# Patient Record
Sex: Female | Born: 1940
Health system: Southern US, Community
[De-identification: ages and names within clinical notes are randomized; demographics above are authoritative.]

## PROBLEM LIST (undated history)

## (undated) ENCOUNTER — Emergency Department: Admission: EM | Discharge status: Absent without leave | Payer: Medicare PPO

## (undated) DIAGNOSIS — N6019 Diffuse cystic mastopathy of unspecified breast: Secondary | ICD-10-CM

## (undated) DIAGNOSIS — Z803 Family history of malignant neoplasm of breast: Secondary | ICD-10-CM

## (undated) DIAGNOSIS — N189 Chronic kidney disease, unspecified: Secondary | ICD-10-CM

## (undated) DIAGNOSIS — E785 Hyperlipidemia, unspecified: Secondary | ICD-10-CM

## (undated) DIAGNOSIS — G629 Polyneuropathy, unspecified: Secondary | ICD-10-CM

## (undated) DIAGNOSIS — R011 Cardiac murmur, unspecified: Secondary | ICD-10-CM

## (undated) DIAGNOSIS — M810 Age-related osteoporosis without current pathological fracture: Secondary | ICD-10-CM

## (undated) DIAGNOSIS — Z87442 Personal history of urinary calculi: Secondary | ICD-10-CM

## (undated) DIAGNOSIS — T8859XA Other complications of anesthesia, initial encounter: Secondary | ICD-10-CM

## (undated) DIAGNOSIS — I341 Nonrheumatic mitral (valve) prolapse: Secondary | ICD-10-CM

## (undated) DIAGNOSIS — R112 Nausea with vomiting, unspecified: Secondary | ICD-10-CM

## (undated) DIAGNOSIS — Z808 Family history of malignant neoplasm of other organs or systems: Secondary | ICD-10-CM

## (undated) DIAGNOSIS — Z8042 Family history of malignant neoplasm of prostate: Secondary | ICD-10-CM

## (undated) DIAGNOSIS — I499 Cardiac arrhythmia, unspecified: Secondary | ICD-10-CM

## (undated) DIAGNOSIS — M545 Low back pain, unspecified: Secondary | ICD-10-CM

## (undated) DIAGNOSIS — K589 Irritable bowel syndrome without diarrhea: Secondary | ICD-10-CM

## (undated) DIAGNOSIS — M19019 Primary osteoarthritis, unspecified shoulder: Secondary | ICD-10-CM

## (undated) DIAGNOSIS — C801 Malignant (primary) neoplasm, unspecified: Secondary | ICD-10-CM

## (undated) DIAGNOSIS — Z95810 Presence of automatic (implantable) cardiac defibrillator: Secondary | ICD-10-CM

## (undated) DIAGNOSIS — R519 Headache, unspecified: Secondary | ICD-10-CM

## (undated) DIAGNOSIS — Z9889 Other specified postprocedural states: Secondary | ICD-10-CM

## (undated) DIAGNOSIS — Z800 Family history of malignant neoplasm of digestive organs: Secondary | ICD-10-CM

## (undated) DIAGNOSIS — I10 Essential (primary) hypertension: Secondary | ICD-10-CM

## (undated) DIAGNOSIS — I4891 Unspecified atrial fibrillation: Secondary | ICD-10-CM

## (undated) DIAGNOSIS — G4733 Obstructive sleep apnea (adult) (pediatric): Secondary | ICD-10-CM

## (undated) DIAGNOSIS — K219 Gastro-esophageal reflux disease without esophagitis: Secondary | ICD-10-CM

## (undated) DIAGNOSIS — G8929 Other chronic pain: Secondary | ICD-10-CM

## (undated) HISTORY — DX: Gastro-esophageal reflux disease without esophagitis: K21.9

## (undated) HISTORY — PX: ABDOMINAL HYSTERECTOMY: SHX81

## (undated) HISTORY — DX: Family history of malignant neoplasm of other organs or systems: Z80.8

## (undated) HISTORY — DX: Polyneuropathy, unspecified: G62.9

## (undated) HISTORY — DX: Presence of automatic (implantable) cardiac defibrillator: Z95.810

## (undated) HISTORY — DX: Diffuse cystic mastopathy of unspecified breast: N60.19

## (undated) HISTORY — DX: Family history of malignant neoplasm of prostate: Z80.42

## (undated) HISTORY — DX: Family history of malignant neoplasm of breast: Z80.3

## (undated) HISTORY — DX: Hyperlipidemia, unspecified: E78.5

## (undated) HISTORY — DX: Essential (primary) hypertension: I10

## (undated) HISTORY — DX: Chronic kidney disease, unspecified: N18.9

## (undated) HISTORY — DX: Irritable bowel syndrome, unspecified: K58.9

## (undated) HISTORY — PX: CARDIAC ELECTROPHYSIOLOGY MAPPING AND ABLATION: SHX1292

## (undated) HISTORY — DX: Age-related osteoporosis without current pathological fracture: M81.0

## (undated) HISTORY — DX: Obstructive sleep apnea (adult) (pediatric): G47.33

## (undated) HISTORY — PX: EYE SURGERY: SHX253

## (undated) HISTORY — DX: Low back pain, unspecified: M54.50

## (undated) HISTORY — PX: TOTAL HIP ARTHROPLASTY: SHX124

## (undated) HISTORY — DX: Other chronic pain: G89.29

## (undated) HISTORY — DX: Primary osteoarthritis, unspecified shoulder: M19.019

## (undated) HISTORY — PX: BACK SURGERY: SHX140

## (undated) HISTORY — DX: Family history of malignant neoplasm of digestive organs: Z80.0

## (undated) HISTORY — PX: HYSTEROTOMY: SHX1776

---
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
## 1999-01-16 ENCOUNTER — Encounter: Admission: RE | Admit: 1999-01-16 | Discharge: 1999-04-16 | Payer: Self-pay | Admitting: Anesthesiology

## 1999-10-05 HISTORY — PX: BREAST CYST ASPIRATION: SHX578

## 2004-07-07 ENCOUNTER — Encounter: Payer: Self-pay | Admitting: Pain Medicine

## 2004-07-21 ENCOUNTER — Ambulatory Visit: Payer: Self-pay | Admitting: Physician Assistant

## 2004-07-30 ENCOUNTER — Ambulatory Visit: Payer: Self-pay | Admitting: Pain Medicine

## 2004-08-04 ENCOUNTER — Encounter: Payer: Self-pay | Admitting: Pain Medicine

## 2004-08-20 ENCOUNTER — Ambulatory Visit: Payer: Self-pay | Admitting: Physician Assistant

## 2004-09-22 ENCOUNTER — Ambulatory Visit: Payer: Self-pay | Admitting: Pain Medicine

## 2004-10-13 ENCOUNTER — Ambulatory Visit: Payer: Self-pay | Admitting: Physician Assistant

## 2004-11-19 ENCOUNTER — Ambulatory Visit: Payer: Self-pay | Admitting: Internal Medicine

## 2004-11-19 ENCOUNTER — Ambulatory Visit: Payer: Self-pay | Admitting: Physician Assistant

## 2004-11-23 ENCOUNTER — Encounter: Payer: Self-pay | Admitting: Physician Assistant

## 2004-12-02 ENCOUNTER — Encounter: Payer: Self-pay | Admitting: Physician Assistant

## 2004-12-15 ENCOUNTER — Ambulatory Visit: Payer: Self-pay | Admitting: Physician Assistant

## 2005-01-13 ENCOUNTER — Ambulatory Visit: Payer: Self-pay | Admitting: Physician Assistant

## 2005-02-16 ENCOUNTER — Ambulatory Visit: Payer: Self-pay | Admitting: Physician Assistant

## 2005-03-18 ENCOUNTER — Ambulatory Visit: Payer: Self-pay | Admitting: Physician Assistant

## 2005-03-22 ENCOUNTER — Ambulatory Visit: Payer: Self-pay | Admitting: Unknown Physician Specialty

## 2005-04-14 ENCOUNTER — Ambulatory Visit: Payer: Self-pay | Admitting: Physician Assistant

## 2005-05-12 ENCOUNTER — Ambulatory Visit: Payer: Self-pay | Admitting: Unknown Physician Specialty

## 2005-05-14 ENCOUNTER — Ambulatory Visit: Payer: Self-pay | Admitting: Physician Assistant

## 2005-06-09 ENCOUNTER — Ambulatory Visit: Payer: Self-pay | Admitting: Physician Assistant

## 2005-07-12 ENCOUNTER — Ambulatory Visit: Payer: Self-pay | Admitting: Physician Assistant

## 2005-08-12 ENCOUNTER — Ambulatory Visit: Payer: Self-pay | Admitting: Physician Assistant

## 2005-09-10 ENCOUNTER — Ambulatory Visit: Payer: Self-pay | Admitting: Physician Assistant

## 2005-10-11 ENCOUNTER — Ambulatory Visit: Payer: Self-pay | Admitting: Physician Assistant

## 2005-11-08 ENCOUNTER — Ambulatory Visit: Payer: Self-pay | Admitting: Physician Assistant

## 2005-12-07 ENCOUNTER — Ambulatory Visit: Payer: Self-pay | Admitting: Physician Assistant

## 2005-12-08 ENCOUNTER — Ambulatory Visit: Payer: Self-pay | Admitting: Cardiology

## 2005-12-21 ENCOUNTER — Ambulatory Visit: Payer: Self-pay | Admitting: Internal Medicine

## 2006-01-04 ENCOUNTER — Ambulatory Visit: Payer: Self-pay | Admitting: Physician Assistant

## 2006-02-03 ENCOUNTER — Ambulatory Visit: Payer: Self-pay | Admitting: Physician Assistant

## 2006-03-07 ENCOUNTER — Ambulatory Visit: Payer: Self-pay | Admitting: Physician Assistant

## 2006-04-07 ENCOUNTER — Ambulatory Visit: Payer: Self-pay | Admitting: Physician Assistant

## 2006-05-05 ENCOUNTER — Ambulatory Visit: Payer: Self-pay | Admitting: Physician Assistant

## 2006-05-24 ENCOUNTER — Ambulatory Visit: Payer: Self-pay | Admitting: Physician Assistant

## 2006-06-20 ENCOUNTER — Ambulatory Visit: Payer: Self-pay | Admitting: Physician Assistant

## 2006-07-19 ENCOUNTER — Ambulatory Visit: Payer: Self-pay | Admitting: Physician Assistant

## 2006-08-18 ENCOUNTER — Ambulatory Visit: Payer: Self-pay | Admitting: Physician Assistant

## 2006-09-14 ENCOUNTER — Ambulatory Visit: Payer: Self-pay | Admitting: Physician Assistant

## 2006-10-17 ENCOUNTER — Ambulatory Visit: Payer: Self-pay | Admitting: Physician Assistant

## 2006-11-16 ENCOUNTER — Ambulatory Visit: Payer: Self-pay | Admitting: Physician Assistant

## 2006-12-15 ENCOUNTER — Ambulatory Visit: Payer: Self-pay | Admitting: Physician Assistant

## 2006-12-27 ENCOUNTER — Ambulatory Visit: Payer: Self-pay | Admitting: Internal Medicine

## 2007-01-13 ENCOUNTER — Ambulatory Visit: Payer: Self-pay | Admitting: Physician Assistant

## 2007-02-15 ENCOUNTER — Ambulatory Visit: Payer: Self-pay | Admitting: Physician Assistant

## 2007-03-13 ENCOUNTER — Ambulatory Visit: Payer: Self-pay | Admitting: Physician Assistant

## 2007-04-17 ENCOUNTER — Ambulatory Visit: Payer: Self-pay | Admitting: Physician Assistant

## 2007-05-16 ENCOUNTER — Ambulatory Visit: Payer: Self-pay | Admitting: Physician Assistant

## 2007-06-14 ENCOUNTER — Ambulatory Visit: Payer: Self-pay | Admitting: Physician Assistant

## 2007-07-11 ENCOUNTER — Ambulatory Visit: Payer: Self-pay | Admitting: Physician Assistant

## 2007-08-14 ENCOUNTER — Ambulatory Visit: Payer: Self-pay | Admitting: Physician Assistant

## 2007-09-14 ENCOUNTER — Ambulatory Visit: Payer: Self-pay | Admitting: Physician Assistant

## 2007-12-14 ENCOUNTER — Ambulatory Visit: Payer: Self-pay | Admitting: Physician Assistant

## 2008-01-16 ENCOUNTER — Ambulatory Visit: Payer: Self-pay | Admitting: Gastroenterology

## 2008-01-23 ENCOUNTER — Ambulatory Visit: Payer: Self-pay | Admitting: Internal Medicine

## 2008-03-12 ENCOUNTER — Ambulatory Visit: Payer: Self-pay | Admitting: Pain Medicine

## 2008-03-12 ENCOUNTER — Ambulatory Visit: Payer: Self-pay | Admitting: Physician Assistant

## 2008-04-02 ENCOUNTER — Ambulatory Visit: Payer: Self-pay | Admitting: Physician Assistant

## 2008-04-09 ENCOUNTER — Ambulatory Visit: Payer: Self-pay | Admitting: Pain Medicine

## 2008-04-29 ENCOUNTER — Ambulatory Visit: Payer: Self-pay | Admitting: Pain Medicine

## 2008-05-02 ENCOUNTER — Ambulatory Visit: Payer: Self-pay | Admitting: Pain Medicine

## 2008-05-06 ENCOUNTER — Ambulatory Visit: Payer: Self-pay | Admitting: Pain Medicine

## 2008-05-20 ENCOUNTER — Other Ambulatory Visit: Payer: Self-pay

## 2008-05-20 ENCOUNTER — Ambulatory Visit: Payer: Self-pay | Admitting: Ophthalmology

## 2008-05-27 ENCOUNTER — Ambulatory Visit: Payer: Self-pay | Admitting: Pain Medicine

## 2008-05-28 ENCOUNTER — Ambulatory Visit: Payer: Self-pay | Admitting: Ophthalmology

## 2008-06-19 ENCOUNTER — Ambulatory Visit: Payer: Self-pay | Admitting: Internal Medicine

## 2008-09-03 ENCOUNTER — Ambulatory Visit: Payer: Self-pay | Admitting: Physician Assistant

## 2008-11-08 ENCOUNTER — Ambulatory Visit: Payer: Self-pay | Admitting: Cardiovascular Disease

## 2008-12-05 ENCOUNTER — Ambulatory Visit: Payer: Self-pay | Admitting: Physician Assistant

## 2008-12-18 ENCOUNTER — Ambulatory Visit: Payer: Self-pay | Admitting: Internal Medicine

## 2009-01-27 ENCOUNTER — Ambulatory Visit: Payer: Self-pay | Admitting: Internal Medicine

## 2009-03-06 ENCOUNTER — Ambulatory Visit: Payer: Self-pay | Admitting: Physician Assistant

## 2009-06-05 ENCOUNTER — Ambulatory Visit: Payer: Self-pay | Admitting: Physician Assistant

## 2009-06-25 ENCOUNTER — Ambulatory Visit: Payer: Self-pay | Admitting: Physician Assistant

## 2009-07-01 ENCOUNTER — Ambulatory Visit: Payer: Self-pay | Admitting: Pain Medicine

## 2009-07-24 ENCOUNTER — Ambulatory Visit: Payer: Self-pay | Admitting: Physician Assistant

## 2009-09-15 ENCOUNTER — Ambulatory Visit: Payer: Self-pay | Admitting: General Practice

## 2009-09-15 ENCOUNTER — Ambulatory Visit: Payer: Self-pay | Admitting: Cardiovascular Disease

## 2009-09-29 ENCOUNTER — Inpatient Hospital Stay: Payer: Self-pay | Admitting: General Practice

## 2009-11-05 ENCOUNTER — Ambulatory Visit: Payer: Self-pay | Admitting: Pain Medicine

## 2010-01-29 ENCOUNTER — Ambulatory Visit: Payer: Self-pay | Admitting: Internal Medicine

## 2010-02-05 ENCOUNTER — Ambulatory Visit: Payer: Self-pay | Admitting: Pain Medicine

## 2010-12-09 ENCOUNTER — Ambulatory Visit: Payer: Self-pay | Admitting: Gastroenterology

## 2011-01-15 ENCOUNTER — Ambulatory Visit: Payer: Self-pay | Admitting: Internal Medicine

## 2011-09-09 ENCOUNTER — Ambulatory Visit: Payer: Self-pay | Admitting: Internal Medicine

## 2011-10-14 ENCOUNTER — Ambulatory Visit: Payer: Self-pay | Admitting: Internal Medicine

## 2012-01-25 ENCOUNTER — Ambulatory Visit: Payer: Self-pay | Admitting: Internal Medicine

## 2012-03-20 ENCOUNTER — Ambulatory Visit: Payer: Self-pay | Admitting: Ophthalmology

## 2012-03-20 LAB — POTASSIUM: Potassium: 4.1 mmol/L (ref 3.5–5.1)

## 2012-04-04 ENCOUNTER — Ambulatory Visit: Payer: Self-pay | Admitting: Ophthalmology

## 2012-04-19 DIAGNOSIS — I10 Essential (primary) hypertension: Secondary | ICD-10-CM

## 2012-07-14 ENCOUNTER — Encounter: Payer: Self-pay | Admitting: Neurology

## 2012-08-04 ENCOUNTER — Encounter: Payer: Self-pay | Admitting: Neurology

## 2013-01-25 ENCOUNTER — Ambulatory Visit: Payer: Self-pay | Admitting: Internal Medicine

## 2013-02-08 ENCOUNTER — Ambulatory Visit: Payer: Self-pay | Admitting: Registered Nurse

## 2013-02-08 LAB — CREATININE, SERUM: EGFR (Non-African Amer.): >60

## 2013-02-21 DIAGNOSIS — C4360 Malignant melanoma of unspecified upper limb, including shoulder: Secondary | ICD-10-CM | POA: Insufficient documentation

## 2014-01-08 DIAGNOSIS — G8929 Other chronic pain: Secondary | ICD-10-CM | POA: Insufficient documentation

## 2014-01-08 DIAGNOSIS — M169 Osteoarthritis of hip, unspecified: Secondary | ICD-10-CM | POA: Insufficient documentation

## 2014-01-08 DIAGNOSIS — M545 Low back pain: Secondary | ICD-10-CM

## 2014-01-08 DIAGNOSIS — M1610 Unilateral primary osteoarthritis, unspecified hip: Secondary | ICD-10-CM | POA: Insufficient documentation

## 2014-01-28 ENCOUNTER — Ambulatory Visit: Payer: Self-pay | Admitting: Internal Medicine

## 2014-02-28 ENCOUNTER — Ambulatory Visit: Payer: Self-pay | Admitting: Gastroenterology

## 2014-03-01 LAB — PATHOLOGY REPORT

## 2014-03-13 DIAGNOSIS — G8929 Other chronic pain: Secondary | ICD-10-CM | POA: Insufficient documentation

## 2014-03-13 DIAGNOSIS — R51 Headache: Secondary | ICD-10-CM

## 2014-03-13 DIAGNOSIS — R200 Anesthesia of skin: Secondary | ICD-10-CM | POA: Insufficient documentation

## 2014-03-13 DIAGNOSIS — R202 Paresthesia of skin: Secondary | ICD-10-CM

## 2014-03-13 DIAGNOSIS — M79604 Pain in right leg: Secondary | ICD-10-CM

## 2014-03-13 DIAGNOSIS — M79605 Pain in left leg: Secondary | ICD-10-CM

## 2014-03-13 DIAGNOSIS — G4486 Cervicogenic headache: Secondary | ICD-10-CM | POA: Insufficient documentation

## 2014-07-18 DIAGNOSIS — E669 Obesity, unspecified: Secondary | ICD-10-CM | POA: Insufficient documentation

## 2014-07-18 DIAGNOSIS — G44229 Chronic tension-type headache, not intractable: Secondary | ICD-10-CM | POA: Insufficient documentation

## 2014-07-18 DIAGNOSIS — R262 Difficulty in walking, not elsewhere classified: Secondary | ICD-10-CM | POA: Insufficient documentation

## 2014-07-18 DIAGNOSIS — G629 Polyneuropathy, unspecified: Secondary | ICD-10-CM | POA: Insufficient documentation

## 2015-01-17 ENCOUNTER — Other Ambulatory Visit: Payer: Self-pay | Admitting: Nurse Practitioner

## 2015-01-17 ENCOUNTER — Other Ambulatory Visit (HOSPITAL_BASED_OUTPATIENT_CLINIC_OR_DEPARTMENT_OTHER): Payer: Self-pay | Admitting: Nurse Practitioner

## 2015-01-17 DIAGNOSIS — Z139 Encounter for screening, unspecified: Secondary | ICD-10-CM

## 2015-01-26 NOTE — Op Note (Signed)
PATIENT NAME:  Sarah Riddle, Sarah Riddle MR#:  [id number] DATE OF BIRTH:  [birth year]  DATE OF PROCEDURE:  04/04/2012  PREOPERATIVE DIAGNOSIS: Visually significant cataract of the left eye.   POSTOPERATIVE DIAGNOSIS: Visually significant cataract of the left eye.   OPERATIVE PROCEDURE: Cataract extraction by phacoemulsification with implant of intraocular lens to left eye.   SURGEON: Birder Robson, MD.   ANESTHESIA:  1. Managed anesthesia care.  2. Topical tetracaine drops followed by 2% Xylocaine jelly applied in the preoperative holding area.   COMPLICATIONS: None.   TECHNIQUE:  Stop-and-chop    DESCRIPTION OF PROCEDURE: The patient was examined and consented in the preoperative holding area where the aforementioned topical anesthesia was applied to the left eye and then brought back to the Operating Room where the left eye was prepped and draped in the usual sterile ophthalmic fashion and a lid speculum was placed. A paracentesis was created with the side port blade and the anterior chamber was filled with viscoelastic. A near clear corneal incision was performed with the steel keratome. A continuous curvilinear capsulorrhexis was performed with a cystotome followed by the capsulorrhexis forceps. Hydrodissection and hydrodelineation were carried out with BSS on a blunt cannula. The lens was removed in a stop-and-chop technique and the remaining cortical material was removed with the irrigation-aspiration handpiece. The capsular bag was inflated with viscoelastic and the Alcon SN60WF 22.0-diopter lens, serial number [id number] was placed in the capsular bag without complication. The remaining viscoelastic was removed from the eye with the irrigation-aspiration handpiece. The wounds were hydrated. The anterior chamber was flushed with Miostat and the eye was inflated to physiologic pressure. The wounds were found to be water tight. The eye was dressed with Vigamox. The patient was given protective  glasses to wear throughout the day and a shield with which to sleep tonight. The patient was also given drops with which to begin a drop regimen today and will follow-up with me in one day.   ____________________________ Livingston Diones. Kenadie Royce, MD wlp:drc D: 04/04/2012 12:29:42 ET T: 04/04/2012 12:51:05 ET JOB#: [id number]  cc: Kearie Mennen L. Elzada Pytel, MD, <Dictator> Livingston Diones Geoffrey Hynes MD ELECTRONICALLY SIGNED 04/12/2012 9:58

## 2015-02-04 ENCOUNTER — Ambulatory Visit
Admission: RE | Admit: 2015-02-04 | Discharge: 2015-02-04 | Disposition: A | Discharge status: Home / Self Care | Payer: Medicare PPO | Source: Ambulatory Visit | Attending: Nurse Practitioner | Admitting: Nurse Practitioner

## 2015-02-04 ENCOUNTER — Other Ambulatory Visit: Payer: Self-pay | Admitting: Nurse Practitioner

## 2015-02-04 DIAGNOSIS — Z139 Encounter for screening, unspecified: Secondary | ICD-10-CM

## 2015-02-04 DIAGNOSIS — Z1231 Encounter for screening mammogram for malignant neoplasm of breast: Secondary | ICD-10-CM | POA: Insufficient documentation

## 2015-02-04 HISTORY — DX: Malignant (primary) neoplasm, unspecified: C80.1

## 2015-02-05 ENCOUNTER — Other Ambulatory Visit: Payer: Self-pay | Admitting: Internal Medicine

## 2015-02-05 DIAGNOSIS — R928 Other abnormal and inconclusive findings on diagnostic imaging of breast: Secondary | ICD-10-CM

## 2015-02-06 ENCOUNTER — Other Ambulatory Visit: Payer: Self-pay | Admitting: Nurse Practitioner

## 2015-02-06 DIAGNOSIS — R590 Localized enlarged lymph nodes: Secondary | ICD-10-CM

## 2015-02-06 DIAGNOSIS — R928 Other abnormal and inconclusive findings on diagnostic imaging of breast: Secondary | ICD-10-CM

## 2015-02-10 ENCOUNTER — Ambulatory Visit
Admission: RE | Admit: 2015-02-10 | Discharge: 2015-02-10 | Disposition: A | Discharge status: Home / Self Care | Payer: Medicare PPO | Source: Ambulatory Visit | Attending: Nurse Practitioner | Admitting: Nurse Practitioner

## 2015-02-10 DIAGNOSIS — R590 Localized enlarged lymph nodes: Secondary | ICD-10-CM

## 2015-02-10 DIAGNOSIS — R928 Other abnormal and inconclusive findings on diagnostic imaging of breast: Secondary | ICD-10-CM | POA: Diagnosis present

## 2015-10-29 DIAGNOSIS — M79605 Pain in left leg: Secondary | ICD-10-CM

## 2015-10-29 DIAGNOSIS — M79604 Pain in right leg: Secondary | ICD-10-CM | POA: Insufficient documentation

## 2015-12-31 ENCOUNTER — Other Ambulatory Visit: Payer: Self-pay | Admitting: Nurse Practitioner

## 2015-12-31 DIAGNOSIS — Z1231 Encounter for screening mammogram for malignant neoplasm of breast: Secondary | ICD-10-CM

## 2016-01-15 DIAGNOSIS — G4733 Obstructive sleep apnea (adult) (pediatric): Secondary | ICD-10-CM | POA: Insufficient documentation

## 2016-01-15 DIAGNOSIS — J309 Allergic rhinitis, unspecified: Secondary | ICD-10-CM | POA: Insufficient documentation

## 2016-01-15 DIAGNOSIS — M810 Age-related osteoporosis without current pathological fracture: Secondary | ICD-10-CM | POA: Insufficient documentation

## 2016-01-15 DIAGNOSIS — I10 Essential (primary) hypertension: Secondary | ICD-10-CM | POA: Insufficient documentation

## 2016-02-05 ENCOUNTER — Ambulatory Visit
Admission: RE | Admit: 2016-02-05 | Discharge: 2016-02-05 | Disposition: A | Discharge status: Home / Self Care | Payer: Medicare PPO | Source: Ambulatory Visit | Attending: Nurse Practitioner | Admitting: Nurse Practitioner

## 2016-02-05 ENCOUNTER — Other Ambulatory Visit: Payer: Self-pay | Admitting: Nurse Practitioner

## 2016-02-05 DIAGNOSIS — Z1231 Encounter for screening mammogram for malignant neoplasm of breast: Secondary | ICD-10-CM | POA: Diagnosis not present

## 2016-02-06 IMAGING — US US BREAST*R* LIMITED INC AXILLA
1 series · 9 of 9 positions shown · non-contrast
Comparison: Previous exams.

CLINICAL DATA: Patient presents for a right axillary ultrasound to
evaluate a axillary lymph node seen on recent screening mammogram.
History of right upper extremity melanoma 5510 with recurrence 9195.
Negative right axillary lymph node biopsy 9195.

EXAM:
ULTRASOUND OF THE right AXILLA

[Series 1: us breast*right* limited inc axilla · 0.12mm/px · 9 of 9 slices shown]
[im 1/9]
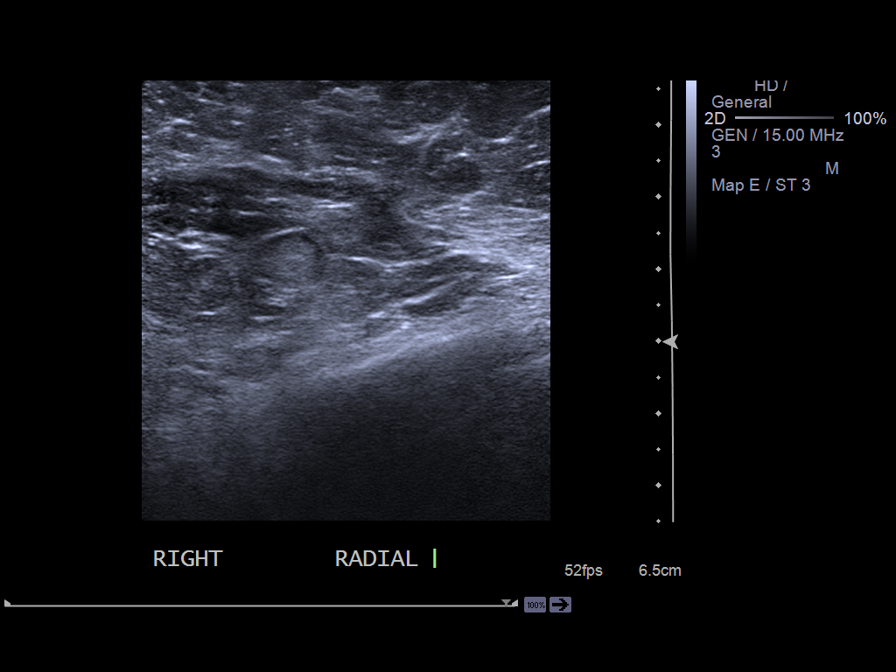
[im 2/9]
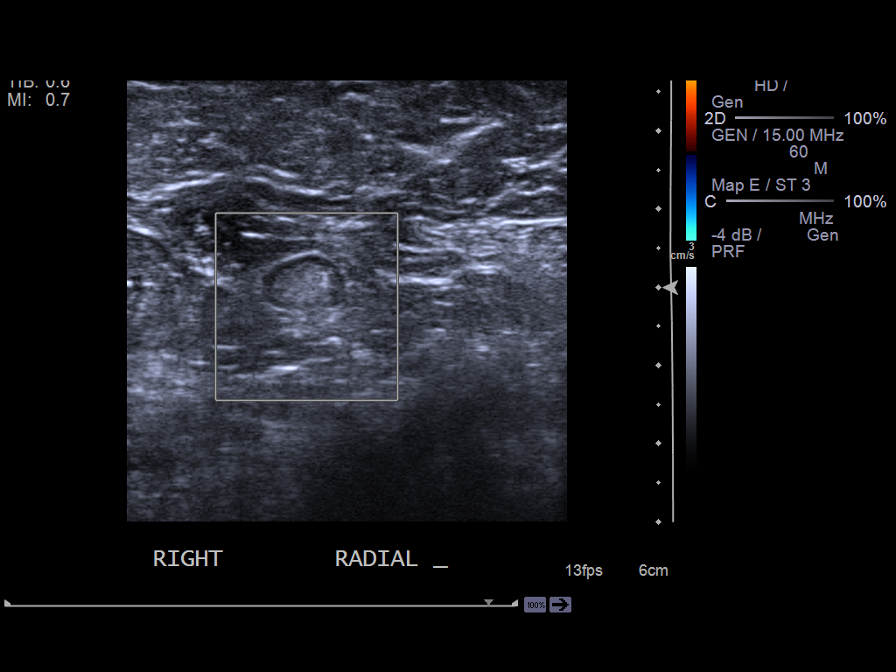
[im 3/9]
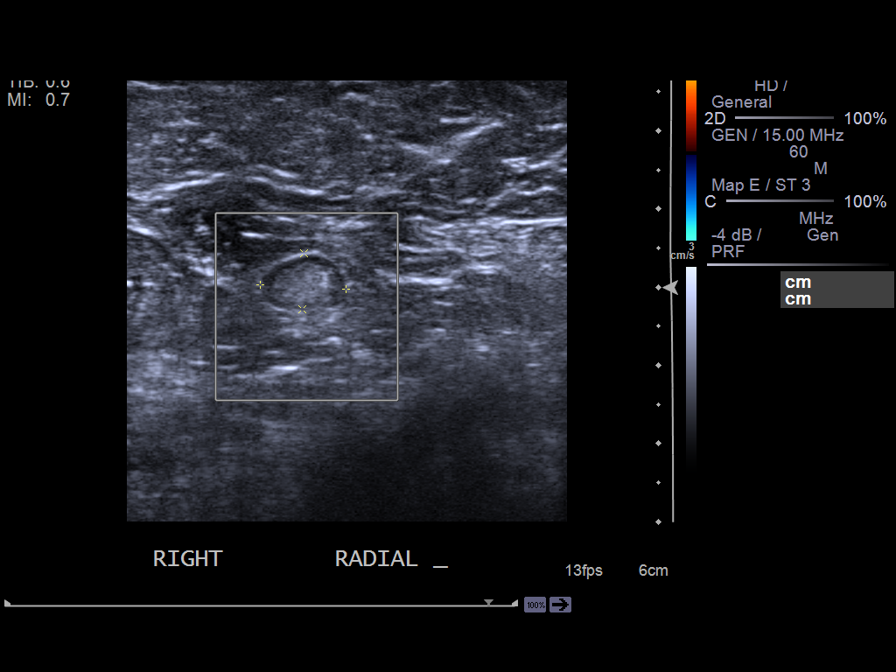
[im 4/9]
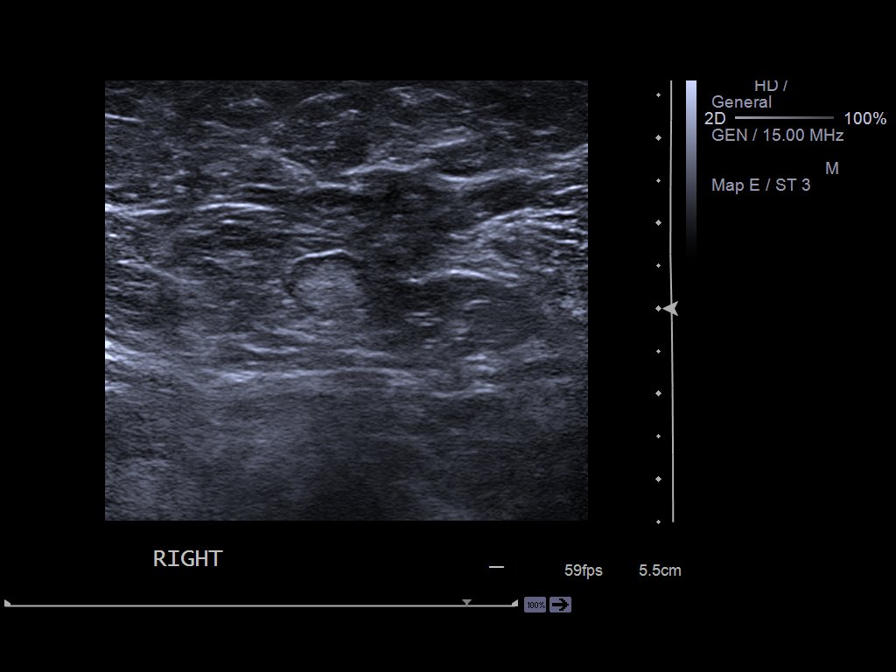
[im 5/9]
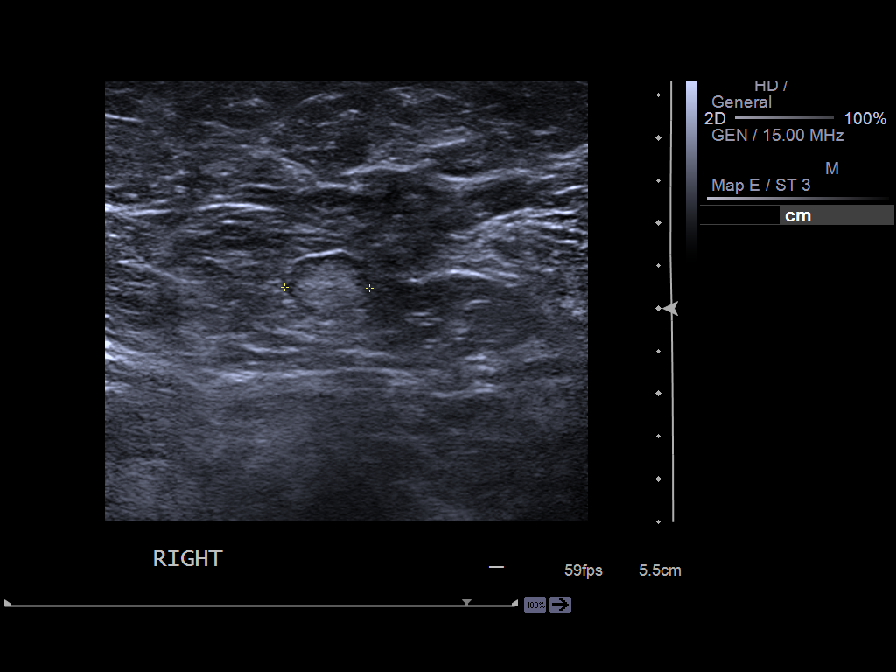
[im 6/9]
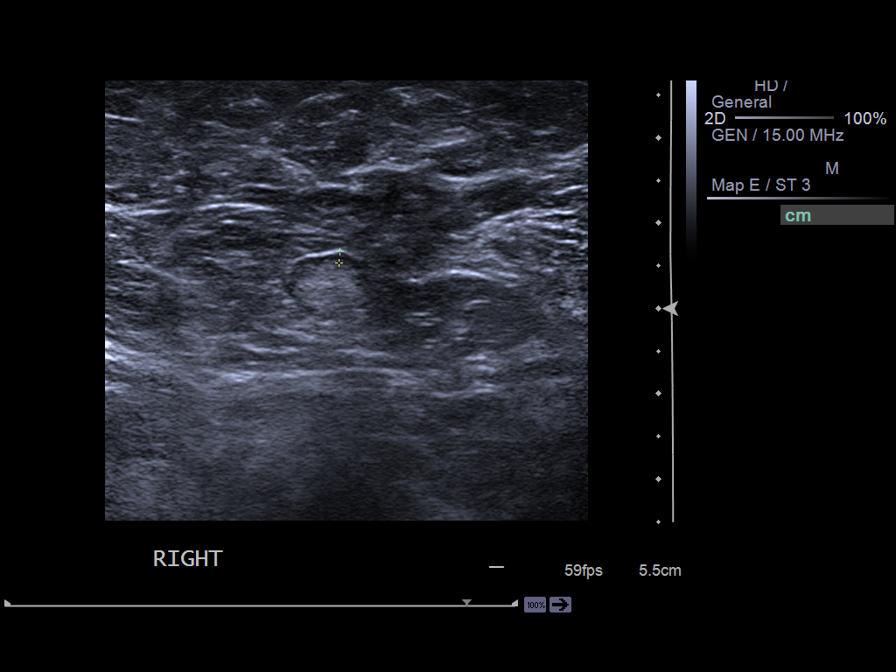
[im 7/9]
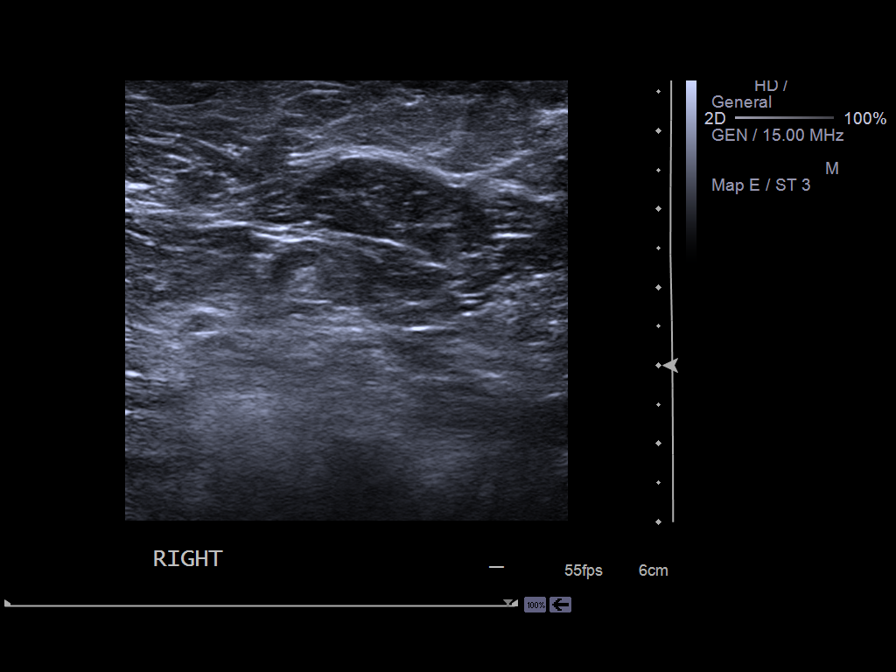
[im 8/9]
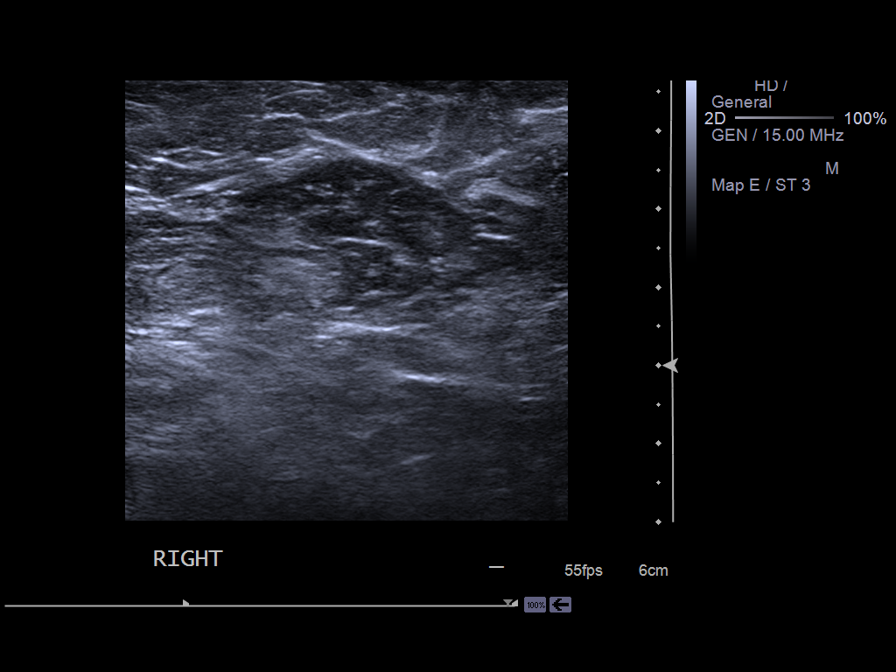
[im 9/9]
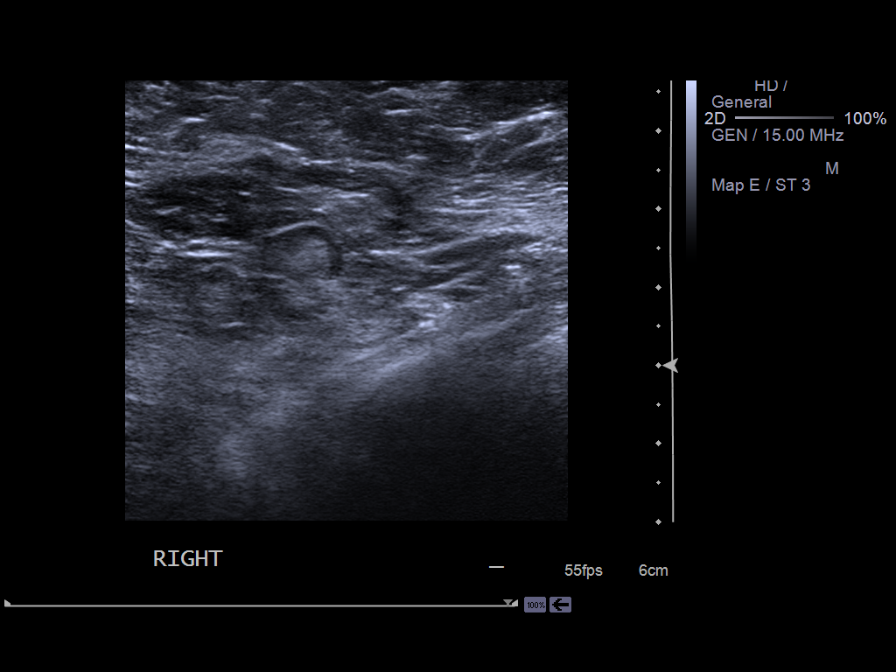

[9 of 9 positions shown; findings below may reference images not displayed]

FINDINGS: Ultrasound is performed, showing a lymph node over the right
axilla/axillary tail measuring 1.1 cm in diameter with normal
morphology and thin echogenic cortex. This likely represents the
lymph node seen mammographically. There is a smaller adjacent normal
appearing axillary lymph node. Several other small normal right
axillary lymph nodes are visualized.
IMPRESSION: Normal right axillary lymph nodes.

RECOMMENDATION:
Recommend continued annual bilateral screening mammographic
followup.

I have discussed the findings and recommendations with the patient.
Results were also provided in writing at the conclusion of the
visit. If applicable, a reminder letter will be sent to the patient
regarding the next appointment.

BI-RADS CATEGORY  2: Benign.

## 2017-01-04 ENCOUNTER — Other Ambulatory Visit: Payer: Self-pay | Admitting: Nurse Practitioner

## 2017-01-04 DIAGNOSIS — Z1231 Encounter for screening mammogram for malignant neoplasm of breast: Secondary | ICD-10-CM

## 2017-02-08 ENCOUNTER — Ambulatory Visit
Admission: RE | Admit: 2017-02-08 | Discharge: 2017-02-08 | Disposition: A | Discharge status: Home / Self Care | Payer: Medicare PPO | Source: Ambulatory Visit | Attending: Nurse Practitioner | Admitting: Nurse Practitioner

## 2017-02-08 DIAGNOSIS — Z1231 Encounter for screening mammogram for malignant neoplasm of breast: Secondary | ICD-10-CM | POA: Insufficient documentation

## 2017-02-09 ENCOUNTER — Other Ambulatory Visit: Payer: Self-pay | Admitting: Nurse Practitioner

## 2017-02-09 DIAGNOSIS — R928 Other abnormal and inconclusive findings on diagnostic imaging of breast: Secondary | ICD-10-CM

## 2017-02-09 DIAGNOSIS — N6489 Other specified disorders of breast: Secondary | ICD-10-CM

## 2017-02-21 DIAGNOSIS — M1712 Unilateral primary osteoarthritis, left knee: Secondary | ICD-10-CM | POA: Insufficient documentation

## 2017-02-22 ENCOUNTER — Ambulatory Visit
Admission: RE | Admit: 2017-02-22 | Discharge: 2017-02-22 | Disposition: A | Discharge status: Home / Self Care | Payer: Medicare PPO | Source: Ambulatory Visit | Attending: Nurse Practitioner | Admitting: Nurse Practitioner

## 2017-02-22 DIAGNOSIS — N6489 Other specified disorders of breast: Secondary | ICD-10-CM

## 2017-02-22 DIAGNOSIS — R928 Other abnormal and inconclusive findings on diagnostic imaging of breast: Secondary | ICD-10-CM

## 2017-08-25 ENCOUNTER — Emergency Department: Payer: Medicare PPO

## 2017-08-25 ENCOUNTER — Other Ambulatory Visit: Payer: Self-pay

## 2017-08-25 ENCOUNTER — Emergency Department
Admission: EM | Admit: 2017-08-25 | Discharge: 2017-08-26 | Disposition: A | Discharge status: Home / Self Care | Payer: Medicare PPO | Attending: Emergency Medicine | Admitting: Emergency Medicine

## 2017-08-25 DIAGNOSIS — Z7901 Long term (current) use of anticoagulants: Secondary | ICD-10-CM | POA: Insufficient documentation

## 2017-08-25 DIAGNOSIS — R05 Cough: Secondary | ICD-10-CM | POA: Diagnosis not present

## 2017-08-25 DIAGNOSIS — R531 Weakness: Secondary | ICD-10-CM | POA: Insufficient documentation

## 2017-08-25 DIAGNOSIS — I4891 Unspecified atrial fibrillation: Secondary | ICD-10-CM | POA: Insufficient documentation

## 2017-08-25 DIAGNOSIS — R509 Fever, unspecified: Secondary | ICD-10-CM

## 2017-08-25 DIAGNOSIS — Z79899 Other long term (current) drug therapy: Secondary | ICD-10-CM | POA: Diagnosis not present

## 2017-08-25 DIAGNOSIS — N3000 Acute cystitis without hematuria: Secondary | ICD-10-CM

## 2017-08-25 DIAGNOSIS — J40 Bronchitis, not specified as acute or chronic: Secondary | ICD-10-CM | POA: Insufficient documentation

## 2017-08-25 LAB — CBC WITH DIFFERENTIAL/PLATELET
BASOS ABS: 0 10*3/uL (ref 0–0.1)
BASOS PCT: 0 %
EOS ABS: 0.3 10*3/uL (ref 0–0.7)
EOS PCT: 3 %
HCT: 38.8 % (ref 35.0–47.0)
Hemoglobin: 13.1 g/dL (ref 12.0–16.0)
LYMPHS PCT: 23 %
Lymphs Abs: 2.2 10*3/uL (ref 1.0–3.6)
MCH: 30.2 pg (ref 26.0–34.0)
MCHC: 33.9 g/dL (ref 32.0–36.0)
MCV: 89 fL (ref 80.0–100.0)
Monocytes Absolute: 0.7 10*3/uL (ref 0.2–0.9)
Monocytes Relative: 7 %
Neutro Abs: 6.5 10*3/uL (ref 1.4–6.5)
Neutrophils Relative %: 67 %
PLATELETS: 212 10*3/uL (ref 150–440)
RBC: 4.35 MIL/uL (ref 3.80–5.20)
RDW: 13.4 % (ref 11.5–14.5)
WBC: 9.7 10*3/uL (ref 3.6–11.0)

## 2017-08-25 LAB — COMPREHENSIVE METABOLIC PANEL
ALT: 13 U/L — AB (ref 14–54)
AST: 18 U/L (ref 15–41)
Albumin: 3.7 g/dL (ref 3.5–5.0)
Alkaline Phosphatase: 76 U/L (ref 38–126)
Anion gap: 12 (ref 5–15)
BUN: 16 mg/dL (ref 6–20)
CALCIUM: 9.2 mg/dL (ref 8.9–10.3)
CHLORIDE: 99 mmol/L — AB (ref 101–111)
CO2: 27 mmol/L (ref 22–32)
Creatinine, Ser: 1.01 mg/dL — ABNORMAL HIGH (ref 0.44–1.00)
GFR, EST NON AFRICAN AMERICAN: 52 mL/min — AB (ref >60)
Glucose, Bld: 92 mg/dL (ref 65–99)
Potassium: 3.7 mmol/L (ref 3.5–5.1)
Sodium: 138 mmol/L (ref 135–145)
Total Bilirubin: 0.7 mg/dL (ref 0.3–1.2)
Total Protein: 6.7 g/dL (ref 6.5–8.1)

## 2017-08-25 LAB — LACTIC ACID, PLASMA: LACTIC ACID, VENOUS: 0.9 mmol/L (ref 0.5–1.9)

## 2017-08-25 LAB — TROPONIN I: TROPONIN I: 0.03 ng/mL — AB (ref <0.03)

## 2017-08-25 MED ORDER — IPRATROPIUM-ALBUTEROL 0.5-2.5 (3) MG/3ML IN SOLN
3.0000 mL | Freq: Once | RESPIRATORY_TRACT | Status: AC
  Administered 2017-08-25: 3 mL via RESPIRATORY_TRACT
  Filled 2017-08-25: qty 3

## 2017-08-25 MED ORDER — SODIUM CHLORIDE 0.9 % IV BOLUS (SEPSIS)
1000.0000 mL | Freq: Once | INTRAVENOUS | Status: AC
  Administered 2017-08-25: 1000 mL via INTRAVENOUS

## 2017-08-25 MED ORDER — ACETAMINOPHEN 500 MG PO TABS
1000.0000 mg | ORAL_TABLET | Freq: Once | ORAL | Status: AC
  Administered 2017-08-25: 1000 mg via ORAL
  Filled 2017-08-25: qty 2

## 2017-08-25 NOTE — ED Notes (Signed)
Date and time results received: 08/25/17 2320 (use smartphrase ".now" to insert current time)  Test: Troponin Critical Value: 0.03  Name of Provider Notified: Dr. Dahlia Client  Orders Received? Or Actions Taken?:

## 2017-08-25 NOTE — ED Provider Notes (Signed)
[facility] Emergency Department Provider Note ____________________________________________   First MD Initiated Contact with Patient 08/25/17 2151     (approximate)  I have reviewed the triage vital signs and the nursing notes.   HISTORY  Chief Complaint Fever and Weakness    HPI Sarah Riddle is a 76 y.o. female with past medical history as noted below who presents with fever over the last day, measured in the high 90s-100 at home, with associated generalized malaise and weakness, as well as with cough productive of green sputum.  Patient reports mild associated shortness of breath, but denies chest pain.  She denies dysuria or urinary frequency.  No nausea, vomiting, or diarrhea.  No sick contacts or other recent illness.  Patient states that she did have a flu vaccine done last week.   Past Medical History:  Diagnosis Date  . Cancer ([place])    Melanoma x 2 -- Lt Leg -1976 Rt Arm - 2014    There are no active problems to display for this patient.   Past Surgical History:  Procedure Laterality Date  . BREAST CYST ASPIRATION Left 2001   Negative    Prior to Admission medications   Medication Sig Start Date End Date Taking? Authorizing Provider  allopurinol (ZYLOPRIM) 100 MG tablet Take 100 mg by mouth daily. 06/22/17   [provider]  atorvastatin (LIPITOR) 40 MG tablet Take 40 mg by mouth daily. 07/14/17   [provider]  ezetimibe (ZETIA) 10 MG tablet Take 10 mg by mouth daily. 08/01/17   [provider]  fluticasone (FLONASE) 50 MCG/ACT nasal spray Place 2 sprays into both nostrils daily. 07/31/17   [provider]  furosemide (LASIX) 20 MG tablet Take 1 tablet by mouth daily. 07/13/17   [provider]  gabapentin (NEURONTIN) 800 MG tablet Take 1 tablet by mouth 3 (three) times daily. 08/15/17   [provider]  isosorbide mononitrate (IMDUR) 30 MG 24 hr tablet Take 1 tablet by mouth  daily. 06/20/17   [provider]  levocetirizine (XYZAL) 5 MG tablet Take 5 mg by mouth daily. 07/31/17   [provider]  losartan (COZAAR) 50 MG tablet Take 50 mg by mouth daily. 08/23/17   [provider]  nortriptyline (PAMELOR) 10 MG capsule Take 10 mg by mouth 2 (two) times daily. 08/15/17   [provider]  pantoprazole (PROTONIX) 40 MG tablet Take 40 mg by mouth daily. 08/22/17   [provider]  SOTALOL AF 80 MG TABS Take 80 mg by mouth 2 (two) times daily. 08/23/17   [provider]  traZODone (DESYREL) 150 MG tablet Take 150 mg by mouth at bedtime. 08/08/17   [provider]  warfarin (COUMADIN) 2 MG tablet Take 2 mg by mouth daily. 06/24/17   [provider]    Allergies Iodinated diagnostic agents; Ibuprofen; Lubiprostone; Sulfa antibiotics; Zolpidem; Boniva [ibandronic acid]; Celecoxib; Duloxetine; and Tetracyclines & related  Family History  Problem Relation Age of Onset  . Breast cancer Neg Hx     Social History Social History   Tobacco Use  . Smoking status: Never Smoker  . Smokeless tobacco: Never Used  Substance Use Topics  . Alcohol use: No    Frequency: Never  . Drug use: No    Review of Systems  Constitutional: Positive for fever Eyes: No redness. ENT: No sore throat. Cardiovascular: Denies chest pain. Respiratory: Positive for shortness of breath. Gastrointestinal: No nausea, no vomiting.  No diarrhea.  Genitourinary: Negative for dysuria.  Musculoskeletal: Negative for back pain. Skin: Negative for rash. Neurological: Negative for headaches, focal weakness or numbness.   ____________________________________________   PHYSICAL EXAM:  VITAL SIGNS: ED Triage Vitals  Enc Vitals Group     BP 08/25/17 2137 (!) 157/89     Pulse Rate 08/25/17 2137 (!) 143     Resp 08/25/17 2137 (!) 27     Temp 08/25/17 2137 (!) 101.2 F (38.4 C)     Temp Source 08/25/17 2137 Oral     SpO2  08/25/17 2137 95 %     Weight 08/25/17 2138 220 lb (99.8 kg)     Height 08/25/17 2138 5\' 11"  (1.803 m)     Head Circumference --      Peak Flow --      Pain Score --      Pain Loc --      Pain Edu? --      Excl. in [place]? --     Constitutional: Alert and oriented. Well appearing and in no acute distress. Eyes: Conjunctivae are normal.  Head: Atraumatic. Nose: No congestion/rhinnorhea. Mouth/Throat: Mucous membranes are somewhat dry.   Neck: Normal range of motion.  Cardiovascular: Tachycardic with irregularly irregular rhythm. Grossly normal heart sounds.  Good peripheral circulation. Respiratory: Normal respiratory effort.  No retractions.  Scattered wheezes bilaterally. Gastrointestinal: Soft and nontender. No distention.  Genitourinary: No CVA tenderness. Musculoskeletal: No lower extremity edema.  Extremities warm and well perfused.  Neurologic:  Normal speech and language. No gross focal neurologic deficits are appreciated.  Skin:  Skin is warm and dry. No rash noted. Psychiatric: Mood and affect are normal. Speech and behavior are normal.  ____________________________________________   LABS (all labs ordered are listed, but only abnormal results are displayed)  Labs Reviewed  COMPREHENSIVE METABOLIC PANEL - Abnormal; Notable for the following components:      Result Value   Chloride 99 (*)    Creatinine, Ser 1.01 (*)    ALT 13 (*)    GFR calc non Af Amer 52 (*)    All other components within normal limits  TROPONIN I - Abnormal; Notable for the following components:   Troponin I 0.03 (*)    All other components within normal limits  CULTURE, BLOOD (ROUTINE X 2)  CULTURE, BLOOD (ROUTINE X 2)  LACTIC ACID, PLASMA  CBC WITH DIFFERENTIAL/PLATELET  LACTIC ACID, PLASMA  URINALYSIS, COMPLETE (UACMP) WITH MICROSCOPIC  INFLUENZA PANEL BY PCR (TYPE A & B)   ____________________________________________  EKG  ED ECG REPORT I, Arta Silence, the attending physician,  personally viewed and interpreted this ECG.  Date: 08/25/2017 EKG Time: 2134 Rate: 133 Rhythm: Trial fibrillation QRS Axis: normal Intervals: normal ST/T Wave abnormalities: anterolateral ST segment flattening Narrative Interpretation: no evidence of acute ischemia; no recent EKG available for comparison  ____________________________________________  RADIOLOGY  CXR: no focal infiltrate  ____________________________________________   PROCEDURES  Procedure(s) performed: No    Critical Care performed: No ____________________________________________   INITIAL IMPRESSION / ASSESSMENT AND PLAN / ED COURSE  Pertinent labs & imaging results that were available during my care of the patient were reviewed by me and considered in my medical decision making (see chart for details).  76 year old female with past medical history as noted above, as well as atrial fibrillation, presents with fever for the last day, associated with malaise and weakness, as well as with productive cough.  No other significant associated symptoms.  Review of past medical records in [place]  is noncontributory.  On exam, patient is febrile and is tachycardic in rapid A. fib, but other vital signs are within normal limits.  She is relatively well-appearing and in no respiratory distress.  There are bilateral scattered wheezes, and the remainder the exam is unremarkable.  Differential includes pneumonia, bronchitis, viral syndrome, less likely UTI or other source of infection.  Lower suspicion for cardiac cause.  Plan for chest x-ray, infection/sepsis workup, fluids, antipyretic, albuterol neb, and reassess.    ----------------------------------------- 11:56 PM on 08/25/2017 -----------------------------------------  Chest x-ray shows no pneumonia or other acute findings.  Patient's lactate is within normal range, and her white blood cell count is not elevated.  Her troponin is minimally elevated, which is  likely related to atrial fibrillation and her high heart rate.  Her heart rate has improved somewhat.  Patient has not yet received antipyretic or albuterol.  At this time, she is still pending urinalysis, flu test, and will need a 3-hour repeat troponin.  If her vital signs improved further after the Tylenol and fluids, the remainder of her workup is reassuring, and she feels better, she could possibly go home.  Patient will be signed out to Dr. Dahlia Client pending the remainder of the workup and clinical reassessment.  ____________________________________________   FINAL CLINICAL IMPRESSION(S) / ED DIAGNOSES  Final diagnoses:  Bronchitis  Febrile illness  Atrial fibrillation, unspecified type ([place])      NEW MEDICATIONS STARTED DURING THIS VISIT:  This SmartLink is deprecated. Use AVSMEDLIST instead to display the medication list for a patient.   Note:  This document was prepared using Dragon voice recognition software and may include unintentional dictation errors.    Arta Silence, MD 08/25/17 2358

## 2017-08-25 NOTE — ED Triage Notes (Signed)
Pt arrived to [facility] from home via EMS with complaints of fever and weakness for the past two days. Pt stated that she took the flu shot about a week ago and prior to that she was not sick. EMS reported that that got a temp of 101.9 that was taken two hours after pt took 1,000 mg of Tylenol. Pt has a Hx of A-fib. VS per EMS BP-124/72 HR-110-150 BS-134 91% RA then pt was placed on 2L nasal Cannula per EMS. Pt received 200 cc NaCl. Pt has a 20 gauge in right AC. Pt is alert and oriented x 4. Pt denies pain.

## 2017-08-26 LAB — INFLUENZA PANEL BY PCR (TYPE A & B)
INFLBPCR: NEGATIVE
Influenza A By PCR: NEGATIVE

## 2017-08-26 LAB — URINALYSIS, COMPLETE (UACMP) WITH MICROSCOPIC
Bacteria, UA: NONE SEEN
Bilirubin Urine: NEGATIVE
Glucose, UA: NEGATIVE mg/dL
KETONES UR: NEGATIVE mg/dL
Nitrite: NEGATIVE
PROTEIN: NEGATIVE mg/dL
Specific Gravity, Urine: 1.014 (ref 1.005–1.030)
pH: 7 (ref 5.0–8.0)

## 2017-08-26 MED ORDER — SODIUM CHLORIDE 0.9 % IV BOLUS (SEPSIS)
500.0000 mL | Freq: Once | INTRAVENOUS | Status: AC
  Administered 2017-08-26: 500 mL via INTRAVENOUS

## 2017-08-26 MED ORDER — CEPHALEXIN 500 MG PO CAPS: 500.0000 mg | ORAL_CAPSULE | Freq: Four times a day (QID) | ORAL | 0 refills | Status: AC

## 2017-08-26 MED ORDER — CEFTRIAXONE SODIUM IN DEXTROSE 20 MG/ML IV SOLN
1.0000 g | Freq: Once | INTRAVENOUS | Status: AC
  Administered 2017-08-26: 1 g via INTRAVENOUS
  Filled 2017-08-26: qty 50

## 2017-08-26 MED ORDER — DILTIAZEM HCL 25 MG/5ML IV SOLN
10.0000 mg | Freq: Once | INTRAVENOUS | Status: AC
  Administered 2017-08-26: 10 mg via INTRAVENOUS
  Filled 2017-08-26: qty 5

## 2017-08-26 NOTE — Discharge Instructions (Signed)
Please follow up with your primary care physician.  It appears that you may have a urinary tract infection.  Please take the antibiotics as prescribed.  You also have an abnormal heart rhythm that he reports is not new.  Please ensure that you are controlling your fever and drinking adequate hydration.  Please return with any other concerns or any worsening conditions.

## 2017-08-26 NOTE — ED Provider Notes (Signed)
-----------------------------------------   1:56 AM on 08/26/2017 -----------------------------------------   Blood pressure 115/60, pulse 62, temperature 98.7 F (37.1 C), temperature source Oral, resp. rate 15, height 5\' 11"  (1.803 m), weight 99.8 kg (220 lb), SpO2 97 %.  Assuming care from Dr. Cherylann Banas.  In short, Sarah Riddle is a 76 y.o. female with a chief complaint of Fever and Weakness .  Refer to the original H&P for additional details.  The current plan of care is to follow up the results of the blood work and disposition the patient.     The patient's urinalysis returned with too numerous to count white blood cells with no bacteria.  The patient did have large leukocyte Estrace but 0-5 squamous epithelium and 0-5 9 squamous epithelium.  I feel that this is indicative of a urinary tract infection.  The patient did receive a second 500 mL bolus of normal saline and I also did give her a dose of diltiazem 10 mg IV.  I discussed the atrial fibrillation with the patient and she states that this has happened in the past.  She reports that she can usually feel when her heart goes into the abnormal rhythm.  Since this is not a new rhythm for the patient and she is feeling improved I feel that we are able to discharge her home.  Again her heart rate is improved into the 60s and her blood pressures in the 1 teens over 60 rate.  The patient did receive a dose of ceftriaxone for her urinary tract infection.  The patient's flu also returned negative.  I will discharge the patient and encouraged her to follow-up with her primary care physician.  She should return with any worsening concerns or conditions.  ED ECG REPORT I, Loney Hering, the attending physician, personally viewed and interpreted this ECG.   Date: 08/26/2017  EKG Time: 153  Rate: 67  Rhythm: normal sinus rhythm  Axis: normal  Intervals:none  ST&T Change: none      Loney Hering, MD 08/26/17 [phone]

## 2017-08-28 LAB — URINE CULTURE

## 2017-08-30 LAB — CULTURE, BLOOD (ROUTINE X 2)
Culture: NO GROWTH
Culture: NO GROWTH
Special Requests: ADEQUATE
Special Requests: ADEQUATE

## 2017-12-22 ENCOUNTER — Other Ambulatory Visit: Payer: Self-pay | Admitting: Nurse Practitioner

## 2017-12-22 DIAGNOSIS — Z1239 Encounter for other screening for malignant neoplasm of breast: Secondary | ICD-10-CM

## 2018-01-13 DIAGNOSIS — M7582 Other shoulder lesions, left shoulder: Secondary | ICD-10-CM | POA: Insufficient documentation

## 2018-01-13 DIAGNOSIS — M75122 Complete rotator cuff tear or rupture of left shoulder, not specified as traumatic: Secondary | ICD-10-CM | POA: Insufficient documentation

## 2018-01-13 DIAGNOSIS — M19012 Primary osteoarthritis, left shoulder: Secondary | ICD-10-CM | POA: Insufficient documentation

## 2018-02-23 ENCOUNTER — Ambulatory Visit
Admission: RE | Admit: 2018-02-23 | Discharge: 2018-02-23 | Disposition: A | Discharge status: Home / Self Care | Payer: Medicare PPO | Source: Ambulatory Visit | Attending: Nurse Practitioner | Admitting: Nurse Practitioner

## 2018-02-23 DIAGNOSIS — Z1231 Encounter for screening mammogram for malignant neoplasm of breast: Secondary | ICD-10-CM | POA: Diagnosis not present

## 2018-02-23 DIAGNOSIS — Z1239 Encounter for other screening for malignant neoplasm of breast: Secondary | ICD-10-CM

## 2018-03-05 DIAGNOSIS — I480 Paroxysmal atrial fibrillation: Secondary | ICD-10-CM | POA: Insufficient documentation

## 2018-04-01 ENCOUNTER — Emergency Department: Payer: Medicare PPO

## 2018-04-01 ENCOUNTER — Encounter: Payer: Self-pay | Admitting: Emergency Medicine

## 2018-04-01 DIAGNOSIS — Z96642 Presence of left artificial hip joint: Secondary | ICD-10-CM | POA: Insufficient documentation

## 2018-04-01 DIAGNOSIS — Z7901 Long term (current) use of anticoagulants: Secondary | ICD-10-CM | POA: Diagnosis not present

## 2018-04-01 DIAGNOSIS — Z79899 Other long term (current) drug therapy: Secondary | ICD-10-CM | POA: Insufficient documentation

## 2018-04-01 DIAGNOSIS — I82812 Embolism and thrombosis of superficial veins of left lower extremities: Secondary | ICD-10-CM | POA: Insufficient documentation

## 2018-04-01 DIAGNOSIS — Z85828 Personal history of other malignant neoplasm of skin: Secondary | ICD-10-CM | POA: Insufficient documentation

## 2018-04-01 DIAGNOSIS — R2242 Localized swelling, mass and lump, left lower limb: Secondary | ICD-10-CM | POA: Diagnosis present

## 2018-04-01 LAB — BASIC METABOLIC PANEL
ANION GAP: 8 (ref 5–15)
BUN: 20 mg/dL (ref 8–23)
CALCIUM: 9.3 mg/dL (ref 8.9–10.3)
CHLORIDE: 102 mmol/L (ref 98–111)
CO2: 29 mmol/L (ref 22–32)
CREATININE: 1.06 mg/dL — AB (ref 0.44–1.00)
GFR calc non Af Amer: 49 mL/min — ABNORMAL LOW (ref >60)
GFR, EST AFRICAN AMERICAN: 57 mL/min — AB (ref >60)
GLUCOSE: 161 mg/dL — AB (ref 70–99)
Potassium: 4.1 mmol/L (ref 3.5–5.1)
Sodium: 139 mmol/L (ref 135–145)

## 2018-04-01 LAB — CBC
HCT: 36.4 % (ref 35.0–47.0)
HEMOGLOBIN: 12.3 g/dL (ref 12.0–16.0)
MCH: 30.2 pg (ref 26.0–34.0)
MCHC: 33.7 g/dL (ref 32.0–36.0)
MCV: 89.5 fL (ref 80.0–100.0)
Platelets: 283 10*3/uL (ref 150–440)
RBC: 4.07 MIL/uL (ref 3.80–5.20)
RDW: 14.5 % (ref 11.5–14.5)
WBC: 9.3 10*3/uL (ref 3.6–11.0)

## 2018-04-01 NOTE — ED Triage Notes (Signed)
Patient states that she had a cardiac ablation on Monday for afib. Patient states that yesterday and today. Patient states that she also noticed an increase in swelling in her left groin today.

## 2018-04-02 ENCOUNTER — Emergency Department
Admission: EM | Admit: 2018-04-02 | Discharge: 2018-04-02 | Disposition: A | Discharge status: Home / Self Care | Payer: Medicare PPO | Attending: Emergency Medicine | Admitting: Emergency Medicine

## 2018-04-02 DIAGNOSIS — R609 Edema, unspecified: Secondary | ICD-10-CM

## 2018-04-02 DIAGNOSIS — I82890 Acute embolism and thrombosis of other specified veins: Secondary | ICD-10-CM

## 2018-04-02 HISTORY — DX: Unspecified atrial fibrillation: I48.91

## 2018-04-02 NOTE — ED Provider Notes (Signed)
[facility] Emergency Department Provider Note ____   First MD Initiated Contact with Patient 04/02/18 0128     (approximate)  I have reviewed the triage vital signs and the nursing notes.   HISTORY  Chief Complaint Post-op Problem and Dizziness    HPI Sarah Riddle is a 77 y.o. female with below list of chronic medical including recent cardiac ablation secondary to atrial fibrillation which was performed 5 days ago conditions presents to the emergency department with increased swelling and discomfort in the left groin at the site of catheterization x1 day.  Patient denies any fever.  She denies any chest pain no shortness of breath.   Past Medical History:  Diagnosis Date  . A-fib ([place])   . Cancer ([place])    Melanoma x 2 -- Lt Leg -1976 Rt Arm - 2014    There are no active problems to display for this patient.   Past Surgical History:  Procedure Laterality Date  . ABDOMINAL HYSTERECTOMY    . BACK SURGERY    . BREAST CYST ASPIRATION Left 2001   Negative  . CARDIAC ELECTROPHYSIOLOGY MAPPING AND ABLATION    . EYE SURGERY    . TOTAL HIP ARTHROPLASTY Left     Prior to Admission medications   Medication Sig Start Date End Date Taking? Authorizing Provider  allopurinol (ZYLOPRIM) 100 MG tablet Take 100 mg by mouth daily. 06/22/17   [provider]  atorvastatin (LIPITOR) 40 MG tablet Take 40 mg by mouth daily. 07/14/17   [provider]  ezetimibe (ZETIA) 10 MG tablet Take 10 mg by mouth daily. 08/01/17   [provider]  fluticasone (FLONASE) 50 MCG/ACT nasal spray Place 2 sprays into both nostrils daily. 07/31/17   [provider]  furosemide (LASIX) 20 MG tablet Take 1 tablet by mouth daily. 07/13/17   [provider]  gabapentin (NEURONTIN) 800 MG tablet Take 1 tablet by mouth 3 (three) times daily. 08/15/17   [provider]  isosorbide mononitrate (IMDUR) 30 MG 24 hr tablet Take 1 tablet  by mouth daily. 06/20/17   [provider]  levocetirizine (XYZAL) 5 MG tablet Take 5 mg by mouth daily. 07/31/17   [provider]  losartan (COZAAR) 50 MG tablet Take 50 mg by mouth daily. 08/23/17   [provider]  nortriptyline (PAMELOR) 10 MG capsule Take 10 mg by mouth 2 (two) times daily. 08/15/17   [provider]  pantoprazole (PROTONIX) 40 MG tablet Take 40 mg by mouth daily. 08/22/17   [provider]  SOTALOL AF 80 MG TABS Take 80 mg by mouth 2 (two) times daily. 08/23/17   [provider]  traZODone (DESYREL) 150 MG tablet Take 150 mg by mouth at bedtime. 08/08/17   [provider]  warfarin (COUMADIN) 2 MG tablet Take 2 mg by mouth daily. 06/24/17   [provider]    Allergies Iodinated diagnostic agents; Ibuprofen; Lubiprostone; Sulfa antibiotics; Zolpidem; Boniva [ibandronic acid]; Celecoxib; Duloxetine; and Tetracyclines & related  Family History  Problem Relation Age of Onset  . Breast cancer Neg Hx     Social History Social History   Tobacco Use  . Smoking status: Never Smoker  . Smokeless tobacco: Never Used  Substance Use Topics  . Alcohol use: No    Frequency: Never  . Drug use: No    Review of Systems Constitutional: No fever/chills Eyes: No visual changes. ENT: No sore throat. Cardiovascular: Denies chest pain. Respiratory:  Denies shortness of breath. Gastrointestinal: No abdominal pain.  No nausea, no vomiting.  No diarrhea.  No constipation. Genitourinary: Negative for dysuria. Musculoskeletal: Negative for neck pain.  Negative for back pain. Integumentary: Negative for rash. Neurological: Negative for headaches, focal weakness or numbness.  ____________________________________________   PHYSICAL EXAM:  VITAL SIGNS: ED Triage Vitals [04/01/18 2053]  Enc Vitals Group     BP (!) 151/70     Pulse Rate 93     Resp 18     Temp 98.4 F (36.9 C)     Temp Source Oral      SpO2 100 %     Weight 100.7 kg (222 lb)     Height 1.803 m (5\' 11" )     Head Circumference      Peak Flow      Pain Score 0     Pain Loc      Pain Edu?      Excl. in [place]?     Constitutional: Alert and oriented. Well appearing and in no acute distress. Eyes: Conjunctivae are normal.  Mouth/Throat: Mucous membranes are moist. Neck: No stridor.   Cardiovascular: Normal rate, regular rhythm. Good peripheral circulation. Grossly normal heart sounds. Respiratory: Normal respiratory effort.  No retractions. Lungs CTAB. Gastrointestinal: Soft and nontender. No distention.  Musculoskeletal: Approximate 2 cm well-circumscribed area of tenderness left groin at the site of catheterization puncture site noted.  No overlying skin changes. Neurologic:  Normal speech and language. No gross focal neurologic deficits are appreciated.  Skin:  Skin is warm, dry and intact. No rash noted. Psychiatric: Mood and affect are normal. Speech and behavior are normal.  ____________________________________________   LABS (all labs ordered are listed, but only abnormal results are displayed)  Labs Reviewed  BASIC METABOLIC PANEL - Abnormal; Notable for the following components:      Result Value   Glucose, Bld 161 (*)    Creatinine, Ser 1.06 (*)    GFR calc non Af Amer 49 (*)    GFR calc Af Amer 57 (*)    All other components within normal limits  CBC   ____________________________________________  EKG  ED ECG REPORT I, [place] Donal Lynam, the attending physician, personally viewed and interpreted this ECG.   Date: 04/02/2018  EKG Time: 8:59 PM  Rate: 90  Rhythm: Normal sinus rhythm  Axis: Normal  Intervals: Normal  ST&T Change: None  ____________________________________________  RADIOLOGY I, [place] Kiely, personally viewed and evaluated these images (plain radiographs) as part of my medical decision making, as well as reviewing the written report by the radiologist.  ED MD  interpretation: Thrombosed superficial varicose vein in the left groin noted on ultrasound per radiologist.  Official radiology report(s): [place] Lt Lower Extrem Ltd Soft Tissue Non Vascular  Result Date: 04/01/2018 CLINICAL DATA:  77 year old female with swelling of the left groin. Status post ablation of varicose vein on 03/27/2018 EXAM: ULTRASOUND left LOWER EXTREMITY LIMITED TECHNIQUE: Ultrasound examination of the lower extremity soft tissues was performed in the area of clinical concern. COMPARISON:  None. FINDINGS: Targeted ultrasound of the left groin in the area of clinical concern demonstrates a serpiginous tubular structure with no internal vascularity most consistent with a thrombosed superficial varicose vein in keeping with history of recent ablation. This vessel does not appear to communicate with the deeper venous system. There is no drainable fluid collection. No hyperemia. IMPRESSION: Thrombosed superficial varicose vein in the left groin. Electronically Signed   By: Anner Crete  M.D.   On: 04/01/2018 22:26      Procedures   ____________________________________________   INITIAL IMPRESSION / ASSESSMENT AND PLAN / ED COURSE  As part of my medical decision making, I reviewed the following data within the electronic MEDICAL RECORD NUMBER   77 year old female presenting with above-stated history and physical exam of left groin pain status post cardiac ablation.  Given concern for possible pseudoaneurysm ultrasound was performed which revealed evidence of a thrombosed superficial vein in the area of discomfort.  Spoke with the patient at length regarding ultrasound findings.  Patient advised to follow-up with cardiologist as planned    ____________________________________________  FINAL CLINICAL IMPRESSION(S) / ED DIAGNOSES  Final diagnoses:  Swelling  Superficial vein thrombosis     MEDICATIONS GIVEN DURING THIS VISIT:  Medications - No data to display   ED Discharge  Orders    None       Note:  This document was prepared using Dragon voice recognition software and may include unintentional dictation errors.    Gregor Hams, MD 04/02/18 [phone]

## 2018-04-02 NOTE — ED Notes (Signed)
Pt waiting patiently for treatment room with 2 visitors; denies pain at this time; updated on waite time, verbalized understanding

## 2018-09-21 ENCOUNTER — Emergency Department
Admission: EM | Admit: 2018-09-21 | Discharge: 2018-09-21 | Disposition: A | Discharge status: Home / Self Care | Payer: Medicare PPO | Attending: Emergency Medicine | Admitting: Emergency Medicine

## 2018-09-21 ENCOUNTER — Encounter: Payer: Self-pay | Admitting: Emergency Medicine

## 2018-09-21 ENCOUNTER — Other Ambulatory Visit: Payer: Self-pay

## 2018-09-21 ENCOUNTER — Emergency Department: Payer: Medicare PPO

## 2018-09-21 DIAGNOSIS — Y939 Activity, unspecified: Secondary | ICD-10-CM | POA: Diagnosis not present

## 2018-09-21 DIAGNOSIS — S0101XA Laceration without foreign body of scalp, initial encounter: Secondary | ICD-10-CM | POA: Diagnosis not present

## 2018-09-21 DIAGNOSIS — W010XXA Fall on same level from slipping, tripping and stumbling without subsequent striking against object, initial encounter: Secondary | ICD-10-CM | POA: Insufficient documentation

## 2018-09-21 DIAGNOSIS — Z79899 Other long term (current) drug therapy: Secondary | ICD-10-CM | POA: Diagnosis not present

## 2018-09-21 DIAGNOSIS — Y929 Unspecified place or not applicable: Secondary | ICD-10-CM | POA: Diagnosis not present

## 2018-09-21 DIAGNOSIS — Y92009 Unspecified place in unspecified non-institutional (private) residence as the place of occurrence of the external cause: Secondary | ICD-10-CM

## 2018-09-21 DIAGNOSIS — S8002XA Contusion of left knee, initial encounter: Secondary | ICD-10-CM

## 2018-09-21 DIAGNOSIS — Y999 Unspecified external cause status: Secondary | ICD-10-CM | POA: Insufficient documentation

## 2018-09-21 DIAGNOSIS — W19XXXA Unspecified fall, initial encounter: Secondary | ICD-10-CM

## 2018-09-21 DIAGNOSIS — S80912A Unspecified superficial injury of left knee, initial encounter: Secondary | ICD-10-CM | POA: Diagnosis present

## 2018-09-21 NOTE — ED Notes (Signed)
Pt states she fell at home states unsure of reason, states does have a hx of left leg "giving out". States fell onto a desk, hit left side head on desk no loc. Pt also co left knee pain, with swelling. Pt states has been ambulatory since. Pt denies any neck or back pain.

## 2018-09-21 NOTE — ED Notes (Signed)
Patient discharged to home per MD order. Patient in stable condition, and deemed medically cleared by ED provider for discharge. Discharge instructions reviewed with patient/family using "Teach Back"; verbalized understanding of medication education and administration, and information about follow-up care. Denies further concerns. ° °

## 2018-09-21 NOTE — ED Triage Notes (Signed)
Pt had mechanical fall today at 1650 that resulted in pt hitting her head. Pt on warfarin. No neuro deficits in triage. Pt reports minimal pain in her head but reports significant pain to her left knee. Pt denies a loc.

## 2018-09-21 NOTE — ED Provider Notes (Signed)
[facility] Emergency Department Provider Note  ____________________________________________  Time seen: Approximately 8:40 PM  I have reviewed the triage vital signs and the nursing notes.   HISTORY  Chief Complaint Fall    HPI Sarah Riddle is a 77 y.o. female who presents the emergency department complaining of a fall, hitting her head and sustaining a left knee injury.  Patient reports that she has a history of her left leg "giving out."  She is unsure whether she lost her balance or her leg gave out from underneath her.  She fell onto her left side, striking her head on a desk on the way down.  Patient denies any loss of consciousness.  She denies any headache, visual changes, neck pain, chest pain, shortness of breath, domino pain, nausea vomiting.  Patient did sustain a small laceration to the left scalp but states that the bleeding had stopped on its own without any treatment.  Patient is also complaining of left knee pain but states that she is still ambulatory on same.  She reports mild swelling to the left knee.  No other injury or complaint.  No medications prior to arrival.   Patient is on warfarin.   Past Medical History:  Diagnosis Date  . A-fib ([place])   . Cancer ([place])    Melanoma x 2 -- Lt Leg -1976 Rt Arm - 2014    There are no active problems to display for this patient.   Past Surgical History:  Procedure Laterality Date  . ABDOMINAL HYSTERECTOMY    . BACK SURGERY    . BREAST CYST ASPIRATION Left 2001   Negative  . CARDIAC ELECTROPHYSIOLOGY MAPPING AND ABLATION    . EYE SURGERY    . TOTAL HIP ARTHROPLASTY Left     Prior to Admission medications   Medication Sig Start Date End Date Taking? Authorizing Provider  allopurinol (ZYLOPRIM) 100 MG tablet Take 100 mg by mouth daily. 06/22/17   [provider]  atorvastatin (LIPITOR) 40 MG tablet Take 40 mg by mouth daily. 07/14/17   [provider]  ezetimibe (ZETIA) 10  MG tablet Take 10 mg by mouth daily. 08/01/17   [provider]  fluticasone (FLONASE) 50 MCG/ACT nasal spray Place 2 sprays into both nostrils daily. 07/31/17   [provider]  furosemide (LASIX) 20 MG tablet Take 1 tablet by mouth daily. 07/13/17   [provider]  gabapentin (NEURONTIN) 800 MG tablet Take 1 tablet by mouth 3 (three) times daily. 08/15/17   [provider]  isosorbide mononitrate (IMDUR) 30 MG 24 hr tablet Take 1 tablet by mouth daily. 06/20/17   [provider]  levocetirizine (XYZAL) 5 MG tablet Take 5 mg by mouth daily. 07/31/17   [provider]  losartan (COZAAR) 50 MG tablet Take 50 mg by mouth daily. 08/23/17   [provider]  nortriptyline (PAMELOR) 10 MG capsule Take 10 mg by mouth 2 (two) times daily. 08/15/17   [provider]  pantoprazole (PROTONIX) 40 MG tablet Take 40 mg by mouth daily. 08/22/17   [provider]  SOTALOL AF 80 MG TABS Take 80 mg by mouth 2 (two) times daily. 08/23/17   [provider]  traZODone (DESYREL) 150 MG tablet Take 150 mg by mouth at bedtime. 08/08/17   [provider]  warfarin (COUMADIN) 2 MG tablet Take 2 mg by mouth daily. 06/24/17   [provider]    Allergies Iodinated diagnostic agents; Ibuprofen; Lubiprostone; Sulfa  antibiotics; Zolpidem; Boniva [ibandronic acid]; Celecoxib; Duloxetine; and Tetracyclines & related  Family History  Problem Relation Age of Onset  . Breast cancer Neg Hx     Social History Social History   Tobacco Use  . Smoking status: Never Smoker  . Smokeless tobacco: Never Used  Substance Use Topics  . Alcohol use: No    Frequency: Never  . Drug use: No     Review of Systems  Constitutional: No fever/chills Eyes: No visual changes. No discharge ENT: No upper respiratory complaints. Cardiovascular: no chest pain. Respiratory: no cough. No SOB. Gastrointestinal: No abdominal pain.  No  nausea, no vomiting.  No diarrhea.  No constipation. Musculoskeletal: Positive for left knee pain Skin: Negative for rash, abrasions.  Positive for left-sided scalp laceration Neurological: Patient hit her head but denies headaches, focal weakness or numbness. 10-point ROS otherwise negative.  ____________________________________________   PHYSICAL EXAM:  VITAL SIGNS: ED Triage Vitals  Enc Vitals Group     BP 09/21/18 1705 (!) 150/73     Pulse Rate 09/21/18 1705 69     Resp 09/21/18 1705 18     Temp 09/21/18 1705 97.8 F (36.6 C)     Temp Source 09/21/18 1705 Oral     SpO2 09/21/18 1705 98 %     Weight 09/21/18 1707 220 lb (99.8 kg)     Height 09/21/18 1707 5\' 11"  (1.803 m)     Head Circumference --      Peak Flow --      Pain Score 09/21/18 1706 8     Pain Loc --      Pain Edu? --      Excl. in [place]? --      Constitutional: Alert and oriented. Well appearing and in no acute distress. Eyes: Conjunctivae are normal. PERRL. EOMI. Head: Superficial laceration measuring approximately 1 cm in length.  No bleeding.  No foreign body.  Area is nontender to palpation.  No visible signs of trauma.  No palpable abnormality.  No tenderness to palpation elsewhere on the skull and face.  No battle signs, raccoon eyes, serosanguineous fluid drainage from the ears or nares. ENT:      Ears:       Nose: No congestion/rhinnorhea.      Mouth/Throat: Mucous membranes are moist.  Neck: No stridor.  No cervical spine tenderness to palpation.  Cardiovascular: Normal rate, regular rhythm. Normal S1 and S2.  Good peripheral circulation. Respiratory: Normal respiratory effort without tachypnea or retractions. Lungs CTAB. Good air entry to the bases with no decreased or absent breath sounds. Musculoskeletal: Full range of motion to all extremities. No gross deformities appreciated.  Visualization of the left knee reveals mild edema when compared with right.  Mild ecchymosis over the tibial tuberosity.   Patient is tender to palpation of the tibial tuberosity but no other tenderness to palpation.  No palpable abnormality or deficits.  Varus, valgus, Lachman's, McMurray's is negative.  Dorsalis pedis pulse intact distally.  Sensation intact distally. Neurologic:  Normal speech and language. No gross focal neurologic deficits are appreciated.  Cranial nerves II through XII grossly intact.  Negative Romberg's and pronator drift. Skin:  Skin is warm, dry and intact. No rash noted. Psychiatric: Mood and affect are normal. Speech and behavior are normal. Patient exhibits appropriate insight and judgement.   ____________________________________________   LABS (all labs ordered are listed, but only abnormal results are displayed)  Labs Reviewed - No data to display ____________________________________________  EKG  ____________________________________________  RADIOLOGY I personally viewed and evaluated these images as part of my medical decision making, as well as reviewing the written report by the radiologist.  Ct Head Wo Contrast  Result Date: 09/21/2018 CLINICAL DATA:  Fall today.  Head injury EXAM: CT HEAD WITHOUT CONTRAST TECHNIQUE: Contiguous axial images were obtained from the base of the skull through the vertex without intravenous contrast. COMPARISON:  MRI head 02/08/2013 FINDINGS: Brain: No evidence of acute infarction, hemorrhage, hydrocephalus, extra-axial collection or mass lesion/mass effect. Vascular: Mild arterial calcification. Negative for hyperdense vessel Skull: Negative for skull fracture Sinuses/Orbits: Negative Other: None IMPRESSION: No acute abnormality. Electronically Signed   By: Franchot Gallo M.D.   On: 09/21/2018 17:45   Dg Knee Complete 4 Views Left  Result Date: 09/21/2018 CLINICAL DATA:  Fall today EXAM: LEFT KNEE - COMPLETE 4+ VIEW COMPARISON:  None. FINDINGS: Negative for fracture or joint effusion. Soft tissue swelling anteriorly. Degenerative change in  the medial joint compartment with subchondral cyst in the proximal tibia. IMPRESSION: No acute abnormality. Electronically Signed   By: Franchot Gallo M.D.   On: 09/21/2018 18:07    ____________________________________________    PROCEDURES  Procedure(s) performed:    Procedures    Medications - No data to display   ____________________________________________   INITIAL IMPRESSION / ASSESSMENT AND PLAN / ED COURSE  Pertinent labs & imaging results that were available during my care of the patient were reviewed by me and considered in my medical decision making (see chart for details).  Review of the [place] CSRS was performed in accordance of the [place] prior to dispensing any controlled drugs.      Patient's diagnosis is consistent with fall resulting in mild scalp laceration, left knee contusion.  Patient presents emergency department complaining of laceration to the left scalp after hitting her head during a fall.  She is also endorsing left knee pain.  Overall, exam is reassuring with patient being neurologically intact.  Negative head CT.  Negative knee x-ray.  Over-the-counter medications at home as needed for pain.  Follow-up with primary care or orthopedics as necessary..  Patient is given ED precautions to return to the ED for any worsening or new symptoms.     ____________________________________________  FINAL CLINICAL IMPRESSION(S) / ED DIAGNOSES  Final diagnoses:  Fall in home, initial encounter  Laceration of scalp, initial encounter  Contusion of left knee, initial encounter      NEW MEDICATIONS STARTED DURING THIS VISIT:  ED Discharge Orders    None          This chart was dictated using voice recognition software/Dragon. Despite best efforts to proofread, errors can occur which can change the meaning. Any change was purely unintentional.    Darletta Moll, PA-C 09/21/18 2046    Nance Pear, MD 09/21/18 2147

## 2018-10-04 HISTORY — PX: OTHER SURGICAL HISTORY: SHX169

## 2018-11-28 DIAGNOSIS — R202 Paresthesia of skin: Secondary | ICD-10-CM

## 2018-11-28 DIAGNOSIS — R200 Anesthesia of skin: Secondary | ICD-10-CM | POA: Insufficient documentation

## 2018-12-14 ENCOUNTER — Other Ambulatory Visit: Payer: Self-pay | Admitting: Nurse Practitioner

## 2018-12-14 DIAGNOSIS — Z1231 Encounter for screening mammogram for malignant neoplasm of breast: Secondary | ICD-10-CM

## 2018-12-28 ENCOUNTER — Ambulatory Visit: Payer: Medicare PPO | Admitting: Nurse Practitioner

## 2019-01-18 NOTE — Progress Notes (Deleted)
Patient's Name: Sarah Riddle  MRN: [id number]  Referring Provider: Jannifer Franklin, NP  DOB: 07-16-1941  PCP: Sarah Maltese, MD  DOS: 01/24/2019  Note by: Sarah Santa, MD  Service setting: Ambulatory outpatient  Specialty: Interventional Pain Management  Location: [facility] Virtual Visit  Visit type: Initial Patient Evaluation  Patient type: New Patient   Pain Management Virtual Encounter Note - Virtual Visit via  ***  Telehealth (real-time audio visits between healthcare provider and patient).  Patient's Phone No.:  [phone] (home); [phone] (mobile); (Preferred) [phone] [email or web address] No Pharmacies Listed  Pre-screening note:  Our staff contacted [name] and offered her an "in person", "face-to-face" appointment versus a telephone encounter. She indicated preferring the telephone encounter, at this time.  Primary Reason(s) for Visit: Tele-Encounter for initial evaluation of one or more chronic problems (new to examiner) potentially causing chronic pain, and posing a threat to normal musculoskeletal function. (Level of risk: High) CC: No chief complaint on file.  I contacted Sarah Riddle on 01/24/2019 at 9:51 AM via  ***  and clearly identified myself as Sarah Santa, MD. I verified that I was speaking with the correct person using two identifiers (Name and date of birth: 1941/09/24).  Advanced Informed Consent I sought verbal advanced consent from Sarah Riddle for virtual visit interactions. I informed [name] of possible security and privacy concerns, risks, and limitations associated with providing "not-in-person" medical evaluation and management services. I also informed [name] of the availability of "in-person" appointments. Finally, I informed her that there would be a charge for the virtual visit and that she could be  personally, fully or partially, financially responsible for it. [name] expressed understanding and agreed to  proceed.   HPI  [name] is a 78 y.o. year old, female patient, contacted today for an initial evaluation of her chronic pain. She does not have a problem list on file.  Pain Assessment: Location:     Radiating:   Onset:   Duration:   Quality:   Severity:  /10 (subjective, self-reported pain score)  Note: Reported level is compatible with observation.                         When using our objective Pain Scale, levels between 6 and 10/10 are said to belong in an emergency room, as it progressively worsens from a 6/10, described as severely limiting, requiring emergency care not usually available at an outpatient pain management facility. At a 6/10 level, communication becomes difficult and requires great effort. Assistance to reach the emergency department may be required. Facial flushing and profuse sweating along with potentially dangerous increases in heart rate and blood pressure will be evident. Effect on ADL:   Timing:   Modifying factors:   BP:    HR:    Onset and Duration: {Hx; Onset and Duration:210120511} Cause of pain: {Hx; Cause:210120521} Severity: {Pain Severity:210120502} Timing: {Symptoms; Timing:210120501} Aggravating Factors: {Causes; Aggravating pain factors:210120507} Alleviating Factors: {Causes; Alleviating Factors:210120500} Associated Problems: {Hx; Associated problems:210120515} Quality of Pain: {Hx; Symptom quality or Descriptor:210120531} Previous Examinations or Tests: {Hx; Previous examinations or test:210120529} Previous Treatments: {Hx; Previous Treatment:210120503}  ***  The patient was informed that my practice is divided into two sections: an interventional pain management section, as well as a completely separate and distinct medication management section. I explained that I have procedure days for my interventional therapies, and evaluation days for follow-ups and medication management. Because of the  amount of documentation required during both,  they are kept separated. This means that there is the possibility that she may be scheduled for a procedure on one day, and medication management the next. I have also informed her that because of staffing and facility limitations, I no longer take patients for medication management only. To illustrate the reasons for this, I gave the patient the example of surgeons, and how inappropriate it would be to refer a patient to his/her care, just to write for the post-surgical antibiotics on a surgery done by a different surgeon.   Because interventional pain management is my board-certified specialty, the patient was informed that joining my practice means that they are open to any and all interventional therapies. I made it clear that this does not mean that they will be forced to have any procedures done. What this means is that I believe interventional therapies to be essential part of the diagnosis and proper management of chronic pain conditions. Therefore, patients not interested in these interventional alternatives will be better served under the care of a different practitioner.  The patient was also made aware of my Comprehensive Pain Management Safety Guidelines where by joining my practice, they limit all of their nerve blocks and joint injections to those done by our practice, for as long as we are retained to manage their care.   Historic Controlled Substance Pharmacotherapy Review  PMP and historical list of controlled substances: ***  Highest opioid analgesic regimen found: ***  Most recent opioid analgesic: ***  Current opioid analgesics: ***  Highest recorded MME/day: *** mg/day MME/day: *** mg/day Medications: The patient did not bring the medication(s) to the appointment, as requested in our "New Patient Package" Pharmacodynamics: Desired effects: Analgesia: The patient reports >50% benefit. Reported improvement in function: The patient reports medication allows her to accomplish basic  ADLs. Clinically meaningful improvement in function (CMIF): Sustained CMIF goals met Perceived effectiveness: Described as relatively effective, allowing for increase in activities of daily living (ADL) Undesirable effects: Side-effects or Adverse reactions: None reported Historical Monitoring: The patient  reports no history of drug use. List of all UDS Test(s): No results found for: MDMA, COCAINSCRNUR, [place], [place], CANNABQUANT, [place], [place] List of other Serum/Urine Drug Screening Test(s):  No results found for: AMPHSCRSER, BARBSCRSER, BENZOSCRSER, COCAINSCRSER, COCAINSCRNUR, PCPSCRSER, PCPQUANT, THCSCRSER, THCU, CANNABQUANT, OPIATESCRSER, OXYSCRSER, PROPOXSCRSER, ETH Historical Background Evaluation: [place] PMP: PDMP not reviewed this encounter. Six (6) year initial data search conducted.             PMP NARX Score Report:  Narcotic: *** Sedative: *** Stimulant: *** [place] Department of public safety, offender search: Editor, commissioning Information) Non-contributory Risk Assessment Profile: Aberrant behavior: None observed or detected today Risk factors for fatal opioid overdose: None identified today PMP NARX Overdose Risk Score: *** Fatal overdose hazard ratio (HR): Calculation deferred Non-fatal overdose hazard ratio (HR): Calculation deferred Risk of opioid abuse or dependence: 0.7-3.0% with doses ? 36 MME/day and 6.1-26% with doses ? 120 MME/day. Substance use disorder (SUD) risk level: See below Personal History of Substance Abuse (SUD-Substance use disorder):  Alcohol:    Illegal Drugs:    Rx Drugs:    ORT Risk Level calculation:    ORT Scoring interpretation table:  Score <3 = Low Risk for SUD  Score between 4-7 = Moderate Risk for SUD  Score >8 = High Risk for Opioid Abuse   PHQ-2 Depression Scale:  Total score:    PHQ-2 Scoring interpretation table: (Score and probability of major  depressive disorder)  Score 0 = No depression  Score 1 = 15.4% Probability  Score 2 = 21.1%  Probability  Score 3 = 38.4% Probability  Score 4 = 45.5% Probability  Score 5 = 56.4% Probability  Score 6 = 78.6% Probability   PHQ-9 Depression Scale:  Total score:    PHQ-9 Scoring interpretation table:  Score 0-4 = No depression  Score 5-9 = Mild depression  Score 10-14 = Moderate depression  Score 15-19 = Moderately severe depression  Score 20-27 = Severe depression (2.4 times higher risk of SUD and 2.89 times higher risk of overuse)   Pharmacologic Plan: As per protocol, I have not taken over any controlled substance management, pending the results of ordered tests and/or consults.            Initial impression: Pending review of available data and ordered tests.  Meds   Current Outpatient Medications:  .  allopurinol (ZYLOPRIM) 100 MG tablet, Take 100 mg by mouth daily., Disp: , Rfl:  .  atorvastatin (LIPITOR) 40 MG tablet, Take 40 mg by mouth daily., Disp: , Rfl:  .  ezetimibe (ZETIA) 10 MG tablet, Take 10 mg by mouth daily., Disp: , Rfl:  .  fluticasone (FLONASE) 50 MCG/ACT nasal spray, Place 2 sprays into both nostrils daily., Disp: , Rfl:  .  furosemide (LASIX) 20 MG tablet, Take 1 tablet by mouth daily., Disp: , Rfl:  .  gabapentin (NEURONTIN) 800 MG tablet, Take 1 tablet by mouth 3 (three) times daily., Disp: , Rfl:  .  isosorbide mononitrate (IMDUR) 30 MG 24 hr tablet, Take 1 tablet by mouth daily., Disp: , Rfl:  .  levocetirizine (XYZAL) 5 MG tablet, Take 5 mg by mouth daily., Disp: , Rfl:  .  losartan (COZAAR) 50 MG tablet, Take 50 mg by mouth daily., Disp: , Rfl:  .  nortriptyline (PAMELOR) 10 MG capsule, Take 10 mg by mouth 2 (two) times daily., Disp: , Rfl:  .  pantoprazole (PROTONIX) 40 MG tablet, Take 40 mg by mouth daily., Disp: , Rfl:  .  SOTALOL AF 80 MG TABS, Take 80 mg by mouth 2 (two) times daily., Disp: , Rfl:  .  traZODone (DESYREL) 150 MG tablet, Take 150 mg by mouth at bedtime., Disp: , Rfl:  .  warfarin (COUMADIN) 2 MG tablet, Take 2 mg by mouth  daily., Disp: , Rfl:   ROS  Cardiovascular: {Hx; Cardiovascular History:210120525} Pulmonary or Respiratory: {Hx; Pumonary and/or Respiratory History:210120523} Neurological: {Hx; Neurological:210120504} Review of Past Neurological Studies:  Results for orders placed or performed during the hospital encounter of 09/21/18  CT Head Wo Contrast   Narrative   CLINICAL DATA:  Fall today.  Head injury  EXAM: CT HEAD WITHOUT CONTRAST  TECHNIQUE: Contiguous axial images were obtained from the base of the skull through the vertex without intravenous contrast.  COMPARISON:  MRI head 02/08/2013  FINDINGS: Brain: No evidence of acute infarction, hemorrhage, hydrocephalus, extra-axial collection or mass lesion/mass effect.  Vascular: Mild arterial calcification. Negative for hyperdense vessel  Skull: Negative for skull fracture  Sinuses/Orbits: Negative  Other: None  IMPRESSION: No acute abnormality.   Electronically Signed   By: Franchot Gallo M.D.   On: 09/21/2018 17:45    Psychological-Psychiatric: {Hx; Psychological-Psychiatric History:210120512} Gastrointestinal: {Hx; Gastrointestinal:210120527} Genitourinary: {Hx; Genitourinary:210120506} Hematological: {Hx; Hematological:210120510} Endocrine: {Hx; Endocrine history:210120509} Rheumatologic: {Hx; Rheumatological:210120530} Musculoskeletal: {Hx; Musculoskeletal:210120528} Work History: {Hx; Work history:210120514}  Allergies  Ms. Whitis is allergic to iodinated diagnostic agents; ibuprofen; lubiprostone; sulfa antibiotics;  zolpidem; boniva [ibandronic acid]; celecoxib; duloxetine; and tetracyclines & related.  Laboratory Chemistry  Inflammation Markers (CRP: Acute Phase) (ESR: Chronic Phase) Lab Results  Component Value Date   LATICACIDVEN 0.9 08/25/2017                         Rheumatology Markers No results found for: RF, ANA, LABURIC, URICUR, LYMEIGGIGMAB, LYMEABIGMQN, HLAB27                      Renal  Function Markers Lab Results  Component Value Date   BUN 20 04/01/2018   CREATININE 1.06 (H) 04/01/2018   GFRAA 57 (L) 04/01/2018   GFRNONAA 49 (L) 04/01/2018                             Hepatic Function Markers Lab Results  Component Value Date   AST 18 08/25/2017   ALT 13 (L) 08/25/2017   ALBUMIN 3.7 08/25/2017   ALKPHOS 76 08/25/2017                        Electrolytes Lab Results  Component Value Date   NA 139 04/01/2018   K 4.1 04/01/2018   CL 102 04/01/2018   CALCIUM 9.3 04/01/2018                        Neuropathy Markers No results found for: VITAMINB12, FOLATE, HGBA1C, HIV                      CNS Tests No results found for: COLORCSF, APPEARCSF, RBCCOUNTCSF, WBCCSF, POLYSCSF, LYMPHSCSF, EOSCSF, PROTEINCSF, GLUCCSF, JCVIRUS, CSFOLI, IGGCSF, LABACHR, ACETBL                      Bone Pathology Markers No results found for: [place], [id number], [id number], [id number], 25OHVITD1, 25OHVITD2, 25OHVITD3, TESTOFREE, TESTOSTERONE                       Coagulation Parameters Lab Results  Component Value Date   PLT 283 04/01/2018                        Cardiovascular Markers Lab Results  Component Value Date   TROPONINI 0.03 ([place]) 08/25/2017   HGB 12.3 04/01/2018   HCT 36.4 04/01/2018                         ID Markers No results found for: LYMEIGGIGMAB, HIV                      CA Markers No results found for: CEA, CA125, LABCA2                      Endocrine Markers No results found for: TSH, FREET4, TESTOFREE, TESTOSTERONE, SHBG, ESTRADIOL, ESTRADIOLPCT, ESTRADIOLFRE, LABPREG, ACTH                      Note: Lab results reviewed.  Imaging Review  Cervical Imaging: Cervical MR wo contrast: No results found for this or any previous visit. Cervical MR wo contrast: No procedure found. Cervical MR w/wo contrast: No results found for this or any previous visit. Cervical MR w contrast: No results found for this or any previous  visit. Cervical CT wo contrast:  No results found for this or any previous visit. Cervical CT w/wo contrast: No results found for this or any previous visit. Cervical CT w/wo contrast: No results found for this or any previous visit. Cervical CT w contrast: No results found for this or any previous visit. Cervical CT outside: No results found for this or any previous visit. Cervical DG 1 view: No results found for this or any previous visit. Cervical DG 2-3 views: No results found for this or any previous visit. Cervical DG F/E views: No results found for this or any previous visit. Cervical DG 2-3 clearing views: No results found for this or any previous visit. Cervical DG Bending/F/E views: No results found for this or any previous visit. Cervical DG complete: No results found for this or any previous visit. Cervical DG Myelogram views: No results found for this or any previous visit. Cervical DG Myelogram views: No results found for this or any previous visit. Cervical Discogram views: No results found for this or any previous visit.  Shoulder Imaging: Shoulder-R MR w contrast: No results found for this or any previous visit. Shoulder-L MR w contrast: No results found for this or any previous visit. Shoulder-R MR w/wo contrast: No results found for this or any previous visit. Shoulder-L MR w/wo contrast: No results found for this or any previous visit. Shoulder-R MR wo contrast: No results found for this or any previous visit. Shoulder-L MR wo contrast: No results found for this or any previous visit. Shoulder-R CT w contrast: No results found for this or any previous visit. Shoulder-L CT w contrast: No results found for this or any previous visit. Shoulder-R CT w/wo contrast: No results found for this or any previous visit. Shoulder-L CT w/wo contrast: No results found for this or any previous visit. Shoulder-R CT wo contrast: No results found for this or any previous visit. Shoulder-L CT wo contrast: No results found  for this or any previous visit. Shoulder-R DG Arthrogram: No results found for this or any previous visit. Shoulder-L DG Arthrogram: No results found for this or any previous visit. Shoulder-R DG 1 view: No results found for this or any previous visit. Shoulder-L DG 1 view: No results found for this or any previous visit. Shoulder-R DG: No results found for this or any previous visit. Shoulder-L DG: No results found for this or any previous visit.  Thoracic Imaging: Thoracic MR wo contrast: No results found for this or any previous visit. Thoracic MR wo contrast: No procedure found. Thoracic MR w/wo contrast: No results found for this or any previous visit. Thoracic MR w contrast: No results found for this or any previous visit. Thoracic CT wo contrast: No results found for this or any previous visit. Thoracic CT w/wo contrast: No results found for this or any previous visit. Thoracic CT w/wo contrast: No results found for this or any previous visit. Thoracic CT w contrast: No results found for this or any previous visit. Thoracic DG 2-3 views: No results found for this or any previous visit. Thoracic DG 4 views: No results found for this or any previous visit. Thoracic DG: No results found for this or any previous visit. Thoracic DG w/swimmers view: No results found for this or any previous visit. Thoracic DG Myelogram views: No results found for this or any previous visit. Thoracic DG Myelogram views: No results found for this or any previous visit.  Lumbosacral Imaging: Lumbar MR wo contrast: No results  found for this or any previous visit. Lumbar MR wo contrast: No procedure found. Lumbar MR w/wo contrast: No results found for this or any previous visit. Lumbar MR w/wo contrast: No results found for this or any previous visit. Lumbar MR w contrast: No results found for this or any previous visit. Lumbar CT wo contrast: No results found for this or any previous visit. Lumbar CT w/wo  contrast: No results found for this or any previous visit. Lumbar CT w/wo contrast: No results found for this or any previous visit. Lumbar CT w contrast: No results found for this or any previous visit. Lumbar DG 1V: No results found for this or any previous visit. Lumbar DG 1V (Clearing): No results found for this or any previous visit. Lumbar DG 2-3V (Clearing): No results found for this or any previous visit. Lumbar DG 2-3 views: No results found for this or any previous visit. Lumbar DG (Complete) 4+V: No results found for this or any previous visit.       Lumbar DG F/E views: No results found for this or any previous visit.       Lumbar DG Bending views: No results found for this or any previous visit.       Lumbar DG Myelogram views: No results found for this or any previous visit. Lumbar DG Myelogram: No results found for this or any previous visit. Lumbar DG Myelogram: No results found for this or any previous visit. Lumbar DG Myelogram: No results found for this or any previous visit. Lumbar DG Myelogram Lumbosacral: No results found for this or any previous visit. Lumbar DG Diskogram views: No results found for this or any previous visit. Lumbar DG Diskogram views: No results found for this or any previous visit. Lumbar DG Epidurogram OP: No results found for this or any previous visit. Lumbar DG Epidurogram IP: No results found for this or any previous visit.  Sacroiliac Joint Imaging: Sacroiliac Joint DG: No results found for this or any previous visit. Sacroiliac Joint MR w/wo contrast: No results found for this or any previous visit. Sacroiliac Joint MR wo contrast: No results found for this or any previous visit.  Spine Imaging: Whole Spine DG Myelogram views: No results found for this or any previous visit. Whole Spine MR Mets screen: No results found for this or any previous visit. Whole Spine MR Mets screen: No results found for this or any previous visit. Whole Spine  MR w/wo: No results found for this or any previous visit. MRA Spinal Canal w/ cm: No results found for this or any previous visit. MRA Spinal Canal wo/ cm: No procedure found. MRA Spinal Canal w/wo cm: No results found for this or any previous visit. Spine Outside MR Films: No results found for this or any previous visit. Spine Outside CT Films: No results found for this or any previous visit. CT-Guided Biopsy: No results found for this or any previous visit. CT-Guided Needle Placement: No results found for this or any previous visit. DG Spine outside: No results found for this or any previous visit. IR Spine outside: No results found for this or any previous visit. NM Spine outside: No results found for this or any previous visit.  Hip Imaging: Hip-R MR w contrast: No results found for this or any previous visit. Hip-L MR w contrast: No results found for this or any previous visit. Hip-R MR w/wo contrast: No results found for this or any previous visit. Hip-L MR w/wo contrast: No results  found for this or any previous visit. Hip-R MR wo contrast: No results found for this or any previous visit. Hip-L MR wo contrast: No results found for this or any previous visit. Hip-R CT w contrast: No results found for this or any previous visit. Hip-L CT w contrast: No results found for this or any previous visit. Hip-R CT w/wo contrast: No results found for this or any previous visit. Hip-L CT w/wo contrast: No results found for this or any previous visit. Hip-R CT wo contrast: No results found for this or any previous visit. Hip-L CT wo contrast: No results found for this or any previous visit. Hip-R DG 2-3 views: No results found for this or any previous visit. Hip-L DG 2-3 views: No results found for this or any previous visit. Hip-R DG Arthrogram: No results found for this or any previous visit. Hip-L DG Arthrogram: No results found for this or any previous visit. Hip-B DG Bilateral: No results  found for this or any previous visit.  Knee Imaging: Knee-R MR w contrast: No results found for this or any previous visit. Knee-L MR w contrast: No results found for this or any previous visit. Knee-R MR w/wo contrast: No results found for this or any previous visit. Knee-L MR w/wo contrast: No results found for this or any previous visit. Knee-R MR wo contrast: No results found for this or any previous visit. Knee-L MR wo contrast: No results found for this or any previous visit. Knee-R CT w contrast: No results found for this or any previous visit. Knee-L CT w contrast: No results found for this or any previous visit. Knee-R CT w/wo contrast: No results found for this or any previous visit. Knee-L CT w/wo contrast: No results found for this or any previous visit. Knee-R CT wo contrast: No results found for this or any previous visit. Knee-L CT wo contrast: No results found for this or any previous visit. Knee-R DG 1-2 views: No results found for this or any previous visit. Knee-L DG 1-2 views: No results found for this or any previous visit. Knee-R DG 3 views: No results found for this or any previous visit. Knee-L DG 3 views: No results found for this or any previous visit. Knee-R DG 4 views: No results found for this or any previous visit. Knee-L DG 4 views:  Results for orders placed during the hospital encounter of 09/21/18  DG Knee Complete 4 Views Left   Narrative CLINICAL DATA:  Fall today  EXAM: LEFT KNEE - COMPLETE 4+ VIEW  COMPARISON:  None.  FINDINGS: Negative for fracture or joint effusion.  Soft tissue swelling anteriorly.  Degenerative change in the medial joint compartment with subchondral cyst in the proximal tibia.  IMPRESSION: No acute abnormality.   Electronically Signed   By: Franchot Gallo M.D.   On: 09/21/2018 18:07    Knee-R DG Arthrogram: No results found for this or any previous visit. Knee-L DG Arthrogram: No results found for this or any  previous visit.  Ankle Imaging: Ankle-R DG Complete: No results found for this or any previous visit. Ankle-L DG Complete: No results found for this or any previous visit.  Foot Imaging: Foot-R DG Complete: No results found for this or any previous visit. Foot-L DG Complete: No results found for this or any previous visit.  Elbow Imaging: Elbow-R DG Complete: No results found for this or any previous visit. Elbow-L DG Complete: No results found for this or any previous visit.  Wrist  Imaging: Wrist-R DG Complete: No results found for this or any previous visit. Wrist-L DG Complete: No results found for this or any previous visit.  Hand Imaging: Hand-R DG Complete: No results found for this or any previous visit. Hand-L DG Complete: No results found for this or any previous visit.  Complexity Note: Imaging results reviewed. Results shared with Ms. Stann Mainland, using Layman's terms.                         [place]  Drug: Ms. Mandarino  reports no history of drug use. Alcohol:  reports no history of alcohol use. Tobacco:  reports that she has never smoked. She has never used smokeless tobacco. Medical:  has a past medical history of A-fib ([place]) and Cancer ([place]). Family: family history is not on file.  Past Surgical History:  Procedure Laterality Date  . ABDOMINAL HYSTERECTOMY    . BACK SURGERY    . BREAST CYST ASPIRATION Left 2001   Negative  . CARDIAC ELECTROPHYSIOLOGY MAPPING AND ABLATION    . EYE SURGERY    . TOTAL HIP ARTHROPLASTY Left    Active Ambulatory Problems    Diagnosis Date Noted  . No Active Ambulatory Problems   Resolved Ambulatory Problems    Diagnosis Date Noted  . No Resolved Ambulatory Problems   Past Medical History:  Diagnosis Date  . A-fib ([place])   . Cancer [facility])    Assessment  Primary Diagnosis & Pertinent Problem List: There were no encounter diagnoses.  Visit Diagnosis (New problems to examiner): No diagnosis found. Plan of Care (Initial workup  plan)  Note: Ms. Eifert was reminded that as per protocol, today's visit has been an evaluation only. We have not taken over the patient's controlled substance management.  Problem-specific plan: No problem-specific Assessment & Plan notes found for this encounter.  Lab Orders  No laboratory test(s) ordered today   Imaging Orders  No imaging studies ordered today   Referral Orders  No referral(s) requested today   Procedure Orders    No procedure(s) ordered today   Pharmacotherapy (current): Medications ordered:  No orders of the defined types were placed in this encounter.  Medications administered during this visit: Carole Doner. [place] had no medications administered during this visit.   Pharmacological management options:  Opioid Analgesics: The patient was informed that there is no guarantee that she would be a candidate for opioid analgesics. The decision will be made following CDC guidelines. This decision will be based on the results of diagnostic studies, as well as Ms. Grygiel's risk profile.   Membrane stabilizer: To be determined at a later time  Muscle relaxant: To be determined at a later time  NSAID: To be determined at a later time  Other analgesic(s): To be determined at a later time   Interventional management options: Ms. Muhlbauer was informed that there is no guarantee that she would be a candidate for interventional therapies. The decision will be based on the results of diagnostic studies, as well as Ms. Ames's risk profile.  Procedure(s) under consideration:  ***   Provider-requested follow-up: No follow-ups on file.  Future Appointments  Date Time Provider [place]  01/24/2019  9:30 AM Sarah Santa, MD [facility] None  02/27/2019  9:20 AM [facility]    Primary Care Physician: Sarah Maltese, MD Location: [facility] Note by: Sarah Santa, MD Date: 01/24/2019; Time: 9:51 AM

## 2019-01-23 ENCOUNTER — Telehealth: Payer: Self-pay | Admitting: *Deleted

## 2019-01-23 ENCOUNTER — Encounter: Payer: Self-pay | Admitting: Pain Medicine

## 2019-01-23 NOTE — Progress Notes (Signed)
Patient's Name: Sarah Riddle  MRN: [id number]  Referring Provider: Jannifer Franklin, NP  DOB: 05-31-1941  PCP: Perrin Maltese, MD  DOS: 01/24/2019  Note by: Gaspar Cola, MD  Service setting: Ambulatory outpatient  Specialty: Interventional Pain Management  Location: [facility] Pain Management Virtual Visit  Visit type: Initial Patient Evaluation  Patient type: New Patient   Pain Management Virtual Encounter Note - Virtual Visit via Telephone Telehealth (real-time audio visits between healthcare provider and patient).  Patient's Phone No.:  [phone] (home); [phone] (mobile); (Preferred) [phone] [email or web address]  [facility] - [place], [place] [address] Phone: [phone] Fax: [phone]   Pre-screening note:  Our staff contacted Ms. Santee and offered her an "in person", "face-to-face" appointment versus a telephone encounter. She indicated preferring the telephone encounter, at this time.  Primary Reason(s) for Visit: Tele-Encounter for initial evaluation of one or more chronic problems (new to examiner) potentially causing chronic pain, and posing a threat to normal musculoskeletal function. (Level of risk: High) CC: Foot Pain (bilateral)  I contacted CYMONE YESKE on 01/24/2019 at 8:39 AM via telephone and clearly identified myself as Gaspar Cola, MD. I verified that I was speaking with the correct person using two identifiers (Name and date of birth: [birth year]).  Advanced Informed Consent I sought verbal advanced consent from [name] for virtual visit interactions. I informed Ms. Diop of possible security and privacy concerns, risks, and limitations associated with providing "not-in-person" medical evaluation and management services. I also informed Ms. Boulanger of the availability of "in-person" appointments. Finally, I informed her that there would be a charge for the virtual visit and  that she could be  personally, fully or partially, financially responsible for it. Ms. Fini expressed understanding and agreed to proceed.   HPI  Ms. Graff is a 78 y.o. year old, female patient, contacted today for an initial evaluation of her chronic pain. She has Age-related osteoporosis without current pathological fracture; Allergic rhinitis; Benign essential hypertension; Chronic kidney disease; Chronic tension-type headache, not intractable; Difficulty walking; Fibrocystic breast disease; GERD (gastroesophageal reflux disease); Gout; Chronic pain of lower extremity (Bilateral); Cervicogenic headache (Tertiary Area of Pain) (Right); Hyperlipidemia; Irritable bowel syndrome; Chronic low back pain (Secondary Area of Pain) (Bilateral) (R>L) without sciatica; Melanoma of upper arm ([place]); Migraine headache; Mild obesity; Neuropathy; Nontraumatic complete tear of rotator cuff (Left); Numbness and tingling; Numbness and tingling of feet (Bilateral); Obstructive sleep apnea (adult) (pediatric); Osteoarthrosis, unspecified whether generalized or localized, pelvic region and thigh; Osteoporosis; PAF (paroxysmal atrial fibrillation) ([place]); Pain in both lower extremities; Osteoarthritis of knee (Left); Osteoarthritis of shoulder (Left); Rotator cuff tendinitis, left; Chronic pain syndrome; Chronic feet pain (Primary area of Pain) (Bilateral) (L>R); Chronic neck pain (Fourth Area of Pain) (Right); Cervicalgia (Right); Chronic shoulder pain (Fifth Area of Pain) (Left); Chronic knee pain (Sixth Area of Pain) (Left); Chronic anticoagulation (Coumadin); Disorder of skeletal system; Pharmacologic therapy; Problems influencing health status; DDD (degenerative disc disease), cervical; DDD (degenerative disc disease), lumbar; and Failed back surgical syndrome on their problem list.  Pain Assessment: Location: Right, Left Foot Radiating: denies Onset: More than a month ago Duration: Chronic pain Quality: Burning,  Numbness, Tingling Severity: 6 /10 (subjective, self-reported pain score)  Effect on ADL: difficulty performing daily activities Timing: Constant Modifying factors: lying down  Onset and Duration: Gradual and Present longer than 3 months Cause of pain: Unknown Severity: Getting worse, NAS-11 at its worse: 10/10,  NAS-11 at its best: 6/10, NAS-11 now: 10/10 and NAS-11 on the average: 10/10 Timing: Afternoon, Night and After a period of immobility Aggravating Factors: no aggravating factors, "hurts the same all the time" Alleviating Factors: Lying down and Medications Associated Problems: Numbness and Tingling Quality of Pain: Hot and Tingling Previous Examinations or Tests: Nerve conduction test Previous Treatments: Narcotic medications  According to the patient the primary area pain is that of her feet, bilaterally, with the left being worse than the right.  The patient indicates having had a nerve conduction test by Dr. Melrose Nakayama, at the [facility], which apparently indicates that the patient has a chronic sensory peripheral neuropathy.  She describes decreased sensations below the knee, bilaterally.  She denies any surgeries, trauma, recent physical therapy or x-rays of the area.  She has seen a podiatrist (Dr. Caryl Comes) at the [facility].  She indicates that he has done some injections in the past thinking that she had a plantar fasciitis, but they did not help.  The patient secondary area of pain is that of the lower back, bilaterally, with the right being worse than the left.  The patient describes having had 2 prior back surgeries, the first 1 around 1998 by Dr. calyx, and the second 1 in 2000, also by Dr. Adalberto Ill.  She also indicates having had multiple interventional injection therapies by me, more than 2 years ago.  She denies any recent imaging or recent physical therapy.  Years ago she had some physical therapy for her balance problems and she also received some physical therapy after  the back surgeries.  The patient's third area of pain is her headaches which apparently have been under control.  These are being treated by Dr. Melrose Nakayama at the [facility] neurology department.  He is treating them primarily with amitriptyline.  She describes the headaches as being on the right side of the head over the temple and ear area.  The area described by the patient seems to be that of the right lesser occipital nerve, suggesting the possibility of this being cervicogenic.  In fact, the patient does admit to having also neck pain on that same side.  The patient's fourth area of pain is her neck, primarily on the right side.  She denies any prior surgeries or injection therapy.  She denies any recent x-rays or physical therapy.  The next area of pain is described to be that of the left shoulder.  She describes that this has been treated in the past by Dr. Earnestine Leys from [facility], who did some injections of the shoulder that seem to have helped and suggested that she may need a shoulder replacement.  However, she got a second opinion by Dr. Shawnie Dapper.,  [facility] orthopedics, who also did some injections which also did not help her with the pain, but he indicated that she was still far from being a candidate for a shoulder replacement.  The patient denies any surgery on the shoulder or any recent x-rays.  She also denies any recent physical therapy for the shoulder.  The next area pain is described to be the left knee.  The patient indicates having had injections done by Dr. Earnestine Leys from [facility], which did help.  She indicates that this injections were done many years ago.  She denies any surgery or any recent x-rays.  She also denies any physical therapy for that knee.  In reviewing her chart, it does appear that she did have an  x-ray done around December 2019, which the patient indicates not remembering having this done.  The patient was informed that my practice is divided  into two sections: an interventional pain management section, as well as a completely separate and distinct medication management section. I explained that I have procedure days for my interventional therapies, and evaluation days for follow-ups and medication management. Because of the amount of documentation required during both, they are kept separated. This means that there is the possibility that she may be scheduled for a procedure on one day, and medication management the next. I have also informed her that because of staffing and facility limitations, I no longer take patients for medication management only. To illustrate the reasons for this, I gave the patient the example of surgeons, and how inappropriate it would be to refer a patient to his/her care, just to write for the post-surgical antibiotics on a surgery done by a different surgeon.   Because interventional pain management is my board-certified specialty, the patient was informed that joining my practice means that they are open to any and all interventional therapies. I made it clear that this does not mean that they will be forced to have any procedures done. What this means is that I believe interventional therapies to be essential part of the diagnosis and proper management of chronic pain conditions. Therefore, patients not interested in these interventional alternatives will be better served under the care of a different practitioner.  The patient was also made aware of my Comprehensive Pain Management Safety Guidelines where by joining my practice, they limit all of their nerve blocks and joint injections to those done by our practice, for as long as we are retained to manage their care.   Historic Controlled Substance Pharmacotherapy Review  PMP and historical list of controlled substances: Tramadol Hcl 50 MG Tablet, 1 tablet p.o. daily (#7) (filled: 12/04/2018) (by: Jannifer Franklin, NP) MME/day: 4 mg/day Medications: The patient  did not bring the medication(s) to the appointment, as requested in our "New Patient Package" Pharmacodynamics: Desired effects: Analgesia: The patient reports >50% benefit. Reported improvement in function: The patient reports medication allows her to accomplish basic ADLs. Clinically meaningful improvement in function (CMIF): Sustained CMIF goals met Perceived effectiveness: Described as relatively effective, allowing for increase in activities of daily living (ADL) Undesirable effects: Side-effects or Adverse reactions: None reported Historical Monitoring: The patient  reports no history of drug use. List of all UDS Test(s): No results found. List of other Serum/Urine Drug Screening Test(s):  No results found. Historical Background Evaluation: [place] PMP: PDMP reviewed during this encounter. Six (6) year initial data search conducted.             PMP NARX Score Report:  Narcotic: 080 Sedative: 050 Stimulant: 000 [place] Department of public safety, offender search: Editor, commissioning Information) Non-contributory Risk Assessment Profile: Aberrant behavior: None observed or detected today Risk factors for fatal opioid overdose: None identified today PMP NARX Overdose Risk Score: 200 Fatal overdose hazard ratio (HR): Calculation deferred Non-fatal overdose hazard ratio (HR): Calculation deferred Risk of opioid abuse or dependence: 0.7-3.0% with doses ? 36 MME/day and 6.1-26% with doses ? 120 MME/day. Substance use disorder (SUD) risk level: See below Personal History of Substance Abuse (SUD-Substance use disorder):  Alcohol: Negative  Illegal Drugs: Negative  Rx Drugs: Negative  ORT Risk Level calculation: Low Risk Opioid Risk Tool - 01/23/19 1057      Family History of Substance Abuse   Alcohol  Negative    Illegal Drugs  Negative    Rx Drugs  Negative      Personal History of Substance Abuse   Alcohol  Negative    Illegal Drugs  Negative    Rx Drugs  Negative      Age   Age between  50-45 years   No      History of Preadolescent Sexual Abuse   History of Preadolescent Sexual Abuse  Negative or Female      Psychological Disease   Psychological Disease  Negative    Depression  Negative      Total Score   Opioid Risk Tool Scoring  0    Opioid Risk Interpretation  Low Risk      ORT Scoring interpretation table:  Score <3 = Low Risk for SUD  Score between 4-7 = Moderate Risk for SUD  Score >8 = High Risk for Opioid Abuse   Pharmacologic Plan: As per protocol, I have not taken over any controlled substance management, pending the results of ordered tests and/or consults.            Initial impression: Pending review of available data and ordered tests.  Meds   Current Outpatient Medications:  .  acetaminophen (TYLENOL) 500 MG tablet, Take 500 mg by mouth 2 (two) times a day., Disp: , Rfl:  .  alendronate (FOSAMAX) 70 MG tablet, Take 70 mg by mouth once a week. Take with a full glass of water on an empty stomach., Disp: , Rfl:  .  allopurinol (ZYLOPRIM) 100 MG tablet, Take 100 mg by mouth daily., Disp: , Rfl:  .  amitriptyline (ELAVIL) 25 MG tablet, Take 25 mg by mouth at bedtime., Disp: , Rfl:  .  atorvastatin (LIPITOR) 40 MG tablet, Take 40 mg by mouth daily., Disp: , Rfl:  .  Cranberry 125 MG TABS, Take by mouth daily., Disp: , Rfl:  .  Cyanocobalamin (VITAMIN B 12 PO), Take 1,000 mg by mouth daily. Monday - Friday, Disp: , Rfl:  .  EPINEPHrine (EPIPEN IJ), Inject as directed., Disp: , Rfl:  .  ergocalciferol (VITAMIN D2) 1.25 MG (50000 UT) capsule, Take 50,000 Units by mouth once a week., Disp: , Rfl:  .  ezetimibe (ZETIA) 10 MG tablet, Take 10 mg by mouth daily., Disp: , Rfl:  .  fluticasone (FLONASE) 50 MCG/ACT nasal spray, Place 2 sprays into both nostrils daily., Disp: , Rfl:  .  furosemide (LASIX) 20 MG tablet, Take 1 tablet by mouth daily., Disp: , Rfl:  .  gabapentin (NEURONTIN) 800 MG tablet, Take 1 tablet by mouth 4 (four) times daily. , Disp: , Rfl:  .   isosorbide mononitrate (IMDUR) 30 MG 24 hr tablet, Take 1 tablet by mouth daily., Disp: , Rfl:  .  levocetirizine (XYZAL) 5 MG tablet, Take 5 mg by mouth daily., Disp: , Rfl:  .  losartan (COZAAR) 50 MG tablet, Take 50 mg by mouth daily., Disp: , Rfl:  .  metoprolol succinate (TOPROL-XL) 25 MG 24 hr tablet, Take 25 mg by mouth daily., Disp: , Rfl:  .  pantoprazole (PROTONIX) 40 MG tablet, Take 40 mg by mouth daily., Disp: , Rfl:  .  sertraline (ZOLOFT) 25 MG tablet, Take 25 mg by mouth daily., Disp: , Rfl:  .  warfarin (COUMADIN) 2 MG tablet, Take 2 mg by mouth daily., Disp: , Rfl:   ROS  Cardiovascular: Abnormal heart rhythm, Heart murmur, Heart catheterization and Blood thinners:  Anticoagulant Pulmonary  or Respiratory: No reported pulmonary signs or symptoms such as wheezing and difficulty taking a deep full breath (Asthma), difficulty blowing air out (Emphysema), coughing up mucus (Bronchitis), persistent dry cough, or temporary stoppage of breathing during sleep Neurological: Abnormal skin sensations (Peripheral Neuropathy) Review of Past Neurological Studies:  Results for orders placed or performed during the hospital encounter of 09/21/18  CT Head Wo Contrast   Narrative   CLINICAL DATA:  Fall today.  Head injury  EXAM: CT HEAD WITHOUT CONTRAST  TECHNIQUE: Contiguous axial images were obtained from the base of the skull through the vertex without intravenous contrast.  COMPARISON:  MRI head 02/08/2013  FINDINGS: Brain: No evidence of acute infarction, hemorrhage, hydrocephalus, extra-axial collection or mass lesion/mass effect.  Vascular: Mild arterial calcification. Negative for hyperdense vessel  Skull: Negative for skull fracture  Sinuses/Orbits: Negative  Other: None  IMPRESSION: No acute abnormality.   Electronically Signed   By: Franchot Gallo M.D.   On: 09/21/2018 17:45    Psychological-Psychiatric: Anxiousness Gastrointestinal: Irregular, infrequent  bowel movements (Constipation) Genitourinary: No reported renal or genitourinary signs or symptoms such as difficulty voiding or producing urine, peeing blood, non-functioning kidney, kidney stones, difficulty emptying the bladder, difficulty controlling the flow of urine, or chronic kidney disease Hematological: Brusing easily Endocrine: No reported endocrine signs or symptoms such as high or low blood sugar, rapid heart rate due to high thyroid levels, obesity or weight gain due to slow thyroid or thyroid disease Rheumatologic: No reported rheumatological signs and symptoms such as fatigue, joint pain, tenderness, swelling, redness, heat, stiffness, decreased range of motion, with or without associated rash Musculoskeletal: Negative for myasthenia gravis, muscular dystrophy, multiple sclerosis or malignant hyperthermia Work History: Disabled  Allergies  Ms. Joffe is allergic to iodinated diagnostic agents; ibuprofen; lubiprostone; sulfa antibiotics; zolpidem; boniva [ibandronic acid]; celecoxib; duloxetine; and tetracyclines & related.  Laboratory Chemistry  Inflammation Markers (CRP: Acute Phase) (ESR: Chronic Phase) Lab Results  Component Value Date   LATICACIDVEN 0.9 08/25/2017                         Rheumatology Markers No results found.  Renal Function Markers Lab Results  Component Value Date   BUN 20 04/01/2018   CREATININE 1.06 (H) 04/01/2018   GFRAA 57 (L) 04/01/2018   GFRNONAA 49 (L) 04/01/2018                             Hepatic Function Markers Lab Results  Component Value Date   AST 18 08/25/2017   ALT 13 (L) 08/25/2017   ALBUMIN 3.7 08/25/2017   ALKPHOS 76 08/25/2017                        Electrolytes Lab Results  Component Value Date   NA 139 04/01/2018   K 4.1 04/01/2018   CL 102 04/01/2018   CALCIUM 9.3 04/01/2018                        Neuropathy Markers No results found.  CNS Tests No results found.  Bone Pathology Markers No results  found.  Coagulation Parameters Lab Results  Component Value Date   PLT 283 04/01/2018                        Cardiovascular Markers Lab Results  Component Value Date  TROPONINI 0.03 (HH) 08/25/2017   HGB 12.3 04/01/2018   HCT 36.4 04/01/2018                         ID Markers No results found.  CA Markers No results found.  Endocrine Markers No results found.  Note: Lab results reviewed.  Imaging Review  Knee Imaging: Knee-L DG 4 views:  Results for orders placed during the hospital encounter of 09/21/18  DG Knee Complete 4 Views Left   Narrative CLINICAL DATA:  Fall today  EXAM: LEFT KNEE - COMPLETE 4+ VIEW  COMPARISON:  None.  FINDINGS: Negative for fracture or joint effusion.  Soft tissue swelling anteriorly.  Degenerative change in the medial joint compartment with subchondral cyst in the proximal tibia.  IMPRESSION: No acute abnormality.   Electronically Signed   By: Franchot Gallo M.D.   On: 09/21/2018 18:07    Complexity Note: Imaging results reviewed. Results shared with Ms. Stann Mainland, using Layman's terms.                         [place]  Drug: Ms. Saad  reports no history of drug use. Alcohol:  reports no history of alcohol use. Tobacco:  reports that she has never smoked. She has never used smokeless tobacco. Medical:  has a past medical history of A-fib ([place]) and Cancer ([place]). Family: family history is not on file.  Past Surgical History:  Procedure Laterality Date  . ABDOMINAL HYSTERECTOMY    . BACK SURGERY    . BREAST CYST ASPIRATION Left 2001   Negative  . CARDIAC ELECTROPHYSIOLOGY MAPPING AND ABLATION    . EYE SURGERY    . TOTAL HIP ARTHROPLASTY Left    Active Ambulatory Problems    Diagnosis Date Noted  . Age-related osteoporosis without current pathological fracture 01/15/2016  . Allergic rhinitis 01/15/2016  . Benign essential hypertension 01/15/2016  . Chronic kidney disease 01/24/2019  . Chronic tension-type headache,  not intractable 07/18/2014  . Difficulty walking 07/18/2014  . Fibrocystic breast disease 01/24/2019  . GERD (gastroesophageal reflux disease) 01/24/2019  . Gout 01/24/2019  . Chronic pain of lower extremity (Bilateral) 03/13/2014  . Cervicogenic headache [facility] Area of Pain) (Right) 03/13/2014  . Hyperlipidemia 01/24/2019  . Irritable bowel syndrome 01/24/2019  . Chronic low back pain (Secondary Area of Pain) (Bilateral) (R>L) without sciatica 01/08/2014  . Melanoma of upper arm ([place]) 02/21/2013  . Migraine headache 01/24/2019  . Mild obesity 07/18/2014  . Neuropathy 07/18/2014  . Nontraumatic complete tear of rotator cuff (Left) 01/13/2018  . Numbness and tingling 03/13/2014  . Numbness and tingling of feet (Bilateral) 11/28/2018  . Obstructive sleep apnea (adult) (pediatric) 01/15/2016  . Osteoarthrosis, unspecified whether generalized or localized, pelvic region and thigh 01/08/2014  . Osteoporosis 01/24/2019  . PAF (paroxysmal atrial fibrillation) ([place]) 03/05/2018  . Pain in both lower extremities 10/29/2015  . Osteoarthritis of knee (Left) 02/21/2017  . Osteoarthritis of shoulder (Left) 01/13/2018  . Rotator cuff tendinitis, left 01/13/2018  . Chronic pain syndrome 01/24/2019  . Chronic feet pain (Primary area of Pain) (Bilateral) (L>R) 01/24/2019  . Chronic neck pain (Fourth Area of Pain) (Right) 01/24/2019  . Cervicalgia (Right) 01/24/2019  . Chronic shoulder pain (Fifth Area of Pain) (Left) 01/24/2019  . Chronic knee pain (Sixth Area of Pain) (Left) 01/24/2019  . Chronic anticoagulation (Coumadin) 01/24/2019  . Disorder of skeletal system 01/24/2019  . Pharmacologic therapy 01/24/2019  .  Problems influencing health status 01/24/2019  . DDD (degenerative disc disease), cervical 01/24/2019  . DDD (degenerative disc disease), lumbar 01/24/2019  . 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   Resolved Ambulatory Problems    Diagnosis Date Noted  . No Resolved Ambulatory  Problems   Past Medical History:  Diagnosis Date  . A-fib ([place])   . Cancer [facility])    Assessment  Primary Diagnosis & Pertinent Problem List: The primary encounter diagnosis was Chronic feet pain (Primary area of Pain) (Bilateral) (L>R). Diagnoses of Numbness and tingling of feet (Bilateral), Chronic pain of lower extremity (Bilateral), Chronic low back pain (Secondary Area of Pain) (Bilateral) (R>L) without sciatica, Failed back surgical syndrome, DDD (degenerative disc disease), lumbar, Cervicogenic headache (Tertiary Area of Pain) (Right), Chronic neck pain (Fourth Area of Pain) (Right), DDD (degenerative disc disease), cervical, Cervicalgia (Right), Chronic shoulder pain (Fifth Area of Pain) (Left), Nontraumatic complete tear of rotator cuff (Left), Osteoarthritis of shoulder (Left), Chronic knee pain (Sixth Area of Pain) (Left), Osteoarthritis of knee (Left), Chronic gout without tophus, unspecified cause, unspecified site, Chronic pain syndrome, Pharmacologic therapy, Chronic anticoagulation (Coumadin), Disorder of skeletal system, and Problems influencing health status were also pertinent to this visit.  Visit Diagnosis (New problems to examiner): 1. Chronic feet pain (Primary area of Pain) (Bilateral) (L>R)   2. Numbness and tingling of feet (Bilateral)   3. Chronic pain of lower extremity (Bilateral)   4. Chronic low back pain (Secondary Area of Pain) (Bilateral) (R>L) without sciatica   5. Failed back surgical syndrome   6. DDD (degenerative disc disease), lumbar   7. Cervicogenic headache (Tertiary Area of Pain) (Right)   8. Chronic neck pain (Fourth Area of Pain) (Right)   9. DDD (degenerative disc disease), cervical   10. Cervicalgia (Right)   11. Chronic shoulder pain (Fifth Area of Pain) (Left)   12. Nontraumatic complete tear of rotator cuff (Left)   13. Osteoarthritis of shoulder (Left)   14. Chronic knee pain (Sixth Area of Pain) (Left)   15. Osteoarthritis of knee (Left)    16. Chronic gout without tophus, unspecified cause, unspecified site   17. Chronic pain syndrome   18. Pharmacologic therapy   19. Chronic anticoagulation (Coumadin)   20. Disorder of skeletal system   21. Problems influencing health status    Plan of Care (Initial workup plan)  Note: Ms. Grussing was reminded that as per protocol, today's visit has been an evaluation only. We have not taken over the patient's controlled substance management.  Problem-specific plan: No problem-specific Assessment & Plan notes found for this encounter.   Lab Orders     Compliance Drug Analysis, Ur     Comp. Metabolic Panel (12)     Magnesium     Vitamin B12     Sedimentation rate     25-Hydroxyvitamin D Lcms D2+D3     C-reactive protein     Uric acid     Uric acid, random urine  Imaging Orders     DG Cervical Spine With Flex & Extend     DG Foot Complete Left     DG Foot Complete Right     DG Lumbar Spine Complete W/Bend     DG Shoulder Left     DG Knee 1-2 Views Left Referral Orders  No referral(s) requested today   Procedure Orders    No procedure(s) ordered today   Pharmacotherapy (current): Medications ordered:  No orders of the defined types were placed in this encounter.  Medications administered during this visit: Sintia Mckissic. Bahr had no medications administered during this visit.   Pharmacological management options:  Opioid Analgesics: The patient was informed that there is no guarantee that she would be a candidate for opioid analgesics. The decision will be made following CDC guidelines. This decision will be based on the results of diagnostic studies, as well as Ms. Coop's risk profile.   Membrane stabilizer: To be determined at a later time  Muscle relaxant: To be determined at a later time  NSAID: Medically contraindicated due to chronic oral anticoagulation  Other analgesic(s): To be determined at a later time   Interventional management options: Ms. Meigs was  informed that there is no guarantee that she would be a candidate for interventional therapies. The decision will be based on the results of diagnostic studies, as well as Ms. Mcafee's risk profile.  Procedure(s) under consideration:  Today we will be ordering some lab work and x-rays and depending on those results, then we will narrow down what we could offer this patient.  Based on prior history, she may be a candidate for: Diagnostic bilateral injections in her feet  Diagnostic bilateral lumbar facet blocks  Diagnostic right-sided caudal epidural steroid injection plus diagnostic epidurogram  Diagnostic right-sided lesser occipital nerve blocks  Diagnostic right-sided cervical facet blocks  Diagnostic left shoulder injection  Diagnostic left suprascapular nerve block  Diagnostic left intra-articular knee injection  Diagnostic left genicular nerve blocks    Provider-requested follow-up: Return in about 3 weeks (around 02/14/2019) for 2nd Visit (Post-tests), w/ Dr. Dossie Arbour, (Virtual).  Future Appointments  Date Time Provider [place]  03/16/2019  9:20 AM [facility]    Total duration of non-face-to-face encounter: 45 minutes.  Primary Care Physician: Perrin Maltese, MD Location: [facility] Note by: Gaspar Cola, MD Date: 01/24/2019; Time: 9:37 AM

## 2019-01-23 NOTE — Telephone Encounter (Signed)
Discussed this with Mrs. Stann Mainland.

## 2019-01-24 ENCOUNTER — Other Ambulatory Visit: Payer: Self-pay

## 2019-01-24 ENCOUNTER — Telehealth: Payer: Self-pay

## 2019-01-24 ENCOUNTER — Ambulatory Visit
Payer: Medicare PPO | Attending: Student in an Organized Health Care Education/Training Program | Admitting: Pain Medicine

## 2019-01-24 ENCOUNTER — Ambulatory Visit: Payer: Medicare PPO | Admitting: Student in an Organized Health Care Education/Training Program

## 2019-01-24 DIAGNOSIS — G4486 Cervicogenic headache: Secondary | ICD-10-CM

## 2019-01-24 DIAGNOSIS — M79604 Pain in right leg: Secondary | ICD-10-CM | POA: Diagnosis not present

## 2019-01-24 DIAGNOSIS — K589 Irritable bowel syndrome without diarrhea: Secondary | ICD-10-CM | POA: Insufficient documentation

## 2019-01-24 DIAGNOSIS — M899 Disorder of bone, unspecified: Secondary | ICD-10-CM

## 2019-01-24 DIAGNOSIS — E785 Hyperlipidemia, unspecified: Secondary | ICD-10-CM | POA: Insufficient documentation

## 2019-01-24 DIAGNOSIS — M79605 Pain in left leg: Secondary | ICD-10-CM

## 2019-01-24 DIAGNOSIS — M25562 Pain in left knee: Secondary | ICD-10-CM

## 2019-01-24 DIAGNOSIS — M1712 Unilateral primary osteoarthritis, left knee: Secondary | ICD-10-CM

## 2019-01-24 DIAGNOSIS — R200 Anesthesia of skin: Secondary | ICD-10-CM | POA: Diagnosis not present

## 2019-01-24 DIAGNOSIS — R51 Headache: Secondary | ICD-10-CM

## 2019-01-24 DIAGNOSIS — M75122 Complete rotator cuff tear or rupture of left shoulder, not specified as traumatic: Secondary | ICD-10-CM

## 2019-01-24 DIAGNOSIS — M5030 Other cervical disc degeneration, unspecified cervical region: Secondary | ICD-10-CM | POA: Insufficient documentation

## 2019-01-24 DIAGNOSIS — M79672 Pain in left foot: Secondary | ICD-10-CM

## 2019-01-24 DIAGNOSIS — M51369 Other intervertebral disc degeneration, lumbar region without mention of lumbar back pain or lower extremity pain: Secondary | ICD-10-CM | POA: Insufficient documentation

## 2019-01-24 DIAGNOSIS — R202 Paresthesia of skin: Secondary | ICD-10-CM

## 2019-01-24 DIAGNOSIS — M79671 Pain in right foot: Secondary | ICD-10-CM

## 2019-01-24 DIAGNOSIS — M1A9XX0 Chronic gout, unspecified, without tophus (tophi): Secondary | ICD-10-CM

## 2019-01-24 DIAGNOSIS — M25512 Pain in left shoulder: Secondary | ICD-10-CM

## 2019-01-24 DIAGNOSIS — M545 Low back pain: Secondary | ICD-10-CM | POA: Diagnosis not present

## 2019-01-24 DIAGNOSIS — K219 Gastro-esophageal reflux disease without esophagitis: Secondary | ICD-10-CM | POA: Insufficient documentation

## 2019-01-24 DIAGNOSIS — M810 Age-related osteoporosis without current pathological fracture: Secondary | ICD-10-CM | POA: Insufficient documentation

## 2019-01-24 DIAGNOSIS — N189 Chronic kidney disease, unspecified: Secondary | ICD-10-CM | POA: Insufficient documentation

## 2019-01-24 DIAGNOSIS — G8929 Other chronic pain: Secondary | ICD-10-CM

## 2019-01-24 DIAGNOSIS — N6019 Diffuse cystic mastopathy of unspecified breast: Secondary | ICD-10-CM | POA: Insufficient documentation

## 2019-01-24 DIAGNOSIS — M961 Postlaminectomy syndrome, not elsewhere classified: Secondary | ICD-10-CM

## 2019-01-24 DIAGNOSIS — Z789 Other specified health status: Secondary | ICD-10-CM

## 2019-01-24 DIAGNOSIS — Z79899 Other long term (current) drug therapy: Secondary | ICD-10-CM

## 2019-01-24 DIAGNOSIS — M542 Cervicalgia: Secondary | ICD-10-CM | POA: Insufficient documentation

## 2019-01-24 DIAGNOSIS — Z7901 Long term (current) use of anticoagulants: Secondary | ICD-10-CM | POA: Insufficient documentation

## 2019-01-24 DIAGNOSIS — M109 Gout, unspecified: Secondary | ICD-10-CM | POA: Insufficient documentation

## 2019-01-24 DIAGNOSIS — G43909 Migraine, unspecified, not intractable, without status migrainosus: Secondary | ICD-10-CM | POA: Insufficient documentation

## 2019-01-24 DIAGNOSIS — G894 Chronic pain syndrome: Secondary | ICD-10-CM | POA: Insufficient documentation

## 2019-01-24 DIAGNOSIS — M5136 Other intervertebral disc degeneration, lumbar region: Secondary | ICD-10-CM

## 2019-01-24 DIAGNOSIS — M19012 Primary osteoarthritis, left shoulder: Secondary | ICD-10-CM

## 2019-01-24 NOTE — Patient Instructions (Signed)
____________________________________________________________________________________________  Medication Rules  Purpose: To inform patients, and their family members, of our rules and regulations.  Applies to: All patients receiving prescriptions (written or electronic).  Pharmacy of record: Pharmacy where electronic prescriptions will be sent. If written prescriptions are taken to a different pharmacy, please inform the nursing staff. The pharmacy listed in the electronic medical record should be the one where you would like electronic prescriptions to be sent.  Electronic prescriptions: In compliance with the [place] Strengthen Opioid Misuse Prevention (STOP) Act of 2017 (Session Law 2017-74/H243), effective October 04, 2018, all controlled substances must be electronically prescribed. Calling prescriptions to the pharmacy will cease to exist.  Prescription refills: Only during scheduled appointments. Applies to all prescriptions.  NOTE: The following applies primarily to controlled substances (Opioid* Pain Medications).   Patient's responsibilities: 1. Pain Pills: Bring all pain pills to every appointment (except for procedure appointments). 2. Pill Bottles: Bring pills in original pharmacy bottle. Always bring the newest bottle. Bring bottle, even if empty. 3. Medication refills: You are responsible for knowing and keeping track of what medications you take and those you need refilled. The day before your appointment: write a list of all prescriptions that need to be refilled. The day of the appointment: give the list to the admitting nurse. Prescriptions will be written only during appointments. No prescriptions will be written on procedure days. If you forget a medication: it will not be "Called in", "Faxed", or "electronically sent". You will need to get another appointment to get these prescribed. No early refills. Do not call asking to have your prescription filled  early. 4. Prescription Accuracy: You are responsible for carefully inspecting your prescriptions before leaving our office. Have the discharge nurse carefully go over each prescription with you, before taking them home. Make sure that your name is accurately spelled, that your address is correct. Check the name and dose of your medication to make sure it is accurate. Check the number of pills, and the written instructions to make sure they are clear and accurate. Make sure that you are given enough medication to last until your next medication refill appointment. 5. Taking Medication: Take medication as prescribed. When it comes to controlled substances, taking less pills or less frequently than prescribed is permitted and encouraged. Never take more pills than instructed. Never take medication more frequently than prescribed.  6. Inform other Doctors: Always inform, all of your healthcare providers, of all the medications you take. 7. Pain Medication from other Providers: You are not allowed to accept any additional pain medication from any other Doctor or Healthcare provider. There are two exceptions to this rule. (see below) In the event that you require additional pain medication, you are responsible for notifying us, as stated below. 8. Medication Agreement: You are responsible for carefully reading and following our Medication Agreement. This must be signed before receiving any prescriptions from our practice. Safely store a copy of your signed Agreement. Violations to the Agreement will result in no further prescriptions. (Additional copies of our Medication Agreement are available upon request.) 9. Laws, Rules, & Regulations: All patients are expected to follow all Federal and State Laws, Statutes, Rules, & Regulations. Ignorance of the Laws does not constitute a valid excuse. The use of any illegal substances is prohibited. 10. Adopted CDC guidelines & recommendations: Target dosing levels will be  at or below 60 MME/day. Use of benzodiazepines** is not recommended.  Exceptions: There are only two exceptions to the rule of not   receiving pain medications from other Healthcare Providers. 1. Exception #1 (Emergencies): In the event of an emergency (i.e.: accident requiring emergency care), you are allowed to receive additional pain medication. However, you are responsible for: As soon as you are able, call our office [phone], at any time of the day or night, and leave a message stating your name, the date and nature of the emergency, and the name and dose of the medication prescribed. In the event that your call is answered by a member of our staff, make sure to document and save the date, time, and the name of the person that took your information.  2. Exception #2 (Planned Surgery): In the event that you are scheduled by another doctor or dentist to have any type of surgery or procedure, you are allowed (for a period no longer than 30 days), to receive additional pain medication, for the acute post-op pain. However, in this case, you are responsible for picking up a copy of our "Post-op Pain Management for Surgeons" handout, and giving it to your surgeon or dentist. This document is available at our office, and does not require an appointment to obtain it. Simply go to our office during business hours (Monday-Thursday from 8:00 AM to 4:00 PM) (Friday 8:00 AM to 12:00 Noon) or if you have a scheduled appointment with us, prior to your surgery, and ask for it by name. In addition, you will need to provide us with your name, name of your surgeon, type of surgery, and date of procedure or surgery.  *Opioid medications include: morphine, codeine, oxycodone, oxymorphone, hydrocodone, hydromorphone, meperidine, tramadol, tapentadol, buprenorphine, fentanyl, methadone. **Benzodiazepine medications include: diazepam (Valium), alprazolam (Xanax), clonazepam (Klonopine), lorazepam (Ativan), clorazepate  (Tranxene), chlordiazepoxide (Librium), estazolam (Prosom), oxazepam (Serax), temazepam (Restoril), triazolam (Halcion) (Last updated: 12/01/2017) ____________________________________________________________________________________________   ____________________________________________________________________________________________  Medication Recommendations and Reminders  Applies to: All patients receiving prescriptions (written and/or electronic).  Medication Rules & Regulations: These rules and regulations exist for your safety and that of others. They are not flexible and neither are we. Dismissing or ignoring them will be considered "non-compliance" with medication therapy, resulting in complete and irreversible termination of such therapy. (See document titled "Medication Rules" for more details.) In all conscience, because of safety reasons, we cannot continue providing a therapy where the patient does not follow instructions.  Pharmacy of record:   Definition: This is the pharmacy where your electronic prescriptions will be sent.   We do not endorse any particular pharmacy.  You are not restricted in your choice of pharmacy.  The pharmacy listed in the electronic medical record should be the one where you want electronic prescriptions to be sent.  If you choose to change pharmacy, simply notify our nursing staff of your choice of new pharmacy.  Recommendations:  Keep all of your pain medications in a safe place, under lock and key, even if you live alone.   After you fill your prescription, take 1 week's worth of pills and put them away in a safe place. You should keep a separate, properly labeled bottle for this purpose. The remainder should be kept in the original bottle. Use this as your primary supply, until it runs out. Once it's gone, then you know that you have 1 week's worth of medicine, and it is time to come in for a prescription refill. If you do this correctly, it  is unlikely that you will ever run out of medicine.  To make sure that the above recommendation works,   it is very important that you make sure your medication refill appointments are scheduled at least 1 week before you run out of medicine. To do this in an effective manner, make sure that you do not leave the office without scheduling your next medication management appointment. Always ask the nursing staff to show you in your prescription , when your medication will be running out. Then arrange for the receptionist to get you a return appointment, at least 7 days before you run out of medicine. Do not wait until you have 1 or 2 pills left, to come in. This is very poor planning and does not take into consideration that we may need to cancel appointments due to bad weather, sickness, or emergencies affecting our staff.  "Partial Fill": If for any reason your pharmacy does not have enough pills/tablets to completely fill or refill your prescription, do not allow for a "partial fill". You will need a separate prescription to fill the remaining amount, which we will not provide. If the reason for the partial fill is your insurance, you will need to talk to the pharmacist about payment alternatives for the remaining tablets, but again, do not accept a partial fill.  Prescription refills and/or changes in medication(s):   Prescription refills, and/or changes in dose or medication, will be conducted only during scheduled medication management appointments. (Applies to both, written and electronic prescriptions.)  No refills on procedure days. No medication will be changed or started on procedure days. No changes, adjustments, and/or refills will be conducted on a procedure day. Doing so will interfere with the diagnostic portion of the procedure.  No phone refills. No medications will be "called into the pharmacy".  No Fax refills.  No weekend refills.  No Holliday refills.  No after hours  refills.  Remember:  Business hours are:  Monday to Thursday 8:00 AM to 4:00 PM Provider's Schedule: Crystal King, NP - Appointments are:  Medication management: Monday to Thursday 8:00 AM to 4:00 PM Maxine Fredman, MD - Appointments are:  Medication management: Monday and Wednesday 8:00 AM to 4:00 PM Procedure day: Tuesday and Thursday 7:30 AM to 4:00 PM Bilal Lateef, MD - Appointments are:  Medication management: Tuesday and Thursday 8:00 AM to 4:00 PM Procedure day: Monday and Wednesday 7:30 AM to 4:00 PM (Last update: 12/01/2017) ____________________________________________________________________________________________   ____________________________________________________________________________________________  CANNABIDIOL (AKA: CBD Oil or Pills)  Applies to: All patients receiving prescriptions of controlled substances (written and/or electronic).  General Information: Cannabidiol (CBD) was discovered in 1940. It is one of some 113 identified cannabinoids in cannabis (Marijuana) plants, accounting for up to 40% of the plant's extract. As of 2018, preliminary clinical research on cannabidiol included studies of anxiety, cognition, movement disorders, and pain.  Cannabidiol is consummed in multiple ways, including inhalation of cannabis smoke or vapor, as an aerosol spray into the cheek, and by mouth. It may be supplied as CBD oil containing CBD as the active ingredient (no added tetrahydrocannabinol (THC) or terpenes), a full-plant CBD-dominant hemp extract oil, capsules, dried cannabis, or as a liquid solution. CBD is thought not have the same psychoactivity as THC, and may affect the actions of THC. Studies suggest that CBD may interact with different biological targets, including cannabinoid receptors and other neurotransmitter receptors. As of 2018 the mechanism of action for its biological effects has not been determined.  In the [place], cannabidiol has a limited  approval by the Food and Drug Administration (FDA) for treatment of only two types   of epilepsy disorders. The side effects of long-term use of the drug include somnolence, decreased appetite, diarrhea, fatigue, malaise, weakness, sleeping problems, and others.  CBD remains a Schedule I drug prohibited for any use.  Legality: Some manufacturers ship CBD products nationally, an illegal action which the FDA has not enforced in 2018, with CBD remaining the subject of an FDA investigational new drug evaluation, and is not considered legal as a dietary supplement or food ingredient as of December 2018. Federal illegality has made it difficult historically to conduct research on CBD. CBD is openly sold in head shops and health food stores in some states where such sales have not been explicitly legalized.  Warning: Because it is not FDA approved for general use or treatment of pain, it is not required to undergo the same manufacturing controls as prescription drugs.  This means that the available cannabidiol (CBD) may be contaminated with THC.  If this is the case, it will trigger a positive urine drug screen (UDS) test for cannabinoids (Marijuana).  Because a positive UDS for illicit substances is a violation of our medication agreement, your opioid analgesics (pain medicine) may be permanently discontinued. (Last update: 12/22/2017) ____________________________________________________________________________________________   ____________________________________________________________________________________________  Blood Thinners  Recommended Time Interval Before and After Neuraxial Block or Catheter Removal  Drug (Generic) Brand Name Time Before Time After Comments  Abciximab Reopro 15 days 2 hours   Alteplase Activase 10 days 10 days   Apixaban Eliquis 3 days 6 hours   Aspirin > 325 mg Goody Powders/Excedrin 11 days  (Usually not stopped)  Aspirin ? 81 mg  7 days  (Usually not stopped)   Cholesterol Medication Lipitor 4 days    Cilostazol Pletal 3 days 5 hours   Clopidogrel Plavix 7-10 days 2 hours   Dabigatran Pradaxa 5 days 6 hours   Delteparin Fragmin 24 hours 4 hours   Dipyridamole + ASA Aggrenox 11days 2 hours   Enoxaparin  Lovenox 24 hours 4 hours   Eptifibatide Integrillin 8 hours 2 hours   Fish oil  4 days    Fondaparinux  Arixtra 72 hours 12 hours   Garlic supplements  7 days    Ginkgo biloba  36 hours    Ginseng  24 hours    Heparin (IV)  4 hours 2 hours   Heparin ([place])  12 hours 2 hours   Hydroxychloroquine Plaquenil 11 days    LMW Heparin  24 hours    LMWH  24 hours    NSAIDs  3 days  (Usually not stopped)  Prasugrel Effient 7-10 days 6 hours   Reteplase Retavase 10 days 10 days   Rivaroxaban Xarelto 3 days 6 hours   Streptokinase Streptase 10 days 10 days   Tenecteplase TNKase 10 days 10 days   Thrombolytics  10 days  10 days Avoid x 10 days after inj.  Ticagrelor Brilinta 5-7 days 6 hours   Ticlodipine Ticlid 10-14 days 2 hours   Tinzaparin Innohep 24 hours 4 hours   Tirofiban Aggrastat 8 hours 2 hours   Vitamin E  4 days    Warfarin Coumadin 5 days 2 hours   ____________________________________________________________________________________________

## 2019-01-24 NOTE — Telephone Encounter (Signed)
Mailed AVS from 01/24/19 visit

## 2019-01-25 ENCOUNTER — Ambulatory Visit
Admission: RE | Admit: 2019-01-25 | Discharge: 2019-01-25 | Disposition: A | Discharge status: Home / Self Care | Payer: Medicare PPO | Source: Ambulatory Visit | Attending: Pain Medicine | Admitting: Pain Medicine

## 2019-01-25 ENCOUNTER — Other Ambulatory Visit: Payer: Self-pay

## 2019-01-25 ENCOUNTER — Other Ambulatory Visit
Admission: RE | Admit: 2019-01-25 | Discharge: 2019-01-25 | Disposition: A | Discharge status: Home / Self Care | Payer: Medicare PPO | Source: Ambulatory Visit | Attending: Pain Medicine | Admitting: Pain Medicine

## 2019-01-25 DIAGNOSIS — M25562 Pain in left knee: Secondary | ICD-10-CM | POA: Insufficient documentation

## 2019-01-25 DIAGNOSIS — M25512 Pain in left shoulder: Secondary | ICD-10-CM | POA: Diagnosis present

## 2019-01-25 DIAGNOSIS — G4486 Cervicogenic headache: Secondary | ICD-10-CM

## 2019-01-25 DIAGNOSIS — M545 Low back pain, unspecified: Secondary | ICD-10-CM

## 2019-01-25 DIAGNOSIS — M79671 Pain in right foot: Secondary | ICD-10-CM | POA: Diagnosis present

## 2019-01-25 DIAGNOSIS — M542 Cervicalgia: Secondary | ICD-10-CM | POA: Diagnosis present

## 2019-01-25 DIAGNOSIS — M19012 Primary osteoarthritis, left shoulder: Secondary | ICD-10-CM

## 2019-01-25 DIAGNOSIS — R51 Headache: Secondary | ICD-10-CM | POA: Diagnosis present

## 2019-01-25 DIAGNOSIS — M79672 Pain in left foot: Secondary | ICD-10-CM

## 2019-01-25 DIAGNOSIS — G8929 Other chronic pain: Secondary | ICD-10-CM | POA: Insufficient documentation

## 2019-01-25 DIAGNOSIS — M1712 Unilateral primary osteoarthritis, left knee: Secondary | ICD-10-CM | POA: Insufficient documentation

## 2019-01-25 LAB — VITAMIN B12: Vitamin B-12: 432 pg/mL (ref 180–914)

## 2019-01-25 LAB — COMPREHENSIVE METABOLIC PANEL
ALT: 13 U/L (ref 0–44)
AST: 18 U/L (ref 15–41)
Albumin: 3.9 g/dL (ref 3.5–5.0)
Alkaline Phosphatase: 67 U/L (ref 38–126)
Anion gap: 11 (ref 5–15)
BUN: 21 mg/dL (ref 8–23)
CO2: 27 mmol/L (ref 22–32)
Calcium: 9.6 mg/dL (ref 8.9–10.3)
Chloride: 104 mmol/L (ref 98–111)
Creatinine, Ser: 0.83 mg/dL (ref 0.44–1.00)
GFR calc Af Amer: >60 mL/min (ref >60)
GFR calc non Af Amer: >60 mL/min (ref >60)
Glucose, Bld: 107 mg/dL — ABNORMAL HIGH (ref 70–99)
Potassium: 3.8 mmol/L (ref 3.5–5.1)
Sodium: 142 mmol/L (ref 135–145)
Total Bilirubin: 0.4 mg/dL (ref 0.3–1.2)
Total Protein: 6.6 g/dL (ref 6.5–8.1)

## 2019-01-25 LAB — C-REACTIVE PROTEIN: CRP: <0.8 mg/dL (ref <1.0)

## 2019-01-25 LAB — URINE DRUG SCREEN, QUALITATIVE (ARMC ONLY)
Amphetamines, Ur Screen: NOT DETECTED
Barbiturates, Ur Screen: NOT DETECTED
Benzodiazepine, Ur Scrn: NOT DETECTED
Cannabinoid 50 Ng, Ur ~~LOC~~: NOT DETECTED
Cocaine Metabolite,Ur ~~LOC~~: NOT DETECTED
MDMA (Ecstasy)Ur Screen: NOT DETECTED
Methadone Scn, Ur: NOT DETECTED
Opiate, Ur Screen: NOT DETECTED
Phencyclidine (PCP) Ur S: NOT DETECTED
Tricyclic, Ur Screen: POSITIVE — AB

## 2019-01-25 LAB — URIC ACID: Uric Acid, Serum: 5.7 mg/dL (ref 2.5–7.1)

## 2019-01-25 LAB — MAGNESIUM: Magnesium: 1.8 mg/dL (ref 1.7–2.4)

## 2019-01-25 LAB — SEDIMENTATION RATE: Sed Rate: 9 mm/hr (ref 0–30)

## 2019-01-26 LAB — URIC ACID, RANDOM URINE: Uric Acid, Urine: 42.1 mg/dL

## 2019-01-28 LAB — 25-HYDROXY VITAMIN D LCMS D2+D3
25-Hydroxy, Vitamin D-2: 65 ng/mL
25-Hydroxy, Vitamin D-3: 2.9 ng/mL
25-Hydroxy, Vitamin D: 68 ng/mL

## 2019-02-14 ENCOUNTER — Ambulatory Visit: Payer: Medicare PPO | Admitting: Pain Medicine

## 2019-02-14 ENCOUNTER — Encounter: Payer: Self-pay | Admitting: Pain Medicine

## 2019-02-14 NOTE — Progress Notes (Signed)
Patient's Name: Sarah Riddle  MRN: [id number]  Referring Provider: Perrin Maltese, MD  DOB: 09/19/1941  PCP: Perrin Maltese, MD  DOS: 02/15/2019  Note by: Gaspar Cola, MD  Service setting: Virtual Visit (Telephone)  Attending: Gaspar Cola, MD  Location: Telephone Encounter  Specialty: Interventional Pain Management  Patient type: Established   Pain Management Virtual Encounter Note - Virtual Visit via Telephone Telehealth (real-time audio visits between healthcare provider and patient).  Patient's Phone No.:  [phone] (home); [phone] (mobile); (Preferred) [phone] [email or web address]  [facility] - [place], [place] [place] [address] Phone: [phone] Fax: [phone]   Pre-screening note:  Our staff contacted Sarah Riddle and offered her an "in person", "face-to-face" appointment versus a telephone encounter. She indicated preferring the telephone encounter, at this time.   Primary Reason(s) for Virtual Visit: Encounter for evaluation before starting new chronic pain management plan of care (Level of risk: moderate) COVID-19*  Social distancing based on CDC ans AMA recommendations.    I contacted Sarah Riddle on 02/15/2019 at 12:14 PM via telephone.      I clearly identified myself as Gaspar Cola, MD. I verified that I was speaking with the correct person using two identifiers (Name and date of birth: [birth year]).  Advanced Informed Consent I sought verbal advanced consent from Sarah Riddle for virtual visit interactions. I informed Sarah Riddle of possible security and privacy concerns, risks, and limitations associated with providing "not-in-person" medical evaluation and management services. I also informed Sarah Riddle of the availability of "in-person" appointments. Finally, I informed her that there would be a charge for the virtual visit and that she could be  personally, fully or partially,  financially responsible for it. Sarah Riddle expressed understanding and agreed to proceed.   Historic Elements   Sarah Riddle is a 78 y.o. year old, female patient evaluated today after her last encounter by our practice on 01/24/2019. Sarah Riddle  has a past medical history of A-fib ([place]) and Cancer ([place]). She also  has a past surgical history that includes Breast cyst aspiration (Left, 2001); Cardiac electrophysiology mapping and ablation; Back surgery; Abdominal hysterectomy; Total hip arthroplasty (Left); and Eye surgery. Sarah Riddle has a current medication list which includes the following prescription(s): acetaminophen, alendronate, allopurinol, amitriptyline, atorvastatin, cranberry, cyanocobalamin, epinephrine, ergocalciferol, ezetimibe, fluticasone, furosemide, isosorbide mononitrate, levocetirizine, losartan, metoprolol succinate, OVER THE COUNTER MEDICATION, pantoprazole, sertraline, warfarin, and pregabalin. She  reports that she has never smoked. She has never used smokeless tobacco. She reports that she does not drink alcohol or use drugs. Sarah Riddle is allergic to iodinated diagnostic agents; ibuprofen; lubiprostone; sulfa antibiotics; zolpidem; boniva [ibandronic acid]; celecoxib; duloxetine; and tetracyclines & related.   HPI  She is being evaluated for review of studies ordered on initial visit and to consider treatment plan options. Today I went over the results of her tests. These were explained in "Layman's terms". During today's appointment I went over my diagnostic impression, as well as the proposed treatment plan.  Today we went over the results of her x-rays, lab work, and we also went over the results of her nerve conduction test which indicates that the patient has a bilateral lower extremity chronic severe sensory polyneuropathy.  Her vitamin B12 levels are within normal limits on the low side and therefore I have recommended that she continue taking some vitamin B12.  She  indicates having her A1c at a level  of approximately 6.  She indicates that nobody has diagnosed her with diabetes.  Today I went to chart review but I could not find any hemoglobin A1c and I also went to "care everywhere" and again I was unable to find any evidence of having been tested.  She indicates that her biggest concern is her peripheral neuropathy and this burning sensation that she gets in the legs.  She is currently on gabapentin 800 mg 4 times daily (3200 mg/day), and she still having issues with a burning sensation.  Because of the problem associated with the dose effect curve of the gabapentin where it dissociates early, we will go ahead and do a trial of Lyrica to see if this works better for her.  I will be starting her on Lyrica 75 mg p.o. 3 times daily and increase it to a maximum of 450 mg/day, as tolerated.  I have instructed the patient to discontinue the gabapentin as soon as she starts with the Lyrica and not before.  She was also instructed to give me a call if she has any problems with that.  In considering the treatment plan options, Sarah Riddle was reminded that I no longer take patients for medication management only. I asked her to let me know if she had no intention of taking advantage of the interventional therapies, so that we could make arrangements to provide this space to someone interested. I also made it clear that undergoing interventional therapies for the purpose of getting pain medications is very inappropriate on the part of a patient, and it will not be tolerated in this practice. This type of behavior would suggest true addiction and therefore it requires referral to an addiction specialist.   I discussed the assessment and treatment plan with the patient. The patient was provided an opportunity to ask questions and all were answered. The patient agreed with the plan and demonstrated an understanding of the instructions.  Patient advised to call back or seek an  in-person evaluation if the symptoms or condition worsens.  Controlled Substance Pharmacotherapy Assessment REMS (Risk Evaluation and Mitigation Strategy)  Analgesic: Gabapentin 800 mg 1 tablet p.o. every 6 hours (3200 mg/day)  Monitoring: [place] PMP: PDMP reviewed during this encounter.       Not applicable at this point since we have not taken over the patient's medication management yet. List of other Serum/Urine Drug Screening Test(s):  Lab Results  Component Value Date   COCAINSCRNUR NONE DETECTED 01/25/2019   THCU NONE DETECTED 01/25/2019   List of all UDS test(s) done:  No results found for: TOXASSSELUR, SUMMARY Last UDS on record: No results found for: TOXASSSELUR, SUMMARY UDS interpretation: No unexpected findings.          Medication Assessment Form: Patient introduced to form today Treatment compliance: Treatment may start today if patient agrees with proposed plan. Evaluation of compliance is not applicable at this point Risk Assessment Profile: Aberrant behavior: See initial evaluations. None observed or detected today Comorbid factors increasing risk of overdose: See initial evaluation. No additional risks detected today Opioid risk tool (ORT):  Opioid Risk  01/23/2019  Alcohol 0  Illegal Drugs 0  Rx Drugs 0  Alcohol 0  Illegal Drugs 0  Rx Drugs 0  Age between 16-45 years  0  History of Preadolescent Sexual Abuse 0  Psychological Disease 0  Depression 0  Opioid Risk Tool Scoring 0  Opioid Risk Interpretation Low Risk    ORT Scoring interpretation table:  Score <3 =  Low Risk for SUD  Score between 4-7 = Moderate Risk for SUD  Score >8 = High Risk for Opioid Abuse   Risk of substance use disorder (SUD): Low  Risk Mitigation Strategies:  Patient opioid safety counseling: Completed today. Counseling provided to patient as per "Patient Counseling Document". Document signed by patient, attesting to counseling and understanding Patient-Prescriber Agreement (PPA):  Obtained today.  Controlled substance notification to other providers: Written and sent today.  Pharmacologic Plan: Today we may be taking over the patient's pharmacological regimen. See below.             Meds   Current Outpatient Medications:  .  acetaminophen (TYLENOL) 500 MG tablet, Take 500 mg by mouth 2 (two) times a day., Disp: , Rfl:  .  alendronate (FOSAMAX) 70 MG tablet, Take 70 mg by mouth once a week. Take with a full glass of water on an empty stomach., Disp: , Rfl:  .  allopurinol (ZYLOPRIM) 100 MG tablet, Take 100 mg by mouth daily., Disp: , Rfl:  .  amitriptyline (ELAVIL) 25 MG tablet, Take 25 mg by mouth at bedtime., Disp: , Rfl:  .  atorvastatin (LIPITOR) 40 MG tablet, Take 40 mg by mouth daily., Disp: , Rfl:  .  Cranberry 125 MG TABS, Take by mouth daily., Disp: , Rfl:  .  Cyanocobalamin (VITAMIN B 12 PO), Take 1,000 mg by mouth daily. Monday - Friday, Disp: , Rfl:  .  EPINEPHrine (EPIPEN IJ), Inject as directed., Disp: , Rfl:  .  ergocalciferol (VITAMIN D2) 1.25 MG (50000 UT) capsule, Take 50,000 Units by mouth once a week., Disp: , Rfl:  .  ezetimibe (ZETIA) 10 MG tablet, Take 10 mg by mouth daily., Disp: , Rfl:  .  fluticasone (FLONASE) 50 MCG/ACT nasal spray, Place 2 sprays into both nostrils daily., Disp: , Rfl:  .  furosemide (LASIX) 20 MG tablet, Take 1 tablet by mouth daily., Disp: , Rfl:  .  isosorbide mononitrate (IMDUR) 30 MG 24 hr tablet, Take 1 tablet by mouth daily., Disp: , Rfl:  .  levocetirizine (XYZAL) 5 MG tablet, Take 5 mg by mouth daily., Disp: , Rfl:  .  losartan (COZAAR) 50 MG tablet, Take 50 mg by mouth daily., Disp: , Rfl:  .  metoprolol succinate (TOPROL-XL) 25 MG 24 hr tablet, Take 25 mg by mouth daily., Disp: , Rfl:  .  OVER THE COUNTER MEDICATION, Weekly allergies injection but pt uncertain what medication is given and dose., Disp: , Rfl:  .  pantoprazole (PROTONIX) 40 MG tablet, Take 40 mg by mouth daily., Disp: , Rfl:  .  sertraline (ZOLOFT)  25 MG tablet, Take 25 mg by mouth daily., Disp: , Rfl:  .  warfarin (COUMADIN) 2 MG tablet, Take 2 mg by mouth daily. 76m on Sunday and Thursday, 2 mg the other days, Disp: , Rfl:  .  pregabalin (LYRICA) 75 MG capsule, Take 1 capsule (75 mg total) by mouth 3 (three) times daily for 30 days., Disp: 90 capsule, Rfl: 0  Laboratory Chemistry  Inflammation Markers (CRP: Acute Phase) (ESR: Chronic Phase) Lab Results  Component Value Date   CRP <0.8 01/25/2019   ESRSEDRATE 9 01/25/2019   LATICACIDVEN 0.9 08/25/2017                         Rheumatology Markers Lab Results  Component Value Date   LABURIC 5.7 01/25/2019  Renal Function Markers Lab Results  Component Value Date   BUN 21 01/25/2019   CREATININE 0.83 01/25/2019   GFRAA >60 01/25/2019   GFRNONAA >60 01/25/2019                             Hepatic Function Markers Lab Results  Component Value Date   AST 18 01/25/2019   ALT 13 01/25/2019   ALBUMIN 3.9 01/25/2019   ALKPHOS 67 01/25/2019                        Electrolytes Lab Results  Component Value Date   NA 142 01/25/2019   K 3.8 01/25/2019   CL 104 01/25/2019   CALCIUM 9.6 01/25/2019   MG 1.8 01/25/2019                        Neuropathy Markers Lab Results  Component Value Date   WERXVQMG86 761 01/25/2019                        CNS Tests No results found.  Bone Pathology Markers Lab Results  Component Value Date   25OHVITD1 68 01/25/2019   25OHVITD2 65 01/25/2019   25OHVITD3 2.9 01/25/2019                         Coagulation Parameters Lab Results  Component Value Date   PLT 283 04/01/2018                        Cardiovascular Markers Lab Results  Component Value Date   TROPONINI 0.03 (HH) 08/25/2017   HGB 12.3 04/01/2018   HCT 36.4 04/01/2018                         ID Markers No results found.  CA Markers No results found.  Endocrine Markers No results found.  Note: Lab results reviewed.  Recent  Diagnostic Imaging Review  Cervical Imaging: Cervical DG Bending/F/E views:  Results for orders placed during the hospital encounter of 01/25/19  DG Cervical Spine With Flex & Extend   Narrative CLINICAL DATA:  Chronic neck pain.  EXAM: CERVICAL SPINE COMPLETE WITH FLEXION AND EXTENSION VIEWS  COMPARISON:  None.  FINDINGS: Mild grade 1 retrolisthesis of C4-5 is noted secondary to degenerative disc disease at this level. Severe degenerative disc disease is noted at C3-4 and C4-5 with anterior and posterior osteophyte formation. Mild degenerative disc disease is noted at C6-7. Mild grade 1 anterolisthesis of C7-T1 is noted secondary to posterior facet joint hypertrophy. No fracture is noted. No prevertebral soft tissue swelling is noted. No change in vertebral body alignment is noted on flexion or extension views. Mild bilateral neural foraminal stenosis is noted at C3-4, C4-5 and C5-6 secondary to uncovertebral spurring.  IMPRESSION: Multilevel degenerative disc disease is noted. Bilateral neural foraminal stenosis is noted at multiple levels secondary to uncovertebral spurring. No acute abnormality seen in the cervical spine.   Electronically Signed   By: Marijo Conception M.D.   On: 01/25/2019 11:08    Shoulder Imaging: Shoulder-L DG:  Results for orders placed during the hospital encounter of 01/25/19  DG Shoulder Left   Narrative CLINICAL DATA:  Chronic left shoulder pain.  EXAM: LEFT SHOULDER - 2+ VIEW  COMPARISON:  None.  FINDINGS: There is no evidence of fracture or dislocation. Mild narrowing of the glenohumeral joint is noted. Soft tissues are unremarkable.  IMPRESSION: Mild osteoarthritis of the left glenohumeral joint. No acute abnormality seen in the left shoulder.   Electronically Signed   By: Marijo Conception M.D.   On: 01/25/2019 11:16    Lumbosacral Imaging: Lumbar DG Bending views:  Results for orders placed during the hospital encounter of  01/25/19  DG Lumbar Spine Complete W/Bend   Narrative CLINICAL DATA:  Chronic low back pain.  EXAM: LUMBAR SPINE - COMPLETE WITH BENDING VIEWS  COMPARISON:  MRI of Feb 08, 2013.  FINDINGS: No fracture or spondylolisthesis is noted. Status post surgical stabilization of L4-5. Mild degenerative disc disease is noted at L2-3 and L3-4 with anterior osteophyte formation. Atherosclerosis of abdominal aorta is noted. No change in vertebral body alignment is noted on flexion or extension views.  IMPRESSION: Postsurgical and degenerative changes as described above. No acute abnormality seen in the lumbar spine.  Aortic Atherosclerosis (ICD10-I70.0).   Electronically Signed   By: Marijo Conception M.D.   On: 01/25/2019 11:14          Knee Imaging: Knee-L DG 1-2 views:  Results for orders placed during the hospital encounter of 01/25/19  DG Knee 1-2 Views Left   Narrative CLINICAL DATA:  Chronic left knee pain.  EXAM: LEFT KNEE - 1-2 VIEW  COMPARISON:  Radiographs of September 21, 2018.  FINDINGS: No evidence of fracture, dislocation, or joint effusion. Mild to moderate narrowing of medial joint space is noted. Soft tissues are unremarkable.  IMPRESSION: Mild-to-moderate degenerative joint disease is noted medially. No acute abnormality seen in the left knee.   Electronically Signed   By: Marijo Conception M.D.   On: 01/25/2019 11:17    Knee-L DG 4 views:  Results for orders placed during the hospital encounter of 09/21/18  DG Knee Complete 4 Views Left   Narrative CLINICAL DATA:  Fall today  EXAM: LEFT KNEE - COMPLETE 4+ VIEW  COMPARISON:  None.  FINDINGS: Negative for fracture or joint effusion.  Soft tissue swelling anteriorly.  Degenerative change in the medial joint compartment with subchondral cyst in the proximal tibia.  IMPRESSION: No acute abnormality.   Electronically Signed   By: Franchot Gallo M.D.   On: 09/21/2018 18:07    Foot  Imaging: Foot-R DG Complete:  Results for orders placed during the hospital encounter of 01/25/19  DG Foot Complete Right   Narrative CLINICAL DATA:  Chronic right foot pain.  EXAM: RIGHT FOOT COMPLETE - 3+ VIEW  COMPARISON:  None.  FINDINGS: There is no evidence of fracture or dislocation. There is no evidence of arthropathy or other focal bone abnormality. Soft tissues are unremarkable.  IMPRESSION: Negative.   Electronically Signed   By: Marijo Conception M.D.   On: 01/25/2019 11:11    Foot-L DG Complete:  Results for orders placed during the hospital encounter of 01/25/19  DG Foot Complete Left   Narrative CLINICAL DATA:  Chronic left foot pain.  EXAM: LEFT FOOT - COMPLETE 3+ VIEW  COMPARISON:  None.  FINDINGS: Old healed left second metatarsal fracture is noted. No acute fracture or dislocation is noted. Degenerative changes are seen involving the second, third and fourth tarsometatarsal joints as well as mild degenerative change involving the first metatarsophalangeal joint. Mild hallux valgus deformity of the first metatarsophalangeal joint is noted.  IMPRESSION: Degenerative changes as described above. Old healed second  metatarsal fracture. No acute fracture or dislocation is noted.   Electronically Signed   By: Marijo Conception M.D.   On: 01/25/2019 11:10    Complexity Note: Imaging results reviewed. Results shared with Ms. Stann Mainland, using Layman's terms.                         Assessment  The primary encounter diagnosis was Chronic feet pain (Primary area of Pain) (Bilateral) (L>R). Diagnoses of Sensory peripheral neuropathy, Neuropathic pain, Neuropathy, Chronic low back pain (Secondary Area of Pain) (Bilateral) (R>L) without sciatica, Cervicogenic headache (Tertiary Area of Pain) (Right), Chronic neck pain (Fourth Area of Pain) (Right), Chronic shoulder pain (Fifth Area of Pain) (Left), Chronic knee pain (Sixth Area of Pain) (Left), and Chronic pain  syndrome were also pertinent to this visit.  Plan of Care  I have discontinued Sarah Riddle's gabapentin. I am also having her start on pregabalin. Additionally, I am having her maintain her warfarin, pantoprazole, losartan, levocetirizine, isosorbide mononitrate, furosemide, fluticasone, ezetimibe, atorvastatin, allopurinol, metoprolol succinate, sertraline, Cranberry, Cyanocobalamin (VITAMIN B 12 PO), acetaminophen, amitriptyline, EPINEPHrine (EPIPEN IJ), ergocalciferol, alendronate, and OVER THE COUNTER MEDICATION. Pharmacotherapy (Medications Ordered): Meds ordered this encounter  Medications  . pregabalin (LYRICA) 75 MG capsule    Sig: Take 1 capsule (75 mg total) by mouth 3 (three) times daily for 30 days.    Dispense:  90 capsule    Refill:  0    Do not place medication on "Automatic Refill". Fill one day early if pharmacy is closed on scheduled refill date.   Procedure Orders    No procedure(s) ordered today   Lab Orders  No laboratory test(s) ordered today   Imaging Orders  No imaging studies ordered today   Referral Orders  No referral(s) requested today    Orders:  No orders of the defined types were placed in this encounter.  Pharmacological management options:  Opioid Analgesics: We'll take over management today. See above orders Membrane stabilizer: Options discussed, including a trial. Muscle relaxant: We have discussed the possibility of a trial NSAID: Trial discussed. Other analgesic(s): To be determined at a later time   Total duration of non-face-to-face encounter: 20 minutes.  Follow-up plan:   Return in about 1 month (around 03/18/2019) for Med-Mgmt, (Virtual Visit).    Future Appointments  Date Time Provider [place]  03/16/2019  9:20 AM [facility]    Primary Care Physician: Perrin Maltese, MD Location: Telephone Virtual Visit Note by: Gaspar Cola, MD Date: 02/15/2019; Time: 12:59 PM  Disclaimer:  * Given the  special circumstances of the COVID-19 pandemic, the federal government has announced that the Office for Civil Rights (OCR) will exercise its enforcement discretion and will not impose penalties on physicians using telehealth in the event of noncompliance with regulatory requirements under the [place] and Accountability Act (HIPAA) in connection with the good faith provision of telehealth during the ZOXWR-60 national public health emergency. ([place])

## 2019-02-15 ENCOUNTER — Ambulatory Visit: Payer: Medicare PPO | Attending: Pain Medicine | Admitting: Pain Medicine

## 2019-02-15 ENCOUNTER — Other Ambulatory Visit: Payer: Self-pay

## 2019-02-15 DIAGNOSIS — M545 Low back pain, unspecified: Secondary | ICD-10-CM

## 2019-02-15 DIAGNOSIS — M542 Cervicalgia: Secondary | ICD-10-CM

## 2019-02-15 DIAGNOSIS — G4486 Cervicogenic headache: Secondary | ICD-10-CM

## 2019-02-15 DIAGNOSIS — M79671 Pain in right foot: Secondary | ICD-10-CM | POA: Diagnosis not present

## 2019-02-15 DIAGNOSIS — G629 Polyneuropathy, unspecified: Secondary | ICD-10-CM

## 2019-02-15 DIAGNOSIS — R51 Headache: Secondary | ICD-10-CM

## 2019-02-15 DIAGNOSIS — M792 Neuralgia and neuritis, unspecified: Secondary | ICD-10-CM

## 2019-02-15 DIAGNOSIS — G894 Chronic pain syndrome: Secondary | ICD-10-CM

## 2019-02-15 DIAGNOSIS — G608 Other hereditary and idiopathic neuropathies: Secondary | ICD-10-CM

## 2019-02-15 DIAGNOSIS — M79672 Pain in left foot: Secondary | ICD-10-CM

## 2019-02-15 DIAGNOSIS — M25562 Pain in left knee: Secondary | ICD-10-CM

## 2019-02-15 DIAGNOSIS — G8929 Other chronic pain: Secondary | ICD-10-CM

## 2019-02-15 DIAGNOSIS — M25512 Pain in left shoulder: Secondary | ICD-10-CM

## 2019-02-15 MED ORDER — PREGABALIN 75 MG PO CAPS: 75.0000 mg | ORAL_CAPSULE | Freq: Three times a day (TID) | ORAL | 0 refills | Status: DC

## 2019-03-13 ENCOUNTER — Encounter: Payer: Self-pay | Admitting: Pain Medicine

## 2019-03-14 ENCOUNTER — Other Ambulatory Visit: Payer: Self-pay

## 2019-03-14 ENCOUNTER — Ambulatory Visit: Payer: Medicare PPO | Attending: Pain Medicine | Admitting: Pain Medicine

## 2019-03-14 DIAGNOSIS — M25512 Pain in left shoulder: Secondary | ICD-10-CM

## 2019-03-14 DIAGNOSIS — R51 Headache: Secondary | ICD-10-CM

## 2019-03-14 DIAGNOSIS — M545 Low back pain, unspecified: Secondary | ICD-10-CM

## 2019-03-14 DIAGNOSIS — G894 Chronic pain syndrome: Secondary | ICD-10-CM | POA: Diagnosis not present

## 2019-03-14 DIAGNOSIS — M79671 Pain in right foot: Secondary | ICD-10-CM | POA: Diagnosis not present

## 2019-03-14 DIAGNOSIS — G8929 Other chronic pain: Secondary | ICD-10-CM

## 2019-03-14 DIAGNOSIS — G608 Other hereditary and idiopathic neuropathies: Secondary | ICD-10-CM

## 2019-03-14 DIAGNOSIS — G4486 Cervicogenic headache: Secondary | ICD-10-CM

## 2019-03-14 DIAGNOSIS — G629 Polyneuropathy, unspecified: Secondary | ICD-10-CM

## 2019-03-14 DIAGNOSIS — M542 Cervicalgia: Secondary | ICD-10-CM

## 2019-03-14 DIAGNOSIS — M792 Neuralgia and neuritis, unspecified: Secondary | ICD-10-CM

## 2019-03-14 DIAGNOSIS — M25562 Pain in left knee: Secondary | ICD-10-CM

## 2019-03-14 DIAGNOSIS — M79672 Pain in left foot: Secondary | ICD-10-CM

## 2019-03-14 MED ORDER — PREGABALIN 75 MG PO CAPS: 75.0000 mg | ORAL_CAPSULE | Freq: Three times a day (TID) | ORAL | 1 refills | Status: DC

## 2019-03-14 NOTE — Progress Notes (Signed)
Pain Management Virtual Encounter Note - Virtual Visit via Telephone Telehealth (real-time audio visits between healthcare provider and patient).   Patient's Phone No. & Preferred Pharmacy:  [phone] (home); [phone] (mobile); (Preferred) [phone] [email or web address]  [facility] - [place], [place] [address] Phone: [phone] Fax: [phone]    Pre-screening note:  Our staff contacted Sarah Riddle and offered her an "in person", "face-to-face" appointment versus a telephone encounter. She indicated preferring the telephone encounter, at this time.   Reason for Virtual Visit: COVID-19*  Social distancing based on CDC and AMA recommendations.   I contacted Sarah Riddle on 03/14/2019 via telephone.      I clearly identified myself as Gaspar Cola, MD. I verified that I was speaking with the correct person using two identifiers (Name: Sarah Riddle, and date of birth: August 10, 1941).  Advanced Informed Consent I sought verbal advanced consent from Sarah Riddle for virtual visit interactions. I informed Sarah Riddle of possible security and privacy concerns, risks, and limitations associated with providing "not-in-person" medical evaluation and management services. I also informed Sarah Riddle of the availability of "in-person" appointments. Finally, I informed her that there would be a charge for the virtual visit and that she could be  personally, fully or partially, financially responsible for it. Sarah Riddle expressed understanding and agreed to proceed.   Historic Elements   Sarah Riddle is a 78 y.o. year old, female patient evaluated today after her last encounter by our practice on 02/15/2019. Sarah Riddle  has a past medical history of A-fib ([place]) and Cancer ([place]). She also  has a past surgical history that includes Breast cyst aspiration (Left, 2001); Cardiac electrophysiology mapping and  ablation; Back surgery; Abdominal hysterectomy; Total hip arthroplasty (Left); and Eye surgery. Sarah Riddle has a current medication list which includes the following prescription(s): acetaminophen, alendronate, allopurinol, amitriptyline, atorvastatin, cranberry, cyanocobalamin, epinephrine, ergocalciferol, ezetimibe, fluticasone, furosemide, isosorbide mononitrate, levocetirizine, losartan, metoprolol succinate, OVER THE COUNTER MEDICATION, pantoprazole, pregabalin, sertraline, and warfarin. She  reports that she has never smoked. She has never used smokeless tobacco. She reports that she does not drink alcohol or use drugs. Sarah Riddle is allergic to iodinated diagnostic agents; ibuprofen; lubiprostone; sulfa antibiotics; zolpidem; boniva [ibandronic acid]; celecoxib; duloxetine; and tetracyclines & related.   HPI  Today, she is being contacted for medication management.  The patient returns to the clinic today after having been started on Lyrica 75 mg 1 tablet p.o. 3 times daily.  At this point she is doing great with the medication and it has eliminated 75% of her pain.  She will like to stay on this dose and therefore I will go ahead and give her refills for the next 6 months.  Pharmacotherapy Assessment  Analgesic: Lyrica 75 mg 1 tablet p.o. 3 times daily (225 mg/day of pregabalin)  Monitoring: Pharmacotherapy: No side-effects or adverse reactions reported. [place] PMP: PDMP reviewed during this encounter.       Compliance: No problems identified. Effectiveness: Clinically acceptable. Plan: Refer to "POC".  Pertinent Labs   SAFETY SCREENING Profile No results found for: SARSCOV2NAA, COVIDSOURCE, STAPHAUREUS, MRSAPCR, HCVAB, HIV, PREGTESTUR Renal Function Lab Results  Component Value Date   BUN 21 01/25/2019   CREATININE 0.83 01/25/2019   GFRAA >60 01/25/2019   GFRNONAA >60 01/25/2019   Hepatic Function Lab Results  Component Value Date   AST 18 01/25/2019   ALT 13 01/25/2019   ALBUMIN  3.9 01/25/2019   UDS No results found for: SUMMARY Note: Above Lab results reviewed.  Recent imaging  DG Knee 1-2 Views Left CLINICAL DATA:  Chronic left knee pain.  EXAM: LEFT KNEE - 1-2 VIEW  COMPARISON:  Radiographs of September 21, 2018.  FINDINGS: No evidence of fracture, dislocation, or joint effusion. Mild to moderate narrowing of medial joint space is noted. Soft tissues are unremarkable.  IMPRESSION: Mild-to-moderate degenerative joint disease is noted medially. No acute abnormality seen in the left knee.  Electronically Signed   By: Marijo Conception M.D.   On: 01/25/2019 11:17 DG Shoulder Left CLINICAL DATA:  Chronic left shoulder pain.  EXAM: LEFT SHOULDER - 2+ VIEW  COMPARISON:  None.  FINDINGS: There is no evidence of fracture or dislocation. Mild narrowing of the glenohumeral joint is noted. Soft tissues are unremarkable.  IMPRESSION: Mild osteoarthritis of the left glenohumeral joint. No acute abnormality seen in the left shoulder.  Electronically Signed   By: Marijo Conception M.D.   On: 01/25/2019 11:16 DG Lumbar Spine Complete W/Bend CLINICAL DATA:  Chronic low back pain.  EXAM: LUMBAR SPINE - COMPLETE WITH BENDING VIEWS  COMPARISON:  MRI of Feb 08, 2013.  FINDINGS: No fracture or spondylolisthesis is noted. Status post surgical stabilization of L4-5. Mild degenerative disc disease is noted at L2-3 and L3-4 with anterior osteophyte formation. Atherosclerosis of abdominal aorta is noted. No change in vertebral body alignment is noted on flexion or extension views.  IMPRESSION: Postsurgical and degenerative changes as described above. No acute abnormality seen in the lumbar spine.  Aortic Atherosclerosis (ICD10-I70.0).  Electronically Signed   By: Marijo Conception M.D.   On: 01/25/2019 11:14 DG Foot Complete Right CLINICAL DATA:  Chronic right foot pain.  EXAM: RIGHT FOOT COMPLETE - 3+ VIEW  COMPARISON:  None.  FINDINGS: There  is no evidence of fracture or dislocation. There is no evidence of arthropathy or other focal bone abnormality. Soft tissues are unremarkable.  IMPRESSION: Negative.  Electronically Signed   By: Marijo Conception M.D.   On: 01/25/2019 11:11 DG Foot Complete Left CLINICAL DATA:  Chronic left foot pain.  EXAM: LEFT FOOT - COMPLETE 3+ VIEW  COMPARISON:  None.  FINDINGS: Old healed left second metatarsal fracture is noted. No acute fracture or dislocation is noted. Degenerative changes are seen involving the second, third and fourth tarsometatarsal joints as well as mild degenerative change involving the first metatarsophalangeal joint. Mild hallux valgus deformity of the first metatarsophalangeal joint is noted.  IMPRESSION: Degenerative changes as described above. Old healed second metatarsal fracture. No acute fracture or dislocation is noted.  Electronically Signed   By: Marijo Conception M.D.   On: 01/25/2019 11:10 DG Cervical Spine With Flex & Extend CLINICAL DATA:  Chronic neck pain.  EXAM: CERVICAL SPINE COMPLETE WITH FLEXION AND EXTENSION VIEWS  COMPARISON:  None.  FINDINGS: Mild grade 1 retrolisthesis of C4-5 is noted secondary to degenerative disc disease at this level. Severe degenerative disc disease is noted at C3-4 and C4-5 with anterior and posterior osteophyte formation. Mild degenerative disc disease is noted at C6-7. Mild grade 1 anterolisthesis of C7-T1 is noted secondary to posterior facet joint hypertrophy. No fracture is noted. No prevertebral soft tissue swelling is noted. No change in vertebral body alignment is noted on flexion or extension views. Mild bilateral neural foraminal stenosis is noted at C3-4, C4-5 and C5-6 secondary to uncovertebral spurring.  IMPRESSION: Multilevel degenerative disc disease is noted.  Bilateral neural foraminal stenosis is noted at multiple levels secondary to uncovertebral spurring. No acute abnormality seen in  the cervical spine.  Electronically Signed   By: Marijo Conception M.D.   On: 01/25/2019 11:08  Assessment  The primary encounter diagnosis was Chronic pain syndrome. Diagnoses of Chronic feet pain (Primary area of Pain) (Bilateral) (L>R), Chronic low back pain (Secondary Area of Pain) (Bilateral) (R>L) without sciatica, Cervicogenic headache (Tertiary Area of Pain) (Right), Chronic neck pain (Fourth Area of Pain) (Right), Chronic shoulder pain (Fifth Area of Pain) (Left), Chronic knee pain (Sixth Area of Pain) (Left), Neuropathy, Sensory peripheral neuropathy, and Neuropathic pain were also pertinent to this visit.  Plan of Care  I have changed Sarah Riddle. Sarah Riddle pregabalin. I am also having her maintain her warfarin, pantoprazole, losartan, levocetirizine, isosorbide mononitrate, furosemide, fluticasone, ezetimibe, atorvastatin, allopurinol, metoprolol succinate, sertraline, Cranberry, Cyanocobalamin (VITAMIN B 12 PO), acetaminophen, amitriptyline, EPINEPHrine (EPIPEN IJ), ergocalciferol, alendronate, and OVER THE COUNTER MEDICATION.  Pharmacotherapy (Medications Ordered): Meds ordered this encounter  Medications  . pregabalin (LYRICA) 75 MG capsule    Sig: Take 1 capsule (75 mg total) by mouth 3 (three) times daily. Must last 30 days    Dispense:  270 capsule    Refill:  1    Fill one day early if pharmacy is closed on scheduled refill date. May substitute for generic if available.   Orders:  No orders of the defined types were placed in this encounter.  Follow-up plan:   Return in about 6 months (around 09/13/2019) for (Virtual), E/M, (Med-Mgmt).    I discussed the assessment and treatment plan with the patient. The patient was provided an opportunity to ask questions and all were answered. The patient agreed with the plan and demonstrated an understanding of the instructions.  Patient advised to call back or seek an in-person evaluation if the symptoms or condition worsens.  Total  duration of non-face-to-face encounter: 15 minutes.  Note by: Gaspar Cola, MD Date: 03/14/2019; Time: 10:08 AM  Note: This dictation was prepared with Dragon dictation. Any transcriptional errors that may result from this process are unintentional.  Disclaimer:  * Given the special circumstances of the COVID-19 pandemic, the federal government has announced that the Office for Civil Rights (OCR) will exercise its enforcement discretion and will not impose penalties on physicians using telehealth in the event of noncompliance with regulatory requirements under the [place] and [place] (HIPAA) in connection with the good faith provision of telehealth during the AGTXM-46 national public health emergency. ([place])

## 2019-03-16 ENCOUNTER — Ambulatory Visit
Admission: RE | Admit: 2019-03-16 | Discharge: 2019-03-16 | Disposition: A | Discharge status: Home / Self Care | Payer: Medicare PPO | Source: Ambulatory Visit | Attending: Nurse Practitioner | Admitting: Nurse Practitioner

## 2019-03-16 ENCOUNTER — Other Ambulatory Visit: Payer: Self-pay

## 2019-03-16 DIAGNOSIS — Z1231 Encounter for screening mammogram for malignant neoplasm of breast: Secondary | ICD-10-CM | POA: Diagnosis not present

## 2019-05-14 ENCOUNTER — Telehealth: Payer: Self-pay

## 2019-05-14 NOTE — Telephone Encounter (Signed)
Started in error

## 2019-05-14 NOTE — Telephone Encounter (Signed)
The patient is having throwing up and diarrhea and she thinks its because of the lyrica. Can someone call her to discuss this. It is helping her but she is having these side affects once in a while and getting more regularly.

## 2019-05-14 NOTE — Telephone Encounter (Signed)
Spoke with patient.  Patient states she has been taking the Lyrica for approximately 2 months and has had some diarrhea which she discussed with Dr Dossie Arbour during last visit.  She has started having some vomiting as well and the frequency of these has increased. .  She states she feels the Lyrica is helping her neuropathy a lot and feels the benefits outweigh the side effects at this point.   Dr Dossie Arbour do you have any input or changes that you would like to make.

## 2019-07-02 ENCOUNTER — Other Ambulatory Visit: Payer: Self-pay

## 2019-07-02 DIAGNOSIS — Z20822 Contact with and (suspected) exposure to covid-19: Secondary | ICD-10-CM

## 2019-07-03 LAB — NOVEL CORONAVIRUS, NAA: SARS-CoV-2, NAA: NOT DETECTED

## 2019-07-09 ENCOUNTER — Other Ambulatory Visit: Payer: Self-pay

## 2019-07-09 DIAGNOSIS — Z20822 Contact with and (suspected) exposure to covid-19: Secondary | ICD-10-CM

## 2019-07-11 LAB — NOVEL CORONAVIRUS, NAA: SARS-CoV-2, NAA: DETECTED — AB

## 2019-09-06 ENCOUNTER — Encounter: Payer: Self-pay | Admitting: Pain Medicine

## 2019-09-09 NOTE — Progress Notes (Signed)
Pain Management Virtual Encounter Note - Virtual Visit via Telephone Telehealth (real-time audio visits between healthcare provider and Riddle).   Riddle's Phone No. & Preferred Pharmacy:  [phone] (home); [phone] (mobile); (Preferred) [phone] [email or web address]  [facility] - [place], [place] [address] Phone: [phone] Fax: [phone]    Pre-screening note:  Our staff contacted Sarah Riddle and offered her an "in person", "face-to-face" appointment versus a telephone encounter. She indicated preferring Sarah telephone encounter, at this time.   Reason for Virtual Visit: COVID-19*  Social distancing based on CDC and AMA recommendations.   I contacted Sarah Riddle on 09/10/2019 via telephone.      I clearly identified myself as Gaspar Cola, MD. I verified that I was speaking with Sarah correct person using two identifiers (Name: Sarah Riddle, and date of birth: [birth year]).  Advanced Informed Consent I sought verbal advanced consent from Sarah Riddle for virtual visit interactions. I informed Sarah Riddle of possible security and privacy concerns, risks, and limitations associated with providing "not-in-person" medical evaluation and management services. I also informed Sarah Riddle of Sarah availability of "in-person" appointments. Finally, I informed her that there would be a charge for Sarah virtual visit and that she could be  personally, fully or partially, financially responsible for it. Sarah Riddle expressed understanding and agreed to proceed.   Historic Elements   Sarah Riddle is a 78 y.o. year old, female Riddle evaluated today after her last encounter by our practice on 05/14/2019. Sarah Riddle  has a past medical history of A-fib ([place]) and Cancer ([place]). She also  has a past surgical history that includes Cardiac electrophysiology mapping and ablation; Back surgery; Abdominal  hysterectomy; Total hip arthroplasty (Left); Eye surgery; and Breast cyst aspiration (Left, 2001). Sarah Riddle has a current medication list which includes Sarah following prescription(s): acetaminophen, alendronate, allopurinol, amitriptyline, atorvastatin, cranberry, cyanocobalamin, epinephrine, ergocalciferol, ezetimibe, fluticasone, furosemide, isosorbide mononitrate, levocetirizine, losartan, metoprolol succinate, OVER Sarah COUNTER MEDICATION, pantoprazole, pregabalin, sertraline, and warfarin. She  reports that she has never smoked. She has never used smokeless tobacco. She reports that she does not drink alcohol or use drugs. Sarah Riddle is allergic to iodinated diagnostic agents; ibuprofen; lubiprostone; sulfa antibiotics; zolpidem; boniva [ibandronic acid]; celecoxib; duloxetine; and tetracyclines & related.   HPI  Today, she is being contacted for medication management.  Today we reviewed Sarah effectiveness of her Lyrica.  She seems to be doing extremely well with it and describes her pain to be 60 to 75% improved with Sarah use of Sarah medication. Sarah Riddle indicates doing well with Sarah current medication regimen. No adverse reactions or side effects reported to Sarah medications.  She does admit to a mild degree of memory impairment and therefore we will not make any further increases in Sarah Lyrica until she gets used to it.  Sarah Riddle indicates occasionally experiencing some dizzy spells associated with neck pain and bilateral upper extremity numbness with Sarah right side being worse than Sarah left.  She does have an x-ray of Sarah cervical spine positive for degenerative disc disease, retrolisthesis and anterolisthesis as well as facet hypertrophy and foraminal stenosis.  In view of Sarah fact that Sarah Riddle indicates that this is slowly getting worse, we will go ahead and order an MRI of Sarah cervical spine to further evaluate for soft tissue pathology.  She indicates having had a carotid study done recently  to determine if she  had carotid stenosis but she was found to be negative.  We will follow-up with her after Sarah MRI to see if there is anything else that we need to do for her.  Pharmacotherapy Assessment  Analgesic: Lyrica 75 mg, 1 tab PO TID (225 mg/day of pregabalin)   Monitoring: Pharmacotherapy: No side-effects or adverse reactions reported. [place] PMP: PDMP reviewed during this encounter.       Compliance: No problems identified. Effectiveness: Clinically acceptable. Plan: Refer to "POC".  UDS: No results found for: SUMMARY   Laboratory Chemistry Profile (12 mo)  Renal: 01/25/2019: BUN 21; Creatinine, Ser 0.83  Lab Results  Component Value Date   GFRAA >60 01/25/2019   GFRNONAA >60 01/25/2019   Hepatic: 01/25/2019: Albumin 3.9 Lab Results  Component Value Date   AST 18 01/25/2019   ALT 13 01/25/2019   Other: 01/25/2019: 25-Hydroxy, Vitamin D 68; 25-Hydroxy, Vitamin D-2 65; 25-Hydroxy, Vitamin D-3 2.9; CRP <0.8; Sed Rate 9; Vitamin B-12 432 Note: Above Lab results reviewed.  Imaging  MM 3D SCREEN BREAST BILATERAL CLINICAL DATA:  Screening.  EXAM: DIGITAL SCREENING BILATERAL MAMMOGRAM WITH TOMO AND CAD  COMPARISON:  Previous exam(s).  ACR Breast Density Category b: There are scattered areas of fibroglandular density.  FINDINGS: There are no findings suspicious for malignancy. Images were processed with CAD.  IMPRESSION: No mammographic evidence of malignancy. A result letter of this screening mammogram will be mailed directly to Sarah Riddle.  RECOMMENDATION: Screening mammogram in one year. (Code:[id number]-B-01Y)  BI-RADS CATEGORY  1: Negative.  Electronically Signed   By: Sarah Riddle M.D.   On: 03/16/2019 11:06   Assessment  Sarah primary encounter diagnosis was Chronic pain syndrome. Diagnoses of Chronic feet pain (Primary area of Pain) (Bilateral) (L>R), Chronic low back pain (Secondary Area of Pain) (Bilateral) (R>L) without sciatica, Cervicogenic headache  (Tertiary Area of Pain) (Right), Chronic neck pain (Fourth Area of Pain) (Right), Chronic shoulder pain (Fifth Area of Pain) (Left), Chronic knee pain (Sixth Area of Pain) (Left), Neuropathy, Sensory peripheral neuropathy, Neuropathic pain, Cervicalgia, DDD (degenerative disc disease), cervical, Cervical foraminal stenosis (Bilateral) (C3-4, C4-5, C5-6), Cervical grade 1 Anterolisthesis of C7/T1, Cervical grade 1 Retrolisthesis of C4/C5, and Cervical facet hypertrophy were also pertinent to this visit.  Plan of Care  Problem-specific:  No problem-specific Assessment & Plan notes found for this encounter.  I am having Sarah Riddle. Sarah Riddle maintain her warfarin, pantoprazole, losartan, levocetirizine, isosorbide mononitrate, furosemide, fluticasone, ezetimibe, atorvastatin, allopurinol, metoprolol succinate, sertraline, Cranberry, Cyanocobalamin (VITAMIN B 12 PO), acetaminophen, amitriptyline, EPINEPHrine (EPIPEN IJ), ergocalciferol, alendronate, OVER Sarah COUNTER MEDICATION, and pregabalin.  Pharmacotherapy (Medications Ordered): Meds ordered this encounter  Medications  . pregabalin (LYRICA) 75 MG capsule    Sig: Take 1 capsule (75 mg total) by mouth 3 (three) times daily. Must last 30 days    Dispense:  90 capsule    Refill:  5    Fill one day early if pharmacy is closed on scheduled refill date. May substitute for generic if available.   Orders:  Orders Placed This Encounter  Procedures  . MRI C-Spine w/o contrast    Riddle presents with axial pain with possible radicular component.  In addition to any acute findings, please report on:  1. Facet (Zygapophyseal) joint DJD (Hypertrophy, space narrowing, subchondral sclerosis, and/or osteophyte formation) 2. DDD and/or IVDD (Loss of disc height, desiccation or "Black disc disease") 3. Pars defects 4. Spondylolisthesis, spondylosis, and/or spondyloarthropathies (include Degree/Grade of displacement in mm) 5. Vertebral body Fractures, including  age (old, new/acute) 72. Modic Type Changes 7. Demineralization 8. Bone pathology 9. Central, Lateral Recess, and/or Foraminal Stenosis (include AP diameter of stenosis in mm) 10. Surgical changes (hardware type, status, and presence of fibrosis) NOTE: Please specify level(s) and laterality.    Standing Status:   Future    Standing Expiration Date:   12/09/2019    Order Specific Question:   What is Sarah Riddle's sedation requirement?    Answer:   No Sedation    Order Specific Question:   Does Sarah Riddle have a pacemaker or implanted devices?    Answer:   No    Order Specific Question:   Preferred imaging location?    Answer:   [facility] (table limit-350lbs)    Order Specific Question:   Call Results- Best Contact Number?    Answer:   [phone] ([facility])    Order Specific Question:   Radiology Contrast Protocol - do NOT remove file path    Answer:   [email or web address]    Order Specific Question:   ** REASON FOR EXAM (FREE TEXT)    Answer:   Neck Pain & Radiculitis   Follow-up plan:   Return in about 6 months (around 03/10/2020) for (VV), E/M to follow-up with cervical MRI, (MM).      Interventional management options: Ms. Grumbine was informed that there is no guarantee that she would be a candidate for interventional therapies. Sarah decision will be based on Sarah results of diagnostic studies, as well as Ms. Pietro's risk profile.  Procedure(s) under consideration:  Today we will be ordering some lab work and x-rays and depending on those results, then we will narrow down what we could offer this Riddle.  Based on prior history, she may be a candidate for: Diagnostic bilateral injections in her feet  Diagnostic bilateral lumbar facet blocks  Diagnostic right-sided caudal epidural steroid injection plus diagnostic epidurogram  Diagnostic right-sided lesser occipital nerve blocks  Diagnostic right-sided cervical facet blocks  Diagnostic  left shoulder injection  Diagnostic left suprascapular nerve block  Diagnostic left intra-articular knee injection  Diagnostic left genicular nerve blocks     Recent Visits No visits were found meeting these conditions.  Showing recent visits within past 90 days and meeting all other requirements   Today's Visits Date Type Provider Dept  09/10/19 Telemedicine Milinda Pointer, MD [facility]  Showing today's visits and meeting all other requirements   Future Appointments No visits were found meeting these conditions.  Showing future appointments within next 90 days and meeting all other requirements   I discussed Sarah assessment and treatment plan with Sarah Riddle. Sarah Riddle was provided an opportunity to ask questions and all were answered. Sarah Riddle agreed with Sarah plan and demonstrated an understanding of Sarah instructions.  Riddle advised to call back or seek an in-person evaluation if Sarah symptoms or condition worsens.  Total duration of non-face-to-face encounter: 15 minutes.  Note by: Gaspar Cola, MD Date: 09/10/2019; Time: 9:36 AM  Note: This dictation was prepared with Dragon dictation. Any transcriptional errors that may result from this process are unintentional.  Disclaimer:  * Given Sarah special circumstances of Sarah COVID-19 pandemic, Sarah federal government has announced that Sarah Office for Civil Rights (OCR) will exercise its enforcement discretion and will not impose penalties on physicians using telehealth in Sarah event of noncompliance with regulatory requirements under Sarah [place] and [place] (HIPAA) in connection with Sarah good faith provision of telehealth during  Sarah VHQIO-96 national public health emergency. ([place])

## 2019-09-10 ENCOUNTER — Other Ambulatory Visit: Payer: Self-pay

## 2019-09-10 ENCOUNTER — Ambulatory Visit: Payer: Medicare PPO | Attending: Pain Medicine | Admitting: Pain Medicine

## 2019-09-10 DIAGNOSIS — M545 Low back pain, unspecified: Secondary | ICD-10-CM

## 2019-09-10 DIAGNOSIS — G894 Chronic pain syndrome: Secondary | ICD-10-CM

## 2019-09-10 DIAGNOSIS — R519 Headache, unspecified: Secondary | ICD-10-CM | POA: Diagnosis not present

## 2019-09-10 DIAGNOSIS — G608 Other hereditary and idiopathic neuropathies: Secondary | ICD-10-CM

## 2019-09-10 DIAGNOSIS — M792 Neuralgia and neuritis, unspecified: Secondary | ICD-10-CM

## 2019-09-10 DIAGNOSIS — M25512 Pain in left shoulder: Secondary | ICD-10-CM

## 2019-09-10 DIAGNOSIS — M5030 Other cervical disc degeneration, unspecified cervical region: Secondary | ICD-10-CM

## 2019-09-10 DIAGNOSIS — G629 Polyneuropathy, unspecified: Secondary | ICD-10-CM

## 2019-09-10 DIAGNOSIS — M79671 Pain in right foot: Secondary | ICD-10-CM

## 2019-09-10 DIAGNOSIS — G4486 Cervicogenic headache: Secondary | ICD-10-CM

## 2019-09-10 DIAGNOSIS — M25562 Pain in left knee: Secondary | ICD-10-CM

## 2019-09-10 DIAGNOSIS — G8929 Other chronic pain: Secondary | ICD-10-CM

## 2019-09-10 DIAGNOSIS — M4310 Spondylolisthesis, site unspecified: Secondary | ICD-10-CM

## 2019-09-10 DIAGNOSIS — M4802 Spinal stenosis, cervical region: Secondary | ICD-10-CM

## 2019-09-10 DIAGNOSIS — M79672 Pain in left foot: Secondary | ICD-10-CM

## 2019-09-10 DIAGNOSIS — M47812 Spondylosis without myelopathy or radiculopathy, cervical region: Secondary | ICD-10-CM

## 2019-09-10 DIAGNOSIS — M542 Cervicalgia: Secondary | ICD-10-CM

## 2019-09-10 MED ORDER — PREGABALIN 75 MG PO CAPS: 75.0000 mg | ORAL_CAPSULE | Freq: Three times a day (TID) | ORAL | 5 refills | Status: DC

## 2019-09-11 ENCOUNTER — Telehealth: Payer: Self-pay | Admitting: *Deleted

## 2019-09-12 ENCOUNTER — Ambulatory Visit: Payer: Medicare PPO | Admitting: Pain Medicine

## 2019-09-24 ENCOUNTER — Ambulatory Visit
Admission: RE | Admit: 2019-09-24 | Discharge: 2019-09-24 | Disposition: A | Discharge status: Home / Self Care | Payer: Medicare PPO | Source: Ambulatory Visit | Attending: Pain Medicine | Admitting: Pain Medicine

## 2019-09-24 ENCOUNTER — Other Ambulatory Visit: Payer: Self-pay

## 2019-09-24 DIAGNOSIS — M4310 Spondylolisthesis, site unspecified: Secondary | ICD-10-CM

## 2019-09-24 DIAGNOSIS — G4486 Cervicogenic headache: Secondary | ICD-10-CM

## 2019-09-24 DIAGNOSIS — R519 Headache, unspecified: Secondary | ICD-10-CM | POA: Insufficient documentation

## 2019-09-24 DIAGNOSIS — M4802 Spinal stenosis, cervical region: Secondary | ICD-10-CM | POA: Insufficient documentation

## 2019-09-24 DIAGNOSIS — M5030 Other cervical disc degeneration, unspecified cervical region: Secondary | ICD-10-CM | POA: Insufficient documentation

## 2019-09-24 DIAGNOSIS — M542 Cervicalgia: Secondary | ICD-10-CM | POA: Diagnosis not present

## 2019-09-24 DIAGNOSIS — M47812 Spondylosis without myelopathy or radiculopathy, cervical region: Secondary | ICD-10-CM | POA: Insufficient documentation

## 2019-09-26 NOTE — Progress Notes (Signed)
Consider: Sarah Riddle

## 2019-10-31 ENCOUNTER — Telehealth: Payer: Self-pay | Admitting: Pain Medicine

## 2019-10-31 NOTE — Telephone Encounter (Signed)
Are you willing to do this? 

## 2019-10-31 NOTE — Telephone Encounter (Signed)
Alex from [employer] stating there has been a delay on shipment of pregabalin. Asking if an emergency short supply can be sent to [place]. Please call [place] if any questions.

## 2019-11-19 ENCOUNTER — Ambulatory Visit: Payer: Medicare PPO | Attending: Internal Medicine

## 2019-11-19 DIAGNOSIS — Z23 Encounter for immunization: Secondary | ICD-10-CM | POA: Insufficient documentation

## 2019-11-19 NOTE — Progress Notes (Signed)
   Covid-19 Vaccination Clinic  Name:  Sarah Riddle    MRN: [id number] DOB: 1941/03/11  11/19/2019  Sarah Riddle was observed post Covid-19 immunization for 15 minutes without incidence. She was provided with Vaccine Information Sheet and instruction to access the V-Safe system.   Sarah Riddle was instructed to call 911 with any severe reactions post vaccine: Marland Kitchen Difficulty breathing  . Swelling of your face and throat  . A fast heartbeat  . A bad rash all over your body  . Dizziness and weakness    Immunizations Administered    Name Date Dose VIS Date Route   Pfizer COVID-19 Vaccine 11/19/2019  9:32 AM 0.3 mL 09/14/2019 Intramuscular   Manufacturer: [place]   Lot: [id number]   [place]: [id number]

## 2019-12-11 NOTE — Telephone Encounter (Signed)
Sorry, no additional scripts.

## 2019-12-18 ENCOUNTER — Ambulatory Visit: Payer: Medicare PPO | Attending: Internal Medicine

## 2019-12-18 DIAGNOSIS — Z23 Encounter for immunization: Secondary | ICD-10-CM

## 2019-12-18 NOTE — Progress Notes (Signed)
   Covid-19 Vaccination Clinic  Name:  Sarah Riddle    MRN: [id number] DOB: November 21, 1940  12/18/2019  [name] was observed post Covid-19 immunization for 15 minutes without incident. She was provided with Vaccine Information Sheet and instruction to access the V-Safe system.   [name] was instructed to call 911 with any severe reactions post vaccine: Marland Kitchen Difficulty breathing  . Swelling of face and throat  . A fast heartbeat  . A bad rash all over body  . Dizziness and weakness   Immunizations Administered    Name Date Dose VIS Date Route   Pfizer COVID-19 Vaccine 12/18/2019  9:10 AM 0.3 mL 09/14/2019 Intramuscular   Manufacturer: [place]   Lot: [id number]   [place]: [id number]

## 2020-02-14 ENCOUNTER — Other Ambulatory Visit: Payer: Self-pay | Admitting: Internal Medicine

## 2020-02-14 DIAGNOSIS — Z1231 Encounter for screening mammogram for malignant neoplasm of breast: Secondary | ICD-10-CM

## 2020-03-07 NOTE — Progress Notes (Signed)
Patient: Sarah Riddle  Service Category: E/M  Provider: Gaspar Cola, MD  DOB: 08-31-1941  DOS: 03/10/2020  Location: Office  MRN: [id number]  Setting: Ambulatory outpatient  Referring Provider: Perrin Maltese, MD  Type: Established Patient  Specialty: Interventional Pain Management  PCP: Perrin Maltese, MD  Location: Remote location  Delivery: TeleHealth     Virtual Encounter - Pain Management PROVIDER NOTE: Information contained herein reflects review and annotations entered in association with encounter. Interpretation of such information and data should be left to medically-trained personnel. Information provided to patient can be located elsewhere in the medical record under "Patient Instructions". Document created using STT-dictation technology, any transcriptional errors that may result from process are unintentional.    Contact & Pharmacy Preferred: [phone] Home: [phone] (home) Mobile: [phone] (mobile) E-mail: [email or web address]  [facility] - [place], [place] [address] Phone: [phone] Fax: [phone]   Pre-screening  Ms. Stann Mainland offered "in-person" vs "virtual" encounter. She indicated preferring virtual for this encounter.   Reason COVID-19*  Social distancing based on CDC and AMA recommendations.   I contacted Sarah Riddle on 03/10/2020 via telephone.      I clearly identified myself as Gaspar Cola, MD. I verified that I was speaking with the correct person using two identifiers (Name: Sarah Riddle, and date of birth: 1941/07/24).  Consent I sought verbal advanced consent from [name] for virtual visit interactions. I informed Ms. Sluder of possible security and privacy concerns, risks, and limitations associated with providing "not-in-person" medical evaluation and management services. I also informed Ms. Alper of the availability of "in-person"  appointments. Finally, I informed her that there would be a charge for the virtual visit and that she could be  personally, fully or partially, financially responsible for it. Ms. Coto expressed understanding and agreed to proceed.   Historic Elements   Ms. Sarah Riddle is a 79 y.o. year old, female patient evaluated today after her last contact with our practice on 10/31/2019. Ms. Mooneyham  has a past medical history of A-fib ([place]) and Cancer ([place]). She also  has a past surgical history that includes Cardiac electrophysiology mapping and ablation; Back surgery; Abdominal hysterectomy; Total hip arthroplasty (Left); Eye surgery; and Breast cyst aspiration (Left, 2001). Ms. Reaume has a current medication list which includes the following prescription(s): acetaminophen, alendronate, allopurinol, amitriptyline, cranberry, cyanocobalamin, epinephrine, ergocalciferol, ezetimibe, fluticasone, furosemide, isosorbide mononitrate, levocetirizine, losartan, metoprolol succinate, nitroglycerin, OVER THE COUNTER MEDICATION, pantoprazole, pregabalin, rosuvastatin, sertraline, warfarin, atorvastatin, and gabapentin. She  reports that she has never smoked. She has never used smokeless tobacco. She reports that she does not drink alcohol or use drugs. Ms. Ludolph is allergic to iodinated diagnostic agents; ibuprofen; lubiprostone; sulfa antibiotics; zolpidem; boniva [ibandronic acid]; celecoxib; duloxetine; and tetracyclines & related.   HPI  Today, she is being contacted for medication management. The patient indicates doing well with the current medication regimen. No adverse reactions or side effects reported to the medications.  However, she has noticed that the Lyrica does not work as well as the Neurontin.  In addition, she has also experienced a 20 to 22 pound weight increase.  Reviewing the effectiveness of the Lyrica in comparison to the Neurontin, we have come to the conclusion that we need to go back to the  Neurontin since it was providing her with similar benefit but less side effects.  The patient indicates that she was at  800 mg 4 times daily and therefore we will go back to her prior dose.  In addition to the above, we went over the results of her cervical MRI. FINDINGS: Alignment: Slight reversal of the upper cervical lordosis.  Disc levels: C2-3: Slight bilateral facet arthritis. Slight narrowing of the right neural foramen. C3-4: Disc space narrowing. Broad-based disc osteophyte complex asymmetric to the right protruding into the right neural foramen creating slight right foraminal stenosis. C4-5: Marked disc space narrowing. Small broad-based endplate osteophytes asymmetric to the left creating severe left foraminal stenosis. C5-6: Chronic disc space narrowing. Broad-based disc osteophyte complex extending into both neural foramina creating bilateral foraminal stenosis, right greater than left. C6-7: Endplate osteophytes extend into the proximal left neural foramen with slight left foraminal stenosis. C7-T1: Disc space narrowing. Slight left facet arthritis.  IMPRESSION: 1. Severe left foraminal stenosis at C4-5. 2. Bilateral foraminal stenosis at C5-6 which could affect either or both C6 nerves.  Based on these results, we have developed a plan of care were we will start by doing a left-sided cervical epidural steroid injection under fluoroscopic guidance and IV sedation but in addition to this, we will be consulting a neurosurgeon so that they can provide her with some information as to whether or not she would be a good candidate for surgery.  Pharmacotherapy Assessment  Analgesic: No opioid analgesics prescribed by our practice.   Monitoring: [place] PMP: PDMP reviewed during this encounter.       Pharmacotherapy: No side-effects or adverse reactions reported. Compliance: No problems identified. Effectiveness: Clinically acceptable. Plan: Refer to "POC".  UDS: No results found for:  SUMMARY  Laboratory Chemistry Profile   Renal Lab Results  Component Value Date   BUN 21 01/25/2019   CREATININE 0.83 01/25/2019   GFRAA >60 01/25/2019   GFRNONAA >60 01/25/2019     Hepatic Lab Results  Component Value Date   AST 18 01/25/2019   ALT 13 01/25/2019   ALBUMIN 3.9 01/25/2019   ALKPHOS 67 01/25/2019     Electrolytes Lab Results  Component Value Date   NA 142 01/25/2019   K 3.8 01/25/2019   CL 104 01/25/2019   CALCIUM 9.6 01/25/2019   MG 1.8 01/25/2019     Bone Lab Results  Component Value Date   25OHVITD1 68 01/25/2019   25OHVITD2 65 01/25/2019   25OHVITD3 2.9 01/25/2019     Inflammation (CRP: Acute Phase) (ESR: Chronic Phase) Lab Results  Component Value Date   CRP <0.8 01/25/2019   ESRSEDRATE 9 01/25/2019   LATICACIDVEN 0.9 08/25/2017       Note: Above Lab results reviewed.   Imaging  MRI C-Spine w/o contrast CLINICAL DATA:  Cervicalgia. Radiculitis. Cervicogenic headaches. Bilateral shoulder pain.  EXAM: MRI CERVICAL SPINE WITHOUT CONTRAST  TECHNIQUE: Multiplanar, multisequence MR imaging of the cervical spine was performed. No intravenous contrast was administered.  COMPARISON:  Cervical radiographs dated 01/25/2019  FINDINGS: Alignment: Slight reversal of the upper cervical lordosis.  Vertebrae: No fracture, evidence of discitis, or bone lesion.  Cord: Normal signal and morphology.  Posterior Fossa, vertebral arteries, paraspinal tissues: Negative.  Disc levels:  C2-3: Normal disc. Slight bilateral facet arthritis. Slight narrowing of the right neural foramen.  C3-4: Disc space narrowing. Broad-based disc osteophyte complex asymmetric to the right protruding into the right neural foramen creating slight right foraminal stenosis. No focal neural impingement.  C4-5: Marked disc space narrowing. Small broad-based endplate osteophytes asymmetric to the left creating severe left foraminal stenosis.  C5-6: Chronic  disc  space narrowing. Broad-based disc osteophyte complex extending into both neural foramina creating bilateral foraminal stenosis, right greater than left.  C6-7: Endplate osteophytes extend into the proximal left neural foramen with slight left foraminal stenosis.  C7-T1: Disc space narrowing. No significant disc bulging or protrusion. Widely patent neural foramina. Slight left facet arthritis.  IMPRESSION: 1. Severe left foraminal stenosis at C4-5. 2. Bilateral foraminal stenosis at C5-6 which could affect either or both C6 nerves.  Electronically Signed   By: Lorriane Shire M.D.   On: 09/24/2019 10:33  Assessment  The primary encounter diagnosis was Chronic pain syndrome. Diagnoses of Chronic shoulder pain (Fifth Area of Pain) (Left), Radicular pain of shoulder (Left), Cervical foraminal stenosis (Bilateral) (C3-4, C4-5, C5-6), Cervicalgia (Right), Cervical grade 1 Retrolisthesis of C4/C5, Cervical grade 1 Anterolisthesis of C7/T1, DDD (degenerative disc disease), cervical, Abnormal MRI, cervical spine (09/24/2019), Chronic feet pain (Primary area of Pain) (Bilateral) (L>R), Sensory peripheral neuropathy, Neuropathy, Neurogenic pain, and Chronic anticoagulation (Coumadin) were also pertinent to this visit.  Plan of Care  Problem-specific:  No problem-specific Assessment & Plan notes found for this encounter.  Ms. JOSEPHENE MARRONE has a current medication list which includes the following long-term medication(s): allopurinol, amitriptyline, ezetimibe, fluticasone, furosemide, isosorbide mononitrate, levocetirizine, losartan, metoprolol succinate, nitroglycerin, pantoprazole, pregabalin, rosuvastatin, sertraline, warfarin, atorvastatin, and gabapentin.  Pharmacotherapy (Medications Ordered): Meds ordered this encounter  Medications  . gabapentin (NEURONTIN) 800 MG tablet    Sig: Take 1 tablet (800 mg total) by mouth in the morning, at noon, in the evening, and at bedtime.    Dispense:   360 tablet    Refill:  1    Fill one day early if pharmacy is closed on scheduled refill date. May substitute for generic if available.   Orders:  Orders Placed This Encounter  Procedures  . Cervical Epidural Injection    Level(s): C7-T1 Laterality: Left-sided Purpose: Diagnostic/Therapeutic Indication(s): Radiculitis and cervicalgia associater with cervical degenerative disc disease.    Standing Status:   Future    Standing Expiration Date:   04/09/2020    Scheduling Instructions:     Procedure: Cervical Epidural Steroid Injection/Block     Sedation: Patient's choice.     Timeframe: As soon as schedule allows    Order Specific Question:   Where will this procedure be performed?    Answer:   [facility]    Comments:   by Dr. Dossie Arbour  . Ambulatory referral to Neurosurgery    Referral Priority:   Routine    Referral Type:   Surgical    Referral Reason:   Specialty Services Required    Requested Specialty:   Neurosurgery    Number of Visits Requested:   1  . Blood Thinner Instructions to Nursing    If the patient requires a Lovenox-bridge therapy, make sure arrangements are made to institute it with the assistance of the PCP.    Standing Status:   Standing    Number of Occurrences:   36    Standing Expiration Date:   03/10/2021    Scheduling Instructions:     Always stop the Coumadin (Warfarin) X 5 days prior to procedure or surgery.   Follow-up plan:   Return in about 6 months (around 09/03/2020) for (F2F), (MM), in addition, Procedure (w/ sedation): (L) CESI #1, (ASAP), (Blood-thinner Protocol).      Interventional management options: Ms. Guiffre was informed that there is no guarantee that she would be a candidate for interventional therapies.  The decision will be based on the results of diagnostic studies, as well as Ms. Bach's risk profile.  Procedure(s) under consideration:  Diagnostic/therapeutic left CESI #1  Diagnostic bilateral injections in her feet   Diagnostic bilateral lumbar facet blocks  Diagnostic right caudal ESI + diagnostic epidurogram  Diagnostic right lesser occipital nerve blocks  Diagnostic right cervical facet blocks  Diagnostic left shoulder injection  Diagnostic left suprascapular nerve block  Diagnostic left IA knee injection  Diagnostic left genicular nerve blocks     Recent Visits No visits were found meeting these conditions.  Showing recent visits within past 90 days and meeting all other requirements   Today's Visits Date Type Provider Dept  03/10/20 Office Visit Milinda Pointer, MD [facility]  Showing today's visits and meeting all other requirements   Future Appointments No visits were found meeting these conditions.  Showing future appointments within next 90 days and meeting all other requirements   I discussed the assessment and treatment plan with the patient. The patient was provided an opportunity to ask questions and all were answered. The patient agreed with the plan and demonstrated an understanding of the instructions.  Patient advised to call back or seek an in-person evaluation if the symptoms or condition worsens.  Duration of encounter: 25 minutes.  Note by: Gaspar Cola, MD Date: 03/10/2020; Time: 8:41 AM

## 2020-03-10 ENCOUNTER — Other Ambulatory Visit: Payer: Self-pay

## 2020-03-10 ENCOUNTER — Ambulatory Visit: Payer: Medicare PPO | Attending: Pain Medicine | Admitting: Pain Medicine

## 2020-03-10 DIAGNOSIS — G608 Other hereditary and idiopathic neuropathies: Secondary | ICD-10-CM

## 2020-03-10 DIAGNOSIS — M79672 Pain in left foot: Secondary | ICD-10-CM

## 2020-03-10 DIAGNOSIS — Z7901 Long term (current) use of anticoagulants: Secondary | ICD-10-CM

## 2020-03-10 DIAGNOSIS — R937 Abnormal findings on diagnostic imaging of other parts of musculoskeletal system: Secondary | ICD-10-CM

## 2020-03-10 DIAGNOSIS — M792 Neuralgia and neuritis, unspecified: Secondary | ICD-10-CM

## 2020-03-10 DIAGNOSIS — M25512 Pain in left shoulder: Secondary | ICD-10-CM

## 2020-03-10 DIAGNOSIS — M4802 Spinal stenosis, cervical region: Secondary | ICD-10-CM

## 2020-03-10 DIAGNOSIS — G894 Chronic pain syndrome: Secondary | ICD-10-CM

## 2020-03-10 DIAGNOSIS — M4310 Spondylolisthesis, site unspecified: Secondary | ICD-10-CM

## 2020-03-10 DIAGNOSIS — M5412 Radiculopathy, cervical region: Secondary | ICD-10-CM

## 2020-03-10 DIAGNOSIS — G8929 Other chronic pain: Secondary | ICD-10-CM

## 2020-03-10 DIAGNOSIS — M542 Cervicalgia: Secondary | ICD-10-CM

## 2020-03-10 DIAGNOSIS — M5030 Other cervical disc degeneration, unspecified cervical region: Secondary | ICD-10-CM

## 2020-03-10 DIAGNOSIS — M79671 Pain in right foot: Secondary | ICD-10-CM

## 2020-03-10 DIAGNOSIS — G629 Polyneuropathy, unspecified: Secondary | ICD-10-CM

## 2020-03-10 MED ORDER — GABAPENTIN 800 MG PO TABS: 800.0000 mg | ORAL_TABLET | Freq: Four times a day (QID) | ORAL | 1 refills | Status: DC

## 2020-03-10 NOTE — Patient Instructions (Addendum)
____________________________________________________________________________________________  Blood Thinners  IMPORTANT NOTICE:  If you take any of these, make sure to notify the nursing staff.  Failure to do so may result in injury.  Recommended time intervals to stop and restart blood-thinners, before & after invasive procedures  Generic Name Brand Name Stop Time. Must be stopped at least this long before procedures. After procedures, wait at least this long before re-starting.  Abciximab Reopro 15 days 2 hrs  Alteplase Activase 10 days 10 days  Anagrelide Agrylin    Apixaban Eliquis 3 days 6 hrs  Cilostazol Pletal 3 days 5 hrs  Clopidogrel Plavix 7-10 days 2 hrs  Dabigatran Pradaxa 5 days 6 hrs  Dalteparin Fragmin 24 hours 4 hrs  Dipyridamole Aggrenox 11days 2 hrs  Edoxaban Lixiana; Savaysa 3 days 2 hrs  Enoxaparin  Lovenox 24 hours 4 hrs  Eptifibatide Integrillin 8 hours 2 hrs  Fondaparinux  Arixtra 72 hours 12 hrs  Prasugrel Effient 7-10 days 6 hrs  Reteplase Retavase 10 days 10 days  Rivaroxaban Xarelto 3 days 6 hrs  Ticagrelor Brilinta 5-7 days 6 hrs  Ticlopidine Ticlid 10-14 days 2 hrs  Tinzaparin Innohep 24 hours 4 hrs  Tirofiban Aggrastat 8 hours 2 hrs  Warfarin Coumadin 5 days 2 hrs   Other medications with blood-thinning effects  Product indications Generic (Brand) names Note  Cholesterol Lipitor Stop 4 days before procedure  Blood thinner (injectable) Heparin (LMW or LMWH Heparin) Stop 24 hours before procedure  Cancer Ibrutinib (Imbruvica) Stop 7 days before procedure  Malaria/Rheumatoid Hydroxychloroquine (Plaquenil) Stop 11 days before procedure  Thrombolytics  10 days before or after procedures   Over-the-counter (OTC) Products with blood-thinning effects  Product Common names Stop Time  Aspirin > 325 mg Goody Powders, Excedrin, etc. 11 days  Aspirin ? 81 mg  7 days  Fish oil  4 days  Garlic supplements  7 days  Ginkgo biloba  36 hours  Ginseng  24  hours  NSAIDs Ibuprofen, Naprosyn, etc. 3 days  Vitamin E  4 days   ____________________________________________________________________________________________  ____________________________________________________________________________________________  Preparing for Procedure with Sedation  Procedure appointments are limited to planned procedures: . No Prescription Refills. . No disability issues will be discussed. . No medication changes will be discussed.  Instructions: . Oral Intake: Do not eat or drink anything for at least 8 hours prior to your procedure. (Exception: Blood Pressure Medication. See below.) . Transportation: Unless otherwise stated by your physician, you may drive yourself after the procedure. . Blood Pressure Medicine: Do not forget to take your blood pressure medicine with a sip of water the morning of the procedure. If your Diastolic (lower reading)is above 100 mmHg, elective cases will be cancelled/rescheduled. . Blood thinners: These will need to be stopped for procedures. Notify our staff if you are taking any blood thinners. Depending on which one you take, there will be specific instructions on how and when to stop it. . Diabetics on insulin: Notify the staff so that you can be scheduled 1st case in the morning. If your diabetes requires high dose insulin, take only  of your normal insulin dose the morning of the procedure and notify the staff that you have done so. . Preventing infections: Shower with an antibacterial soap the morning of your procedure. . Build-up your immune system: Take 1000 mg of Vitamin C with every meal (3 times a day) the day prior to your procedure. . Antibiotics: Inform the staff if you have a condition or reason that requires   you to take antibiotics before dental procedures. . Pregnancy: If you are pregnant, call and cancel the procedure. . Sickness: If you have a cold, fever, or any active infections, call and cancel the  procedure. . Arrival: You must be in the facility at least 30 minutes prior to your scheduled procedure. . Children: Do not bring children with you. . Dress appropriately: Bring dark clothing that you would not mind if they get stained. . Valuables: Do not bring any jewelry or valuables.  Reasons to call and reschedule or cancel your procedure: (Following these recommendations will minimize the risk of a serious complication.) . Surgeries: Avoid having procedures within 2 weeks of any surgery. (Avoid for 2 weeks before or after any surgery). . Flu Shots: Avoid having procedures within 2 weeks of a flu shots or . (Avoid for 2 weeks before or after immunizations). . Barium: Avoid having a procedure within 7-10 days after having had a radiological study involving the use of radiological contrast. (Myelograms, Barium swallow or enema study). . Heart attacks: Avoid any elective procedures or surgeries for the initial 6 months after a "Myocardial Infarction" (Heart Attack). . Blood thinners: It is imperative that you stop these medications before procedures. Let us know if you if you take any blood thinner.  . Infection: Avoid procedures during or within two weeks of an infection (including chest colds or gastrointestinal problems). Symptoms associated with infections include: Localized redness, fever, chills, night sweats or profuse sweating, burning sensation when voiding, cough, congestion, stuffiness, runny nose, sore throat, diarrhea, nausea, vomiting, cold or Flu symptoms, recent or current infections. It is specially important if the infection is over the area that we intend to treat. . Heart and lung problems: Symptoms that may suggest an active cardiopulmonary problem include: cough, chest pain, breathing difficulties or shortness of breath, dizziness, ankle swelling, uncontrolled high or unusually low blood pressure, and/or palpitations. If you are experiencing any of these symptoms, cancel your  procedure and contact your primary care physician for an evaluation.  Remember:  Regular Business hours are:  Monday to Thursday 8:00 AM to 4:00 PM  Provider's Schedule: Sarah Sheaffer, MD:  Procedure days: Tuesday and Thursday 7:30 AM to 4:00 PM  Sarah Lateef, MD:  Procedure days: Monday and Wednesday 7:30 AM to 4:00 PM ____________________________________________________________________________________________    

## 2020-03-11 ENCOUNTER — Telehealth: Payer: Self-pay

## 2020-03-11 NOTE — Telephone Encounter (Signed)
I called the patient to schedule her Sarah Riddle and she asked if she is supposed to stop her warfarin? Please advise if so and how long.

## 2020-03-11 NOTE — Telephone Encounter (Signed)
Spoke with patient's husband, instructed to stop Warfariin 5 days before procedure.

## 2020-03-17 ENCOUNTER — Telehealth: Payer: Self-pay | Admitting: *Deleted

## 2020-03-17 NOTE — Telephone Encounter (Signed)
Patient notified that we have received clearance from Dr. Humphrey Rolls to stop Coumadin for 5 days. She states she has already stopped taking it.

## 2020-03-18 ENCOUNTER — Ambulatory Visit
Admission: RE | Admit: 2020-03-18 | Discharge: 2020-03-18 | Disposition: A | Discharge status: Home / Self Care | Payer: Medicare PPO | Source: Ambulatory Visit | Attending: Internal Medicine | Admitting: Internal Medicine

## 2020-03-18 DIAGNOSIS — Z1231 Encounter for screening mammogram for malignant neoplasm of breast: Secondary | ICD-10-CM | POA: Diagnosis present

## 2020-03-19 ENCOUNTER — Other Ambulatory Visit: Payer: Self-pay | Admitting: Internal Medicine

## 2020-03-20 ENCOUNTER — Ambulatory Visit (HOSPITAL_BASED_OUTPATIENT_CLINIC_OR_DEPARTMENT_OTHER): Payer: Medicare PPO | Admitting: Pain Medicine

## 2020-03-20 ENCOUNTER — Other Ambulatory Visit: Payer: Self-pay

## 2020-03-20 ENCOUNTER — Ambulatory Visit
Admission: RE | Admit: 2020-03-20 | Discharge: 2020-03-20 | Disposition: A | Discharge status: Home / Self Care | Payer: Medicare PPO | Source: Ambulatory Visit | Attending: Pain Medicine | Admitting: Pain Medicine

## 2020-03-20 ENCOUNTER — Encounter: Payer: Self-pay | Admitting: Pain Medicine

## 2020-03-20 VITALS — BP 95/72 | HR 74 | Temp 97.0°F | Resp 15 | Ht 70.0 in | Wt 230.0 lb

## 2020-03-20 DIAGNOSIS — M5412 Radiculopathy, cervical region: Secondary | ICD-10-CM

## 2020-03-20 DIAGNOSIS — M25512 Pain in left shoulder: Secondary | ICD-10-CM

## 2020-03-20 DIAGNOSIS — M4802 Spinal stenosis, cervical region: Secondary | ICD-10-CM | POA: Diagnosis present

## 2020-03-20 DIAGNOSIS — R519 Headache, unspecified: Secondary | ICD-10-CM | POA: Diagnosis present

## 2020-03-20 DIAGNOSIS — Z7901 Long term (current) use of anticoagulants: Secondary | ICD-10-CM | POA: Diagnosis present

## 2020-03-20 DIAGNOSIS — Z91041 Radiographic dye allergy status: Secondary | ICD-10-CM | POA: Insufficient documentation

## 2020-03-20 DIAGNOSIS — G8929 Other chronic pain: Secondary | ICD-10-CM

## 2020-03-20 DIAGNOSIS — M47812 Spondylosis without myelopathy or radiculopathy, cervical region: Secondary | ICD-10-CM

## 2020-03-20 DIAGNOSIS — G4486 Cervicogenic headache: Secondary | ICD-10-CM

## 2020-03-20 DIAGNOSIS — M542 Cervicalgia: Secondary | ICD-10-CM | POA: Diagnosis not present

## 2020-03-20 DIAGNOSIS — M4310 Spondylolisthesis, site unspecified: Secondary | ICD-10-CM | POA: Diagnosis present

## 2020-03-20 DIAGNOSIS — M5030 Other cervical disc degeneration, unspecified cervical region: Secondary | ICD-10-CM | POA: Insufficient documentation

## 2020-03-20 MED ORDER — SODIUM CHLORIDE 0.9% FLUSH
1.0000 mL | Freq: Once | INTRAVENOUS | Status: AC
  Administered 2020-03-20: 1 mL

## 2020-03-20 MED ORDER — LACTATED RINGERS IV SOLN: 1000.0000 mL | Freq: Once | INTRAVENOUS | Status: DC

## 2020-03-20 MED ORDER — ROPIVACAINE HCL 2 MG/ML IJ SOLN
INTRAMUSCULAR | Status: AC
  Filled 2020-03-20: qty 10

## 2020-03-20 MED ORDER — LIDOCAINE HCL 2 % IJ SOLN
20.0000 mL | Freq: Once | INTRAMUSCULAR | Status: AC
  Administered 2020-03-20: 400 mg

## 2020-03-20 MED ORDER — MIDAZOLAM HCL 5 MG/5ML IJ SOLN: 1.0000 mg | INTRAMUSCULAR | Status: DC | PRN

## 2020-03-20 MED ORDER — SODIUM CHLORIDE (PF) 0.9 % IJ SOLN
INTRAMUSCULAR | Status: AC
  Filled 2020-03-20: qty 10

## 2020-03-20 MED ORDER — LIDOCAINE HCL 2 % IJ SOLN
INTRAMUSCULAR | Status: AC
  Filled 2020-03-20: qty 20

## 2020-03-20 MED ORDER — FENTANYL CITRATE (PF) 100 MCG/2ML IJ SOLN: 25.0000 ug | INTRAMUSCULAR | Status: DC | PRN

## 2020-03-20 MED ORDER — DEXAMETHASONE SODIUM PHOSPHATE 10 MG/ML IJ SOLN
INTRAMUSCULAR | Status: AC
  Filled 2020-03-20: qty 1

## 2020-03-20 MED ORDER — ROPIVACAINE HCL 2 MG/ML IJ SOLN
1.0000 mL | Freq: Once | INTRAMUSCULAR | Status: AC
  Administered 2020-03-20: 1 mL via EPIDURAL

## 2020-03-20 MED ORDER — DEXAMETHASONE SODIUM PHOSPHATE 10 MG/ML IJ SOLN
10.0000 mg | Freq: Once | INTRAMUSCULAR | Status: AC
  Administered 2020-03-20: 10 mg

## 2020-03-20 NOTE — Patient Instructions (Signed)

## 2020-03-20 NOTE — Progress Notes (Signed)
Safety precautions to be maintained throughout the outpatient stay will include: orient to surroundings, keep bed in low position, maintain call bell within reach at all times, provide assistance with transfer out of bed and ambulation.  

## 2020-03-20 NOTE — Progress Notes (Signed)
PROVIDER NOTE: Information contained herein reflects review and annotations entered in association with encounter. Interpretation of such information and data should be left to medically-trained personnel. Information provided to patient can be located elsewhere in the medical record under "Patient Instructions". Document created using STT-dictation technology, any transcriptional errors that may result from process are unintentional.    Patient: Sarah Riddle  Service Category: Procedure  Provider: Gaspar Cola, MD  DOB: [birth year]  DOS: 03/20/2020  Location: [facility]  MRN: [id number]  Setting: Ambulatory - outpatient  Referring Provider: Milinda Pointer, MD  Type: Established Patient  Specialty: Interventional Pain Management  PCP: Perrin Maltese, MD   Primary Reason for Visit: Interventional Pain Management Treatment. CC: Neck Pain (left)  Procedure:          Anesthesia, Analgesia, Anxiolysis:  Type: Diagnostic, Inter-Laminar, Cervical Epidural Steroid Injection  #1  Region: Posterior Cervico-thoracic Region Level: C7-T1 Laterality: Left-Sided Paramedial  Type: Local Anesthesia Indication(s): Analgesia         Route: Intravenous (IV) IV Access: Declined Sedation: Declined  Local Anesthetic: Lidocaine 1-2%  Position: Prone with head of the table was raised to facilitate breathing.   Indications: 1. Cervicalgia (Right)   2. DDD (degenerative disc disease), cervical   3. Cervical grade 1 Anterolisthesis of C7/T1   4. Cervical grade 1 Retrolisthesis of C4/C5   5. Cervical foraminal stenosis (Bilateral) (C3-4, C4-5, C5-6)   6. Cervicogenic headache (Tertiary Area of Pain) (Right)   7. Cervical facet hypertrophy   8. Radicular pain of shoulder (Left)    Chronic anticoagulation (Coumadin)    Iodinated Diagnostic Agent Allergy (Contrast Dye)   Pain Score: Pre-procedure: 6 /10 Post-procedure: 0-No pain/10   Pre-op Assessment:  Sarah Riddle is a 79 y.o.  (year old), female patient, seen today for interventional treatment. She  has a past surgical history that includes Cardiac electrophysiology mapping and ablation; Back surgery; Abdominal hysterectomy; Total hip arthroplasty (Left); Eye surgery; and Breast cyst aspiration (Left, 2001). Sarah Riddle has a current medication list which includes the following prescription(s): acetaminophen, alendronate, allopurinol, amitriptyline, atorvastatin, cranberry, cyanocobalamin, epinephrine, ergocalciferol, ezetimibe, fluticasone, furosemide, gabapentin, isosorbide mononitrate, levocetirizine, losartan, metoprolol succinate, nitroglycerin, OVER THE COUNTER MEDICATION, pantoprazole, rosuvastatin, sertraline, warfarin, and pregabalin. Her primarily concern today is the Neck Pain (left)  Initial Vital Signs:  Pulse/HCG Rate: 63  Temp: (!) 97 F (36.1 C) Resp: 16 BP: 127/64 SpO2: 97 %  BMI: Estimated body mass index is 33 kg/m as calculated from the following:   Height as of this encounter: 5\' 10"  (1.778 m).   Weight as of this encounter: 230 lb (104.3 kg).  Risk Assessment: Allergies: Reviewed. She is allergic to iodinated diagnostic agents, ibuprofen, lubiprostone, sulfa antibiotics, zolpidem, boniva [ibandronic acid], celecoxib, duloxetine, and tetracyclines & related.  Allergy Precautions: None required Coagulopathies: Reviewed. None identified.  Blood-thinner therapy: None at this time Active Infection(s): Reviewed. None identified. Sarah Riddle is afebrile  Site Confirmation: Sarah Riddle was asked to confirm the procedure and laterality before marking the site Procedure checklist: Completed Consent: Before the procedure and under the influence of no sedative(s), amnesic(s), or anxiolytics, the patient was informed of the treatment options, risks and possible complications. To fulfill our ethical and legal obligations, as recommended by the American Medical Association's Code of Ethics, I have informed the  patient of my clinical impression; the nature and purpose of the treatment or procedure; the risks, benefits, and possible complications of the intervention; the alternatives, including doing nothing;  the risk(s) and benefit(s) of the alternative treatment(s) or procedure(s); and the risk(s) and benefit(s) of doing nothing. The patient was provided information about the general risks and possible complications associated with the procedure. These may include, but are not limited to: failure to achieve desired goals, infection, bleeding, organ or nerve damage, allergic reactions, paralysis, and death. In addition, the patient was informed of those risks and complications associated to Spine-related procedures, such as failure to decrease pain; infection (i.e.: Meningitis, epidural or intraspinal abscess); bleeding (i.e.: epidural hematoma, subarachnoid hemorrhage, or any other type of intraspinal or peri-dural bleeding); organ or nerve damage (i.e.: Any type of peripheral nerve, nerve root, or spinal cord injury) with subsequent damage to sensory, motor, and/or autonomic systems, resulting in permanent pain, numbness, and/or weakness of one or several areas of the body; allergic reactions; (i.e.: anaphylactic reaction); and/or death. Furthermore, the patient was informed of those risks and complications associated with the medications. These include, but are not limited to: allergic reactions (i.e.: anaphylactic or anaphylactoid reaction(s)); adrenal axis suppression; blood sugar elevation that in diabetics may result in ketoacidosis or comma; water retention that in patients with history of congestive heart failure may result in shortness of breath, pulmonary edema, and decompensation with resultant heart failure; weight gain; swelling or edema; medication-induced neural toxicity; particulate matter embolism and blood vessel occlusion with resultant organ, and/or nervous system infarction; and/or aseptic necrosis  of one or more joints. Finally, the patient was informed that Medicine is not an exact science; therefore, there is also the possibility of unforeseen or unpredictable risks and/or possible complications that may result in a catastrophic outcome. The patient indicated having understood very clearly. We have given the patient no guarantees and we have made no promises. Enough time was given to the patient to ask questions, all of which were answered to the patient's satisfaction. Sarah Riddle has indicated that she wanted to continue with the procedure. Attestation: I, the ordering provider, attest that I have discussed with the patient the benefits, risks, side-effects, alternatives, likelihood of achieving goals, and potential problems during recovery for the procedure that I have provided informed consent. Date  Time: 03/20/2020  8:03 AM  Pre-Procedure Preparation:  Monitoring: As per clinic protocol. Respiration, ETCO2, SpO2, BP, heart rate and rhythm monitor placed and checked for adequate function Safety Precautions: Patient was assessed for positional comfort and pressure points before starting the procedure. Time-out: I initiated and conducted the "Time-out" before starting the procedure, as per protocol. The patient was asked to participate by confirming the accuracy of the "Time Out" information. Verification of the correct person, site, and procedure were performed and confirmed by me, the nursing staff, and the patient. "Time-out" conducted as per Joint Commission's Universal Protocol (UP.01.01.01). Time: 9381  Description of Procedure:          Target Area: For Epidural Steroid injections the target is the interlaminar space, initially targeting the lower border of the superior vertebral body lamina. Approach: Paramedial approach. Area Prepped: Entire PosteriorCervical Region DuraPrep (Iodine Povacrylex [0.7% available iodine] and Isopropyl Alcohol, 74% w/w) Safety Precautions: Aspiration  looking for blood return was conducted prior to all injections. At no point did we inject any substances, as a needle was being advanced. No attempts were made at seeking any paresthesias. Safe injection practices and needle disposal techniques used. Medications properly checked for expiration dates. SDV (single dose vial) medications used. Description of the Procedure: Protocol guidelines were followed. The procedure needle was introduced through the skin,  ipsilateral to the reported pain, and advanced to the target area. Bone was contacted and the needle walked caudad, until the lamina was cleared. The epidural space was identified using "loss-of-resistance technique" with 2-3 ml of PF-NaCl (0.9% NSS), in a 5cc LOR glass syringe. Vitals:   03/20/20 0802 03/20/20 0840 03/20/20 0850 03/20/20 0900  BP: 127/64 129/79 94/77 95/72   Pulse: 63 88 75 74  Resp: 16 18 16 15   Temp: (!) 97 F (36.1 C)     TempSrc: Temporal     SpO2: 97% 98% 99% 98%  Weight: 230 lb (104.3 kg)     Height: 5\' 10"  (1.778 m)       Start Time: 0850 hrs. End Time: 0855 hrs. Materials:  Needle(s) Type: Epidural needle Gauge: 17G Length: 3.5-in Medication(s): Please see orders for medications and dosing details.  Imaging Guidance (Spinal):          Type of Imaging Technique: Fluoroscopy Guidance (Spinal) Indication(s): Assistance in needle guidance and placement for procedures requiring needle placement in or near specific anatomical locations not easily accessible without such assistance. Exposure Time: Please see nurses notes. Contrast: Before injecting any contrast, we confirmed that the patient did not have an allergy to iodine, shellfish, or radiological contrast. Once satisfactory needle placement was completed at the desired level, radiological contrast was injected. Contrast injected under live fluoroscopy. No contrast complications. See chart for type and volume of contrast used. Fluoroscopic Guidance: I was  personally present during the use of fluoroscopy. "Tunnel Vision Technique" used to obtain the best possible view of the target area. Parallax error corrected before commencing the procedure. "Direction-depth-direction" technique used to introduce the needle under continuous pulsed fluoroscopy. Once target was reached, antero-posterior, oblique, and lateral fluoroscopic projection used confirm needle placement in all planes. Images permanently stored in EMR. Interpretation: I personally interpreted the imaging intraoperatively. Adequate needle placement confirmed in multiple planes. Appropriate spread of contrast into desired area was observed. No evidence of afferent or efferent intravascular uptake. No intrathecal or subarachnoid spread observed. Permanent images saved into the patient's record.  Antibiotic Prophylaxis:   Anti-infectives (From admission, onward)   None     Indication(s): None identified  Post-operative Assessment:  Post-procedure Vital Signs:  Pulse/HCG Rate: 74  Temp: (!) 97 F (36.1 C) Resp: 15 BP: 95/72 SpO2: 98 %  EBL: None  Complications: No immediate post-treatment complications observed by team, or reported by patient.  Note: The patient tolerated the entire procedure well. A repeat set of vitals were taken after the procedure and the patient was kept under observation following institutional policy, for this type of procedure. Post-procedural neurological assessment was performed, showing return to baseline, prior to discharge. The patient was provided with post-procedure discharge instructions, including a section on how to identify potential problems. Should any problems arise concerning this procedure, the patient was given instructions to immediately contact us, at any time, without hesitation. In any case, we plan to contact the patient by telephone for a follow-up status report regarding this interventional procedure.  Comments:  No additional relevant  information.  Plan of Care  Orders:  Orders Placed This Encounter  Procedures  . Cervical Epidural Injection    Procedure: Cervical Epidural Steroid Injection/Block Purpose: Diagnostic Indication(s): Radiculitis and cervicalgia associater with cervical degenerative disc disease.    Scheduling Instructions:     Level(s): C7-T1     Laterality: Left-sided     Sedation: Patient's choice.     Timeframe: Today    Order  Specific Question:   Where will this procedure be performed?    Answer:   [facility]    Comments:   by Dr. Dossie Arbour  . [facility] C-ARM 1-60 MIN NO REPORT    Intraoperative interpretation by procedural physician at [place].    Standing Status:   Standing    Number of Occurrences:   1    Order Specific Question:   Reason for exam:    Answer:   Assistance in needle guidance and placement for procedures requiring needle placement in or near specific anatomical locations not easily accessible without such assistance.  . Informed Consent Details: Physician/Practitioner Attestation; Transcribe to consent form and obtain patient signature    Nursing Order: Transcribe to consent form and obtain patient signature. Note: Always confirm laterality of pain with Sarah Riddle, before procedure.  Procedure: Cervical Epidural Steroid Injection (CESI) under fluoroscopic guidance  Indication/Reason: Cervicalgia (Neck Pain) with or without Cervical Radiculopathy/Radiculitis (Arm/Shoulder Pain, Numbness, and/or weakness), secondary to Cervical and/or Cervicothoracic Degenerative Disc Disease (DDD), with or without Intervertebral Disc Displacement (IVDD).  Provider Attestation: I, Lyman Dossie Arbour, MD, (Pain Management Specialist), the physician/practitioner, attest that I have discussed with the patient the benefits, risks, side effects, alternatives, likelihood of achieving goals and potential problems during recovery for the procedure that I have provided informed  consent.  . Care order/instruction: Please confirm that the patient has stopped the Coumadin (Warfarin) X 5 days prior to procedure or surgery.    Please confirm that the patient has stopped the Coumadin (Warfarin) X 5 days prior to procedure or surgery.    Standing Status:   Standing    Number of Occurrences:   1  . Provide equipment / supplies at bedside    Equipment required: Single use, disposable, "Epidural Tray" Epidural Catheter: NOT required    Standing Status:   Standing    Number of Occurrences:   1    Order Specific Question:   Specify    Answer:   Epidural Tray  . Bleeding precautions    Standing Status:   Standing    Number of Occurrences:   1   Chronic Opioid Analgesic:  No opioid analgesics prescribed by our practice.   Medications ordered for procedure: Meds ordered this encounter  Medications  . lidocaine (XYLOCAINE) 2 % (with pres) injection 400 mg  . DISCONTD: lactated ringers infusion 1,000 mL  . DISCONTD: midazolam (VERSED) 5 MG/5ML injection 1-2 mg    Make sure Flumazenil is available in the pyxis when using this medication. If oversedation occurs, administer 0.2 mg IV over 15 sec. If after 45 sec no response, administer 0.2 mg again over 1 min; may repeat at 1 min intervals; not to exceed 4 doses (1 mg)  . DISCONTD: fentaNYL (SUBLIMAZE) injection 25-50 mcg    Make sure Narcan is available in the pyxis when using this medication. In the event of respiratory depression (RR< 8/min): Titrate NARCAN (naloxone) in increments of 0.1 to 0.2 mg IV at 2-3 minute intervals, until desired degree of reversal.  . sodium chloride flush (NS) 0.9 % injection 1 mL  . ropivacaine (PF) 2 mg/mL (0.2%) (NAROPIN) injection 1 mL  . dexamethasone (DECADRON) injection 10 mg   Medications administered: We administered lidocaine, sodium chloride flush, ropivacaine (PF) 2 mg/mL (0.2%), and dexamethasone.  See the medical record for exact dosing, route, and time of  administration.  Follow-up plan:   Return in about 2 weeks (around 04/03/2020) for (VV), (  PP).       Interventional management options: Sarah Riddle was informed that there is no guarantee that she would be a candidate for interventional therapies. The decision will be based on the results of diagnostic studies, as well as Sarah Riddle's risk profile.  Procedure(s) under consideration:  Diagnostic/therapeutic left CESI #1  Diagnostic bilateral injections in her feet  Diagnostic bilateral lumbar facet blocks  Diagnostic right caudal ESI + diagnostic epidurogram  Diagnostic right lesser occipital nerve blocks  Diagnostic right cervical facet blocks  Diagnostic left shoulder injection  Diagnostic left suprascapular nerve block  Diagnostic left IA knee injection  Diagnostic left genicular nerve blocks      Recent Visits Date Type Provider Dept  03/10/20 Office Visit Milinda Pointer, MD [facility]  Showing recent visits within past 90 days and meeting all other requirements Today's Visits Date Type Provider Dept  03/20/20 Procedure visit Milinda Pointer, MD [facility]  Showing today's visits and meeting all other requirements Future Appointments Date Type Provider Dept  04/08/20 Appointment Milinda Pointer, MD [facility]  Showing future appointments within next 90 days and meeting all other requirements  Disposition: Discharge home  Discharge (Date  Time): 03/20/2020; 0904 hrs.   Primary Care Physician: Perrin Maltese, MD Location: [facility] Note by: Gaspar Cola, MD Date: 03/20/2020; Time: 5:18 PM  Disclaimer:  Medicine is not an Chief Strategy Officer. The only guarantee in medicine is that nothing is guaranteed. It is important to note that the decision to proceed with this intervention was based on the information collected from the patient. The Data and conclusions were drawn from the patient's questionnaire, the  interview, and the physical examination. Because the information was provided in large part by the patient, it cannot be guaranteed that it has not been purposely or unconsciously manipulated. Every effort has been made to obtain as much relevant data as possible for this evaluation. It is important to note that the conclusions that lead to this procedure are derived in large part from the available data. Always take into account that the treatment will also be dependent on availability of resources and existing treatment guidelines, considered by other Pain Management Practitioners as being common knowledge and practice, at the time of the intervention. For Medico-Legal purposes, it is also important to point out that variation in procedural techniques and pharmacological choices are the acceptable norm. The indications, contraindications, technique, and results of the above procedure should only be interpreted and judged by a Board-Certified Interventional Pain Specialist with extensive familiarity and expertise in the same exact procedure and technique.

## 2020-03-21 ENCOUNTER — Telehealth: Payer: Self-pay

## 2020-03-21 NOTE — Telephone Encounter (Signed)
Post procedure phone call.  Patient states she is doing well.  

## 2020-03-24 ENCOUNTER — Other Ambulatory Visit: Payer: Self-pay | Admitting: Internal Medicine

## 2020-03-24 DIAGNOSIS — N6489 Other specified disorders of breast: Secondary | ICD-10-CM

## 2020-03-24 DIAGNOSIS — R928 Other abnormal and inconclusive findings on diagnostic imaging of breast: Secondary | ICD-10-CM

## 2020-03-27 ENCOUNTER — Ambulatory Visit
Admission: RE | Admit: 2020-03-27 | Discharge: 2020-03-27 | Disposition: A | Discharge status: Home / Self Care | Payer: Medicare PPO | Source: Ambulatory Visit | Attending: Internal Medicine | Admitting: Internal Medicine

## 2020-03-27 DIAGNOSIS — R928 Other abnormal and inconclusive findings on diagnostic imaging of breast: Secondary | ICD-10-CM

## 2020-03-27 DIAGNOSIS — N6489 Other specified disorders of breast: Secondary | ICD-10-CM

## 2020-04-03 ENCOUNTER — Encounter: Payer: Self-pay | Admitting: Pain Medicine

## 2020-04-08 ENCOUNTER — Ambulatory Visit: Payer: Medicare PPO | Attending: Pain Medicine | Admitting: Pain Medicine

## 2020-04-08 ENCOUNTER — Other Ambulatory Visit: Payer: Self-pay

## 2020-04-08 DIAGNOSIS — M545 Low back pain, unspecified: Secondary | ICD-10-CM

## 2020-04-08 DIAGNOSIS — G894 Chronic pain syndrome: Secondary | ICD-10-CM | POA: Diagnosis not present

## 2020-04-08 DIAGNOSIS — M542 Cervicalgia: Secondary | ICD-10-CM

## 2020-04-08 DIAGNOSIS — G629 Polyneuropathy, unspecified: Secondary | ICD-10-CM

## 2020-04-08 DIAGNOSIS — M79671 Pain in right foot: Secondary | ICD-10-CM

## 2020-04-08 DIAGNOSIS — M25512 Pain in left shoulder: Secondary | ICD-10-CM

## 2020-04-08 DIAGNOSIS — M25562 Pain in left knee: Secondary | ICD-10-CM

## 2020-04-08 DIAGNOSIS — R519 Headache, unspecified: Secondary | ICD-10-CM

## 2020-04-08 DIAGNOSIS — M792 Neuralgia and neuritis, unspecified: Secondary | ICD-10-CM

## 2020-04-08 DIAGNOSIS — G4486 Cervicogenic headache: Secondary | ICD-10-CM

## 2020-04-08 DIAGNOSIS — G8929 Other chronic pain: Secondary | ICD-10-CM

## 2020-04-08 DIAGNOSIS — M79672 Pain in left foot: Secondary | ICD-10-CM

## 2020-04-08 DIAGNOSIS — G608 Other hereditary and idiopathic neuropathies: Secondary | ICD-10-CM

## 2020-04-08 MED ORDER — PREGABALIN 75 MG PO CAPS: 75.0000 mg | ORAL_CAPSULE | Freq: Three times a day (TID) | ORAL | 5 refills | Status: DC

## 2020-04-08 NOTE — Progress Notes (Signed)
Patient: Sarah Riddle  Service Category: E/M  Provider: Gaspar Cola, MD  DOB: [birth year]  DOS: 04/08/2020  Location: Office  MRN: [id number]  Setting: Ambulatory outpatient  Referring Provider: Perrin Maltese, MD  Type: Established Patient  Specialty: Interventional Pain Management  PCP: Perrin Maltese, MD  Location: Remote location  Delivery: TeleHealth     Virtual Encounter - Pain Management PROVIDER NOTE: Information contained herein reflects review and annotations entered in association with encounter. Interpretation of such information and data should be left to medically-trained personnel. Information provided to patient can be located elsewhere in the medical record under "Patient Instructions". Document created using STT-dictation technology, any transcriptional errors that may result from process are unintentional.    Contact & Pharmacy Preferred: [phone] Home: [phone] (home) Mobile: [phone] (mobile) E-mail: [email or web address]  [facility] - [place], [place] [address] Phone: [phone] Fax: [phone]   Pre-screening  Sarah Riddle offered "in-person" vs "virtual" encounter. She indicated preferring virtual for this encounter.   Reason COVID-19*  Social distancing based on CDC and AMA recommendations.   I contacted KENNDRA MORRIS on 04/08/2020 via telephone.      I clearly identified myself as Gaspar Cola, MD. I verified that I was speaking with the correct person using two identifiers (Name: Sarah Riddle, and date of birth: 02-Jun-1941).  Consent I sought verbal advanced consent from Sarah Riddle for virtual visit interactions. I informed Sarah Riddle of possible security and privacy concerns, risks, and limitations associated with providing "not-in-person" medical evaluation and management services. I also informed Sarah Riddle of the availability of "in-person"  appointments. Finally, I informed her that there would be a charge for the virtual visit and that she could be  personally, fully or partially, financially responsible for it. Ms. Benthall expressed understanding and agreed to proceed.   Historic Elements   Sarah Riddle is a 79 y.o. year old, female patient evaluated today after her last contact with our practice on 03/21/2020. Sarah Riddle  has a past medical history of A-fib ([place]) and Cancer ([place]). She also  has a past surgical history that includes Cardiac electrophysiology mapping and ablation; Back surgery; Abdominal hysterectomy; Total hip arthroplasty (Left); Eye surgery; and Breast cyst aspiration (Left, 2001). Sarah Riddle has a current medication list which includes the following prescription(s): acetaminophen, alendronate, allopurinol, amitriptyline, atorvastatin, cranberry, cyanocobalamin, epinephrine, ergocalciferol, ezetimibe, fluticasone, furosemide, gabapentin, isosorbide mononitrate, levocetirizine, losartan, metoprolol succinate, nitroglycerin, OVER THE COUNTER MEDICATION, pantoprazole, rosuvastatin, sertraline, and warfarin. She  reports that she has never smoked. She has never used smokeless tobacco. She reports that she does not drink alcohol and does not use drugs. Sarah Riddle is allergic to iodinated diagnostic agents, ibuprofen, lubiprostone, sulfa antibiotics, zolpidem, boniva [ibandronic acid], celecoxib, duloxetine, and tetracyclines & related.   HPI  Today, she is being contacted for a post-procedure assessment.  The patient indicates having done great after that cervical epidural steroid injection.  Right now she indicates that she is about 80% improved.  She did see Dr. Lacinda Axon [facility] neurosurgery) who suggested some physical therapy which seems to be helping her.  She had a couple more questions with regards to the epidurals and how often she can get those and at this point she does not seem to need another one for the  time being, but she was reminded that should her condition worsen, all she needs to do is give  [place] a call. She indicated that her primary care physician has offered to take over the gabapentin.  Today I told her that I would be sending a refill, but then I noticed that she does have enough to last until December.  Currently on patient will have her PCP take it off her medicines.  We will continue to see her PRN as needed.  Post-Procedure Evaluation  Procedure: Diagnostic left-sided cervical ESI #1 under fluoroscopic guidance, no sedation Pre-procedure pain level: 6/10 Post-procedure: 0/10 (100% relief)  Sedation: None.  Effectiveness during initial hour after procedure(Ultra-Short Term Relief): 100 %.  Local anesthetic used: Long-acting (4-6 hours) Effectiveness: Defined as any analgesic benefit obtained secondary to the administration of local anesthetics. This carries significant diagnostic value as to the etiological location, or anatomical origin, of the pain. Duration of benefit is expected to coincide with the duration of the local anesthetic used.  Effectiveness during initial 4-6 hours after procedure(Short-Term Relief): 70 %.  Long-term benefit: Defined as any relief past the pharmacologic duration of the local anesthetics.  Effectiveness past the initial 6 hours after procedure(Long-Term Relief): 75 %.  Current benefits: Defined as benefit that persist at this time.   Analgesia:  80% improvement Function: Sarah Riddle reports improvement in function ROM: Sarah Riddle reports improvement in ROM  Pharmacotherapy Assessment  Analgesic: No opioid analgesics prescribed by our practice.   Monitoring: [place] PMP: PDMP reviewed during this encounter.       Pharmacotherapy: No side-effects or adverse reactions reported. Compliance: No problems identified. Effectiveness: Clinically acceptable. Plan: Refer to "POC".  UDS: No results found for: SUMMARY  Laboratory Chemistry Profile    Renal Lab Results  Component Value Date   BUN 21 01/25/2019   CREATININE 0.83 01/25/2019   GFRAA >60 01/25/2019   GFRNONAA >60 01/25/2019     Hepatic Lab Results  Component Value Date   AST 18 01/25/2019   ALT 13 01/25/2019   ALBUMIN 3.9 01/25/2019   ALKPHOS 67 01/25/2019     Electrolytes Lab Results  Component Value Date   NA 142 01/25/2019   K 3.8 01/25/2019   CL 104 01/25/2019   CALCIUM 9.6 01/25/2019   MG 1.8 01/25/2019     Bone Lab Results  Component Value Date   25OHVITD1 68 01/25/2019   25OHVITD2 65 01/25/2019   25OHVITD3 2.9 01/25/2019     Inflammation (CRP: Acute Phase) (ESR: Chronic Phase) Lab Results  Component Value Date   CRP <0.8 01/25/2019   ESRSEDRATE 9 01/25/2019   LATICACIDVEN 0.9 08/25/2017       Note: Above Lab results reviewed.   Imaging  US BREAST LTD UNI RIGHT INC AXILLA CLINICAL DATA:  Possible asymmetry in the lateral aspect of the right breast anteriorly on a recent screening mammogram.  EXAM: DIGITAL DIAGNOSTIC RIGHT MAMMOGRAM WITH CAD AND TOMO  ULTRASOUND RIGHT BREAST  COMPARISON:  Previous exam(s).  ACR Breast Density Category b: There are scattered areas of fibroglandular density.  FINDINGS: 3D tomographic and 2D generated true lateral and spot compression craniocaudal views of the right breast confirm a small, oval, circumscribed mass in the anterior aspect of the upper-outer quadrant of the breast.  Mammographic images were processed with CAD.  Targeted ultrasound is performed, showing a 5 mm cyst with minimal internal echoes in the 10 o'clock position of the right breast, 2 cm from the nipple. No internal blood flow was demonstrated with power Doppler. This corresponds to the mammographic mass.  IMPRESSION: Benign right breast cyst.  No evidence of malignancy.  RECOMMENDATION: Bilateral screening mammogram in 1 year.  I have discussed the findings and recommendations with the patient. If applicable, a  reminder letter will be sent to the patient regarding the next appointment.  BI-RADS CATEGORY  2: Benign.  Electronically Signed   By: Claudie Revering M.D.   On: 03/27/2020 11:59 MM DIAG BREAST TOMO UNI RIGHT CLINICAL DATA:  Possible asymmetry in the lateral aspect of the right breast anteriorly on a recent screening mammogram.  EXAM: DIGITAL DIAGNOSTIC RIGHT MAMMOGRAM WITH CAD AND TOMO  ULTRASOUND RIGHT BREAST  COMPARISON:  Previous exam(s).  ACR Breast Density Category b: There are scattered areas of fibroglandular density.  FINDINGS: 3D tomographic and 2D generated true lateral and spot compression craniocaudal views of the right breast confirm a small, oval, circumscribed mass in the anterior aspect of the upper-outer quadrant of the breast.  Mammographic images were processed with CAD.  Targeted ultrasound is performed, showing a 5 mm cyst with minimal internal echoes in the 10 o'clock position of the right breast, 2 cm from the nipple. No internal blood flow was demonstrated with power Doppler. This corresponds to the mammographic mass.  IMPRESSION: Benign right breast cyst.  No evidence of malignancy.  RECOMMENDATION: Bilateral screening mammogram in 1 year.  I have discussed the findings and recommendations with the patient. If applicable, a reminder letter will be sent to the patient regarding the next appointment.  BI-RADS CATEGORY  2: Benign.  Electronically Signed   By: Claudie Revering M.D.   On: 03/27/2020 11:59  Assessment  The primary encounter diagnosis was Chronic pain syndrome. Diagnoses of Chronic feet pain (Primary area of Pain) (Bilateral) (L>R), Chronic low back pain (Secondary Area of Pain) (Bilateral) (R>L) without sciatica, Cervicogenic headache (Tertiary Area of Pain) (Right), Chronic neck pain (Fourth Area of Pain) (Right), Chronic shoulder pain (Fifth Area of Pain) (Left), Chronic knee pain (Sixth Area of Pain) (Left), Neuropathy, Sensory  peripheral neuropathy, and Neuropathic pain were also pertinent to this visit.  Plan of Care  Problem-specific:  No problem-specific Assessment & Plan notes found for this encounter.  Sarah Riddle has a current medication list which includes the following long-term medication(s): allopurinol, amitriptyline, atorvastatin, ezetimibe, fluticasone, furosemide, gabapentin, isosorbide mononitrate, levocetirizine, losartan, metoprolol succinate, nitroglycerin, pantoprazole, rosuvastatin, sertraline, and warfarin.  Pharmacotherapy (Medications Ordered): Meds ordered this encounter  Medications  . DISCONTD: pregabalin (LYRICA) 75 MG capsule    Sig: Take 1 capsule (75 mg total) by mouth 3 (three) times daily. Must last 30 days    Dispense:  90 capsule    Refill:  5    Fill one day early if pharmacy is closed on scheduled refill date. May substitute for generic if available.   Orders:  No orders of the defined types were placed in this encounter.  Follow-up plan:   Return if symptoms worsen or fail to improve.      Interventional treatment options: Planned, scheduled, and/or pending:   None at this time   Under consideration:   Diagnostic bilateral injections in her feet  Diagnostic bilateral lumbar facet blocks  Diagnostic right caudal ESI + diagnostic epidurogram  Diagnostic right lesser occipital nerve blocks  Diagnostic right cervical facet blocks  Diagnostic left shoulder injection  Diagnostic left suprascapular nerve block  Diagnostic left IA knee injection  Diagnostic left genicular nerve blocks    Therapeutic/palliative (PRN):   Therapeutic left CESI #2     Recent Visits Date Type Provider Dept  03/20/20 Procedure visit Milinda Pointer, MD [facility]  03/10/20 Office Visit Milinda Pointer, MD [facility]  Showing recent visits within past 90 days and meeting all other requirements Today's Visits Date Type Provider Dept  04/08/20  Telemedicine Milinda Pointer, MD [facility]  Showing today's visits and meeting all other requirements Future Appointments No visits were found meeting these conditions. Showing future appointments within next 90 days and meeting all other requirements  I discussed the assessment and treatment plan with the patient. The patient was provided an opportunity to ask questions and all were answered. The patient agreed with the plan and demonstrated an understanding of the instructions.  Patient advised to call back or seek an in-person evaluation if the symptoms or condition worsens.  Duration of encounter: 15 minutes.  Note by: Gaspar Cola, MD Date: 04/08/2020; Time: 4:13 PM

## 2020-08-08 ENCOUNTER — Other Ambulatory Visit: Payer: Self-pay | Admitting: Pain Medicine

## 2020-08-08 DIAGNOSIS — M792 Neuralgia and neuritis, unspecified: Secondary | ICD-10-CM

## 2020-09-03 ENCOUNTER — Encounter: Payer: Medicare PPO | Admitting: Pain Medicine

## 2021-03-10 DIAGNOSIS — J301 Allergic rhinitis due to pollen: Secondary | ICD-10-CM | POA: Diagnosis not present

## 2021-03-10 DIAGNOSIS — J3089 Other allergic rhinitis: Secondary | ICD-10-CM | POA: Diagnosis not present

## 2021-03-10 DIAGNOSIS — J3081 Allergic rhinitis due to animal (cat) (dog) hair and dander: Secondary | ICD-10-CM | POA: Diagnosis not present

## 2021-03-13 DIAGNOSIS — I340 Nonrheumatic mitral (valve) insufficiency: Secondary | ICD-10-CM | POA: Diagnosis not present

## 2021-03-13 DIAGNOSIS — R0602 Shortness of breath: Secondary | ICD-10-CM | POA: Diagnosis not present

## 2021-03-13 DIAGNOSIS — E782 Mixed hyperlipidemia: Secondary | ICD-10-CM | POA: Diagnosis not present

## 2021-03-13 DIAGNOSIS — I2510 Atherosclerotic heart disease of native coronary artery without angina pectoris: Secondary | ICD-10-CM | POA: Diagnosis not present

## 2021-03-13 DIAGNOSIS — I10 Essential (primary) hypertension: Secondary | ICD-10-CM | POA: Diagnosis not present

## 2021-03-13 DIAGNOSIS — I4891 Unspecified atrial fibrillation: Secondary | ICD-10-CM | POA: Diagnosis not present

## 2021-03-19 DIAGNOSIS — J3081 Allergic rhinitis due to animal (cat) (dog) hair and dander: Secondary | ICD-10-CM | POA: Diagnosis not present

## 2021-03-19 DIAGNOSIS — J301 Allergic rhinitis due to pollen: Secondary | ICD-10-CM | POA: Diagnosis not present

## 2021-03-19 DIAGNOSIS — J3089 Other allergic rhinitis: Secondary | ICD-10-CM | POA: Diagnosis not present

## 2021-03-19 DIAGNOSIS — R0600 Dyspnea, unspecified: Secondary | ICD-10-CM | POA: Diagnosis not present

## 2021-03-21 DIAGNOSIS — G4730 Sleep apnea, unspecified: Secondary | ICD-10-CM | POA: Diagnosis not present

## 2021-03-24 DIAGNOSIS — C44629 Squamous cell carcinoma of skin of left upper limb, including shoulder: Secondary | ICD-10-CM | POA: Diagnosis not present

## 2021-03-24 DIAGNOSIS — D225 Melanocytic nevi of trunk: Secondary | ICD-10-CM | POA: Diagnosis not present

## 2021-03-24 DIAGNOSIS — Z85820 Personal history of malignant melanoma of skin: Secondary | ICD-10-CM | POA: Diagnosis not present

## 2021-03-24 DIAGNOSIS — D2271 Melanocytic nevi of right lower limb, including hip: Secondary | ICD-10-CM | POA: Diagnosis not present

## 2021-03-24 DIAGNOSIS — D2261 Melanocytic nevi of right upper limb, including shoulder: Secondary | ICD-10-CM | POA: Diagnosis not present

## 2021-03-24 DIAGNOSIS — Z85828 Personal history of other malignant neoplasm of skin: Secondary | ICD-10-CM | POA: Diagnosis not present

## 2021-03-24 DIAGNOSIS — J3089 Other allergic rhinitis: Secondary | ICD-10-CM | POA: Diagnosis not present

## 2021-03-24 DIAGNOSIS — J301 Allergic rhinitis due to pollen: Secondary | ICD-10-CM | POA: Diagnosis not present

## 2021-03-24 DIAGNOSIS — D045 Carcinoma in situ of skin of trunk: Secondary | ICD-10-CM | POA: Diagnosis not present

## 2021-03-24 DIAGNOSIS — D485 Neoplasm of uncertain behavior of skin: Secondary | ICD-10-CM | POA: Diagnosis not present

## 2021-03-24 DIAGNOSIS — J3081 Allergic rhinitis due to animal (cat) (dog) hair and dander: Secondary | ICD-10-CM | POA: Diagnosis not present

## 2021-03-24 DIAGNOSIS — X32XXXA Exposure to sunlight, initial encounter: Secondary | ICD-10-CM | POA: Diagnosis not present

## 2021-03-24 DIAGNOSIS — L570 Actinic keratosis: Secondary | ICD-10-CM | POA: Diagnosis not present

## 2021-03-26 DIAGNOSIS — I480 Paroxysmal atrial fibrillation: Secondary | ICD-10-CM | POA: Diagnosis not present

## 2021-03-26 DIAGNOSIS — Z7901 Long term (current) use of anticoagulants: Secondary | ICD-10-CM | POA: Diagnosis not present

## 2021-03-27 DIAGNOSIS — I10 Essential (primary) hypertension: Secondary | ICD-10-CM | POA: Diagnosis not present

## 2021-03-27 DIAGNOSIS — E785 Hyperlipidemia, unspecified: Secondary | ICD-10-CM | POA: Diagnosis not present

## 2021-03-27 DIAGNOSIS — I340 Nonrheumatic mitral (valve) insufficiency: Secondary | ICD-10-CM | POA: Diagnosis not present

## 2021-03-27 DIAGNOSIS — I4891 Unspecified atrial fibrillation: Secondary | ICD-10-CM | POA: Diagnosis not present

## 2021-03-27 DIAGNOSIS — R0602 Shortness of breath: Secondary | ICD-10-CM | POA: Diagnosis not present

## 2021-03-27 DIAGNOSIS — R079 Chest pain, unspecified: Secondary | ICD-10-CM | POA: Diagnosis not present

## 2021-03-27 DIAGNOSIS — I2510 Atherosclerotic heart disease of native coronary artery without angina pectoris: Secondary | ICD-10-CM | POA: Diagnosis not present

## 2021-03-31 DIAGNOSIS — J3081 Allergic rhinitis due to animal (cat) (dog) hair and dander: Secondary | ICD-10-CM | POA: Diagnosis not present

## 2021-03-31 DIAGNOSIS — J3089 Other allergic rhinitis: Secondary | ICD-10-CM | POA: Diagnosis not present

## 2021-03-31 DIAGNOSIS — J301 Allergic rhinitis due to pollen: Secondary | ICD-10-CM | POA: Diagnosis not present

## 2021-04-01 DIAGNOSIS — E669 Obesity, unspecified: Secondary | ICD-10-CM | POA: Diagnosis not present

## 2021-04-01 DIAGNOSIS — I25119 Atherosclerotic heart disease of native coronary artery with unspecified angina pectoris: Secondary | ICD-10-CM | POA: Diagnosis not present

## 2021-04-01 DIAGNOSIS — I10 Essential (primary) hypertension: Secondary | ICD-10-CM | POA: Diagnosis not present

## 2021-04-01 DIAGNOSIS — D6869 Other thrombophilia: Secondary | ICD-10-CM | POA: Diagnosis not present

## 2021-04-01 DIAGNOSIS — I4891 Unspecified atrial fibrillation: Secondary | ICD-10-CM | POA: Diagnosis not present

## 2021-04-01 DIAGNOSIS — F419 Anxiety disorder, unspecified: Secondary | ICD-10-CM | POA: Diagnosis not present

## 2021-04-01 DIAGNOSIS — E785 Hyperlipidemia, unspecified: Secondary | ICD-10-CM | POA: Diagnosis not present

## 2021-04-01 DIAGNOSIS — G629 Polyneuropathy, unspecified: Secondary | ICD-10-CM | POA: Diagnosis not present

## 2021-04-01 DIAGNOSIS — G4733 Obstructive sleep apnea (adult) (pediatric): Secondary | ICD-10-CM | POA: Diagnosis not present

## 2021-04-03 DIAGNOSIS — R079 Chest pain, unspecified: Secondary | ICD-10-CM | POA: Diagnosis not present

## 2021-04-07 DIAGNOSIS — G4733 Obstructive sleep apnea (adult) (pediatric): Secondary | ICD-10-CM | POA: Diagnosis not present

## 2021-04-07 DIAGNOSIS — J3081 Allergic rhinitis due to animal (cat) (dog) hair and dander: Secondary | ICD-10-CM | POA: Diagnosis not present

## 2021-04-07 DIAGNOSIS — J301 Allergic rhinitis due to pollen: Secondary | ICD-10-CM | POA: Diagnosis not present

## 2021-04-07 DIAGNOSIS — J3089 Other allergic rhinitis: Secondary | ICD-10-CM | POA: Diagnosis not present

## 2021-04-09 DIAGNOSIS — M109 Gout, unspecified: Secondary | ICD-10-CM | POA: Diagnosis not present

## 2021-04-09 DIAGNOSIS — I2510 Atherosclerotic heart disease of native coronary artery without angina pectoris: Secondary | ICD-10-CM | POA: Diagnosis not present

## 2021-04-09 DIAGNOSIS — I4891 Unspecified atrial fibrillation: Secondary | ICD-10-CM | POA: Diagnosis not present

## 2021-04-09 DIAGNOSIS — R0602 Shortness of breath: Secondary | ICD-10-CM | POA: Diagnosis not present

## 2021-04-09 DIAGNOSIS — R5383 Other fatigue: Secondary | ICD-10-CM | POA: Diagnosis not present

## 2021-04-09 DIAGNOSIS — R7303 Prediabetes: Secondary | ICD-10-CM | POA: Diagnosis not present

## 2021-04-09 DIAGNOSIS — E669 Obesity, unspecified: Secondary | ICD-10-CM | POA: Diagnosis not present

## 2021-04-09 DIAGNOSIS — E785 Hyperlipidemia, unspecified: Secondary | ICD-10-CM | POA: Diagnosis not present

## 2021-04-09 DIAGNOSIS — R079 Chest pain, unspecified: Secondary | ICD-10-CM | POA: Diagnosis not present

## 2021-04-09 DIAGNOSIS — I340 Nonrheumatic mitral (valve) insufficiency: Secondary | ICD-10-CM | POA: Diagnosis not present

## 2021-04-09 DIAGNOSIS — E782 Mixed hyperlipidemia: Secondary | ICD-10-CM | POA: Diagnosis not present

## 2021-04-09 DIAGNOSIS — I10 Essential (primary) hypertension: Secondary | ICD-10-CM | POA: Diagnosis not present

## 2021-04-14 DIAGNOSIS — C4492 Squamous cell carcinoma of skin, unspecified: Secondary | ICD-10-CM | POA: Diagnosis not present

## 2021-04-14 DIAGNOSIS — N1831 Chronic kidney disease, stage 3a: Secondary | ICD-10-CM | POA: Diagnosis not present

## 2021-04-14 DIAGNOSIS — E1122 Type 2 diabetes mellitus with diabetic chronic kidney disease: Secondary | ICD-10-CM | POA: Diagnosis not present

## 2021-04-14 DIAGNOSIS — G629 Polyneuropathy, unspecified: Secondary | ICD-10-CM | POA: Diagnosis not present

## 2021-04-14 DIAGNOSIS — F32A Depression, unspecified: Secondary | ICD-10-CM | POA: Diagnosis not present

## 2021-04-16 DIAGNOSIS — R072 Precordial pain: Secondary | ICD-10-CM | POA: Diagnosis not present

## 2021-04-20 DIAGNOSIS — I10 Essential (primary) hypertension: Secondary | ICD-10-CM | POA: Diagnosis not present

## 2021-04-20 DIAGNOSIS — G4730 Sleep apnea, unspecified: Secondary | ICD-10-CM | POA: Diagnosis not present

## 2021-04-20 DIAGNOSIS — I4891 Unspecified atrial fibrillation: Secondary | ICD-10-CM | POA: Diagnosis not present

## 2021-04-20 DIAGNOSIS — I340 Nonrheumatic mitral (valve) insufficiency: Secondary | ICD-10-CM | POA: Diagnosis not present

## 2021-04-20 DIAGNOSIS — E782 Mixed hyperlipidemia: Secondary | ICD-10-CM | POA: Diagnosis not present

## 2021-04-20 DIAGNOSIS — R079 Chest pain, unspecified: Secondary | ICD-10-CM | POA: Diagnosis not present

## 2021-04-20 DIAGNOSIS — R0602 Shortness of breath: Secondary | ICD-10-CM | POA: Diagnosis not present

## 2021-04-20 DIAGNOSIS — I509 Heart failure, unspecified: Secondary | ICD-10-CM | POA: Diagnosis not present

## 2021-04-20 DIAGNOSIS — I2510 Atherosclerotic heart disease of native coronary artery without angina pectoris: Secondary | ICD-10-CM | POA: Diagnosis not present

## 2021-04-23 DIAGNOSIS — L509 Urticaria, unspecified: Secondary | ICD-10-CM | POA: Diagnosis not present

## 2021-04-23 DIAGNOSIS — J301 Allergic rhinitis due to pollen: Secondary | ICD-10-CM | POA: Diagnosis not present

## 2021-04-23 DIAGNOSIS — J3089 Other allergic rhinitis: Secondary | ICD-10-CM | POA: Diagnosis not present

## 2021-04-23 DIAGNOSIS — J3081 Allergic rhinitis due to animal (cat) (dog) hair and dander: Secondary | ICD-10-CM | POA: Diagnosis not present

## 2021-04-23 DIAGNOSIS — Z7901 Long term (current) use of anticoagulants: Secondary | ICD-10-CM | POA: Diagnosis not present

## 2021-04-23 DIAGNOSIS — I480 Paroxysmal atrial fibrillation: Secondary | ICD-10-CM | POA: Diagnosis not present

## 2021-05-08 DIAGNOSIS — G4733 Obstructive sleep apnea (adult) (pediatric): Secondary | ICD-10-CM | POA: Diagnosis not present

## 2021-05-12 DIAGNOSIS — N181 Chronic kidney disease, stage 1: Secondary | ICD-10-CM | POA: Diagnosis not present

## 2021-05-12 DIAGNOSIS — C44629 Squamous cell carcinoma of skin of left upper limb, including shoulder: Secondary | ICD-10-CM | POA: Diagnosis not present

## 2021-05-12 DIAGNOSIS — I10 Essential (primary) hypertension: Secondary | ICD-10-CM | POA: Diagnosis not present

## 2021-05-15 DIAGNOSIS — J301 Allergic rhinitis due to pollen: Secondary | ICD-10-CM | POA: Diagnosis not present

## 2021-05-15 DIAGNOSIS — J3081 Allergic rhinitis due to animal (cat) (dog) hair and dander: Secondary | ICD-10-CM | POA: Diagnosis not present

## 2021-05-15 DIAGNOSIS — J3089 Other allergic rhinitis: Secondary | ICD-10-CM | POA: Diagnosis not present

## 2021-05-19 ENCOUNTER — Encounter: Payer: Self-pay | Admitting: Oncology

## 2021-05-19 ENCOUNTER — Inpatient Hospital Stay: Payer: Medicare PPO

## 2021-05-19 ENCOUNTER — Inpatient Hospital Stay: Payer: Medicare PPO | Attending: Oncology | Admitting: Oncology

## 2021-05-19 VITALS — BP 159/79 | HR 79 | Temp 98.1°F | Resp 18 | Wt 237.7 lb

## 2021-05-19 DIAGNOSIS — G4730 Sleep apnea, unspecified: Secondary | ICD-10-CM | POA: Insufficient documentation

## 2021-05-19 DIAGNOSIS — E785 Hyperlipidemia, unspecified: Secondary | ICD-10-CM | POA: Insufficient documentation

## 2021-05-19 DIAGNOSIS — Z809 Family history of malignant neoplasm, unspecified: Secondary | ICD-10-CM | POA: Diagnosis not present

## 2021-05-19 DIAGNOSIS — Z7901 Long term (current) use of anticoagulants: Secondary | ICD-10-CM | POA: Insufficient documentation

## 2021-05-19 DIAGNOSIS — Z85820 Personal history of malignant melanoma of skin: Secondary | ICD-10-CM

## 2021-05-19 DIAGNOSIS — I4891 Unspecified atrial fibrillation: Secondary | ICD-10-CM | POA: Diagnosis not present

## 2021-05-19 DIAGNOSIS — I129 Hypertensive chronic kidney disease with stage 1 through stage 4 chronic kidney disease, or unspecified chronic kidney disease: Secondary | ICD-10-CM | POA: Insufficient documentation

## 2021-05-19 DIAGNOSIS — Z79899 Other long term (current) drug therapy: Secondary | ICD-10-CM | POA: Diagnosis not present

## 2021-05-19 DIAGNOSIS — D649 Anemia, unspecified: Secondary | ICD-10-CM | POA: Diagnosis not present

## 2021-05-19 DIAGNOSIS — N189 Chronic kidney disease, unspecified: Secondary | ICD-10-CM | POA: Diagnosis not present

## 2021-05-19 DIAGNOSIS — G629 Polyneuropathy, unspecified: Secondary | ICD-10-CM | POA: Insufficient documentation

## 2021-05-19 DIAGNOSIS — Z800 Family history of malignant neoplasm of digestive organs: Secondary | ICD-10-CM | POA: Insufficient documentation

## 2021-05-19 DIAGNOSIS — M810 Age-related osteoporosis without current pathological fracture: Secondary | ICD-10-CM | POA: Insufficient documentation

## 2021-05-19 DIAGNOSIS — Z8042 Family history of malignant neoplasm of prostate: Secondary | ICD-10-CM | POA: Insufficient documentation

## 2021-05-19 LAB — CBC WITH DIFFERENTIAL/PLATELET
Abs Immature Granulocytes: 0.03 10*3/uL (ref 0.00–0.07)
Basophils Absolute: 0.1 10*3/uL (ref 0.0–0.1)
Basophils Relative: 1 %
Eosinophils Absolute: 0.2 10*3/uL (ref 0.0–0.5)
Eosinophils Relative: 3 %
HCT: 35.3 % — ABNORMAL LOW (ref 36.0–46.0)
Hemoglobin: 11.1 g/dL — ABNORMAL LOW (ref 12.0–15.0)
Immature Granulocytes: 0 %
Lymphocytes Relative: 31 %
Lymphs Abs: 2.2 10*3/uL (ref 0.7–4.0)
MCH: 29 pg (ref 26.0–34.0)
MCHC: 31.4 g/dL (ref 30.0–36.0)
MCV: 92.2 fL (ref 80.0–100.0)
Monocytes Absolute: 0.4 10*3/uL (ref 0.1–1.0)
Monocytes Relative: 5 %
Neutro Abs: 4.4 10*3/uL (ref 1.7–7.7)
Neutrophils Relative %: 60 %
Platelets: 229 10*3/uL (ref 150–400)
RBC: 3.83 MIL/uL — ABNORMAL LOW (ref 3.87–5.11)
RDW: 13.9 % (ref 11.5–15.5)
WBC: 7.3 10*3/uL (ref 4.0–10.5)
nRBC: 0 % (ref 0.0–0.2)

## 2021-05-19 LAB — RETIC PANEL
Immature Retic Fract: 11.5 % (ref 2.3–15.9)
RBC.: 3.8 MIL/uL — ABNORMAL LOW (ref 3.87–5.11)
Retic Count, Absolute: 92.7 10*3/uL (ref 19.0–186.0)
Retic Ct Pct: 2.4 % (ref 0.4–3.1)
Reticulocyte Hemoglobin: 32.2 pg (ref >27.9)

## 2021-05-19 LAB — FERRITIN: Ferritin: 24 ng/mL (ref 11–307)

## 2021-05-19 LAB — IRON AND TIBC
Iron: 151 ug/dL (ref 28–170)
Saturation Ratios: 49 % — ABNORMAL HIGH (ref 10.4–31.8)
TIBC: 311 ug/dL (ref 250–450)
UIBC: 160 ug/dL

## 2021-05-19 LAB — TECHNOLOGIST SMEAR REVIEW: Plt Morphology: ADEQUATE

## 2021-05-19 NOTE — Progress Notes (Signed)
Patient here to establish care  

## 2021-05-19 NOTE — Progress Notes (Signed)
Hematology/Oncology Consult note [facility] Telephone:([phone] [phone]   Patient Care Team: Perrin Maltese, MD as PCP - General (Internal Medicine)  REFERRING PROVIDER: Danelle Berry, NP  CHIEF COMPLAINTS/REASON FOR VISIT:  Evaluation of anemia  HISTORY OF PRESENTING ILLNESS:   Sarah Riddle is a  80 y.o.  female with PMH listed below was seen in consultation at the request of  Danelle Berry, NP  for evaluation of anemia Reviewed records from primary care provider 04/09/2021, patient had a CBC done which showed a hemoglobin of 10.9, MCV 89.  Normal total WBC, normal platelet count.  Creatinine is slightly elevated and 1.13, EGFR 49, normal bilirubin. TSH 1.48 6 05/12/2021, repeat hemoglobin 10.4, MCV 91, normal platelet and total WBC. Patient reports some fatigue.  Denies any abdominal pain, black or bloody stool. Patient is on chronic anticoagulation with Coumadin for history of A. fib. Patient reports that she has previously taken oral iron supplementation which caused diarrhea.  She has a personal history of melanoma x2 status post resection by dermatologist.  She has a family history significant for father and 2 brothers with prostate cancer, a sister with acute leukemia.  Mother with colon cancer.  Patient reports to have colonoscopy done recently.  No colonoscopy records and currently picks  Review of Systems  Constitutional:  Positive for fatigue. Negative for appetite change, chills and fever.  HENT:   Negative for hearing loss and voice change.   Eyes:  Negative for eye problems.  Respiratory:  Negative for chest tightness and cough.   Cardiovascular:  Negative for chest pain.  Gastrointestinal:  Negative for abdominal distention, abdominal pain and blood in stool.  Endocrine: Negative for hot flashes.  Genitourinary:  Negative for difficulty urinating and frequency.   Musculoskeletal:  Negative for arthralgias.  Skin:   Negative for itching and rash.  Neurological:  Negative for extremity weakness.  Hematological:  Negative for adenopathy.  Psychiatric/Behavioral:  Negative for confusion.    MEDICAL HISTORY:  Past Medical History:  Diagnosis Date   A-fib [facility])    Age related osteoporosis    Cancer ([place])    Melanoma x 2 -- Lt Leg -1976 Rt Arm - 2014   Chronic bilateral low back pain    Chronic kidney disease    Fibrocystic breast disease    GERD (gastroesophageal reflux disease)    Hyperlipidemia    Hypertension    IBS (irritable bowel syndrome)    Lumbago    Neuropathy    Obstructive sleep apnea    Osteoarthritis of shoulder     SURGICAL HISTORY: Past Surgical History:  Procedure Laterality Date   ABDOMINAL HYSTERECTOMY     BACK SURGERY     BREAST CYST ASPIRATION Left 2001   Negative   CARDIAC ELECTROPHYSIOLOGY MAPPING AND ABLATION     EYE SURGERY     TOTAL HIP ARTHROPLASTY Left     SOCIAL HISTORY: Social History   Socioeconomic History   Marital status: Married    Spouse name: Not on file   Number of children: Not on file   Years of education: Not on file   Highest education level: Not on file  Occupational History   Not on file  Tobacco Use   Smoking status: Never   Smokeless tobacco: Never  Substance and Sexual Activity   Alcohol use: No   Drug use: No   Sexual activity: Not on file  Other Topics Concern   Not on file  Social History Narrative   Not on file   Social Determinants of Health   Financial Resource Strain: Not on file  Food Insecurity: Not on file  Transportation Needs: Not on file  Physical Activity: Not on file  Stress: Not on file  Social Connections: Not on file  Intimate Partner Violence: Not on file    FAMILY HISTORY: Family History  Problem Relation Age of Onset   Colon cancer Mother    Prostate cancer Father    Cancer Sister    Acute myelogenous leukemia Sister    Prostate cancer Brother    Prostate cancer Brother    Breast cancer  Neg Hx     ALLERGIES:  is allergic to iodinated diagnostic agents, ibuprofen, lubiprostone, sulfa antibiotics, zolpidem, boniva [ibandronic acid], celecoxib, duloxetine, and tetracyclines & related.  MEDICATIONS:  Current Outpatient Medications  Medication Sig Dispense Refill   acetaminophen (TYLENOL) 500 MG tablet Take 500 mg by mouth 2 (two) times a day.     alendronate (FOSAMAX) 70 MG tablet Take 70 mg by mouth once a week. Take with a full glass of water on an empty stomach.     allopurinol (ZYLOPRIM) 100 MG tablet Take 100 mg by mouth daily.     amitriptyline (ELAVIL) 25 MG tablet Take 25 mg by mouth at bedtime.     Cranberry 125 MG TABS Take by mouth daily.     Cyanocobalamin (VITAMIN B 12 PO) Take 1,000 mg by mouth daily. Monday - Friday     ergocalciferol (VITAMIN D2) 1.25 MG (50000 UT) capsule Take 50,000 Units by mouth once a week.     ezetimibe (ZETIA) 10 MG tablet Take 10 mg by mouth daily.     fluticasone (FLONASE) 50 MCG/ACT nasal spray Place 2 sprays into both nostrils daily.     furosemide (LASIX) 20 MG tablet Take 1 tablet by mouth daily.     gabapentin (NEURONTIN) 800 MG tablet Take 1 tablet (800 mg total) by mouth in the morning, at noon, in the evening, and at bedtime. 360 tablet 1   isosorbide mononitrate (IMDUR) 30 MG 24 hr tablet Take 1 tablet by mouth daily.     levocetirizine (XYZAL) 5 MG tablet Take 5 mg by mouth daily.     losartan (COZAAR) 50 MG tablet Take 50 mg by mouth daily.     metoprolol succinate (TOPROL-XL) 25 MG 24 hr tablet Take 25 mg by mouth daily.     OVER THE COUNTER MEDICATION Weekly allergies injection but pt uncertain what medication is given and dose.     pantoprazole (PROTONIX) 40 MG tablet Take 20 mg by mouth daily.      rosuvastatin (CRESTOR) 40 MG tablet Take 40 mg by mouth daily.     sertraline (ZOLOFT) 25 MG tablet Take 25 mg by mouth 3 (three) times daily.      warfarin (COUMADIN) 2 MG tablet Take 2 mg by mouth daily. $RemoveBefo'1mg'TyDuuZsnUTi$  - sat, mon,  wed $Re'2mg'jfK$ - sun, tues, thurs, fri     atorvastatin (LIPITOR) 40 MG tablet Take 40 mg by mouth daily. (Patient not taking: Reported on 05/19/2021)     EPINEPHrine (EPIPEN IJ) Inject as directed. (Patient not taking: Reported on 05/19/2021)     nitroGLYCERIN (NITROSTAT) 0.4 MG SL tablet Place 0.4 mg under the tongue every 5 (five) minutes as needed for chest pain. (Patient not taking: Reported on 05/19/2021)     No current facility-administered medications for this visit.     PHYSICAL EXAMINATION:  ECOG PERFORMANCE STATUS: 1 - Symptomatic but completely ambulatory Vitals:   05/19/21 1111  BP: (!) 159/79  Pulse: 79  Resp: 18  Temp: 98.1 F (36.7 C)   Filed Weights   05/19/21 1111  Weight: 237 lb 11.2 oz (107.8 kg)    Physical Exam Constitutional:      General: She is not in acute distress.    Comments: Patient ambulates independently  HENT:     Head: Normocephalic and atraumatic.  Eyes:     General: No scleral icterus. Cardiovascular:     Rate and Rhythm: Normal rate and regular rhythm.     Heart sounds: Normal heart sounds.  Pulmonary:     Effort: Pulmonary effort is normal. No respiratory distress.     Breath sounds: No wheezing.  Abdominal:     General: Bowel sounds are normal. There is no distension.     Palpations: Abdomen is soft.  Musculoskeletal:        General: No deformity. Normal range of motion.     Cervical back: Normal range of motion and neck supple.  Skin:    General: Skin is warm and dry.     Findings: No erythema or rash.  Neurological:     Mental Status: She is alert and oriented to person, place, and time. Mental status is at baseline.     Cranial Nerves: No cranial nerve deficit.     Coordination: Coordination normal.  Psychiatric:        Mood and Affect: Mood normal.    LABORATORY DATA:  I have reviewed the data as listed Lab Results  Component Value Date   WBC 7.3 05/19/2021   HGB 11.1 (L) 05/19/2021   HCT 35.3 (L) 05/19/2021   MCV 92.2  05/19/2021   PLT 229 05/19/2021   No results for input(s): NA, K, CL, CO2, GLUCOSE, BUN, CREATININE, CALCIUM, GFRNONAA, GFRAA, PROT, ALBUMIN, AST, ALT, ALKPHOS, BILITOT, BILIDIR, IBILI in the last 8760 hours. Iron/TIBC/Ferritin/ %Sat    Component Value Date/Time   IRON 151 05/19/2021 1153   TIBC 311 05/19/2021 1153   FERRITIN 24 05/19/2021 1153   IRONPCTSAT 49 (H) 05/19/2021 1153      RADIOGRAPHIC STUDIES: I have personally reviewed the radiological images as listed and agreed with the findings in the report. No results found.     ASSESSMENT & PLAN:  1. Normocytic anemia   2. Family history of cancer   3. History of melanoma    Labs reviewed and discussed with patient.  Previous blood work also shows mild decrease of kidney function.   Recommend CBC, smear, protein electrophoresis, iron TIBC ferritin.  Family history of cancer and personal history of melanoma. Will discuss about genetic counseling referral at the next visit.  Orders Placed This Encounter  Procedures   Iron and TIBC    Standing Status:   Future    Number of Occurrences:   1    Standing Expiration Date:   05/19/2022   Ferritin    Standing Status:   Future    Number of Occurrences:   1    Standing Expiration Date:   11/19/2021   Technologist smear review    Standing Status:   Future    Number of Occurrences:   1    Standing Expiration Date:   05/19/2022   CBC with Differential/Platelet    Standing Status:   Future    Number of Occurrences:   1    Standing Expiration Date:   05/19/2022  Protein electrophoresis, serum    Standing Status:   Future    Number of Occurrences:   1    Standing Expiration Date:   05/19/2022   Retic Panel    Standing Status:   Future    Number of Occurrences:   1    Standing Expiration Date:   05/19/2022    All questions were answered. The patient knows to call the clinic with any problems questions or concerns.  cc Evern Bio H, NP    Return of visit: 2 weeks to  go over blood work results. Thank you for this kind referral and the opportunity to participate in the care of this patient. A copy of today's note is routed to referring provider    Earlie Server, MD, PhD Hematology Oncology [place] [facility]  05/19/2021

## 2021-05-20 DIAGNOSIS — Z809 Family history of malignant neoplasm, unspecified: Secondary | ICD-10-CM | POA: Insufficient documentation

## 2021-05-20 DIAGNOSIS — Z85820 Personal history of malignant melanoma of skin: Secondary | ICD-10-CM | POA: Insufficient documentation

## 2021-05-21 ENCOUNTER — Other Ambulatory Visit: Payer: Self-pay

## 2021-05-21 DIAGNOSIS — D649 Anemia, unspecified: Secondary | ICD-10-CM

## 2021-05-21 DIAGNOSIS — G4730 Sleep apnea, unspecified: Secondary | ICD-10-CM | POA: Diagnosis not present

## 2021-05-21 LAB — PROTEIN ELECTROPHORESIS, SERUM
A/G Ratio: 1.5 (ref 0.7–1.7)
Albumin ELP: 3.6 g/dL (ref 2.9–4.4)
Alpha-1-Globulin: 0.2 g/dL (ref 0.0–0.4)
Alpha-2-Globulin: 0.7 g/dL (ref 0.4–1.0)
Beta Globulin: 0.8 g/dL (ref 0.7–1.3)
Gamma Globulin: 0.6 g/dL (ref 0.4–1.8)
Globulin, Total: 2.4 g/dL (ref 2.2–3.9)
Total Protein ELP: 6 g/dL (ref 6.0–8.5)

## 2021-05-21 LAB — PATHOLOGIST SMEAR REVIEW

## 2021-05-22 DIAGNOSIS — Z7901 Long term (current) use of anticoagulants: Secondary | ICD-10-CM | POA: Diagnosis not present

## 2021-05-22 DIAGNOSIS — I480 Paroxysmal atrial fibrillation: Secondary | ICD-10-CM | POA: Diagnosis not present

## 2021-05-26 DIAGNOSIS — C44729 Squamous cell carcinoma of skin of left lower limb, including hip: Secondary | ICD-10-CM | POA: Diagnosis not present

## 2021-05-26 DIAGNOSIS — D485 Neoplasm of uncertain behavior of skin: Secondary | ICD-10-CM | POA: Diagnosis not present

## 2021-05-26 DIAGNOSIS — D045 Carcinoma in situ of skin of trunk: Secondary | ICD-10-CM | POA: Diagnosis not present

## 2021-06-02 ENCOUNTER — Encounter: Payer: Self-pay | Admitting: Oncology

## 2021-06-02 ENCOUNTER — Other Ambulatory Visit: Payer: Self-pay

## 2021-06-02 ENCOUNTER — Inpatient Hospital Stay: Payer: Medicare PPO | Admitting: Oncology

## 2021-06-02 VITALS — BP 138/70 | HR 72 | Temp 96.7°F | Resp 18 | Wt 234.0 lb

## 2021-06-02 DIAGNOSIS — Z85820 Personal history of malignant melanoma of skin: Secondary | ICD-10-CM

## 2021-06-02 DIAGNOSIS — G629 Polyneuropathy, unspecified: Secondary | ICD-10-CM | POA: Diagnosis not present

## 2021-06-02 DIAGNOSIS — Z809 Family history of malignant neoplasm, unspecified: Secondary | ICD-10-CM

## 2021-06-02 DIAGNOSIS — I129 Hypertensive chronic kidney disease with stage 1 through stage 4 chronic kidney disease, or unspecified chronic kidney disease: Secondary | ICD-10-CM | POA: Diagnosis not present

## 2021-06-02 DIAGNOSIS — J3089 Other allergic rhinitis: Secondary | ICD-10-CM | POA: Diagnosis not present

## 2021-06-02 DIAGNOSIS — G4730 Sleep apnea, unspecified: Secondary | ICD-10-CM | POA: Diagnosis not present

## 2021-06-02 DIAGNOSIS — J3081 Allergic rhinitis due to animal (cat) (dog) hair and dander: Secondary | ICD-10-CM | POA: Diagnosis not present

## 2021-06-02 DIAGNOSIS — N1832 Chronic kidney disease, stage 3b: Secondary | ICD-10-CM | POA: Diagnosis not present

## 2021-06-02 DIAGNOSIS — D631 Anemia in chronic kidney disease: Secondary | ICD-10-CM | POA: Diagnosis not present

## 2021-06-02 DIAGNOSIS — D649 Anemia, unspecified: Secondary | ICD-10-CM | POA: Diagnosis not present

## 2021-06-02 DIAGNOSIS — M810 Age-related osteoporosis without current pathological fracture: Secondary | ICD-10-CM | POA: Diagnosis not present

## 2021-06-02 DIAGNOSIS — N189 Chronic kidney disease, unspecified: Secondary | ICD-10-CM | POA: Diagnosis not present

## 2021-06-02 DIAGNOSIS — E785 Hyperlipidemia, unspecified: Secondary | ICD-10-CM | POA: Diagnosis not present

## 2021-06-02 DIAGNOSIS — I4891 Unspecified atrial fibrillation: Secondary | ICD-10-CM | POA: Diagnosis not present

## 2021-06-02 DIAGNOSIS — J301 Allergic rhinitis due to pollen: Secondary | ICD-10-CM | POA: Diagnosis not present

## 2021-06-02 DIAGNOSIS — Z79899 Other long term (current) drug therapy: Secondary | ICD-10-CM | POA: Diagnosis not present

## 2021-06-02 NOTE — Progress Notes (Signed)
Pt and daughter in for follow up, pt states still very fatigued, weak and gets short of breath easily.

## 2021-06-02 NOTE — Progress Notes (Signed)
Hematology/Oncology progress note [facility] [phone] [phone]   Patient Care Team: Perrin Maltese, MD as PCP - General (Internal Medicine)  REFERRING PROVIDER: Perrin Maltese, MD  CHIEF COMPLAINTS/REASON FOR VISIT:  Follow up for anemia  HISTORY OF PRESENTING ILLNESS:   Sarah Riddle is a  80 y.o.  female with PMH listed below was seen in consultation at the request of  Perrin Maltese, MD  for evaluation of anemia Reviewed records from primary care provider 04/09/2021, patient had a CBC done which showed a hemoglobin of 10.9, MCV 89.  Normal total WBC, normal platelet count.  Creatinine is slightly elevated and 1.13, EGFR 49, normal bilirubin. TSH 1.48 6 05/12/2021, repeat hemoglobin 10.4, MCV 91, normal platelet and total WBC. Patient reports some fatigue.  Denies any abdominal pain, black or bloody stool. Patient is on chronic anticoagulation with Coumadin for history of A. fib. Patient reports that she has previously taken oral iron supplementation which caused diarrhea.  She has a personal history of melanoma x2 status post resection by dermatologist.  She has a family history significant for father and 2 brothers with prostate cancer, a sister with acute leukemia.  Mother with colon cancer.  Patient reports to have colonoscopy done recently.  No colonoscopy records and currently picks   Sarah Riddle is a 80 y.o. female who has above history reviewed by me today presents for follow up visit for anemia Problems and complaints are listed below: Patient has had blood work done since last visit and presents to discuss results.  No new complaints.   Review of Systems  Constitutional:  Positive for fatigue. Negative for appetite change, chills and fever.  HENT:   Negative for hearing loss and voice change.   Eyes:  Negative for eye problems.  Respiratory:  Negative for chest tightness and cough.    Cardiovascular:  Negative for chest pain.  Gastrointestinal:  Negative for abdominal distention, abdominal pain and blood in stool.  Endocrine: Negative for hot flashes.  Genitourinary:  Negative for difficulty urinating and frequency.   Musculoskeletal:  Negative for arthralgias.  Skin:  Negative for itching and rash.  Neurological:  Negative for extremity weakness.  Hematological:  Negative for adenopathy.  Psychiatric/Behavioral:  Negative for confusion.    MEDICAL HISTORY:  Past Medical History:  Diagnosis Date   A-fib [facility])    Age related osteoporosis    Cancer ([place])    Melanoma x 2 -- Lt Leg -1976 Rt Arm - 2014   Chronic bilateral low back pain    Chronic kidney disease    Fibrocystic breast disease    GERD (gastroesophageal reflux disease)    Hyperlipidemia    Hypertension    IBS (irritable bowel syndrome)    Lumbago    Neuropathy    Obstructive sleep apnea    Osteoarthritis of shoulder     SURGICAL HISTORY: Past Surgical History:  Procedure Laterality Date   ABDOMINAL HYSTERECTOMY     BACK SURGERY     BREAST CYST ASPIRATION Left 2001   Negative   CARDIAC ELECTROPHYSIOLOGY MAPPING AND ABLATION     EYE SURGERY     TOTAL HIP ARTHROPLASTY Left     SOCIAL HISTORY: Social History   Socioeconomic History   Marital status: Married    Spouse name: Not on file   Number of children: Not on file   Years of education: Not on file   Highest education level: Not on  file  Occupational History   Not on file  Tobacco Use   Smoking status: Never   Smokeless tobacco: Never  Substance and Sexual Activity   Alcohol use: No   Drug use: No   Sexual activity: Not on file  Other Topics Concern   Not on file  Social History Narrative   Not on file   Social Determinants of Health   Financial Resource Strain: Not on file  Food Insecurity: Not on file  Transportation Needs: Not on file  Physical Activity: Not on file  Stress: Not on file  Social Connections: Not on  file  Intimate Partner Violence: Not on file    FAMILY HISTORY: Family History  Problem Relation Age of Onset   Colon cancer Mother    Prostate cancer Father    Cancer Sister    Acute myelogenous leukemia Sister    Prostate cancer Brother    Prostate cancer Brother    Breast cancer Neg Hx     ALLERGIES:  is allergic to iodinated diagnostic agents, ibuprofen, lubiprostone, sulfa antibiotics, zolpidem, boniva [ibandronic acid], celecoxib, duloxetine, and tetracyclines & related.  MEDICATIONS:  Current Outpatient Medications  Medication Sig Dispense Refill   acetaminophen (TYLENOL) 500 MG tablet Take 500 mg by mouth 2 (two) times a day.     alendronate (FOSAMAX) 70 MG tablet Take 70 mg by mouth once a week. Take with a full glass of water on an empty stomach.     allopurinol (ZYLOPRIM) 100 MG tablet Take 100 mg by mouth daily.     amitriptyline (ELAVIL) 25 MG tablet Take 25 mg by mouth at bedtime.     atorvastatin (LIPITOR) 40 MG tablet Take 40 mg by mouth daily.     Cranberry 125 MG TABS Take by mouth daily.     Cyanocobalamin (VITAMIN B 12 PO) Take 1,000 mg by mouth daily. Monday - Friday     ergocalciferol (VITAMIN D2) 1.25 MG (50000 UT) capsule Take 50,000 Units by mouth once a week.     ezetimibe (ZETIA) 10 MG tablet Take 10 mg by mouth daily.     ferrous sulfate 325 (65 FE) MG tablet Take 325 mg by mouth daily with breakfast.     fluticasone (FLONASE) 50 MCG/ACT nasal spray Place 2 sprays into both nostrils daily.     furosemide (LASIX) 20 MG tablet Take 1 tablet by mouth daily.     gabapentin (NEURONTIN) 800 MG tablet Take 1 tablet (800 mg total) by mouth in the morning, at noon, in the evening, and at bedtime. 360 tablet 1   isosorbide mononitrate (IMDUR) 60 MG 24 hr tablet Take 60 mg by mouth daily.     levocetirizine (XYZAL) 5 MG tablet Take 5 mg by mouth daily.     losartan (COZAAR) 50 MG tablet Take 50 mg by mouth daily.     metoprolol succinate (TOPROL-XL) 25 MG 24 hr  tablet Take 25 mg by mouth daily.     OVER THE COUNTER MEDICATION Weekly allergies injection but pt uncertain what medication is given and dose.     pantoprazole (PROTONIX) 40 MG tablet Take 20 mg by mouth daily.      rosuvastatin (CRESTOR) 40 MG tablet Take 40 mg by mouth daily.     sertraline (ZOLOFT) 25 MG tablet Take 25 mg by mouth 3 (three) times daily.      warfarin (COUMADIN) 2 MG tablet Take 2 mg by mouth daily. $RemoveBefo'1mg'rREfnuyKuni$  - sat, mon, wed $Remove'2mg'DWqTwtH$ - sun,  tues, thurs, fri     EPINEPHrine (EPIPEN IJ) Inject as directed. (Patient not taking: No sig reported)     nitroGLYCERIN (NITROSTAT) 0.4 MG SL tablet Place 0.4 mg under the tongue every 5 (five) minutes as needed for chest pain. (Patient not taking: No sig reported)     No current facility-administered medications for this visit.     PHYSICAL EXAMINATION: ECOG PERFORMANCE STATUS: 1 - Symptomatic but completely ambulatory Vitals:   06/02/21 1006  BP: 138/70  Pulse: 72  Resp: 18  Temp: (!) 96.7 F (35.9 C)  SpO2: 99%   Filed Weights   06/02/21 1006  Weight: 234 lb (106.1 kg)    Physical Exam Constitutional:      General: She is not in acute distress.    Comments: Patient ambulates independently  HENT:     Head: Normocephalic and atraumatic.  Eyes:     General: No scleral icterus. Cardiovascular:     Rate and Rhythm: Normal rate and regular rhythm.     Heart sounds: Normal heart sounds.  Pulmonary:     Effort: Pulmonary effort is normal. No respiratory distress.     Breath sounds: No wheezing.  Abdominal:     General: Bowel sounds are normal. There is no distension.     Palpations: Abdomen is soft.  Musculoskeletal:        General: No deformity. Normal range of motion.     Cervical back: Normal range of motion and neck supple.  Skin:    General: Skin is warm and dry.     Findings: No erythema or rash.  Neurological:     Mental Status: She is alert and oriented to person, place, and time. Mental status is at baseline.      Cranial Nerves: No cranial nerve deficit.     Coordination: Coordination normal.  Psychiatric:        Mood and Affect: Mood normal.    LABORATORY DATA:  I have reviewed the data as listed Lab Results  Component Value Date   WBC 7.3 05/19/2021   HGB 11.1 (L) 05/19/2021   HCT 35.3 (L) 05/19/2021   MCV 92.2 05/19/2021   PLT 229 05/19/2021   No results for input(s): NA, K, CL, CO2, GLUCOSE, BUN, CREATININE, CALCIUM, GFRNONAA, GFRAA, PROT, ALBUMIN, AST, ALT, ALKPHOS, BILITOT, BILIDIR, IBILI in the last 8760 hours. Iron/TIBC/Ferritin/ %Sat    Component Value Date/Time   IRON 151 05/19/2021 1153   TIBC 311 05/19/2021 1153   FERRITIN 24 05/19/2021 1153   IRONPCTSAT 49 (H) 05/19/2021 1153      RADIOGRAPHIC STUDIES: I have personally reviewed the radiological images as listed and agreed with the findings in the report. No results found.     ASSESSMENT & PLAN:  1. Anemia due to stage 3b chronic kidney disease ([place])   2. History of melanoma   3. Family history of cancer    Normocytic anemia, likely secondary to chronic kidney disease. Labs reviewed and discussed with patient Hemoglobin is 11.1.  MCV 92.2.  Patient has normal ferritin level of 24.  Slightly increased iron saturation.  We will follow-up and repeat iron panel again at the next visit.  There is no obvious iron deficiency. SPEP is negative for M protein.  Pathology smear showed normal morphologies.  Discussed with the patient that anemia is most likely due to chronic kidney disease.  Currently hemoglobin is about 10.  No intervention needed at this point.  I recommend patient to continue oral iron  supplementation.  Follow-up with [place] in 4 months with repeat blood work.  Chronic kidney disease, I will repeat her kidney function the next visit. Family history of cancer and personal history of melanoma. Discussed about recommendation of genetic testing.  Patient wants to defer at this point.  She will let me know if she  changes her mind.  Orders Placed This Encounter  Procedures   Iron and TIBC    Standing Status:   Future    Standing Expiration Date:   06/02/2022   Ferritin    Standing Status:   Future    Standing Expiration Date:   12/01/2021   CBC with Differential/Platelet    Standing Status:   Future    Standing Expiration Date:   06/02/2022   Retic Panel    Standing Status:   Future    Standing Expiration Date:   06/02/2022   Protein Electro, Random Urine    Standing Status:   Future    Standing Expiration Date:   06/02/2022     All questions were answered. The patient knows to call the clinic with any problems questions or concerns.  cc Perrin Maltese, MD    Return of visit:  4 months   Earlie Server, MD, PhD Hematology Oncology [facility]  06/02/2021

## 2021-06-03 DIAGNOSIS — C44729 Squamous cell carcinoma of skin of left lower limb, including hip: Secondary | ICD-10-CM | POA: Diagnosis not present

## 2021-06-08 DIAGNOSIS — G4733 Obstructive sleep apnea (adult) (pediatric): Secondary | ICD-10-CM | POA: Diagnosis not present

## 2021-06-19 DIAGNOSIS — I480 Paroxysmal atrial fibrillation: Secondary | ICD-10-CM | POA: Diagnosis not present

## 2021-06-19 DIAGNOSIS — Z7901 Long term (current) use of anticoagulants: Secondary | ICD-10-CM | POA: Diagnosis not present

## 2021-06-21 DIAGNOSIS — G4730 Sleep apnea, unspecified: Secondary | ICD-10-CM | POA: Diagnosis not present

## 2021-06-23 DIAGNOSIS — I10 Essential (primary) hypertension: Secondary | ICD-10-CM | POA: Diagnosis not present

## 2021-06-23 DIAGNOSIS — I4891 Unspecified atrial fibrillation: Secondary | ICD-10-CM | POA: Diagnosis not present

## 2021-06-23 DIAGNOSIS — I340 Nonrheumatic mitral (valve) insufficiency: Secondary | ICD-10-CM | POA: Diagnosis not present

## 2021-06-23 DIAGNOSIS — I2510 Atherosclerotic heart disease of native coronary artery without angina pectoris: Secondary | ICD-10-CM | POA: Diagnosis not present

## 2021-07-08 DIAGNOSIS — G4733 Obstructive sleep apnea (adult) (pediatric): Secondary | ICD-10-CM | POA: Diagnosis not present

## 2021-07-13 DIAGNOSIS — E785 Hyperlipidemia, unspecified: Secondary | ICD-10-CM | POA: Diagnosis not present

## 2021-07-13 DIAGNOSIS — E782 Mixed hyperlipidemia: Secondary | ICD-10-CM | POA: Diagnosis not present

## 2021-07-13 DIAGNOSIS — I10 Essential (primary) hypertension: Secondary | ICD-10-CM | POA: Diagnosis not present

## 2021-07-13 DIAGNOSIS — E1122 Type 2 diabetes mellitus with diabetic chronic kidney disease: Secondary | ICD-10-CM | POA: Diagnosis not present

## 2021-07-13 DIAGNOSIS — E669 Obesity, unspecified: Secondary | ICD-10-CM | POA: Diagnosis not present

## 2021-07-15 DIAGNOSIS — M1990 Unspecified osteoarthritis, unspecified site: Secondary | ICD-10-CM | POA: Diagnosis not present

## 2021-07-15 DIAGNOSIS — E782 Mixed hyperlipidemia: Secondary | ICD-10-CM | POA: Diagnosis not present

## 2021-07-15 DIAGNOSIS — I10 Essential (primary) hypertension: Secondary | ICD-10-CM | POA: Diagnosis not present

## 2021-07-15 DIAGNOSIS — R7303 Prediabetes: Secondary | ICD-10-CM | POA: Diagnosis not present

## 2021-07-15 DIAGNOSIS — I4891 Unspecified atrial fibrillation: Secondary | ICD-10-CM | POA: Diagnosis not present

## 2021-07-15 DIAGNOSIS — Z23 Encounter for immunization: Secondary | ICD-10-CM | POA: Diagnosis not present

## 2021-07-17 DIAGNOSIS — Z7901 Long term (current) use of anticoagulants: Secondary | ICD-10-CM | POA: Diagnosis not present

## 2021-07-17 DIAGNOSIS — I480 Paroxysmal atrial fibrillation: Secondary | ICD-10-CM | POA: Diagnosis not present

## 2021-07-21 DIAGNOSIS — G4730 Sleep apnea, unspecified: Secondary | ICD-10-CM | POA: Diagnosis not present

## 2021-08-02 ENCOUNTER — Other Ambulatory Visit: Payer: Self-pay

## 2021-08-02 ENCOUNTER — Encounter: Payer: Self-pay | Admitting: Emergency Medicine

## 2021-08-02 ENCOUNTER — Observation Stay
Admission: EM | Admit: 2021-08-02 | Discharge: 2021-08-04 | Disposition: A | Discharge status: Home / Self Care | Payer: Medicare PPO | Attending: Internal Medicine | Admitting: Internal Medicine

## 2021-08-02 ENCOUNTER — Emergency Department: Payer: Medicare PPO

## 2021-08-02 DIAGNOSIS — I10 Essential (primary) hypertension: Secondary | ICD-10-CM | POA: Insufficient documentation

## 2021-08-02 DIAGNOSIS — R0600 Dyspnea, unspecified: Secondary | ICD-10-CM | POA: Diagnosis not present

## 2021-08-02 DIAGNOSIS — J189 Pneumonia, unspecified organism: Secondary | ICD-10-CM | POA: Diagnosis not present

## 2021-08-02 DIAGNOSIS — J209 Acute bronchitis, unspecified: Secondary | ICD-10-CM | POA: Diagnosis present

## 2021-08-02 DIAGNOSIS — G894 Chronic pain syndrome: Secondary | ICD-10-CM | POA: Diagnosis present

## 2021-08-02 DIAGNOSIS — R2681 Unsteadiness on feet: Secondary | ICD-10-CM | POA: Diagnosis not present

## 2021-08-02 DIAGNOSIS — R911 Solitary pulmonary nodule: Secondary | ICD-10-CM | POA: Diagnosis not present

## 2021-08-02 DIAGNOSIS — R918 Other nonspecific abnormal finding of lung field: Secondary | ICD-10-CM | POA: Diagnosis not present

## 2021-08-02 DIAGNOSIS — R0902 Hypoxemia: Secondary | ICD-10-CM | POA: Insufficient documentation

## 2021-08-02 DIAGNOSIS — Z7901 Long term (current) use of anticoagulants: Secondary | ICD-10-CM

## 2021-08-02 DIAGNOSIS — M6281 Muscle weakness (generalized): Secondary | ICD-10-CM | POA: Insufficient documentation

## 2021-08-02 DIAGNOSIS — R0602 Shortness of breath: Secondary | ICD-10-CM | POA: Diagnosis not present

## 2021-08-02 DIAGNOSIS — Z79899 Other long term (current) drug therapy: Secondary | ICD-10-CM | POA: Insufficient documentation

## 2021-08-02 DIAGNOSIS — J9801 Acute bronchospasm: Secondary | ICD-10-CM | POA: Diagnosis not present

## 2021-08-02 DIAGNOSIS — J9601 Acute respiratory failure with hypoxia: Secondary | ICD-10-CM | POA: Diagnosis present

## 2021-08-02 DIAGNOSIS — J849 Interstitial pulmonary disease, unspecified: Secondary | ICD-10-CM | POA: Diagnosis not present

## 2021-08-02 DIAGNOSIS — Z20822 Contact with and (suspected) exposure to covid-19: Secondary | ICD-10-CM | POA: Diagnosis not present

## 2021-08-02 DIAGNOSIS — I480 Paroxysmal atrial fibrillation: Secondary | ICD-10-CM | POA: Diagnosis present

## 2021-08-02 DIAGNOSIS — J479 Bronchiectasis, uncomplicated: Secondary | ICD-10-CM | POA: Diagnosis not present

## 2021-08-02 DIAGNOSIS — I700 Atherosclerosis of aorta: Secondary | ICD-10-CM | POA: Diagnosis not present

## 2021-08-02 LAB — CBC WITH DIFFERENTIAL/PLATELET
Abs Immature Granulocytes: 0.02 10*3/uL (ref 0.00–0.07)
Basophils Absolute: 0 10*3/uL (ref 0.0–0.1)
Basophils Relative: 1 %
Eosinophils Absolute: 0.6 10*3/uL — ABNORMAL HIGH (ref 0.0–0.5)
Eosinophils Relative: 7 %
HCT: 38.1 % (ref 36.0–46.0)
Hemoglobin: 12.4 g/dL (ref 12.0–15.0)
Immature Granulocytes: 0 %
Lymphocytes Relative: 21 %
Lymphs Abs: 1.7 10*3/uL (ref 0.7–4.0)
MCH: 30.3 pg (ref 26.0–34.0)
MCHC: 32.5 g/dL (ref 30.0–36.0)
MCV: 93.2 fL (ref 80.0–100.0)
Monocytes Absolute: 0.6 10*3/uL (ref 0.1–1.0)
Monocytes Relative: 7 %
Neutro Abs: 5.4 10*3/uL (ref 1.7–7.7)
Neutrophils Relative %: 64 %
Platelets: 200 10*3/uL (ref 150–400)
RBC: 4.09 MIL/uL (ref 3.87–5.11)
RDW: 12.8 % (ref 11.5–15.5)
WBC: 8.3 10*3/uL (ref 4.0–10.5)
nRBC: 0 % (ref 0.0–0.2)

## 2021-08-02 LAB — COMPREHENSIVE METABOLIC PANEL
ALT: 15 U/L (ref 0–44)
AST: 20 U/L (ref 15–41)
Albumin: 3.8 g/dL (ref 3.5–5.0)
Alkaline Phosphatase: 62 U/L (ref 38–126)
Anion gap: 7 (ref 5–15)
BUN: 19 mg/dL (ref 8–23)
CO2: 29 mmol/L (ref 22–32)
Calcium: 9.3 mg/dL (ref 8.9–10.3)
Chloride: 104 mmol/L (ref 98–111)
Creatinine, Ser: 0.95 mg/dL (ref 0.44–1.00)
GFR, Estimated: >60 mL/min (ref >60)
Glucose, Bld: 115 mg/dL — ABNORMAL HIGH (ref 70–99)
Potassium: 3.9 mmol/L (ref 3.5–5.1)
Sodium: 140 mmol/L (ref 135–145)
Total Bilirubin: 0.8 mg/dL (ref 0.3–1.2)
Total Protein: 7 g/dL (ref 6.5–8.1)

## 2021-08-02 LAB — TROPONIN I (HIGH SENSITIVITY): Troponin I (High Sensitivity): 16 ng/L (ref <18)

## 2021-08-02 NOTE — ED Provider Notes (Signed)
Emergency Medicine Provider Triage Evaluation Note  Sarah Riddle , a 80 y.o. female  was evaluated in triage.  Pt complains of shortness of breath and cough for approximately 1 week.  Patient has cough productive for green sputum production.  She has had some progressively worsening shortness of breath at home.  No current chest tightness or chest pain.  Patient endorses low-grade fever at home.  Review of Systems  Positive: Patient has cough and fever  Negative: No chest pain.   Physical Exam  There were no vitals taken for this visit. Gen:   Awake, no distress   Resp:  Patient has cough and fever MSK:   Moves extremities without difficulty  Other:    Medical Decision Making  Medically screening exam initiated at 5:06 PM.  Appropriate orders placed.  Sarah Riddle was informed that the remainder of the evaluation will be completed by another provider, this initial triage assessment does not replace that evaluation, and the importance of remaining in the ED until their evaluation is complete.     Vallarie Mare Keats, PA-C 08/02/21 1707    Merlyn Lot, MD 08/04/21 1754

## 2021-08-02 NOTE — ED Triage Notes (Signed)
Pt to ED via POV, pt states that she was seen at [facility] for sough and shortness of breath. Pt reports that her symptoms started on Thursday. Pt states that last night she started running fever and vomited yesterday afternoon. Pt states that she wears a CPAP at night but does not normally wear O2. Pt SpO2 in triage 87-90%. Pt is able to speak in complete sentences and is in NAD at this time. Pt will be placed in 2 liter [place].

## 2021-08-03 ENCOUNTER — Emergency Department: Payer: Medicare PPO

## 2021-08-03 DIAGNOSIS — G894 Chronic pain syndrome: Secondary | ICD-10-CM

## 2021-08-03 DIAGNOSIS — Z7901 Long term (current) use of anticoagulants: Secondary | ICD-10-CM

## 2021-08-03 DIAGNOSIS — R911 Solitary pulmonary nodule: Secondary | ICD-10-CM | POA: Diagnosis not present

## 2021-08-03 DIAGNOSIS — R0902 Hypoxemia: Secondary | ICD-10-CM | POA: Diagnosis not present

## 2021-08-03 DIAGNOSIS — G4733 Obstructive sleep apnea (adult) (pediatric): Secondary | ICD-10-CM | POA: Diagnosis not present

## 2021-08-03 DIAGNOSIS — J849 Interstitial pulmonary disease, unspecified: Secondary | ICD-10-CM | POA: Diagnosis not present

## 2021-08-03 DIAGNOSIS — I10 Essential (primary) hypertension: Secondary | ICD-10-CM

## 2021-08-03 DIAGNOSIS — J479 Bronchiectasis, uncomplicated: Secondary | ICD-10-CM | POA: Diagnosis not present

## 2021-08-03 DIAGNOSIS — J9601 Acute respiratory failure with hypoxia: Secondary | ICD-10-CM | POA: Diagnosis present

## 2021-08-03 DIAGNOSIS — J9801 Acute bronchospasm: Secondary | ICD-10-CM | POA: Diagnosis not present

## 2021-08-03 DIAGNOSIS — I700 Atherosclerosis of aorta: Secondary | ICD-10-CM | POA: Diagnosis not present

## 2021-08-03 DIAGNOSIS — J189 Pneumonia, unspecified organism: Secondary | ICD-10-CM | POA: Diagnosis not present

## 2021-08-03 DIAGNOSIS — J209 Acute bronchitis, unspecified: Secondary | ICD-10-CM | POA: Diagnosis not present

## 2021-08-03 DIAGNOSIS — R918 Other nonspecific abnormal finding of lung field: Secondary | ICD-10-CM | POA: Diagnosis not present

## 2021-08-03 DIAGNOSIS — R0600 Dyspnea, unspecified: Secondary | ICD-10-CM | POA: Diagnosis not present

## 2021-08-03 LAB — RESPIRATORY PANEL BY PCR

## 2021-08-03 LAB — HIV ANTIBODY (ROUTINE TESTING W REFLEX): HIV Screen 4th Generation wRfx: NONREACTIVE

## 2021-08-03 LAB — URINALYSIS, ROUTINE W REFLEX MICROSCOPIC
Bacteria, UA: NONE SEEN
Bilirubin Urine: NEGATIVE
Glucose, UA: NEGATIVE mg/dL
Ketones, ur: 5 mg/dL — AB
Nitrite: NEGATIVE
Protein, ur: NEGATIVE mg/dL
Specific Gravity, Urine: 1.016 (ref 1.005–1.030)
pH: 5 (ref 5.0–8.0)

## 2021-08-03 LAB — PROCALCITONIN: Procalcitonin: <0.1 ng/mL

## 2021-08-03 LAB — PROTIME-INR
INR: 2.3 — ABNORMAL HIGH (ref 0.8–1.2)
Prothrombin Time: 25 seconds — ABNORMAL HIGH (ref 11.4–15.2)

## 2021-08-03 LAB — LACTIC ACID, PLASMA: Lactic Acid, Venous: 1.1 mmol/L (ref 0.5–1.9)

## 2021-08-03 LAB — RESP PANEL BY RT-PCR (FLU A&B, COVID) ARPGX2
Influenza A by PCR: NEGATIVE
Influenza B by PCR: NEGATIVE
SARS Coronavirus 2 by RT PCR: NEGATIVE

## 2021-08-03 LAB — STREP PNEUMONIAE URINARY ANTIGEN: Strep Pneumo Urinary Antigen: NEGATIVE

## 2021-08-03 LAB — TROPONIN I (HIGH SENSITIVITY): Troponin I (High Sensitivity): 16 ng/L (ref <18)

## 2021-08-03 MED ORDER — AMITRIPTYLINE HCL 50 MG PO TABS
25.0000 mg | ORAL_TABLET | Freq: Every day | ORAL | Status: DC
  Administered 2021-08-03: 25 mg via ORAL
  Filled 2021-08-03: qty 1

## 2021-08-03 MED ORDER — SODIUM CHLORIDE 0.9 % IV SOLN
1.0000 g | Freq: Once | INTRAVENOUS | Status: DC
  Filled 2021-08-03: qty 10

## 2021-08-03 MED ORDER — ROSUVASTATIN CALCIUM 20 MG PO TABS
40.0000 mg | ORAL_TABLET | Freq: Every day | ORAL | Status: DC
  Administered 2021-08-03: 40 mg via ORAL
  Filled 2021-08-03 (×3): qty 2

## 2021-08-03 MED ORDER — METHYLPREDNISOLONE SODIUM SUCC 40 MG IJ SOLR
40.0000 mg | Freq: Two times a day (BID) | INTRAMUSCULAR | Status: AC
  Administered 2021-08-03 – 2021-08-04 (×2): 40 mg via INTRAVENOUS
  Filled 2021-08-03 (×2): qty 1

## 2021-08-03 MED ORDER — SERTRALINE HCL 50 MG PO TABS
25.0000 mg | ORAL_TABLET | Freq: Three times a day (TID) | ORAL | Status: DC
  Administered 2021-08-03 – 2021-08-04 (×3): 25 mg via ORAL
  Filled 2021-08-03 (×3): qty 1

## 2021-08-03 MED ORDER — ALBUTEROL SULFATE (2.5 MG/3ML) 0.083% IN NEBU
2.5000 mg | INHALATION_SOLUTION | RESPIRATORY_TRACT | Status: DC | PRN
  Administered 2021-08-04: 2.5 mg via RESPIRATORY_TRACT
  Filled 2021-08-03: qty 3

## 2021-08-03 MED ORDER — ISOSORBIDE MONONITRATE ER 60 MG PO TB24
60.0000 mg | ORAL_TABLET | Freq: Every day | ORAL | Status: DC
  Administered 2021-08-03 – 2021-08-04 (×2): 60 mg via ORAL
  Filled 2021-08-03 (×2): qty 1

## 2021-08-03 MED ORDER — METHYLPREDNISOLONE SODIUM SUCC 125 MG IJ SOLR
125.0000 mg | Freq: Once | INTRAMUSCULAR | Status: AC
  Administered 2021-08-03: 125 mg via INTRAVENOUS
  Filled 2021-08-03: qty 2

## 2021-08-03 MED ORDER — LOSARTAN POTASSIUM 50 MG PO TABS
50.0000 mg | ORAL_TABLET | Freq: Every day | ORAL | Status: DC
  Administered 2021-08-03 – 2021-08-04 (×2): 50 mg via ORAL
  Filled 2021-08-03 (×2): qty 1

## 2021-08-03 MED ORDER — METOPROLOL SUCCINATE ER 50 MG PO TB24
25.0000 mg | ORAL_TABLET | Freq: Every day | ORAL | Status: DC
  Administered 2021-08-03 – 2021-08-04 (×2): 25 mg via ORAL
  Filled 2021-08-03 (×2): qty 1

## 2021-08-03 MED ORDER — ALLOPURINOL 100 MG PO TABS
100.0000 mg | ORAL_TABLET | Freq: Every day | ORAL | Status: DC
  Administered 2021-08-04: 100 mg via ORAL
  Filled 2021-08-03: qty 1

## 2021-08-03 MED ORDER — WARFARIN SODIUM 1 MG PO TABS: 1.0000 mg | ORAL_TABLET | ORAL | Status: DC

## 2021-08-03 MED ORDER — GABAPENTIN 400 MG PO CAPS
800.0000 mg | ORAL_CAPSULE | Freq: Three times a day (TID) | ORAL | Status: DC
  Administered 2021-08-03 – 2021-08-04 (×3): 800 mg via ORAL
  Filled 2021-08-03 (×3): qty 2
  Filled 2021-08-03 (×2): qty 8
  Filled 2021-08-03 (×2): qty 2

## 2021-08-03 MED ORDER — FUROSEMIDE 40 MG PO TABS
20.0000 mg | ORAL_TABLET | Freq: Every day | ORAL | Status: DC
  Administered 2021-08-03 – 2021-08-04 (×2): 20 mg via ORAL
  Filled 2021-08-03 (×2): qty 1

## 2021-08-03 MED ORDER — LORATADINE 10 MG PO TABS
10.0000 mg | ORAL_TABLET | Freq: Every day | ORAL | Status: DC
  Administered 2021-08-03 – 2021-08-04 (×2): 10 mg via ORAL
  Filled 2021-08-03 (×2): qty 1

## 2021-08-03 MED ORDER — WARFARIN - PHARMACIST DOSING INPATIENT
Freq: Every day | Status: DC
  Filled 2021-08-03: qty 1

## 2021-08-03 MED ORDER — FERROUS SULFATE 325 (65 FE) MG PO TABS
325.0000 mg | ORAL_TABLET | Freq: Every day | ORAL | Status: DC
  Filled 2021-08-03: qty 1

## 2021-08-03 MED ORDER — ENSURE ENLIVE PO LIQD
237.0000 mL | Freq: Three times a day (TID) | ORAL | Status: DC
  Administered 2021-08-03: 237 mL via ORAL

## 2021-08-03 MED ORDER — PREDNISONE 20 MG PO TABS
40.0000 mg | ORAL_TABLET | Freq: Every day | ORAL | Status: DC
  Administered 2021-08-04: 40 mg via ORAL
  Filled 2021-08-03: qty 2

## 2021-08-03 MED ORDER — CRANBERRY 125 MG PO TABS: ORAL_TABLET | Freq: Every day | ORAL | Status: DC

## 2021-08-03 MED ORDER — EZETIMIBE 10 MG PO TABS
10.0000 mg | ORAL_TABLET | Freq: Every day | ORAL | Status: DC
  Administered 2021-08-04: 10 mg via ORAL
  Filled 2021-08-03: qty 1

## 2021-08-03 MED ORDER — WARFARIN SODIUM 2 MG PO TABS
2.0000 mg | ORAL_TABLET | ORAL | Status: DC
  Administered 2021-08-03: 2 mg via ORAL
  Filled 2021-08-03: qty 1

## 2021-08-03 MED ORDER — FLUTICASONE PROPIONATE 50 MCG/ACT NA SUSP
2.0000 | Freq: Every day | NASAL | Status: DC
  Filled 2021-08-03: qty 16

## 2021-08-03 MED ORDER — AZITHROMYCIN 500 MG IV SOLR
500.0000 mg | INTRAVENOUS | Status: DC
  Administered 2021-08-03 – 2021-08-04 (×2): 500 mg via INTRAVENOUS
  Filled 2021-08-03 (×2): qty 500

## 2021-08-03 MED ORDER — SODIUM CHLORIDE 0.9 % IV SOLN
2.0000 g | INTRAVENOUS | Status: DC
  Administered 2021-08-03 – 2021-08-04 (×2): 2 g via INTRAVENOUS
  Filled 2021-08-03: qty 20

## 2021-08-03 MED ORDER — LEVOCETIRIZINE DIHYDROCHLORIDE 5 MG PO TABS: 5.0000 mg | ORAL_TABLET | Freq: Every day | ORAL | Status: DC

## 2021-08-03 MED ORDER — PANTOPRAZOLE SODIUM 20 MG PO TBEC
20.0000 mg | DELAYED_RELEASE_TABLET | Freq: Every day | ORAL | Status: DC
  Administered 2021-08-03 – 2021-08-04 (×2): 20 mg via ORAL
  Filled 2021-08-03 (×2): qty 1

## 2021-08-03 MED ORDER — ADULT MULTIVITAMIN W/MINERALS CH
1.0000 | ORAL_TABLET | Freq: Every day | ORAL | Status: DC
  Administered 2021-08-03: 1 via ORAL
  Filled 2021-08-03 (×2): qty 1

## 2021-08-03 MED ORDER — SODIUM CHLORIDE 0.9 % IV SOLN: 500.0000 mg | Freq: Once | INTRAVENOUS | Status: DC

## 2021-08-03 MED ORDER — IPRATROPIUM-ALBUTEROL 0.5-2.5 (3) MG/3ML IN SOLN
3.0000 mL | RESPIRATORY_TRACT | Status: AC
Start: 2021-08-03 — End: 2021-08-03
  Administered 2021-08-03: 3 mL via RESPIRATORY_TRACT
  Filled 2021-08-03 (×2): qty 3

## 2021-08-03 NOTE — Evaluation (Signed)
Occupational Therapy Evaluation Patient Details Name: Sarah Riddle MRN: [id number] DOB: 20-Jun-1941 Today's Date: 08/03/2021   History of Present Illness Sarah Riddle is a 80 y.o. female with medical history significant for OSA on CPAP, CAD, PAF s/p ablation 2019 on Coumadin, HTN, chronic pain, history of melanoma, allergic rhinitis on allergy shots,  who presents to the ED with a 1 week history of cough productive of green sputum associated with shortness of breath and fever as in the past few days. Pt admitted with acute respiratory failure with hypoxia and atypical pneumonia with bronchitis.   Clinical Impression   Pt was seen for OT evaluation this date. Prior to hospital admission, pt was independent, only recently using rollator for mobility. Was previous driving, grocery shopping, and also providing some limited care giving for spouse. Pt reports dtr recently moved in with them to assist as needed (dtr is a CNA). Pt lives in a 1 story home with 1 step. Currently pt demonstrates impairments in activity tolerance as described below (See OT problem list) which functionally limit her ability to perform more exertional ADL/self-care tasks. No direct assist for ADL mobility required. Pt educated in energy conservation strategies including AE/DME for ADL/IADL, home/routines modifications, activity pacing, and work simplification to support safety and independence with bathing, dressing, grocery shopping, meal prep, and housekeeping tasks. Pt verbalized understanding. Pt would benefit from additional skilled OT services while hospitalized to address noted impairments and functional limitations (see below for any additional details) in order to maximize safety and independence while minimizing falls risk and caregiver burden. Do not anticipate skilled OT needs upon discharge.    Recommendations for follow up therapy are one component of a multi-disciplinary discharge planning process, led by the  attending physician.  Recommendations may be updated based on patient status, additional functional criteria and insurance authorization.   Follow Up Recommendations  No OT follow up    Assistance Recommended at Discharge PRN  Functional Status Assessment  Patient has had a recent decline in their functional status and demonstrates the ability to make significant improvements in function in a reasonable and predictable amount of time.  Equipment Recommendations  None recommended by OT    Recommendations for Other Services       Precautions / Restrictions Precautions Precautions: Fall Precaution Comments: low fall risk Restrictions Weight Bearing Restrictions: No      Mobility Bed Mobility Overal bed mobility: Independent                  Transfers Overall transfer level: Modified independent Equipment used: Rolling walker (2 wheels)               General transfer comment: MOD I with usage of RW for standing at EOB      Balance Overall balance assessment: Mild deficits observed, not formally tested (Hx of peripheral neuropathy but denies falls)                                         ADL either performed or assessed with clinical judgement   ADL Overall ADL's : Modified independent                                       General ADL Comments: Pt able to perform basic ADL without assist, increased  effort.     Vision         Perception     Praxis      Pertinent Vitals/Pain Pain Assessment: No/denies pain     Hand Dominance     Extremity/Trunk Assessment Upper Extremity Assessment Upper Extremity Assessment: Overall WFL for tasks assessed   Lower Extremity Assessment Lower Extremity Assessment: Overall WFL for tasks assessed (hx BLE neuropathy below the knees) RLE Deficits / Details: 4+/5 MMT; Hx of neuropathy beginning below her knee RLE Sensation: history of peripheral neuropathy LLE Deficits / Details:  4+/5 MMT; Hx of neuropathy beginning below her knee LLE Sensation: history of peripheral neuropathy       Communication Communication Communication: No difficulties   Cognition Arousal/Alertness: Awake/alert Behavior During Therapy: WFL for tasks assessed/performed Overall Cognitive Status: Within Functional Limits for tasks assessed                                       General Comments       Exercises Other Exercises Other Exercises: Pt educated in energy conservation strategies including AE/DME for ADL/IADL, home/routines modifications, activity pacing, and work simplification to support safety and independence with bathing, dressing, grocery shopping, meal prep, and housekeeping tasks.   Shoulder Instructions      Home Living Family/patient expects to be discharged to:: Private residence Living Arrangements: Spouse/significant other;Children (adult daughter recently moved in with pt/spouse, dtr is a CNA) Available Help at Discharge: Family;Available PRN/intermittently Type of Home: House Home Access: Stairs to enter Entrance Stairs-Number of Steps: 1 Entrance Stairs-Rails: None Home Layout: One level     Bathroom Shower/Tub: Occupational psychologist: Handicapped height     Home Equipment: Rollator (4 wheels);Cane - quad;Shower seat;Grab bars - toilet;Grab bars - tub/shower;Adaptive equipment Adaptive Equipment: Reacher Additional Comments: Pt reports living with her spouse and her daughter. Spouse unable to provide physical assistance and does not drive      Prior Functioning/Environment Prior Level of Function : Independent/Modified Independent;Driving             Mobility Comments: Ambulates with rollator at baseline ADLs Comments: Independent with ADLs; Intermittent assist from daughter with IADLs        OT Problem List: Decreased activity tolerance;Decreased knowledge of use of DME or AE      OT Treatment/Interventions:  Self-care/ADL training;Therapeutic activities;Energy conservation;DME and/or AE instruction;Patient/family education    OT Goals(Current goals can be found in the care plan section) Acute Rehab OT Goals Patient Stated Goal: go home OT Goal Formulation: With patient Time For Goal Achievement: 08/17/21 Potential to Achieve Goals: Good ADL Goals Additional ADL Goal #1: Pt will identify at least 2 learned energy conservation strategies to support ADL/IADL participation while minimizing SOB/over exertion.  OT Frequency: Min 1X/week   Barriers to D/C:            Co-evaluation              AM-PAC OT "6 Clicks" Daily Activity     Outcome Measure Help from another person eating meals?: None Help from another person taking care of personal grooming?: None Help from another person toileting, which includes using toliet, bedpan, or urinal?: None Help from another person bathing (including washing, rinsing, drying)?: A Little Help from another person to put on and taking off regular upper body clothing?: None Help from another person to put on and taking off  regular lower body clothing?: None 6 Click Score: 23   End of Session Nurse Communication: Other (comment) (O2 sats)  Activity Tolerance: Patient tolerated treatment well Patient left: in bed;with call bell/phone within reach  OT Visit Diagnosis: Other abnormalities of gait and mobility (R26.89)                Time: 1555-1610 OT Time Calculation (min): 15 min Charges:  OT General Charges $OT Visit: 1 Visit OT Evaluation $OT Eval Low Complexity: 1 Low OT Treatments $Self Care/Home Management : 8-22 mins  Ardeth Perfect., MPH, MS, OTR/L ascom [phone] 08/03/21, 4:27 PM

## 2021-08-03 NOTE — ED Notes (Signed)
Dinner meal tray given.  

## 2021-08-03 NOTE — ED Notes (Signed)
Patient sitting on side of bed.  No complaints voiced.  Updated on plan of care

## 2021-08-03 NOTE — ED Notes (Signed)
Patient currently in CT °

## 2021-08-03 NOTE — ED Notes (Signed)
Patienton hopsital bed now and more comfortable.  Productive cough.  No distress.

## 2021-08-03 NOTE — ED Notes (Signed)
Patient noted to have SPO2 at 87-88% on room air.  Would increase for short periods of time.  Placed on 2 L [place].  MD aware

## 2021-08-03 NOTE — Assessment & Plan Note (Signed)
Due to rhinovirus infection of respiratory tract.  Respiratory panel positive for rhinovirus.  Currently on 2 L oxygen.  Wean as able.  She normally uses 2 L oxygen at home as needed only

## 2021-08-03 NOTE — Assessment & Plan Note (Signed)
Rate is controlled with metoprolol.  Warfarin for anticoagulation

## 2021-08-03 NOTE — ED Notes (Signed)
PT at bedside.

## 2021-08-03 NOTE — Assessment & Plan Note (Signed)
Warfarin dosing per pharmacy

## 2021-08-03 NOTE — Hospital Course (Signed)
80 y.o. female with medical history significant for OSA on CPAP, CAD, PAF s/p ablation 2019 on Coumadin, HTN, chronic pain, history of melanoma, allergic rhinitis on allergy shots, passive tobacco exposure who presents to the ED with a 1 week history of cough productive of green sputum associated with shortness of breath and fever as in the past few days.  She is admitted for atypical pneumonia with bronchitis and acute hypoxic respiratory failure  10/31: Checking Fungitell, Legionella, strep pneumo, histoplasma.  Wean oxygen

## 2021-08-03 NOTE — Assessment & Plan Note (Signed)
Likely viral.  Respiratory panel positive for rhinovirus

## 2021-08-03 NOTE — Assessment & Plan Note (Signed)
Likely viral in nature.  Check strep pneumo, Fungitell, Legionella, histoplasma

## 2021-08-03 NOTE — Evaluation (Signed)
Physical Therapy Evaluation Patient Details Name: Sarah Riddle MRN: [id number] DOB: June 13, 1941 Today's Date: 08/03/2021  History of Present Illness  Sarah Riddle is a 80 y.o. female with medical history significant for OSA on CPAP, CAD, PAF s/p ablation 2019 on Coumadin, HTN, chronic pain, history of melanoma, allergic rhinitis on allergy shots,  who presents to the ED with a 1 week history of cough productive of green sputum associated with shortness of breath and fever as in the past few days. Pt admitted with acute respiratory failure with hypoxia and atypical pneumonia with bronchitis.   Clinical Impression  Pt is a pleasant 80 year old female who presents to PT evaluation after admission for acute respiratory failure with hypoxia and pneumonia. Pt reports that she lives with her husband and daughter in a single level home with 1 step to enter and recently began using a rollator for household distances only due to shortness of breath and increased LE muscular fatigue. Pt able to perform bed mobility independently, transfers MOD I with RW, and ambulate with SPV + RW. Pt ambulated 60 ft on room air and maintained >90% during ambulation trial only decreaseing to 89% briefly at the end of ambulation and improved with seated rest break. Pt left seated EOB with tray table in front on 1L O2 and 96% SpO2. Pt currently demonstrating decreased muscular strength, endurance, and impaired balance. Recommending HHPT at discharge to improve deficits listed above. Pt will benefit from further skilled PT services to improve independence and balance to reduce fall risk.      Recommendations for follow up therapy are one component of a multi-disciplinary discharge planning process, led by the attending physician.  Recommendations may be updated based on patient status, additional functional criteria and insurance authorization.  Follow Up Recommendations Home health PT    Assistance Recommended at  Discharge Intermittent Supervision/Assistance  Functional Status Assessment Patient has had a recent decline in their functional status and demonstrates the ability to make significant improvements in function in a reasonable and predictable amount of time.  Equipment Recommendations  None recommended by PT    Recommendations for Other Services       Precautions / Restrictions Precautions Precautions: Fall Restrictions Weight Bearing Restrictions: No      Mobility  Bed Mobility Overal bed mobility: Independent                  Transfers Overall transfer level: Modified independent Equipment used: Rolling walker (2 wheels)               General transfer comment: MOD I with usage of RW for standing at EOB    Ambulation/Gait Ambulation/Gait assistance: Supervision Gait Distance (Feet): 60 Feet Assistive device: Rolling walker (2 wheels) Gait Pattern/deviations: Step-through pattern;Decreased stride length;Narrow base of support Gait velocity: decreased   General Gait Details: Demonstrates good understanding of AD usage; Pt with decreased step length bilaterally and reporting LE muscular fatigue at end of ambulation  Stairs            Wheelchair Mobility    Modified Rankin (Stroke Patients Only)       Balance Overall balance assessment: Mild deficits observed, not formally tested (Hx of peripheral neuropathy but denies falls)         Pertinent Vitals/Pain Pain Assessment: No/denies pain    Home Living Family/patient expects to be discharged to:: Private residence Living Arrangements: Spouse/significant other;Children Available Help at Discharge: Family;Available PRN/intermittently Type of Home: House Home Access: Stairs  to enter Entrance Stairs-Rails: None Entrance Stairs-Number of Steps: 1   Home Layout: One level Home Equipment: Rollator (4 wheels);Cane - quad;Shower seat Additional Comments: Pt reports living with her spouse and her  daughter. Spouse unable to provide physical assistance and does not drive    Prior Function Prior Level of Function : Independent/Modified Independent;Driving             Mobility Comments: Ambulates with rollator at baseline ADLs Comments: Independent with ADLs; Intermittent assist from daughter with IADLs     Hand Dominance        Extremity/Trunk Assessment        Lower Extremity Assessment Lower Extremity Assessment: Overall WFL for tasks assessed;RLE deficits/detail;LLE deficits/detail RLE Deficits / Details: 4+/5 MMT; Hx of neuropathy beginning below her knee RLE Sensation: history of peripheral neuropathy LLE Deficits / Details: 4+/5 MMT; Hx of neuropathy beginning below her knee LLE Sensation: history of peripheral neuropathy       Communication   Communication: No difficulties  Cognition Arousal/Alertness: Awake/alert Behavior During Therapy: WFL for tasks assessed/performed Overall Cognitive Status: Within Functional Limits for tasks assessed      General Comments      Exercises     Assessment/Plan    PT Assessment Patient needs continued PT services  PT Problem List Decreased strength;Decreased activity tolerance;Decreased balance;Decreased mobility;Decreased coordination;Impaired sensation       PT Treatment Interventions DME instruction;Gait training;Stair training;Functional mobility training;Therapeutic activities;Therapeutic exercise;Neuromuscular re-education;Balance training;Patient/family education;Manual techniques;Modalities    PT Goals (Current goals can be found in the Care Plan section)  Acute Rehab PT Goals Patient Stated Goal: to get stronger and go home PT Goal Formulation: With patient Time For Goal Achievement: 08/17/21 Potential to Achieve Goals: Good    Frequency Min 2X/week   Barriers to discharge        Co-evaluation               AM-PAC PT "6 Clicks" Mobility  Outcome Measure Help needed turning from your back  to your side while in a flat bed without using bedrails?: None Help needed moving from lying on your back to sitting on the side of a flat bed without using bedrails?: None Help needed moving to and from a bed to a chair (including a wheelchair)?: A Little Help needed standing up from a chair using your arms (e.g., wheelchair or bedside chair)?: A Little Help needed to walk in hospital room?: A Little Help needed climbing 3-5 steps with a railing? : A Lot 6 Click Score: 19    End of Session Equipment Utilized During Treatment: Gait belt Activity Tolerance: Patient limited by fatigue;Patient tolerated treatment well Patient left: in bed;with call bell/phone within reach Nurse Communication: Mobility status PT Visit Diagnosis: Unsteadiness on feet (R26.81);Muscle weakness (generalized) (M62.81)    Time: 2778-2423 PT Time Calculation (min) (ACUTE ONLY): 17 min   Charges:   PT Evaluation $PT Eval Low Complexity: 1 Low          Andrey Campanile, SPT   Andrey Campanile 08/03/2021, 3:16 PM

## 2021-08-03 NOTE — Progress Notes (Signed)
  Progress Note    LITTLE BASHORE   ZYY:[id number]  DOB: 26-Jun-1941  DOA: 08/02/2021     0 Date of Service: 08/03/2021   Clinical Course   80 y.o. female with medical history significant for OSA on CPAP, CAD, PAF s/p ablation 2019 on Coumadin, HTN, chronic pain, history of melanoma, allergic rhinitis on allergy shots, passive tobacco exposure who presents to the ED with a 1 week history of cough productive of green sputum associated with shortness of breath and fever as in the past few days.  She is admitted for atypical pneumonia with bronchitis and acute hypoxic respiratory failure  10/31: Checking Fungitell, Legionella, strep pneumo, histoplasma.  Wean oxygen   Assessment and Plan * Acute respiratory failure with hypoxia (HCC) Due to rhinovirus infection of respiratory tract.  Respiratory panel positive for rhinovirus.  Currently on 2 L oxygen.  Wean as able.  She normally uses 2 L oxygen at home as needed only  Atypical pneumonia Likely viral in nature.  Check strep pneumo, Fungitell, Legionella, histoplasma  Acute bronchitis Likely viral.  Respiratory panel positive for rhinovirus  Chronic anticoagulation (Coumadin) Warfarin dosing per pharmacy  Chronic pain syndrome Continue amitriptyline and gabapentin  PAF (paroxysmal atrial fibrillation) (HCC) Rate is controlled with metoprolol.  Warfarin for anticoagulation  Hypertension Restart home blood pressure medicine including metoprolol, Cozaar and Imdur     Subjective:  Feeling much better than before although still somewhat short of breath, hypoxic and blood pressure is running high  Objective Vitals:   08/03/21 0600 08/03/21 0909 08/03/21 1237 08/03/21 1339  BP: (!) 156/63 (!) 143/99 (!) 160/82 (!) 169/87  Pulse: 82 88 87 92  Resp: 20 (!) 21 15 18   Temp:      TempSrc:      SpO2: 98% 99% 100% 99%  Weight:      Height:       105.2 kg  Vital signs were reviewed and unremarkable except for: Blood pressure:  Elevated    Exam Physical Exam   Constitutional:Alert and oriented x 3 . Not in any apparent distress HEENT: Head:Normocephalic and atraumatic.  Eyes:PERLA, EOMI, Conjunctivae are normal. Sclera is non-icteric.  Mouth/Throat:Mucous membranes are moist.  Neck:Supple with no signs of meningismus. Cardiovascular:Regular rate and rhythm. No murmurs, gallops, or rubs. 2+ symmetrical distal pulses are present . No JVD. No  LE edema Respiratory:Respiratory effort increased, no tachypnea.Lungs sounds with scattered wheezes Gastrointestinal:Soft, non tender, non distended. Positive bowel sounds.  Genitourinary:No CVA tenderness. Musculoskeletal:Nontender with normal range of motion in all extremities. No cyanosis, or erythema of extremities. Neurologic:  Face is symmetric. Moving all extremities. No gross focal neurologic deficits . Skin:Skin is warm, dry.  No rash or ulcers Psychiatric: Mood and affect are appropriate   Labs / Other Information My review of labs, imaging, notes and other tests shows no new significant findings.    Disposition Plan: Status is: Inpatient  Remains inpatient appropriate because: Treatment of respiratory infection and weaning oxygen as she is still hypoxic.   Possible discharge in next 1 to 2 days depending on clinical condition     Time spent: 35 minutes [facility] 08/03/2021, 2:33 PM

## 2021-08-03 NOTE — ED Notes (Signed)
Lunch meal tray given.  

## 2021-08-03 NOTE — Assessment & Plan Note (Signed)
Restart home blood pressure medicine including metoprolol, Cozaar and Imdur

## 2021-08-03 NOTE — H&P (Signed)
History and Physical    Sarah Riddle SWF:[id number] DOB: 02/13/1941 DOA: 08/02/2021  PCP: Perrin Maltese, MD   Patient coming from: home  I have personally briefly reviewed patient's relevant medical records in [place]  Chief Complaint: cough and shortness of breath  HPI: Sarah Riddle is a 80 y.o. female with medical history significant for OSA on CPAP, CAD, PAF s/p ablation 2019 on Coumadin, HTN, chronic pain, history of melanoma, allergic rhinitis on allergy shots, passive tobacco exposure who presents to the ED with a 1 week history of cough productive of green sputum associated with shortness of breath and fever as in the past few days.  She denies chest pain.  She denies affected contacts.  She has had no nausea, vomiting, abdominal pain or diarrhea.  ED course: Vitals notable for T-max of 99.3, as well as tachypnea up to 28, tachycardia of 103 and O2 sat as low as 87% on room air.  Blood pressure was on the high side with max of 175/75 Blood work with normal WBC and normal lactic acid of 1.1 and troponin 16-16.  Overall unremarkable COVID and flu negative EKG, personally viewed and interpreted: NSR at 83 with no acute ST-T wave changes  Imaging: CT chest without contrast with faint subpleural groundglass opacities bilaterally suggesting atypical infection, including COVID.  Superimposed mild chronic interstitial lung disease with lower lobe predominant bronchiectasis  Patient started on Rocephin and azithromycin.  O2 sat at 100 on 2 L.  Hospitalist consulted for admission.    Review of Systems: As per HPI otherwise all other systems on review of systems negative.    Past Medical History:  Diagnosis Date   A-fib [facility])    Age related osteoporosis    Cancer ([place])    Melanoma x 2 -- Lt Leg -1976 Rt Arm - 2014   Chronic bilateral low back pain    Chronic kidney disease    Fibrocystic breast disease    GERD (gastroesophageal reflux disease)    Hyperlipidemia     Hypertension    IBS (irritable bowel syndrome)    Lumbago    Neuropathy    Obstructive sleep apnea    Osteoarthritis of shoulder     Past Surgical History:  Procedure Laterality Date   ABDOMINAL HYSTERECTOMY     BACK SURGERY     BREAST CYST ASPIRATION Left 2001   Negative   CARDIAC ELECTROPHYSIOLOGY MAPPING AND ABLATION     EYE SURGERY     TOTAL HIP ARTHROPLASTY Left      reports that she has never smoked. She has never used smokeless tobacco. She reports that she does not drink alcohol and does not use drugs.  Allergies  Allergen Reactions   Iodinated Diagnostic Agents Shortness Of Breath    Other reaction(s): Unknown   Ibuprofen Other (See Comments)    "stomach problems"    Lubiprostone Other (See Comments)    unknown    Sulfa Antibiotics Other (See Comments)    "Stomach problems"    Zolpidem Other (See Comments)    "hallucinations"  Other reaction(s): Confusion   Boniva [Ibandronic Acid]    Celecoxib Other (See Comments)    GI DISTRESS GI upset    Duloxetine Other (See Comments)   Tetracyclines & Related Nausea Only and Nausea And Vomiting    Family History  Problem Relation Age of Onset   Colon cancer Mother    Prostate cancer Father    Cancer Sister  Acute myelogenous leukemia Sister    Prostate cancer Brother    Prostate cancer Brother    Breast cancer Neg Hx       Prior to Admission medications   Medication Sig Start Date End Date Taking? Authorizing Provider  acetaminophen (TYLENOL) 500 MG tablet Take 500 mg by mouth 2 (two) times a day.    [provider]  alendronate (FOSAMAX) 70 MG tablet Take 70 mg by mouth once a week. Take with a full glass of water on an empty stomach.    [provider]  allopurinol (ZYLOPRIM) 100 MG tablet Take 100 mg by mouth daily. 06/22/17   [provider]  amitriptyline (ELAVIL) 25 MG tablet Take 25 mg by mouth at bedtime.    [provider]  atorvastatin (LIPITOR) 40 MG  tablet Take 40 mg by mouth daily. 07/14/17   [provider]  Cranberry 125 MG TABS Take by mouth daily.    [provider]  Cyanocobalamin (VITAMIN B 12 PO) Take 1,000 mg by mouth daily. Monday - Friday    [provider]  EPINEPHrine (EPIPEN IJ) Inject as directed. Patient not taking: No sig reported    [provider]  ergocalciferol (VITAMIN D2) 1.25 MG (50000 UT) capsule Take 50,000 Units by mouth once a week.    [provider]  ezetimibe (ZETIA) 10 MG tablet Take 10 mg by mouth daily. 08/01/17   [provider]  ferrous sulfate 325 (65 FE) MG tablet Take 325 mg by mouth daily with breakfast.    [provider]  fluticasone (FLONASE) 50 MCG/ACT nasal spray Place 2 sprays into both nostrils daily. 07/31/17   [provider]  furosemide (LASIX) 20 MG tablet Take 1 tablet by mouth daily. 07/13/17   [provider]  gabapentin (NEURONTIN) 800 MG tablet Take 1 tablet (800 mg total) by mouth in the morning, at noon, in the evening, and at bedtime. 03/10/20 06/02/21  Milinda Pointer, MD  isosorbide mononitrate (IMDUR) 60 MG 24 hr tablet Take 60 mg by mouth daily.    [provider]  levocetirizine (XYZAL) 5 MG tablet Take 5 mg by mouth daily. 07/31/17   [provider]  losartan (COZAAR) 50 MG tablet Take 50 mg by mouth daily. 08/23/17   [provider]  metoprolol succinate (TOPROL-XL) 25 MG 24 hr tablet Take 25 mg by mouth daily.    [provider]  nitroGLYCERIN (NITROSTAT) 0.4 MG SL tablet Place 0.4 mg under the tongue every 5 (five) minutes as needed for chest pain. Patient not taking: No sig reported    [provider]  OVER THE COUNTER MEDICATION Weekly allergies injection but pt uncertain what medication is given and dose.    [provider]  pantoprazole (PROTONIX) 40 MG tablet Take 20 mg by mouth daily.  08/22/17   [provider]  rosuvastatin  (CRESTOR) 40 MG tablet Take 40 mg by mouth daily.    [provider]  sertraline (ZOLOFT) 25 MG tablet Take 25 mg by mouth 3 (three) times daily.     [provider]  warfarin (COUMADIN) 2 MG tablet Take 2 mg by mouth daily. 1mg  - sat, mon, wed 2mg - sun, tues, thurs, fri 06/24/17   [provider]    Physical Exam: Vitals:   08/02/21 1707 08/02/21 1711 08/03/21 0115 08/03/21 0130  BP:  (!) 166/78  (!) 156/82  Pulse: 81  89 85  Resp: 18  18 20  Temp:  99.3 F (37.4 C)    TempSrc:  Oral    SpO2: 93%  (!) 87% 100%  Weight: 105.2 kg     Height: 5\' 11"  (1.803 m)      Constitutional: Alert and oriented x 3 . Not in any apparent distress HEENT:      Head: Normocephalic and atraumatic.         Eyes: PERLA, EOMI, Conjunctivae are normal. Sclera is non-icteric.       Mouth/Throat: Mucous membranes are moist.       Neck: Supple with no signs of meningismus. Cardiovascular: Regular rate and rhythm. No murmurs, gallops, or rubs. 2+ symmetrical distal pulses are present . No JVD. No  LE edema Respiratory: Respiratory effort increased, no tachypnea.Lungs sounds with scattered wheezes Gastrointestinal: Soft, non tender, non distended. Positive bowel sounds.  Genitourinary: No CVA tenderness. Musculoskeletal: Nontender with normal range of motion in all extremities. No cyanosis, or erythema of extremities. Neurologic:  Face is symmetric. Moving all extremities. No gross focal neurologic deficits . Skin: Skin is warm, dry.  No rash or ulcers Psychiatric: Mood and affect are appropriate    Labs on Admission: I have personally reviewed following labs and imaging studies  CBC: Recent Labs  Lab 08/02/21 1712  WBC 8.3  NEUTROABS 5.4  HGB 12.4  HCT 38.1  MCV 93.2  PLT 277   Basic Metabolic Panel: Recent Labs  Lab 08/02/21 1712  NA 140  K 3.9  CL 104  CO2 29  GLUCOSE 115*  BUN 19  CREATININE 0.95  CALCIUM 9.3   GFR: Estimated Creatinine Clearance: 62  mL/min (by C-G formula based on SCr of 0.95 mg/dL). Liver Function Tests: Recent Labs  Lab 08/02/21 1712  AST 20  ALT 15  ALKPHOS 62  BILITOT 0.8  PROT 7.0  ALBUMIN 3.8   No results for input(s): LIPASE, AMYLASE in the last 168 hours. No results for input(s): AMMONIA in the last 168 hours. Coagulation Profile: No results for input(s): INR, PROTIME in the last 168 hours. Cardiac Enzymes: No results for input(s): CKTOTAL, CKMB, CKMBINDEX, TROPONINI in the last 168 hours. BNP (last 3 results) No results for input(s): PROBNP in the last 8760 hours. HbA1C: No results for input(s): HGBA1C in the last 72 hours. CBG: No results for input(s): GLUCAP in the last 168 hours. Lipid Profile: No results for input(s): CHOL, HDL, LDLCALC, TRIG, CHOLHDL, LDLDIRECT in the last 72 hours. Thyroid Function Tests: No results for input(s): TSH, T4TOTAL, FREET4, T3FREE, THYROIDAB in the last 72 hours. Anemia Panel: No results for input(s): VITAMINB12, FOLATE, FERRITIN, TIBC, IRON, RETICCTPCT in the last 72 hours. Urine analysis:    Component Value Date/Time   COLORURINE YELLOW (A) 08/25/2017 2347   APPEARANCEUR HAZY (A) 08/25/2017 2347   LABSPEC 1.014 08/25/2017 2347   PHURINE 7.0 08/25/2017 2347   GLUCOSEU NEGATIVE 08/25/2017 2347   HGBUR SMALL (A) 08/25/2017 2347   BILIRUBINUR NEGATIVE 08/25/2017 2347   KETONESUR NEGATIVE 08/25/2017 2347   PROTEINUR NEGATIVE 08/25/2017 2347   NITRITE NEGATIVE 08/25/2017 2347   LEUKOCYTESUR LARGE (A) 08/25/2017 2347    Radiological Exams on Admission: DG Chest 2 View  Result Date: 08/02/2021 CLINICAL DATA:  Cough and fever.  Shortness of breath. EXAM: CHEST - 2 VIEW COMPARISON:  08/25/2017 FINDINGS: Stable heart size. Unchanged mediastinal contours. Aortic atherosclerosis. No focal airspace disease or evidence of pneumonia. No pulmonary edema, pleural effusion, or pneumothorax. Chronic arthropathy of both shoulders. IMPRESSION: No acute chest findings.  Aortic Atherosclerosis (ICD10-I70.0). Electronically Signed   By: Keith Rake M.D.   On: 08/02/2021 17:36   CT CHEST WO CONTRAST  Result Date: 08/03/2021 CLINICAL DATA:  Cough, chronic dyspnea EXAM: CT CHEST WITHOUT CONTRAST TECHNIQUE: Multidetector CT imaging of the chest was performed following the standard protocol without IV contrast. COMPARISON:  Chest radiographs dated 08/02/2021 FINDINGS: Cardiovascular: Heart is top-normal in size. No pericardial effusion. No evidence thoracic aortic aneurysm. Atherosclerotic calcifications of the aortic arch. Coronary atherosclerosis of the LAD and left circumflex. Mediastinum/Nodes: No suspicious mediastinal lymphadenopathy. Visualized thyroid is unremarkable. Lungs/Pleura: Faint subpleural ground-glass opacities in the lungs bilaterally, for example in the left upper lobe (series 3/image 42). This appearance is nonspecific but raises the possibility of atypical infection, including COVID. Mild subpleural reticulation/fibrosis, suggesting chronic lung disease. Lower lobe predominant bronchiectasis. Mild subpleural nodularity, including a dominant 5 x 7 mm nodule in the left lower lobe (series 3/image 111). Linear scarring/atelectasis in the right middle lobe. No pleural effusion or pneumothorax. Upper Abdomen: Visualized upper abdomen is grossly unremarkable, noting vascular calcifications. Musculoskeletal: Degenerative changes of the visualized thoracolumbar spine. IMPRESSION: Faint subpleural ground-glass opacities in the lungs bilaterally, suggesting atypical infection, including COVID. Superimposed mild chronic interstitial lung disease with lower lobe predominant bronchiectasis. Subpleural nodularity, including a dominant 6 mm nodule (mean diameter) in the left lower lobe, likely benign. Non-contrast chest CT at 6-12 months is recommended. If the nodule is stable at time of repeat CT, then future CT at 18-24 months (from today's scan) is considered optional  for low-risk patients, but is recommended for high-risk patients. This recommendation follows the consensus statement: Guidelines for Management of Incidental Pulmonary Nodules Detected on CT Images: From the Fleischner Society 2017; Radiology 2017; 284:228-243. Aortic Atherosclerosis (ICD10-I70.0). Electronically Signed   By: Julian Hy M.D.   On: 08/03/2021 00:58    Assessment/Plan    Atypical pneumonia with bronchitis   Acute respiratory failure with hypoxia  - Patient with 1 week history of productive phlegm, shortness of breath, wheezing and low-grade fever.  O2 sats 87% on room air with increased work of breathing - COVID and flu negative, WBC normal - CT chest without contrast suggesting atypical bilateral pneumonia with underlying bronchiectasis - Empiric's Rocephin and azithromycin - DuoNebs as needed and antitussives - Supplemental oxygen - We will get respiratory viral panel  Allergic rhinitis Multiple medication allergies - Continue Flonase and Xyzal - Patient gets allergy shots EpiPen - Monitor for allergic reactions     PAF s/p ablation 2019   Chronic anticoagulation (Coumadin) -Currently in sinus - Continue metoprolol - Pharmacy to manage warfarin  CAD - No complaints of chest pain, troponins negative and EKG nonacute - Continue atorvastatin, Imdur, metoprolol, sublingual NTG  OSA on CPAP - CPAP nightly    Hypertension - Continue losartan, metoprolol    Chronic musculoskeletal pain  - Continue amitriptyline, Neurontin pending verification    DVT prophylaxis: Lovenox  Code Status: full code  Family Communication:  none  Disposition Plan: Back to previous home environment Consults called: none  Status:At the time of admission, it appears that the appropriate admission status for this patient is INPATIENT. This is judged to be reasonable and necessary in order to provide the required intensity of service to ensure the patient's safety given the  presenting symptoms, physical exam findings, and initial radiographic and laboratory data in the context of their  Comorbid conditions.   Patient requires inpatient status due to high intensity of service, high risk for  further deterioration and high frequency of surveillance required.   I certify that at the point of admission it is my clinical judgment that the patient will require inpatient hospital care spanning beyond [place] MD [facility]   08/03/2021, 2:18 AM

## 2021-08-03 NOTE — ED Notes (Addendum)
Pt O2 tank replaced and assisted to bathroom

## 2021-08-03 NOTE — Assessment & Plan Note (Signed)
Continue amitriptyline and gabapentin. 

## 2021-08-03 NOTE — Progress Notes (Signed)
ANTICOAGULATION CONSULT NOTE - Initial Consult  Pharmacy Consult for Warfarin  Indication: atrial fibrillation  Allergies  Allergen Reactions   Iodinated Diagnostic Agents Shortness Of Breath    Other reaction(s): Unknown   Ibuprofen Other (See Comments)    "stomach problems"    Lubiprostone Other (See Comments)    unknown    Sulfa Antibiotics Other (See Comments)    "Stomach problems"    Zolpidem Other (See Comments)    "hallucinations"  Other reaction(s): Confusion   Boniva [Ibandronic Acid]    Celecoxib Other (See Comments)    GI DISTRESS GI upset    Duloxetine Other (See Comments)   Tetracyclines & Related Nausea Only and Nausea And Vomiting    Patient Measurements: Height: 5\' 11"  (180.3 cm) Weight: 105.2 kg (232 lb) IBW/kg (Calculated) : 70.8 Heparin Dosing Weight:   Vital Signs: Temp: 99.3 F (37.4 C) (10/30 1711) Temp Source: Oral (10/30 1711) BP: 164/76 (10/31 0230) Pulse Rate: 83 (10/31 0230)  Labs: Recent Labs    08/02/21 1712 08/02/21 2348 08/03/21 0130  HGB 12.4  --   --   HCT 38.1  --   --   PLT 200  --   --   LABPROT  --   --  25.0*  INR  --   --  2.3*  CREATININE 0.95  --   --   TROPONINIHS 16 16  --     Estimated Creatinine Clearance: 62 mL/min (by C-G formula based on SCr of 0.95 mg/dL).   Medical History: Past Medical History:  Diagnosis Date   A-fib [facility])    Age related osteoporosis    Cancer ([place])    Melanoma x 2 -- Lt Leg -1976 Rt Arm - 2014   Chronic bilateral low back pain    Chronic kidney disease    Fibrocystic breast disease    GERD (gastroesophageal reflux disease)    Hyperlipidemia    Hypertension    IBS (irritable bowel syndrome)    Lumbago    Neuropathy    Obstructive sleep apnea    Osteoarthritis of shoulder     Medications:  (Not in a hospital admission)   Assessment: Pharmacy consulted to dose warfarin in this 80 year old female admitted with CAP, Afib, on warfarin at home.   PTA : Warfarin 2 mg PO  Sun, Tues, Thurs and Fri            Warfarin 1 mg PO Sat, Mon and Wed  - last dose of warfarin was on Sat 10/29; Pt did not take Sun 10/30 dose.   INR 10/31 @ 0130 = 2.3 (therapeutic)   Goal of Therapy:  INR 2-3 Monitor platelets by anticoagulation protocol: Yes   Plan:  Will give warfarin 2 mg PO X 1 on 10/31 @ ~ 0330 since pt missed Sun dose. Will resume home warfarin dosing on 10/31 @ 1800:    Warfarin 2 mg PO Sun, Tues, Thurs and Fri    Warfarin 1 mg PO Sat, Mon and Wed Will check INR daily since pt is on abx for CAP.   Gorje Iyer D 08/03/2021,3:45 AM

## 2021-08-03 NOTE — Progress Notes (Signed)
Initial Nutrition Assessment  DOCUMENTATION CODES:   Obesity unspecified  INTERVENTION:   -Ensure Enlive po BID, each supplement provides 350 kcal and 20 grams of protein  -MVI with minerals daily -Liberalize diet to regular for widest variety of food selections  NUTRITION DIAGNOSIS:   Increased nutrient needs related to acute illness as evidenced by estimated needs.  GOAL:   Patient will meet greater than or equal to 90% of their needs  MONITOR:   PO intake, Supplement acceptance, Labs, Weight trends, Skin, I & O's  REASON FOR ASSESSMENT:   Consult Assessment of nutrition requirement/status  ASSESSMENT:   Sarah Riddle is a 80 y.o. female with medical history significant for OSA on CPAP, CAD, PAF s/p ablation 2019 on Coumadin, HTN, chronic pain, history of melanoma, allergic rhinitis on allergy shots, passive tobacco exposure who presents to the ED with a 1 week history of cough productive of green sputum associated with shortness of breath and fever as in the past few days.  She denies chest pain.  She denies affected contacts.  She has had no nausea, vomiting, abdominal pain or diarrhea.  Pt admitted with acute respiratory failure with hypoxia and atypical pneumonia with bronchitis.   Reviewed I/O's: +100 ml x 24 hours  Per H&P, pt with 1 week history of productive phlegm, SOB, wheezing, and low grade fever.   Pt unavailable at time of visit. RD unable to obtain further nutrition-related history or complete nutrition-focused physical exam at this time.    Pt currently on a heart healthy diet. No meal completion data available to assess at this time.   Reviewed wt hx; pt has experienced a 2.4% wt loss over the past 2 months, which is not significant for time frame.   Medications reviewed and include prednisone.   Labs reviewed.  Diet Order:   Diet Order             Diet Heart Room service appropriate? Yes; Fluid consistency: Thin  Diet effective now                    EDUCATION NEEDS:   No education needs have been identified at this time  Skin:  Skin Assessment: Reviewed RN Assessment  Last BM:  Unknown  Height:   Ht Readings from Last 1 Encounters:  08/02/21 5\' 11"  (1.803 m)    Weight:   Wt Readings from Last 1 Encounters:  08/02/21 105.2 kg    Ideal Body Weight:  70.5 kg  BMI:  Body mass index is 32.36 kg/m.  Estimated Nutritional Needs:   Kcal:  1900-2100  Protein:  90-105 grams  Fluid:  > 1.9 L    Loistine Chance, RD, LDN, [place] Registered Dietitian II Certified Diabetes Care and Education Specialist Please refer to [facility] for RD and/or RD on-call/weekend/after hours pager

## 2021-08-03 NOTE — ED Provider Notes (Signed)
[facility] Emergency Department Provider Note  ____________________________________________   Event Date/Time   First MD Initiated Contact with Patient 08/02/21 2336     (approximate)  I have reviewed the triage vital signs and the nursing notes.   HISTORY  Chief Complaint Cough and Shortness of Breath    HPI Sarah Riddle is a 79 y.o. female with history of atrial fibrillation on Coumadin, hypertension, hyperlipidemia, chronic kidney disease who presents to the emergency department with complaints of 2 days of fevers, cough, wheezing, nausea and vomiting.  No previous history of tobacco use, asthma or COPD.  Was seen at urgent care today and given azithromycin and amoxicillin as well as an albuterol inhaler.  Oxygen saturation with ambulation was 88% so they recommended that she come to the emergency department.  She does not wear oxygen chronically.  She denies any known sick contacts.  No chest pain, abdominal pain, diarrhea.  She has had a flu vaccine this year.  No history of PE or DVT.  No lower extremity swelling or pain.        Past Medical History:  Diagnosis Date   A-fib [facility])    Age related osteoporosis    Cancer ([place])    Melanoma x 2 -- Lt Leg -1976 Rt Arm - 2014   Chronic bilateral low back pain    Chronic kidney disease    Fibrocystic breast disease    GERD (gastroesophageal reflux disease)    Hyperlipidemia    Hypertension    IBS (irritable bowel syndrome)    Lumbago    Neuropathy    Obstructive sleep apnea    Osteoarthritis of shoulder     Patient Active Problem List   Diagnosis Date Noted   History of melanoma 05/20/2021   Family history of cancer 05/20/2021   Normocytic anemia 05/19/2021   History of allergy to radiographic contrast media 03/20/2020   Abnormal MRI, cervical spine (09/24/2019) 03/10/2020   Radicular pain of shoulder (Left) 03/10/2020   Cervical foraminal stenosis (Bilateral) (C3-4, C4-5, C5-6)  09/10/2019   Cervical grade 1 Anterolisthesis of C7/T1 09/10/2019   Cervical grade 1 Retrolisthesis of C4/C5 09/10/2019   Cervical facet hypertrophy 09/10/2019   Sensory peripheral neuropathy 02/15/2019   Neurogenic pain 02/15/2019   Chronic kidney disease 01/24/2019   Fibrocystic breast disease 01/24/2019   GERD (gastroesophageal reflux disease) 01/24/2019   Gout 01/24/2019   Hyperlipidemia 01/24/2019   Irritable bowel syndrome 01/24/2019   Migraine headache 01/24/2019   Osteoporosis 01/24/2019   Chronic pain syndrome 01/24/2019   Chronic feet pain (Primary area of Pain) (Bilateral) (L>R) 01/24/2019   Chronic neck pain (Fourth Area of Pain) (Right) 01/24/2019   Cervicalgia (Right) 01/24/2019   Chronic shoulder pain (Fifth Area of Pain) (Left) 01/24/2019   Chronic knee pain (Sixth Area of Pain) (Left) 01/24/2019   Chronic anticoagulation (Coumadin) 01/24/2019   Disorder of skeletal system 01/24/2019   Pharmacologic therapy 01/24/2019   Problems influencing health status 01/24/2019   DDD (degenerative disc disease), cervical 01/24/2019   DDD (degenerative disc disease), lumbar 01/24/2019   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   Numbness and tingling of feet (Bilateral) 11/28/2018   PAF (paroxysmal atrial fibrillation) ([place]) 03/05/2018   Nontraumatic complete tear of rotator cuff (Left) 01/13/2018   Osteoarthritis of shoulder (Left) 01/13/2018   Rotator cuff tendinitis, left 01/13/2018   Osteoarthritis of knee (Left) 02/21/2017   Age-related osteoporosis without current pathological fracture 01/15/2016   Allergic rhinitis 01/15/2016  Benign essential hypertension 01/15/2016   Obstructive sleep apnea (adult) (pediatric) 01/15/2016   Pain in both lower extremities 10/29/2015   Chronic tension-type headache, not intractable 07/18/2014   Difficulty walking 07/18/2014   Mild obesity 07/18/2014   Neuropathy 07/18/2014   Chronic pain of lower extremity (Bilateral) 03/13/2014    Cervicogenic headache (Tertiary Area of Pain) (Right) 03/13/2014   Numbness and tingling 03/13/2014   Chronic low back pain (Secondary Area of Pain) (Bilateral) (R>L) without sciatica 01/08/2014   Osteoarthrosis, unspecified whether generalized or localized, pelvic region and thigh 01/08/2014   Melanoma of upper arm ([place]) 02/21/2013    Past Surgical History:  Procedure Laterality Date   ABDOMINAL HYSTERECTOMY     BACK SURGERY     BREAST CYST ASPIRATION Left 2001   Negative   CARDIAC ELECTROPHYSIOLOGY MAPPING AND ABLATION     EYE SURGERY     TOTAL HIP ARTHROPLASTY Left     Prior to Admission medications   Medication Sig Start Date End Date Taking? Authorizing Provider  acetaminophen (TYLENOL) 500 MG tablet Take 500 mg by mouth 2 (two) times a day.    [provider]  alendronate (FOSAMAX) 70 MG tablet Take 70 mg by mouth once a week. Take with a full glass of water on an empty stomach.    [provider]  allopurinol (ZYLOPRIM) 100 MG tablet Take 100 mg by mouth daily. 06/22/17   [provider]  amitriptyline (ELAVIL) 25 MG tablet Take 25 mg by mouth at bedtime.    [provider]  atorvastatin (LIPITOR) 40 MG tablet Take 40 mg by mouth daily. 07/14/17   [provider]  Cranberry 125 MG TABS Take by mouth daily.    [provider]  Cyanocobalamin (VITAMIN B 12 PO) Take 1,000 mg by mouth daily. Monday - Friday    [provider]  EPINEPHrine (EPIPEN IJ) Inject as directed. Patient not taking: No sig reported    [provider]  ergocalciferol (VITAMIN D2) 1.25 MG (50000 UT) capsule Take 50,000 Units by mouth once a week.    [provider]  ezetimibe (ZETIA) 10 MG tablet Take 10 mg by mouth daily. 08/01/17   [provider]  ferrous sulfate 325 (65 FE) MG tablet Take 325 mg by mouth daily with breakfast.    [provider]  fluticasone (FLONASE) 50 MCG/ACT nasal spray Place 2 sprays into  both nostrils daily. 07/31/17   [provider]  furosemide (LASIX) 20 MG tablet Take 1 tablet by mouth daily. 07/13/17   [provider]  gabapentin (NEURONTIN) 800 MG tablet Take 1 tablet (800 mg total) by mouth in the morning, at noon, in the evening, and at bedtime. 03/10/20 06/02/21  Milinda Pointer, MD  isosorbide mononitrate (IMDUR) 60 MG 24 hr tablet Take 60 mg by mouth daily.    [provider]  levocetirizine (XYZAL) 5 MG tablet Take 5 mg by mouth daily. 07/31/17   [provider]  losartan (COZAAR) 50 MG tablet Take 50 mg by mouth daily. 08/23/17   [provider]  metoprolol succinate (TOPROL-XL) 25 MG 24 hr tablet Take 25 mg by mouth daily.    [provider]  nitroGLYCERIN (NITROSTAT) 0.4 MG SL tablet Place 0.4 mg under the tongue every 5 (five) minutes as needed for chest pain. Patient not taking: No sig reported    [provider]  OVER THE COUNTER MEDICATION Weekly allergies injection but pt uncertain what medication is given and  dose.    [provider]  pantoprazole (PROTONIX) 40 MG tablet Take 20 mg by mouth daily.  08/22/17   [provider]  rosuvastatin (CRESTOR) 40 MG tablet Take 40 mg by mouth daily.    [provider]  sertraline (ZOLOFT) 25 MG tablet Take 25 mg by mouth 3 (three) times daily.     [provider]  warfarin (COUMADIN) 2 MG tablet Take 2 mg by mouth daily. 1mg  - sat, mon, wed 2mg - sun, tues, thurs, fri 06/24/17   [provider]    Allergies Iodinated diagnostic agents, Ibuprofen, Lubiprostone, Sulfa antibiotics, Zolpidem, Boniva [ibandronic acid], Celecoxib, Duloxetine, and Tetracyclines & related  Family History  Problem Relation Age of Onset   Colon cancer Mother    Prostate cancer Father    Cancer Sister    Acute myelogenous leukemia Sister    Prostate cancer Brother    Prostate cancer Brother    Breast cancer Neg Hx     Social  History Social History   Tobacco Use   Smoking status: Never   Smokeless tobacco: Never  Substance Use Topics   Alcohol use: No   Drug use: No    Review of Systems Constitutional: + fever. Eyes: No visual changes. ENT: No sore throat. Cardiovascular: Denies chest pain. Respiratory: + shortness of breath. Gastrointestinal: + nausea, vomiting.  No diarrhea. Genitourinary: Negative for dysuria. Musculoskeletal: Negative for back pain. Skin: Negative for rash. Neurological: Negative for focal weakness or numbness.  ____________________________________________   PHYSICAL EXAM:  VITAL SIGNS: ED Triage Vitals  Enc Vitals Group     BP 08/02/21 1711 (!) 166/78     Pulse Rate 08/02/21 1707 81     Resp 08/02/21 1707 18     Temp 08/02/21 1711 99.3 F (37.4 C)     Temp Source 08/02/21 1711 Oral     SpO2 08/02/21 1706 90 %     Weight 08/02/21 1707 232 lb (105.2 kg)     Height 08/02/21 1707 5\' 11"  (1.803 m)     Head Circumference --      Peak Flow --      Pain Score 08/02/21 1707 0     Pain Loc --      Pain Edu? --      Excl. in [place]? --    CONSTITUTIONAL: Alert and oriented and responds appropriately to questions. Well-appearing; well-nourished, elderly, nontoxic HEAD: Normocephalic EYES: Conjunctivae clear, pupils appear equal, EOM appear intact ENT: normal nose; moist mucous membranes NECK: Supple, normal ROM CARD: RRR; S1 and S2 appreciated; no murmurs, no clicks, no rubs, no gallops RESP: Normal chest excursion without splinting or tachypnea; breath sounds equal and moderate but she does have diffuse expiratory wheezes and rhonchi, speaking full sentences, sats 92% on room air at rest, no respiratory distress ABD/GI: Normal bowel sounds; non-distended; soft, non-tender, no rebound, no guarding, no peritoneal signs, no hepatosplenomegaly BACK: The back appears normal EXT: Normal ROM in all joints; no deformity noted, no edema; no cyanosis, no calf tenderness or calf  swelling SKIN: Normal color for age and race; warm; no rash on exposed skin NEURO: Moves all extremities equally PSYCH: The patient's mood and manner are appropriate.  ____________________________________________   LABS (all labs ordered are listed, but only abnormal results are displayed)  Labs Reviewed  CBC WITH DIFFERENTIAL/PLATELET - Abnormal; Notable for the following components:      Result Value   Eosinophils Absolute 0.6 (*)    All other components  within normal limits  COMPREHENSIVE METABOLIC PANEL - Abnormal; Notable for the following components:   Glucose, Bld 115 (*)    All other components within normal limits  RESP PANEL BY RT-PCR (FLU A&B, COVID) ARPGX2  CULTURE, BLOOD (ROUTINE X 2)  CULTURE, BLOOD (ROUTINE X 2)  PROCALCITONIN  LACTIC ACID, PLASMA  URINALYSIS, ROUTINE W REFLEX MICROSCOPIC  PROTIME-INR  TROPONIN I (HIGH SENSITIVITY)  TROPONIN I (HIGH SENSITIVITY)   ____________________________________________  EKG   EKG Interpretation  Date/Time:  Sunday August 02 2021 17:16:52 EDT Ventricular Rate:  83 PR Interval:  150 QRS Duration: 78 QT Interval:  372 QTC Calculation: 437 R Axis:   66 Text Interpretation: Normal sinus rhythm Normal ECG Confirmed by Pryor Curia [phone]) on 08/02/2021 11:43:18 PM        ____________________________________________  RADIOLOGY Jessie Foot Monterrio Gerst, personally viewed and evaluated these images (plain radiographs) as part of my medical decision making, as well as reviewing the written report by the radiologist.  ED MD interpretation: Chest x-ray clear but CT is concerning for bilateral lower lobe pneumonia.  Official radiology report(s): DG Chest 2 View  Result Date: 08/02/2021 CLINICAL DATA:  Cough and fever.  Shortness of breath. EXAM: CHEST - 2 VIEW COMPARISON:  08/25/2017 FINDINGS: Stable heart size. Unchanged mediastinal contours. Aortic atherosclerosis. No focal airspace disease or evidence of pneumonia. No  pulmonary edema, pleural effusion, or pneumothorax. Chronic arthropathy of both shoulders. IMPRESSION: No acute chest findings. Aortic Atherosclerosis (ICD10-I70.0). Electronically Signed   By: Keith Rake M.D.   On: 08/02/2021 17:36   CT CHEST WO CONTRAST  Result Date: 08/03/2021 CLINICAL DATA:  Cough, chronic dyspnea EXAM: CT CHEST WITHOUT CONTRAST TECHNIQUE: Multidetector CT imaging of the chest was performed following the standard protocol without IV contrast. COMPARISON:  Chest radiographs dated 08/02/2021 FINDINGS: Cardiovascular: Heart is top-normal in size. No pericardial effusion. No evidence thoracic aortic aneurysm. Atherosclerotic calcifications of the aortic arch. Coronary atherosclerosis of the LAD and left circumflex. Mediastinum/Nodes: No suspicious mediastinal lymphadenopathy. Visualized thyroid is unremarkable. Lungs/Pleura: Faint subpleural ground-glass opacities in the lungs bilaterally, for example in the left upper lobe (series 3/image 42). This appearance is nonspecific but raises the possibility of atypical infection, including COVID. Mild subpleural reticulation/fibrosis, suggesting chronic lung disease. Lower lobe predominant bronchiectasis. Mild subpleural nodularity, including a dominant 5 x 7 mm nodule in the left lower lobe (series 3/image 111). Linear scarring/atelectasis in the right middle lobe. No pleural effusion or pneumothorax. Upper Abdomen: Visualized upper abdomen is grossly unremarkable, noting vascular calcifications. Musculoskeletal: Degenerative changes of the visualized thoracolumbar spine. IMPRESSION: Faint subpleural ground-glass opacities in the lungs bilaterally, suggesting atypical infection, including COVID. Superimposed mild chronic interstitial lung disease with lower lobe predominant bronchiectasis. Subpleural nodularity, including a dominant 6 mm nodule (mean diameter) in the left lower lobe, likely benign. Non-contrast chest CT at 6-12 months is  recommended. If the nodule is stable at time of repeat CT, then future CT at 18-24 months (from today's scan) is considered optional for low-risk patients, but is recommended for high-risk patients. This recommendation follows the consensus statement: Guidelines for Management of Incidental Pulmonary Nodules Detected on CT Images: From the Fleischner Society 2017; Radiology 2017; 284:228-243. Aortic Atherosclerosis (ICD10-I70.0). Electronically Signed   By: Julian Hy M.D.   On: 08/03/2021 00:58    ____________________________________________   PROCEDURES  Procedure(s) performed (including Critical Care):  Procedures  CRITICAL CARE Performed by: Cyril Mourning Kamariah Fruchter   Total critical care time: 45 minutes  Critical care time was  exclusive of separately billable procedures and treating other patients.  Critical care was necessary to treat or prevent imminent or life-threatening deterioration.  Critical care was time spent personally by me on the following activities: development of treatment plan with patient and/or surrogate as well as nursing, discussions with consultants, evaluation of patient's response to treatment, examination of patient, obtaining history from patient or surrogate, ordering and performing treatments and interventions, ordering and review of laboratory studies, ordering and review of radiographic studies, pulse oximetry and re-evaluation of patient's condition.  ____________________________________________   INITIAL IMPRESSION / ASSESSMENT AND PLAN / ED COURSE  As part of my medical decision making, I reviewed the following data within the [place] History obtained from family, Nursing notes reviewed and incorporated, Labs reviewed , EKG interpreted , Old EKG reviewed, Old chart reviewed, Radiograph reviewed , Discussed with admitting physician , CT reviewed, and Notes from prior ED visits         Patient here with fevers, cough, wheezing.   Differential includes viral URI including influenza and COVID-19, pneumonia, PE, CHF, pneumothorax.  Sats currently 92% on room air.  She is wheezing with diffuse rhonchi.  Will give duo nebs, Solu-Medrol.  Will monitor for any hypoxia.  Labs obtained in triage show normal white blood cell count, electrolytes, renal function, troponin x2.  Chest x-ray reviewed by myself and radiology shows no infiltrate, edema, pneumothorax, pleural effusion.  Will obtain CT of the chest.  She reports she has an allergy to CT contrast and requires premedication.  I feel that CT can be done without contrast given my suspicion for PE is low especially given she is already on Coumadin for atrial fibrillation.  We will check her INR today.  Anticipate if patient continues to be hypoxic intermittently that she will need admission.  COVID and flu swabs pending.  ED PROGRESS  CT scan shows bibasilar pneumonia.  COVID and flu negative.  Will cover with antibiotics for community-acquired pneumonia.  Patient hypoxic to 87 to 89% on room air at rest.  Placed on 2 L nasal cannula.  Will admit.   2:12 AM Discussed patient's case with hospitalist, Dr. Damita Dunnings.  I have recommended admission and patient (and family if present) agree with this plan. Admitting physician will place admission orders.   I reviewed all nursing notes, vitals, pertinent previous records and reviewed/interpreted all EKGs, lab and urine results, imaging (as available).   ____________________________________________   FINAL CLINICAL IMPRESSION(S) / ED DIAGNOSES  Final diagnoses:  Community acquired bilateral lower lobe pneumonia  Bronchospasm  Hypoxia     ED Discharge Orders     None       *Please note:  Sarah Riddle was evaluated in Emergency Department on 08/03/2021 for the symptoms described in the history of present illness. She was evaluated in the context of the global COVID-19 pandemic, which necessitated consideration that the patient  might be at risk for infection with the SARS-CoV-2 virus that causes COVID-19. Institutional protocols and algorithms that pertain to the evaluation of patients at risk for COVID-19 are in a state of rapid change based on information released by regulatory bodies including the CDC and federal and state organizations. These policies and algorithms were followed during the patient's care in the ED.  Some ED evaluations and interventions may be delayed as a result of limited staffing during and the pandemic.*   Note:  This document was prepared using Dragon voice recognition software and may include unintentional dictation  errors.    Dwight Adamczak, Delice Bison, DO 08/03/21 [phone]

## 2021-08-04 DIAGNOSIS — J9801 Acute bronchospasm: Secondary | ICD-10-CM | POA: Diagnosis not present

## 2021-08-04 DIAGNOSIS — R0902 Hypoxemia: Secondary | ICD-10-CM | POA: Diagnosis not present

## 2021-08-04 DIAGNOSIS — J189 Pneumonia, unspecified organism: Secondary | ICD-10-CM | POA: Diagnosis not present

## 2021-08-04 DIAGNOSIS — J9601 Acute respiratory failure with hypoxia: Secondary | ICD-10-CM | POA: Diagnosis not present

## 2021-08-04 DIAGNOSIS — G894 Chronic pain syndrome: Secondary | ICD-10-CM | POA: Diagnosis not present

## 2021-08-04 DIAGNOSIS — Z7901 Long term (current) use of anticoagulants: Secondary | ICD-10-CM | POA: Diagnosis not present

## 2021-08-04 DIAGNOSIS — I10 Essential (primary) hypertension: Secondary | ICD-10-CM | POA: Diagnosis not present

## 2021-08-04 DIAGNOSIS — J209 Acute bronchitis, unspecified: Secondary | ICD-10-CM | POA: Diagnosis not present

## 2021-08-04 LAB — LEGIONELLA PNEUMOPHILA SEROGP 1 UR AG: L. pneumophila Serogp 1 Ur Ag: NEGATIVE

## 2021-08-04 LAB — PROTIME-INR
INR: 2.3 — ABNORMAL HIGH (ref 0.8–1.2)
Prothrombin Time: 24.9 seconds — ABNORMAL HIGH (ref 11.4–15.2)

## 2021-08-04 MED ORDER — WARFARIN SODIUM 2 MG PO TABS
2.0000 mg | ORAL_TABLET | Freq: Once | ORAL | Status: DC
  Filled 2021-08-04: qty 1

## 2021-08-04 NOTE — ED Notes (Signed)
Pulse ox goes down on and off.  Goes down into upper 80s, then comes back up.  She is sleeping deeply.  I placed her on oxygen at 2 liters and it maintains 99 %.

## 2021-08-04 NOTE — ED Notes (Addendum)
Pt reports breathing comfortably, no SOB or chest pain. Pt not normally on O2 at home and was on 2L/min nasal cannula overnight due to normally wearing a CPAP at home. SPO2 has been  in high 90's or 100% consistently since 7 am. Will try patient on room air at this time and continue to monitor.

## 2021-08-04 NOTE — ED Notes (Signed)
Pt assisted to restroom, standby assist.  Denies further needs

## 2021-08-04 NOTE — Care Management Obs Status (Signed)
[facility] NOTIFICATION   Patient Details  Name: Sarah Riddle MRN: [id number] Date of Birth: 04-10-1941   Medicare Observation Status Notification Given:  Yes    Adelene Amas, [place] 08/04/2021, 1:06 PM

## 2021-08-04 NOTE — Progress Notes (Signed)
ANTICOAGULATION CONSULT NOTE - Initial Consult  Pharmacy Consult for Warfarin  Indication: atrial fibrillation  Allergies  Allergen Reactions   Iodinated Diagnostic Agents Shortness Of Breath    Other reaction(s): Unknown   Ibuprofen Other (See Comments)    "stomach problems"    Lubiprostone Other (See Comments)    unknown    Sulfa Antibiotics Other (See Comments)    "Stomach problems"    Zolpidem Other (See Comments)    "hallucinations"  Other reaction(s): Confusion   Boniva [Ibandronic Acid]    Celecoxib Other (See Comments)    GI DISTRESS GI upset    Duloxetine Other (See Comments)   Tetracyclines & Related Nausea Only and Nausea And Vomiting    Patient Measurements: Height: 5\' 11"  (180.3 cm) Weight: 105.2 kg (232 lb) IBW/kg (Calculated) : 70.8 Heparin Dosing Weight:   Vital Signs: BP: 129/87 (11/01 1030) Pulse Rate: 76 (11/01 1030)  Labs: Recent Labs    08/02/21 1712 08/02/21 2348 08/03/21 0130 08/04/21 0608  HGB 12.4  --   --   --   HCT 38.1  --   --   --   PLT 200  --   --   --   LABPROT  --   --  25.0* 24.9*  INR  --   --  2.3* 2.3*  CREATININE 0.95  --   --   --   TROPONINIHS 16 16  --   --      Estimated Creatinine Clearance: 62 mL/min (by C-G formula based on SCr of 0.95 mg/dL).   Medical History: Past Medical History:  Diagnosis Date   A-fib [facility])    Age related osteoporosis    Cancer ([place])    Melanoma x 2 -- Lt Leg -1976 Rt Arm - 2014   Chronic bilateral low back pain    Chronic kidney disease    Fibrocystic breast disease    GERD (gastroesophageal reflux disease)    Hyperlipidemia    Hypertension    IBS (irritable bowel syndrome)    Lumbago    Neuropathy    Obstructive sleep apnea    Osteoarthritis of shoulder     Medications:  (Not in a hospital admission)  Assessment: Pharmacy consulted to dose warfarin in this 80 year old female admitted with CAP, Afib, on warfarin at home.   PTA : Warfarin 2 mg PO Sun, Tues, Thurs  and Fri            Warfarin 1 mg PO Sat, Mon and Wed  - last dose of warfarin was on Sat 10/29; Pt did not take Sun 10/30 dose.   DDI: allopurinol, azithromycin  INR 10/31 @ 0130 = 2.3 (therapeutic)   Goal of Therapy:  INR 2-3 Monitor platelets by anticoagulation protocol: Yes   Plan:  11/1: INR 2.3 Will continue home dosing schedule of warfarin and order 2 mg x 1 dose today Will check INR daily since pt is on abx for CAP.   Sherol Sabas A Maelyn Berrey 08/04/2021,10:36 AM

## 2021-08-04 NOTE — Care Management CC44 (Signed)
Condition Code 44 Documentation Completed  Patient Details  Name: TANIKA BRACCO MRN: [id number] Date of Birth: 1941-06-29   Condition Code 44 given:  Yes Patient signature on Condition Code 44 notice:  Yes Documentation of 2 MD's agreement:  Yes Code 44 added to claim:  Yes    Adelene Amas, [place] 08/04/2021, 1:06 PM

## 2021-08-04 NOTE — ED Notes (Signed)
Social worker Pharmacist, hospital contacted for code 44 per MD

## 2021-08-04 NOTE — ED Notes (Signed)
Pt requests nebulizer because reports that it helped significantly previously. Has PRN orders and will administer. Pt denies chest pain or SOB at this time. Pt has occasional cough that she reports is productive.

## 2021-08-04 NOTE — TOC Initial Note (Signed)
Transition of Care [facility]) - Initial/Assessment Note    Patient Details  Name: Sarah Riddle MRN: [id number] Date of Birth: Sep 21, 1941  Transition of Care [facility]) CM/SW Contact:    Ova Freshwater Phone Number: [phone] 08/04/2021, 1:29 PM  Clinical Narrative:                  Patient presents to [facility] due to SOB.  Patient is able perform ADLs independently or minimal assistance.  Patient has a walker at home.  Patient is amenable to home health PT, and does not have a preference.  CSW explained home health process and estimated timeline.  Patient verbalizes understanding. CSW contact [place] w/ [facility], who stated they are able to accept patient for home health PT.  Expected Discharge Plan: [place] Barriers to Discharge: No Barriers Identified   Patient Goals and CMS Choice        Expected Discharge Plan and Services Expected Discharge Plan: [place] In-house Referral: Clinical Social Work   Post Acute Care Choice: [place] arrangements for the past 2 months: [place] Expected Discharge Date: 08/04/21                 DME Agency:  ([place] [facility]) Date DME Agency Contacted: 08/04/21 Time DME Agency Contacted: [place]: [place] Date [place]: 08/04/21 Time [place]: [place] Representative spoke with at [place]: [place]  Prior Living Arrangements/Services Living arrangements for the past 2 months: [place] with:: Spouse, Adult Children ELYCE, ZOLLINGER (Spouse)   [phone] (Home Phone) Patient language and need for interpreter reviewed:: Yes Do you feel safe going back to the place where you live?: Yes      Need for Family Participation in Patient Care: Yes (Comment) Care giver support system in place?: Yes (comment) Current home services: DME (rollator walker) Criminal Activity/Legal Involvement Pertinent to  Current Situation/Hospitalization: No - Comment as needed  Activities of Daily Living      Permission Sought/Granted Permission sought to share information with : Facility Art therapist granted to share information with : Yes, Verbal Permission Granted  Share Information with NAME: LEVAEH, VICE (Spouse)   [phone] (Home Phone)  Permission granted to share info w AGENCY: [facility]        Emotional Assessment Appearance:: Appears stated age Attitude/Demeanor/Rapport: Gracious Affect (typically observed): Stable Orientation: : Oriented to Self, Oriented to Place, Oriented to  Time, Oriented to Situation Alcohol / Substance Use: Not Applicable Psych Involvement: No (comment)  Admission diagnosis:  CAP (community acquired pneumonia) [J18.9] Patient Active Problem List   Diagnosis Date Noted   CAP (community acquired pneumonia) 08/04/2021   Bronchospasm    Hypoxia    Community acquired bilateral lower lobe pneumonia 08/03/2021   Acute bronchitis    Atypical pneumonia    Acute respiratory failure with hypoxia ([place])    History of melanoma 05/20/2021   Family history of cancer 05/20/2021   Normocytic anemia 05/19/2021   History of allergy to radiographic contrast media 03/20/2020   Abnormal MRI, cervical spine (09/24/2019) 03/10/2020   Radicular pain of shoulder (Left) 03/10/2020   Cervical foraminal stenosis (Bilateral) (C3-4, C4-5, C5-6) 09/10/2019   Cervical grade 1 Anterolisthesis of C7/T1 09/10/2019   Cervical grade 1 Retrolisthesis of C4/C5 09/10/2019   Cervical facet hypertrophy 09/10/2019   Sensory peripheral neuropathy 02/15/2019   Neurogenic  pain 02/15/2019   Chronic kidney disease 01/24/2019   Fibrocystic breast disease 01/24/2019   GERD (gastroesophageal reflux disease) 01/24/2019   Gout 01/24/2019   Hyperlipidemia 01/24/2019   Irritable bowel syndrome 01/24/2019   Migraine headache 01/24/2019   Osteoporosis 01/24/2019    Chronic pain syndrome 01/24/2019   Chronic feet pain (Primary area of Pain) (Bilateral) (L>R) 01/24/2019   Chronic neck pain (Fourth Area of Pain) (Right) 01/24/2019   Cervicalgia (Right) 01/24/2019   Chronic shoulder pain (Fifth Area of Pain) (Left) 01/24/2019   Chronic knee pain (Sixth Area of Pain) (Left) 01/24/2019   Chronic anticoagulation (Coumadin) 01/24/2019   Disorder of skeletal system 01/24/2019   Pharmacologic therapy 01/24/2019   Problems influencing health status 01/24/2019   DDD (degenerative disc disease), cervical 01/24/2019   DDD (degenerative disc disease), lumbar 01/24/2019   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   Numbness and tingling of feet (Bilateral) 11/28/2018   PAF (paroxysmal atrial fibrillation) ([place]) 03/05/2018   Nontraumatic complete tear of rotator cuff (Left) 01/13/2018   Osteoarthritis of shoulder (Left) 01/13/2018   Rotator cuff tendinitis, left 01/13/2018   Osteoarthritis of knee (Left) 02/21/2017   Age-related osteoporosis without current pathological fracture 01/15/2016   Allergic rhinitis 01/15/2016   Hypertension 01/15/2016   Obstructive sleep apnea (adult) (pediatric) 01/15/2016   Pain in both lower extremities 10/29/2015   Chronic tension-type headache, not intractable 07/18/2014   Difficulty walking 07/18/2014   Mild obesity 07/18/2014   Neuropathy 07/18/2014   Chronic pain of lower extremity (Bilateral) 03/13/2014   Cervicogenic headache (Tertiary Area of Pain) (Right) 03/13/2014   Numbness and tingling 03/13/2014   Chronic low back pain (Secondary Area of Pain) (Bilateral) (R>L) without sciatica 01/08/2014   Osteoarthrosis, unspecified whether generalized or localized, pelvic region and thigh 01/08/2014   Melanoma of upper arm ([place]) 02/21/2013   PCP:  Perrin Maltese, MD Pharmacy:   [place], [place] - [address] [place] [address] Phone: [phone] Fax: [phone]     Social  Determinants of Health (SDOH) Interventions    Readmission Risk Interventions No flowsheet data found.

## 2021-08-04 NOTE — ED Notes (Signed)
Md at bedside

## 2021-08-06 LAB — FUNGITELL, SERUM: Fungitell Result: <31 pg/mL (ref <80)

## 2021-08-08 DIAGNOSIS — G4733 Obstructive sleep apnea (adult) (pediatric): Secondary | ICD-10-CM | POA: Diagnosis not present

## 2021-08-08 LAB — CULTURE, BLOOD (ROUTINE X 2)
Culture: NO GROWTH
Culture: NO GROWTH
Special Requests: ADEQUATE
Special Requests: ADEQUATE

## 2021-08-11 DIAGNOSIS — E782 Mixed hyperlipidemia: Secondary | ICD-10-CM | POA: Diagnosis not present

## 2021-08-11 DIAGNOSIS — I10 Essential (primary) hypertension: Secondary | ICD-10-CM | POA: Diagnosis not present

## 2021-08-11 DIAGNOSIS — J1289 Other viral pneumonia: Secondary | ICD-10-CM | POA: Diagnosis not present

## 2021-08-11 DIAGNOSIS — G63 Polyneuropathy in diseases classified elsewhere: Secondary | ICD-10-CM | POA: Diagnosis not present

## 2021-08-14 DIAGNOSIS — Z7901 Long term (current) use of anticoagulants: Secondary | ICD-10-CM | POA: Diagnosis not present

## 2021-08-14 DIAGNOSIS — I480 Paroxysmal atrial fibrillation: Secondary | ICD-10-CM | POA: Diagnosis not present

## 2021-08-19 ENCOUNTER — Ambulatory Visit
Admission: RE | Admit: 2021-08-19 | Discharge: 2021-08-19 | Disposition: A | Discharge status: Home / Self Care | Payer: Medicare PPO | Attending: Nurse Practitioner | Admitting: Nurse Practitioner

## 2021-08-19 ENCOUNTER — Ambulatory Visit
Admission: RE | Admit: 2021-08-19 | Discharge: 2021-08-19 | Disposition: A | Discharge status: Home / Self Care | Payer: Medicare PPO | Source: Ambulatory Visit | Attending: Nurse Practitioner | Admitting: Nurse Practitioner

## 2021-08-19 ENCOUNTER — Other Ambulatory Visit: Payer: Self-pay | Admitting: Nurse Practitioner

## 2021-08-19 DIAGNOSIS — J129 Viral pneumonia, unspecified: Secondary | ICD-10-CM | POA: Diagnosis not present

## 2021-08-19 DIAGNOSIS — I700 Atherosclerosis of aorta: Secondary | ICD-10-CM | POA: Diagnosis not present

## 2021-08-19 DIAGNOSIS — R059 Cough, unspecified: Secondary | ICD-10-CM | POA: Diagnosis not present

## 2021-08-19 DIAGNOSIS — R0602 Shortness of breath: Secondary | ICD-10-CM | POA: Diagnosis not present

## 2021-08-24 DIAGNOSIS — D2271 Melanocytic nevi of right lower limb, including hip: Secondary | ICD-10-CM | POA: Diagnosis not present

## 2021-08-24 DIAGNOSIS — X32XXXA Exposure to sunlight, initial encounter: Secondary | ICD-10-CM | POA: Diagnosis not present

## 2021-08-24 DIAGNOSIS — D2261 Melanocytic nevi of right upper limb, including shoulder: Secondary | ICD-10-CM | POA: Diagnosis not present

## 2021-08-24 DIAGNOSIS — D485 Neoplasm of uncertain behavior of skin: Secondary | ICD-10-CM | POA: Diagnosis not present

## 2021-08-24 DIAGNOSIS — C44712 Basal cell carcinoma of skin of right lower limb, including hip: Secondary | ICD-10-CM | POA: Diagnosis not present

## 2021-08-24 DIAGNOSIS — Z85820 Personal history of malignant melanoma of skin: Secondary | ICD-10-CM | POA: Diagnosis not present

## 2021-08-24 DIAGNOSIS — Z85828 Personal history of other malignant neoplasm of skin: Secondary | ICD-10-CM | POA: Diagnosis not present

## 2021-08-24 DIAGNOSIS — L570 Actinic keratosis: Secondary | ICD-10-CM | POA: Diagnosis not present

## 2021-08-25 DIAGNOSIS — Z9861 Coronary angioplasty status: Secondary | ICD-10-CM | POA: Diagnosis not present

## 2021-08-25 DIAGNOSIS — E669 Obesity, unspecified: Secondary | ICD-10-CM | POA: Diagnosis not present

## 2021-08-25 DIAGNOSIS — E781 Pure hyperglyceridemia: Secondary | ICD-10-CM | POA: Diagnosis not present

## 2021-08-25 DIAGNOSIS — I340 Nonrheumatic mitral (valve) insufficiency: Secondary | ICD-10-CM | POA: Diagnosis not present

## 2021-08-25 DIAGNOSIS — R0602 Shortness of breath: Secondary | ICD-10-CM | POA: Diagnosis not present

## 2021-08-25 DIAGNOSIS — I2510 Atherosclerotic heart disease of native coronary artery without angina pectoris: Secondary | ICD-10-CM | POA: Diagnosis not present

## 2021-08-25 DIAGNOSIS — I4891 Unspecified atrial fibrillation: Secondary | ICD-10-CM | POA: Diagnosis not present

## 2021-08-25 DIAGNOSIS — I10 Essential (primary) hypertension: Secondary | ICD-10-CM | POA: Diagnosis not present

## 2021-08-31 DIAGNOSIS — C44712 Basal cell carcinoma of skin of right lower limb, including hip: Secondary | ICD-10-CM | POA: Diagnosis not present

## 2021-09-09 DIAGNOSIS — E0590 Thyrotoxicosis, unspecified without thyrotoxic crisis or storm: Secondary | ICD-10-CM | POA: Diagnosis not present

## 2021-09-15 DIAGNOSIS — R7303 Prediabetes: Secondary | ICD-10-CM | POA: Diagnosis not present

## 2021-09-15 DIAGNOSIS — R5383 Other fatigue: Secondary | ICD-10-CM | POA: Diagnosis not present

## 2021-09-15 DIAGNOSIS — E782 Mixed hyperlipidemia: Secondary | ICD-10-CM | POA: Diagnosis not present

## 2021-09-15 DIAGNOSIS — G603 Idiopathic progressive neuropathy: Secondary | ICD-10-CM | POA: Diagnosis not present

## 2021-09-15 DIAGNOSIS — M1990 Unspecified osteoarthritis, unspecified site: Secondary | ICD-10-CM | POA: Diagnosis not present

## 2021-09-15 DIAGNOSIS — I10 Essential (primary) hypertension: Secondary | ICD-10-CM | POA: Diagnosis not present

## 2021-09-16 DIAGNOSIS — E0590 Thyrotoxicosis, unspecified without thyrotoxic crisis or storm: Secondary | ICD-10-CM | POA: Diagnosis not present

## 2021-09-16 DIAGNOSIS — I480 Paroxysmal atrial fibrillation: Secondary | ICD-10-CM | POA: Diagnosis not present

## 2021-09-29 ENCOUNTER — Other Ambulatory Visit: Payer: Self-pay

## 2021-09-29 ENCOUNTER — Inpatient Hospital Stay: Payer: Medicare PPO | Attending: Oncology

## 2021-09-29 DIAGNOSIS — R790 Abnormal level of blood mineral: Secondary | ICD-10-CM | POA: Diagnosis not present

## 2021-09-29 DIAGNOSIS — I4891 Unspecified atrial fibrillation: Secondary | ICD-10-CM | POA: Insufficient documentation

## 2021-09-29 DIAGNOSIS — Z7901 Long term (current) use of anticoagulants: Secondary | ICD-10-CM | POA: Diagnosis not present

## 2021-09-29 DIAGNOSIS — Z800 Family history of malignant neoplasm of digestive organs: Secondary | ICD-10-CM | POA: Diagnosis not present

## 2021-09-29 DIAGNOSIS — N189 Chronic kidney disease, unspecified: Secondary | ICD-10-CM | POA: Diagnosis not present

## 2021-09-29 DIAGNOSIS — N1832 Chronic kidney disease, stage 3b: Secondary | ICD-10-CM

## 2021-09-29 DIAGNOSIS — Z90710 Acquired absence of both cervix and uterus: Secondary | ICD-10-CM | POA: Diagnosis not present

## 2021-09-29 DIAGNOSIS — Z8042 Family history of malignant neoplasm of prostate: Secondary | ICD-10-CM | POA: Diagnosis not present

## 2021-09-29 DIAGNOSIS — Z806 Family history of leukemia: Secondary | ICD-10-CM | POA: Insufficient documentation

## 2021-09-29 DIAGNOSIS — I129 Hypertensive chronic kidney disease with stage 1 through stage 4 chronic kidney disease, or unspecified chronic kidney disease: Secondary | ICD-10-CM | POA: Diagnosis not present

## 2021-09-29 DIAGNOSIS — Z85820 Personal history of malignant melanoma of skin: Secondary | ICD-10-CM | POA: Insufficient documentation

## 2021-09-29 DIAGNOSIS — Z809 Family history of malignant neoplasm, unspecified: Secondary | ICD-10-CM

## 2021-09-29 LAB — COMPREHENSIVE METABOLIC PANEL
ALT: 14 U/L (ref 0–44)
AST: 19 U/L (ref 15–41)
Albumin: 3.7 g/dL (ref 3.5–5.0)
Alkaline Phosphatase: 67 U/L (ref 38–126)
Anion gap: 8 (ref 5–15)
BUN: 15 mg/dL (ref 8–23)
CO2: 29 mmol/L (ref 22–32)
Calcium: 9.1 mg/dL (ref 8.9–10.3)
Chloride: 101 mmol/L (ref 98–111)
Creatinine, Ser: 0.73 mg/dL (ref 0.44–1.00)
GFR, Estimated: >60 mL/min (ref >60)
Glucose, Bld: 107 mg/dL — ABNORMAL HIGH (ref 70–99)
Potassium: 3.5 mmol/L (ref 3.5–5.1)
Sodium: 138 mmol/L (ref 135–145)
Total Bilirubin: 0.6 mg/dL (ref 0.3–1.2)
Total Protein: 6.3 g/dL — ABNORMAL LOW (ref 6.5–8.1)

## 2021-09-29 LAB — CBC WITH DIFFERENTIAL/PLATELET
Abs Immature Granulocytes: 0.03 10*3/uL (ref 0.00–0.07)
Basophils Absolute: 0 10*3/uL (ref 0.0–0.1)
Basophils Relative: 1 %
Eosinophils Absolute: 0.2 10*3/uL (ref 0.0–0.5)
Eosinophils Relative: 4 %
HCT: 38.3 % (ref 36.0–46.0)
Hemoglobin: 12.2 g/dL (ref 12.0–15.0)
Immature Granulocytes: 1 %
Lymphocytes Relative: 33 %
Lymphs Abs: 1.8 10*3/uL (ref 0.7–4.0)
MCH: 29.3 pg (ref 26.0–34.0)
MCHC: 31.9 g/dL (ref 30.0–36.0)
MCV: 92.1 fL (ref 80.0–100.0)
Monocytes Absolute: 0.3 10*3/uL (ref 0.1–1.0)
Monocytes Relative: 5 %
Neutro Abs: 3.1 10*3/uL (ref 1.7–7.7)
Neutrophils Relative %: 56 %
Platelets: 216 10*3/uL (ref 150–400)
RBC: 4.16 MIL/uL (ref 3.87–5.11)
RDW: 13.4 % (ref 11.5–15.5)
WBC: 5.5 10*3/uL (ref 4.0–10.5)
nRBC: 0 % (ref 0.0–0.2)

## 2021-09-29 LAB — IRON AND TIBC
Iron: 107 ug/dL (ref 28–170)
Saturation Ratios: 41 % — ABNORMAL HIGH (ref 10.4–31.8)
TIBC: 259 ug/dL (ref 250–450)
UIBC: 152 ug/dL

## 2021-09-29 LAB — RETIC PANEL
Immature Retic Fract: 5.8 % (ref 2.3–15.9)
RBC.: 4.17 MIL/uL (ref 3.87–5.11)
Retic Count, Absolute: 63.4 10*3/uL (ref 19.0–186.0)
Retic Ct Pct: 1.5 % (ref 0.4–3.1)
Reticulocyte Hemoglobin: 33.9 pg (ref >27.9)

## 2021-09-29 LAB — FERRITIN: Ferritin: 54 ng/mL (ref 11–307)

## 2021-09-30 ENCOUNTER — Other Ambulatory Visit: Payer: Self-pay

## 2021-09-30 DIAGNOSIS — Z85820 Personal history of malignant melanoma of skin: Secondary | ICD-10-CM | POA: Diagnosis not present

## 2021-09-30 DIAGNOSIS — Z7901 Long term (current) use of anticoagulants: Secondary | ICD-10-CM | POA: Diagnosis not present

## 2021-09-30 DIAGNOSIS — Z800 Family history of malignant neoplasm of digestive organs: Secondary | ICD-10-CM | POA: Diagnosis not present

## 2021-09-30 DIAGNOSIS — Z90710 Acquired absence of both cervix and uterus: Secondary | ICD-10-CM | POA: Diagnosis not present

## 2021-09-30 DIAGNOSIS — N1832 Chronic kidney disease, stage 3b: Secondary | ICD-10-CM

## 2021-09-30 DIAGNOSIS — N189 Chronic kidney disease, unspecified: Secondary | ICD-10-CM | POA: Diagnosis not present

## 2021-09-30 DIAGNOSIS — Z8042 Family history of malignant neoplasm of prostate: Secondary | ICD-10-CM | POA: Diagnosis not present

## 2021-09-30 DIAGNOSIS — I4891 Unspecified atrial fibrillation: Secondary | ICD-10-CM | POA: Diagnosis not present

## 2021-09-30 DIAGNOSIS — I129 Hypertensive chronic kidney disease with stage 1 through stage 4 chronic kidney disease, or unspecified chronic kidney disease: Secondary | ICD-10-CM | POA: Diagnosis not present

## 2021-09-30 DIAGNOSIS — R790 Abnormal level of blood mineral: Secondary | ICD-10-CM | POA: Diagnosis not present

## 2021-10-02 ENCOUNTER — Inpatient Hospital Stay: Payer: Medicare PPO

## 2021-10-02 ENCOUNTER — Other Ambulatory Visit: Payer: Self-pay

## 2021-10-02 ENCOUNTER — Inpatient Hospital Stay: Payer: Medicare PPO | Admitting: Oncology

## 2021-10-02 ENCOUNTER — Encounter: Payer: Self-pay | Admitting: Oncology

## 2021-10-02 VITALS — BP 154/75 | HR 75 | Temp 98.0°F | Wt 226.1 lb

## 2021-10-02 DIAGNOSIS — Z800 Family history of malignant neoplasm of digestive organs: Secondary | ICD-10-CM | POA: Diagnosis not present

## 2021-10-02 DIAGNOSIS — Z809 Family history of malignant neoplasm, unspecified: Secondary | ICD-10-CM

## 2021-10-02 DIAGNOSIS — I4891 Unspecified atrial fibrillation: Secondary | ICD-10-CM | POA: Diagnosis not present

## 2021-10-02 DIAGNOSIS — I129 Hypertensive chronic kidney disease with stage 1 through stage 4 chronic kidney disease, or unspecified chronic kidney disease: Secondary | ICD-10-CM | POA: Diagnosis not present

## 2021-10-02 DIAGNOSIS — N189 Chronic kidney disease, unspecified: Secondary | ICD-10-CM | POA: Diagnosis not present

## 2021-10-02 DIAGNOSIS — R790 Abnormal level of blood mineral: Secondary | ICD-10-CM

## 2021-10-02 DIAGNOSIS — Z90710 Acquired absence of both cervix and uterus: Secondary | ICD-10-CM | POA: Diagnosis not present

## 2021-10-02 DIAGNOSIS — Z85820 Personal history of malignant melanoma of skin: Secondary | ICD-10-CM | POA: Diagnosis not present

## 2021-10-02 DIAGNOSIS — Z8042 Family history of malignant neoplasm of prostate: Secondary | ICD-10-CM | POA: Diagnosis not present

## 2021-10-02 DIAGNOSIS — Z7901 Long term (current) use of anticoagulants: Secondary | ICD-10-CM | POA: Diagnosis not present

## 2021-10-02 LAB — PROTEIN ELECTRO, RANDOM URINE
Albumin ELP, Urine: 36.4 %
Alpha-1-Globulin, U: 8.1 %
Alpha-2-Globulin, U: 10.2 %
Beta Globulin, U: 26.6 %
Gamma Globulin, U: 18.7 %
Total Protein, Urine: 40.3 mg/dL

## 2021-10-03 NOTE — Progress Notes (Signed)
Hematology/Oncology progress note    Patient Care Team: Perrin Maltese, MD as PCP - General (Internal Medicine)  REFERRING PROVIDER: Perrin Maltese, MD  CHIEF COMPLAINTS/REASON FOR VISIT:  Follow up for anemia  HISTORY OF PRESENTING ILLNESS:   Sarah Riddle is a  80 y.o.  female with PMH listed below was seen in consultation at the request of  Perrin Maltese, MD  for evaluation of anemia Reviewed records from primary care provider 04/09/2021, patient had a CBC done which showed a hemoglobin of 10.9, MCV 89.  Normal total WBC, normal platelet count.  Creatinine is slightly elevated and 1.13, EGFR 49, normal bilirubin. TSH 1.48 6 05/12/2021, repeat hemoglobin 10.4, MCV 91, normal platelet and total WBC. Patient reports some fatigue.  Denies any abdominal pain, black or bloody stool. Patient is on chronic anticoagulation with Coumadin for history of A. fib. Patient reports that she has previously taken oral iron supplementation which caused diarrhea.  She has a personal history of melanoma x2 status post resection by dermatologist.  She has a family history significant for father and 2 brothers with prostate cancer, a sister with acute leukemia.  Mother with colon cancer.  Patient reports to have colonoscopy done recently.  No colonoscopy records and currently picks   Sarah Riddle is a 80 y.o. female who has above history reviewed by me today presents for follow up visit for anemia Patient denies any new complaints. Patient had a recent hospitalization due to atypical pneumonia with bronchitis, respiratory failure in late October/early November 2022. She has lost 8 pounds since her last visit. Today she feels well.  She has increased her oral hydration.  Review of Systems  Constitutional:  Negative for appetite change, chills, fatigue and fever.  HENT:   Negative for hearing loss and voice change.   Eyes:  Negative for eye problems.  Respiratory:  Negative  for chest tightness and cough.   Cardiovascular:  Negative for chest pain.  Gastrointestinal:  Negative for abdominal distention, abdominal pain and blood in stool.  Endocrine: Negative for hot flashes.  Genitourinary:  Negative for difficulty urinating and frequency.   Musculoskeletal:  Negative for arthralgias.  Skin:  Negative for itching and rash.  Neurological:  Negative for extremity weakness.  Hematological:  Negative for adenopathy.  Psychiatric/Behavioral:  Negative for confusion.    MEDICAL HISTORY:  Past Medical History:  Diagnosis Date   A-fib [facility])    Age related osteoporosis    Cancer ([place])    Melanoma x 2 -- Lt Leg -1976 Rt Arm - 2014   Chronic bilateral low back pain    Chronic kidney disease    Fibrocystic breast disease    GERD (gastroesophageal reflux disease)    Hyperlipidemia    Hypertension    IBS (irritable bowel syndrome)    Lumbago    Neuropathy    Obstructive sleep apnea    Osteoarthritis of shoulder     SURGICAL HISTORY: Past Surgical History:  Procedure Laterality Date   ABDOMINAL HYSTERECTOMY     BACK SURGERY     BREAST CYST ASPIRATION Left 2001   Negative   CARDIAC ELECTROPHYSIOLOGY MAPPING AND ABLATION     EYE SURGERY     TOTAL HIP ARTHROPLASTY Left     SOCIAL HISTORY: Social History   Socioeconomic History   Marital status: Married    Spouse name: Not on file   Number of children: Not on file   Years of education: Not  on file   Highest education level: Not on file  Occupational History   Not on file  Tobacco Use   Smoking status: Never   Smokeless tobacco: Never  Substance and Sexual Activity   Alcohol use: No   Drug use: No   Sexual activity: Not on file  Other Topics Concern   Not on file  Social History Narrative   Not on file   Social Determinants of Health   Financial Resource Strain: Not on file  Food Insecurity: Not on file  Transportation Needs: Not on file  Physical Activity: Not on file  Stress: Not on  file  Social Connections: Not on file  Intimate Partner Violence: Not on file    FAMILY HISTORY: Family History  Problem Relation Age of Onset   Colon cancer Mother    Prostate cancer Father    Cancer Sister    Acute myelogenous leukemia Sister    Prostate cancer Brother    Prostate cancer Brother    Breast cancer Neg Hx     ALLERGIES:  is allergic to iodinated contrast media, ibuprofen, lubiprostone, sulfa antibiotics, zolpidem, boniva [ibandronic acid], celecoxib, duloxetine, and tetracyclines & related.  MEDICATIONS:  Current Outpatient Medications  Medication Sig Dispense Refill   acetaminophen (TYLENOL) 500 MG tablet Take 500 mg by mouth 2 (two) times a day.     alendronate (FOSAMAX) 70 MG tablet Take 70 mg by mouth once a week. Take with a full glass of water on an empty stomach.     allopurinol (ZYLOPRIM) 100 MG tablet Take 100 mg by mouth daily.     amitriptyline (ELAVIL) 25 MG tablet Take 25 mg by mouth at bedtime.     Cranberry 125 MG TABS Take by mouth daily.     ergocalciferol (VITAMIN D2) 1.25 MG (50000 UT) capsule Take 50,000 Units by mouth once a week.     ezetimibe (ZETIA) 10 MG tablet Take 10 mg by mouth daily.     ferrous sulfate 325 (65 FE) MG tablet Take 325 mg by mouth daily with breakfast.     fluticasone (FLONASE) 50 MCG/ACT nasal spray Place 2 sprays into both nostrils daily.     furosemide (LASIX) 20 MG tablet Take 1 tablet by mouth daily.     isosorbide mononitrate (IMDUR) 60 MG 24 hr tablet Take 60 mg by mouth daily.     levocetirizine (XYZAL) 5 MG tablet Take 5 mg by mouth daily.     losartan (COZAAR) 50 MG tablet Take 50 mg by mouth daily.     metoprolol succinate (TOPROL-XL) 25 MG 24 hr tablet Take 25 mg by mouth daily.     pantoprazole (PROTONIX) 40 MG tablet Take 20 mg by mouth daily.      rosuvastatin (CRESTOR) 40 MG tablet Take 40 mg by mouth daily.     sertraline (ZOLOFT) 25 MG tablet Take 25 mg by mouth 3 (three) times daily.      warfarin  (COUMADIN) 2 MG tablet Take 2 mg by mouth daily. $RemoveBefo'1mg'djnhCoKXuRX$  - sat, mon, wed $Remove'2mg'ghEjJTT$ - sun, tues, thurs, fri     gabapentin (NEURONTIN) 800 MG tablet Take 1 tablet (800 mg total) by mouth in the morning, at noon, in the evening, and at bedtime. 360 tablet 1   No current facility-administered medications for this visit.     PHYSICAL EXAMINATION: ECOG PERFORMANCE STATUS: 1 - Symptomatic but completely ambulatory Vitals:   10/02/21 1135  BP: (!) 154/75  Pulse: 75  Temp: 98  F (36.7 C)   Filed Weights   10/02/21 1135  Weight: 226 lb 1.6 oz (102.6 kg)    Physical Exam Constitutional:      General: She is not in acute distress.    Comments: Patient ambulates independently  HENT:     Head: Normocephalic and atraumatic.  Eyes:     General: No scleral icterus. Cardiovascular:     Rate and Rhythm: Normal rate and regular rhythm.     Heart sounds: Normal heart sounds.  Pulmonary:     Effort: Pulmonary effort is normal. No respiratory distress.     Breath sounds: No wheezing.  Abdominal:     General: Bowel sounds are normal. There is no distension.     Palpations: Abdomen is soft.  Musculoskeletal:        General: No deformity. Normal range of motion.     Cervical back: Normal range of motion and neck supple.  Skin:    General: Skin is warm and dry.     Findings: No erythema or rash.  Neurological:     Mental Status: She is alert and oriented to person, place, and time. Mental status is at baseline.     Cranial Nerves: No cranial nerve deficit.     Coordination: Coordination normal.  Psychiatric:        Mood and Affect: Mood normal.    LABORATORY DATA:  I have reviewed the data as listed Lab Results  Component Value Date   WBC 5.5 09/29/2021   HGB 12.2 09/29/2021   HCT 38.3 09/29/2021   MCV 92.1 09/29/2021   PLT 216 09/29/2021   Recent Labs    08/02/21 1712 09/29/21 1113  NA 140 138  K 3.9 3.5  CL 104 101  CO2 29 29  GLUCOSE 115* 107*  BUN 19 15  CREATININE 0.95 0.73   CALCIUM 9.3 9.1  GFRNONAA >60 >60  PROT 7.0 6.3*  ALBUMIN 3.8 3.7  AST 20 19  ALT 15 14  ALKPHOS 62 67  BILITOT 0.8 0.6   Iron/TIBC/Ferritin/ %Sat    Component Value Date/Time   IRON 107 09/29/2021 1113   TIBC 259 09/29/2021 1113   FERRITIN 54 09/29/2021 1113   IRONPCTSAT 41 (H) 09/29/2021 1113      RADIOGRAPHIC STUDIES: I have personally reviewed the radiological images as listed and agreed with the findings in the report. DG Chest 2 View  Result Date: 08/19/2021 CLINICAL DATA:  Nonproductive cough and shortness of breath. EXAM: CHEST - 2 VIEW COMPARISON:  August 02, 2021 FINDINGS: Diffuse mild to moderate severity chronic appearing increased interstitial lung markings are seen. There is no evidence of acute infiltrate, pleural effusion or pneumothorax. The heart size and mediastinal contours are within normal limits. Moderate severity calcification of the aortic arch is noted. The visualized skeletal structures are unremarkable. IMPRESSION: Stable exam without acute or active cardiopulmonary disease. Electronically Signed   By: Virgina Norfolk M.D.   On: 08/19/2021 23:57   DG Chest 2 View  Result Date: 08/02/2021 CLINICAL DATA:  Cough and fever.  Shortness of breath. EXAM: CHEST - 2 VIEW COMPARISON:  08/25/2017 FINDINGS: Stable heart size. Unchanged mediastinal contours. Aortic atherosclerosis. No focal airspace disease or evidence of pneumonia. No pulmonary edema, pleural effusion, or pneumothorax. Chronic arthropathy of both shoulders. IMPRESSION: No acute chest findings. Aortic Atherosclerosis (ICD10-I70.0). Electronically Signed   By: Keith Rake M.D.   On: 08/02/2021 17:36   CT CHEST WO CONTRAST  Result Date: 08/03/2021 CLINICAL DATA:  Cough, chronic dyspnea EXAM: CT CHEST WITHOUT CONTRAST TECHNIQUE: Multidetector CT imaging of the chest was performed following the standard protocol without IV contrast. COMPARISON:  Chest radiographs dated 08/02/2021 FINDINGS:  Cardiovascular: Heart is top-normal in size. No pericardial effusion. No evidence thoracic aortic aneurysm. Atherosclerotic calcifications of the aortic arch. Coronary atherosclerosis of the LAD and left circumflex. Mediastinum/Nodes: No suspicious mediastinal lymphadenopathy. Visualized thyroid is unremarkable. Lungs/Pleura: Faint subpleural ground-glass opacities in the lungs bilaterally, for example in the left upper lobe (series 3/image 42). This appearance is nonspecific but raises the possibility of atypical infection, including COVID. Mild subpleural reticulation/fibrosis, suggesting chronic lung disease. Lower lobe predominant bronchiectasis. Mild subpleural nodularity, including a dominant 5 x 7 mm nodule in the left lower lobe (series 3/image 111). Linear scarring/atelectasis in the right middle lobe. No pleural effusion or pneumothorax. Upper Abdomen: Visualized upper abdomen is grossly unremarkable, noting vascular calcifications. Musculoskeletal: Degenerative changes of the visualized thoracolumbar spine. IMPRESSION: Faint subpleural ground-glass opacities in the lungs bilaterally, suggesting atypical infection, including COVID. Superimposed mild chronic interstitial lung disease with lower lobe predominant bronchiectasis. Subpleural nodularity, including a dominant 6 mm nodule (mean diameter) in the left lower lobe, likely benign. Non-contrast chest CT at 6-12 months is recommended. If the nodule is stable at time of repeat CT, then future CT at 18-24 months (from today's scan) is considered optional for low-risk patients, but is recommended for high-risk patients. This recommendation follows the consensus statement: Guidelines for Management of Incidental Pulmonary Nodules Detected on CT Images: From the Fleischner Society 2017; Radiology 2017; 284:228-243. Aortic Atherosclerosis (ICD10-I70.0). Electronically Signed   By: Julian Hy M.D.   On: 08/03/2021 00:58       ASSESSMENT & PLAN:  1.  Abnormal iron saturation   2. Family history of cancer    Labs reviewed and discussed with patient. Both anemia and renal insufficiency have normalized.  Abnormal iron saturation. Stop iron supplementation.  Check hemachromatosis PCR.   Family history of cancer, refer to genetic testing.   She does not need to further follow-up with oncology unless her blood work today indicates. Patient will be discharged.  I recommend patient continue follow-up with primary care provider.  Patient agrees with the plan. Orders Placed This Encounter  Procedures   Hemochromatosis DNA-PCR(c282y,h63d)    Standing Status:   Future    Number of Occurrences:   1    Standing Expiration Date:   10/02/2022   Ambulatory referral to Genetics    Referral Priority:   Routine    Referral Type:   Consultation    Referral Reason:   Specialty Services Required    Number of Visits Requested:   1     All questions were answered. The patient knows to call the clinic with any problems questions or concerns.  cc Perrin Maltese, MD      Earlie Server, MD, PhD 10/03/2021

## 2021-10-08 LAB — HEMOCHROMATOSIS DNA-PCR(C282Y,H63D)

## 2021-10-13 ENCOUNTER — Telehealth: Payer: Self-pay

## 2021-10-13 NOTE — Telephone Encounter (Signed)
Called and left message to inform patient. No answer, left message to call back. Also, will send MyChart message.   [place], please schedule patient US abdomen next available and MD in a few weeks. Thanks

## 2021-10-13 NOTE — Telephone Encounter (Signed)
Pt informed of [place] appt and follow up.

## 2021-10-13 NOTE — Telephone Encounter (Signed)
Patient informed and verbalized understanding

## 2021-10-13 NOTE — Telephone Encounter (Signed)
-----   Message from Earlie Server, MD sent at 10/12/2021  9:53 PM EST ----- Please let her know that her blood work showed she has gene mutation for hemachromatosis. No urgent intervention for now.  Please arrange her to do US abdomen -reason is hemachromatosis Please schedule her to see me in a few weeks, MD only . Thanks.

## 2021-10-20 ENCOUNTER — Ambulatory Visit
Admission: RE | Admit: 2021-10-20 | Discharge: 2021-10-20 | Disposition: A | Discharge status: Home / Self Care | Payer: Medicare PPO | Source: Ambulatory Visit | Attending: Oncology | Admitting: Oncology

## 2021-10-20 ENCOUNTER — Other Ambulatory Visit: Payer: Self-pay

## 2021-10-20 DIAGNOSIS — R932 Abnormal findings on diagnostic imaging of liver and biliary tract: Secondary | ICD-10-CM | POA: Diagnosis not present

## 2021-10-21 ENCOUNTER — Inpatient Hospital Stay: Payer: Medicare PPO | Attending: Oncology | Admitting: Licensed Clinical Social Worker

## 2021-10-21 ENCOUNTER — Inpatient Hospital Stay: Payer: Medicare PPO

## 2021-10-21 ENCOUNTER — Encounter: Payer: Self-pay | Admitting: Licensed Clinical Social Worker

## 2021-10-21 DIAGNOSIS — Z90710 Acquired absence of both cervix and uterus: Secondary | ICD-10-CM | POA: Insufficient documentation

## 2021-10-21 DIAGNOSIS — Z806 Family history of leukemia: Secondary | ICD-10-CM | POA: Insufficient documentation

## 2021-10-21 DIAGNOSIS — Z803 Family history of malignant neoplasm of breast: Secondary | ICD-10-CM | POA: Diagnosis not present

## 2021-10-21 DIAGNOSIS — Z800 Family history of malignant neoplasm of digestive organs: Secondary | ICD-10-CM | POA: Diagnosis not present

## 2021-10-21 DIAGNOSIS — I4891 Unspecified atrial fibrillation: Secondary | ICD-10-CM | POA: Insufficient documentation

## 2021-10-21 DIAGNOSIS — Z8042 Family history of malignant neoplasm of prostate: Secondary | ICD-10-CM

## 2021-10-21 DIAGNOSIS — C4360 Malignant melanoma of unspecified upper limb, including shoulder: Secondary | ICD-10-CM | POA: Diagnosis not present

## 2021-10-21 DIAGNOSIS — Z808 Family history of malignant neoplasm of other organs or systems: Secondary | ICD-10-CM

## 2021-10-21 DIAGNOSIS — R932 Abnormal findings on diagnostic imaging of liver and biliary tract: Secondary | ICD-10-CM | POA: Insufficient documentation

## 2021-10-21 DIAGNOSIS — N189 Chronic kidney disease, unspecified: Secondary | ICD-10-CM | POA: Insufficient documentation

## 2021-10-21 DIAGNOSIS — I129 Hypertensive chronic kidney disease with stage 1 through stage 4 chronic kidney disease, or unspecified chronic kidney disease: Secondary | ICD-10-CM | POA: Insufficient documentation

## 2021-10-21 NOTE — Progress Notes (Signed)
REFERRING PROVIDER: Earlie Server, MD [place],  [place] [address]  PRIMARY PROVIDER:  Perrin Maltese, MD  PRIMARY REASON FOR VISIT:  1. Family history of melanoma   2. Family history of prostate cancer   3. Family history of breast cancer   4. Family history of colon cancer   5. Malignant melanoma of upper arm, unspecified laterality ([place])      HISTORY OF PRESENT ILLNESS:   Sarah Riddle, a 81 y.o. female, was seen for a [place] cancer genetics consultation at the request of Dr. Tasia Catchings due to a personal and family history of cancer.  Sarah Riddle presents to clinic today to discuss the possibility of a hereditary predisposition to cancer, genetic testing, and to further clarify her future cancer risks, as well as potential cancer risks for family members.   In 1976 Sarah Riddle had melanoma removed from her left leg, she had another melanoma removed from her arm in 2014. She also reports having many squamous cell carcinomas of the skin.   CANCER HISTORY:  Oncology History   No history exists.     RISK FACTORS:  Menarche was at age 29.  First live birth at age 32.  OCP use for very short time.  Ovaries intact: no.  Hysterectomy: yes - endometriosis, had this in the 70s.  Menopausal status: postmenopausal.  HRT use: 10 years. Colonoscopy: yes;  polyps . Mammogram within the last year: yes. Number of breast biopsies: 0.  Past Medical History:  Diagnosis Date   A-fib [facility])    Age related osteoporosis    Cancer ([place])    Melanoma x 2 -- Lt Leg -1976 Rt Arm - 2014   Chronic bilateral low back pain    Chronic kidney disease    Family history of breast cancer    Family history of colon cancer    Family history of melanoma    Family history of prostate cancer    Fibrocystic breast disease    GERD (gastroesophageal reflux disease)    Hyperlipidemia    Hypertension    IBS (irritable bowel syndrome)    Lumbago    Neuropathy    Obstructive sleep apnea     Osteoarthritis of shoulder     Past Surgical History:  Procedure Laterality Date   ABDOMINAL HYSTERECTOMY     BACK SURGERY     BREAST CYST ASPIRATION Left 2001   Negative   CARDIAC ELECTROPHYSIOLOGY MAPPING AND ABLATION     EYE SURGERY     TOTAL HIP ARTHROPLASTY Left     Social History   Socioeconomic History   Marital status: Married    Spouse name: Not on file   Number of children: Not on file   Years of education: Not on file   Highest education level: Not on file  Occupational History   Not on file  Tobacco Use   Smoking status: Never   Smokeless tobacco: Never  Substance and Sexual Activity   Alcohol use: No   Drug use: No   Sexual activity: Not on file  Other Topics Concern   Not on file  Social History Narrative   Not on file   Social Determinants of Health   Financial Resource Strain: Not on file  Food Insecurity: Not on file  Transportation Needs: Not on file  Physical Activity: Not on file  Stress: Not on file  Social Connections: Not on file     FAMILY HISTORY:  We obtained a detailed,  4-generation family history.  Significant diagnoses are listed below: Family History  Problem Relation Age of Onset   Colon cancer Mother 42   Prostate cancer Father        dx 11s   Cancer Sister    Acute myelogenous leukemia Sister        dx 58s   Prostate cancer Brother    Melanoma Brother    Prostate cancer Brother    Melanoma Brother    HIV/AIDS Brother 21   Tuberculosis Paternal Aunt    Melanoma Other    Breast cancer Other    Melanoma Niece    Sarah Riddle has 1 daughter, 7. She had 3 brothers and 1 sister. One of her brothers is living at 58 and has had melanoma multiple times, and prostate cancer and squamous cell skin cancers. Sarah Riddle sister died of AML at 25 and had two daughters. One daughter has had melanoma, and she has two daughters, one had melanoma at a young age and 70 had breast cancer at a young age. Sarah Riddle other brother died at 67  of melanoma and prostate cancer, and another brother died of AIDS in his 67s.   Sarah Riddle mother died of colon cancer at 52. Patient had 2 maternal uncles. One maternal cousin was recently diagnosed with prostate cancer at 40. Maternal grandmother died in her 27s, grandfather died in his 82s-70s.  Sarah Riddle father died at 28 and had history of prostate cancer in his 92s. Patient had 2 paternal aunts, both died of tuberculosis at young ages. No known cousins/grandparents with cancer on this side.   Sarah Riddle is unaware of previous family history of genetic testing for hereditary cancer risks. Patient's maternal ancestors are of unknown descent, and paternal ancestors are of unknown descent. There is no reported Ashkenazi Jewish ancestry. There is no known consanguinity.     GENETIC COUNSELING ASSESSMENT: Sarah Riddle is a 81 y.o. female with a personal and family history of melanoma/prostate cancer which is somewhat suggestive of a hereditary cancer syndrome and predisposition to cancer. We, therefore, discussed and recommended the following at today's visit.   DISCUSSION: We discussed that approximately 10% of cancer is hereditary. There are genes associated with an increased risk for melanoma such as MITF and CDKN2A, and BRCA2 gene mutations increase the risk for both melanoma and prostate cancer (as well as breast, etc). There are other genes associated with hereditary cancer as well. Cancers and risks are gene specific.  We discussed that testing is beneficial for several reasons including knowing about other cancer risks, identifying potential screening and risk-reduction options that may be appropriate, and to understand if other family members could be at risk for cancer and allow them to undergo genetic testing.   We reviewed the characteristics, features and inheritance patterns of hereditary cancer syndromes. We also discussed genetic testing, including the appropriate family members to  test, the process of testing, insurance coverage and turn-around-time for results. We discussed the implications of a negative, positive and/or variant of uncertain significant result. We recommended Sarah Riddle pursue genetic testing for the [facility] Multi-Cancer+RNA gene panel.   Based on Sarah Riddle's personal and family history of cancer, she meets medical criteria for genetic testing. Despite that she meets criteria, she may still have an out of pocket cost. We discussed that if her out of pocket cost for testing is over $100, the laboratory will call and confirm whether she wants to proceed with testing.  If the  out of pocket cost of testing is less than $100 she will be billed by the genetic testing laboratory.   PLAN: After considering the risks, benefits, and limitations, Sarah Riddle provided informed consent to pursue genetic testing and the blood sample was sent to [facility] for analysis of the Multi-Cancer+RNA panel. Results should be available within approximately 2-3 weeks' time, at which point they will be disclosed by telephone to Sarah Riddle, as will any additional recommendations warranted by these results. Sarah Riddle will receive a summary of her genetic counseling visit and a copy of her results once available. This information will also be available in Epic.   Sarah Riddle questions were answered to her satisfaction today. Our contact information was provided should additional questions or concerns arise. Thank you for the referral and allowing [place] to share in the care of your patient.   Faith Rogue, MS, [facility] Genetic Counselor [email or web address] Phone: [phone]  The patient was seen for a total of 30 minutes in face-to-face genetic counseling.  Patient was seen alone.  Dr. Grayland Ormond was available for discussion regarding this case.   _______________________________________________________________________ For Office Staff:  Number of people involved in  session: 1 Was an Intern/ student involved with case: no

## 2021-10-27 DIAGNOSIS — E782 Mixed hyperlipidemia: Secondary | ICD-10-CM | POA: Diagnosis not present

## 2021-10-27 DIAGNOSIS — R5383 Other fatigue: Secondary | ICD-10-CM | POA: Diagnosis not present

## 2021-10-27 DIAGNOSIS — R7303 Prediabetes: Secondary | ICD-10-CM | POA: Diagnosis not present

## 2021-10-27 DIAGNOSIS — I10 Essential (primary) hypertension: Secondary | ICD-10-CM | POA: Diagnosis not present

## 2021-11-02 ENCOUNTER — Other Ambulatory Visit: Payer: Self-pay

## 2021-11-02 ENCOUNTER — Inpatient Hospital Stay: Payer: Medicare PPO | Admitting: Oncology

## 2021-11-02 ENCOUNTER — Encounter: Payer: Self-pay | Admitting: Oncology

## 2021-11-02 DIAGNOSIS — R932 Abnormal findings on diagnostic imaging of liver and biliary tract: Secondary | ICD-10-CM

## 2021-11-02 DIAGNOSIS — I10 Essential (primary) hypertension: Secondary | ICD-10-CM | POA: Diagnosis not present

## 2021-11-02 DIAGNOSIS — Z808 Family history of malignant neoplasm of other organs or systems: Secondary | ICD-10-CM | POA: Diagnosis not present

## 2021-11-02 DIAGNOSIS — Z8042 Family history of malignant neoplasm of prostate: Secondary | ICD-10-CM | POA: Diagnosis not present

## 2021-11-02 DIAGNOSIS — Z809 Family history of malignant neoplasm, unspecified: Secondary | ICD-10-CM

## 2021-11-02 DIAGNOSIS — R7303 Prediabetes: Secondary | ICD-10-CM | POA: Diagnosis not present

## 2021-11-02 DIAGNOSIS — E782 Mixed hyperlipidemia: Secondary | ICD-10-CM | POA: Diagnosis not present

## 2021-11-02 DIAGNOSIS — Z800 Family history of malignant neoplasm of digestive organs: Secondary | ICD-10-CM | POA: Diagnosis not present

## 2021-11-02 DIAGNOSIS — I129 Hypertensive chronic kidney disease with stage 1 through stage 4 chronic kidney disease, or unspecified chronic kidney disease: Secondary | ICD-10-CM | POA: Diagnosis not present

## 2021-11-02 DIAGNOSIS — M1990 Unspecified osteoarthritis, unspecified site: Secondary | ICD-10-CM | POA: Diagnosis not present

## 2021-11-02 DIAGNOSIS — N189 Chronic kidney disease, unspecified: Secondary | ICD-10-CM | POA: Diagnosis not present

## 2021-11-02 DIAGNOSIS — Z806 Family history of leukemia: Secondary | ICD-10-CM | POA: Diagnosis not present

## 2021-11-02 DIAGNOSIS — I4891 Unspecified atrial fibrillation: Secondary | ICD-10-CM | POA: Diagnosis not present

## 2021-11-02 DIAGNOSIS — Z803 Family history of malignant neoplasm of breast: Secondary | ICD-10-CM | POA: Diagnosis not present

## 2021-11-02 DIAGNOSIS — G619 Inflammatory polyneuropathy, unspecified: Secondary | ICD-10-CM | POA: Diagnosis not present

## 2021-11-02 DIAGNOSIS — Z90710 Acquired absence of both cervix and uterus: Secondary | ICD-10-CM | POA: Diagnosis not present

## 2021-11-02 NOTE — Progress Notes (Addendum)
number Hematology/Oncology progress note    Patient Care Team: Perrin Maltese, MD as PCP - General (Internal Medicine)  REFERRING PROVIDER: Perrin Maltese, MD  CHIEF COMPLAINTS/REASON FOR VISIT:  Follow up for homozygous hemochromatosis gene mutation, abnormal ultrasound liver  HISTORY OF PRESENTING ILLNESS:   Sarah Riddle is a  81 y.o.  female with PMH listed below was seen in consultation at the request of  Perrin Maltese, MD  for evaluation of anemia Reviewed records from primary care provider 04/09/2021, patient had a CBC done which showed a hemoglobin of 10.9, MCV 89.  Normal total WBC, normal platelet count.  Creatinine is slightly elevated and 1.13, EGFR 49, normal bilirubin. TSH 1.48 6 05/12/2021, repeat hemoglobin 10.4, MCV 91, normal platelet and total WBC. Patient reports some fatigue.  Denies any abdominal pain, black or bloody stool. Patient is on chronic anticoagulation with Coumadin for history of A. fib. Patient reports that she has previously taken oral iron supplementation which caused diarrhea.  She has a personal history of melanoma x2 status post resection by dermatologist.  She has a family history significant for father and 2 brothers with prostate cancer, a sister with acute leukemia.  Mother with colon cancer.  Patient reports to have colonoscopy done recently.  No colonoscopy records and currently picks   Sarah Riddle is a 81 y.o. female who has above history reviewed by me today presents for follow up visit for homozygous hemochromatosis gene mutation, abnormal liver ultrasound.  Interval, patient has had ultrasound abdomen of right upper quadrant done. 10/20/2020, [place] right upper quadrant showed coarse increased echogenicity of the parenchyma with no focal mass in the liver.   Review of Systems  Constitutional:  Negative for appetite change, chills, fatigue and fever.  HENT:   Negative for hearing loss and voice change.   Eyes:   Negative for eye problems.  Respiratory:  Negative for chest tightness and cough.   Cardiovascular:  Negative for chest pain.  Gastrointestinal:  Negative for abdominal distention, abdominal pain and blood in stool.  Endocrine: Negative for hot flashes.  Genitourinary:  Negative for difficulty urinating and frequency.   Musculoskeletal:  Negative for arthralgias.  Skin:  Negative for itching and rash.  Neurological:  Negative for extremity weakness.  Hematological:  Negative for adenopathy.  Psychiatric/Behavioral:  Negative for confusion.    MEDICAL HISTORY:  Past Medical History:  Diagnosis Date   A-fib [facility])    Age related osteoporosis    Cancer ([place])    Melanoma x 2 -- Lt Leg -1976 Rt Arm - 2014   Chronic bilateral low back pain    Chronic kidney disease    Family history of breast cancer    Family history of colon cancer    Family history of melanoma    Family history of prostate cancer    Fibrocystic breast disease    GERD (gastroesophageal reflux disease)    Hyperlipidemia    Hypertension    IBS (irritable bowel syndrome)    Lumbago    Neuropathy    Obstructive sleep apnea    Osteoarthritis of shoulder     SURGICAL HISTORY: Past Surgical History:  Procedure Laterality Date   ABDOMINAL HYSTERECTOMY     BACK SURGERY     BREAST CYST ASPIRATION Left 2001   Negative   CARDIAC ELECTROPHYSIOLOGY MAPPING AND ABLATION     EYE SURGERY     TOTAL HIP ARTHROPLASTY Left     SOCIAL HISTORY:  Social History   Socioeconomic History   Marital status: Married    Spouse name: Not on file   Number of children: Not on file   Years of education: Not on file   Highest education level: Not on file  Occupational History   Not on file  Tobacco Use   Smoking status: Never   Smokeless tobacco: Never  Substance and Sexual Activity   Alcohol use: No   Drug use: No   Sexual activity: Not on file  Other Topics Concern   Not on file  Social History Narrative   Not on file    Social Determinants of Health   Financial Resource Strain: Not on file  Food Insecurity: Not on file  Transportation Needs: Not on file  Physical Activity: Not on file  Stress: Not on file  Social Connections: Not on file  Intimate Partner Violence: Not on file    FAMILY HISTORY: Family History  Problem Relation Age of Onset   Colon cancer Mother 51   Prostate cancer Father        dx 1s   Cancer Sister    Acute myelogenous leukemia Sister        dx 31s   Prostate cancer Brother    Melanoma Brother    Prostate cancer Brother    Melanoma Brother    HIV/AIDS Brother 73   Tuberculosis Paternal Aunt    Melanoma Other    Breast cancer Other    Melanoma Niece     ALLERGIES:  is allergic to iodinated contrast media, ibuprofen, lubiprostone, sulfa antibiotics, zolpidem, boniva [ibandronic acid], celecoxib, duloxetine, and tetracyclines & related.  MEDICATIONS:  Current Outpatient Medications  Medication Sig Dispense Refill   acetaminophen (TYLENOL) 500 MG tablet Take 500 mg by mouth 2 (two) times a day.     alendronate (FOSAMAX) 70 MG tablet Take 70 mg by mouth once a week. Take with a full glass of water on an empty stomach.     allopurinol (ZYLOPRIM) 100 MG tablet Take 100 mg by mouth daily.     amitriptyline (ELAVIL) 25 MG tablet Take 25 mg by mouth at bedtime.     Cranberry 125 MG TABS Take by mouth daily.     ergocalciferol (VITAMIN D2) 1.25 MG (50000 UT) capsule Take 50,000 Units by mouth once a week.     ezetimibe (ZETIA) 10 MG tablet Take 10 mg by mouth daily.     fluticasone (FLONASE) 50 MCG/ACT nasal spray Place 2 sprays into both nostrils daily.     furosemide (LASIX) 20 MG tablet Take 1 tablet by mouth daily.     isosorbide mononitrate (IMDUR) 60 MG 24 hr tablet Take 60 mg by mouth daily.     levocetirizine (XYZAL) 5 MG tablet Take 5 mg by mouth daily.     losartan (COZAAR) 50 MG tablet Take 50 mg by mouth daily.     metoprolol succinate (TOPROL-XL) 25 MG 24 hr  tablet Take 25 mg by mouth daily.     pantoprazole (PROTONIX) 40 MG tablet Take 20 mg by mouth daily.      rosuvastatin (CRESTOR) 40 MG tablet Take 40 mg by mouth daily.     sertraline (ZOLOFT) 25 MG tablet Take 25 mg by mouth 3 (three) times daily.      warfarin (COUMADIN) 2 MG tablet Take 2 mg by mouth daily. $RemoveBefo'1mg'vUmgLUazRhy$  - sat, mon, wed $Remove'2mg'kyMuTrO$ - sun, tues, thurs, fri     ferrous sulfate 325 (65 FE) MG  tablet Take 325 mg by mouth daily with breakfast.     gabapentin (NEURONTIN) 800 MG tablet Take 1 tablet (800 mg total) by mouth in the morning, at noon, in the evening, and at bedtime. 360 tablet 1   No current facility-administered medications for this visit.     PHYSICAL EXAMINATION: ECOG PERFORMANCE STATUS: 1 - Symptomatic but completely ambulatory Vitals:   11/02/21 1325  BP: 126/73  Pulse: 72  Temp: (!) 97.2 F (36.2 C)   Filed Weights   11/02/21 1325  Weight: 225 lb (102.1 kg)    Physical Exam Constitutional:      General: She is not in acute distress.    Comments: Patient ambulates independently  HENT:     Head: Normocephalic and atraumatic.  Eyes:     General: No scleral icterus. Cardiovascular:     Rate and Rhythm: Normal rate and regular rhythm.     Heart sounds: Normal heart sounds.  Pulmonary:     Effort: Pulmonary effort is normal. No respiratory distress.     Breath sounds: No wheezing.  Abdominal:     General: Bowel sounds are normal. There is no distension.     Palpations: Abdomen is soft.  Musculoskeletal:        General: No deformity. Normal range of motion.     Cervical back: Normal range of motion and neck supple.  Skin:    General: Skin is warm and dry.     Findings: No erythema or rash.  Neurological:     Mental Status: She is alert and oriented to person, place, and time. Mental status is at baseline.     Cranial Nerves: No cranial nerve deficit.     Coordination: Coordination normal.  Psychiatric:        Mood and Affect: Mood normal.    LABORATORY  DATA:  I have reviewed the data as listed Lab Results  Component Value Date   WBC 5.5 09/29/2021   HGB 12.2 09/29/2021   HCT 38.3 09/29/2021   MCV 92.1 09/29/2021   PLT 216 09/29/2021   Recent Labs    08/02/21 1712 09/29/21 1113  NA 140 138  K 3.9 3.5  CL 104 101  CO2 29 29  GLUCOSE 115* 107*  BUN 19 15  CREATININE 0.95 0.73  CALCIUM 9.3 9.1  GFRNONAA >60 >60  PROT 7.0 6.3*  ALBUMIN 3.8 3.7  AST 20 19  ALT 15 14  ALKPHOS 62 67  BILITOT 0.8 0.6    Iron/TIBC/Ferritin/ %Sat    Component Value Date/Time   IRON 107 09/29/2021 1113   TIBC 259 09/29/2021 1113   FERRITIN 54 09/29/2021 1113   IRONPCTSAT 41 (H) 09/29/2021 1113      RADIOGRAPHIC STUDIES: I have personally reviewed the radiological images as listed and agreed with the findings in the report. DG Chest 2 View  Result Date: 08/19/2021 CLINICAL DATA:  Nonproductive cough and shortness of breath. EXAM: CHEST - 2 VIEW COMPARISON:  August 02, 2021 FINDINGS: Diffuse mild to moderate severity chronic appearing increased interstitial lung markings are seen. There is no evidence of acute infiltrate, pleural effusion or pneumothorax. The heart size and mediastinal contours are within normal limits. Moderate severity calcification of the aortic arch is noted. The visualized skeletal structures are unremarkable. IMPRESSION: Stable exam without acute or active cardiopulmonary disease. Electronically Signed   By: Virgina Norfolk M.D.   On: 08/19/2021 23:57   US Abdomen Limited RUQ (LIVER/GB)  Result Date: 10/20/2021 CLINICAL DATA:  Hemochromatosis EXAM: ULTRASOUND ABDOMEN LIMITED RIGHT UPPER QUADRANT COMPARISON:  None. FINDINGS: Gallbladder: Surgically absent Common bile duct: Diameter: 3 mm Liver: Coarse, increased echogenicity of the parenchyma with no focal mass identified. Portal vein is patent on color Doppler imaging with normal direction of blood flow towards the liver. Other: None. IMPRESSION: Coarse, increased  echogenicity of the liver parenchyma with no focal mass identified. Electronically Signed   By: Ofilia Neas M.D.   On: 10/20/2021 14:42       ASSESSMENT & PLAN:  1. Hereditary hemochromatosis ([place])   2. Family history of cancer   3. Abnormal ultrasound of liver    #Hereditary hemochromatosis, homozygous HD63 mutation.  Her iron panel does not indicate any iron overload.  Ferritin 54, iron saturation less than 45. Diagnosis of hemochromatosis gene mutation was reviewed and discussed with patient. Suggest patient to avoid alcohol, avoid iron supplementation and vitamin C supplementation.  Avoid uncooked seafood. No need for urgent phlebotomy given that her iron level is at/close to her target level.  #Abdominal ultrasound liver Liver ultrasound showed coarse and increased echogenicity. I will refer patient establish care with gastroenterology Dr. Virgina Jock for further evaluation.  Family history of cancer, patient will establish care with genetic counseling.    Patient will follow-up in 3 months Orders Placed This Encounter  Procedures   CBC with Differential/Platelet    Standing Status:   Future    Standing Expiration Date:   11/02/2022   Ferritin    Standing Status:   Future    Standing Expiration Date:   11/02/2022   Iron and TIBC    Standing Status:   Future    Standing Expiration Date:   11/02/2022   Hepatic function panel    Standing Status:   Future    Standing Expiration Date:   11/02/2022   Ambulatory referral to Gastroenterology    Referral Priority:   Routine    Referral Type:   Consultation    Referral Reason:   Specialty Services Required    Referred to Provider:   Annamaria Helling, DO    Number of Visits Requested:   1     All questions were answered. The patient knows to call the clinic with any problems questions or concerns.  cc Perrin Maltese, MD      Earlie Server, MD, PhD 11/02/2021

## 2021-11-03 ENCOUNTER — Telehealth: Payer: Self-pay | Admitting: Licensed Clinical Social Worker

## 2021-11-03 ENCOUNTER — Telehealth: Payer: Self-pay

## 2021-11-03 NOTE — Telephone Encounter (Signed)
Patients encounter from 11/02/2021 has been faxed to PCP.

## 2021-11-03 NOTE — Telephone Encounter (Signed)
-----   Message from Earlie Server, MD sent at 11/02/2021  9:54 PM EST ----- Please fax a copy of my note to her PCP thanks.

## 2021-11-09 ENCOUNTER — Telehealth: Payer: Self-pay | Admitting: Licensed Clinical Social Worker

## 2021-11-09 NOTE — Telephone Encounter (Signed)
error 

## 2021-11-10 ENCOUNTER — Ambulatory Visit: Payer: Self-pay | Admitting: Licensed Clinical Social Worker

## 2021-11-10 ENCOUNTER — Encounter: Payer: Self-pay | Admitting: Licensed Clinical Social Worker

## 2021-11-10 DIAGNOSIS — Z1211 Encounter for screening for malignant neoplasm of colon: Secondary | ICD-10-CM | POA: Insufficient documentation

## 2021-11-10 DIAGNOSIS — Z1379 Encounter for other screening for genetic and chromosomal anomalies: Secondary | ICD-10-CM

## 2021-11-10 DIAGNOSIS — Z0000 Encounter for general adult medical examination without abnormal findings: Secondary | ICD-10-CM | POA: Insufficient documentation

## 2021-11-10 NOTE — Progress Notes (Signed)
HPI:  Sarah Riddle was previously seen in the [facility] due to a family history of cancer and concerns regarding a hereditary predisposition to cancer. Please refer to our prior cancer genetics clinic note for more information regarding our discussion, assessment and recommendations, at the time. Sarah Riddle recent genetic test results were disclosed to her, as were recommendations warranted by these results. These results and recommendations are discussed in more detail below.  CANCER HISTORY:  Oncology History   No history exists.    FAMILY HISTORY:  We obtained a detailed, 4-generation family history.  Significant diagnoses are listed below: Family History  Problem Relation Age of Onset   Colon cancer Mother 14   Prostate cancer Father        dx 23s   Cancer Sister    Acute myelogenous leukemia Sister        dx 58s   Prostate cancer Brother    Melanoma Brother    Prostate cancer Brother    Melanoma Brother    HIV/AIDS Brother 4   Tuberculosis Paternal Aunt    Melanoma Other    Breast cancer Other    Melanoma Niece    Sarah Riddle has 1 daughter, 41. She had 3 brothers and 1 sister. One of her brothers is living at 37 and has had melanoma multiple times, and prostate cancer and squamous cell skin cancers. Sarah Riddle sister died of AML at 66 and had two daughters. One daughter has had melanoma, and she has two daughters, one had melanoma at a young age and 58 had breast cancer at a young age. Sarah Riddle other brother died at 35 of melanoma and prostate cancer, and another brother died of AIDS in his 6s.    Sarah Riddle mother died of colon cancer at 19. Patient had 2 maternal uncles. One maternal cousin was recently diagnosed with prostate cancer at 66. Maternal grandmother died in her 27s, grandfather died in his 42s-70s.   Sarah Riddle father died at 19 and had history of prostate cancer in his 57s. Patient had 2 paternal aunts, both died of tuberculosis  at young ages. No known cousins/grandparents with cancer on this side.    Sarah Riddle is unaware of previous family history of genetic testing for hereditary cancer risks. Patient's maternal ancestors are of unknown descent, and paternal ancestors are of unknown descent. There is no reported Ashkenazi Jewish ancestry. There is no known consanguinity.       GENETIC TEST RESULTS: Genetic testing reported out on 11/02/2021 through the [facility] Multi-Cancer+RNA cancer panel found no pathogenic mutations.   The Multi-Cancer Panel + RNA offered by [facility] includes sequencing and/or deletion duplication testing of the following 84 genes: AIP, ALK, APC, ATM, AXIN2,BAP1,  BARD1, BLM, BMPR1A, BRCA1, BRCA2, BRIP1, CASR, CDC73, CDH1, CDK4, CDKN1B, CDKN1C, CDKN2A (p14ARF), CDKN2A (p16INK4a), CEBPA, CHEK2, CTNNA1, DICER1, DIS3L2, EGFR (c.2369C>T, p.Thr790Met variant only), EPCAM (Deletion/duplication testing only), FH, FLCN, GATA2, GPC3, GREM1 (Promoter region deletion/duplication testing only), HOXB13 (c.251G>A, p.Gly84Glu), HRAS, KIT, MAX, MEN1, MET, MITF (c.952G>A, p.Glu318Lys variant only), MLH1, MSH2, MSH3, MSH6, MUTYH, NBN, NF1, NF2, NTHL1, PALB2, PDGFRA, PHOX2B, PMS2, POLD1, POLE, POT1, PRKAR1A, PTCH1, PTEN, RAD50, RAD51C, RAD51D, RB1, RECQL4, RET, RUNX1, SDHAF2, SDHA (sequence changes only), SDHB, SDHC, SDHD, SMAD4, SMARCA4, SMARCB1, SMARCE1, STK11, SUFU, TERC, TERT, TMEM127, TP53, TSC1, TSC2, VHL, WRN and WT1.   The test report has been scanned into EPIC and is located under the Molecular Pathology section of the Results Review tab.  A portion of the result report is included below for reference.     We discussed that because current genetic testing is not perfect, it is possible there may be a gene mutation in one of these genes that current testing cannot detect, but that chance is small.  There could be another gene that has not yet been discovered, or that we have not yet tested, that is responsible for  the cancer diagnoses in the family. It is also possible there is a hereditary cause for the cancer in the family that Ms. Trapani did not inherit and therefore was not identified in her testing.  Therefore, it is important to remain in touch with cancer genetics in the future so that we can continue to offer Ms. Bridgett the most up to date genetic testing.   ADDITIONAL GENETIC TESTING: We discussed with Sarah Riddle that her genetic testing was fairly extensive.  If there are genes identified to increase cancer risk that can be analyzed in the future, we would be happy to discuss and coordinate this testing at that time.    CANCER SCREENING RECOMMENDATIONS: Sarah Riddle test result is considered negative (normal).  This means that we have not identified a hereditary cause for her  personal and family history of melanoma/cancer at this time. Most cancers happen by chance and this negative test suggests that her cancer may fall into this category.    While reassuring, this does not definitively rule out a hereditary predisposition to cancer. It is still possible that there could be genetic mutations that are undetectable by current technology. There could be genetic mutations in genes that have not been tested or identified to increase cancer risk.  Therefore, it is recommended she continue to follow the cancer management and screening guidelines provided by her primary healthcare provider.   An individual's cancer risk and medical management are not determined by genetic test results alone. Overall cancer risk assessment incorporates additional factors, including personal medical history, family history, and any available genetic information that may result in a personalized plan for cancer prevention and surveillance.  RECOMMENDATIONS FOR FAMILY MEMBERS:  Relatives in this family might be at some increased risk of developing cancer, over the general population risk, simply due to the family history of cancer.   We recommended female relatives in this family have a yearly mammogram beginning at age 33, or 23 years younger than the earliest onset of cancer, an annual clinical breast exam, and perform monthly breast self-exams. Female relatives in this family should also have a gynecological exam as recommended by their primary provider.  All family members should be referred for colonoscopy starting at age 3.    It is also possible there is a hereditary cause for the cancer in Ms. Scantling's family that she did not inherit and therefore was not identified in her.  Based on Ms. Peltz's family history, we recommended those who have had cancer/melanoma have genetic counseling and testing. Ms. Lennox will let us know if we can be of any assistance in coordinating genetic counseling and/or testing for these family members.  FOLLOW-UP: Lastly, we discussed with Sarah Riddle that cancer genetics is a rapidly advancing field and it is possible that new genetic tests will be appropriate for her and/or her family members in the future. We encouraged her to remain in contact with cancer genetics on an annual basis so we can update her personal and family histories and let her know of advances in cancer genetics that  may benefit this family.   Our contact number was provided. Sarah Riddle questions were answered to her satisfaction, and she knows she is welcome to call us at anytime with additional questions or concerns.   Faith Rogue, MS, [facility] Genetic Counselor [email or web address] Phone: [phone]

## 2021-11-10 NOTE — Telephone Encounter (Signed)
Revealed negative genetic testing.  This normal result is reassuring.  It is unlikely that there is an increased risk of cancer due to a mutation in one of these genes.  However, genetic testing is not perfect, and cannot definitively rule out a hereditary cause.  It will be important for her to keep in contact with genetics to learn if any additional testing may be needed in the future.      

## 2021-11-24 DIAGNOSIS — E669 Obesity, unspecified: Secondary | ICD-10-CM | POA: Diagnosis not present

## 2021-11-24 DIAGNOSIS — I10 Essential (primary) hypertension: Secondary | ICD-10-CM | POA: Diagnosis not present

## 2021-11-24 DIAGNOSIS — I2510 Atherosclerotic heart disease of native coronary artery without angina pectoris: Secondary | ICD-10-CM | POA: Diagnosis not present

## 2021-11-24 DIAGNOSIS — I4891 Unspecified atrial fibrillation: Secondary | ICD-10-CM | POA: Diagnosis not present

## 2021-11-24 DIAGNOSIS — E782 Mixed hyperlipidemia: Secondary | ICD-10-CM | POA: Diagnosis not present

## 2021-11-24 DIAGNOSIS — I340 Nonrheumatic mitral (valve) insufficiency: Secondary | ICD-10-CM | POA: Diagnosis not present

## 2021-11-27 DIAGNOSIS — M79674 Pain in right toe(s): Secondary | ICD-10-CM | POA: Diagnosis not present

## 2021-11-27 DIAGNOSIS — B351 Tinea unguium: Secondary | ICD-10-CM | POA: Diagnosis not present

## 2021-11-27 DIAGNOSIS — M2012 Hallux valgus (acquired), left foot: Secondary | ICD-10-CM | POA: Diagnosis not present

## 2021-11-27 DIAGNOSIS — M2041 Other hammer toe(s) (acquired), right foot: Secondary | ICD-10-CM | POA: Diagnosis not present

## 2021-11-27 DIAGNOSIS — M79675 Pain in left toe(s): Secondary | ICD-10-CM | POA: Diagnosis not present

## 2021-11-27 DIAGNOSIS — L600 Ingrowing nail: Secondary | ICD-10-CM | POA: Diagnosis not present

## 2021-11-27 DIAGNOSIS — M2042 Other hammer toe(s) (acquired), left foot: Secondary | ICD-10-CM | POA: Diagnosis not present

## 2021-12-08 DIAGNOSIS — G4733 Obstructive sleep apnea (adult) (pediatric): Secondary | ICD-10-CM | POA: Diagnosis not present

## 2021-12-08 DIAGNOSIS — R0609 Other forms of dyspnea: Secondary | ICD-10-CM | POA: Diagnosis not present

## 2021-12-29 ENCOUNTER — Other Ambulatory Visit (HOSPITAL_COMMUNITY): Payer: Self-pay | Admitting: Gastroenterology

## 2021-12-29 ENCOUNTER — Other Ambulatory Visit: Payer: Self-pay | Admitting: Gastroenterology

## 2021-12-29 DIAGNOSIS — I4891 Unspecified atrial fibrillation: Secondary | ICD-10-CM | POA: Diagnosis not present

## 2021-12-29 DIAGNOSIS — K760 Fatty (change of) liver, not elsewhere classified: Secondary | ICD-10-CM

## 2021-12-29 DIAGNOSIS — Z800 Family history of malignant neoplasm of digestive organs: Secondary | ICD-10-CM | POA: Diagnosis not present

## 2021-12-29 DIAGNOSIS — D649 Anemia, unspecified: Secondary | ICD-10-CM | POA: Diagnosis not present

## 2022-01-04 ENCOUNTER — Ambulatory Visit
Admission: RE | Admit: 2022-01-04 | Discharge: 2022-01-04 | Disposition: A | Discharge status: Home / Self Care | Payer: Medicare PPO | Source: Ambulatory Visit | Attending: Gastroenterology | Admitting: Gastroenterology

## 2022-01-04 DIAGNOSIS — K760 Fatty (change of) liver, not elsewhere classified: Secondary | ICD-10-CM | POA: Diagnosis not present

## 2022-01-04 DIAGNOSIS — K7689 Other specified diseases of liver: Secondary | ICD-10-CM | POA: Diagnosis not present

## 2022-01-19 DIAGNOSIS — D2262 Melanocytic nevi of left upper limb, including shoulder: Secondary | ICD-10-CM | POA: Diagnosis not present

## 2022-01-19 DIAGNOSIS — D2272 Melanocytic nevi of left lower limb, including hip: Secondary | ICD-10-CM | POA: Diagnosis not present

## 2022-01-19 DIAGNOSIS — Z85820 Personal history of malignant melanoma of skin: Secondary | ICD-10-CM | POA: Diagnosis not present

## 2022-01-19 DIAGNOSIS — Z85828 Personal history of other malignant neoplasm of skin: Secondary | ICD-10-CM | POA: Diagnosis not present

## 2022-01-19 DIAGNOSIS — D225 Melanocytic nevi of trunk: Secondary | ICD-10-CM | POA: Diagnosis not present

## 2022-01-19 DIAGNOSIS — D485 Neoplasm of uncertain behavior of skin: Secondary | ICD-10-CM | POA: Diagnosis not present

## 2022-01-19 DIAGNOSIS — L570 Actinic keratosis: Secondary | ICD-10-CM | POA: Diagnosis not present

## 2022-01-19 DIAGNOSIS — D2261 Melanocytic nevi of right upper limb, including shoulder: Secondary | ICD-10-CM | POA: Diagnosis not present

## 2022-01-19 DIAGNOSIS — D045 Carcinoma in situ of skin of trunk: Secondary | ICD-10-CM | POA: Diagnosis not present

## 2022-01-19 DIAGNOSIS — C44629 Squamous cell carcinoma of skin of left upper limb, including shoulder: Secondary | ICD-10-CM | POA: Diagnosis not present

## 2022-01-29 ENCOUNTER — Inpatient Hospital Stay: Payer: Medicare PPO | Attending: Oncology

## 2022-01-29 LAB — CBC WITH DIFFERENTIAL/PLATELET
Abs Immature Granulocytes: 0.02 10*3/uL (ref 0.00–0.07)
Basophils Absolute: 0 10*3/uL (ref 0.0–0.1)
Basophils Relative: 1 %
Eosinophils Absolute: 0.3 10*3/uL (ref 0.0–0.5)
Eosinophils Relative: 4 %
HCT: 37.6 % (ref 36.0–46.0)
Hemoglobin: 12 g/dL (ref 12.0–15.0)
Immature Granulocytes: 0 %
Lymphocytes Relative: 30 %
Lymphs Abs: 2.3 10*3/uL (ref 0.7–4.0)
MCH: 29.3 pg (ref 26.0–34.0)
MCHC: 31.9 g/dL (ref 30.0–36.0)
MCV: 91.7 fL (ref 80.0–100.0)
Monocytes Absolute: 0.4 10*3/uL (ref 0.1–1.0)
Monocytes Relative: 5 %
Neutro Abs: 4.5 10*3/uL (ref 1.7–7.7)
Neutrophils Relative %: 60 %
Platelets: 205 10*3/uL (ref 150–400)
RBC: 4.1 MIL/uL (ref 3.87–5.11)
RDW: 12.7 % (ref 11.5–15.5)
WBC: 7.5 10*3/uL (ref 4.0–10.5)
nRBC: 0 % (ref 0.0–0.2)

## 2022-01-29 LAB — HEPATIC FUNCTION PANEL
ALT: 12 U/L (ref 0–44)
AST: 16 U/L (ref 15–41)
Albumin: 3.6 g/dL (ref 3.5–5.0)
Alkaline Phosphatase: 60 U/L (ref 38–126)
Bilirubin, Direct: 0.2 mg/dL (ref 0.0–0.2)
Indirect Bilirubin: 0.6 mg/dL (ref 0.3–0.9)
Total Bilirubin: 0.8 mg/dL (ref 0.3–1.2)
Total Protein: 6.6 g/dL (ref 6.5–8.1)

## 2022-01-29 LAB — IRON AND TIBC
Iron: 84 ug/dL (ref 28–170)
Saturation Ratios: 31 % (ref 10.4–31.8)
TIBC: 273 ug/dL (ref 250–450)
UIBC: 189 ug/dL

## 2022-01-29 LAB — FERRITIN: Ferritin: 42 ng/mL (ref 11–307)

## 2022-01-31 DIAGNOSIS — I4891 Unspecified atrial fibrillation: Secondary | ICD-10-CM | POA: Diagnosis not present

## 2022-01-31 DIAGNOSIS — I10 Essential (primary) hypertension: Secondary | ICD-10-CM | POA: Diagnosis not present

## 2022-01-31 DIAGNOSIS — I2510 Atherosclerotic heart disease of native coronary artery without angina pectoris: Secondary | ICD-10-CM | POA: Diagnosis not present

## 2022-02-01 ENCOUNTER — Encounter: Payer: Self-pay | Admitting: Oncology

## 2022-02-01 ENCOUNTER — Inpatient Hospital Stay: Payer: Medicare PPO | Attending: Oncology | Admitting: Oncology

## 2022-02-01 DIAGNOSIS — K700 Alcoholic fatty liver: Secondary | ICD-10-CM | POA: Diagnosis not present

## 2022-02-01 DIAGNOSIS — Z7901 Long term (current) use of anticoagulants: Secondary | ICD-10-CM | POA: Insufficient documentation

## 2022-02-01 DIAGNOSIS — G4733 Obstructive sleep apnea (adult) (pediatric): Secondary | ICD-10-CM | POA: Insufficient documentation

## 2022-02-01 DIAGNOSIS — R5383 Other fatigue: Secondary | ICD-10-CM | POA: Insufficient documentation

## 2022-02-01 DIAGNOSIS — Z79899 Other long term (current) drug therapy: Secondary | ICD-10-CM | POA: Diagnosis not present

## 2022-02-01 DIAGNOSIS — K219 Gastro-esophageal reflux disease without esophagitis: Secondary | ICD-10-CM | POA: Diagnosis not present

## 2022-02-01 DIAGNOSIS — E785 Hyperlipidemia, unspecified: Secondary | ICD-10-CM | POA: Insufficient documentation

## 2022-02-01 DIAGNOSIS — I129 Hypertensive chronic kidney disease with stage 1 through stage 4 chronic kidney disease, or unspecified chronic kidney disease: Secondary | ICD-10-CM | POA: Diagnosis not present

## 2022-02-01 DIAGNOSIS — Z8042 Family history of malignant neoplasm of prostate: Secondary | ICD-10-CM | POA: Diagnosis not present

## 2022-02-01 DIAGNOSIS — K760 Fatty (change of) liver, not elsewhere classified: Secondary | ICD-10-CM | POA: Insufficient documentation

## 2022-02-01 DIAGNOSIS — Z800 Family history of malignant neoplasm of digestive organs: Secondary | ICD-10-CM | POA: Insufficient documentation

## 2022-02-01 DIAGNOSIS — K580 Irritable bowel syndrome with diarrhea: Secondary | ICD-10-CM | POA: Diagnosis not present

## 2022-02-01 DIAGNOSIS — Z809 Family history of malignant neoplasm, unspecified: Secondary | ICD-10-CM | POA: Diagnosis not present

## 2022-02-01 DIAGNOSIS — I4891 Unspecified atrial fibrillation: Secondary | ICD-10-CM | POA: Insufficient documentation

## 2022-02-01 DIAGNOSIS — Z808 Family history of malignant neoplasm of other organs or systems: Secondary | ICD-10-CM | POA: Insufficient documentation

## 2022-02-01 DIAGNOSIS — R197 Diarrhea, unspecified: Secondary | ICD-10-CM | POA: Diagnosis not present

## 2022-02-01 DIAGNOSIS — Z85820 Personal history of malignant melanoma of skin: Secondary | ICD-10-CM | POA: Diagnosis not present

## 2022-02-01 NOTE — Progress Notes (Signed)
Pt here for follow up. No new concerns voiced.   

## 2022-02-01 NOTE — Progress Notes (Addendum)
?Hematology/Oncology progress note ? ? ? ?Patient Care Team: ?Perrin Maltese, MD as PCP - General (Internal Medicine) ? ?REFERRING PROVIDER: ?Perrin Maltese, MD  ?CHIEF COMPLAINTS/REASON FOR VISIT:  ?Follow up for homozygous hemochromatosis gene mutation, abnormal ultrasound liver ? ?HISTORY OF PRESENTING ILLNESS:  ? ?Sarah Riddle is a  81 y.o.  female with PMH listed below was seen in consultation at the request of  Perrin Maltese, MD  for evaluation of anemia ?Reviewed records from primary care provider ?04/09/2021, patient had a CBC done which showed a hemoglobin of 10.9, MCV 89.  Normal total WBC, normal platelet count.  Creatinine is slightly elevated and 1.13, EGFR 49, normal bilirubin. ?TSH 1.48 ?6 05/12/2021, repeat hemoglobin 10.4, MCV 91, normal platelet and total WBC. ?Patient reports some fatigue.  Denies any abdominal pain, black or bloody stool. ?Patient is on chronic anticoagulation with Coumadin for history of A. fib. ?Patient reports that she has previously taken oral iron supplementation which caused diarrhea.  She has a personal history of melanoma x2 status post resection by dermatologist. ? ?She has a family history significant for father and 2 brothers with prostate cancer, a sister with acute leukemia.  Mother with colon cancer. ? ?Patient reports to have colonoscopy done recently.  No colonoscopy records and currently picks ? ?10/20/2020, [place] right upper quadrant showed coarse increased echogenicity of the parenchyma with no focal mass in the liver. ? ?INTERVAL HISTORY ?Sarah Riddle is a 81 y.o. female who has above history reviewed by me today presents for follow up visit for homozygous hemochromatosis gene mutation, abnormal liver ultrasound. ?Patient has no new complaints ?During interval patient has establish care with gastroenterology Dr. Virgina Jock ?01/04/2022, ultrasound elastography of the liver showed diffusely increased hepatic parenchymal echogenicity with decreased acoustic through  transmission most commonly reflects hepatic steatosis.  No overt hepatic lesion discovered. ?01/18/2022, ultrasound right upper quadrant showed increased echogenicity of the liver parenchyma. ? ? ? ?Review of Systems  ?Constitutional:  Negative for appetite change, chills, fatigue and fever.  ?HENT:   Negative for hearing loss and voice change.   ?Eyes:  Negative for eye problems.  ?Respiratory:  Negative for chest tightness and cough.   ?Cardiovascular:  Negative for chest pain.  ?Gastrointestinal:  Negative for abdominal distention, abdominal pain and blood in stool.  ?Endocrine: Negative for hot flashes.  ?Genitourinary:  Negative for difficulty urinating and frequency.   ?Musculoskeletal:  Negative for arthralgias.  ?Skin:  Negative for itching and rash.  ?Neurological:  Negative for extremity weakness.  ?Hematological:  Negative for adenopathy.  ?Psychiatric/Behavioral:  Negative for confusion.   ? ?MEDICAL HISTORY:  ?Past Medical History:  ?Diagnosis Date  ? A-fib ([place])   ? Age related osteoporosis   ? Cancer [facility])   ? Melanoma x 2 -- Lt Leg -1976 Rt Arm - 2014  ? Chronic bilateral low back pain   ? Chronic kidney disease   ? Family history of breast cancer   ? Family history of colon cancer   ? Family history of melanoma   ? Family history of prostate cancer   ? Fibrocystic breast disease   ? GERD (gastroesophageal reflux disease)   ? Hyperlipidemia   ? Hypertension   ? IBS (irritable bowel syndrome)   ? Lumbago   ? Neuropathy   ? Obstructive sleep apnea   ? Osteoarthritis of shoulder   ? ? ?SURGICAL HISTORY: ?Past Surgical History:  ?Procedure Laterality Date  ? ABDOMINAL HYSTERECTOMY    ?  BACK SURGERY    ? BREAST CYST ASPIRATION Left 2001  ? Negative  ? CARDIAC ELECTROPHYSIOLOGY MAPPING AND ABLATION    ? EYE SURGERY    ? TOTAL HIP ARTHROPLASTY Left   ? ? ?SOCIAL HISTORY: ?Social History  ? ?Socioeconomic History  ? Marital status: Married  ?  Spouse name: Not on file  ? Number of children: Not on file  ?  Years of education: Not on file  ? Highest education level: Not on file  ?Occupational History  ? Not on file  ?Tobacco Use  ? Smoking status: Never  ? Smokeless tobacco: Never  ?Substance and Sexual Activity  ? Alcohol use: No  ? Drug use: No  ? Sexual activity: Not on file  ?Other Topics Concern  ? Not on file  ?Social History Narrative  ? Not on file  ? ?Social Determinants of Health  ? ?Financial Resource Strain: Not on file  ?Food Insecurity: Not on file  ?Transportation Needs: Not on file  ?Physical Activity: Not on file  ?Stress: Not on file  ?Social Connections: Not on file  ?Intimate Partner Violence: Not on file  ? ? ?FAMILY HISTORY: ?Family History  ?Problem Relation Age of Onset  ? Colon cancer Mother 51  ? Prostate cancer Father   ?     dx 62s  ? Cancer Sister   ? Acute myelogenous leukemia Sister   ?     dx 36s  ? Prostate cancer Brother   ? Melanoma Brother   ? Prostate cancer Brother   ? Melanoma Brother   ? HIV/AIDS Brother 108  ? Tuberculosis Paternal Aunt   ? Melanoma Other   ? Breast cancer Other   ? Melanoma Niece   ? ? ?ALLERGIES:  is allergic to iodinated contrast media, ibuprofen, lubiprostone, sulfa antibiotics, zolpidem, boniva [ibandronic acid], celecoxib, duloxetine, and tetracyclines & related. ? ?MEDICATIONS:  ?Current Outpatient Medications  ?Medication Sig Dispense Refill  ? alendronate (FOSAMAX) 70 MG tablet Take 70 mg by mouth once a week. Take with a full glass of water on an empty stomach.    ? allopurinol (ZYLOPRIM) 100 MG tablet Take 100 mg by mouth daily.    ? amitriptyline (ELAVIL) 25 MG tablet Take 50 mg by mouth at bedtime.    ? Cranberry 125 MG TABS Take by mouth daily.    ? ergocalciferol (VITAMIN D2) 1.25 MG (50000 UT) capsule Take 50,000 Units by mouth once a week.    ? ezetimibe (ZETIA) 10 MG tablet Take 10 mg by mouth daily.    ? fluticasone (FLONASE) 50 MCG/ACT nasal spray Place 2 sprays into both nostrils daily.    ? furosemide (LASIX) 20 MG tablet Take 1 tablet by  mouth daily.    ? gabapentin (NEURONTIN) 800 MG tablet Take 1 tablet (800 mg total) by mouth in the morning, at noon, in the evening, and at bedtime. 360 tablet 1  ? isosorbide mononitrate (IMDUR) 60 MG 24 hr tablet Take 60 mg by mouth daily.    ? levocetirizine (XYZAL) 5 MG tablet Take 5 mg by mouth daily.    ? losartan (COZAAR) 50 MG tablet Take 50 mg by mouth daily.    ? metoprolol succinate (TOPROL-XL) 25 MG 24 hr tablet Take 25 mg by mouth daily.    ? pantoprazole (PROTONIX) 40 MG tablet Take 20 mg by mouth daily.     ? rosuvastatin (CRESTOR) 40 MG tablet Take 40 mg by mouth daily.    ?  sertraline (ZOLOFT) 25 MG tablet Take 25 mg by mouth 3 (three) times daily.     ? warfarin (COUMADIN) 2 MG tablet Take 2 mg by mouth daily. $RemoveBefo'1mg'RVwgekbJrNt$  - sat, mon, wed ?$RemoveB'2mg'UwiAsJel$ - sun, tues, thurs, fri    ? acetaminophen (TYLENOL) 500 MG tablet Take 500 mg by mouth 2 (two) times a day. (Patient not taking: Reported on 02/01/2022)    ? ?No current facility-administered medications for this visit.  ? ? ? ?PHYSICAL EXAMINATION: ?ECOG PERFORMANCE STATUS: 1 - Symptomatic but completely ambulatory ?Vitals:  ? 02/01/22 1338  ?BP: (!) 151/83  ?Pulse: 71  ?Temp: 97.6 ?F (36.4 ?C)  ?SpO2: 98%  ? ?Filed Weights  ? 02/01/22 1338  ?Weight: 222 lb 6.4 oz (100.9 kg)  ? ? ?Physical Exam ?Constitutional:   ?   General: She is not in acute distress. ?   Comments: Patient ambulates independently  ?HENT:  ?   Head: Normocephalic and atraumatic.  ?Eyes:  ?   General: No scleral icterus. ?Cardiovascular:  ?   Rate and Rhythm: Normal rate and regular rhythm.  ?   Heart sounds: Normal heart sounds.  ?Pulmonary:  ?   Effort: Pulmonary effort is normal. No respiratory distress.  ?   Breath sounds: No wheezing.  ?Abdominal:  ?   General: Bowel sounds are normal. There is no distension.  ?   Palpations: Abdomen is soft.  ?Musculoskeletal:     ?   General: No deformity. Normal range of motion.  ?   Cervical back: Normal range of motion and neck supple.  ?Skin: ?   General:  Skin is warm and dry.  ?   Findings: No erythema or rash.  ?Neurological:  ?   Mental Status: She is alert and oriented to person, place, and time. Mental status is at baseline.  ?   Cranial Nerves: No cra

## 2022-02-04 ENCOUNTER — Ambulatory Visit: Payer: Self-pay

## 2022-02-16 ENCOUNTER — Encounter: Payer: Self-pay | Admitting: Emergency Medicine

## 2022-02-16 ENCOUNTER — Other Ambulatory Visit: Payer: Self-pay

## 2022-02-16 ENCOUNTER — Emergency Department: Payer: Medicare PPO

## 2022-02-16 ENCOUNTER — Inpatient Hospital Stay
Admission: EM | Admit: 2022-02-16 | Discharge: 2022-02-19 | DRG: 552 | Disposition: A | Discharge status: Home / Self Care | Payer: Medicare PPO | Attending: Internal Medicine | Admitting: Internal Medicine

## 2022-02-16 ENCOUNTER — Observation Stay (HOSPITAL_COMMUNITY): Admission: AD | Admit: 2022-02-16 | Payer: Medicare PPO | Admitting: Internal Medicine

## 2022-02-16 DIAGNOSIS — M810 Age-related osteoporosis without current pathological fracture: Secondary | ICD-10-CM | POA: Diagnosis present

## 2022-02-16 DIAGNOSIS — E785 Hyperlipidemia, unspecified: Secondary | ICD-10-CM | POA: Diagnosis present

## 2022-02-16 DIAGNOSIS — M7981 Nontraumatic hematoma of soft tissue: Secondary | ICD-10-CM | POA: Diagnosis present

## 2022-02-16 DIAGNOSIS — Z85820 Personal history of malignant melanoma of skin: Secondary | ICD-10-CM

## 2022-02-16 DIAGNOSIS — N3000 Acute cystitis without hematuria: Secondary | ICD-10-CM

## 2022-02-16 DIAGNOSIS — Z803 Family history of malignant neoplasm of breast: Secondary | ICD-10-CM | POA: Diagnosis not present

## 2022-02-16 DIAGNOSIS — D6832 Hemorrhagic disorder due to extrinsic circulating anticoagulants: Secondary | ICD-10-CM | POA: Diagnosis present

## 2022-02-16 DIAGNOSIS — N390 Urinary tract infection, site not specified: Secondary | ICD-10-CM | POA: Diagnosis present

## 2022-02-16 DIAGNOSIS — E669 Obesity, unspecified: Secondary | ICD-10-CM | POA: Diagnosis present

## 2022-02-16 DIAGNOSIS — N179 Acute kidney failure, unspecified: Secondary | ICD-10-CM | POA: Diagnosis present

## 2022-02-16 DIAGNOSIS — S329XXA Fracture of unspecified parts of lumbosacral spine and pelvis, initial encounter for closed fracture: Secondary | ICD-10-CM | POA: Diagnosis present

## 2022-02-16 DIAGNOSIS — E876 Hypokalemia: Secondary | ICD-10-CM | POA: Diagnosis present

## 2022-02-16 DIAGNOSIS — W010XXA Fall on same level from slipping, tripping and stumbling without subsequent striking against object, initial encounter: Secondary | ICD-10-CM | POA: Diagnosis present

## 2022-02-16 DIAGNOSIS — Z800 Family history of malignant neoplasm of digestive organs: Secondary | ICD-10-CM

## 2022-02-16 DIAGNOSIS — T148XXA Other injury of unspecified body region, initial encounter: Secondary | ICD-10-CM | POA: Diagnosis not present

## 2022-02-16 DIAGNOSIS — M25552 Pain in left hip: Secondary | ICD-10-CM | POA: Diagnosis present

## 2022-02-16 DIAGNOSIS — D62 Acute posthemorrhagic anemia: Secondary | ICD-10-CM | POA: Diagnosis present

## 2022-02-16 DIAGNOSIS — Z91041 Radiographic dye allergy status: Secondary | ICD-10-CM | POA: Diagnosis not present

## 2022-02-16 DIAGNOSIS — M109 Gout, unspecified: Secondary | ICD-10-CM | POA: Diagnosis present

## 2022-02-16 DIAGNOSIS — Z79899 Other long term (current) drug therapy: Secondary | ICD-10-CM

## 2022-02-16 DIAGNOSIS — K219 Gastro-esophageal reflux disease without esophagitis: Secondary | ICD-10-CM | POA: Diagnosis present

## 2022-02-16 DIAGNOSIS — I10 Essential (primary) hypertension: Secondary | ICD-10-CM | POA: Diagnosis present

## 2022-02-16 DIAGNOSIS — K589 Irritable bowel syndrome without diarrhea: Secondary | ICD-10-CM | POA: Diagnosis present

## 2022-02-16 DIAGNOSIS — Z7901 Long term (current) use of anticoagulants: Secondary | ICD-10-CM | POA: Diagnosis not present

## 2022-02-16 DIAGNOSIS — I951 Orthostatic hypotension: Secondary | ICD-10-CM | POA: Diagnosis present

## 2022-02-16 DIAGNOSIS — S32119A Unspecified Zone I fracture of sacrum, initial encounter for closed fracture: Secondary | ICD-10-CM | POA: Diagnosis present

## 2022-02-16 DIAGNOSIS — Z882 Allergy status to sulfonamides status: Secondary | ICD-10-CM | POA: Diagnosis not present

## 2022-02-16 DIAGNOSIS — Y92009 Unspecified place in unspecified non-institutional (private) residence as the place of occurrence of the external cause: Secondary | ICD-10-CM | POA: Diagnosis not present

## 2022-02-16 DIAGNOSIS — R188 Other ascites: Secondary | ICD-10-CM | POA: Diagnosis not present

## 2022-02-16 DIAGNOSIS — T45515A Adverse effect of anticoagulants, initial encounter: Secondary | ICD-10-CM | POA: Diagnosis present

## 2022-02-16 DIAGNOSIS — Z8042 Family history of malignant neoplasm of prostate: Secondary | ICD-10-CM

## 2022-02-16 DIAGNOSIS — Z888 Allergy status to other drugs, medicaments and biological substances status: Secondary | ICD-10-CM | POA: Diagnosis not present

## 2022-02-16 DIAGNOSIS — Z6830 Body mass index (BMI) 30.0-30.9, adult: Secondary | ICD-10-CM

## 2022-02-16 DIAGNOSIS — W19XXXA Unspecified fall, initial encounter: Principal | ICD-10-CM

## 2022-02-16 DIAGNOSIS — Z96642 Presence of left artificial hip joint: Secondary | ICD-10-CM | POA: Diagnosis present

## 2022-02-16 DIAGNOSIS — G4733 Obstructive sleep apnea (adult) (pediatric): Secondary | ICD-10-CM | POA: Diagnosis present

## 2022-02-16 DIAGNOSIS — Z808 Family history of malignant neoplasm of other organs or systems: Secondary | ICD-10-CM

## 2022-02-16 DIAGNOSIS — I480 Paroxysmal atrial fibrillation: Secondary | ICD-10-CM | POA: Diagnosis present

## 2022-02-16 DIAGNOSIS — S3289XA Fracture of other parts of pelvis, initial encounter for closed fracture: Secondary | ICD-10-CM | POA: Diagnosis not present

## 2022-02-16 DIAGNOSIS — S8012XA Contusion of left lower leg, initial encounter: Secondary | ICD-10-CM | POA: Diagnosis not present

## 2022-02-16 LAB — URINALYSIS, COMPLETE (UACMP) WITH MICROSCOPIC
Bilirubin Urine: NEGATIVE
Glucose, UA: NEGATIVE mg/dL
Hgb urine dipstick: NEGATIVE
Ketones, ur: NEGATIVE mg/dL
Nitrite: NEGATIVE
Protein, ur: NEGATIVE mg/dL
Specific Gravity, Urine: 1.014 (ref 1.005–1.030)
pH: 5 (ref 5.0–8.0)

## 2022-02-16 LAB — TROPONIN I (HIGH SENSITIVITY)
Troponin I (High Sensitivity): 9 ng/L (ref <18)
Troponin I (High Sensitivity): 10 ng/L (ref <18)

## 2022-02-16 LAB — PROTIME-INR
INR: 1.9 — ABNORMAL HIGH (ref 0.8–1.2)
Prothrombin Time: 21.3 seconds — ABNORMAL HIGH (ref 11.4–15.2)

## 2022-02-16 LAB — BASIC METABOLIC PANEL
Anion gap: 8 (ref 5–15)
BUN: 19 mg/dL (ref 8–23)
CO2: 29 mmol/L (ref 22–32)
Calcium: 9.7 mg/dL (ref 8.9–10.3)
Chloride: 102 mmol/L (ref 98–111)
Creatinine, Ser: 1.22 mg/dL — ABNORMAL HIGH (ref 0.44–1.00)
GFR, Estimated: 45 mL/min — ABNORMAL LOW (ref >60)
Glucose, Bld: 137 mg/dL — ABNORMAL HIGH (ref 70–99)
Potassium: 3.4 mmol/L — ABNORMAL LOW (ref 3.5–5.1)
Sodium: 139 mmol/L (ref 135–145)

## 2022-02-16 LAB — CBC
HCT: 37.2 % (ref 36.0–46.0)
Hemoglobin: 11.6 g/dL — ABNORMAL LOW (ref 12.0–15.0)
MCH: 28.6 pg (ref 26.0–34.0)
MCHC: 31.2 g/dL (ref 30.0–36.0)
MCV: 91.6 fL (ref 80.0–100.0)
Platelets: 235 10*3/uL (ref 150–400)
RBC: 4.06 MIL/uL (ref 3.87–5.11)
RDW: 12.8 % (ref 11.5–15.5)
WBC: 9 10*3/uL (ref 4.0–10.5)
nRBC: 0 % (ref 0.0–0.2)

## 2022-02-16 MED ORDER — LIDOCAINE 5 % EX PTCH
1.0000 | MEDICATED_PATCH | CUTANEOUS | Status: DC
  Administered 2022-02-16: 1 via TRANSDERMAL
  Filled 2022-02-16 (×2): qty 1

## 2022-02-16 MED ORDER — SODIUM CHLORIDE 0.9 % IV SOLN
1.0000 g | Freq: Once | INTRAVENOUS | Status: AC
  Administered 2022-02-16: 1 g via INTRAVENOUS
  Filled 2022-02-16: qty 10

## 2022-02-16 MED ORDER — LACTATED RINGERS IV BOLUS
1000.0000 mL | Freq: Once | INTRAVENOUS | Status: AC
  Administered 2022-02-16: 1000 mL via INTRAVENOUS

## 2022-02-16 MED ORDER — POTASSIUM CHLORIDE CRYS ER 20 MEQ PO TBCR
40.0000 meq | EXTENDED_RELEASE_TABLET | Freq: Once | ORAL | Status: AC
  Administered 2022-02-16: 40 meq via ORAL
  Filled 2022-02-16: qty 2

## 2022-02-16 MED ORDER — ACETAMINOPHEN 500 MG PO TABS
1000.0000 mg | ORAL_TABLET | Freq: Once | ORAL | Status: AC
  Administered 2022-02-16: 1000 mg via ORAL
  Filled 2022-02-16: qty 2

## 2022-02-16 MED ORDER — PROTHROMBIN COMPLEX CONC HUMAN 500 UNITS IV KIT
2500.0000 [IU] | PACK | Status: AC
  Administered 2022-02-16: 2500 [IU] via INTRAVENOUS
  Filled 2022-02-16: qty 2000

## 2022-02-16 MED ORDER — VITAMIN K1 10 MG/ML IJ SOLN
10.0000 mg | INTRAVENOUS | Status: AC
  Administered 2022-02-16: 10 mg via INTRAVENOUS
  Filled 2022-02-16: qty 1

## 2022-02-16 NOTE — ED Notes (Signed)
Pt to xray at this time.

## 2022-02-16 NOTE — ED Notes (Signed)
This RN and Dr. Tamala Julian secure pt's pelvic by pelvic binder used by a sheet and forceps to secure binder.  ?

## 2022-02-16 NOTE — ED Triage Notes (Signed)
Pt to ED from home with daughter c/o fall that happened this morning around 0300 while in the bathroom.  States was standing up from toilet and unsure exactly what happened but did feel lightheaded.  Denies hitting head or LOC.  Patient has been able to bear weight and walk but later this evening pain has gotten worse.  Hx of blood thinner and heart ablation, hx of left hip replacement. ?

## 2022-02-16 NOTE — ED Notes (Signed)
Called [facility]) for trauma consult per Charlann Boxer, MD ?

## 2022-02-16 NOTE — ED Provider Notes (Addendum)
? ?[facility] ?Provider Note ? ? ? Event Date/Time  ? First MD Initiated Contact with Patient 02/16/22 2015   ?  (approximate) ? ? ?History  ? ?Fall ? ? ?HPI ? ?Sarah Riddle is a 81 y.o. female with a past medical history of A-fib on Coumadin, GERD, HTN, HDL, IBS, OSA and osteoporosis who presents coming by her daughter for evaluation of some left hip pain its been bothering her all day since she had a, will fall around 3 AM.  She states has been walking on it but has been quite sore and her daughter wanted her to get checked out.  She states she was walking in her home last night when she thinks she tripped or slipped falling onto her left hip.  She is adamant that she did not have loss of consciousness hit her head or neck and has no headache, neck pain, back pain, chest pain, abdominal pain or any other extremity pain other than the left hip.  She otherwise has not had any other recent falls or injuries.  All she remembers is feeling slightly lightheaded earlier in the day.  No fevers, chills, cough, nausea, vomiting, diarrhea, rash or other sick symptoms.  No medication changes.  No recent ethanol use.   ?  ?Past Medical History:  ?Diagnosis Date  ? A-fib ([place])   ? Age related osteoporosis   ? Cancer [facility])   ? Melanoma x 2 -- Lt Leg -1976 Rt Arm - 2014  ? Chronic bilateral low back pain   ? Chronic kidney disease   ? Family history of breast cancer   ? Family history of colon cancer   ? Family history of melanoma   ? Family history of prostate cancer   ? Fibrocystic breast disease   ? GERD (gastroesophageal reflux disease)   ? Hyperlipidemia   ? Hypertension   ? IBS (irritable bowel syndrome)   ? Lumbago   ? Neuropathy   ? Obstructive sleep apnea   ? Osteoarthritis of shoulder   ? ? ? ?Physical Exam  ?Triage Vital Signs: ?ED Triage Vitals  ?Enc Vitals Group  ?   BP 02/16/22 2003 104/89  ?   Pulse Rate 02/16/22 2003 87  ?   Resp 02/16/22 2003 16  ?   Temp 02/16/22 2003 98.7 ?F (37.1 ?C)   ?   Temp Source 02/16/22 2003 Oral  ?   SpO2 02/16/22 2003 99 %  ?   Weight 02/16/22 2004 220 lb (99.8 kg)  ?   Height 02/16/22 2004 '5\' 11"'$  (1.803 m)  ?   Head Circumference --   ?   Peak Flow --   ?   Pain Score 02/16/22 2004 5  ?   Pain Loc --   ?   Pain Edu? --   ?   Excl. in [place]? --   ? ? ?Most recent vital signs: ?Vitals:  ? 02/16/22 2200 02/16/22 2230  ?BP: (!) 106/57 (!) 119/57  ?Pulse: 69 68  ?Resp:    ?Temp:    ?SpO2: 99% 99%  ? ? ?General: Awake, no distress.  ?CV:  Good peripheral perfusion.  2+ bilateral radial and PT pulses. ?Resp:  Normal effort.  Clear bilaterally ?Abd:  No distention.  Soft. ?Other:  Cranial nerves II through XII are grossly intact.  There is no finger dysmetria or pronator drift.  Patient has full symmetric strength of the bilateral upper extremities and lower extremities although she does  have some discomfort on ranging of the left hip.  There is ecchymosis and apparent hematoma over the lateral aspect of the left hip.  No overlying other skin changes noted.  She has no overlying skin changes diffusion deformity or pain at the bilateral ankles, knees, right hip, bilateral wrists, elbows or shoulders. ? ? ?ED Results / Procedures / Treatments  ?Labs ?(all labs ordered are listed, but only abnormal results are displayed) ?Labs Reviewed  ?CBC - Abnormal; Notable for the following components:  ?    Result Value  ? Hemoglobin 11.6 (*)   ? All other components within normal limits  ?BASIC METABOLIC PANEL - Abnormal; Notable for the following components:  ? Potassium 3.4 (*)   ? Glucose, Bld 137 (*)   ? Creatinine, Ser 1.22 (*)   ? GFR, Estimated 45 (*)   ? All other components within normal limits  ?PROTIME-INR - Abnormal; Notable for the following components:  ? Prothrombin Time 21.3 (*)   ? INR 1.9 (*)   ? All other components within normal limits  ?URINALYSIS, COMPLETE (UACMP) WITH MICROSCOPIC - Abnormal; Notable for the following components:  ? Color, Urine YELLOW (*)   ? APPearance  HAZY (*)   ? Leukocytes,Ua LARGE (*)   ? Bacteria, UA RARE (*)   ? All other components within normal limits  ?URINE CULTURE  ?TYPE AND SCREEN  ?TROPONIN I (HIGH SENSITIVITY)  ?TROPONIN I (HIGH SENSITIVITY)  ? ? ? ?EKG ? ?ECG is remarkable for sinus rhythm with a ventricular rate of 88, normal axis with nonspecific ST change in V2 without any other clear evidence of acute ischemia or significant arrhythmia.  Unremarkable intervals. ? ? ?RADIOLOGY ? ?X-ray of the left hip on my interpretation shows previous total left hip arthroplasty without evidence of acute fracture.  I also reviewed radiology's interpretation. ? ?CT left hip on my interpretation shows a fairly large left hematoma.  I do not see any clear acute fractures.  I reviewed radiology interpretation and agree to findings of large subcutaneous left-sided gluteal hip hematoma that measured 3.5 x 15.8 x 7.5 cm.  They note possible sacral insufficiency fractures and possible subacute bilateral sacral alar fractures. ? ? ?PROCEDURES: ? ?Critical Care performed: Yes, see critical care procedure note(s) ? ?.Critical Care ?Performed by: Lucrezia Starch, MD ?Authorized by: Lucrezia Starch, MD  ? ?Critical care provider statement:  ?  Critical care time (minutes):  30 ?  Critical care was necessary to treat or prevent imminent or life-threatening deterioration of the following conditions:  Trauma ?  Critical care was time spent personally by me on the following activities:  Development of treatment plan with patient or surrogate, discussions with consultants, evaluation of patient's response to treatment, examination of patient, ordering and review of laboratory studies, ordering and review of radiographic studies, ordering and performing treatments and interventions, pulse oximetry, re-evaluation of patient's condition and review of old charts ?.1-3 Lead EKG Interpretation ?Performed by: Lucrezia Starch, MD ?Authorized by: Lucrezia Starch, MD  ? ?   Interpretation: normal   ?  ECG rate assessment: normal   ?  Rhythm: sinus rhythm   ?  Ectopy: none   ?  Conduction: normal   ? ?The patient is on the cardiac monitor to evaluate for evidence of arrhythmia and/or significant heart rate changes. ? ? ?MEDICATIONS ORDERED IN ED: ?Medications  ?lidocaine (LIDODERM) 5 % 1 patch (1 patch Transdermal Patch Applied 02/16/22 2108)  ?cefTRIAXone (ROCEPHIN) 1  g in sodium chloride 0.9 % 100 mL IVPB (1 g Intravenous New Bag/Given 02/16/22 2301)  ?prothrombin complex conc human (KCENTRA) IVPB 2,500 Units (has no administration in time range)  ?phytonadione (VITAMIN K) 10 mg in dextrose 5 % 50 mL IVPB (has no administration in time range)  ?lactated ringers bolus 1,000 mL (1,000 mLs Intravenous New Bag/Given 02/16/22 2114)  ?acetaminophen (TYLENOL) tablet 1,000 mg (1,000 mg Oral Given 02/16/22 2233)  ?potassium chloride SA (KLOR-CON M) CR tablet 40 mEq (40 mEq Oral Given 02/16/22 2250)  ? ? ? ?IMPRESSION / MDM / ASSESSMENT AND PLAN / ED COURSE  ?I reviewed the triage vital signs and the nursing notes. ?             ?               ? ?Differential diagnosis includes, but is not limited to fall secondary to an arrhythmia, anemia, metabolic derangement, dehydration cystitis possibly with patient sustaining a hematoma, contusion or underlying hip fracture.  I have a low suspicion for other significant trauma at this point. ? ?ECG is remarkable for sinus rhythm with a ventricular rate of 88, normal axis with nonspecific ST change in V2 without any other clear evidence of acute ischemia or significant arrhythmia.  Unremarkable intervals.  Nonelevated troponin is not suggestive of ACS. ? ?X-ray of the left hip on my interpretation shows previous total left hip arthroplasty without evidence of acute fracture.  I also reviewed radiology's interpretation. ? ?CT left hip on my interpretation shows a fairly large left hematoma.  I do not see any clear acute fractures.  I reviewed radiology  interpretation and agree to findings of large subcutaneous left-sided gluteal hip hematoma that measured 3.5 x 15.8 x 7.5 cm.  They note possible sacral insufficiency fractures and possible subacute bilateral sacral a

## 2022-02-17 ENCOUNTER — Encounter: Payer: Self-pay | Admitting: Internal Medicine

## 2022-02-17 DIAGNOSIS — T148XXA Other injury of unspecified body region, initial encounter: Secondary | ICD-10-CM

## 2022-02-17 DIAGNOSIS — I10 Essential (primary) hypertension: Secondary | ICD-10-CM | POA: Diagnosis not present

## 2022-02-17 DIAGNOSIS — E876 Hypokalemia: Secondary | ICD-10-CM | POA: Diagnosis not present

## 2022-02-17 DIAGNOSIS — N390 Urinary tract infection, site not specified: Secondary | ICD-10-CM | POA: Insufficient documentation

## 2022-02-17 DIAGNOSIS — D62 Acute posthemorrhagic anemia: Secondary | ICD-10-CM | POA: Diagnosis present

## 2022-02-17 DIAGNOSIS — I480 Paroxysmal atrial fibrillation: Secondary | ICD-10-CM | POA: Diagnosis not present

## 2022-02-17 DIAGNOSIS — W19XXXA Unspecified fall, initial encounter: Secondary | ICD-10-CM

## 2022-02-17 DIAGNOSIS — E785 Hyperlipidemia, unspecified: Secondary | ICD-10-CM | POA: Diagnosis not present

## 2022-02-17 DIAGNOSIS — N3000 Acute cystitis without hematuria: Secondary | ICD-10-CM

## 2022-02-17 DIAGNOSIS — N179 Acute kidney failure, unspecified: Secondary | ICD-10-CM | POA: Diagnosis not present

## 2022-02-17 DIAGNOSIS — S329XXA Fracture of unspecified parts of lumbosacral spine and pelvis, initial encounter for closed fracture: Secondary | ICD-10-CM | POA: Diagnosis present

## 2022-02-17 LAB — TYPE AND SCREEN
ABO/RH(D): O POS
Antibody Screen: NEGATIVE

## 2022-02-17 LAB — CBC
HCT: 34 % — ABNORMAL LOW (ref 36.0–46.0)
HCT: 32.6 % — ABNORMAL LOW (ref 36.0–46.0)
Hemoglobin: 10.8 g/dL — ABNORMAL LOW (ref 12.0–15.0)
Hemoglobin: 10.5 g/dL — ABNORMAL LOW (ref 12.0–15.0)
MCH: 29.1 pg (ref 26.0–34.0)
MCH: 29.2 pg (ref 26.0–34.0)
MCHC: 31.8 g/dL (ref 30.0–36.0)
MCHC: 32.2 g/dL (ref 30.0–36.0)
MCV: 91.6 fL (ref 80.0–100.0)
MCV: 90.6 fL (ref 80.0–100.0)
Platelets: 200 10*3/uL (ref 150–400)
Platelets: 201 10*3/uL (ref 150–400)
RBC: 3.71 MIL/uL — ABNORMAL LOW (ref 3.87–5.11)
RBC: 3.6 MIL/uL — ABNORMAL LOW (ref 3.87–5.11)
RDW: 12.7 % (ref 11.5–15.5)
RDW: 12.6 % (ref 11.5–15.5)
WBC: 8.1 10*3/uL (ref 4.0–10.5)
WBC: 8.4 10*3/uL (ref 4.0–10.5)
nRBC: 0 % (ref 0.0–0.2)
nRBC: 0 % (ref 0.0–0.2)

## 2022-02-17 LAB — PROTIME-INR
INR: 1.1 (ref 0.8–1.2)
Prothrombin Time: 14.1 seconds (ref 11.4–15.2)

## 2022-02-17 LAB — HEMOGLOBIN AND HEMATOCRIT, BLOOD
HCT: 32 % — ABNORMAL LOW (ref 36.0–46.0)
HCT: 32.4 % — ABNORMAL LOW (ref 36.0–46.0)
Hemoglobin: 9.9 g/dL — ABNORMAL LOW (ref 12.0–15.0)
Hemoglobin: 10.3 g/dL — ABNORMAL LOW (ref 12.0–15.0)

## 2022-02-17 LAB — APTT: aPTT: 39 seconds — ABNORMAL HIGH (ref 24–36)

## 2022-02-17 LAB — MAGNESIUM: Magnesium: 1.9 mg/dL (ref 1.7–2.4)

## 2022-02-17 MED ORDER — GABAPENTIN 300 MG PO CAPS
300.0000 mg | ORAL_CAPSULE | Freq: Three times a day (TID) | ORAL | Status: DC
  Administered 2022-02-17 – 2022-02-19 (×6): 300 mg via ORAL
  Filled 2022-02-17 (×6): qty 1

## 2022-02-17 MED ORDER — ALLOPURINOL 100 MG PO TABS
100.0000 mg | ORAL_TABLET | Freq: Every day | ORAL | Status: DC
  Administered 2022-02-18 – 2022-02-19 (×2): 100 mg via ORAL
  Filled 2022-02-17 (×3): qty 1

## 2022-02-17 MED ORDER — OXYCODONE-ACETAMINOPHEN 5-325 MG PO TABS: 1.0000 | ORAL_TABLET | ORAL | Status: DC | PRN

## 2022-02-17 MED ORDER — FLUTICASONE PROPIONATE 50 MCG/ACT NA SUSP
2.0000 | Freq: Every day | NASAL | Status: DC
  Administered 2022-02-17 – 2022-02-19 (×3): 2 via NASAL
  Filled 2022-02-17: qty 16

## 2022-02-17 MED ORDER — MORPHINE SULFATE (PF) 2 MG/ML IV SOLN: 0.5000 mg | INTRAVENOUS | Status: DC | PRN

## 2022-02-17 MED ORDER — EZETIMIBE 10 MG PO TABS
10.0000 mg | ORAL_TABLET | Freq: Every day | ORAL | Status: DC
Start: 2022-02-17 — End: 2022-02-19
  Administered 2022-02-17 – 2022-02-19 (×3): 10 mg via ORAL
  Filled 2022-02-17 (×3): qty 1

## 2022-02-17 MED ORDER — CRANBERRY 125 MG PO TABS: ORAL_TABLET | Freq: Every day | ORAL | Status: DC

## 2022-02-17 MED ORDER — SODIUM CHLORIDE 0.9 % IV BOLUS
1000.0000 mL | Freq: Once | INTRAVENOUS | Status: AC
  Administered 2022-02-17: 1000 mL via INTRAVENOUS

## 2022-02-17 MED ORDER — ROSUVASTATIN CALCIUM 10 MG PO TABS
40.0000 mg | ORAL_TABLET | Freq: Every day | ORAL | Status: DC
  Administered 2022-02-17 – 2022-02-19 (×3): 40 mg via ORAL
  Filled 2022-02-17 (×3): qty 4

## 2022-02-17 MED ORDER — SODIUM CHLORIDE 0.9 % IV SOLN
1.0000 g | INTRAVENOUS | Status: DC
  Administered 2022-02-17: 1 g via INTRAVENOUS
  Filled 2022-02-17: qty 1

## 2022-02-17 MED ORDER — METOPROLOL SUCCINATE ER 25 MG PO TB24
25.0000 mg | ORAL_TABLET | Freq: Every day | ORAL | Status: DC
  Administered 2022-02-17: 25 mg via ORAL
  Filled 2022-02-17 (×2): qty 1

## 2022-02-17 MED ORDER — HYDRALAZINE HCL 20 MG/ML IJ SOLN: 5.0000 mg | INTRAMUSCULAR | Status: DC | PRN

## 2022-02-17 MED ORDER — ACETAMINOPHEN 325 MG PO TABS
650.0000 mg | ORAL_TABLET | Freq: Four times a day (QID) | ORAL | Status: DC
  Administered 2022-02-17: 650 mg via ORAL
  Filled 2022-02-17: qty 2

## 2022-02-17 MED ORDER — AMITRIPTYLINE HCL 25 MG PO TABS
50.0000 mg | ORAL_TABLET | Freq: Every day | ORAL | Status: DC
  Administered 2022-02-17 – 2022-02-18 (×2): 50 mg via ORAL
  Filled 2022-02-17 (×2): qty 2

## 2022-02-17 MED ORDER — ACETAMINOPHEN 325 MG PO TABS: 650.0000 mg | ORAL_TABLET | Freq: Four times a day (QID) | ORAL | Status: DC | PRN

## 2022-02-17 MED ORDER — LORATADINE 10 MG PO TABS
10.0000 mg | ORAL_TABLET | Freq: Every day | ORAL | Status: DC
  Administered 2022-02-17 – 2022-02-18 (×2): 10 mg via ORAL
  Filled 2022-02-17 (×3): qty 1

## 2022-02-17 MED ORDER — ONDANSETRON HCL 4 MG/2ML IJ SOLN
4.0000 mg | Freq: Three times a day (TID) | INTRAMUSCULAR | Status: DC | PRN
Start: 2022-02-17 — End: 2022-02-19

## 2022-02-17 MED ORDER — METHOCARBAMOL 500 MG PO TABS: 500.0000 mg | ORAL_TABLET | Freq: Three times a day (TID) | ORAL | Status: DC | PRN

## 2022-02-17 MED ORDER — SERTRALINE HCL 50 MG PO TABS
25.0000 mg | ORAL_TABLET | Freq: Three times a day (TID) | ORAL | Status: DC
  Administered 2022-02-17 – 2022-02-19 (×5): 25 mg via ORAL
  Filled 2022-02-17 (×5): qty 1

## 2022-02-17 MED ORDER — PANTOPRAZOLE SODIUM 40 MG PO TBEC
40.0000 mg | DELAYED_RELEASE_TABLET | Freq: Every day | ORAL | Status: DC
  Administered 2022-02-18 – 2022-02-19 (×2): 40 mg via ORAL
  Filled 2022-02-17 (×3): qty 1

## 2022-02-17 MED ORDER — ISOSORBIDE MONONITRATE ER 30 MG PO TB24
60.0000 mg | ORAL_TABLET | Freq: Every day | ORAL | Status: DC
  Administered 2022-02-17 – 2022-02-19 (×2): 60 mg via ORAL
  Filled 2022-02-17 (×3): qty 2

## 2022-02-17 NOTE — ED Notes (Signed)
CALLED [facility] TO GET UPDATE ON BED STATUS. PER TARA AT [facility] NO BED AVAILABLE WITH [facility] ON HOLD FOR BEDS. MANDIE, RN AND SUNG, MD MADE AWARE OF THIS. ?

## 2022-02-17 NOTE — Assessment & Plan Note (Signed)
-   Hold Lasix and Cozaar ?-IV fluid: 1 L normal saline and 1 L LR ?

## 2022-02-17 NOTE — ED Notes (Signed)
Pt continues to rest, even RR and unlabored, NAD noted, call bell in reach, side rails up x2 for safety, care on going, will continue to monitor.  ?

## 2022-02-17 NOTE — ED Notes (Signed)
Pt appears to be sleeping, even RR and unlabored, NAD noted, call bell in reach, side rails up x2 for safety, care on going, will continue to monitor. 

## 2022-02-17 NOTE — ED Provider Notes (Signed)
----------------------------------------- ?  1:18 PM on 02/17/2022 ?----------------------------------------- ?Patient care assumed from Dr. Beather Arbour.  Patient has now been in the emergency department over 17 hours.  They are not able to give [place] an estimate of when a bed would be available for the patient.  Here the patient appears well.  I reviewed her work-up her CT scan shows subacute ala fractures.  Patient does have a hematoma although her INR is now 1.1 and her H&H is increased on last check.  We will recheck a 6-hour H&H at 3:30 PM.  I spoke to Dr. Harlow Mares of orthopedic surgery as well as Dr. Cari Caraway of neurosurgery regarding the patient's bilateral sacral ala fractures.  Both of them agree that the patient does not require an operation and would be weightbearing as tolerated.  At this point I do not see the need to transfer the patient to a trauma center given her reassuring work-up and significant amount of time in the emergency department with no decline and in fact improvement of her labs.  We will remove the patient's pelvic binder.  We will have PT evaluate to see how the patient does on her feet to see if she is able to safely ambulate.  We will repeat an H&H at 3:30 PM to ensure that it is stable.  As long as the patient's repeat H&H remained stable and she is able to ambulate patient could potentially be discharged home.  If the patient is not able to safely ambulate patient could either be admitted to the hospital service for pain control or possibly have social work involved for placement into a rehab center.  Patient does strongly wish to go home if possible. ? ?Patient cannot tolerate attempted ambulation.  Became very lightheaded and felt like she was going to pass out.  Patient is orthostatic shows blood pressure drop with sitting up and standing.  We will IV hydrate.  We will admit to the hospitalist service for further monitoring and work-up.  Patient agreeable to plan. ?  Harvest Dark,  MD ?02/17/22 1503 ? ?

## 2022-02-17 NOTE — ED Notes (Signed)
[place] ortho department, currently does not have a bed assignment for pt at this time, will continue to monitor.  ?

## 2022-02-17 NOTE — Assessment & Plan Note (Deleted)
-  IV rocephin -f/u Urine cultuer

## 2022-02-17 NOTE — ED Notes (Signed)
Pt continues to sleep, even RR and unlabored, NAD noted, call bell in reach, side rails up x2 for safety, care on going, will continue to monitor.  

## 2022-02-17 NOTE — ED Notes (Signed)
Pt. Resting, laying flat in bed. Pt. Is conversational and pleasant. More warm blankets provided. Pt. Encouraged to urinate, assured purwick in correct position. ?

## 2022-02-17 NOTE — Assessment & Plan Note (Signed)
-  prn morphine, Percocet, Robaxin, Tylenol ?

## 2022-02-17 NOTE — Plan of Care (Signed)
?  Problem: Education: ?Goal: Knowledge of General Education information will improve ?Description: Including pain rating scale, medication(s)/side effects and non-pharmacologic comfort measures ?Outcome: Progressing ?  ?Problem: Health Behavior/Discharge Planning: ?Goal: Ability to manage health-related needs will improve ?Outcome: Progressing ?  ?Problem: Clinical Measurements: ?Goal: Will remain free from infection ?Outcome: Progressing ?  ?Problem: Elimination: ?Goal: Will not experience complications related to bowel motility ?Outcome: Progressing ?Goal: Will not experience complications related to urinary retention ?Outcome: Progressing ?  ?

## 2022-02-17 NOTE — ED Notes (Signed)
Called [facility] spoke to Lauren to check on status, patient is on wait list for bed not sure when bed available  [phone] ?

## 2022-02-17 NOTE — ED Notes (Signed)
Orthostatic VS ?Laying 161/75 ?Sitting 96/73 ?Standing 93/62 ? ?Pt. Reports slight dizziness when looking to left while standing.  ?Dr. Kerman Passey notified. ?

## 2022-02-17 NOTE — ED Notes (Signed)
PT WAS ACCEPTED TO [facility] PER TARA AND Z. SMITH,MD. AWAITING BED ASSIGNMENT ?

## 2022-02-17 NOTE — ED Provider Notes (Signed)
----------------------------------------- ?  6:20 AM on 02/17/2022 ?-----------------------------------------  ? ?Patient remains in the ED awaiting transfer to [facility].  Repeat H/H stable but slightly decreased from prior; may be dilutional effect.  Will repeat 6-hour H/H around 9:30 AM.  Care will be transferred to the oncoming provider at change of shift. ?  ?Paulette Blanch, MD ?02/17/22 [phone] ? ?

## 2022-02-17 NOTE — ED Notes (Signed)
Called [facility] spoke to [place] to cancel bed request as patient has been re evaluated by Dr. Kerman Passey with neuro surgery and ortho consults. Patient will be discharged ?

## 2022-02-17 NOTE — Assessment & Plan Note (Signed)
-   IV hydralazine as needed ?-Hold Cozaar due to AKI ?-Continue metoprolol ?

## 2022-02-17 NOTE — ED Notes (Signed)
PT visitor to nurses station. Asking when pt will be transferred, why they are being transferred and any updates.  This nurse informed visitor that they are not beds available as of right now and the patient is still waiting on a bed to be transferred. Pt still wanting updates. MD made aware at this time ?

## 2022-02-17 NOTE — Assessment & Plan Note (Signed)
-  see above 

## 2022-02-17 NOTE — Evaluation (Signed)
Physical Therapy Evaluation ?Patient Details ?Name: Sarah Riddle ?MRN: [id number] ?DOB: 25-Mar-1941 ?Today's Date: 02/17/2022 ? ?History of Present Illness ? Pt is an 81 y.o. female with a past medical history of A-fib on Coumadin, GERD, HTN, HDL, IBS, OSA and osteoporosis who presents coming by her daughter for evaluation of some left hip pain s/p fall.  Per radiaology report: No evidence of acute fracture or dislocation, subacute bilateral sacral ala fractures, possibly sacral insufficiency fractures, and large subcutaneous hematoma in the left gluteal region. ?  ?Clinical Impression ? Pt was pleasant and motivated to participate during the session and put forth good effort throughout.  Pt's orthostatic BPs as follows: Supine 157/63, HR 90; Seated 142/65, HR 87; Standing 102/55, HR 70. Pt was asymptomatic initially in standing and participated in standing therex at EOB waiting for 3 min to pass to get 3 min standing BP but pt became symptomatic with dizziness just prior to reaching 3 min and needed to return to sitting.  Seated BP taken immediately upon returning to sitting at 136/70, HR 84 with symptoms persisting.  Pt reported feeling weaker than at baseline but overall perform functional tasks with good effort, control and stability but amb was deferred for pt safety secondary to hypotension. Will recommend HHPT upon discharge to safely address deficits listed in patient problem list for decreased caregiver assistance and eventual return to PLOF. ? ?   ?   ? ?Recommendations for follow up therapy are one component of a multi-disciplinary discharge planning process, led by the attending physician.  Recommendations may be updated based on patient status, additional functional criteria and insurance authorization. ? ?Follow Up Recommendations Home health PT ? ?  ?Assistance Recommended at Discharge Frequent or constant Supervision/Assistance  ?Patient can return home with the following ? A little help with  walking and/or transfers;A little help with bathing/dressing/bathroom;Assistance with cooking/housework;Assist for transportation;Help with stairs or ramp for entrance ? ?  ?Equipment Recommendations None recommended by PT  ?Recommendations for Other Services ?    ?  ?Functional Status Assessment Patient has had a recent decline in their functional status and demonstrates the ability to make significant improvements in function in a reasonable and predictable amount of time.  ? ?  ?Precautions / Restrictions Precautions ?Precautions: Fall ?Restrictions ?Weight Bearing Restrictions: No  ? ?  ? ?Mobility ? Bed Mobility ?Overal bed mobility: Modified Independent ?  ?  ?  ?  ?  ?  ?General bed mobility comments: Extra time and effort only ?  ? ?Transfers ?Overall transfer level: Needs assistance ?Equipment used: Rolling walker (2 wheels) ?Transfers: Sit to/from Stand ?Sit to Stand: Min guard, From elevated surface ?  ?  ?  ?  ?  ?General transfer comment: Good control and stability with min verbal cues for hand placement ?  ? ?Ambulation/Gait ?  ?  ?  ?  ?  ?  ?  ?General Gait Details: Amb deferred secondary to orthostatic hypotension ? ?Stairs ?  ?  ?  ?  ?  ? ?Wheelchair Mobility ?  ? ?Modified Rankin (Stroke Patients Only) ?  ? ?  ? ?Balance Overall balance assessment: No apparent balance deficits (not formally assessed) ?  ?  ?  ?  ?  ?  ?  ?  ?  ?  ?  ?  ?  ?  ?  ?  ?  ?  ?   ? ? ? ?Pertinent Vitals/Pain Pain Assessment ?Pain Assessment: No/denies pain  ? ? ?  Home Living Family/patient expects to be discharged to:: Private residence ?Living Arrangements: Spouse/significant other;Children ?Available Help at Discharge: Family;Available 24 hours/day ?Type of Home: House ?Home Access: Stairs to enter ?Entrance Stairs-Rails: Right ?Entrance Stairs-Number of Steps: 1 ?  ?Home Layout: One level ?Home Equipment: Advice worker (2 wheels);BSC/3in1;Grab bars - tub/shower ?Additional Comments: Pt reports living  with her spouse and her daughter who is a CNA that works nights. Spouse unable to provide physical assistance and does not drive.  ?  ?Prior Function   ?  ?  ?  ?  ?  ?  ?Mobility Comments: Ind amb without an AD, no other fall history other than fall associated with this admission ?ADLs Comments: Ind with ADLs ?  ? ? ?Hand Dominance  ?   ? ?  ?Extremity/Trunk Assessment  ? Upper Extremity Assessment ?Upper Extremity Assessment: Overall WFL for tasks assessed ?  ? ?Lower Extremity Assessment ?Lower Extremity Assessment: Generalized weakness ?  ? ?   ?Communication  ? Communication: No difficulties  ?Cognition Arousal/Alertness: Awake/alert ?Behavior During Therapy: [facility] for tasks assessed/performed ?Overall Cognitive Status: Within Functional Limits for tasks assessed ?  ?  ?  ?  ?  ?  ?  ?  ?  ?  ?  ?  ?  ?  ?  ?  ?  ?  ?  ? ?  ?General Comments   ? ?  ?Exercises Total Joint Exercises ?Ankle Circles/Pumps: AROM, Strengthening, Both, 10 reps ?Towel Squeeze: Strengthening, Both, 10 reps ?Long [employer]: Strengthening, Both, 10 reps, 15 reps ?Knee Flexion: Strengthening, Both, 10 reps, 15 reps ?Marching in Standing: AROM, Strengthening, Both, 5 reps, 10 reps, Seated, Standing ?Other Exercises ?Other Exercises: Standing weight shifting left/right x 10 ?Other Exercises: Low amplitude mini squats 2 x 5 ?Other Exercises: Standing heel raises 2 x 5  ? ?Assessment/Plan  ?  ?PT Assessment Patient needs continued PT services  ?PT Problem List Decreased strength;Decreased activity tolerance;Decreased mobility;Decreased knowledge of use of DME ? ?   ?  ?PT Treatment Interventions DME instruction;Gait training;Stair training;Functional mobility training;Therapeutic activities;Therapeutic exercise;Balance training;Patient/family education   ? ?PT Goals (Current goals can be found in the Care Plan section)  ?Acute Rehab PT Goals ?Patient Stated Goal: To get stronger and return home ?PT Goal Formulation: With patient ?Time For Goal  Achievement: 03/02/22 ?Potential to Achieve Goals: Good ? ?  ?Frequency Min 2X/week ?  ? ? ?Co-evaluation   ?  ?  ?  ?  ? ? ?  ?AM-PAC PT "6 Clicks" Mobility  ?Outcome Measure Help needed turning from your back to your side while in a flat bed without using bedrails?: None ?Help needed moving from lying on your back to sitting on the side of a flat bed without using bedrails?: None ?Help needed moving to and from a bed to a chair (including a wheelchair)?: A Little ?Help needed standing up from a chair using your arms (e.g., wheelchair or bedside chair)?: A Little ?Help needed to walk in hospital room?: A Little ?Help needed climbing 3-5 steps with a railing? : A Lot ?6 Click Score: 19 ? ?  ?End of Session Equipment Utilized During Treatment: Gait belt ?Activity Tolerance: Treatment limited secondary to medical complications (Comment) (orthostatic hypotension) ?Patient left: in bed;with call bell/phone within reach;with family/visitor present ?Nurse Communication: Mobility status;Other (comment) (MD notified of orthostatic BPs) ?PT Visit Diagnosis: History of falling (Z91.81);Muscle weakness (generalized) (M62.81);Difficulty in walking, not elsewhere classified (R26.2) ?  ? ?Time:  2993-7169 ?PT Time Calculation (min) (ACUTE ONLY): 31 min ? ? ?Charges:   PT Evaluation ?$PT Eval Moderate Complexity: 1 Mod ?PT Treatments ?$Therapeutic Activity: 8-22 mins ?  ?   ? ?D. Royetta Asal PT, DPT ?02/17/22, 3:16 PM ? ? ? ?

## 2022-02-17 NOTE — H&P (Signed)
?History and Physical  ? ? ?Sarah Riddle:[id number] DOB: Dec 10, 1940 DOA: 02/16/2022 ? ?Referring MD/NP/PA:  ? ?PCP: Perrin Maltese, MD  ? ?Patient coming from:  The patient is coming from home.  At baseline, pt is independent for most of ADL.       ? ?Chief Complaint: fall and left hip pain ? ?HPI: Sarah Riddle is a 81 y.o. female with medical history significant of A-fib on Coumadin, GERD, HTN, HDL, IBS, OSA on CPAP, gout, and osteoporosis, who presents with fall and left hip pain ? ?Pt states that she accidentally tripped her steps and fell yesterday (5/16) morning at about 3 AM. She developed the pain in the left hip. No LOC.  No head or neck injury.  The left hip pain is constant, moderate, sharp, nonradiating.  It is aggravated by walking.  Patient does not have chest pain or shortness of breath.  She has mild dry cough.  No nausea vomiting, diarrhea or abdominal pain.  No symptoms of UTI. ? ?Pt was found to have a large left gluteal hematoma.  She was given 1 dose of Kcentra and 10 mg of vitamin K. Her INR was 1.9 --> 1.1 ? ?Data Reviewed and ED Course: pt was found to have hemoglobin 12.0 on 01/29/2022 --> 11.6, 9.9, 10.3, troponin level 9 --> 10, positive urinalysis (hazy appearance, large amount of leukocyte, rare bacteria, WBC 21-50), AKI with creatinine 1.22, BUN 19 (baseline creatinine 0.73 on 09/29/2021), temperature normal, blood pressure 170/66, heart rate 89, RR 19, oxygen saturation 92-99% on room air.  X-ray of left hip/pelvis is negative.  Patient is placed on telemetry bed for observation. ? ?CT-left hip: ? ?3.5 x 15.8 x 7.5 cm subcutaneous hematoma in the left gluteal region. ?  ?No evidence of acute fracture or dislocation. ?  ?Subacute bilateral sacral ala fractures, possibly sacral insufficiency fractures. ?  ?Large subcutaneous hematoma in the left gluteal region, as above ? ? ?EKG: I have personally reviewed.  Sinus rhythm, QTc 423, early R wave progression. ? ? ?Review of  Systems:  ? ?General: no fevers, chills, no body weight gain, has fatigue ?HEENT: no blurry vision, hearing changes or sore throat ?Respiratory: no dyspnea, coughing, wheezing ?CV: no chest pain, no palpitations ?GI: no nausea, vomiting, abdominal pain, diarrhea, constipation ?GU: no dysuria, burning on urination, increased urinary frequency, hematuria  ?Ext: no leg edema ?Neuro: no unilateral weakness, numbness, or tingling, no vision change or hearing loss. Has fall. ?Skin: no rash, no skin tear. ?MSK: No muscle spasm, no deformity, no limitation of range of movement in spin. Has left hip pain ?Heme: No easy bruising.  ?Travel history: No recent long distant travel. ? ? ?Allergy:  ?Allergies  ?Allergen Reactions  ? Iodinated Contrast Media Shortness Of Breath  ?  Other reaction(s): Unknown  ? Ibuprofen Other (See Comments)  ?  "stomach problems" ?  ? Lubiprostone Other (See Comments)  ?  unknown ?  ? Sulfa Antibiotics Other (See Comments)  ?  "Stomach problems" ?  ? Zolpidem Other (See Comments)  ?  "hallucinations" ? ?Other reaction(s): Confusion  ? Boniva [Ibandronic Acid]   ? Celecoxib Other (See Comments)  ?  GI DISTRESS ?GI upset ?  ? Duloxetine Other (See Comments)  ? Tetracyclines & Related Nausea Only and Nausea And Vomiting  ? ? ?Past Medical History:  ?Diagnosis Date  ? A-fib ([place])   ? Age related osteoporosis   ? Cancer [facility])   ? Melanoma  x 2 -- Lt Leg -1976 Rt Arm - 2014  ? Chronic bilateral low back pain   ? Chronic kidney disease   ? Family history of breast cancer   ? Family history of colon cancer   ? Family history of melanoma   ? Family history of prostate cancer   ? Fibrocystic breast disease   ? GERD (gastroesophageal reflux disease)   ? Hyperlipidemia   ? Hypertension   ? IBS (irritable bowel syndrome)   ? Lumbago   ? Neuropathy   ? Obstructive sleep apnea   ? Osteoarthritis of shoulder   ? ? ?Past Surgical History:  ?Procedure Laterality Date  ? ABDOMINAL HYSTERECTOMY    ? BACK SURGERY    ?  BREAST CYST ASPIRATION Left 2001  ? Negative  ? CARDIAC ELECTROPHYSIOLOGY MAPPING AND ABLATION    ? EYE SURGERY    ? TOTAL HIP ARTHROPLASTY Left   ? ? ?Social History:  reports that she has never smoked. She has never used smokeless tobacco. She reports that she does not drink alcohol and does not use drugs. ? ?Family History:  ?Family History  ?Problem Relation Age of Onset  ? Colon cancer Mother 59  ? Prostate cancer Father   ?     dx 12s  ? Cancer Sister   ? Acute myelogenous leukemia Sister   ?     dx 9s  ? Prostate cancer Brother   ? Melanoma Brother   ? Prostate cancer Brother   ? Melanoma Brother   ? HIV/AIDS Brother 13  ? Tuberculosis Paternal Aunt   ? Melanoma Other   ? Breast cancer Other   ? Melanoma Niece   ?  ? ?Prior to Admission medications   ?Medication Sig Start Date End Date Taking? Authorizing Provider  ?acetaminophen (TYLENOL) 500 MG tablet Take 500 mg by mouth 2 (two) times a day. ?Patient not taking: Reported on 02/01/2022    [provider]  ?alendronate (FOSAMAX) 70 MG tablet Take 70 mg by mouth once a week. Take with a full glass of water on an empty stomach.    [provider]  ?allopurinol (ZYLOPRIM) 100 MG tablet Take 100 mg by mouth daily. 06/22/17   [provider]  ?amitriptyline (ELAVIL) 25 MG tablet Take 50 mg by mouth at bedtime.    [provider]  ?Cranberry 125 MG TABS Take by mouth daily.    [provider]  ?ergocalciferol (VITAMIN D2) 1.25 MG (50000 UT) capsule Take 50,000 Units by mouth once a week.    [provider]  ?ezetimibe (ZETIA) 10 MG tablet Take 10 mg by mouth daily. 08/01/17   [provider]  ?fluticasone (FLONASE) 50 MCG/ACT nasal spray Place 2 sprays into both nostrils daily. 07/31/17   [provider]  ?furosemide (LASIX) 20 MG tablet Take 1 tablet by mouth daily. 07/13/17   [provider]  ?gabapentin (NEURONTIN) 800 MG tablet Take 1 tablet (800 mg total) by mouth in the morning,  at noon, in the evening, and at bedtime. 03/10/20 02/01/22  Milinda Pointer, MD  ?isosorbide mononitrate (IMDUR) 60 MG 24 hr tablet Take 60 mg by mouth daily.    [provider]  ?levocetirizine (XYZAL) 5 MG tablet Take 5 mg by mouth daily. 07/31/17   [provider]  ?losartan (COZAAR) 50 MG tablet Take 50 mg by mouth daily. 08/23/17   [provider]  ?metoprolol succinate (TOPROL-XL) 25 MG 24 hr tablet Take 25 mg  by mouth daily.    [provider]  ?pantoprazole (PROTONIX) 40 MG tablet Take 20 mg by mouth daily.  08/22/17   [provider]  ?rosuvastatin (CRESTOR) 40 MG tablet Take 40 mg by mouth daily.    [provider]  ?sertraline (ZOLOFT) 25 MG tablet Take 25 mg by mouth 3 (three) times daily.     [provider]  ?warfarin (COUMADIN) 2 MG tablet Take 2 mg by mouth daily. '1mg'$  - sat, mon, wed ?'2mg'$ - sun, tues, thurs, fri 06/24/17   [provider]  ? ? ?Physical Exam: ?Vitals:  ? 02/17/22 1445 02/17/22 1500 02/17/22 1515 02/17/22 1540  ?BP:  (!) 140/59  (!) 148/57  ?Pulse: 91 87 87 88  ?Resp:    18  ?Temp:  98.9 ?F (37.2 ?C)  98.3 ?F (36.8 ?C)  ?TempSrc:  Oral    ?SpO2: 96% 100% 96% 100%  ?Weight:      ?Height:      ? ?General: Not in acute distress ?HEENT: ?      Eyes: PERRL, EOMI, no scleral icterus. ?      ENT: No discharge from the ears and nose, no pharynx injection, no tonsillar enlargement.  ?      Neck: No JVD, no bruit, no mass felt. ?Heme: No neck lymph node enlargement. ?Cardiac: S1/S2, RRR, No murmurs, No gallops or rubs. ?Respiratory: No rales, wheezing, rhonchi or rubs. ?GI: Soft, nondistended, nontender, no rebound pain, no organomegaly, BS present. ?GU: No hematuria ?Ext: No pitting leg edema bilaterally. 1+DP/PT pulse bilaterally. ?Musculoskeletal: No joint deformities, No joint redness or warmth, no limitation of ROM in spin. Has left hip tenderness ?Skin: No rashes.  ?Neuro: Alert, oriented X3, cranial nerves II-XII  grossly intact, moves all extremities normally.  ?Psych: Patient is not psychotic, no suicidal or hemocidal ideation. ? ?Labs on Admission: I have personally reviewed following labs and imaging studies ? ?CBC: ?Rece

## 2022-02-17 NOTE — Assessment & Plan Note (Signed)
-  fall precaution  

## 2022-02-17 NOTE — ED Notes (Signed)
Patient being admitted to [facility] ?

## 2022-02-17 NOTE — Assessment & Plan Note (Addendum)
-   Hold Coumadin ?-- Continue metoprolol ?

## 2022-02-17 NOTE — Assessment & Plan Note (Signed)
K 3.4 ?-Repleted potassium ?-Check  magnesium level ?

## 2022-02-17 NOTE — Assessment & Plan Note (Signed)
-   Crestor and zetia ?

## 2022-02-17 NOTE — Assessment & Plan Note (Signed)
Initially EDP consulted Dr.  Rosendo Gros  of trauma team in [facility]. He recommended transfer for observation, but pt has been in ED for whole day, there is no bed available soon. Pt is stabilized.  Her hemoglobin dropped from 12.0  to 11.6, 9.9, 10.3.  INR 1.9 decreased to 1.1. EDP spoke to Dr. Harlow Mares of orthopedic surgery as well as Dr. Cari Caraway of neurosurgery regarding the patient's bilateral sacral ala fractures.  Both of them agree that the patient does not require an operation and would be weightbearing as tolerated. Pt will not need to be transferred to [facility] any more ?-will place in tele bed for obs ?-check CBC q6h. The goal of transfusion of blood is Hgb < 7.0 ?

## 2022-02-17 NOTE — ED Notes (Signed)
Pt. Requesting urinary catheter because she cannot urinate. Pt. Has had purewick in place since last night. Pt. States she has not been able to relax enough to urinate, and cannot laying down. This RN presented request to ED MD. MD states insert foley urinary catheter r/t pt's pelvic fracture.  ?This RN questioned MD as to how much movement is appropriate for pt. R/t pelvic fx for inserting urinary cath. MD states he will talk to ortho about what is safest for pt.  ?

## 2022-02-17 NOTE — ED Notes (Signed)
Pt awake, requesting to urinate, advised on external catheter and educated pt on use, pt verbalized understanding and gave consent for periwick.  ?

## 2022-02-18 DIAGNOSIS — T148XXA Other injury of unspecified body region, initial encounter: Secondary | ICD-10-CM | POA: Diagnosis not present

## 2022-02-18 DIAGNOSIS — Z6830 Body mass index (BMI) 30.0-30.9, adult: Secondary | ICD-10-CM | POA: Diagnosis not present

## 2022-02-18 DIAGNOSIS — N179 Acute kidney failure, unspecified: Secondary | ICD-10-CM | POA: Diagnosis present

## 2022-02-18 DIAGNOSIS — I480 Paroxysmal atrial fibrillation: Secondary | ICD-10-CM | POA: Diagnosis present

## 2022-02-18 DIAGNOSIS — S3289XA Fracture of other parts of pelvis, initial encounter for closed fracture: Secondary | ICD-10-CM

## 2022-02-18 DIAGNOSIS — M7981 Nontraumatic hematoma of soft tissue: Secondary | ICD-10-CM | POA: Diagnosis present

## 2022-02-18 DIAGNOSIS — Z882 Allergy status to sulfonamides status: Secondary | ICD-10-CM | POA: Diagnosis not present

## 2022-02-18 DIAGNOSIS — Z85820 Personal history of malignant melanoma of skin: Secondary | ICD-10-CM | POA: Diagnosis not present

## 2022-02-18 DIAGNOSIS — M25552 Pain in left hip: Secondary | ICD-10-CM | POA: Diagnosis present

## 2022-02-18 DIAGNOSIS — S32119A Unspecified Zone I fracture of sacrum, initial encounter for closed fracture: Secondary | ICD-10-CM | POA: Diagnosis present

## 2022-02-18 DIAGNOSIS — I10 Essential (primary) hypertension: Secondary | ICD-10-CM | POA: Diagnosis present

## 2022-02-18 DIAGNOSIS — D6832 Hemorrhagic disorder due to extrinsic circulating anticoagulants: Secondary | ICD-10-CM | POA: Diagnosis present

## 2022-02-18 DIAGNOSIS — W010XXA Fall on same level from slipping, tripping and stumbling without subsequent striking against object, initial encounter: Secondary | ICD-10-CM | POA: Diagnosis present

## 2022-02-18 DIAGNOSIS — T45515A Adverse effect of anticoagulants, initial encounter: Secondary | ICD-10-CM | POA: Diagnosis present

## 2022-02-18 DIAGNOSIS — Z91041 Radiographic dye allergy status: Secondary | ICD-10-CM | POA: Diagnosis not present

## 2022-02-18 DIAGNOSIS — E785 Hyperlipidemia, unspecified: Secondary | ICD-10-CM | POA: Diagnosis present

## 2022-02-18 DIAGNOSIS — Z803 Family history of malignant neoplasm of breast: Secondary | ICD-10-CM | POA: Diagnosis not present

## 2022-02-18 DIAGNOSIS — E876 Hypokalemia: Secondary | ICD-10-CM | POA: Diagnosis present

## 2022-02-18 DIAGNOSIS — D62 Acute posthemorrhagic anemia: Secondary | ICD-10-CM | POA: Diagnosis present

## 2022-02-18 DIAGNOSIS — Z888 Allergy status to other drugs, medicaments and biological substances status: Secondary | ICD-10-CM | POA: Diagnosis not present

## 2022-02-18 DIAGNOSIS — Z7901 Long term (current) use of anticoagulants: Secondary | ICD-10-CM | POA: Diagnosis not present

## 2022-02-18 DIAGNOSIS — Y92009 Unspecified place in unspecified non-institutional (private) residence as the place of occurrence of the external cause: Secondary | ICD-10-CM | POA: Diagnosis not present

## 2022-02-18 DIAGNOSIS — G4733 Obstructive sleep apnea (adult) (pediatric): Secondary | ICD-10-CM | POA: Diagnosis present

## 2022-02-18 DIAGNOSIS — M810 Age-related osteoporosis without current pathological fracture: Secondary | ICD-10-CM | POA: Diagnosis present

## 2022-02-18 DIAGNOSIS — K589 Irritable bowel syndrome without diarrhea: Secondary | ICD-10-CM | POA: Diagnosis present

## 2022-02-18 DIAGNOSIS — I951 Orthostatic hypotension: Secondary | ICD-10-CM

## 2022-02-18 DIAGNOSIS — E669 Obesity, unspecified: Secondary | ICD-10-CM | POA: Diagnosis present

## 2022-02-18 DIAGNOSIS — K219 Gastro-esophageal reflux disease without esophagitis: Secondary | ICD-10-CM | POA: Diagnosis present

## 2022-02-18 DIAGNOSIS — Z800 Family history of malignant neoplasm of digestive organs: Secondary | ICD-10-CM | POA: Diagnosis not present

## 2022-02-18 LAB — URINE CULTURE: Culture: <10000 — AB

## 2022-02-18 LAB — CBC
HCT: 30.2 % — ABNORMAL LOW (ref 36.0–46.0)
HCT: 33.2 % — ABNORMAL LOW (ref 36.0–46.0)
Hemoglobin: 9.5 g/dL — ABNORMAL LOW (ref 12.0–15.0)
Hemoglobin: 10.5 g/dL — ABNORMAL LOW (ref 12.0–15.0)
MCH: 28.7 pg (ref 26.0–34.0)
MCH: 29 pg (ref 26.0–34.0)
MCHC: 31.5 g/dL (ref 30.0–36.0)
MCHC: 31.6 g/dL (ref 30.0–36.0)
MCV: 91.2 fL (ref 80.0–100.0)
MCV: 91.7 fL (ref 80.0–100.0)
Platelets: 188 10*3/uL (ref 150–400)
Platelets: 209 10*3/uL (ref 150–400)
RBC: 3.31 MIL/uL — ABNORMAL LOW (ref 3.87–5.11)
RBC: 3.62 MIL/uL — ABNORMAL LOW (ref 3.87–5.11)
RDW: 12.8 % (ref 11.5–15.5)
RDW: 13 % (ref 11.5–15.5)
WBC: 6.9 10*3/uL (ref 4.0–10.5)
WBC: 8.9 10*3/uL (ref 4.0–10.5)
nRBC: 0 % (ref 0.0–0.2)
nRBC: 0 % (ref 0.0–0.2)

## 2022-02-18 LAB — BASIC METABOLIC PANEL
Anion gap: 7 (ref 5–15)
BUN: 17 mg/dL (ref 8–23)
CO2: 29 mmol/L (ref 22–32)
Calcium: 8.8 mg/dL — ABNORMAL LOW (ref 8.9–10.3)
Chloride: 104 mmol/L (ref 98–111)
Creatinine, Ser: 0.93 mg/dL (ref 0.44–1.00)
GFR, Estimated: >60 mL/min (ref >60)
Glucose, Bld: 127 mg/dL — ABNORMAL HIGH (ref 70–99)
Potassium: 4 mmol/L (ref 3.5–5.1)
Sodium: 140 mmol/L (ref 135–145)

## 2022-02-18 LAB — PROTIME-INR
INR: 1.2 (ref 0.8–1.2)
Prothrombin Time: 15.3 seconds — ABNORMAL HIGH (ref 11.4–15.2)

## 2022-02-18 MED ORDER — LACTATED RINGERS IV SOLN: INTRAVENOUS | Status: DC

## 2022-02-18 NOTE — Progress Notes (Signed)
Met with the patient in the room at the bedside The patient lives at Home with her husband and her adult daughter The patient  currently has a rolling walker, shower seat BSC, and cane The patient will  not need additional DME They have transportation with her Daughter Sarah Riddle They can afford their medication  They are set up with [place] for Home health services   Admitted for: Hematoma  Pharmacy: [facility]

## 2022-02-18 NOTE — Progress Notes (Signed)
  Progress Note   Patient: Sarah Riddle TIR:[id number] DOB: 1941/04/16 DOA: 02/16/2022     0 DOS: the patient was seen and examined on 02/18/2022   Brief hospital course: ELEXA KIVI is a 81 y.o. female with medical history significant of A-fib on Coumadin, GERD, HTN, HDL, IBS, OSA on CPAP, gout, and osteoporosis, who presents with fall and left hip pain.  CT scan showed large subcutaneous hematoma in the left gluteal region.  Subacute bilateral sacral alla fracture. Warfarin was discontinued after admission, hemoglobin has been stable.  Assessment and Plan: Orthostatic hypotension Patient blood pressure dropped to 70s after standing up.  She was dizzy.  Patient has acute blood loss anemia, but hemoglobin has been stable.  We will looking for alternative source for this.  We will give IV fluids for now, monitor hemoglobin. Also check cortisol level.  Left gluteal region hematoma secondary to warfarin. Pelvic fracture. Mechanical fall Continue hold warfarin for a week. Recheck CBC tomorrow, hemoglobin has been stable.  Patient does not have significant pain.  Hypokalemia. Potassium has normalized, recheck level tomorrow.  Essential hypertension On beta-blocker. Cozaar on hold.  Acute kidney injury. Renal function has improved.  Paroxysmal atrial fibrillation. Currently in sinus, hold off anticoagulation continue metoprolol.  Pyuria. Patient does not have urinary symptoms, UTI ruled out.  Antibiotics will be discontinued. Urine culture had insignificant growth.  Obesity with BMI 30.68.      Subjective:  Patient feels less dizzy, but still has significant orthostatic hypotension with standing up. No significant pain.  No shortness of breath or cough.  Physical Exam: Vitals:   02/17/22 1933 02/18/22 0019 02/18/22 0522 02/18/22 0736  BP: (!) 159/73 106/61 (!) 106/51 (!) 119/51  Pulse: 92 83 76 78  Resp: '17 18 17 16  '$ Temp: 99.3 F (37.4 C) 97.8 F (36.6 C) 99 F  (37.2 C) 99.1 F (37.3 C)  TempSrc:  Oral    SpO2: 99% 100% 97% 95%  Weight:      Height:       General exam: Appears calm and comfortable  Respiratory system: Clear to auscultation. Respiratory effort normal. Cardiovascular system: S1 & S2 heard, RRR. No JVD, murmurs, rubs, gallops or clicks. No pedal edema. Gastrointestinal system: Abdomen is nondistended, soft and nontender. No organomegaly or masses felt. Normal bowel sounds heard. Central nervous system: Alert and oriented. No focal neurological deficits. Extremities: Symmetric 5 x 5 power. Skin: No rashes, lesions or ulcers Psychiatry: Judgement and insight appear normal. Mood & affect appropriate.   Data Reviewed:  CT results reviewed, all lab results reviewed.  Family Communication: Daughter updated at the bedside.  Disposition: Status is: Observation   Planned Discharge Destination: Home with Home Health    Time spent: 28 minutes  Author: Sharen Hones, MD 02/18/2022 12:26 PM  For on call review [email or web address].

## 2022-02-18 NOTE — Evaluation (Signed)
Occupational Therapy Evaluation Patient Details Name: Sarah Riddle MRN: [id number] DOB: [birth year] Today's Date: 02/18/2022   History of Present Illness DASHAWN GOLDA is a 81 y.o. female with medical history significant of A-fib on Coumadin, GERD, HTN, HDL, IBS, OSA on CPAP, gout, and osteoporosis, who presents with fall and left hip pain. 02/16/22 L hip CT: Subacute bilateral sacral ala fractures, possibly sacral insufficiency fractures.   Clinical Impression   Pt was seen for OT evaluation this date. Prior to hospital admission, pt was independent in mobility and ADLs. Pt lives with husband and daughter. Pt presents to acute OT demonstrating impaired ADL performance and functional mobility 2/2 decreased activity tolerance and orthostatic hypotension. Pt currently requires MIN A donning/doffing socks in sitting and CGA + RW for simulated BSC t/f. Pt primarily limited by orthostatic hypotension and no physical assistance needed. Pt reported no pain. Upon hospital discharge, recommend no OT follow-up.  Orthostatic Vitals  Sitting: BP: 116/61, MAP: 76, HR: 89 Standing: BP: 77/46, MAP: 56, HR: 100, + dizziness RN notified   Recommendations for follow up therapy are one component of a multi-disciplinary discharge planning process, led by the attending physician.  Recommendations may be updated based on patient status, additional functional criteria and insurance authorization.   Follow Up Recommendations  No OT follow up    Assistance Recommended at Discharge Set up Supervision/Assistance  Patient can return home with the following Assistance with cooking/housework;Assist for transportation    Functional Status Assessment  Patient has had a recent decline in their functional status and demonstrates the ability to make significant improvements in function in a reasonable and predictable amount of time.  Equipment Recommendations  None recommended by OT    Recommendations for Other  Services       Precautions / Restrictions Precautions Precautions: Fall Restrictions Weight Bearing Restrictions: Yes RLE Weight Bearing: Weight bearing as tolerated LLE Weight Bearing: Weight bearing as tolerated      Mobility Bed Mobility               General bed mobility comments: Not tested    Transfers Overall transfer level: Needs assistance Equipment used: None Transfers: Sit to/from Stand Sit to Stand: Min guard                  Balance Overall balance assessment: No apparent balance deficits (not formally assessed)                                         ADL either performed or assessed with clinical judgement   ADL Overall ADL's : Needs assistance/impaired                                       General ADL Comments: MIN A donning/doffing socks in sitting, CGA + RW for simulated BSC t/f, and CGA for standing grooming task w/ poor tolerance.      Pertinent Vitals/Pain Pain Assessment Pain Assessment: No/denies pain     Hand Dominance     Extremity/Trunk Assessment Upper Extremity Assessment Upper Extremity Assessment: Overall WFL for tasks assessed   Lower Extremity Assessment Lower Extremity Assessment: Generalized weakness       Communication Communication Communication: No difficulties   Cognition Arousal/Alertness: Awake/alert Behavior During Therapy: WFL for tasks assessed/performed Overall Cognitive Status: Within  Functional Limits for tasks assessed                                                  Home Living Family/patient expects to be discharged to:: Private residence Living Arrangements: Spouse/significant other;Children (Lives with husband and daughter) Available Help at Discharge: Family;Available PRN/intermittently Type of Home: House Home Access: Stairs to enter [employer] of Steps: 1 Entrance Stairs-Rails: Right Home Layout: One level      Bathroom Shower/Tub: Occupational psychologist: Handicapped height     Home Equipment: Shower seat;BSC/3in1;Toilet riser;Grab bars - tub/shower   Additional Comments: Pt reports living with her spouse and her daughter who is a CNA that works nights. Spouse unable to provide physical assistance and does not drive.      Prior Functioning/Environment Prior Level of Function : Driving;Independent/Modified Independent                        OT Problem List: Decreased activity tolerance;Decreased range of motion;Decreased strength;Impaired balance (sitting and/or standing)      OT Treatment/Interventions: Self-care/ADL training;Therapeutic exercise;Therapeutic activities;Balance training;Patient/family education;Energy conservation    OT Goals(Current goals can be found in the care plan section) Acute Rehab OT Goals Patient Stated Goal: To return home OT Goal Formulation: With patient Time For Goal Achievement: 03/04/22 Potential to Achieve Goals: Good ADL Goals Pt Will Perform Grooming: standing;Independently Pt Will Perform Lower Body Dressing: with adaptive equipment;with modified independence Pt Will Transfer to Toilet: with modified independence;ambulating;regular height toilet  OT Frequency: Min 2X/week    Co-evaluation              AM-PAC OT "6 Clicks" Daily Activity     Outcome Measure Help from another person eating meals?: None Help from another person taking care of personal grooming?: A Little Help from another person toileting, which includes using toliet, bedpan, or urinal?: A Little Help from another person bathing (including washing, rinsing, drying)?: A Little Help from another person to put on and taking off regular upper body clothing?: None Help from another person to put on and taking off regular lower body clothing?: A Little 6 Click Score: 20   End of Session Equipment Utilized During Treatment: Gait belt;Rolling walker (2  wheels) Nurse Communication: Mobility status  Activity Tolerance: Patient tolerated treatment well;Treatment limited secondary to medical complications (Comment) (Orthostatic hypotension) Patient left: in chair;with call bell/phone within reach  OT Visit Diagnosis: Unsteadiness on feet (R26.81)                Time: 0940-1000 OT Time Calculation (min): 20 min Charges:  OT General Charges $OT Visit: 1 Visit OT Evaluation $OT Eval Moderate Complexity: 1 Mod  [employer] OTDS  [employer] 02/18/2022, 2:30 PM

## 2022-02-19 DIAGNOSIS — D62 Acute posthemorrhagic anemia: Secondary | ICD-10-CM | POA: Diagnosis not present

## 2022-02-19 DIAGNOSIS — T148XXA Other injury of unspecified body region, initial encounter: Secondary | ICD-10-CM | POA: Diagnosis not present

## 2022-02-19 DIAGNOSIS — N179 Acute kidney failure, unspecified: Secondary | ICD-10-CM | POA: Diagnosis not present

## 2022-02-19 LAB — CBC
HCT: 28.9 % — ABNORMAL LOW (ref 36.0–46.0)
Hemoglobin: 9 g/dL — ABNORMAL LOW (ref 12.0–15.0)
MCH: 28.4 pg (ref 26.0–34.0)
MCHC: 31.1 g/dL (ref 30.0–36.0)
MCV: 91.2 fL (ref 80.0–100.0)
Platelets: 180 10*3/uL (ref 150–400)
RBC: 3.17 MIL/uL — ABNORMAL LOW (ref 3.87–5.11)
RDW: 12.9 % (ref 11.5–15.5)
WBC: 6.7 10*3/uL (ref 4.0–10.5)
nRBC: 0 % (ref 0.0–0.2)

## 2022-02-19 LAB — BASIC METABOLIC PANEL
Anion gap: 7 (ref 5–15)
BUN: 19 mg/dL (ref 8–23)
CO2: 26 mmol/L (ref 22–32)
Calcium: 8.6 mg/dL — ABNORMAL LOW (ref 8.9–10.3)
Chloride: 104 mmol/L (ref 98–111)
Creatinine, Ser: 0.95 mg/dL (ref 0.44–1.00)
GFR, Estimated: >60 mL/min (ref >60)
Glucose, Bld: 116 mg/dL — ABNORMAL HIGH (ref 70–99)
Potassium: 3.7 mmol/L (ref 3.5–5.1)
Sodium: 137 mmol/L (ref 135–145)

## 2022-02-19 LAB — PROTIME-INR
INR: 1.1 (ref 0.8–1.2)
Prothrombin Time: 14.3 seconds (ref 11.4–15.2)

## 2022-02-19 LAB — CORTISOL: Cortisol, Plasma: 5.2 ug/dL

## 2022-02-19 LAB — MAGNESIUM: Magnesium: 1.9 mg/dL (ref 1.7–2.4)

## 2022-02-19 MED ORDER — LIDOCAINE 5 % EX PTCH: 1.0000 | MEDICATED_PATCH | CUTANEOUS | 0 refills | Status: DC

## 2022-02-19 NOTE — Progress Notes (Signed)
Physical Therapy Treatment Patient Details Name: Sarah Riddle MRN: [id number] DOB: 1941/09/18 Today's Date: 02/19/2022   History of Present Illness Sarah Riddle is a 81 y.o. female with medical history significant of A-fib on Coumadin, GERD, HTN, HDL, IBS, OSA on CPAP, gout, and osteoporosis, who presents with fall and left hip pain. 02/16/22 L hip CT: Subacute bilateral sacral ala fractures, possibly sacral insufficiency fractures.    PT Comments    Patient alert, in recliner at start of session. Denied any pain throughout mobility. Orthostatic vitals assessed, positive with initial standing but BP at 3 minutes WFLs and pt without symptoms. She was able to transfer modI with RW, and ambulated ~31f with RW and supervision. BP after ambulating as 112/57 (pt reported that her legs were feeling a little weak). PT and pt reviewed used of DME at home and importance of activity pacing.  RN and MD notified of orthostatic results. The patient would benefit from further skilled PT intervention to continue to progress towards goals. Recommendation remains appropriate.     Recommendations for follow up therapy are one component of a multi-disciplinary discharge planning process, led by the attending physician.  Recommendations may be updated based on patient status, additional functional criteria and insurance authorization.  Follow Up Recommendations  Home health PT     Assistance Recommended at Discharge Frequent or constant Supervision/Assistance  Patient can return home with the following A little help with walking and/or transfers;A little help with bathing/dressing/bathroom;Assistance with cooking/housework;Assist for transportation;Help with stairs or ramp for entrance   Equipment Recommendations  None recommended by PT    Recommendations for Other Services       Precautions / Restrictions Precautions Precautions: Fall Restrictions Weight Bearing Restrictions: Yes RLE Weight  Bearing: Weight bearing as tolerated LLE Weight Bearing: Weight bearing as tolerated     Mobility  Bed Mobility               General bed mobility comments: Not tested    Transfers Overall transfer level: Modified independent Equipment used: Rolling walker (2 wheels)                    Ambulation/Gait Ambulation/Gait assistance: Supervision Gait Distance (Feet): 65 Feet Assistive device: Rolling walker (2 wheels)         General Gait Details: pt with decreased velocity, careful steps. denies symptoms but does report that her legs were feeling a little weak towards end of ambulation   Stairs             Wheelchair Mobility    Modified Rankin (Stroke Patients Only)       Balance Overall balance assessment: Needs assistance Sitting-balance support: Feet supported Sitting balance-Leahy Scale: Normal       Standing balance-Leahy Scale: Good Standing balance comment: improved comfort and safety with RW                            Cognition Arousal/Alertness: Awake/alert Behavior During Therapy: WFL for tasks assessed/performed Overall Cognitive Status: Within Functional Limits for tasks assessed                                          Exercises      General Comments        Pertinent Vitals/Pain Pain Assessment Pain Assessment: No/denies pain  Home Living                          Prior Function            PT Goals (current goals can now be found in the care plan section) Progress towards PT goals: Progressing toward goals    Frequency    Min 2X/week      PT Plan Current plan remains appropriate    Co-evaluation              AM-PAC PT "6 Clicks" Mobility   Outcome Measure  Help needed turning from your back to your side while in a flat bed without using bedrails?: None Help needed moving from lying on your back to sitting on the side of a flat bed without using  bedrails?: None Help needed moving to and from a bed to a chair (including a wheelchair)?: None Help needed standing up from a chair using your arms (e.g., wheelchair or bedside chair)?: None Help needed to walk in hospital room?: None Help needed climbing 3-5 steps with a railing? : A Little 6 Click Score: 23    End of Session Equipment Utilized During Treatment: Gait belt Activity Tolerance: Patient tolerated treatment well Patient left: in bed;with call bell/phone within reach Nurse Communication: Mobility status (BP) PT Visit Diagnosis: History of falling (Z91.81);Muscle weakness (generalized) (M62.81);Difficulty in walking, not elsewhere classified (R26.2)     Time: 1505-6979 PT Time Calculation (min) (ACUTE ONLY): 16 min  Charges:  $Therapeutic Activity: 8-22 mins                     Lieutenant Diego PT, DPT 9:34 AM,02/19/22

## 2022-02-19 NOTE — Progress Notes (Addendum)
Sarah Riddle to be D/C'd Home per MD order.  Discussed prescriptions and follow up appointments with the patient. Prescriptions given to patient, medication list explained in detail. Pt verbalized understanding.Daughter at bedside to pick up pt.  Allergies as of 02/19/2022       Reactions   Iodinated Contrast Media Shortness Of Breath   Other reaction(s): Unknown   Ibuprofen Other (See Comments)   "stomach problems"   Lubiprostone Other (See Comments)   unknown   Sulfa Antibiotics Other (See Comments)   "Stomach problems"   Zolpidem Other (See Comments)   "hallucinations" Other reaction(s): Confusion   Boniva [ibandronic Acid]    Celecoxib Other (See Comments)   GI DISTRESS GI upset   Duloxetine Other (See Comments)   Tetracyclines & Related Nausea Only, Nausea And Vomiting        Medication List     STOP taking these medications    ergocalciferol 1.25 MG (50000 UT) capsule Commonly known as: VITAMIN D2   furosemide 20 MG tablet Commonly known as: LASIX   losartan 50 MG tablet Commonly known as: COZAAR   warfarin 2 MG tablet Commonly known as: COUMADIN       TAKE these medications    acetaminophen 500 MG tablet Commonly known as: TYLENOL Take 500 mg by mouth 2 (two) times a day.   alendronate 70 MG tablet Commonly known as: FOSAMAX Take 70 mg by mouth once a week. Take with a full glass of water on an empty stomach.   allopurinol 100 MG tablet Commonly known as: ZYLOPRIM Take 100 mg by mouth daily.   amitriptyline 25 MG tablet Commonly known as: ELAVIL Take 50 mg by mouth at bedtime. What changed: Another medication with the same name was removed. Continue taking this medication, and follow the directions you see here.   Cranberry 125 MG Tabs Take by mouth daily.   ezetimibe 10 MG tablet Commonly known as: ZETIA Take 10 mg by mouth daily.   fluticasone 50 MCG/ACT nasal spray Commonly known as: FLONASE Place 2 sprays into both nostrils  daily.   gabapentin 800 MG tablet Commonly known as: Neurontin Take 1 tablet (800 mg total) by mouth in the morning, at noon, in the evening, and at bedtime.   isosorbide mononitrate 60 MG 24 hr tablet Commonly known as: IMDUR Take 60 mg by mouth daily.   levocetirizine 5 MG tablet Commonly known as: XYZAL Take 5 mg by mouth daily.   lidocaine 5 % Commonly known as: LIDODERM Place 1 patch onto the skin daily. Remove & Discard patch within 12 hours or as directed by MD   metoprolol succinate 25 MG 24 hr tablet Commonly known as: TOPROL-XL Take 25 mg by mouth daily.   nitroGLYCERIN 0.4 MG SL tablet Commonly known as: NITROSTAT Place under the tongue.   pantoprazole 40 MG tablet Commonly known as: PROTONIX Take 40 mg by mouth daily.   rosuvastatin 40 MG tablet Commonly known as: CRESTOR Take 40 mg by mouth at bedtime.   sertraline 25 MG tablet Commonly known as: ZOLOFT Take 25 mg by mouth 3 (three) times daily.        Vitals:   02/19/22 0927 02/19/22 1205  BP: (!) 112/57 122/67  Pulse:  (!) 102  Resp:  16  Temp:  98.1 F (36.7 C)  SpO2:  99%    Skin clean, dry and intact without evidence of skin break down, no evidence of skin tears noted. IV catheter discontinued intact. Site  without signs and symptoms of complications. Dressing and pressure applied. Pt denies pain at this time. No complaints noted.  An After Visit Summary was printed and given to the patient. Patient escorted via [place], and D/C home via private auto.  Rolley Sims

## 2022-02-19 NOTE — Discharge Summary (Signed)
Physician Discharge Summary   Patient: Sarah Riddle MRN: [id number] DOB: 1941-01-31  Admit date:     02/16/2022  Discharge date: 02/19/22  Discharge Physician: Sharen Hones   PCP: Perrin Maltese, MD   Recommendations at discharge:   Follow-up with PCP in 1 week.  Discharge Diagnoses: Principal Problem:   Hematoma_left gluteal region Active Problems:   Acute blood loss anemia   Fall   Obesity (BMI 30-39.9)   PAF (paroxysmal atrial fibrillation) (HCC)   Pelvic fracture (HCC)   AKI (acute kidney injury) ([place])   HTN (hypertension)   HLD (hyperlipidemia)   Hypokalemia   Orthostatic hypotension  Resolved Problems:   * No resolved hospital problems. *  Hospital Course: Sarah Riddle is a 81 y.o. female with medical history significant of A-fib on Coumadin, GERD, HTN, HDL, IBS, OSA on CPAP, gout, and osteoporosis, who presents with fall and left hip pain.  CT scan showed large subcutaneous hematoma in the left gluteal region.  Subacute bilateral sacral alla fracture. Warfarin was discontinued after admission, hemoglobin has been stable.  Assessment and Plan: Orthostatic hypotension Had a significant orthostatic hypotension with dizziness when walking.  Diuretics was on hold, losartan was also on hold.  She actually received IV fluids.  Her cortisol level was 5.2. Condition has improved, blood pressure is better.  Continue hold Lasix and losartan   Left gluteal region hematoma secondary to warfarin. Pelvic fracture. Mechanical fall Continue hold warfarin for a week. Globin continue to be stable.  Follow-up with PCP and cardiology in 1 week time.  At that time can decide if anticoagulation can be restarted.   Hypokalemia. Potassium has normalized.  Essential hypertension On beta-blocker. Cozaar on hold.  Acute kidney injury. Renal function has improved.  Paroxysmal atrial fibrillation. Currently in sinus, hold off anticoagulation continue  metoprolol.  Pyuria. Patient does not have urinary symptoms, UTI ruled out.   Urine culture had insignificant growth.   Obesity with BMI 30.68.       Consultants: None Procedures performed: None  Disposition: Home health Diet recommendation:  Discharge Diet Orders (From admission, onward)     Start     Ordered   02/19/22 0000  Diet - low sodium heart healthy        02/19/22 1111           Cardiac diet DISCHARGE MEDICATION: Allergies as of 02/19/2022       Reactions   Iodinated Contrast Media Shortness Of Breath   Other reaction(s): Unknown   Ibuprofen Other (See Comments)   "stomach problems"   Lubiprostone Other (See Comments)   unknown   Sulfa Antibiotics Other (See Comments)   "Stomach problems"   Zolpidem Other (See Comments)   "hallucinations" Other reaction(s): Confusion   Boniva [ibandronic Acid]    Celecoxib Other (See Comments)   GI DISTRESS GI upset   Duloxetine Other (See Comments)   Tetracyclines & Related Nausea Only, Nausea And Vomiting        Medication List     STOP taking these medications    ergocalciferol 1.25 MG (50000 UT) capsule Commonly known as: VITAMIN D2   furosemide 20 MG tablet Commonly known as: LASIX   losartan 50 MG tablet Commonly known as: COZAAR   warfarin 2 MG tablet Commonly known as: COUMADIN       TAKE these medications    acetaminophen 500 MG tablet Commonly known as: TYLENOL Take 500 mg by mouth 2 (two) times a day.  alendronate 70 MG tablet Commonly known as: FOSAMAX Take 70 mg by mouth once a week. Take with a full glass of water on an empty stomach.   allopurinol 100 MG tablet Commonly known as: ZYLOPRIM Take 100 mg by mouth daily.   amitriptyline 25 MG tablet Commonly known as: ELAVIL Take 50 mg by mouth at bedtime. What changed: Another medication with the same name was removed. Continue taking this medication, and follow the directions you see here.   Cranberry 125 MG Tabs Take  by mouth daily.   ezetimibe 10 MG tablet Commonly known as: ZETIA Take 10 mg by mouth daily.   fluticasone 50 MCG/ACT nasal spray Commonly known as: FLONASE Place 2 sprays into both nostrils daily.   gabapentin 800 MG tablet Commonly known as: Neurontin Take 1 tablet (800 mg total) by mouth in the morning, at noon, in the evening, and at bedtime.   isosorbide mononitrate 60 MG 24 hr tablet Commonly known as: IMDUR Take 60 mg by mouth daily.   levocetirizine 5 MG tablet Commonly known as: XYZAL Take 5 mg by mouth daily.   lidocaine 5 % Commonly known as: LIDODERM Place 1 patch onto the skin daily. Remove & Discard patch within 12 hours or as directed by MD   metoprolol succinate 25 MG 24 hr tablet Commonly known as: TOPROL-XL Take 25 mg by mouth daily.   nitroGLYCERIN 0.4 MG SL tablet Commonly known as: NITROSTAT Place under the tongue.   pantoprazole 40 MG tablet Commonly known as: PROTONIX Take 40 mg by mouth daily.   rosuvastatin 40 MG tablet Commonly known as: CRESTOR Take 40 mg by mouth at bedtime.   sertraline 25 MG tablet Commonly known as: ZOLOFT Take 25 mg by mouth 3 (three) times daily.        Follow-up Information     Perrin Maltese, MD Follow up in 1 week(s).   Specialty: Internal Medicine Contact information: [place] [address] [phone]                Discharge Exam: Danley Danker Weights   02/16/22 2004  Weight: 99.8 kg   General exam: Appears calm and comfortable  Respiratory system: Clear to auscultation. Respiratory effort normal. Cardiovascular system: S1 & S2 heard, RRR. No JVD, murmurs, rubs, gallops or clicks. No pedal edema. Gastrointestinal system: Abdomen is nondistended, soft and nontender. No organomegaly or masses felt. Normal bowel sounds heard. Central nervous system: Alert and oriented. No focal neurological deficits. Extremities: Symmetric 5 x 5 power. Skin: No rashes, lesions or  ulcers Psychiatry: Judgement and insight appear normal. Mood & affect appropriate.    Condition at discharge: good  The results of significant diagnostics from this hospitalization (including imaging, microbiology, ancillary and laboratory) are listed below for reference.   Imaging Studies: CT Hip Left Wo Contrast  Result Date: 02/16/2022 CLINICAL DATA:  Fall EXAM: CT OF THE LEFT HIP WITHOUT CONTRAST TECHNIQUE: Multidetector CT imaging of the left hip was performed according to the standard protocol. Multiplanar CT image reconstructions were also generated. RADIATION DOSE REDUCTION: This exam was performed according to the departmental dose-optimization program which includes automated exposure control, adjustment of the mA and/or kV according to patient size and/or use of iterative reconstruction technique. COMPARISON:  Left hip radiographs dated 02/16/2022 FINDINGS: Left hip arthroplasty. No evidence of hardware fracture or loosening. Bilateral sacral ala fractures (series 2/image 9), subacute, possibly sacral insufficiency fractures. No evidence of acute fracture or dislocation. 3.5 x 15.8 x  7.5 cm subcutaneous hematoma in the left gluteal region. Trace pelvic ascites.  Vascular calcifications. IMPRESSION: No evidence of acute fracture or dislocation. Subacute bilateral sacral ala fractures, possibly sacral insufficiency fractures. Large subcutaneous hematoma in the left gluteal region, as above. Electronically Signed   By: Julian Hy M.D.   On: 02/16/2022 21:22   DG Hip Unilat With Pelvis 2-3 Views Left  Result Date: 02/16/2022 CLINICAL DATA:  Fall at home today.  Left hip pain. EXAM: DG HIP (WITH OR WITHOUT PELVIS) 2-3V LEFT COMPARISON:  None Available. FINDINGS: Bipolar left hip prosthesis is seen in appropriate position. There is no evidence of hip fracture or dislocation. Pubic symphysis degenerative changes noted. IMPRESSION: No acute findings. Electronically Signed   By: Marlaine Hind  M.D.   On: 02/16/2022 20:32    Microbiology: Results for orders placed or performed during the hospital encounter of 02/16/22  Urine Culture     Status: Abnormal   Collection Time: 02/16/22  9:17 PM   Specimen: Urine, Random  Result Value Ref Range Status   Specimen Description   Final    URINE, RANDOM Performed at [facility], [address], [place], [place] [address]    Special Requests   Final    NONE Performed at [facility], [address], [place], [place] [address]    Culture (A)  Final    <10,000 COLONIES/mL INSIGNIFICANT GROWTH Performed at [facility], [place] [address], [place], [place] [address]    Report Status 02/18/2022 FINAL  Final    Labs: CBC: Recent Labs  Lab 02/17/22 1605 02/17/22 2140 02/18/22 0322 02/18/22 1009 02/19/22 0455  WBC 8.1 8.4 6.9 8.9 6.7  HGB 10.8* 10.5* 9.5* 10.5* 9.0*  HCT 34.0* 32.6* 30.2* 33.2* 28.9*  MCV 91.6 90.6 91.2 91.7 91.2  PLT 200 201 188 209 034   Basic Metabolic Panel: Recent Labs  Lab 02/16/22 2009 02/17/22 1605 02/18/22 0322 02/19/22 0455  NA 139  --  140 137  K 3.4*  --  4.0 3.7  CL 102  --  104 104  CO2 29  --  29 26  GLUCOSE 137*  --  127* 116*  BUN 19  --  17 19  CREATININE 1.22*  --  0.93 0.95  CALCIUM 9.7  --  8.8* 8.6*  MG  --  1.9  --  1.9   Liver Function Tests: No results for input(s): AST, ALT, ALKPHOS, BILITOT, PROT, ALBUMIN in the last 168 hours. CBG: No results for input(s): GLUCAP in the last 168 hours.  Discharge time spent: greater than 30 minutes.  Signed: Sharen Hones, MD [facility] 02/19/2022

## 2022-02-25 DIAGNOSIS — R5383 Other fatigue: Secondary | ICD-10-CM | POA: Diagnosis not present

## 2022-02-25 DIAGNOSIS — I10 Essential (primary) hypertension: Secondary | ICD-10-CM | POA: Diagnosis not present

## 2022-02-25 DIAGNOSIS — M1990 Unspecified osteoarthritis, unspecified site: Secondary | ICD-10-CM | POA: Diagnosis not present

## 2022-02-25 DIAGNOSIS — E782 Mixed hyperlipidemia: Secondary | ICD-10-CM | POA: Diagnosis not present

## 2022-02-25 DIAGNOSIS — G63 Polyneuropathy in diseases classified elsewhere: Secondary | ICD-10-CM | POA: Diagnosis not present

## 2022-02-25 DIAGNOSIS — R7303 Prediabetes: Secondary | ICD-10-CM | POA: Diagnosis not present

## 2022-02-26 DIAGNOSIS — E669 Obesity, unspecified: Secondary | ICD-10-CM | POA: Diagnosis not present

## 2022-02-26 DIAGNOSIS — I4891 Unspecified atrial fibrillation: Secondary | ICD-10-CM | POA: Diagnosis not present

## 2022-02-26 DIAGNOSIS — I2510 Atherosclerotic heart disease of native coronary artery without angina pectoris: Secondary | ICD-10-CM | POA: Diagnosis not present

## 2022-02-26 DIAGNOSIS — I10 Essential (primary) hypertension: Secondary | ICD-10-CM | POA: Diagnosis not present

## 2022-02-26 DIAGNOSIS — E782 Mixed hyperlipidemia: Secondary | ICD-10-CM | POA: Diagnosis not present

## 2022-02-26 DIAGNOSIS — I340 Nonrheumatic mitral (valve) insufficiency: Secondary | ICD-10-CM | POA: Diagnosis not present

## 2022-03-03 DIAGNOSIS — I4891 Unspecified atrial fibrillation: Secondary | ICD-10-CM | POA: Diagnosis not present

## 2022-03-03 DIAGNOSIS — I10 Essential (primary) hypertension: Secondary | ICD-10-CM | POA: Diagnosis not present

## 2022-03-03 DIAGNOSIS — I2510 Atherosclerotic heart disease of native coronary artery without angina pectoris: Secondary | ICD-10-CM | POA: Diagnosis not present

## 2022-03-14 ENCOUNTER — Other Ambulatory Visit: Payer: Self-pay

## 2022-03-14 ENCOUNTER — Emergency Department: Payer: Medicare PPO

## 2022-03-14 ENCOUNTER — Emergency Department
Admission: EM | Admit: 2022-03-14 | Discharge: 2022-03-14 | Disposition: A | Discharge status: Home / Self Care | Payer: Medicare PPO | Attending: Emergency Medicine | Admitting: Emergency Medicine

## 2022-03-14 ENCOUNTER — Encounter: Payer: Self-pay | Admitting: Emergency Medicine

## 2022-03-14 DIAGNOSIS — N189 Chronic kidney disease, unspecified: Secondary | ICD-10-CM | POA: Diagnosis not present

## 2022-03-14 DIAGNOSIS — G43909 Migraine, unspecified, not intractable, without status migrainosus: Secondary | ICD-10-CM | POA: Diagnosis not present

## 2022-03-14 DIAGNOSIS — I129 Hypertensive chronic kidney disease with stage 1 through stage 4 chronic kidney disease, or unspecified chronic kidney disease: Secondary | ICD-10-CM | POA: Insufficient documentation

## 2022-03-14 DIAGNOSIS — R52 Pain, unspecified: Secondary | ICD-10-CM | POA: Diagnosis not present

## 2022-03-14 DIAGNOSIS — G8929 Other chronic pain: Secondary | ICD-10-CM | POA: Insufficient documentation

## 2022-03-14 DIAGNOSIS — M542 Cervicalgia: Secondary | ICD-10-CM | POA: Diagnosis not present

## 2022-03-14 DIAGNOSIS — R519 Headache, unspecified: Secondary | ICD-10-CM | POA: Insufficient documentation

## 2022-03-14 DIAGNOSIS — R110 Nausea: Secondary | ICD-10-CM | POA: Diagnosis not present

## 2022-03-14 DIAGNOSIS — I10 Essential (primary) hypertension: Secondary | ICD-10-CM | POA: Diagnosis not present

## 2022-03-14 DIAGNOSIS — G4489 Other headache syndrome: Secondary | ICD-10-CM | POA: Diagnosis not present

## 2022-03-14 LAB — BASIC METABOLIC PANEL WITH GFR
Anion gap: 4 — ABNORMAL LOW (ref 5–15)
BUN: 15 mg/dL (ref 8–23)
CO2: 28 mmol/L (ref 22–32)
Calcium: 9.5 mg/dL (ref 8.9–10.3)
Chloride: 109 mmol/L (ref 98–111)
Creatinine, Ser: 0.72 mg/dL (ref 0.44–1.00)
GFR, Estimated: >60 mL/min
Glucose, Bld: 120 mg/dL — ABNORMAL HIGH (ref 70–99)
Potassium: 3.7 mmol/L (ref 3.5–5.1)
Sodium: 141 mmol/L (ref 135–145)

## 2022-03-14 LAB — CBC
HCT: 35.9 % — ABNORMAL LOW (ref 36.0–46.0)
Hemoglobin: 11 g/dL — ABNORMAL LOW (ref 12.0–15.0)
MCH: 29.4 pg (ref 26.0–34.0)
MCHC: 30.6 g/dL (ref 30.0–36.0)
MCV: 96 fL (ref 80.0–100.0)
Platelets: 216 10*3/uL (ref 150–400)
RBC: 3.74 MIL/uL — ABNORMAL LOW (ref 3.87–5.11)
RDW: 14.3 % (ref 11.5–15.5)
WBC: 7 10*3/uL (ref 4.0–10.5)
nRBC: 0 % (ref 0.0–0.2)

## 2022-03-14 MED ORDER — DIPHENHYDRAMINE HCL 50 MG/ML IJ SOLN
25.0000 mg | Freq: Once | INTRAMUSCULAR | Status: AC
  Administered 2022-03-14: 25 mg via INTRAVENOUS
  Filled 2022-03-14: qty 1

## 2022-03-14 MED ORDER — METOCLOPRAMIDE HCL 5 MG/ML IJ SOLN
10.0000 mg | INTRAMUSCULAR | Status: AC
  Administered 2022-03-14: 10 mg via INTRAVENOUS
  Filled 2022-03-14: qty 2

## 2022-03-14 MED ORDER — SODIUM CHLORIDE 0.9 % IV BOLUS
500.0000 mL | Freq: Once | INTRAVENOUS | Status: AC
  Administered 2022-03-14: 500 mL via INTRAVENOUS

## 2022-03-14 NOTE — ED Provider Notes (Signed)
[facility] Provider Note    Event Date/Time   First MD Initiated Contact with Patient 03/14/22 1754     (approximate)   History   Chief Complaint: Headache   HPI  ELOWYN RAUPP is a 81 y.o. female with a history of chronic pain, CKD, hypertension, GERD, migraine headaches, tension headaches who comes ED complaining of severe headache for last 2 days, predominantly left frontal.  Waxing and waning, improved with taking ibuprofen at home but worsened again today.  No aggravating factors.  Does not feel like her usual migraines which normally heard on the right-hand side of her head.  She also complains of some pain at the left occiput and left neck which is not sudden onset or tearing or severe.  No paresthesias or weakness, no change in balance or coordination, no vision changes.     Physical Exam   Triage Vital Signs: ED Triage Vitals  Enc Vitals Group     BP 03/14/22 1707 (!) 171/70     Pulse Rate 03/14/22 1707 69     Resp 03/14/22 1707 18     Temp 03/14/22 1707 98.3 F (36.8 C)     Temp Source 03/14/22 1707 Oral     SpO2 03/14/22 1707 98 %     Weight 03/14/22 1705 220 lb (99.8 kg)     Height 03/14/22 1705 '5\' 11"'$  (1.803 m)     Head Circumference --      Peak Flow --      Pain Score 03/14/22 1705 7     Pain Loc --      Pain Edu? --      Excl. in [place]? --     Most recent vital signs: Vitals:   03/14/22 1707  BP: (!) 171/70  Pulse: 69  Resp: 18  Temp: 98.3 F (36.8 C)  SpO2: 98%    General: Awake, no distress.  CV:  Good peripheral perfusion.  Regular rate rhythm Resp:  Normal effort.  Abd:  No distention.  Other:  Thyroid nonpalpable.  Neck nontender without swelling.  EOMI, PERRL, no nystagmus.  Cranial nerves III through XII intact.  Neuro intact.   ED Results / Procedures / Treatments   Labs (all labs ordered are listed, but only abnormal results are displayed) Labs Reviewed  CBC - Abnormal; Notable for the following  components:      Result Value   RBC 3.74 (*)    Hemoglobin 11.0 (*)    HCT 35.9 (*)    All other components within normal limits  BASIC METABOLIC PANEL - Abnormal; Notable for the following components:   Glucose, Bld 120 (*)    Anion gap 4 (*)    All other components within normal limits     EKG Interpreted by me Sinus rhythm rate of 72.  Normal axis and intervals.  Normal QRS ST segments and T waves.  1 PVC on the strip.   RADIOLOGY MRI brain and MRA head/neck pending   PROCEDURES:  Procedures   MEDICATIONS ORDERED IN ED: Medications  sodium chloride 0.9 % bolus 500 mL (500 mLs Intravenous New Bag/Given 03/14/22 1823)  metoCLOPramide (REGLAN) injection 10 mg (10 mg Intravenous Given 03/14/22 1823)  diphenhydrAMINE (BENADRYL) injection 25 mg (25 mg Intravenous Given 03/14/22 1822)     IMPRESSION / MDM / ASSESSMENT AND PLAN / ED COURSE  I reviewed the triage vital signs and the nursing notes.  Differential diagnosis includes, but is not limited to, cerebral aneurysm, vertebral dissection, intracranial mass, intracranial hemorrhage, migraine headache  Patient's presentation is most consistent with acute presentation with potential threat to life or bodily function.  Patient presents with severe headache which is not consistent with her usual migraine pattern.  She has no focal neurodeficits.  Doubt meningitis encephalitis, venous thrombosis, glaucoma, intracranial hypertension.  Patient has IV contrast allergy, so I will obtain MRI/MRA to evaluate for cerebral aneurysm or intracranial mass or hemorrhage.       FINAL CLINICAL IMPRESSION(S) / ED DIAGNOSES   Final diagnoses:  Bad headache     Rx / DC Orders   ED Discharge Orders     None        Note:  This document was prepared using Dragon voice recognition software and may include unintentional dictation errors.   Carrie Mew, MD 03/14/22 1921

## 2022-03-14 NOTE — ED Notes (Signed)
Non-rebreather placed on patient per Dr. Joni Fears.

## 2022-03-14 NOTE — ED Notes (Signed)
Patient ambulated to the bathroom with assistance. Urine sample obtained.

## 2022-03-14 NOTE — ED Triage Notes (Signed)
Pt in via EMS from home with c/o HA for last 2 days. Pt recently here and admitted for a fall. Pt's BP meds have been changed in the last 2 weeks. 189/78, HR 75, 99% RA. Pt vomited x's 1 en route. #20 g to left AC, pt was given '4mg'$  of zofran

## 2022-03-14 NOTE — ED Notes (Signed)
Patient ambulated to hallway bathroom with a steady gait and stand-by assist.

## 2022-03-14 NOTE — ED Triage Notes (Signed)
Pt via EMS from home. Pt c/o headache, states that it started yesterday. States she took medication yesterday and it helped and then today said it started again today. States she has a hx of migraines but it has never been this bad. Pt also nauseous. Pt states she has been hypertensive. States she was taken off her BP medication she states she has been having issues being hypotension. Pt was also taken off her blood thinner since her fall on 5/16. Pt is A&OX4 and NAD.

## 2022-03-15 DIAGNOSIS — C44629 Squamous cell carcinoma of skin of left upper limb, including shoulder: Secondary | ICD-10-CM | POA: Diagnosis not present

## 2022-03-16 DIAGNOSIS — I4891 Unspecified atrial fibrillation: Secondary | ICD-10-CM | POA: Diagnosis not present

## 2022-03-18 DIAGNOSIS — G4700 Insomnia, unspecified: Secondary | ICD-10-CM | POA: Diagnosis not present

## 2022-03-18 DIAGNOSIS — E782 Mixed hyperlipidemia: Secondary | ICD-10-CM | POA: Diagnosis not present

## 2022-03-18 DIAGNOSIS — G4489 Other headache syndrome: Secondary | ICD-10-CM | POA: Diagnosis not present

## 2022-03-18 DIAGNOSIS — R7303 Prediabetes: Secondary | ICD-10-CM | POA: Diagnosis not present

## 2022-03-18 DIAGNOSIS — I10 Essential (primary) hypertension: Secondary | ICD-10-CM | POA: Diagnosis not present

## 2022-03-19 DIAGNOSIS — I2510 Atherosclerotic heart disease of native coronary artery without angina pectoris: Secondary | ICD-10-CM | POA: Diagnosis not present

## 2022-03-19 DIAGNOSIS — E782 Mixed hyperlipidemia: Secondary | ICD-10-CM | POA: Diagnosis not present

## 2022-03-19 DIAGNOSIS — I10 Essential (primary) hypertension: Secondary | ICD-10-CM | POA: Diagnosis not present

## 2022-03-19 DIAGNOSIS — I4891 Unspecified atrial fibrillation: Secondary | ICD-10-CM | POA: Diagnosis not present

## 2022-03-19 DIAGNOSIS — I340 Nonrheumatic mitral (valve) insufficiency: Secondary | ICD-10-CM | POA: Diagnosis not present

## 2022-03-19 DIAGNOSIS — E669 Obesity, unspecified: Secondary | ICD-10-CM | POA: Diagnosis not present

## 2022-03-29 DIAGNOSIS — D045 Carcinoma in situ of skin of trunk: Secondary | ICD-10-CM | POA: Diagnosis not present

## 2022-04-05 DIAGNOSIS — I340 Nonrheumatic mitral (valve) insufficiency: Secondary | ICD-10-CM | POA: Diagnosis not present

## 2022-04-05 DIAGNOSIS — R0602 Shortness of breath: Secondary | ICD-10-CM | POA: Diagnosis not present

## 2022-04-05 DIAGNOSIS — I4891 Unspecified atrial fibrillation: Secondary | ICD-10-CM | POA: Diagnosis not present

## 2022-04-05 DIAGNOSIS — I10 Essential (primary) hypertension: Secondary | ICD-10-CM | POA: Diagnosis not present

## 2022-04-05 DIAGNOSIS — E669 Obesity, unspecified: Secondary | ICD-10-CM | POA: Diagnosis not present

## 2022-04-05 DIAGNOSIS — E782 Mixed hyperlipidemia: Secondary | ICD-10-CM | POA: Diagnosis not present

## 2022-04-05 DIAGNOSIS — I2510 Atherosclerotic heart disease of native coronary artery without angina pectoris: Secondary | ICD-10-CM | POA: Diagnosis not present

## 2022-04-07 DIAGNOSIS — T8140XA Infection following a procedure, unspecified, initial encounter: Secondary | ICD-10-CM | POA: Diagnosis not present

## 2022-04-13 DIAGNOSIS — I2510 Atherosclerotic heart disease of native coronary artery without angina pectoris: Secondary | ICD-10-CM | POA: Diagnosis not present

## 2022-04-13 DIAGNOSIS — I340 Nonrheumatic mitral (valve) insufficiency: Secondary | ICD-10-CM | POA: Diagnosis not present

## 2022-04-13 DIAGNOSIS — I10 Essential (primary) hypertension: Secondary | ICD-10-CM | POA: Diagnosis not present

## 2022-04-13 DIAGNOSIS — E782 Mixed hyperlipidemia: Secondary | ICD-10-CM | POA: Diagnosis not present

## 2022-04-13 DIAGNOSIS — I4891 Unspecified atrial fibrillation: Secondary | ICD-10-CM | POA: Diagnosis not present

## 2022-04-13 DIAGNOSIS — E669 Obesity, unspecified: Secondary | ICD-10-CM | POA: Diagnosis not present

## 2022-04-28 ENCOUNTER — Emergency Department: Payer: Medicare PPO

## 2022-04-28 ENCOUNTER — Emergency Department
Admission: EM | Admit: 2022-04-28 | Discharge: 2022-04-28 | Disposition: A | Discharge status: Home / Self Care | Payer: Medicare PPO | Attending: Emergency Medicine | Admitting: Emergency Medicine

## 2022-04-28 ENCOUNTER — Encounter: Payer: Self-pay | Admitting: Physician Assistant

## 2022-04-28 DIAGNOSIS — Z7901 Long term (current) use of anticoagulants: Secondary | ICD-10-CM | POA: Diagnosis not present

## 2022-04-28 DIAGNOSIS — M47812 Spondylosis without myelopathy or radiculopathy, cervical region: Secondary | ICD-10-CM | POA: Diagnosis not present

## 2022-04-28 DIAGNOSIS — I6529 Occlusion and stenosis of unspecified carotid artery: Secondary | ICD-10-CM | POA: Diagnosis not present

## 2022-04-28 DIAGNOSIS — R519 Headache, unspecified: Secondary | ICD-10-CM | POA: Diagnosis not present

## 2022-04-28 DIAGNOSIS — I129 Hypertensive chronic kidney disease with stage 1 through stage 4 chronic kidney disease, or unspecified chronic kidney disease: Secondary | ICD-10-CM | POA: Diagnosis not present

## 2022-04-28 DIAGNOSIS — S199XXA Unspecified injury of neck, initial encounter: Secondary | ICD-10-CM | POA: Diagnosis not present

## 2022-04-28 DIAGNOSIS — S42351A Displaced comminuted fracture of shaft of humerus, right arm, initial encounter for closed fracture: Secondary | ICD-10-CM | POA: Insufficient documentation

## 2022-04-28 DIAGNOSIS — S4991XA Unspecified injury of right shoulder and upper arm, initial encounter: Secondary | ICD-10-CM | POA: Diagnosis present

## 2022-04-28 DIAGNOSIS — W19XXXA Unspecified fall, initial encounter: Secondary | ICD-10-CM

## 2022-04-28 DIAGNOSIS — W010XXA Fall on same level from slipping, tripping and stumbling without subsequent striking against object, initial encounter: Secondary | ICD-10-CM | POA: Diagnosis not present

## 2022-04-28 DIAGNOSIS — S0990XA Unspecified injury of head, initial encounter: Secondary | ICD-10-CM | POA: Diagnosis not present

## 2022-04-28 DIAGNOSIS — S42301A Unspecified fracture of shaft of humerus, right arm, initial encounter for closed fracture: Secondary | ICD-10-CM | POA: Diagnosis not present

## 2022-04-28 DIAGNOSIS — N189 Chronic kidney disease, unspecified: Secondary | ICD-10-CM | POA: Insufficient documentation

## 2022-04-28 DIAGNOSIS — M25511 Pain in right shoulder: Secondary | ICD-10-CM | POA: Diagnosis not present

## 2022-04-28 DIAGNOSIS — M79603 Pain in arm, unspecified: Secondary | ICD-10-CM | POA: Diagnosis not present

## 2022-04-28 DIAGNOSIS — I10 Essential (primary) hypertension: Secondary | ICD-10-CM | POA: Diagnosis not present

## 2022-04-28 DIAGNOSIS — R110 Nausea: Secondary | ICD-10-CM | POA: Diagnosis not present

## 2022-04-28 DIAGNOSIS — I4891 Unspecified atrial fibrillation: Secondary | ICD-10-CM | POA: Diagnosis not present

## 2022-04-28 DIAGNOSIS — I672 Cerebral atherosclerosis: Secondary | ICD-10-CM | POA: Diagnosis not present

## 2022-04-28 LAB — BASIC METABOLIC PANEL WITH GFR
Anion gap: 3 — ABNORMAL LOW (ref 5–15)
BUN: 17 mg/dL (ref 8–23)
CO2: 29 mmol/L (ref 22–32)
Calcium: 9 mg/dL (ref 8.9–10.3)
Chloride: 110 mmol/L (ref 98–111)
Creatinine, Ser: 0.66 mg/dL (ref 0.44–1.00)
GFR, Estimated: >60 mL/min
Glucose, Bld: 128 mg/dL — ABNORMAL HIGH (ref 70–99)
Potassium: 3.6 mmol/L (ref 3.5–5.1)
Sodium: 142 mmol/L (ref 135–145)

## 2022-04-28 LAB — CBC WITH DIFFERENTIAL/PLATELET
Abs Immature Granulocytes: 0.04 10*3/uL (ref 0.00–0.07)
Basophils Absolute: 0 10*3/uL (ref 0.0–0.1)
Basophils Relative: 1 %
Eosinophils Absolute: 0.2 10*3/uL (ref 0.0–0.5)
Eosinophils Relative: 2 %
HCT: 33.5 % — ABNORMAL LOW (ref 36.0–46.0)
Hemoglobin: 10.4 g/dL — ABNORMAL LOW (ref 12.0–15.0)
Immature Granulocytes: 1 %
Lymphocytes Relative: 36 %
Lymphs Abs: 2.8 10*3/uL (ref 0.7–4.0)
MCH: 29.8 pg (ref 26.0–34.0)
MCHC: 31 g/dL (ref 30.0–36.0)
MCV: 96 fL (ref 80.0–100.0)
Monocytes Absolute: 0.5 10*3/uL (ref 0.1–1.0)
Monocytes Relative: 6 %
Neutro Abs: 4.3 10*3/uL (ref 1.7–7.7)
Neutrophils Relative %: 54 %
Platelets: 223 10*3/uL (ref 150–400)
RBC: 3.49 MIL/uL — ABNORMAL LOW (ref 3.87–5.11)
RDW: 13.2 % (ref 11.5–15.5)
WBC: 7.8 10*3/uL (ref 4.0–10.5)
nRBC: 0 % (ref 0.0–0.2)

## 2022-04-28 MED ORDER — OXYCODONE-ACETAMINOPHEN 5-325 MG PO TABS: 1.0000 | ORAL_TABLET | Freq: Four times a day (QID) | ORAL | 0 refills | Status: AC | PRN

## 2022-04-28 MED ORDER — ONDANSETRON 4 MG PO TBDP: 4.0000 mg | ORAL_TABLET | Freq: Three times a day (TID) | ORAL | 0 refills | Status: DC | PRN

## 2022-04-28 MED ORDER — CYCLOBENZAPRINE HCL 5 MG PO TABS: 5.0000 mg | ORAL_TABLET | Freq: Three times a day (TID) | ORAL | 0 refills | Status: DC | PRN

## 2022-04-28 MED ORDER — ONDANSETRON 4 MG PO TBDP
4.0000 mg | ORAL_TABLET | Freq: Once | ORAL | Status: DC
  Filled 2022-04-28: qty 1

## 2022-04-28 MED ORDER — HYDROMORPHONE HCL 1 MG/ML IJ SOLN
1.0000 mg | Freq: Once | INTRAMUSCULAR | Status: AC
  Administered 2022-04-28: 1 mg via INTRAVENOUS
  Filled 2022-04-28: qty 1

## 2022-04-28 MED ORDER — OXYCODONE-ACETAMINOPHEN 5-325 MG PO TABS
1.0000 | ORAL_TABLET | Freq: Once | ORAL | Status: DC
  Filled 2022-04-28: qty 1

## 2022-04-28 MED ORDER — ONDANSETRON HCL 4 MG/2ML IJ SOLN
4.0000 mg | Freq: Once | INTRAMUSCULAR | Status: AC
  Administered 2022-04-28: 4 mg via INTRAVENOUS
  Filled 2022-04-28: qty 2

## 2022-04-28 NOTE — ED Provider Notes (Signed)
[facility] Emergency Department Provider Note     Event Date/Time   First MD Initiated Contact with Patient 04/28/22 1158     (approximate)   History   Fall (Fall from standing position, found in prone position, on eliquis, c/o pain to right arm possible broken, no LOC)   HPI  Sarah Riddle is a 81 y.o. female with a history of significant chronic pain, DDD, hypertension, A-fib on Eliquis, lumbago, and CKD, presents from home.  Patient presents via EMS after an apparent fall from standing position.  She tripped over husband's gas grill. She fell landing on her right shoulder. She was found by EMS in a prone position.  Patient presents with complaints of right arm pain she denies any LOC, headache, nausea, vomiting, or weakness.  Physical Exam   Triage Vital Signs: ED Triage Vitals  Enc Vitals Group     BP 04/28/22 1159 135/62     Pulse Rate 04/28/22 1159 66     Resp 04/28/22 1159 18     Temp 04/28/22 1159 98.6 F (37 C)     Temp Source 04/28/22 1159 Oral     SpO2 04/28/22 1159 94 %     Weight 04/28/22 1201 202 lb (91.6 kg)     Height 04/28/22 1201 '5\' 11"'$  (1.803 m)     Head Circumference --      Peak Flow --      Pain Score 04/28/22 1158 5     Pain Loc --      Pain Edu? --      Excl. in [place]? --     Most recent vital signs: Vitals:   04/28/22 1159  BP: 135/62  Pulse: 66  Resp: 18  Temp: 98.6 F (37 C)  SpO2: 94%    General Awake, no distress.  HEENT NCAT. PERRL. EOMI. No rhinorrhea. Mucous membranes are moist.  CV:  Good peripheral perfusion.  RESP:  Normal effort.  ABD:  No distention.  MSK:  Patient with obvious deformity to the right upper arm at the humeral shaft.  Normal composite fist distally.   ED Results / Procedures / Treatments   Labs (all labs ordered are listed, but only abnormal results are displayed) Labs Reviewed  BASIC METABOLIC PANEL - Abnormal; Notable for the following components:      Result Value    Glucose, Bld 128 (*)    Anion gap 3 (*)    All other components within normal limits  CBC WITH DIFFERENTIAL/PLATELET - Abnormal; Notable for the following components:   RBC 3.49 (*)    Hemoglobin 10.4 (*)    HCT 33.5 (*)    All other components within normal limits  URINALYSIS, ROUTINE W REFLEX MICROSCOPIC     EKG   RADIOLOGY  I personally viewed and evaluated these images as part of my medical decision making, as well as reviewing the written report by the radiologist.  ED Provider Interpretation: comminuted midshaft right humeral fracrture  DG Humerus Right  Result Date: 04/28/2022 CLINICAL DATA:  Mid humeral shaft pain after falling today EXAM: RIGHT HUMERUS - 2+ VIEW COMPARISON:  None FINDINGS: Osseous demineralization. Comminuted displaced fracture of the proximal to mid RIGHT humeral diaphysis. Mild overriding and apex lateral angulation. Elbow and shoulder joint alignments grossly normal. IMPRESSION: Comminuted mildly displaced and angulated proximal to mid RIGHT humeral diaphyseal fracture. Electronically Signed   By: Lavonia Dana M.D.   On: 04/28/2022 13:35   CT SHOULDER  RIGHT WO CONTRAST  Result Date: 04/28/2022 CLINICAL DATA:  Right shoulder pain.  Fall today, head trauma. EXAM: CT OF THE UPPER RIGHT EXTREMITY WITHOUT CONTRAST TECHNIQUE: Multidetector CT imaging of the upper right extremity was performed according to the standard protocol. RADIATION DOSE REDUCTION: This exam was performed according to the departmental dose-optimization program which includes automated exposure control, adjustment of the mA and/or kV according to patient size and/or use of iterative reconstruction technique. COMPARISON:  None Available. FINDINGS: Bones/Joint/Cartilage There is a comminuted segmental fracture of the proximal humeral shaft. The proximal fracture line is at the level of the humeral surgical neck and the distal transverse fracture line at the level of the mid humeral shaft. The the  fracture segment measures approximately 8.8 cm. There is apex lateral angulation of the distal fracture fragment. The glenohumeral and acromioclavicular joints appear maintained. Moderate osteoarthritis of the acromioclavicular joint. Ligaments Suboptimally assessed by CT. Muscles and Tendons Mild generalized muscle atrophy. No appreciable hematoma or fluid collection. Soft tissue swelling about the fracture site as expected. Others: Moderate size right pleural effusion. IMPRESSION: 1. Comminuted segmental fracture of the proximal humeral shaft as described above. 2. Moderate acromioclavicular and mild glenohumeral osteoarthritis. No evidence of dislocation. 3. Soft tissue swelling about the fracture as expected. No drainable fluid collection or hematoma. 4.  Moderate right pleural effusion.  No appreciable pneumothorax. Electronically Signed   By: Keane Police D.O.   On: 04/28/2022 13:20   CT HEAD WO CONTRAST (5MM)  Result Date: 04/28/2022 CLINICAL DATA:  81 year old female with acute head and neck injury from fall today. Initial encounter. EXAM: CT HEAD WITHOUT CONTRAST CT CERVICAL SPINE WITHOUT CONTRAST TECHNIQUE: Multidetector CT imaging of the head and cervical spine was performed following the standard protocol without intravenous contrast. Multiplanar CT image reconstructions of the cervical spine were also generated. RADIATION DOSE REDUCTION: This exam was performed according to the departmental dose-optimization program which includes automated exposure control, adjustment of the mA and/or kV according to patient size and/or use of iterative reconstruction technique. COMPARISON:  09/21/2018 head CT, 09/24/2019 cervical spine MR and prior studies FINDINGS: CT HEAD FINDINGS Brain: No evidence of acute infarction, hemorrhage, hydrocephalus, extra-axial collection or mass lesion/mass effect. Atrophy and chronic small-vessel white matter ischemic changes again noted. Vascular: Carotid and vertebral  atherosclerotic calcifications are noted. Skull: Normal. Negative for fracture or focal lesion. Sinuses/Orbits: No acute abnormality. A RIGHT mastoid effusion is again noted. Other: None. CT CERVICAL SPINE FINDINGS Alignment: Reversal of the normal cervical lordosis and spondylolisthesis at C3-4 and C4-5 again noted. No acute subluxation identified. Skull base and vertebrae: No acute fracture. No primary bone lesion or focal pathologic process. Soft tissues and spinal canal: No prevertebral fluid or swelling. No visible canal hematoma. Disc levels: Moderate to severe degenerative disc disease/spondylosis from C3-C7 again noted contributing to central spinal and foraminal narrowing several levels. Upper chest: There may be pleural effusions present. No other acute abnormality noted. Other: None IMPRESSION: 1. No evidence of acute intracranial abnormality. Atrophy and chronic small-vessel white matter ischemic changes. 2. No static evidence of acute injury to the cervical spine. Multilevel degenerative changes again noted. Electronically Signed   By: Margarette Canada M.D.   On: 04/28/2022 13:05   CT Cervical Spine Wo Contrast  Result Date: 04/28/2022 CLINICAL DATA:  81 year old female with acute head and neck injury from fall today. Initial encounter. EXAM: CT HEAD WITHOUT CONTRAST CT CERVICAL SPINE WITHOUT CONTRAST TECHNIQUE: Multidetector CT imaging of the head and cervical  spine was performed following the standard protocol without intravenous contrast. Multiplanar CT image reconstructions of the cervical spine were also generated. RADIATION DOSE REDUCTION: This exam was performed according to the departmental dose-optimization program which includes automated exposure control, adjustment of the mA and/or kV according to patient size and/or use of iterative reconstruction technique. COMPARISON:  09/21/2018 head CT, 09/24/2019 cervical spine MR and prior studies FINDINGS: CT HEAD FINDINGS Brain: No evidence of acute  infarction, hemorrhage, hydrocephalus, extra-axial collection or mass lesion/mass effect. Atrophy and chronic small-vessel white matter ischemic changes again noted. Vascular: Carotid and vertebral atherosclerotic calcifications are noted. Skull: Normal. Negative for fracture or focal lesion. Sinuses/Orbits: No acute abnormality. A RIGHT mastoid effusion is again noted. Other: None. CT CERVICAL SPINE FINDINGS Alignment: Reversal of the normal cervical lordosis and spondylolisthesis at C3-4 and C4-5 again noted. No acute subluxation identified. Skull base and vertebrae: No acute fracture. No primary bone lesion or focal pathologic process. Soft tissues and spinal canal: No prevertebral fluid or swelling. No visible canal hematoma. Disc levels: Moderate to severe degenerative disc disease/spondylosis from C3-C7 again noted contributing to central spinal and foraminal narrowing several levels. Upper chest: There may be pleural effusions present. No other acute abnormality noted. Other: None IMPRESSION: 1. No evidence of acute intracranial abnormality. Atrophy and chronic small-vessel white matter ischemic changes. 2. No static evidence of acute injury to the cervical spine. Multilevel degenerative changes again noted. Electronically Signed   By: Margarette Canada M.D.   On: 04/28/2022 13:05     PROCEDURES:  Critical Care performed: No  Procedures   MEDICATIONS ORDERED IN ED: Medications  ondansetron (ZOFRAN) injection 4 mg (4 mg Intravenous Given 04/28/22 1313)  HYDROmorphone (DILAUDID) injection 1 mg (1 mg Intravenous Given 04/28/22 1312)     IMPRESSION / MDM / ASSESSMENT AND PLAN / ED COURSE  I reviewed the triage vital signs and the nursing notes.                              Differential diagnosis includes, but is not limited to, SDH, concussion, cervical fracture, shoulder fracture, shoulder dislocation  Patient's presentation is most consistent with acute complicated illness / injury requiring  diagnostic workup.  The patient is on the cardiac monitor to evaluate for evidence of arrhythmia and/or significant heart rate changes.  Geriatric patient to the ED following mechanical fall.  Patient is evaluated for complaints in the ED, have overall reassuring work-up.  No CT evidence of any acute close head injury, cervical spine fracture, or acute neuromuscular deficit.  She does have plain film x-ray evidence of a comminuted midshaft right humeral fracture.  Patient's diagnosis is consistent with humeral fracture.  To be placed in a coaptation OCL splint with a sling.  Patient will be discharged home with prescriptions for cyclobenzaprine, Zofran, and Percocet. Patient is to follow up with Dr. Mack Guise as needed or otherwise directed. Patient is given ED precautions to return to the ED for any worsening or new symptoms.   FINAL CLINICAL IMPRESSION(S) / ED DIAGNOSES   Final diagnoses:  Fall in home, initial encounter  Closed displaced comminuted fracture of shaft of right humerus, initial encounter     Rx / DC Orders   ED Discharge Orders          Ordered    oxyCODONE-acetaminophen (PERCOCET) 5-325 MG tablet  Every 6 hours PRN        04/28/22 1433  ondansetron (ZOFRAN-ODT) 4 MG disintegrating tablet  Every 8 hours PRN        04/28/22 1433    cyclobenzaprine (FLEXERIL) 5 MG tablet  3 times daily PRN        04/28/22 1433             Note:  This document was prepared using Dragon voice recognition software and may include unintentional dictation errors.    Melvenia Needles, PA-C 04/28/22 1441    Vanessa Waverly, MD 05/01/22 1250

## 2022-04-28 NOTE — Consult Note (Signed)
I was contacted by Lillia Mountain, PA regarding this 81 y/o female on Eliquis for a. Fib who sustained a mechanical fall today at home when she tripped over her husband's grill.  Patient fell onto her right side.  Ms. Carmie End reports the patient's skin is intact and she is neurovascularly intact.  Xrays demonstrate a comminuted fracture of the right humerus with moderate angulation and displacement.  I contacted Drs. Handy and Haddix the orthopaedic trauma specialists at [facility] regarding this case.  Dr. Marcelino Scot offered to fix the fracture tomorrow afternoon if [facility] had beds available for transfer.  However, the patient preferred to go home and follow up in their office as an outpatient.  I discussed the case with Ms. Carmie End and Dr. Starleen Blue in the ER at [facility].   I have recommended a coaptation splint, sling and have provided the patient's information to Drs. Handy and Haddix via [employer] text so their office can contact the patient and make her an outpatient appointment.

## 2022-04-28 NOTE — ED Notes (Signed)
Pt A&O, IV removed, pt given discharge instructions, pt assisted to vehicle by RN. 

## 2022-04-28 NOTE — Discharge Instructions (Addendum)
You are being treated for a fracture to your upper arm.  You should follow-up with Dr. Marcelino Scot next week as discussed.  Keep the sling/splint, clean and dry.  Take the prescription pain medicine, nausea medicine, muscle relaxant as needed.  Apply ice to reduce pain and swelling.

## 2022-05-03 ENCOUNTER — Encounter (HOSPITAL_COMMUNITY): Payer: Self-pay | Admitting: Student

## 2022-05-03 ENCOUNTER — Ambulatory Visit: Payer: Self-pay | Admitting: Student

## 2022-05-03 DIAGNOSIS — S42201A Unspecified fracture of upper end of right humerus, initial encounter for closed fracture: Secondary | ICD-10-CM | POA: Diagnosis not present

## 2022-05-03 DIAGNOSIS — I10 Essential (primary) hypertension: Secondary | ICD-10-CM | POA: Diagnosis not present

## 2022-05-03 DIAGNOSIS — S42291A Other displaced fracture of upper end of right humerus, initial encounter for closed fracture: Secondary | ICD-10-CM

## 2022-05-03 DIAGNOSIS — I4891 Unspecified atrial fibrillation: Secondary | ICD-10-CM | POA: Diagnosis not present

## 2022-05-03 DIAGNOSIS — I2510 Atherosclerotic heart disease of native coronary artery without angina pectoris: Secondary | ICD-10-CM | POA: Diagnosis not present

## 2022-05-04 ENCOUNTER — Other Ambulatory Visit: Payer: Self-pay

## 2022-05-04 ENCOUNTER — Encounter (HOSPITAL_COMMUNITY): Payer: Self-pay | Admitting: Student

## 2022-05-04 NOTE — Anesthesia Preprocedure Evaluation (Signed)
Anesthesia Evaluation  Patient identified by MRN, date of birth, ID band Patient awake    Reviewed: Allergy & Precautions, NPO status , Patient's Chart, lab work & pertinent test results  History of Anesthesia Complications (+) PONV and history of anesthetic complications  Airway Mallampati: II  TM Distance: >3 FB Neck ROM: Full    Dental no notable dental hx. (+) Upper Dentures, Dental Advisory Given   Pulmonary sleep apnea and Continuous Positive Airway Pressure Ventilation ,    Pulmonary exam normal breath sounds clear to auscultation       Cardiovascular hypertension, Pt. on medications and Pt. on home beta blockers Normal cardiovascular exam Rhythm:Regular Rate:Normal     Neuro/Psych  Neuromuscular disease    GI/Hepatic   Endo/Other    Renal/GU Renal diseaseLab Results      Component                Value               Date                      CREATININE               0.66                04/28/2022                BUN                      17                  04/28/2022                NA                       142                 04/28/2022                K                        3.6                 04/28/2022                CL                       110                 04/28/2022                CO2                      29                  04/28/2022                Musculoskeletal  (+) Arthritis ,   Abdominal   Peds  Hematology  (+) Blood dyscrasia, anemia , Lab Results      Component                Value               Date  WBC                      7.8                 04/28/2022                HGB                      10.4 (L)            04/28/2022                HCT                      33.5 (L)            04/28/2022                MCV                      96.0                04/28/2022                PLT                      223                 04/28/2022              Anesthesia Other  Findings   Reproductive/Obstetrics                           Anesthesia Physical Anesthesia Plan  ASA: 3  Anesthesia Plan: General and Regional   Post-op Pain Management: Regional block*   Induction: Intravenous  PONV Risk Score and Plan: Treatment may vary due to age or medical condition, Ondansetron and Dexamethasone  Airway Management Planned: Oral ETT  Additional Equipment: None  Intra-op Plan:   Post-operative Plan: Extubation in OR  Informed Consent: I have reviewed the patients History and Physical, chart, labs and discussed the procedure including the risks, benefits and alternatives for the proposed anesthesia with the patient or authorized representative who has indicated his/her understanding and acceptance.     Dental advisory given  Plan Discussed with:   Anesthesia Plan Comments: (R ISB)      Anesthesia Quick Evaluation

## 2022-05-04 NOTE — Progress Notes (Signed)
Sarah Riddle denies chest pain or shortness of breath. Patient denies having any s/s of Covid in her household.  Patient denies any known exposure to Covid.   Sarah Riddle PCP is Dr.N. Humphrey Rolls, cardiologist is Dr. Laurelyn Sickle.  Sarah Riddle has sleep apnea, I asked her to bring her mask with her.

## 2022-05-05 ENCOUNTER — Ambulatory Visit (HOSPITAL_COMMUNITY): Payer: Medicare PPO

## 2022-05-05 ENCOUNTER — Observation Stay (HOSPITAL_COMMUNITY)
Admission: RE | Admit: 2022-05-05 | Discharge: 2022-05-07 | Disposition: A | Discharge status: Home Health | Payer: Medicare PPO | Attending: Student | Admitting: Student

## 2022-05-05 ENCOUNTER — Ambulatory Visit (HOSPITAL_BASED_OUTPATIENT_CLINIC_OR_DEPARTMENT_OTHER): Payer: Medicare PPO | Admitting: Anesthesiology

## 2022-05-05 ENCOUNTER — Ambulatory Visit (HOSPITAL_COMMUNITY): Payer: Medicare PPO | Admitting: Anesthesiology

## 2022-05-05 ENCOUNTER — Encounter (HOSPITAL_COMMUNITY)
Admission: RE | Disposition: A | Discharge status: Home Health | Payer: Self-pay | Source: Home / Self Care | Attending: Student

## 2022-05-05 ENCOUNTER — Observation Stay (HOSPITAL_COMMUNITY): Payer: Medicare PPO

## 2022-05-05 ENCOUNTER — Other Ambulatory Visit: Payer: Self-pay

## 2022-05-05 ENCOUNTER — Encounter (HOSPITAL_COMMUNITY): Payer: Self-pay | Admitting: Student

## 2022-05-05 DIAGNOSIS — Z85828 Personal history of other malignant neoplasm of skin: Secondary | ICD-10-CM | POA: Insufficient documentation

## 2022-05-05 DIAGNOSIS — N189 Chronic kidney disease, unspecified: Secondary | ICD-10-CM | POA: Diagnosis not present

## 2022-05-05 DIAGNOSIS — Z79899 Other long term (current) drug therapy: Secondary | ICD-10-CM | POA: Insufficient documentation

## 2022-05-05 DIAGNOSIS — S42351A Displaced comminuted fracture of shaft of humerus, right arm, initial encounter for closed fracture: Secondary | ICD-10-CM | POA: Insufficient documentation

## 2022-05-05 DIAGNOSIS — G4733 Obstructive sleep apnea (adult) (pediatric): Secondary | ICD-10-CM | POA: Diagnosis not present

## 2022-05-05 DIAGNOSIS — W19XXXA Unspecified fall, initial encounter: Secondary | ICD-10-CM | POA: Diagnosis not present

## 2022-05-05 DIAGNOSIS — I4891 Unspecified atrial fibrillation: Secondary | ICD-10-CM | POA: Insufficient documentation

## 2022-05-05 DIAGNOSIS — Z7901 Long term (current) use of anticoagulants: Secondary | ICD-10-CM | POA: Insufficient documentation

## 2022-05-05 DIAGNOSIS — S42201A Unspecified fracture of upper end of right humerus, initial encounter for closed fracture: Secondary | ICD-10-CM

## 2022-05-05 DIAGNOSIS — Z96642 Presence of left artificial hip joint: Secondary | ICD-10-CM | POA: Insufficient documentation

## 2022-05-05 DIAGNOSIS — I129 Hypertensive chronic kidney disease with stage 1 through stage 4 chronic kidney disease, or unspecified chronic kidney disease: Secondary | ICD-10-CM | POA: Insufficient documentation

## 2022-05-05 DIAGNOSIS — I10 Essential (primary) hypertension: Secondary | ICD-10-CM | POA: Diagnosis not present

## 2022-05-05 DIAGNOSIS — Z86718 Personal history of other venous thrombosis and embolism: Secondary | ICD-10-CM | POA: Diagnosis not present

## 2022-05-05 DIAGNOSIS — G8918 Other acute postprocedural pain: Secondary | ICD-10-CM | POA: Diagnosis not present

## 2022-05-05 DIAGNOSIS — Z9989 Dependence on other enabling machines and devices: Secondary | ICD-10-CM | POA: Diagnosis not present

## 2022-05-05 DIAGNOSIS — S42301A Unspecified fracture of shaft of humerus, right arm, initial encounter for closed fracture: Secondary | ICD-10-CM | POA: Diagnosis not present

## 2022-05-05 DIAGNOSIS — S42291A Other displaced fracture of upper end of right humerus, initial encounter for closed fracture: Secondary | ICD-10-CM

## 2022-05-05 HISTORY — DX: Personal history of urinary calculi: Z87.442

## 2022-05-05 HISTORY — DX: Other specified postprocedural states: Z98.890

## 2022-05-05 HISTORY — DX: Nausea with vomiting, unspecified: R11.2

## 2022-05-05 HISTORY — DX: Other complications of anesthesia, initial encounter: T88.59XA

## 2022-05-05 HISTORY — DX: Cardiac arrhythmia, unspecified: I49.9

## 2022-05-05 HISTORY — PX: HUMERUS IM NAIL: SHX1769

## 2022-05-05 HISTORY — DX: Cardiac murmur, unspecified: R01.1

## 2022-05-05 HISTORY — DX: Headache, unspecified: R51.9

## 2022-05-05 HISTORY — DX: Nonrheumatic mitral (valve) prolapse: I34.1

## 2022-05-05 LAB — PROTIME-INR
INR: 1.2 (ref 0.8–1.2)
Prothrombin Time: 15.4 seconds — ABNORMAL HIGH (ref 11.4–15.2)

## 2022-05-05 SURGERY — INSERTION, INTRAMEDULLARY ROD, HUMERUS
Anesthesia: Regional | Laterality: Right

## 2022-05-05 MED ORDER — HYDROCODONE-ACETAMINOPHEN 7.5-325 MG PO TABS
1.0000 | ORAL_TABLET | ORAL | Status: DC | PRN
  Administered 2022-05-06: 2 via ORAL
  Filled 2022-05-05: qty 2

## 2022-05-05 MED ORDER — LACTATED RINGERS IV SOLN: INTRAVENOUS | Status: DC | PRN

## 2022-05-05 MED ORDER — DOCUSATE SODIUM 100 MG PO CAPS
100.0000 mg | ORAL_CAPSULE | Freq: Two times a day (BID) | ORAL | Status: DC
  Administered 2022-05-05 – 2022-05-07 (×4): 100 mg via ORAL
  Filled 2022-05-05 (×4): qty 1

## 2022-05-05 MED ORDER — ROCURONIUM BROMIDE 10 MG/ML (PF) SYRINGE
PREFILLED_SYRINGE | INTRAVENOUS | Status: DC | PRN
  Administered 2022-05-05: 50 mg via INTRAVENOUS

## 2022-05-05 MED ORDER — FENTANYL CITRATE (PF) 250 MCG/5ML IJ SOLN
INTRAMUSCULAR | Status: AC
  Filled 2022-05-05: qty 5

## 2022-05-05 MED ORDER — CHLORHEXIDINE GLUCONATE 0.12 % MT SOLN: 15.0000 mL | OROMUCOSAL | Status: AC

## 2022-05-05 MED ORDER — ALLOPURINOL 100 MG PO TABS
100.0000 mg | ORAL_TABLET | Freq: Every day | ORAL | Status: DC
  Administered 2022-05-05 – 2022-05-07 (×3): 100 mg via ORAL
  Filled 2022-05-05 (×3): qty 1

## 2022-05-05 MED ORDER — SERTRALINE HCL 25 MG PO TABS
25.0000 mg | ORAL_TABLET | Freq: Three times a day (TID) | ORAL | Status: DC
  Administered 2022-05-06 – 2022-05-07 (×4): 25 mg via ORAL
  Filled 2022-05-05 (×4): qty 1

## 2022-05-05 MED ORDER — CEFAZOLIN SODIUM-DEXTROSE 2-4 GM/100ML-% IV SOLN
2.0000 g | Freq: Four times a day (QID) | INTRAVENOUS | Status: AC
  Administered 2022-05-05 – 2022-05-06 (×3): 2 g via INTRAVENOUS
  Filled 2022-05-05 (×3): qty 100

## 2022-05-05 MED ORDER — GABAPENTIN 400 MG PO CAPS
800.0000 mg | ORAL_CAPSULE | Freq: Three times a day (TID) | ORAL | Status: DC
  Administered 2022-05-05 – 2022-05-07 (×5): 800 mg via ORAL
  Filled 2022-05-05 (×5): qty 2

## 2022-05-05 MED ORDER — MORPHINE SULFATE (PF) 2 MG/ML IV SOLN: 0.5000 mg | INTRAVENOUS | Status: DC | PRN

## 2022-05-05 MED ORDER — FENTANYL CITRATE (PF) 250 MCG/5ML IJ SOLN
INTRAMUSCULAR | Status: DC | PRN
Start: 2022-05-05 — End: 2022-05-05
  Administered 2022-05-05: 50 ug via INTRAVENOUS

## 2022-05-05 MED ORDER — PANTOPRAZOLE SODIUM 40 MG PO TBEC
40.0000 mg | DELAYED_RELEASE_TABLET | Freq: Every day | ORAL | Status: DC
  Administered 2022-05-05 – 2022-05-07 (×3): 40 mg via ORAL
  Filled 2022-05-05 (×3): qty 1

## 2022-05-05 MED ORDER — ONDANSETRON HCL 4 MG/2ML IJ SOLN: 4.0000 mg | Freq: Four times a day (QID) | INTRAMUSCULAR | Status: DC | PRN

## 2022-05-05 MED ORDER — EZETIMIBE 10 MG PO TABS
10.0000 mg | ORAL_TABLET | Freq: Every day | ORAL | Status: DC
  Administered 2022-05-06 – 2022-05-07 (×2): 10 mg via ORAL
  Filled 2022-05-05 (×2): qty 1

## 2022-05-05 MED ORDER — PROPOFOL 10 MG/ML IV BOLUS
INTRAVENOUS | Status: DC | PRN
  Administered 2022-05-05: 110 mg via INTRAVENOUS
  Administered 2022-05-05: 40 mg via INTRAVENOUS

## 2022-05-05 MED ORDER — CHLORHEXIDINE GLUCONATE 0.12 % MT SOLN
OROMUCOSAL | Status: AC
  Administered 2022-05-05: 15 mL via OROMUCOSAL
  Filled 2022-05-05: qty 15

## 2022-05-05 MED ORDER — ROSUVASTATIN CALCIUM 20 MG PO TABS
40.0000 mg | ORAL_TABLET | Freq: Every day | ORAL | Status: DC
Start: 2022-05-05 — End: 2022-05-07
  Administered 2022-05-05 – 2022-05-06 (×2): 40 mg via ORAL
  Filled 2022-05-05 (×2): qty 2

## 2022-05-05 MED ORDER — ROCURONIUM BROMIDE 10 MG/ML (PF) SYRINGE
PREFILLED_SYRINGE | INTRAVENOUS | Status: AC
  Filled 2022-05-05: qty 10

## 2022-05-05 MED ORDER — HYDRALAZINE HCL 25 MG PO TABS: 25.0000 mg | ORAL_TABLET | Freq: Three times a day (TID) | ORAL | Status: DC | PRN

## 2022-05-05 MED ORDER — ACETAMINOPHEN 325 MG PO TABS
325.0000 mg | ORAL_TABLET | Freq: Four times a day (QID) | ORAL | Status: DC | PRN
  Administered 2022-05-06: 650 mg via ORAL
  Filled 2022-05-05: qty 2

## 2022-05-05 MED ORDER — ONDANSETRON HCL 4 MG/2ML IJ SOLN
INTRAMUSCULAR | Status: AC
  Filled 2022-05-05: qty 2

## 2022-05-05 MED ORDER — FLUTICASONE PROPIONATE 50 MCG/ACT NA SUSP
2.0000 | Freq: Every day | NASAL | Status: DC
  Administered 2022-05-07: 2 via NASAL
  Filled 2022-05-05: qty 16

## 2022-05-05 MED ORDER — METOCLOPRAMIDE HCL 5 MG/ML IJ SOLN: 5.0000 mg | Freq: Three times a day (TID) | INTRAMUSCULAR | Status: DC | PRN

## 2022-05-05 MED ORDER — SUGAMMADEX SODIUM 200 MG/2ML IV SOLN
INTRAVENOUS | Status: DC | PRN
  Administered 2022-05-05: 200 mg via INTRAVENOUS

## 2022-05-05 MED ORDER — APIXABAN 5 MG PO TABS: 5.0000 mg | ORAL_TABLET | Freq: Two times a day (BID) | ORAL | Status: DC

## 2022-05-05 MED ORDER — DEXAMETHASONE SODIUM PHOSPHATE 10 MG/ML IJ SOLN
INTRAMUSCULAR | Status: AC
  Filled 2022-05-05: qty 1

## 2022-05-05 MED ORDER — CYCLOBENZAPRINE HCL 5 MG PO TABS: 5.0000 mg | ORAL_TABLET | Freq: Three times a day (TID) | ORAL | Status: DC | PRN

## 2022-05-05 MED ORDER — CEFAZOLIN SODIUM-DEXTROSE 2-4 GM/100ML-% IV SOLN
2.0000 g | INTRAVENOUS | Status: AC
  Administered 2022-05-05: 2 g via INTRAVENOUS
  Filled 2022-05-05: qty 100

## 2022-05-05 MED ORDER — DEXAMETHASONE SODIUM PHOSPHATE 10 MG/ML IJ SOLN
INTRAMUSCULAR | Status: DC | PRN
  Administered 2022-05-05: 4 mg via INTRAVENOUS

## 2022-05-05 MED ORDER — PHENYLEPHRINE 80 MCG/ML (10ML) SYRINGE FOR IV PUSH (FOR BLOOD PRESSURE SUPPORT)
PREFILLED_SYRINGE | INTRAVENOUS | Status: DC | PRN
  Administered 2022-05-05: 120 ug via INTRAVENOUS
  Administered 2022-05-05: 80 ug via INTRAVENOUS
  Administered 2022-05-05: 120 ug via INTRAVENOUS
  Administered 2022-05-05 (×3): 80 ug via INTRAVENOUS

## 2022-05-05 MED ORDER — LIDOCAINE 2% (20 MG/ML) 5 ML SYRINGE
INTRAMUSCULAR | Status: DC | PRN
  Administered 2022-05-05: 100 mg via INTRAVENOUS

## 2022-05-05 MED ORDER — PROPOFOL 10 MG/ML IV BOLUS
INTRAVENOUS | Status: AC
  Filled 2022-05-05: qty 20

## 2022-05-05 MED ORDER — METOPROLOL SUCCINATE ER 25 MG PO TB24
25.0000 mg | ORAL_TABLET | Freq: Every day | ORAL | Status: DC
  Administered 2022-05-06 – 2022-05-07 (×2): 25 mg via ORAL
  Filled 2022-05-05 (×2): qty 1

## 2022-05-05 MED ORDER — PHENYLEPHRINE HCL-NACL 20-0.9 MG/250ML-% IV SOLN
INTRAVENOUS | Status: DC | PRN
  Administered 2022-05-05: 25 ug/min via INTRAVENOUS

## 2022-05-05 MED ORDER — VANCOMYCIN HCL 1000 MG IV SOLR
INTRAVENOUS | Status: DC | PRN
  Administered 2022-05-05: 1000 mg via TOPICAL

## 2022-05-05 MED ORDER — LACTATED RINGERS IV SOLN: INTRAVENOUS | Status: DC

## 2022-05-05 MED ORDER — ONDANSETRON HCL 4 MG PO TABS: 4.0000 mg | ORAL_TABLET | Freq: Four times a day (QID) | ORAL | Status: DC | PRN

## 2022-05-05 MED ORDER — CLONIDINE HCL (ANALGESIA) 100 MCG/ML EP SOLN
EPIDURAL | Status: DC | PRN
  Administered 2022-05-05: 100 ug

## 2022-05-05 MED ORDER — LIDOCAINE 2% (20 MG/ML) 5 ML SYRINGE
INTRAMUSCULAR | Status: AC
  Filled 2022-05-05: qty 5

## 2022-05-05 MED ORDER — HYDROCODONE-ACETAMINOPHEN 5-325 MG PO TABS
1.0000 | ORAL_TABLET | ORAL | Status: DC | PRN
  Administered 2022-05-06 – 2022-05-07 (×5): 2 via ORAL
  Filled 2022-05-05 (×5): qty 2

## 2022-05-05 MED ORDER — 0.9 % SODIUM CHLORIDE (POUR BTL) OPTIME
TOPICAL | Status: DC | PRN
  Administered 2022-05-05: 1000 mL

## 2022-05-05 MED ORDER — VANCOMYCIN HCL 1000 MG IV SOLR
INTRAVENOUS | Status: AC
  Filled 2022-05-05: qty 20

## 2022-05-05 MED ORDER — PHENYLEPHRINE 80 MCG/ML (10ML) SYRINGE FOR IV PUSH (FOR BLOOD PRESSURE SUPPORT)
PREFILLED_SYRINGE | INTRAVENOUS | Status: AC
  Filled 2022-05-05: qty 10

## 2022-05-05 MED ORDER — ONDANSETRON HCL 4 MG/2ML IJ SOLN
INTRAMUSCULAR | Status: DC | PRN
  Administered 2022-05-05: 4 mg via INTRAVENOUS

## 2022-05-05 MED ORDER — METOCLOPRAMIDE HCL 5 MG PO TABS: 5.0000 mg | ORAL_TABLET | Freq: Three times a day (TID) | ORAL | Status: DC | PRN

## 2022-05-05 MED ORDER — ROPIVACAINE HCL 5 MG/ML IJ SOLN
INTRAMUSCULAR | Status: DC | PRN
  Administered 2022-05-05: 30 mL via PERINEURAL

## 2022-05-05 SURGICAL SUPPLY — 63 items
BAG COUNTER SPONGE SURGICOUNT (BAG) ×2 IMPLANT
BIT DRILL 10 HOLLOW (BIT) ×1 IMPLANT
BIT DRILL 3.8X270 (BIT) ×1 IMPLANT
BIT DRILL 6.5 CONICAL (BIT) ×1 IMPLANT
BIT DRILL SHORT 3.2MM (DRILL) IMPLANT
BRUSH SCRUB EZ PLAIN DRY (MISCELLANEOUS) ×3 IMPLANT
CHLORAPREP W/TINT 26 (MISCELLANEOUS) ×2 IMPLANT
COVER SURGICAL LIGHT HANDLE (MISCELLANEOUS) ×3 IMPLANT
DERMABOND ADVANCED (GAUZE/BANDAGES/DRESSINGS) ×1
DERMABOND ADVANCED .7 DNX12 (GAUZE/BANDAGES/DRESSINGS) IMPLANT
DRAPE C-ARM 42X72 X-RAY (DRAPES) ×2 IMPLANT
DRAPE C-ARMOR (DRAPES) ×2 IMPLANT
DRAPE IMP U-DRAPE 54X76 (DRAPES) ×2 IMPLANT
DRAPE INCISE IOBAN 66X45 STRL (DRAPES) ×2 IMPLANT
DRAPE U-SHAPE 47X51 STRL (DRAPES) ×2 IMPLANT
DRESSING MEPILEX FLEX 4X4 (GAUZE/BANDAGES/DRESSINGS) IMPLANT
DRILL SHORT 3.2MM (DRILL) ×2
DRSG ADAPTIC 3X8 NADH LF (GAUZE/BANDAGES/DRESSINGS) ×1 IMPLANT
DRSG MEPILEX BORDER 4X8 (GAUZE/BANDAGES/DRESSINGS) ×1 IMPLANT
DRSG MEPILEX FLEX 4X4 (GAUZE/BANDAGES/DRESSINGS) ×6
DRSG PAD ABDOMINAL 8X10 ST (GAUZE/BANDAGES/DRESSINGS) ×3 IMPLANT
ELECT REM PT RETURN 9FT ADLT (ELECTROSURGICAL)
ELECTRODE REM PT RTRN 9FT ADLT (ELECTROSURGICAL) IMPLANT
GAUZE SPONGE 4X4 12PLY STRL (GAUZE/BANDAGES/DRESSINGS) ×1 IMPLANT
GLOVE BIO SURGEON STRL SZ 6.5 (GLOVE) ×6 IMPLANT
GLOVE BIO SURGEON STRL SZ7.5 (GLOVE) ×8 IMPLANT
GLOVE BIOGEL PI IND STRL 6.5 (GLOVE) ×1 IMPLANT
GLOVE BIOGEL PI IND STRL 7.5 (GLOVE) ×1 IMPLANT
GLOVE BIOGEL PI INDICATOR 6.5 (GLOVE) ×1
GLOVE BIOGEL PI INDICATOR 7.5 (GLOVE) ×1
GOWN STRL REUS W/ TWL LRG LVL3 (GOWN DISPOSABLE) ×2 IMPLANT
GOWN STRL REUS W/ TWL XL LVL3 (GOWN DISPOSABLE) ×1 IMPLANT
GOWN STRL REUS W/TWL LRG LVL3 (GOWN DISPOSABLE) ×4
GOWN STRL REUS W/TWL XL LVL3 (GOWN DISPOSABLE) ×2
GUIDEROD SS 2.5MMX280MM (ROD) ×1 IMPLANT
KIT BASIN OR (CUSTOM PROCEDURE TRAY) ×2 IMPLANT
KIT TURNOVER KIT B (KITS) ×2 IMPLANT
MANIFOLD NEPTUNE II (INSTRUMENTS) ×2 IMPLANT
NAIL IM CANN LNG HUM R 8.5X300 (Nail) ×1 IMPLANT
NS IRRIG 1000ML POUR BTL (IV SOLUTION) ×2 IMPLANT
PACK TOTAL JOINT (CUSTOM PROCEDURE TRAY) ×2 IMPLANT
PAD ARMBOARD 7.5X6 YLW CONV (MISCELLANEOUS) ×4 IMPLANT
ROD REAMING 2.5 (INSTRUMENTS) ×1 IMPLANT
SCREW LOCK 4.5X44 (Screw) ×1 IMPLANT
SCREW LOCK 4X30 TI (Screw) ×1 IMPLANT
SCREW LOCK MULTILOC FT 4.5X46 (Screw) ×1 IMPLANT
SCREW TI MULTILOC 4.5X42 (Screw) ×1 IMPLANT
SLEEVE SURGEON STRL (DRAPES) ×1 IMPLANT
STAPLER VISISTAT 35W (STAPLE) ×1 IMPLANT
SUCTION FRAZIER HANDLE 10FR (MISCELLANEOUS) ×2
SUCTION TUBE FRAZIER 10FR DISP (MISCELLANEOUS) ×1 IMPLANT
SUT FIBERWIRE 2-0 18 17.9 3/8 (SUTURE)
SUT MNCRL AB 3-0 PS2 18 (SUTURE) ×3 IMPLANT
SUT MON AB 2-0 CT1 36 (SUTURE) ×1 IMPLANT
SUT VIC AB 0 CT1 27 (SUTURE) ×2
SUT VIC AB 0 CT1 27XBRD ANBCTR (SUTURE) ×2 IMPLANT
SUT VIC AB 2-0 CT1 27 (SUTURE) ×2
SUT VIC AB 2-0 CT1 27XBRD (SUTURE) ×2 IMPLANT
SUT VIC AB 2-0 CT3 27 (SUTURE) IMPLANT
SUTURE FIBERWR 2-0 18 17.9 3/8 (SUTURE) IMPLANT
TRAY FOLEY MTR SLVR 16FR STAT (SET/KITS/TRAYS/PACK) IMPLANT
WATER STERILE IRR 1000ML POUR (IV SOLUTION) ×2 IMPLANT
YANKAUER SUCT BULB TIP NO VENT (SUCTIONS) ×2 IMPLANT

## 2022-05-05 NOTE — Anesthesia Procedure Notes (Signed)
Procedure Name: Intubation Date/Time: 05/05/2022 8:41 AM  Performed by: Jenne Campus, CRNAPre-anesthesia Checklist: Patient identified, Emergency Drugs available, Suction available and Patient being monitored Patient Re-evaluated:Patient Re-evaluated prior to induction Oxygen Delivery Method: Circle System Utilized Preoxygenation: Pre-oxygenation with 100% oxygen Induction Type: IV induction Ventilation: Mask ventilation without difficulty Laryngoscope Size: Miller and 2 Grade View: Grade I Tube type: Oral Tube size: 7.0 mm Number of attempts: 1 Airway Equipment and Method: Stylet and Oral airway Placement Confirmation: ETT inserted through vocal cords under direct vision, positive ETCO2 and breath sounds checked- equal and bilateral Secured at: 22 cm Tube secured with: Tape Dental Injury: Teeth and Oropharynx as per pre-operative assessment

## 2022-05-05 NOTE — Transfer of Care (Signed)
Immediate Anesthesia Transfer of Care Note  Patient: Sarah Riddle  Procedure(s) Performed: INTRAMEDULLARY (IM) NAIL HUMERAL (Right)  Patient Location: PACU  Anesthesia Type:General and Regional  Level of Consciousness: oriented, drowsy and patient cooperative  Airway & Oxygen Therapy: Patient Spontanous Breathing and Patient connected to nasal cannula oxygen  Post-op Assessment: Report given to RN and Post -op Vital signs reviewed and stable  Post vital signs: Reviewed  Last Vitals:  Vitals Value Taken Time  BP 128/53 05/05/22 1055  Temp    Pulse 83 05/05/22 1057  Resp 15 05/05/22 1057  SpO2 90 % 05/05/22 1057  Vitals shown include unvalidated device data.  Last Pain:  Vitals:   05/05/22 0647  TempSrc:   PainSc: 4       Patients Stated Pain Goal: 4 (07/20/50 0258)  Complications: No notable events documented.

## 2022-05-05 NOTE — H&P (Signed)
[facility] (OTS) Consult   Patient ID: Sarah Riddle MRN: [id number] DOB/AGE: [birth year] 81 y.o.  Reason for Surgery: R proximal humerus fracture   HPI: Sarah Riddle is an 81 y.o. female who presents for fixation of the right humerus.  She had a ground-level fall sustaining a right proximal humerus fracture.  Due to the complexity of her injury she was sent to me for evaluation and treatment.  She is on Eliquis for atrial fibrillation.  She stopped this Monday.  Currently a lot of pain in her coaptation splint.  She lives at home with her daughter.  She is right-hand dominant.  Past Medical History:  Diagnosis Date   A-fib [facility])    Age related osteoporosis    Cancer ([place])    Melanoma x 2 -- Lt Leg -1976 Rt Arm - 2014   Chronic bilateral low back pain    Chronic kidney disease    Complication of anesthesia    DVT (deep venous thrombosis) ([place]) 1960   right leg  was on BC pills   Dysrhythmia    Family history of breast cancer    Family history of colon cancer    Family history of melanoma    Family history of prostate cancer    Fibrocystic breast disease    GERD (gastroesophageal reflux disease)    Headache    migraine   Heart murmur    not heard now.   History of kidney stones    Hyperlipidemia    Hypertension    IBS (irritable bowel syndrome)    Lumbago    Mitral valve prolapse    Neuropathy    Obstructive sleep apnea    Osteoarthritis of shoulder    PONV (postoperative nausea and vomiting)     Past Surgical History:  Procedure Laterality Date   ABDOMINAL HYSTERECTOMY     BACK SURGERY     BREAST CYST ASPIRATION Left 2001   Negative   CARDIAC ELECTROPHYSIOLOGY MAPPING AND ABLATION     EYE SURGERY     HYSTEROTOMY     L4-L5  2020   titaum rebok cage   TOTAL HIP ARTHROPLASTY Left     Family History  Problem Relation Age of Onset   Colon cancer Mother 52   Prostate cancer Father        dx 54s   Cancer Sister    Acute myelogenous  leukemia Sister        dx 68s   Prostate cancer Brother    Melanoma Brother    Prostate cancer Brother    Melanoma Brother    HIV/AIDS Brother 39   Tuberculosis Paternal Aunt    Melanoma Other    Breast cancer Other    Melanoma Niece     Social History:  reports that she has never smoked. She has never used smokeless tobacco. She reports that she does not drink alcohol and does not use drugs.  Allergies:  Allergies  Allergen Reactions   Iodinated Contrast Media Shortness Of Breath    Can be premedicated   Ibuprofen Other (See Comments)    "stomach problems"    Lubiprostone Nausea And Vomiting        Sulfa Antibiotics Other (See Comments)    "Stomach problems"    Zolpidem Other (See Comments)    "hallucinations"  Other reaction(s): Confusion   Boniva [Ibandronic Acid] Diarrhea and Nausea And Vomiting   Celecoxib Other (See Comments)    GI upset  Doxycycline Diarrhea and Nausea And Vomiting   Duloxetine Other (See Comments)    Over sedation with Zelnorm (IBS med)   Tetracyclines & Related Nausea Only and Nausea And Vomiting    Medications:  No current facility-administered medications on file prior to encounter.   Current Outpatient Medications on File Prior to Encounter  Medication Sig Dispense Refill   acetaminophen (TYLENOL) 500 MG tablet Take 500 mg by mouth every 8 (eight) hours as needed for moderate pain.     alendronate (FOSAMAX) 70 MG tablet Take 70 mg by mouth every Tuesday. Take with a full glass of water on an empty stomach.     allopurinol (ZYLOPRIM) 100 MG tablet Take 100 mg by mouth daily.     apixaban (ELIQUIS) 5 MG TABS tablet Take 5 mg by mouth 2 (two) times daily.     Calcium Carbonate-Vitamin D (CALTRATE 600+D PO) Take 1 tablet by mouth daily.     Cranberry 125 MG TABS Take 125 mg by mouth daily.     cyclobenzaprine (FLEXERIL) 5 MG tablet Take 1 tablet (5 mg total) by mouth 3 (three) times daily as needed. 15 tablet 0   ezetimibe (ZETIA) 10  MG tablet Take 10 mg by mouth daily.     fluticasone (FLONASE) 50 MCG/ACT nasal spray Place 2 sprays into both nostrils daily.     gabapentin (NEURONTIN) 800 MG tablet Take 800 mg by mouth in the morning, at noon, in the evening, and at bedtime.     hydrALAZINE (APRESOLINE) 25 MG tablet Take 25 mg by mouth in the morning and at bedtime.     isosorbide mononitrate (IMDUR) 60 MG 24 hr tablet Take 60 mg by mouth daily.     levocetirizine (XYZAL) 5 MG tablet Take 5 mg by mouth every evening.     metoprolol succinate (TOPROL-XL) 25 MG 24 hr tablet Take 25 mg by mouth daily.     nitroGLYCERIN (NITROSTAT) 0.4 MG SL tablet Place 0.4 mg under the tongue every 5 (five) minutes as needed for chest pain.     ondansetron (ZOFRAN-ODT) 4 MG disintegrating tablet Take 1 tablet (4 mg total) by mouth every 8 (eight) hours as needed for nausea or vomiting. 15 tablet 0   pantoprazole (PROTONIX) 40 MG tablet Take 40 mg by mouth daily.     rosuvastatin (CRESTOR) 40 MG tablet Take 40 mg by mouth at bedtime.     sertraline (ZOLOFT) 25 MG tablet Take 25 mg by mouth 3 (three) times daily.      tetrahydrozoline-zinc (VISINE-AC) 0.05-0.25 % ophthalmic solution Place 2 drops into both eyes 3 (three) times daily as needed (dry eyes).     lidocaine (LIDODERM) 5 % Place 1 patch onto the skin daily. Remove & Discard patch within 12 hours or as directed by MD (Patient not taking: Reported on 05/03/2022) 12 patch 0     ROS: Constitutional: No fever or chills Vision: No changes in vision ENT: No difficulty swallowing CV: No chest pain Pulm: No SOB or wheezing GI: No nausea or vomiting GU: No urgency or inability to hold urine Skin: No poor wound healing Neurologic: No numbness or tingling Psychiatric: No depression or anxiety Heme: No bruising Allergic: No reaction to medications or food   Exam: Blood pressure (!) 148/60, pulse 93, temperature 98.3 F (36.8 C), temperature source Oral, resp. rate 18, height '5\' 11"'$  (1.803  m), weight 93 kg, SpO2 95 %. General: No acute distress Orientation: Awake alert and oriented x3 Mood  and Affect: Cooperative and pleasant Gait: Some unsteadiness and weakness in her gait Coordination and balance: Within normal limits  Right upper extremity: Splint is in place it is clean dry and intact.  Compartments are soft compressible.  She is not able to tolerate any range of motion of the shoulder or arm.  She is neurovascular intact to the right upper extremity.  Left upper extremity: Skin without lesions. No tenderness to palpation. Full painless ROM, full strength in each muscle groups without evidence of instability.   Medical Decision Making: Data: Imaging: X-rays show a comminuted proximal humeral shaft fracture and angulation.  Labs:  Results for orders placed or performed during the hospital encounter of 05/05/22 (from the past 24 hour(s))  Protime-INR     Status: Abnormal   Collection Time: 05/05/22  6:33 AM  Result Value Ref Range   Prothrombin Time 15.4 (H) 11.4 - 15.2 seconds   INR 1.2 0.8 - 1.2    Imaging or Labs ordered: None  Medical history and chart was reviewed and case discussed with medical provider.  Assessment/Plan: 81 year old female with a right proximal humerus fracture.  Due to the long segment nature of her fracture I recommend proceeding with intramedullary nailing of her right humerus.  Risks and benefits were discussed with the patient.  Risks include but not limited to bleeding, infection, malunion, nonunion, hardware failure, hardware irritation, nerve DVT, even the possibility anesthetic complications.  She agrees to proceed with surgery and consent was obtained.  Shona Needles, MD [facility] [phone] (office) [email or web address]

## 2022-05-05 NOTE — Anesthesia Postprocedure Evaluation (Signed)
Anesthesia Post Note  Patient: Sarah Riddle  Procedure(s) Performed: INTRAMEDULLARY (IM) NAIL HUMERAL (Right)     Patient location during evaluation: PACU Anesthesia Type: Regional and General Level of consciousness: awake and alert Pain management: pain level controlled Vital Signs Assessment: post-procedure vital signs reviewed and stable Respiratory status: spontaneous breathing, nonlabored ventilation, respiratory function stable and patient connected to nasal cannula oxygen Cardiovascular status: blood pressure returned to baseline and stable Postop Assessment: no apparent nausea or vomiting Anesthetic complications: no   No notable events documented.  Last Vitals:  Vitals:   05/05/22 1355 05/05/22 1425  BP: (!) 105/50 (!) 113/53  Pulse: 64 65  Resp: 16 18  Temp:    SpO2: 96% 96%    Last Pain:  Vitals:   05/05/22 1425  TempSrc:   PainSc: 0-No pain                 Barnet Glasgow

## 2022-05-05 NOTE — Plan of Care (Signed)

## 2022-05-05 NOTE — Interval H&P Note (Signed)
History and Physical Interval Note:  05/05/2022 8:10 AM  Sarah Riddle  has presented today for surgery, with the diagnosis of Right proximal humerus fracture.  The various methods of treatment have been discussed with the patient and family. After consideration of risks, benefits and other options for treatment, the patient has consented to  Procedure(s): INTRAMEDULLARY (IM) NAIL HUMERAL (Right) as a surgical intervention.  The patient's history has been reviewed, patient examined, no change in status, stable for surgery.  I have reviewed the patient's chart and labs.  Questions were answered to the patient's satisfaction.     Lennette Bihari P Gaston Dase

## 2022-05-05 NOTE — Op Note (Signed)
Orthopaedic Surgery Operative Note (CSN: [id number] ) Date of Surgery: 05/05/2022  Admit Date: 05/05/2022   Diagnoses: Pre-Op Diagnoses: Right proximal humerus fracture with humeral shaft extension  Post-Op Diagnosis: Same  Procedures: CPT 23615-Open repair of right proximal humerus fracture CPT 24516-Intramedullary nailing of right humerus fracture   Surgeons : Primary: Shona Needles, MD  Assistant: Wandra Arthurs, RNFA  Location: OR 7   Anesthesia:General with regional block   Antibiotics: Ancef 2g preop with 1gm vancomycin powder placed topically   Tourniquet time: None used    Estimated Blood JGGE:366 mL  Complications:None   Specimens:None    Implants: Implant Name Type Inv. Item Serial No. Manufacturer Lot No. LRB No. Used Action  8.5MM  HUMERAL NAIL Nail   SYNTHES [id number] Right 1 Implanted  SCREW TI MULTILOC 4.5X42 - [id number] Screw SCREW TI MULTILOC 4.5X42  [employer]  Right 1 Implanted  SCREW LOCK 4.5X44 - [id number] Screw SCREW LOCK 4.5X44  [employer]  Right 1 Implanted  SCREW LOCK MULTILOC FT 4.5X46 - [id number] Screw SCREW LOCK MULTILOC FT 4.5X46  [employer]  Right 1 Implanted  SCREW LOCK 4X30 TI - [id number] Screw SCREW LOCK 4X30 TI  [employer]  Right 1 Implanted     Indications for Surgery: 81 year old female who had a right proximal humerus fracture I felt that she was indicated for intramedullary nailing of her right humerus.  Risks and benefits were discussed with the patient.  Risks included but not limited to bleeding, infection, malunion, nonunion, hardware failure, hardware irritation, nerve or blood vessel injury, DVT, even the possibility anesthetic complications.  She agreed to proceed with surgery and consent was obtained.  Operative Findings: 1.  Open repair of right proximal humerus fracture using [employer]ynthes 8.5 x 300 mm humeral nail 2.  Intramedullary nailing of right humeral shaft fracture using the same  implant.  Procedure: The patient was identified in the preoperative holding area. Consent was confirmed with the patient and their family and all questions were answered. The operative extremity was marked after confirmation with the patient. she was then brought back to the operating room by our anesthesia colleagues.  She was carefully transferred over to radiolucent flat top table.  She was placed under general anesthetic.  A bump was placed under her shoulder to elevate the shoulder blade.  Fluoroscopic imaging showed the unstable nature of her injury.  The right upper extremity was then prepped and draped in usual sterile fashion.  A timeout was performed to verify the patient, the procedure, and the extremity.  Preoperative antibiotics were dosed.  I made a small incision anterior lateral to the acromion went down through skin and subcutaneous tissue.  I incised through the deltoid fascia and split this in line with my incision.  I then exposed the rotator cuff and made a split in the rotator cuff to access the humeral head.  Using fluoroscopic guidance I then directed a threaded guidewire into the appropriate starting point and used an entry reamer to enter the humeral head and medullary canal.  I then used a ball-tipped guidewire the passed down the center of the canal into the distal shaft segment.  Unfortunately due to the comminution I was unable to pass it.  I then made a small percutaneous incision at the location of the fracture along an anterior lateral approach and split the biceps muscle in line and was able to manipulate the ball-tipped guidewire into the distal shaft segment.  I then seated it into the  distal metaphysis and measured the length.  I chose to use a 315 mm nail.  I then sequentially reamed from 6 mm to 9.5 mm and placed an 8.5 mm nail.  I attached to the targeting arm and placed it intramedullary.  Seated it until it was buried into the humeral head.  I then used the targeting  arm to place 3 proximal interlocking screws excellent fixation was obtained.  I then used perfect circle technique to place 1 anterior to posterior interlocking screw.  Final fluoroscopic imaging was then obtained.  The targeting arm was removed.  The incisions were copiously irrigated.  The rotator cuff split in the deltoid fascia was closed with 0 Vicryl suture.  The skin was closed with 2-0 Vicryl and 3-0 Monocryl and Dermabond.  Sterile dressings were applied.  The patient was placed in a sling.  She was awoken from anesthesia and taken to the PACU in stable condition.  Post Op Plan/Instructions: Patient will be nonweightbearing to the right upper extremity.  She will be in the sling for for comfort for the first 3 to 4 weeks.  She may have gentle passive range of motion and start some active range of motion after a week.  She will receive postoperative Ancef.  She will be restarted on the Eliquis starting tomorrow.  I was present and performed the entire surgery.  Katha Hamming, MD [facility]

## 2022-05-05 NOTE — Anesthesia Procedure Notes (Signed)
Anesthesia Regional Block: Interscalene brachial plexus block   Pre-Anesthetic Checklist: , timeout performed,  Correct Patient, Correct Site, Correct Laterality,  Correct Procedure, Correct Position, site marked,  Risks and benefits discussed,  Surgical consent,  Pre-op evaluation,  At surgeon's request and post-op pain management  Laterality: Upper and Right  Prep: Maximum Sterile Barrier Precautions used, chloraprep       Needles:  Injection technique: Single-shot  Needle Type: Echogenic Needle     Needle Length: 5cm  Needle Gauge: 21     Additional Needles:   Procedures:,,,, ultrasound used (permanent image in chart),,    Narrative:  Start time: 05/05/2022 8:20 AM End time: 05/05/2022 8:28 AM Injection made incrementally with aspirations every 5 mL.  Performed by: Personally  Anesthesiologist: Barnet Glasgow, MD  Additional Notes: Block assessed prior to procedure. Patient tolerated procedure well.

## 2022-05-06 DIAGNOSIS — Z86718 Personal history of other venous thrombosis and embolism: Secondary | ICD-10-CM | POA: Diagnosis not present

## 2022-05-06 DIAGNOSIS — Z85828 Personal history of other malignant neoplasm of skin: Secondary | ICD-10-CM | POA: Diagnosis not present

## 2022-05-06 DIAGNOSIS — I129 Hypertensive chronic kidney disease with stage 1 through stage 4 chronic kidney disease, or unspecified chronic kidney disease: Secondary | ICD-10-CM | POA: Diagnosis not present

## 2022-05-06 DIAGNOSIS — N189 Chronic kidney disease, unspecified: Secondary | ICD-10-CM | POA: Diagnosis not present

## 2022-05-06 DIAGNOSIS — I4891 Unspecified atrial fibrillation: Secondary | ICD-10-CM | POA: Diagnosis not present

## 2022-05-06 DIAGNOSIS — S42351A Displaced comminuted fracture of shaft of humerus, right arm, initial encounter for closed fracture: Secondary | ICD-10-CM | POA: Diagnosis not present

## 2022-05-06 DIAGNOSIS — S42201A Unspecified fracture of upper end of right humerus, initial encounter for closed fracture: Secondary | ICD-10-CM | POA: Diagnosis not present

## 2022-05-06 DIAGNOSIS — Z96642 Presence of left artificial hip joint: Secondary | ICD-10-CM | POA: Diagnosis not present

## 2022-05-06 DIAGNOSIS — Z7901 Long term (current) use of anticoagulants: Secondary | ICD-10-CM | POA: Diagnosis not present

## 2022-05-06 LAB — CBC
HCT: 23.4 % — ABNORMAL LOW (ref 36.0–46.0)
Hemoglobin: 7.2 g/dL — ABNORMAL LOW (ref 12.0–15.0)
MCH: 29.8 pg (ref 26.0–34.0)
MCHC: 30.8 g/dL (ref 30.0–36.0)
MCV: 96.7 fL (ref 80.0–100.0)
Platelets: 216 10*3/uL (ref 150–400)
RBC: 2.42 MIL/uL — ABNORMAL LOW (ref 3.87–5.11)
RDW: 13.2 % (ref 11.5–15.5)
WBC: 6.4 10*3/uL (ref 4.0–10.5)
nRBC: 0 % (ref 0.0–0.2)

## 2022-05-06 MED ORDER — SODIUM CHLORIDE 0.9 % IV BOLUS
250.0000 mL | Freq: Once | INTRAVENOUS | Status: AC
  Administered 2022-05-06: 250 mL via INTRAVENOUS

## 2022-05-06 MED ORDER — APIXABAN 5 MG PO TABS: 5.0000 mg | ORAL_TABLET | Freq: Two times a day (BID) | ORAL | Status: DC

## 2022-05-06 MED ORDER — POLYETHYLENE GLYCOL 3350 17 G PO PACK
17.0000 g | PACK | Freq: Every day | ORAL | Status: DC
  Administered 2022-05-06 – 2022-05-07 (×2): 17 g via ORAL
  Filled 2022-05-06 (×2): qty 1

## 2022-05-06 NOTE — Progress Notes (Signed)
Occupational Therapy Treatment Patient Details Name: Sarah Riddle MRN: [id number] DOB: 08/20/1941 Today's Date: 05/06/2022   History of present illness 81 y.o. female presenting to ED 8/2 after ground level fall. Imaging (+) for R proximal humerus fx. s/p IM nailing. PMHx significant for OSA on CPAP, recurrent falls with injury (most recent 02/2022), CAD, PAF s/p ablation 2019 on Coumadin, HTN, neuropathy, melanoma and chronic pain syndrome.   OT comments  Patient potentially d/c'ing later this date vs tomorrow. 2nd treatment session with focus on patient/family education on doffing/donning sling and RUE HEP with written handout. Re-education provided on R shoulder precautions. Patient able to return demo exercises with intermittent cueing for form. Able to doff/don R sling with light Min A. Daughter present at bedside throughout session. D/c disposition remains appropriate.    Recommendations for follow up therapy are one component of a multi-disciplinary discharge planning process, led by the attending physician.  Recommendations may be updated based on patient status, additional functional criteria and insurance authorization.    Follow Up Recommendations  Home health OT    Assistance Recommended at Discharge Frequent or constant Supervision/Assistance  Patient can return home with the following  A little help with walking and/or transfers;A little help with bathing/dressing/bathroom;Assistance with cooking/housework;Assist for transportation;Help with stairs or ramp for entrance   Equipment Recommendations  None recommended by OT    Recommendations for Other Services      Precautions / Restrictions Precautions Precautions: Fall;Shoulder Type of Shoulder Precautions: PROM only at shoulder x1 week followed by light AROM for 3-4wk; no restrictions at elbow, wrist or digits; sling for comfort Shoulder Interventions: Shoulder sling/immobilizer;Off for  dressing/bathing/exercises Precaution Booklet Issued: Yes (comment) Precaution Comments: Patient/daughter able to recall shoulder precatuions after initial education. Required Braces or Orthoses: Sling Restrictions Weight Bearing Restrictions: Yes RUE Weight Bearing: Non weight bearing       Mobility Bed Mobility Overal bed mobility: Needs Assistance Bed Mobility: Supine to Sit     Supine to sit: Supervision, HOB elevated     General bed mobility comments: Remained in bed for instruction on RUE HEP. HOB elevated and bed in chair position.    Transfers Overall transfer level: Needs assistance Equipment used: 1 person hand held assist Transfers: Sit to/from Stand Sit to Stand: Min guard           General transfer comment: Min guard for steadying from slightly elevated EOB.     Balance Overall balance assessment: Needs assistance Sitting-balance support: No upper extremity supported, Feet supported Sitting balance-Leahy Scale: Fair     Standing balance support: Single extremity supported, During functional activity, Reliant on assistive device for balance Standing balance-Leahy Scale: Poor                             ADL either performed or assessed with clinical judgement   ADL Overall ADL's : Needs assistance/impaired Eating/Feeding: Set up;Sitting   Grooming: Set up;Sitting   Upper Body Bathing: Minimal assistance;Sitting   Lower Body Bathing: Minimal assistance;Sit to/from stand   Upper Body Dressing : Minimal assistance;Sitting Upper Body Dressing Details (indicate cue type and reason): Able to doff/don R sling with light Min A and Min cues for sequencing/technique. Lower Body Dressing: Minimal assistance;Sit to/from stand Lower Body Dressing Details (indicate cue type and reason): Able to adjust footwear seated EOB in figure-4 position. Requires increased time and cues for technique. Toilet Transfer: Magazine features editor Details  (indicate cue  type and reason): Simulated with transfer to recliner with HHA+1. Mild LOB with turning. Min guard for steadying without AD. Toileting- Water quality scientist and Hygiene: Min guard;Sit to/from stand         General ADL Comments: Session with focus on RUE HEP as detailed below.    Extremity/Trunk Assessment Upper Extremity Assessment Upper Extremity Assessment: RUE deficits/detail RUE Deficits / Details: AROM WFL at elbow, wrist and digits. Did not assess at shoulder 2/2 precautions. RUE: Unable to fully assess due to immobilization;Shoulder pain at rest RUE Sensation: decreased light touch RUE Coordination: decreased fine motor;decreased gross motor LUE Deficits / Details: AROM WFL LUE Sensation: WNL LUE Coordination: WNL   Lower Extremity Assessment Lower Extremity Assessment: Generalized weakness   Cervical / Trunk Assessment Cervical / Trunk Assessment: Normal    Vision Baseline Vision/History: 1 Wears glasses Ability to See in Adequate Light: 0 Adequate Patient Visual Report: No change from baseline Vision Assessment?: No apparent visual deficits   Perception     Praxis      Cognition Arousal/Alertness: Awake/alert Behavior During Therapy: WFL for tasks assessed/performed Overall Cognitive Status: Within Functional Limits for tasks assessed                                          Exercises Exercises: Shoulder Shoulder Exercises Elbow Flexion: AAROM, 10 reps, Right Elbow Extension: AAROM, 10 reps, Right Wrist Flexion: AROM, 10 reps, Right Wrist Extension: AROM, 10 reps, Right Digit Composite Flexion: AROM, 10 reps, Right Composite Extension: AROM, 10 reps, Right    Shoulder Instructions Shoulder Instructions Donning/doffing shirt without moving shoulder: Minimal assistance Method for sponge bathing under operated UE: Set-up Donning/doffing sling/immobilizer: Minimal assistance Correct positioning of sling/immobilizer: Minimal  assistance Pendulum exercises (written home exercise program): Supervision/safety ROM for elbow, wrist and digits of operated UE: Supervision/safety Sling wearing schedule (on at all times/off for ADL's): Supervision/safety Proper positioning of operated UE when showering: Supervision/safety Positioning of UE while sleeping: Supervision/safety     General Comments Daughter present at bedside throughout education session.    Pertinent Vitals/ Pain       Pain Assessment Pain Assessment: 0-10 Pain Score: 6  Pain Location: R shoulder Pain Descriptors / Indicators: Sharp, Constant, Grimacing, Guarding Pain Intervention(s): Limited activity within patient's tolerance, Monitored during session, Repositioned  Home Living Family/patient expects to be discharged to:: Private residence Living Arrangements: Spouse/significant other;Children (Spouse and daughter) Available Help at Discharge: Family;Available 24 hours/day Type of Home: House Home Access: Stairs to enter [employer] of Steps: 1 Entrance Stairs-Rails: Right Home Layout: One level     Bathroom Shower/Tub: Occupational psychologist: Handicapped height Bathroom Accessibility: Yes   Home Equipment: Shower seat;BSC/3in1;Toilet riser;Grab bars - tub/shower;Cane - quad;Cane - single point   Additional Comments: Daughter was working as a Research scientist (physical sciences). Currently on FMLA and able to provide 24hr supervision/assist. Spouse unable to provide assist.      Prior Functioning/Environment              Frequency  Min 3X/week        Progress Toward Goals  OT Goals(current goals can now be found in the care plan section)  Progress towards OT goals: Progressing toward goals  Acute Rehab OT Goals Patient Stated Goal: To return home with [facility] therapies. OT Goal Formulation: With patient/family Time For Goal Achievement: 05/20/22 Potential to Achieve Goals: Good  ADL Goals Pt Will Perform Grooming: with  supervision;standing Pt Will Perform Upper Body Dressing: with supervision;sitting Pt Will Perform Lower Body Dressing: with supervision;sit to/from stand;with adaptive equipment Pt Will Transfer to Toilet: with supervision;ambulating;regular height toilet Pt Will Perform Toileting - Clothing Manipulation and hygiene: with supervision;sit to/from stand Pt Will Perform Tub/Shower Transfer: Shower transfer;with supervision;shower seat;ambulating Pt/caregiver will Perform Home Exercise Program: Increased ROM;Increased strength;Right Upper extremity;With Supervision;With written HEP provided Additional ADL Goal #1: Patient will independently recall and demo R shoulder precatuions in prep for ADLs.  Plan Discharge plan remains appropriate;Frequency remains appropriate    Co-evaluation                 AM-PAC OT "6 Clicks" Daily Activity     Outcome Measure   Help from another person eating meals?: A Little Help from another person taking care of personal grooming?: A Little Help from another person toileting, which includes using toliet, bedpan, or urinal?: A Little Help from another person bathing (including washing, rinsing, drying)?: A Little Help from another person to put on and taking off regular upper body clothing?: A Little Help from another person to put on and taking off regular lower body clothing?: A Little 6 Click Score: 18    End of Session Equipment Utilized During Treatment: Gait belt  OT Visit Diagnosis: Unsteadiness on feet (R26.81);Repeated falls (R29.6);Muscle weakness (generalized) (M62.81);Pain Pain - Right/Left: Right Pain - part of body: Shoulder   Activity Tolerance Patient tolerated treatment well;Patient limited by pain   Patient Left with call bell/phone within reach;in bed;with bed alarm set;with family/visitor present   Nurse Communication Mobility status        Time: 1100-1145 OT Time Calculation (min): 45 min  Charges: OT General  Charges $OT Visit: 1 Visit OT Evaluation $OT Eval Moderate Complexity: 1 Mod OT Treatments $Therapeutic Activity: 38-52 mins  Thierno Hun H. OTR/L Supplemental OT, Department of rehab services [phone]  Viviane Semidey R H. 05/06/2022, 11:53 AM

## 2022-05-06 NOTE — Evaluation (Signed)
Occupational Therapy Evaluation Patient Details Name: Sarah Riddle MRN: [id number] DOB: 12-30-1940 Today's Date: 05/06/2022   History of Present Illness 81 y.o. female presenting to ED 8/2 after ground level fall. Imaging (+) for R proximal humerus fx. s/p IM nailing. PMHx significant for OSA on CPAP, recurrent falls with injury (most recent 02/2022), CAD, PAF s/p ablation 2019 on Coumadin, HTN, neuropathy, melanoma and chronic pain syndrome.   Clinical Impression   PTA patient was living with her spouse and daughter in a 1-level private residence and was grossly independent with ADLs/IADLs without AD. Shared responsibility with daughter for IADLs including meal prep. Patient currently functioning below baseline requiring Min A grossly with UB/LB ADLs without AD. Patient also limited by deficits listed below including decreased dynamic balance deficits and would benefit from continued acute OT services in prep for safe d/c home with family. OT will continue to follow acutely with plan for afternoon session with focus on RUE therapeutic exercise with adherence to R shoulder precautions as patient likely to d/c later this date.        Recommendations for follow up therapy are one component of a multi-disciplinary discharge planning process, led by the attending physician.  Recommendations may be updated based on patient status, additional functional criteria and insurance authorization.   Follow Up Recommendations  Home health OT    Assistance Recommended at Discharge Frequent or constant Supervision/Assistance  Patient can return home with the following A little help with walking and/or transfers;A little help with bathing/dressing/bathroom;Assistance with cooking/housework;Assist for transportation;Help with stairs or ramp for entrance    Functional Status Assessment  Patient has had a recent decline in their functional status and demonstrates the ability to make significant improvements in  function in a reasonable and predictable amount of time.  Equipment Recommendations  None recommended by OT    Recommendations for Other Services       Precautions / Restrictions Precautions Precautions: Fall;Shoulder Type of Shoulder Precautions: PROM only at shoulder x1 week followed by light AROM for 3-4wk; no restrictions at elbow, wrist or digits; sling for comfort Shoulder Interventions: Shoulder sling/immobilizer;Off for dressing/bathing/exercises Precaution Booklet Issued: Yes (comment) Precaution Comments: Patient/daughter able to recall shoulder precatuions after initial education. Required Braces or Orthoses: Sling Restrictions Weight Bearing Restrictions: Yes RUE Weight Bearing: Non weight bearing      Mobility Bed Mobility Overal bed mobility: Needs Assistance Bed Mobility: Supine to Sit     Supine to sit: Supervision, HOB elevated     General bed mobility comments: No external assist required.    Transfers Overall transfer level: Needs assistance Equipment used: 1 person hand held assist Transfers: Sit to/from Stand Sit to Stand: Min guard           General transfer comment: Min guard for steadying from slightly elevated EOB.      Balance Overall balance assessment: Needs assistance Sitting-balance support: No upper extremity supported, Feet supported Sitting balance-Leahy Scale: Fair     Standing balance support: Single extremity supported, During functional activity, Reliant on assistive device for balance Standing balance-Leahy Scale: Poor                             ADL either performed or assessed with clinical judgement   ADL Overall ADL's : Needs assistance/impaired Eating/Feeding: Set up;Sitting   Grooming: Set up;Sitting   Upper Body Bathing: Minimal assistance;Sitting   Lower Body Bathing: Minimal assistance;Sit to/from stand   Upper  Body Dressing : Minimal assistance;Sitting   Lower Body Dressing: Minimal  assistance;Sit to/from stand Lower Body Dressing Details (indicate cue type and reason): Able to adjust footwear seated EOB in figure-4 position. Requires increased time and cues for technique. Toilet Transfer: Magazine features editor Details (indicate cue type and reason): Simulated with transfer to recliner with HHA+1. Mild LOB with turning. Min guard for steadying without AD. Toileting- Water quality scientist and Hygiene: Min guard;Sit to/from stand         General ADL Comments: Grossly Min A with UB/LB ADLs with cues for R shoulder precautions. Min guard grossly for functional transfers and mobility with HHA.     Vision Baseline Vision/History: 1 Wears glasses Ability to See in Adequate Light: 0 Adequate Patient Visual Report: No change from baseline Vision Assessment?: No apparent visual deficits     Perception     Praxis      Pertinent Vitals/Pain Pain Assessment Pain Assessment: 0-10 Pain Score: 6  Pain Location: R shoulder Pain Descriptors / Indicators: Sharp, Constant, Grimacing, Guarding Pain Intervention(s): Limited activity within patient's tolerance, Monitored during session, Repositioned, Ice applied, Premedicated before session     Hand Dominance Right   Extremity/Trunk Assessment Upper Extremity Assessment Upper Extremity Assessment: RUE deficits/detail;LUE deficits/detail RUE Deficits / Details: AROM WFL at elbow, wrist and digits. Did not assess at shoulder 2/2 precautions. RUE: Unable to fully assess due to immobilization;Shoulder pain at rest RUE Sensation: decreased light touch RUE Coordination: decreased fine motor;decreased gross motor LUE Deficits / Details: AROM WFL LUE Sensation: WNL LUE Coordination: WNL   Lower Extremity Assessment Lower Extremity Assessment: Defer to PT evaluation   Cervical / Trunk Assessment Cervical / Trunk Assessment: Normal   Communication Communication Communication: No difficulties   Cognition  Arousal/Alertness: Awake/alert Behavior During Therapy: WFL for tasks assessed/performed Overall Cognitive Status: Within Functional Limits for tasks assessed                                       General Comments  Daughter present at bedside throughout. Actively participates in session.    Exercises     Shoulder Instructions      Home Living Family/patient expects to be discharged to:: Private residence Living Arrangements: Spouse/significant other;Children (Spouse and daughter) Available Help at Discharge: Family;Available 24 hours/day Type of Home: House Home Access: Stairs to enter [employer] of Steps: 1 Entrance Stairs-Rails: Right Home Layout: One level     Bathroom Shower/Tub: Occupational psychologist: Handicapped height     Home Equipment: Shower seat;BSC/3in1;Toilet riser;Grab bars - tub/shower;Cane - quad;Cane - single point   Additional Comments: Daughter was working as a Research scientist (physical sciences). Currently on FMLA and able to provide 24hr supervision/assist. Spouse unable to provide assist.      Prior Functioning/Environment Prior Level of Function : Driving;Independent/Modified Independent             Mobility Comments: Independent with household/community mobility without AD. ADLs Comments: Ind with ADLs; shared responsibility with daughter for IADLs. Drives (education provided on dangers of driving with significant neuropathy).        OT Problem List: Decreased strength;Decreased range of motion;Impaired balance (sitting and/or standing);Decreased coordination;Decreased knowledge of use of DME or AE;Impaired sensation;Impaired UE functional use;Pain;Increased edema      OT Treatment/Interventions: Self-care/ADL training;Therapeutic exercise;Energy conservation;DME and/or AE instruction;Therapeutic activities;Patient/family education;Balance training    OT Goals(Current goals can be  found in the care plan section) Acute Rehab  OT Goals Patient Stated Goal: To return home with [facility] therapies. OT Goal Formulation: With patient/family Time For Goal Achievement: 05/20/22 Potential to Achieve Goals: Good ADL Goals Pt Will Perform Grooming: with supervision;standing Pt Will Perform Upper Body Dressing: with supervision;sitting Pt Will Perform Lower Body Dressing: with supervision;sit to/from stand;with adaptive equipment Pt Will Transfer to Toilet: with supervision;ambulating;regular height toilet Pt Will Perform Toileting - Clothing Manipulation and hygiene: with supervision;sit to/from stand Pt Will Perform Tub/Shower Transfer: Shower transfer;with supervision;shower seat;ambulating Pt/caregiver will Perform Home Exercise Program: Increased ROM;Increased strength;Right Upper extremity;With Supervision;With written HEP provided Additional ADL Goal #1: Patient will independently recall and demo R shoulder precatuions in prep for ADLs.  OT Frequency: Min 3X/week    Co-evaluation              AM-PAC OT "6 Clicks" Daily Activity     Outcome Measure Help from another person eating meals?: A Little Help from another person taking care of personal grooming?: A Little Help from another person toileting, which includes using toliet, bedpan, or urinal?: A Little Help from another person bathing (including washing, rinsing, drying)?: A Little Help from another person to put on and taking off regular upper body clothing?: A Little Help from another person to put on and taking off regular lower body clothing?: A Little 6 Click Score: 18   End of Session Equipment Utilized During Treatment: Gait belt Nurse Communication: Mobility status  Activity Tolerance: Patient tolerated treatment well;Patient limited by pain Patient left: in chair;with call bell/phone within reach  OT Visit Diagnosis: Unsteadiness on feet (R26.81);Repeated falls (R29.6);Muscle weakness (generalized) (M62.81);Pain Pain - Right/Left: Right Pain -  part of body: Shoulder                Time: 7341-9379 OT Time Calculation (min): 23 min Charges:  OT General Charges $OT Visit: 1 Visit OT Evaluation $OT Eval Moderate Complexity: 1 Mod OT Treatments $Therapeutic Activity: 8-22 mins  Micahel Omlor H. OTR/L Supplemental OT, Department of rehab services [phone]  Matthe Sloane R H. 05/06/2022, 8:58 AM

## 2022-05-06 NOTE — Evaluation (Signed)
Physical Therapy Evaluation Patient Details Name: Sarah Riddle MRN: [id number] DOB: 1941-05-30 Today's Date: 05/06/2022  History of Present Illness  81 y.o. female presenting to ED 8/2 after ground level fall. Imaging (+) for R proximal humerus fx. s/p IM nailing. PMHx significant for OSA on CPAP, recurrent falls with injury (most recent 02/2022), CAD, PAF s/p ablation 2019 on Coumadin, HTN, neuropathy, melanoma and chronic pain syndrome.  Clinical Impression  Pt presents with impairments in strength, ROM, and balance. Pt tolerates therapy well today, ambulating household distances with an AD. Pt unsteady during ambulation, losing her balance twice - she attributes this to neuropathy of her L foot. Continued therapy will assist the pt in building strength, maintaining ROM, and improving balance for progression of functional independence.        Recommendations for follow up therapy are one component of a multi-disciplinary discharge planning process, led by the attending physician.  Recommendations may be updated based on patient status, additional functional criteria and insurance authorization.  Follow Up Recommendations Home health PT      Assistance Recommended at Discharge Intermittent Supervision/Assistance  Patient can return home with the following  A little help with walking and/or transfers;A little help with bathing/dressing/bathroom;Assistance with cooking/housework;Assist for transportation;Help with stairs or ramp for entrance    Equipment Recommendations None recommended by PT  Recommendations for Other Services       Functional Status Assessment Patient has had a recent decline in their functional status and demonstrates the ability to make significant improvements in function in a reasonable and predictable amount of time.     Precautions / Restrictions Precautions Precautions: Fall;Shoulder Type of Shoulder Precautions: PROM only at shoulder x1 week followed by  light AROM for 3-4wk; no restrictions at elbow, wrist or digits; sling for comfort Shoulder Interventions: Shoulder sling/immobilizer;Off for dressing/bathing/exercises Precaution Booklet Issued: Yes (comment) Precaution Comments: Patient/daughter able to recall shoulder precatuions after initial education. Required Braces or Orthoses: Sling Restrictions Weight Bearing Restrictions: Yes RUE Weight Bearing: Non weight bearing      Mobility  Bed Mobility                    Transfers Overall transfer level: Needs assistance Equipment used: None Transfers: Sit to/from Stand Sit to Stand: Min guard           General transfer comment: Uses UE to push through arms of chair and power up to transfer    Ambulation/Gait Ambulation/Gait assistance: Min guard Gait Distance (Feet): 200 Feet Assistive device: Straight cane Gait Pattern/deviations: Decreased step length - right, Decreased stance time - left, Step-through pattern, Antalgic Gait velocity: Reduced Gait velocity interpretation: 1.31 - 2.62 ft/sec, indicative of limited community ambulator   General Gait Details: Pt gait affected by LLE neuropathy; LOB x3 during ambulation with PT steadying assist  Stairs Stairs: Yes Stairs assistance: Min guard Stair Management: One rail Right, Sideways Number of Stairs: 2 General stair comments: Sideways and pulling with LUE due to RUE weight bearing restrictions  Wheelchair Mobility    Modified Rankin (Stroke Patients Only)       Balance Overall balance assessment: Needs assistance Sitting-balance support: No upper extremity supported, Feet supported Sitting balance-Leahy Scale: Fair     Standing balance support: Single extremity supported, During functional activity, Reliant on assistive device for balance Standing balance-Leahy Scale: Poor  Pertinent Vitals/Pain Pain Assessment Pain Assessment: 0-10 Pain Score: 6   Pain Location: R shoulder Pain Descriptors / Indicators: Sharp, Constant, Grimacing, Guarding Pain Intervention(s): Monitored during session    Home Living Family/patient expects to be discharged to:: Private residence Living Arrangements: Spouse/significant other;Children (Spouse and daughter) Available Help at Discharge: Family;Available 24 hours/day Type of Home: House Home Access: Stairs to enter Entrance Stairs-Rails: Right Entrance Stairs-Number of Steps: 1   Home Layout: One level Home Equipment: Shower seat;BSC/3in1;Toilet riser;Grab bars - tub/shower;Cane - quad;Cane - single point Additional Comments: Daughter was working as a Research scientist (physical sciences). Currently on FMLA and able to provide 24hr supervision/assist. Spouse unable to provide assist.    Prior Function Prior Level of Function : Driving;Independent/Modified Independent             Mobility Comments: Independent with household/community mobility without AD. ADLs Comments: Ind with ADLs; shared responsibility with daughter for IADLs. Drives (education provided on dangers of driving with significant neuropathy).     Hand Dominance   Dominant Hand: Right    Extremity/Trunk Assessment   Upper Extremity Assessment Upper Extremity Assessment: RUE deficits/detail RUE Deficits / Details: AROM WFL at elbow, wrist and digits. Did not assess at shoulder 2/2 precautions. RUE: Unable to fully assess due to immobilization;Shoulder pain at rest RUE Sensation: decreased light touch RUE Coordination: decreased fine motor;decreased gross motor LUE Deficits / Details: AROM WFL LUE Sensation: WNL LUE Coordination: WNL    Lower Extremity Assessment Lower Extremity Assessment: Generalized weakness    Cervical / Trunk Assessment Cervical / Trunk Assessment: Normal  Communication   Communication: No difficulties  Cognition Arousal/Alertness: Awake/alert Behavior During Therapy: WFL for tasks assessed/performed Overall  Cognitive Status: Within Functional Limits for tasks assessed                                          General Comments General comments (skin integrity, edema, etc.): Daughter present at bedside throughout education session.    Exercises     Assessment/Plan    PT Assessment Patient needs continued PT services  PT Problem List Decreased strength;Decreased activity tolerance;Decreased balance;Decreased mobility;Decreased range of motion       PT Treatment Interventions DME instruction;Gait training;Stair training;Functional mobility training;Therapeutic activities;Therapeutic exercise;Balance training;Patient/family education    PT Goals (Current goals can be found in the Care Plan section)  Acute Rehab PT Goals Patient Stated Goal: Return home PT Goal Formulation: With patient Time For Goal Achievement: 05/20/22 Potential to Achieve Goals: Good    Frequency Min 3X/week     Co-evaluation               AM-PAC PT "6 Clicks" Mobility  Outcome Measure Help needed turning from your back to your side while in a flat bed without using bedrails?: A Little Help needed moving from lying on your back to sitting on the side of a flat bed without using bedrails?: A Little Help needed moving to and from a bed to a chair (including a wheelchair)?: A Little Help needed standing up from a chair using your arms (e.g., wheelchair or bedside chair)?: A Little Help needed to walk in hospital room?: A Little Help needed climbing 3-5 steps with a railing? : A Little 6 Click Score: 18    End of Session   Activity Tolerance: Patient tolerated treatment well Patient left: in chair;with call bell/phone within reach;with family/visitor  present Nurse Communication: Mobility status PT Visit Diagnosis: Unsteadiness on feet (R26.81);History of falling (Z91.81);Muscle weakness (generalized) (M62.81);Other abnormalities of gait and mobility (R26.89)    Time: 7209-4709 PT Time  Calculation (min) (ACUTE ONLY): 21 min   Charges:   PT Evaluation $PT Eval Low Complexity: 1 Low          Hall Busing, SPT Acute Rehabilitation Office #: [phone]   Hall Busing 05/06/2022, 12:04 PM

## 2022-05-06 NOTE — Progress Notes (Signed)
Pt on RA. Assisted PT with setting up CPAP with her own mask. PT wears 2L Sarah Riddle at night at home, bled into CPAP. PT to self adminster when ready.

## 2022-05-06 NOTE — Plan of Care (Signed)

## 2022-05-06 NOTE — Progress Notes (Signed)
Orthopaedic Trauma Progress Note  S: Patient feels okay nerve block wore off earlier this morning.  She has taken a couple pain pills that has helped but they only last for a time.  She is a little concerned about her blood pressure she feels a little bit low.  She has not gotten up out of bed yet.  She is awaiting therapy.  Her daughter is at bedside.  Otherwise comfortable denies any chest pain some shortness of breath when she lays down flat.  O:  Vitals:   05/05/22 2358 05/06/22 0444  BP: (!) 100/52 (!) 106/43  Pulse: 67 67  Resp: 16 17  Temp:    SpO2: 98% 98%    No acute distress, awake alert and oriented x3.  Right upper extremity: Dressings are in place and they are clean dry and intact.  She has motor and sensory function to median, radial and ulnar nerve distribution.  Warm well-perfused hand.  Imaging: Stable postoperative imaging  Labs: No results found for this or any previous visit (from the past 24 hour(s)).  Assessment: 81 year old female status post intramedullary nailing right humeral shaft fracture.  Injuries: As above  Weightbearing: Nonweightbearing but allowed for gentle passive and active range of motion of the shoulder and elbow  Insicional and dressing care: Continue dressings until tomorrow and then can be changed  Orthopedic device(s): Sling for comfort  CV/Blood loss: We will recheck CBC this morning.  Blood pressure and heart rate within normal range.  Will give her some fluid through her IV  Pain management: Hydrocodone p.o. as needed with IV morphine as needed  VTE prophylaxis: We will restart her Eliquis if her hemoglobin is stable after CBC drawn this morning  ID: Postoperative Ancef scheduled  Foley/Lines: No Foley we will give her fluids this morning  Dispo: PT OT evaluation today potential discharge home later today if her pain is controlled and she is mobilizing well.  Follow - up plan: 2 weeks for x-rays and wound check  Shona Needles,  MD [facility] [phone] (office) [email or web address]

## 2022-05-07 ENCOUNTER — Other Ambulatory Visit (HOSPITAL_COMMUNITY): Payer: Self-pay

## 2022-05-07 ENCOUNTER — Encounter (HOSPITAL_COMMUNITY): Payer: Self-pay | Admitting: Student

## 2022-05-07 DIAGNOSIS — I4891 Unspecified atrial fibrillation: Secondary | ICD-10-CM | POA: Diagnosis not present

## 2022-05-07 DIAGNOSIS — S42201A Unspecified fracture of upper end of right humerus, initial encounter for closed fracture: Secondary | ICD-10-CM | POA: Diagnosis not present

## 2022-05-07 DIAGNOSIS — I129 Hypertensive chronic kidney disease with stage 1 through stage 4 chronic kidney disease, or unspecified chronic kidney disease: Secondary | ICD-10-CM | POA: Diagnosis not present

## 2022-05-07 DIAGNOSIS — Z96642 Presence of left artificial hip joint: Secondary | ICD-10-CM | POA: Diagnosis not present

## 2022-05-07 DIAGNOSIS — Z86718 Personal history of other venous thrombosis and embolism: Secondary | ICD-10-CM | POA: Diagnosis not present

## 2022-05-07 DIAGNOSIS — Z7901 Long term (current) use of anticoagulants: Secondary | ICD-10-CM | POA: Diagnosis not present

## 2022-05-07 DIAGNOSIS — Z85828 Personal history of other malignant neoplasm of skin: Secondary | ICD-10-CM | POA: Diagnosis not present

## 2022-05-07 DIAGNOSIS — N189 Chronic kidney disease, unspecified: Secondary | ICD-10-CM | POA: Diagnosis not present

## 2022-05-07 DIAGNOSIS — S42351A Displaced comminuted fracture of shaft of humerus, right arm, initial encounter for closed fracture: Secondary | ICD-10-CM | POA: Diagnosis not present

## 2022-05-07 LAB — CBC
HCT: 23.5 % — ABNORMAL LOW (ref 36.0–46.0)
Hemoglobin: 7.5 g/dL — ABNORMAL LOW (ref 12.0–15.0)
MCH: 30.4 pg (ref 26.0–34.0)
MCHC: 31.9 g/dL (ref 30.0–36.0)
MCV: 95.1 fL (ref 80.0–100.0)
Platelets: 197 10*3/uL (ref 150–400)
RBC: 2.47 MIL/uL — ABNORMAL LOW (ref 3.87–5.11)
RDW: 13.1 % (ref 11.5–15.5)
WBC: 6.6 10*3/uL (ref 4.0–10.5)
nRBC: 0 % (ref 0.0–0.2)

## 2022-05-07 MED ORDER — HYDROCODONE-ACETAMINOPHEN 5-325 MG PO TABS
1.0000 | ORAL_TABLET | ORAL | 0 refills | Status: AC | PRN
Start: 2022-05-07 — End: 2022-06-26

## 2022-05-07 MED ORDER — HYDROCODONE-ACETAMINOPHEN 5-325 MG PO TABS
ORAL_TABLET | ORAL | 0 refills | Status: DC
Start: 2022-05-07 — End: 2022-05-07
  Filled 2022-05-07: qty 30, 4d supply, fill #0

## 2022-05-07 NOTE — Care Management Obs Status (Signed)
[place] NOTIFICATION   Patient Details  Name: Sarah Riddle MRN: [id number] Date of Birth: 1941/04/21   Medicare Observation Status Notification Given:  Yes    Bartholomew Crews, RN 05/07/2022, 10:04 AM

## 2022-05-07 NOTE — TOC Transition Note (Addendum)
Transition of Care [facility]) - CM/SW Discharge Note   Patient Details  Name: Sarah Riddle MRN: [id number] Date of Birth: 11/17/1940  [facility] CM/SW Contact:  Bartholomew Crews, RN Phone Number: [phone] 05/07/2022, 10:07 AM   Clinical Narrative:     Spoke with patient on hospital room phone to discuss post acute transition. PTA home with spouse who uses a walker at baseline. Daughter recently moved in to assist as needed, but works night shift. Discussed recommendations for [facility] - patient is agreeable stating that she is currently active with [facility]. Verified active status with [facility] liaison - patient is active for PT. Current recommendations are for PT, OT. Patient will need HH Face to Face orders for PT, OT. Discussed caregiver resources for private pay - patient stated that she cannot afford out of pocket cost. Patient stated that her daughter is present with her now and will provide transportation home. Patient stated that she is anticipating discharge today.    Final next level of care: Home w Home Health Services Barriers to Discharge: No Barriers Identified   Patient Goals and CMS Choice Patient states their goals for this hospitalization and ongoing recovery are:: home with spouse and daughter CMS Medicare.gov Compare Post Acute Care list provided to:: Patient Choice offered to / list presented to : Patient  Discharge Placement                       Discharge Plan and Services                DME Arranged: N/A DME Agency: NA       HH Arranged: PT HH Agency: [place] Date [place]: 05/07/22 Time [place]: 1007 Representative spoke with at [place]: Otila Kluver  Social Determinants of Health (SDOH) Interventions     Readmission Risk Interventions     No data to display

## 2022-05-07 NOTE — Progress Notes (Signed)
Patient ID: Sarah Riddle, female   DOB: November 16, 1940, 81 y.o.   MRN: [id number]   LOS: 0 days   Subjective: Doing well, pain controlled, ready to go home   Objective: Vital signs in last 24 hours: Temp:  [98.1 F (36.7 C)-98.8 F (37.1 C)] 98.1 F (36.7 C) (08/04 0900) Pulse Rate:  [62-73] 73 (08/04 0900) Resp:  [17-18] 17 (08/04 0900) BP: (101-120)/(39-46) 117/45 (08/04 0900) SpO2:  [92 %-100 %] 100 % (08/03 2304) Last BM Date : 05/05/22   Laboratory  CBC Recent Labs    05/06/22 1014 05/07/22 0218  WBC 6.4 6.6  HGB 7.2* 7.5*  HCT 23.4* 23.5*  PLT 216 197     Physical Exam General appearance: alert and no distress RUE -- 2+ radial pulse, NVI, in sling   Assessment/Plan: S/p ORIF distal humerus -- D/c home. F/u with Dr. Doreatha Martin as directed.    Lisette Abu, PA-C Orthopedic Surgery [phone] 05/07/2022

## 2022-05-07 NOTE — Plan of Care (Signed)

## 2022-05-07 NOTE — Plan of Care (Signed)

## 2022-05-08 ENCOUNTER — Other Ambulatory Visit (HOSPITAL_COMMUNITY): Payer: Self-pay

## 2022-05-11 NOTE — Discharge Summary (Signed)
Orthopaedic Trauma Service (OTS) Discharge Summary   Patient ID: Sarah Riddle MRN: [id number] DOB/AGE: 1940-11-02 81 y.o.  Admit date: 05/05/2022 Discharge date: 05/07/2022  Admission Diagnoses: Right proximal humerus fracture with humeral shaft extension  Discharge Diagnoses:  Principal Problem:   Closed fracture of right proximal humerus   Past Medical History:  Diagnosis Date   A-fib [facility])    Age related osteoporosis    Cancer ([place])    Melanoma x 2 -- Lt Leg -1976 Rt Arm - 2014   Chronic bilateral low back pain    Chronic kidney disease    Complication of anesthesia    DVT (deep venous thrombosis) ([place]) 1960   right leg  was on BC pills   Dysrhythmia    Family history of breast cancer    Family history of colon cancer    Family history of melanoma    Family history of prostate cancer    Fibrocystic breast disease    GERD (gastroesophageal reflux disease)    Headache    migraine   Heart murmur    not heard now.   History of kidney stones    Hyperlipidemia    Hypertension    IBS (irritable bowel syndrome)    Lumbago    Mitral valve prolapse    Neuropathy    Obstructive sleep apnea    Osteoarthritis of shoulder    PONV (postoperative nausea and vomiting)      Procedures Performed:  CPT 23615-Open repair of right proximal humerus fracture CPT 24516-Intramedullary nailing of right humerus fracture   Discharged Condition: good  Hospital Course: Patient presented to [facility] on 04/[facility]/2022 after sustaining a fall from standing position.  Landed on right on.  Had immediate right arm pain.  Imaging in the emergency department revealed right proximal humerus fracture with extension into the humeral shaft.  Orthopedics was consulted for evaluation and management.  Patient was discharged home from the emergency department with instructions to follow-up with OTS outpatient to arrange surgical fixation.  Patient presented to [facility] on 05/05/2022 for scheduled  surgical fixation of her right humerus.  She was taken to the operating room by Dr. Doreatha Martin for the above procedure.  She tolerated this well without complications.  Was admitted postoperatively for observation and pain control.  Began working with physical and occupational therapy starting on postoperative day #1.  Home dose Eliquis was held and not restarted until postoperative day #2 due to [place].  Remainder of patient's hospitalization was dedicated to achieving adequate pain control and increasing mobility is with therapy.  On 05/07/2022, the patient was tolerating diet, working well with therapies, pain well controlled, vital signs stable, dressings clean, dry, intact and felt stable for discharge to home. Patient will follow up as below and knows to call with questions or concerns.     Consults: orthopedic surgery  Significant Diagnostic Studies:  Results for orders placed or performed during the hospital encounter of 05/05/22 (from the past 168 hour(s))  Protime-INR   Collection Time: 05/05/22  6:33 AM  Result Value Ref Range   Prothrombin Time 15.4 (H) 11.4 - 15.2 seconds   INR 1.2 0.8 - 1.2  CBC   Collection Time: 05/06/22 10:14 AM  Result Value Ref Range   WBC 6.4 4.0 - 10.5 K/uL   RBC 2.42 (L) 3.87 - 5.11 MIL/uL   Hemoglobin 7.2 (L) 12.0 - 15.0 g/dL   HCT 23.4 (L) 36.0 - 46.0 %   MCV 96.7 80.0 -  100.0 fL   MCH 29.8 26.0 - 34.0 pg   MCHC 30.8 30.0 - 36.0 g/dL   RDW 13.2 11.5 - 15.5 %   Platelets 216 150 - 400 K/uL   nRBC 0.0 0.0 - 0.2 %  CBC   Collection Time: 05/07/22  2:18 AM  Result Value Ref Range   WBC 6.6 4.0 - 10.5 K/uL   RBC 2.47 (L) 3.87 - 5.11 MIL/uL   Hemoglobin 7.5 (L) 12.0 - 15.0 g/dL   HCT 23.5 (L) 36.0 - 46.0 %   MCV 95.1 80.0 - 100.0 fL   MCH 30.4 26.0 - 34.0 pg   MCHC 31.9 30.0 - 36.0 g/dL   RDW 13.1 11.5 - 15.5 %   Platelets 197 150 - 400 K/uL   nRBC 0.0 0.0 - 0.2 %     Treatments: IV hydration, antibiotics: Ancef, anticoagulation: Eliquis, therapies:  PT and OT, and surgery: as above  Discharge Exam: General appearance: alert and no distress RUE: 2+ radial pulse, NVI, in sling    Disposition: Discharge disposition: 01-Home or Self Care        Allergies as of 05/07/2022       Reactions   Iodinated Contrast Media Shortness Of Breath   Can be premedicated   Ibuprofen Other (See Comments)   "stomach problems"   Lubiprostone Nausea And Vomiting      Sulfa Antibiotics Other (See Comments)   "Stomach problems"   Zolpidem Other (See Comments)   "hallucinations" Other reaction(s): Confusion   Boniva [ibandronic Acid] Diarrhea, Nausea And Vomiting   Celecoxib Other (See Comments)   GI upset   Doxycycline Diarrhea, Nausea And Vomiting   Duloxetine Other (See Comments)   Over sedation with Zelnorm (IBS med)   Tetracyclines & Related Nausea Only, Nausea And Vomiting        Medication List     STOP taking these medications    acetaminophen 500 MG tablet Commonly known as: TYLENOL   lidocaine 5 % Commonly known as: LIDODERM       TAKE these medications    alendronate 70 MG tablet Commonly known as: FOSAMAX Take 70 mg by mouth every Tuesday. Take with a full glass of water on an empty stomach.   allopurinol 100 MG tablet Commonly known as: ZYLOPRIM Take 100 mg by mouth daily.   apixaban 5 MG Tabs tablet Commonly known as: ELIQUIS Take 5 mg by mouth 2 (two) times daily.   CALTRATE 600+D PO Take 1 tablet by mouth daily.   Cranberry 125 MG Tabs Take 125 mg by mouth daily.   cyclobenzaprine 5 MG tablet Commonly known as: FLEXERIL Take 1 tablet (5 mg total) by mouth 3 (three) times daily as needed.   ezetimibe 10 MG tablet Commonly known as: ZETIA Take 10 mg by mouth daily.   fluticasone 50 MCG/ACT nasal spray Commonly known as: FLONASE Place 2 sprays into both nostrils daily.   gabapentin 800 MG tablet Commonly known as: NEURONTIN Take 800 mg by mouth in the morning, at noon, in the evening, and at  bedtime.   hydrALAZINE 25 MG tablet Commonly known as: APRESOLINE Take 25 mg by mouth in the morning and at bedtime.   HYDROcodone-acetaminophen 5-325 MG tablet Commonly known as: NORCO/VICODIN Take 1-2 tablets by mouth every 4 (four) hours as needed for moderate pain (pain score 4-6).   isosorbide mononitrate 60 MG 24 hr tablet Commonly known as: IMDUR Take 60 mg by mouth daily.   levocetirizine 5 MG  tablet Commonly known as: XYZAL Take 5 mg by mouth every evening.   metoprolol succinate 25 MG 24 hr tablet Commonly known as: TOPROL-XL Take 25 mg by mouth daily.   nitroGLYCERIN 0.4 MG SL tablet Commonly known as: NITROSTAT Place 0.4 mg under the tongue every 5 (five) minutes as needed for chest pain.   ondansetron 4 MG disintegrating tablet Commonly known as: ZOFRAN-ODT Take 1 tablet (4 mg total) by mouth every 8 (eight) hours as needed for nausea or vomiting.   pantoprazole 40 MG tablet Commonly known as: PROTONIX Take 40 mg by mouth daily.   rosuvastatin 40 MG tablet Commonly known as: CRESTOR Take 40 mg by mouth at bedtime.   sertraline 25 MG tablet Commonly known as: ZOLOFT Take 25 mg by mouth 3 (three) times daily.   tetrahydrozoline-zinc 0.05-0.25 % ophthalmic solution Commonly known as: VISINE-AC Place 2 drops into both eyes 3 (three) times daily as needed (dry eyes).        Follow-up Information     [facility] [place] Follow up.   Specialty: [place] Why: someone from the office will call to schedule home health visits Contact information: [address] [address] [phone]                 Discharge Instructions and Plan: Patient will be discharged to home. Will be continued on home dose Eliquis for DVT prophylaxis. Patient has been provided with all the necessary DME for discharge. Patient will follow up with Dr. Doreatha Martin in 2 weeks for repeat x-rays and suture removal.   Signed:  Gwinda Passe,  PA-C ?[phone]? (phone) 05/11/2022, 8:35 AM  [facility] [place] [id number] [phone] Domingo Sep (F)

## 2022-05-18 DIAGNOSIS — S42301D Unspecified fracture of shaft of humerus, right arm, subsequent encounter for fracture with routine healing: Secondary | ICD-10-CM | POA: Diagnosis not present

## 2022-05-18 DIAGNOSIS — S42201D Unspecified fracture of upper end of right humerus, subsequent encounter for fracture with routine healing: Secondary | ICD-10-CM | POA: Diagnosis not present

## 2022-05-26 DIAGNOSIS — D509 Iron deficiency anemia, unspecified: Secondary | ICD-10-CM | POA: Diagnosis not present

## 2022-05-26 DIAGNOSIS — R609 Edema, unspecified: Secondary | ICD-10-CM | POA: Diagnosis not present

## 2022-05-26 DIAGNOSIS — M25511 Pain in right shoulder: Secondary | ICD-10-CM | POA: Diagnosis not present

## 2022-05-26 DIAGNOSIS — I2510 Atherosclerotic heart disease of native coronary artery without angina pectoris: Secondary | ICD-10-CM | POA: Diagnosis not present

## 2022-05-26 DIAGNOSIS — E782 Mixed hyperlipidemia: Secondary | ICD-10-CM | POA: Diagnosis not present

## 2022-06-03 DIAGNOSIS — I10 Essential (primary) hypertension: Secondary | ICD-10-CM | POA: Diagnosis not present

## 2022-06-03 DIAGNOSIS — I2510 Atherosclerotic heart disease of native coronary artery without angina pectoris: Secondary | ICD-10-CM | POA: Diagnosis not present

## 2022-06-03 DIAGNOSIS — I4891 Unspecified atrial fibrillation: Secondary | ICD-10-CM | POA: Diagnosis not present

## 2022-06-15 DIAGNOSIS — S42301D Unspecified fracture of shaft of humerus, right arm, subsequent encounter for fracture with routine healing: Secondary | ICD-10-CM | POA: Diagnosis not present

## 2022-06-15 DIAGNOSIS — S42201D Unspecified fracture of upper end of right humerus, subsequent encounter for fracture with routine healing: Secondary | ICD-10-CM | POA: Diagnosis not present

## 2022-06-21 DIAGNOSIS — E782 Mixed hyperlipidemia: Secondary | ICD-10-CM | POA: Diagnosis not present

## 2022-06-21 DIAGNOSIS — I10 Essential (primary) hypertension: Secondary | ICD-10-CM | POA: Diagnosis not present

## 2022-06-21 DIAGNOSIS — I340 Nonrheumatic mitral (valve) insufficiency: Secondary | ICD-10-CM | POA: Diagnosis not present

## 2022-06-21 DIAGNOSIS — I4891 Unspecified atrial fibrillation: Secondary | ICD-10-CM | POA: Diagnosis not present

## 2022-06-21 DIAGNOSIS — E669 Obesity, unspecified: Secondary | ICD-10-CM | POA: Diagnosis not present

## 2022-06-21 DIAGNOSIS — I2510 Atherosclerotic heart disease of native coronary artery without angina pectoris: Secondary | ICD-10-CM | POA: Diagnosis not present

## 2022-06-21 DIAGNOSIS — R7303 Prediabetes: Secondary | ICD-10-CM | POA: Diagnosis not present

## 2022-06-25 DIAGNOSIS — E782 Mixed hyperlipidemia: Secondary | ICD-10-CM | POA: Diagnosis not present

## 2022-06-25 DIAGNOSIS — Z0001 Encounter for general adult medical examination with abnormal findings: Secondary | ICD-10-CM | POA: Diagnosis not present

## 2022-06-25 DIAGNOSIS — R7303 Prediabetes: Secondary | ICD-10-CM | POA: Diagnosis not present

## 2022-06-25 DIAGNOSIS — M810 Age-related osteoporosis without current pathological fracture: Secondary | ICD-10-CM | POA: Diagnosis not present

## 2022-06-25 DIAGNOSIS — R5383 Other fatigue: Secondary | ICD-10-CM | POA: Diagnosis not present

## 2022-06-25 DIAGNOSIS — I10 Essential (primary) hypertension: Secondary | ICD-10-CM | POA: Diagnosis not present

## 2022-06-25 DIAGNOSIS — M25511 Pain in right shoulder: Secondary | ICD-10-CM | POA: Diagnosis not present

## 2022-06-25 DIAGNOSIS — R634 Abnormal weight loss: Secondary | ICD-10-CM | POA: Diagnosis not present

## 2022-06-29 DIAGNOSIS — Z86010 Personal history of colonic polyps: Secondary | ICD-10-CM | POA: Diagnosis not present

## 2022-06-29 DIAGNOSIS — Z205 Contact with and (suspected) exposure to viral hepatitis: Secondary | ICD-10-CM | POA: Diagnosis not present

## 2022-06-29 DIAGNOSIS — K760 Fatty (change of) liver, not elsewhere classified: Secondary | ICD-10-CM | POA: Diagnosis not present

## 2022-06-29 DIAGNOSIS — K5909 Other constipation: Secondary | ICD-10-CM | POA: Diagnosis not present

## 2022-06-29 DIAGNOSIS — Z800 Family history of malignant neoplasm of digestive organs: Secondary | ICD-10-CM | POA: Diagnosis not present

## 2022-06-29 DIAGNOSIS — D649 Anemia, unspecified: Secondary | ICD-10-CM | POA: Diagnosis not present

## 2022-06-30 ENCOUNTER — Other Ambulatory Visit: Payer: Self-pay

## 2022-06-30 ENCOUNTER — Emergency Department: Payer: Medicare PPO

## 2022-06-30 ENCOUNTER — Emergency Department
Admission: EM | Admit: 2022-06-30 | Discharge: 2022-06-30 | Disposition: A | Discharge status: Home / Self Care | Payer: Medicare PPO | Attending: Emergency Medicine | Admitting: Emergency Medicine

## 2022-06-30 ENCOUNTER — Encounter: Payer: Self-pay | Admitting: *Deleted

## 2022-06-30 DIAGNOSIS — R0789 Other chest pain: Secondary | ICD-10-CM | POA: Insufficient documentation

## 2022-06-30 DIAGNOSIS — W19XXXA Unspecified fall, initial encounter: Secondary | ICD-10-CM

## 2022-06-30 DIAGNOSIS — W010XXA Fall on same level from slipping, tripping and stumbling without subsequent striking against object, initial encounter: Secondary | ICD-10-CM | POA: Insufficient documentation

## 2022-06-30 DIAGNOSIS — M25511 Pain in right shoulder: Secondary | ICD-10-CM | POA: Insufficient documentation

## 2022-06-30 DIAGNOSIS — I10 Essential (primary) hypertension: Secondary | ICD-10-CM | POA: Diagnosis not present

## 2022-06-30 DIAGNOSIS — M25519 Pain in unspecified shoulder: Secondary | ICD-10-CM | POA: Diagnosis not present

## 2022-06-30 DIAGNOSIS — R079 Chest pain, unspecified: Secondary | ICD-10-CM | POA: Diagnosis not present

## 2022-06-30 DIAGNOSIS — R0689 Other abnormalities of breathing: Secondary | ICD-10-CM | POA: Diagnosis not present

## 2022-06-30 LAB — CBC WITH DIFFERENTIAL/PLATELET
Abs Immature Granulocytes: 0.02 10*3/uL (ref 0.00–0.07)
Basophils Absolute: 0 10*3/uL (ref 0.0–0.1)
Basophils Relative: 1 %
Eosinophils Absolute: 0.1 10*3/uL (ref 0.0–0.5)
Eosinophils Relative: 2 %
HCT: 34.9 % — ABNORMAL LOW (ref 36.0–46.0)
Hemoglobin: 10.7 g/dL — ABNORMAL LOW (ref 12.0–15.0)
Immature Granulocytes: 0 %
Lymphocytes Relative: 24 %
Lymphs Abs: 1.5 10*3/uL (ref 0.7–4.0)
MCH: 28.6 pg (ref 26.0–34.0)
MCHC: 30.7 g/dL (ref 30.0–36.0)
MCV: 93.3 fL (ref 80.0–100.0)
Monocytes Absolute: 0.4 10*3/uL (ref 0.1–1.0)
Monocytes Relative: 6 %
Neutro Abs: 4.3 10*3/uL (ref 1.7–7.7)
Neutrophils Relative %: 67 %
Platelets: 193 10*3/uL (ref 150–400)
RBC: 3.74 MIL/uL — ABNORMAL LOW (ref 3.87–5.11)
RDW: 13 % (ref 11.5–15.5)
WBC: 6.3 10*3/uL (ref 4.0–10.5)
nRBC: 0 % (ref 0.0–0.2)

## 2022-06-30 LAB — BASIC METABOLIC PANEL
Anion gap: 6 (ref 5–15)
BUN: 12 mg/dL (ref 8–23)
CO2: 25 mmol/L (ref 22–32)
Calcium: 8.5 mg/dL — ABNORMAL LOW (ref 8.9–10.3)
Chloride: 109 mmol/L (ref 98–111)
Creatinine, Ser: 0.76 mg/dL (ref 0.44–1.00)
GFR, Estimated: >60 mL/min (ref >60)
Glucose, Bld: 105 mg/dL — ABNORMAL HIGH (ref 70–99)
Potassium: 3.6 mmol/L (ref 3.5–5.1)
Sodium: 140 mmol/L (ref 135–145)

## 2022-06-30 LAB — TROPONIN I (HIGH SENSITIVITY): Troponin I (High Sensitivity): 13 ng/L (ref <18)

## 2022-06-30 MED ORDER — HYDROCODONE-ACETAMINOPHEN 5-325 MG PO TABS
1.0000 | ORAL_TABLET | Freq: Once | ORAL | Status: AC
  Administered 2022-06-30: 1 via ORAL
  Filled 2022-06-30: qty 1

## 2022-06-30 MED ORDER — ONDANSETRON HCL 4 MG/2ML IJ SOLN
4.0000 mg | Freq: Once | INTRAMUSCULAR | Status: AC
  Administered 2022-06-30: 4 mg via INTRAVENOUS
  Filled 2022-06-30: qty 2

## 2022-06-30 MED ORDER — HYDROCODONE-ACETAMINOPHEN 5-325 MG PO TABS: 1.0000 | ORAL_TABLET | Freq: Four times a day (QID) | ORAL | 0 refills | Status: DC | PRN

## 2022-06-30 MED ORDER — MORPHINE SULFATE (PF) 2 MG/ML IV SOLN
2.0000 mg | Freq: Once | INTRAVENOUS | Status: AC
  Administered 2022-06-30: 2 mg via INTRAVENOUS
  Filled 2022-06-30: qty 1

## 2022-06-30 NOTE — ED Triage Notes (Signed)
Pt brought in via ems from home.  Pt fell 3 days ago tripping over a cord onto right arm.  Pt had surgery on same arm 05/2022.  T report pain worse tonight   no chest pain or sob.  Pt alert, speech clear.

## 2022-06-30 NOTE — ED Provider Notes (Signed)
[facility] Provider Note    Event Date/Time   First MD Initiated Contact with Patient 06/30/22 2210     (approximate)   History   Fall   HPI  Sarah Riddle is a 81 y.o. female who reports she tripped over a computer wire and fell on her right shoulder on Monday.  She reports she did not really have any pain yesterday or on Monday.  Today however she developed some pain in the shoulder and the right upper chest.  This is gotten worse during the course of the day in the evening.  It is now worse with deep breathing is worse to touch is fairly severe.  She is not short of breath.  She has no fever or cough.      Physical Exam   Triage Vital Signs: ED Triage Vitals  Enc Vitals Group     BP 06/30/22 2154 (!) 187/81     Pulse Rate 06/30/22 2154 83     Resp 06/30/22 2154 18     Temp 06/30/22 2154 98 F (36.7 C)     Temp Source 06/30/22 2154 Oral     SpO2 06/30/22 2154 95 %     Weight 06/30/22 2155 208 lb (94.3 kg)     Height 06/30/22 2155 '5\' 11"'$  (1.803 m)     Head Circumference --      Peak Flow --      Pain Score 06/30/22 2154 4     Pain Loc --      Pain Edu? --      Excl. in [place]? --     Most recent vital signs: Vitals:   06/30/22 2154 06/30/22 2314  BP: (!) 187/81 (!) 175/79  Pulse: 83 74  Resp: 18 16  Temp: 98 F (36.7 C)   SpO2: 95% 93%    General: Awake, no distress.  CV:  Good peripheral perfusion.  Heart regular rate and rhythm no audible murmurs Resp:  Normal effort.  Lungs are clear Chest is tender in the right upper anterior chest shoulder itself really is not tender. Extremities: Patient has a normal distal pulse good range of motion of the wrist and elbow she is holding the shoulder fairly still so it does not hurt when she moves it. Abd:  No distention.  Soft and nontender Legs: There is bilateral edema  ED Results / Procedures / Treatments   Labs (all labs ordered are listed, but only abnormal results are  displayed) Labs Reviewed  BASIC METABOLIC PANEL - Abnormal; Notable for the following components:      Result Value   Glucose, Bld 105 (*)    Calcium 8.5 (*)    All other components within normal limits  CBC WITH DIFFERENTIAL/PLATELET - Abnormal; Notable for the following components:   RBC 3.74 (*)    Hemoglobin 10.7 (*)    HCT 34.9 (*)    All other components within normal limits  TROPONIN I (HIGH SENSITIVITY)     EKG  EKG read and interpreted by me shows normal sinus rhythm rate of 75 normal axis no acute ST-T wave changes are seen   RADIOLOGY X-ray of the shoulder and chest read by radiology reviewed and interpreted by me do not show any acute changes or pneumothorax or anything else.  PROCEDURES:  Critical Care performed:   Procedures   MEDICATIONS ORDERED IN ED: Medications  HYDROcodone-acetaminophen (NORCO/VICODIN) 5-325 MG per tablet 1 tablet (has no administration in time range)  morphine (PF) 2 MG/ML injection 2 mg (2 mg Intravenous Given 06/30/22 2314)  ondansetron (ZOFRAN) injection 4 mg (4 mg Intravenous Given 06/30/22 2314)     IMPRESSION / MDM / ASSESSMENT AND PLAN / ED COURSE  I reviewed the triage vital signs and the nursing notes. Patient with pain in the right upper chest wall worse with palpation or movement.  She is not short of breath although pain is worse when she breathes or moves nothing is present on x-ray.  There is no reason to suspect any pneumonia and there is none on the x-ray anyway.  Do not see any rib fractures.  I am not sure why she is having this pain possibly she pulled a muscle.  I will let her go with some pain medication.  She has an appointment tomorrow with the surgeon and I will let him see her again, she is much better.  If she is worse she needs to come back again.  It is possible although unlikely she has developed the or is developing of late pneumothorax.  Another possibility could be the development of shingles.  Possibly there  is a pneumonia that has not shown up yet. The pain is exactly reproduced by palpation.  Patient's presentation is most consistent with acute complicated illness / injury requiring diagnostic workup.  The patient is on the cardiac monitor to evaluate for evidence of arrhythmia and/or significant heart rate changes none have been seen   FINAL CLINICAL IMPRESSION(S) / ED DIAGNOSES   Final diagnoses:  Fall, initial encounter  Chest wall pain     Rx / DC Orders   ED Discharge Orders     None        Note:  This document was prepared using Dragon voice recognition software and may include unintentional dictation errors.   Nena Polio, MD 06/30/22 2320

## 2022-06-30 NOTE — ED Notes (Signed)
Pt brought in via ems from home with right arm pain.  Pt fell 3 days ago.  No loc no n/v  pt has iv and 100 mcq fentanyl given by ems.

## 2022-06-30 NOTE — Discharge Instructions (Addendum)
Please follow-up with your surgeon tomorrow as planned unless your pain is much better.  If you get much worse tonight or have any shortness of breath or any other problems do not hesitate to return here even if it is 3:00 in the morning.  Please take the Vicodin 1 pill 4 times a day as needed for pain.  Be careful can make you groggy and constipated.  Do not fall.  Do not drive on it the police will consider you an impaired driver if they catch you.

## 2022-07-03 DIAGNOSIS — I2510 Atherosclerotic heart disease of native coronary artery without angina pectoris: Secondary | ICD-10-CM | POA: Diagnosis not present

## 2022-07-03 DIAGNOSIS — I10 Essential (primary) hypertension: Secondary | ICD-10-CM | POA: Diagnosis not present

## 2022-07-08 DIAGNOSIS — D2272 Melanocytic nevi of left lower limb, including hip: Secondary | ICD-10-CM | POA: Diagnosis not present

## 2022-07-08 DIAGNOSIS — C44619 Basal cell carcinoma of skin of left upper limb, including shoulder: Secondary | ICD-10-CM | POA: Diagnosis not present

## 2022-07-08 DIAGNOSIS — Z85820 Personal history of malignant melanoma of skin: Secondary | ICD-10-CM | POA: Diagnosis not present

## 2022-07-08 DIAGNOSIS — Z85828 Personal history of other malignant neoplasm of skin: Secondary | ICD-10-CM | POA: Diagnosis not present

## 2022-07-08 DIAGNOSIS — D2261 Melanocytic nevi of right upper limb, including shoulder: Secondary | ICD-10-CM | POA: Diagnosis not present

## 2022-07-08 DIAGNOSIS — L820 Inflamed seborrheic keratosis: Secondary | ICD-10-CM | POA: Diagnosis not present

## 2022-07-08 DIAGNOSIS — L570 Actinic keratosis: Secondary | ICD-10-CM | POA: Diagnosis not present

## 2022-07-08 DIAGNOSIS — D485 Neoplasm of uncertain behavior of skin: Secondary | ICD-10-CM | POA: Diagnosis not present

## 2022-07-13 DIAGNOSIS — S42201D Unspecified fracture of upper end of right humerus, subsequent encounter for fracture with routine healing: Secondary | ICD-10-CM | POA: Diagnosis not present

## 2022-07-13 DIAGNOSIS — S42301D Unspecified fracture of shaft of humerus, right arm, subsequent encounter for fracture with routine healing: Secondary | ICD-10-CM | POA: Diagnosis not present

## 2022-07-14 DIAGNOSIS — Z23 Encounter for immunization: Secondary | ICD-10-CM | POA: Diagnosis not present

## 2022-07-22 DIAGNOSIS — C44619 Basal cell carcinoma of skin of left upper limb, including shoulder: Secondary | ICD-10-CM | POA: Diagnosis not present

## 2022-08-04 ENCOUNTER — Encounter: Payer: Self-pay | Admitting: Oncology

## 2022-08-04 ENCOUNTER — Inpatient Hospital Stay (HOSPITAL_BASED_OUTPATIENT_CLINIC_OR_DEPARTMENT_OTHER): Payer: Medicare PPO | Admitting: Oncology

## 2022-08-04 ENCOUNTER — Inpatient Hospital Stay: Payer: Medicare PPO | Attending: Oncology

## 2022-08-04 DIAGNOSIS — Z86718 Personal history of other venous thrombosis and embolism: Secondary | ICD-10-CM | POA: Diagnosis not present

## 2022-08-04 DIAGNOSIS — N189 Chronic kidney disease, unspecified: Secondary | ICD-10-CM | POA: Diagnosis not present

## 2022-08-04 DIAGNOSIS — Z803 Family history of malignant neoplasm of breast: Secondary | ICD-10-CM | POA: Insufficient documentation

## 2022-08-04 DIAGNOSIS — Z90710 Acquired absence of both cervix and uterus: Secondary | ICD-10-CM | POA: Diagnosis not present

## 2022-08-04 DIAGNOSIS — I129 Hypertensive chronic kidney disease with stage 1 through stage 4 chronic kidney disease, or unspecified chronic kidney disease: Secondary | ICD-10-CM | POA: Diagnosis not present

## 2022-08-04 DIAGNOSIS — Z806 Family history of leukemia: Secondary | ICD-10-CM | POA: Insufficient documentation

## 2022-08-04 DIAGNOSIS — Z800 Family history of malignant neoplasm of digestive organs: Secondary | ICD-10-CM | POA: Insufficient documentation

## 2022-08-04 DIAGNOSIS — Z7901 Long term (current) use of anticoagulants: Secondary | ICD-10-CM | POA: Diagnosis not present

## 2022-08-04 DIAGNOSIS — K760 Fatty (change of) liver, not elsewhere classified: Secondary | ICD-10-CM

## 2022-08-04 DIAGNOSIS — Z808 Family history of malignant neoplasm of other organs or systems: Secondary | ICD-10-CM | POA: Diagnosis not present

## 2022-08-04 DIAGNOSIS — Z809 Family history of malignant neoplasm, unspecified: Secondary | ICD-10-CM | POA: Diagnosis not present

## 2022-08-04 DIAGNOSIS — Z8042 Family history of malignant neoplasm of prostate: Secondary | ICD-10-CM | POA: Diagnosis not present

## 2022-08-04 DIAGNOSIS — I4891 Unspecified atrial fibrillation: Secondary | ICD-10-CM | POA: Insufficient documentation

## 2022-08-04 LAB — CBC WITH DIFFERENTIAL/PLATELET
Abs Immature Granulocytes: 0.01 10*3/uL (ref 0.00–0.07)
Basophils Absolute: 0 10*3/uL (ref 0.0–0.1)
Basophils Relative: 0 %
Eosinophils Absolute: 0.1 10*3/uL (ref 0.0–0.5)
Eosinophils Relative: 3 %
HCT: 36.2 % (ref 36.0–46.0)
Hemoglobin: 11.1 g/dL — ABNORMAL LOW (ref 12.0–15.0)
Immature Granulocytes: 0 %
Lymphocytes Relative: 31 %
Lymphs Abs: 1.6 10*3/uL (ref 0.7–4.0)
MCH: 28.3 pg (ref 26.0–34.0)
MCHC: 30.7 g/dL (ref 30.0–36.0)
MCV: 92.3 fL (ref 80.0–100.0)
Monocytes Absolute: 0.3 10*3/uL (ref 0.1–1.0)
Monocytes Relative: 6 %
Neutro Abs: 3.2 10*3/uL (ref 1.7–7.7)
Neutrophils Relative %: 60 %
Platelets: 186 10*3/uL (ref 150–400)
RBC: 3.92 MIL/uL (ref 3.87–5.11)
RDW: 13.4 % (ref 11.5–15.5)
WBC: 5.3 10*3/uL (ref 4.0–10.5)
nRBC: 0 % (ref 0.0–0.2)

## 2022-08-04 LAB — IRON AND TIBC
Iron: 55 ug/dL (ref 28–170)
Saturation Ratios: 21 % (ref 10.4–31.8)
TIBC: 266 ug/dL (ref 250–450)
UIBC: 211 ug/dL

## 2022-08-04 LAB — HEPATIC FUNCTION PANEL
ALT: 11 U/L (ref 0–44)
AST: 20 U/L (ref 15–41)
Albumin: 3.4 g/dL — ABNORMAL LOW (ref 3.5–5.0)
Alkaline Phosphatase: 68 U/L (ref 38–126)
Bilirubin, Direct: 0.1 mg/dL (ref 0.0–0.2)
Indirect Bilirubin: 0.5 mg/dL (ref 0.3–0.9)
Total Bilirubin: 0.6 mg/dL (ref 0.3–1.2)
Total Protein: 6.1 g/dL — ABNORMAL LOW (ref 6.5–8.1)

## 2022-08-04 LAB — FERRITIN: Ferritin: 39 ng/mL (ref 11–307)

## 2022-08-04 NOTE — Assessment & Plan Note (Addendum)
#  Hereditary hemochromatosis, homozygous HD63 mutation.  Labs reviewed and discussed with patient. No signs of iron over load, no need for phlebotomy Recommend first degree relative to be screening.

## 2022-08-04 NOTE — Assessment & Plan Note (Signed)
Follow-up with GI 

## 2022-08-04 NOTE — Progress Notes (Signed)
Hematology/Oncology Progress note [phone] [phone]       Patient Care Team: Perrin Maltese, MD as PCP - General (Internal Medicine)  ASSESSMENT & PLAN:   Hereditary hemochromatosis [facility]) #Hereditary hemochromatosis, homozygous HD63 mutation.  Labs reviewed and discussed with patient. No signs of iron over load, no need for phlebotomy Recommend first degree relative to be screening.   Fatty liver disease, nonalcoholic Follow up with GI  Orders Placed This Encounter  Procedures   CBC with Differential/Platelet    Standing Status:   Future    Standing Expiration Date:   08/05/2023   Hepatic function panel    Standing Status:   Future    Standing Expiration Date:   08/05/2023   Ferritin    Standing Status:   Future    Standing Expiration Date:   08/05/2023   Iron and TIBC    Standing Status:   Future    Standing Expiration Date:   08/05/2023   Follow up in 1 year All questions were answered. The patient knows to call the clinic with any problems, questions or concerns.  Earlie Server, MD, PhD [facility] Hematology Oncology 08/04/2022   CHIEF COMPLAINTS/REASON FOR VISIT:  Follow up for homozygous hemochromatosis gene mutation, abnormal ultrasound liver  HISTORY OF PRESENTING ILLNESS:   Sarah Riddle is a  81 y.o.  female with PMH listed below was seen in consultation at the request of  Perrin Maltese, MD  for evaluation of anemia Reviewed records from primary care provider 04/09/2021, patient had a CBC done which showed a hemoglobin of 10.9, MCV 89.  Normal total WBC, normal platelet count.  Creatinine is slightly elevated and 1.13, EGFR 49, normal bilirubin. TSH 1.48 6 05/12/2021, repeat hemoglobin 10.4, MCV 91, normal platelet and total WBC. Patient reports some fatigue.  Denies any abdominal pain, black or bloody stool. Patient is on chronic anticoagulation with Coumadin for history of A. fib. Patient reports that she has previously taken oral  iron supplementation which caused diarrhea.  She has a personal history of melanoma x2 status post resection by dermatologist.  She has a family history significant for father and 2 brothers with prostate cancer, a sister with acute leukemia.  Mother with colon cancer.  Patient reports to have colonoscopy done recently.  No colonoscopy records and currently picks  10/20/2020, [place] right upper quadrant showed coarse increased echogenicity of the parenchyma with no focal mass in the liver.  patient has establish care with gastroenterology Dr. Virgina Jock 01/04/2022, ultrasound elastography of the liver showed diffusely increased hepatic parenchymal echogenicity with decreased acoustic through transmission most commonly reflects hepatic steatosis.  No overt hepatic lesion discovered. 01/18/2022, ultrasound right upper quadrant showed increased echogenicity of the liver parenchyma.  Family history of cancer, genetic testing was negative for pathogenic mutations.  INTERVAL HISTORY Sarah Riddle is a 81 y.o. female who has above history reviewed by me today presents for follow up visit for homozygous hemochromatosis gene mutation, abnormal liver ultrasound. Patient has no new complaints   Review of Systems  Constitutional:  Negative for appetite change, chills, fatigue and fever.  HENT:   Negative for hearing loss and voice change.   Eyes:  Negative for eye problems.  Respiratory:  Negative for chest tightness and cough.   Cardiovascular:  Negative for chest pain.  Gastrointestinal:  Negative for abdominal distention, abdominal pain and blood in stool.  Endocrine: Negative for hot flashes.  Genitourinary:  Negative for difficulty urinating and frequency.  Musculoskeletal:  Negative for arthralgias.  Skin:  Negative for itching and rash.  Neurological:  Negative for extremity weakness.  Hematological:  Negative for adenopathy.  Psychiatric/Behavioral:  Negative for confusion.     MEDICAL HISTORY:   Past Medical History:  Diagnosis Date   A-fib [facility])    Age related osteoporosis    Cancer ([place])    Melanoma x 2 -- Lt Leg -1976 Rt Arm - 2014   Chronic bilateral low back pain    Chronic kidney disease    Complication of anesthesia    DVT (deep venous thrombosis) ([place]) 1960   right leg  was on BC pills   Dysrhythmia    Family history of breast cancer    Family history of colon cancer    Family history of melanoma    Family history of prostate cancer    Fibrocystic breast disease    GERD (gastroesophageal reflux disease)    Headache    migraine   Heart murmur    not heard now.   History of kidney stones    Hyperlipidemia    Hypertension    IBS (irritable bowel syndrome)    Lumbago    Mitral valve prolapse    Neuropathy    Obstructive sleep apnea    Osteoarthritis of shoulder    PONV (postoperative nausea and vomiting)     SURGICAL HISTORY: Past Surgical History:  Procedure Laterality Date   ABDOMINAL HYSTERECTOMY     BACK SURGERY     BREAST CYST ASPIRATION Left 2001   Negative   CARDIAC ELECTROPHYSIOLOGY MAPPING AND ABLATION     EYE SURGERY     HUMERUS IM NAIL Right 05/05/2022   Procedure: INTRAMEDULLARY (IM) NAIL HUMERAL;  Surgeon: Shona Needles, MD;  Location: [facility];  Service: Orthopedics;  Laterality: Right;   HYSTEROTOMY     L4-L5  2020   titaum rebok cage   TOTAL HIP ARTHROPLASTY Left     SOCIAL HISTORY: Social History   Socioeconomic History   Marital status: Married    Spouse name: Not on file   Number of children: Not on file   Years of education: Not on file   Highest education level: Not on file  Occupational History   Not on file  Tobacco Use   Smoking status: Never   Smokeless tobacco: Never  Vaping Use   Vaping Use: Never used  Substance and Sexual Activity   Alcohol use: No   Drug use: No   Sexual activity: Not on file  Other Topics Concern   Not on file  Social History Narrative   Not on file   Social Determinants of Health    Financial Resource Strain: Not on file  Food Insecurity: Not on file  Transportation Needs: Not on file  Physical Activity: Not on file  Stress: Not on file  Social Connections: Not on file  Intimate Partner Violence: Not on file    FAMILY HISTORY: Family History  Problem Relation Age of Onset   Colon cancer Mother 36   Prostate cancer Father        dx 32s   Cancer Sister    Acute myelogenous leukemia Sister        dx 88s   Prostate cancer Brother    Melanoma Brother    Prostate cancer Brother    Melanoma Brother    HIV/AIDS Brother 66   Tuberculosis Paternal Aunt    Melanoma Other    Breast cancer Other  Melanoma Niece     ALLERGIES:  is allergic to iodinated contrast media, ibuprofen, lubiprostone, sulfa antibiotics, zolpidem, boniva [ibandronic acid], celecoxib, doxycycline, duloxetine, and tetracyclines & related.  MEDICATIONS:  Current Outpatient Medications  Medication Sig Dispense Refill   alendronate (FOSAMAX) 70 MG tablet Take 70 mg by mouth every Tuesday. Take with a full glass of water on an empty stomach.     allopurinol (ZYLOPRIM) 100 MG tablet Take 100 mg by mouth daily.     apixaban (ELIQUIS) 5 MG TABS tablet Take 5 mg by mouth 2 (two) times daily.     Calcium Carbonate-Vitamin D (CALTRATE 600+D PO) Take 1 tablet by mouth daily.     Cranberry 125 MG TABS Take 125 mg by mouth daily.     ezetimibe (ZETIA) 10 MG tablet Take 10 mg by mouth daily.     fluticasone (FLONASE) 50 MCG/ACT nasal spray Place 2 sprays into both nostrils daily.     gabapentin (NEURONTIN) 800 MG tablet Take 800 mg by mouth in the morning, at noon, in the evening, and at bedtime.     hydrALAZINE (APRESOLINE) 25 MG tablet Take 25 mg by mouth in the morning and at bedtime.     isosorbide mononitrate (IMDUR) 60 MG 24 hr tablet Take 60 mg by mouth daily.     levocetirizine (XYZAL) 5 MG tablet Take 5 mg by mouth every evening.     metoprolol succinate (TOPROL-XL) 25 MG 24 hr tablet Take  25 mg by mouth daily.     nitroGLYCERIN (NITROSTAT) 0.4 MG SL tablet Place 0.4 mg under the tongue every 5 (five) minutes as needed for chest pain.     ondansetron (ZOFRAN-ODT) 4 MG disintegrating tablet Take 1 tablet (4 mg total) by mouth every 8 (eight) hours as needed for nausea or vomiting. 15 tablet 0   pantoprazole (PROTONIX) 40 MG tablet Take 40 mg by mouth daily.     rosuvastatin (CRESTOR) 40 MG tablet Take 40 mg by mouth at bedtime.     sertraline (ZOLOFT) 25 MG tablet Take 25 mg by mouth 3 (three) times daily.      tetrahydrozoline-zinc (VISINE-AC) 0.05-0.25 % ophthalmic solution Place 2 drops into both eyes 3 (three) times daily as needed (dry eyes).     HYDROcodone-acetaminophen (NORCO/VICODIN) 5-325 MG tablet Take 1 tablet by mouth every 6 (six) hours as needed for moderate pain. 10 tablet 0   No current facility-administered medications for this visit.     PHYSICAL EXAMINATION: ECOG PERFORMANCE STATUS: 1 - Symptomatic but completely ambulatory Vitals:   08/04/22 1021  BP: (!) 157/64  Pulse: 64  Resp: 18  Temp: 97.8 F (36.6 C)   Filed Weights   08/04/22 1021  Weight: 207 lb 4.8 oz (94 kg)    Physical Exam Constitutional:      General: She is not in acute distress.    Comments: Patient ambulates independently  HENT:     Head: Normocephalic and atraumatic.  Eyes:     General: No scleral icterus. Cardiovascular:     Rate and Rhythm: Normal rate and regular rhythm.     Heart sounds: Normal heart sounds.  Pulmonary:     Effort: Pulmonary effort is normal. No respiratory distress.     Breath sounds: No wheezing.  Abdominal:     General: Bowel sounds are normal. There is no distension.     Palpations: Abdomen is soft.  Musculoskeletal:        General: No deformity. Normal range  of motion.     Cervical back: Normal range of motion and neck supple.  Skin:    General: Skin is warm and dry.     Findings: No erythema or rash.  Neurological:     Mental Status: She  is alert and oriented to person, place, and time. Mental status is at baseline.     Cranial Nerves: No cranial nerve deficit.     Coordination: Coordination normal.  Psychiatric:        Mood and Affect: Mood normal.     LABORATORY DATA:  I have reviewed the data as listed    Latest Ref Rng & Units 08/04/2022    9:30 AM 06/30/2022   10:24 PM 05/07/2022    2:18 AM  CBC  WBC 4.0 - 10.5 K/uL 5.3  6.3  6.6   Hemoglobin 12.0 - 15.0 g/dL 11.1  10.7  7.5   Hematocrit 36.0 - 46.0 % 36.2  34.9  23.5   Platelets 150 - 400 K/uL 186  193  197       Latest Ref Rng & Units 08/04/2022    9:30 AM 06/30/2022   10:24 PM 04/28/2022   12:05 PM  CMP  Glucose 70 - 99 mg/dL  105  128   BUN 8 - 23 mg/dL  12  17   Creatinine 0.44 - 1.00 mg/dL  0.76  0.66   Sodium 135 - 145 mmol/L  140  142   Potassium 3.5 - 5.1 mmol/L  3.6  3.6   Chloride 98 - 111 mmol/L  109  110   CO2 22 - 32 mmol/L  25  29   Calcium 8.9 - 10.3 mg/dL  8.5  9.0   Total Protein 6.5 - 8.1 g/dL 6.1     Total Bilirubin 0.3 - 1.2 mg/dL 0.6     Alkaline Phos 38 - 126 U/L 68     AST 15 - 41 U/L 20     ALT 0 - 44 U/L 11        Iron/TIBC/Ferritin/ %Sat    Component Value Date/Time   IRON 55 08/04/2022 0930   TIBC 266 08/04/2022 0930   FERRITIN 39 08/04/2022 0930   IRONPCTSAT 21 08/04/2022 0930      RADIOGRAPHIC STUDIES: I have personally reviewed the radiological images as listed and agreed with the findings in the report. DG Chest 2 View  Result Date: 06/30/2022 CLINICAL DATA:  Fall, pleuritic chest pain EXAM: CHEST - 2 VIEW COMPARISON:  08/19/2021 FINDINGS: The lungs are symmetrically expanded. Chronic interstitial thickening is unchanged. No superimposed confluent pulmonary infiltrate. No pneumothorax or pleural effusion. Cardiac size is within normal limits. Pulmonary vascularity is normal. No acute bone abnormality. IMPRESSION: No radiographic evidence of acute cardiopulmonary disease. Stable chronic interstitial changes.  Electronically Signed   By: Fidela Salisbury M.D.   On: 06/30/2022 22:58   DG Shoulder Right  Result Date: 06/30/2022 CLINICAL DATA:  Fall, right shoulder pain EXAM: RIGHT SHOULDER - 2+ VIEW COMPARISON:  Right humeral radiograph 05/05/2022 FINDINGS: Right humeral ORIF is partially visualized. Acute, comminuted proximal right humeral fracture is again identified, grossly unchanged from prior examination. Acromioclavicular and glenohumeral alignment is normal. Mild acromioclavicular degenerative arthritis. Glenohumeral joint space appears preserved. No interval fracture. No dislocation. Limited evaluation of the right hemithorax is unremarkable. IMPRESSION: Acute, comminuted proximal right humeral fracture, grossly unchanged from prior examination. No interval fracture.  No dislocation. Electronically Signed   By: Fidela Salisbury M.D.   On:  06/30/2022 22:57

## 2022-08-15 IMAGING — CR DG CHEST 2V
1 series · 2 of 2 positions shown · non-contrast
Comparison: August 02, 2021

CLINICAL DATA: Nonproductive cough and shortness of breath.

EXAM:
CHEST - 2 VIEW

[Series 1: dg chest 2 view · 0.14mm/px · 2 of 2 slices shown]
[im 1/2]
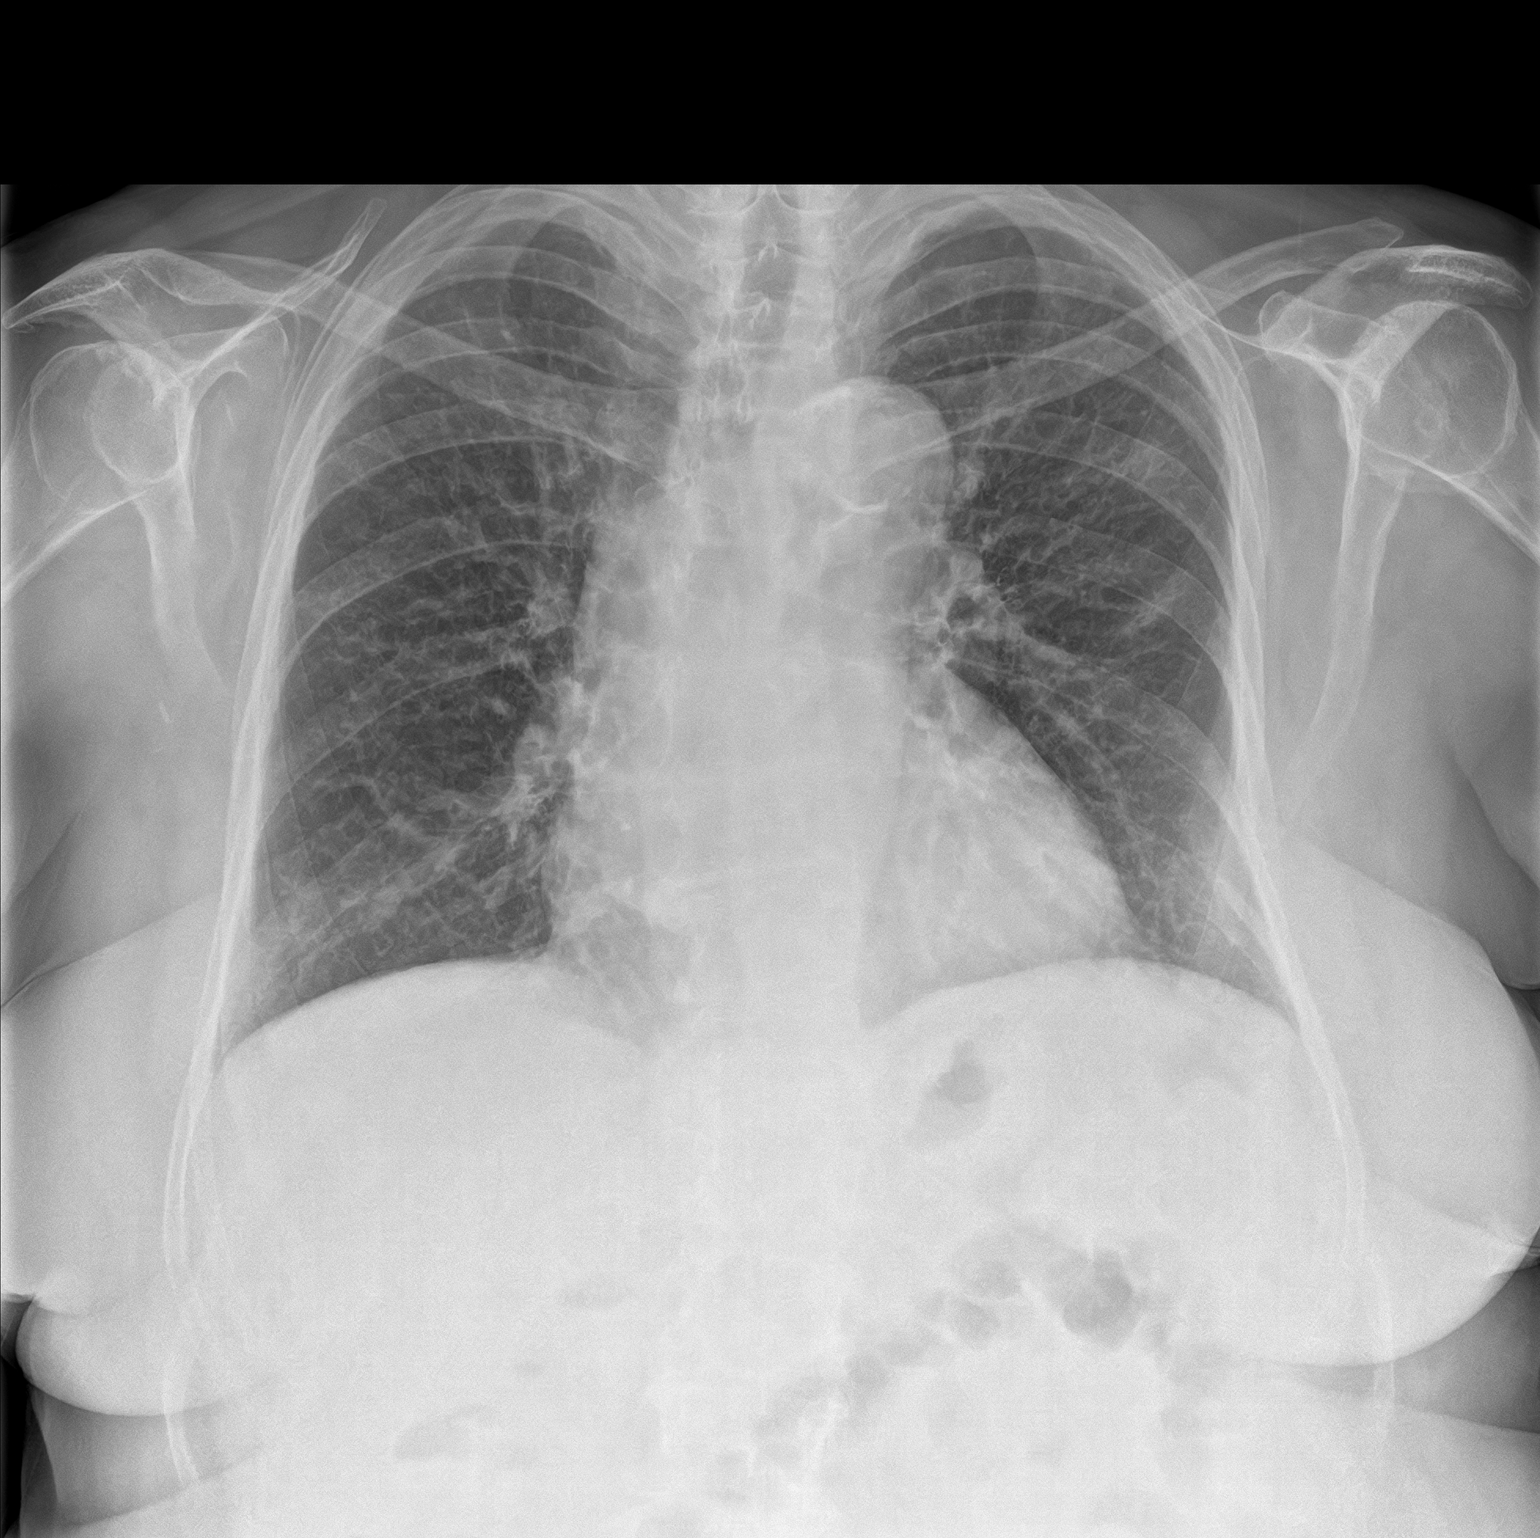
[im 2/2]
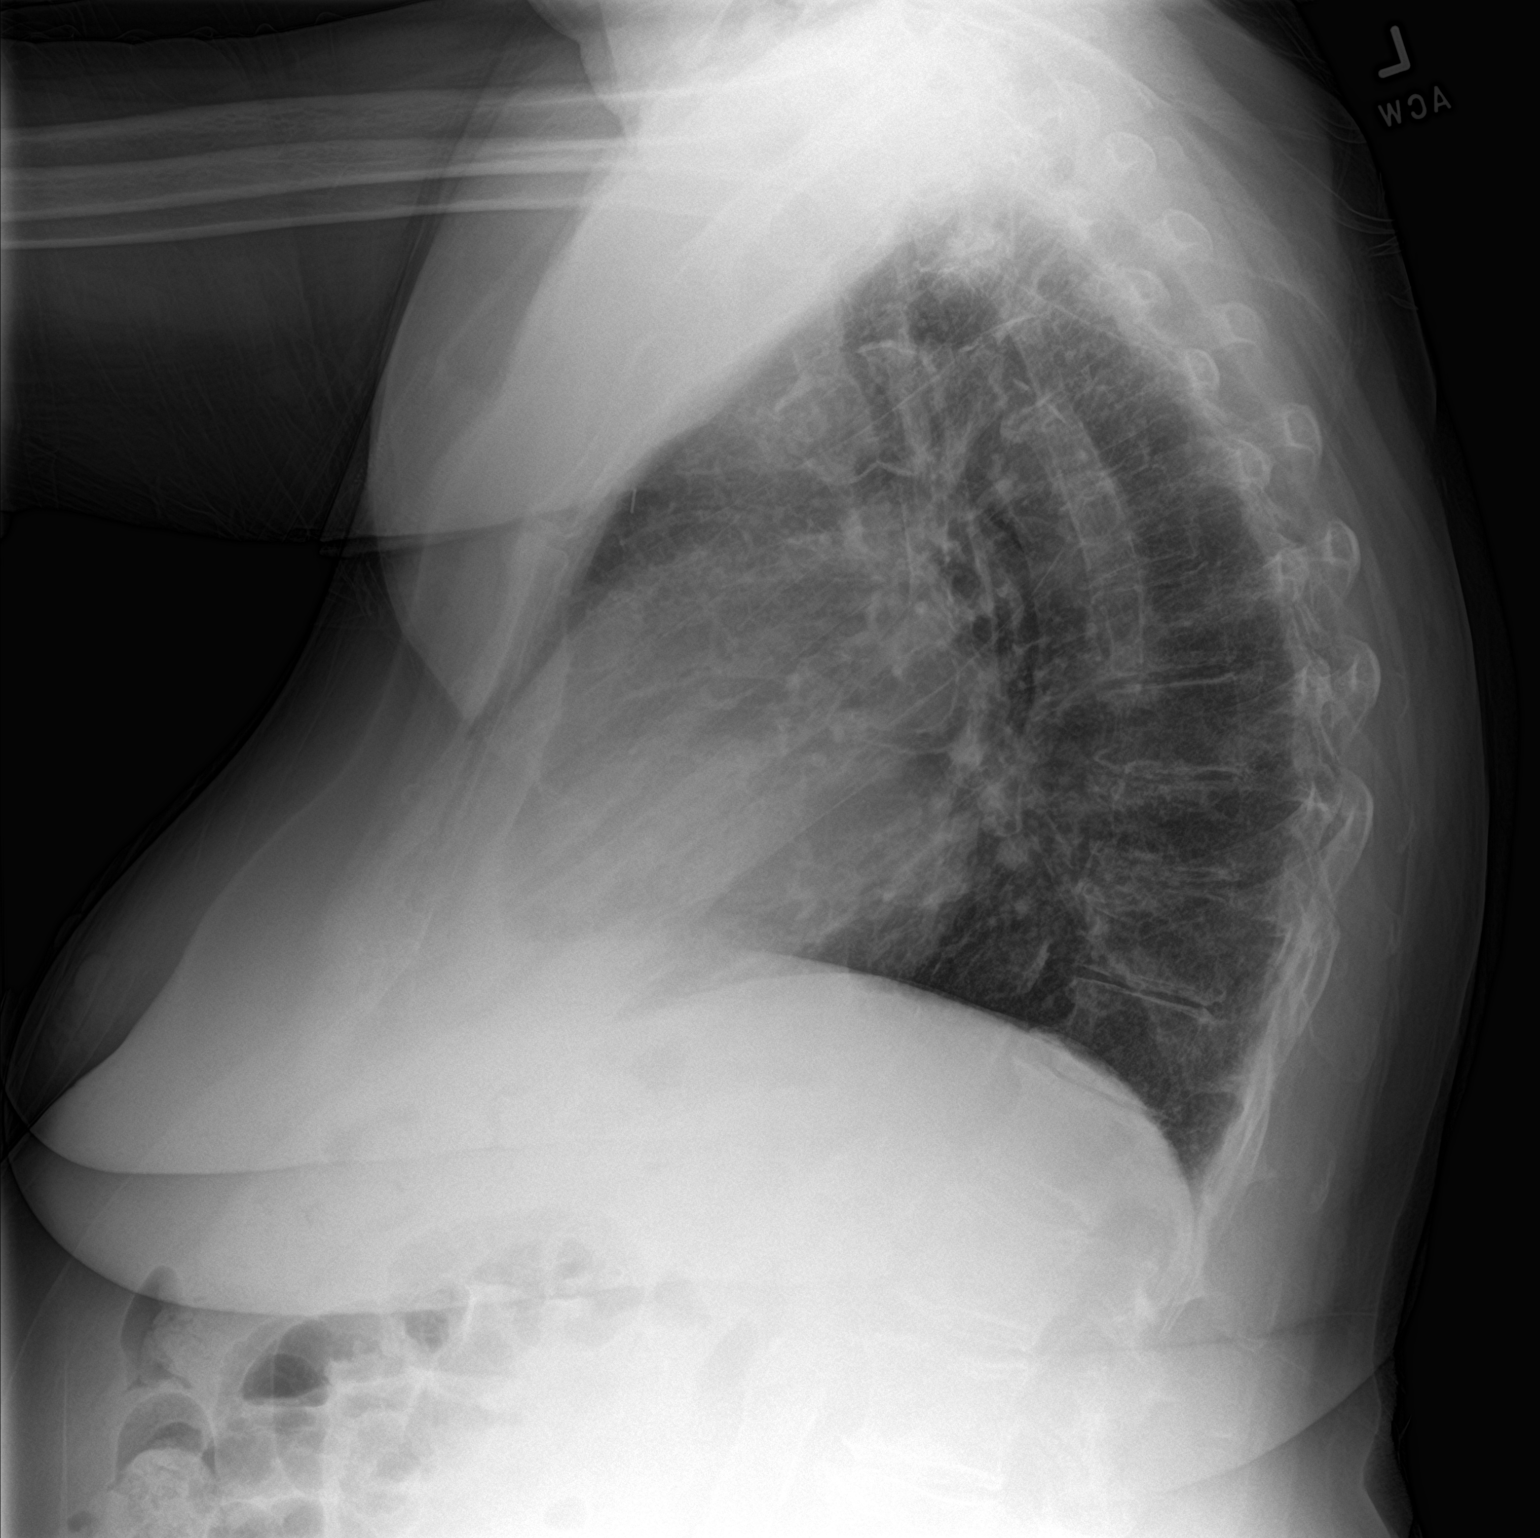

[2 of 2 positions shown; findings below may reference images not displayed]

FINDINGS: Diffuse mild to moderate severity chronic appearing increased
interstitial lung markings are seen. There is no evidence of acute
infiltrate, pleural effusion or pneumothorax. The heart size and
mediastinal contours are within normal limits. Moderate severity
calcification of the aortic arch is noted. The visualized skeletal
structures are unremarkable.
IMPRESSION: Stable exam without acute or active cardiopulmonary disease.

## 2022-09-09 DIAGNOSIS — Z0100 Encounter for examination of eyes and vision without abnormal findings: Secondary | ICD-10-CM | POA: Diagnosis not present

## 2022-09-09 DIAGNOSIS — T1501XA Foreign body in cornea, right eye, initial encounter: Secondary | ICD-10-CM | POA: Diagnosis not present

## 2022-09-09 DIAGNOSIS — E0590 Thyrotoxicosis, unspecified without thyrotoxic crisis or storm: Secondary | ICD-10-CM | POA: Diagnosis not present

## 2022-09-10 LAB — HISTOPLASMA ANTIGEN, URINE: Histoplasma Antigen, urine: <0.5 (ref <0.5)

## 2022-09-16 DIAGNOSIS — I480 Paroxysmal atrial fibrillation: Secondary | ICD-10-CM | POA: Diagnosis not present

## 2022-09-16 DIAGNOSIS — E0590 Thyrotoxicosis, unspecified without thyrotoxic crisis or storm: Secondary | ICD-10-CM | POA: Diagnosis not present

## 2022-10-02 DIAGNOSIS — I2510 Atherosclerotic heart disease of native coronary artery without angina pectoris: Secondary | ICD-10-CM | POA: Diagnosis not present

## 2022-10-02 DIAGNOSIS — I10 Essential (primary) hypertension: Secondary | ICD-10-CM | POA: Diagnosis not present

## 2022-10-16 IMAGING — US US ABDOMEN LIMITED
2 series · 14 of 25 positions shown · non-contrast
Comparison: None.
COMPARISON: None.

Addendum:
CLINICAL DATA: Hemochromatosis

EXAM:
ULTRASOUND ABDOMEN LIMITED RIGHT UPPER QUADRANT

[Series 1: us abdomen limited ruq (liver/gb) · 13 of 26 slices shown]
[im 1/26]
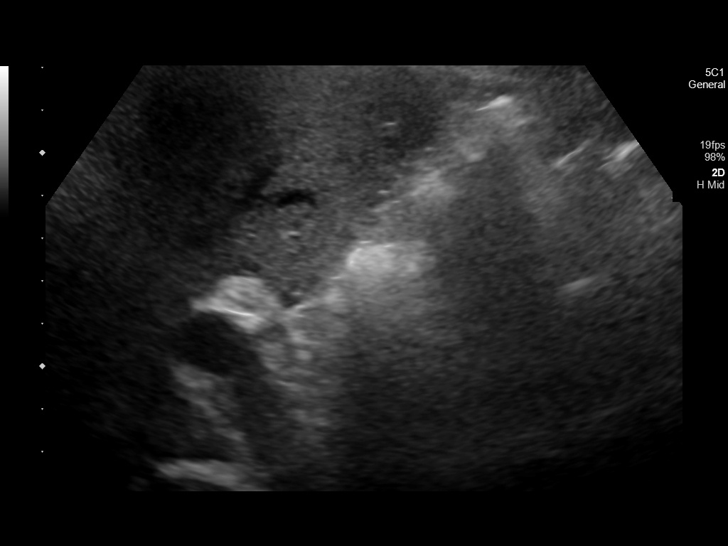
[im 3/26]
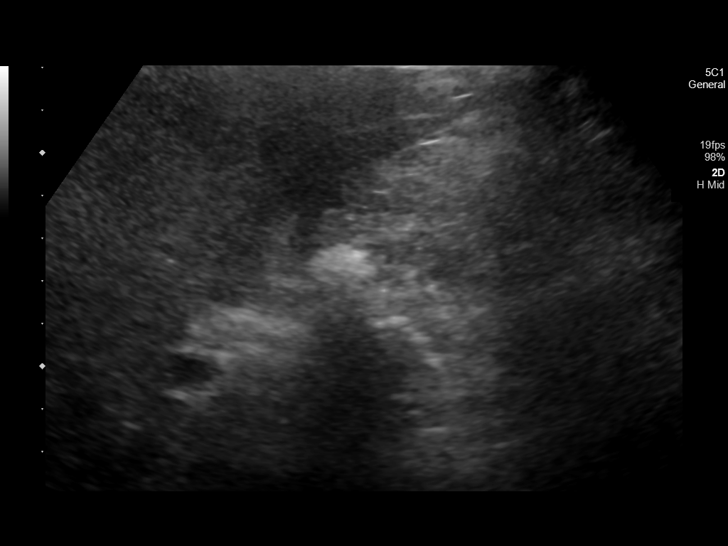
[im 5/26]
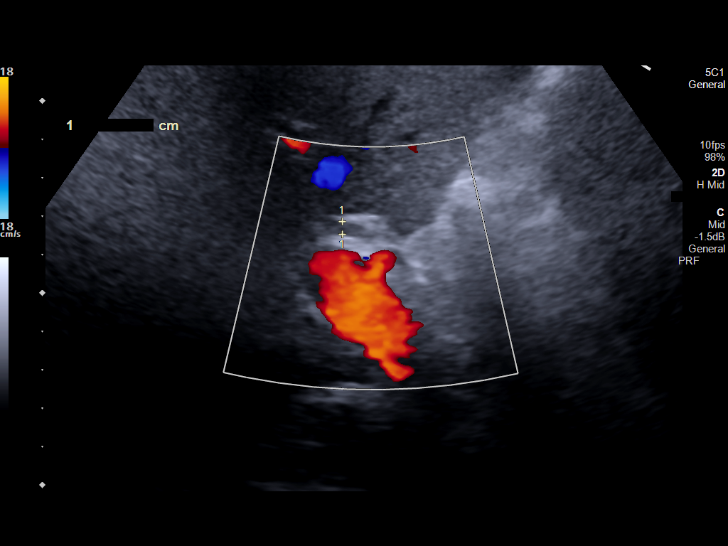
[im 7/26]
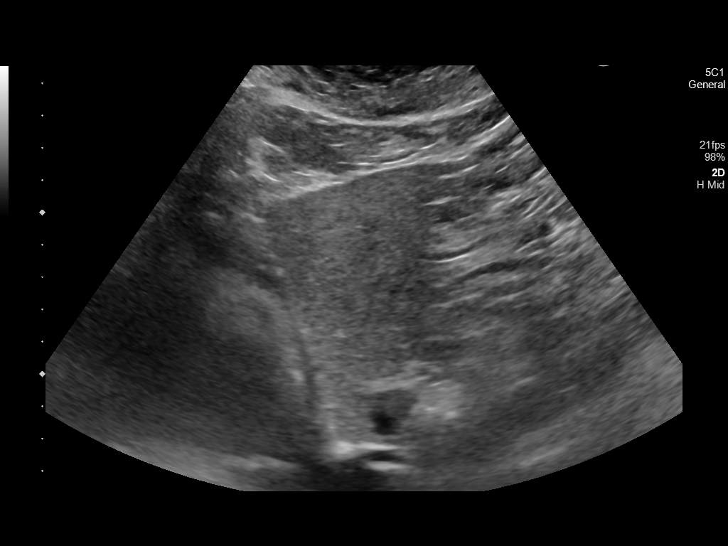
[im 10/26]
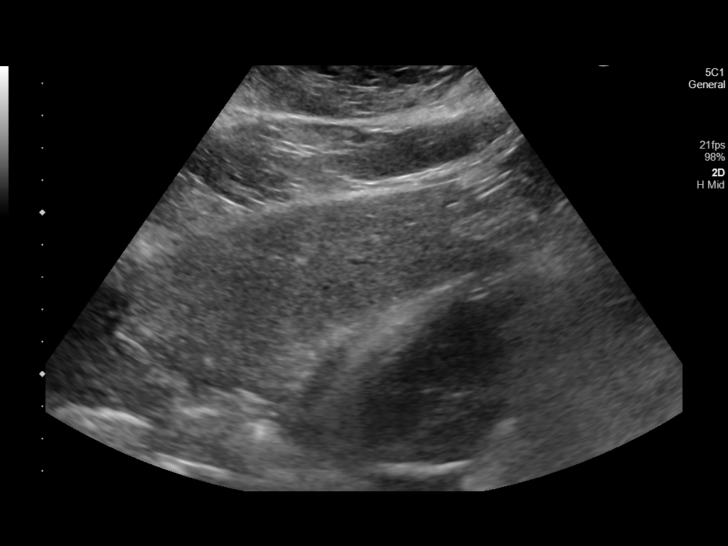
[im 11/26]
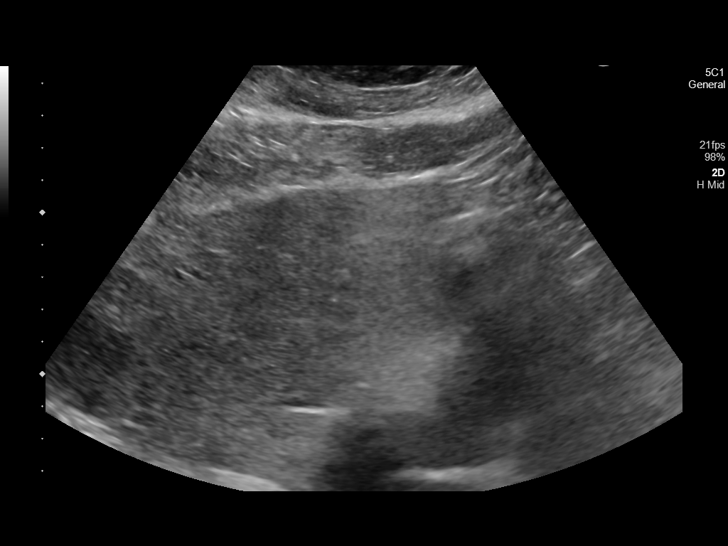
[im 13/26]
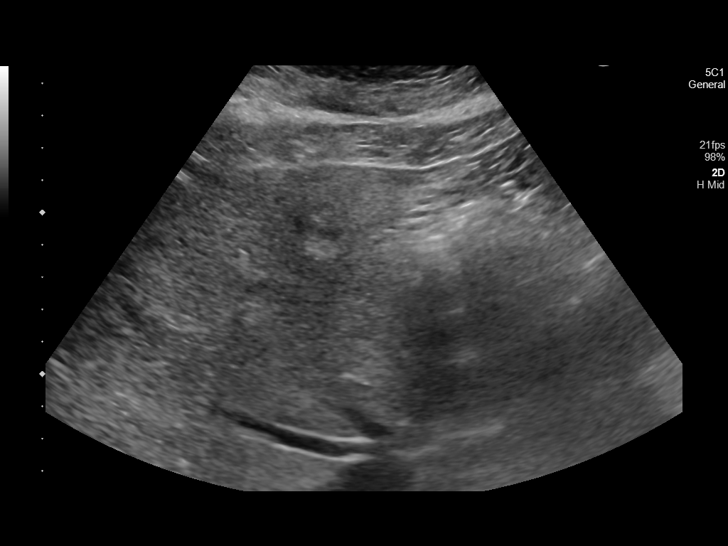
[im 15/26]
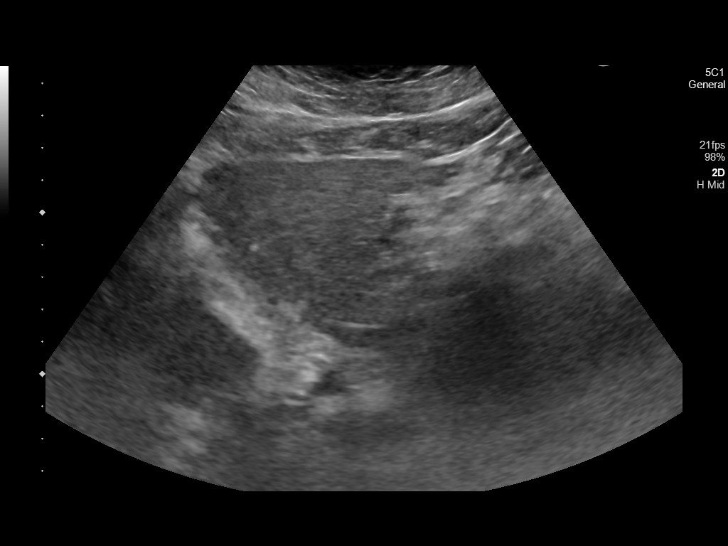
[im 18/26]
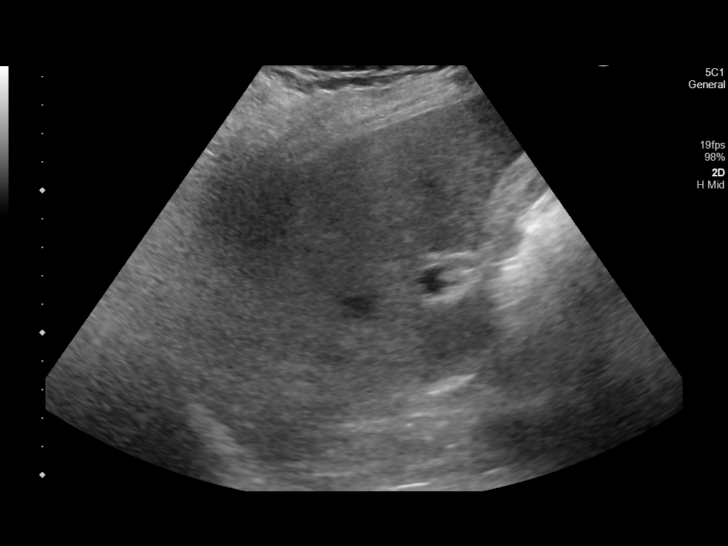
[im 19/26]
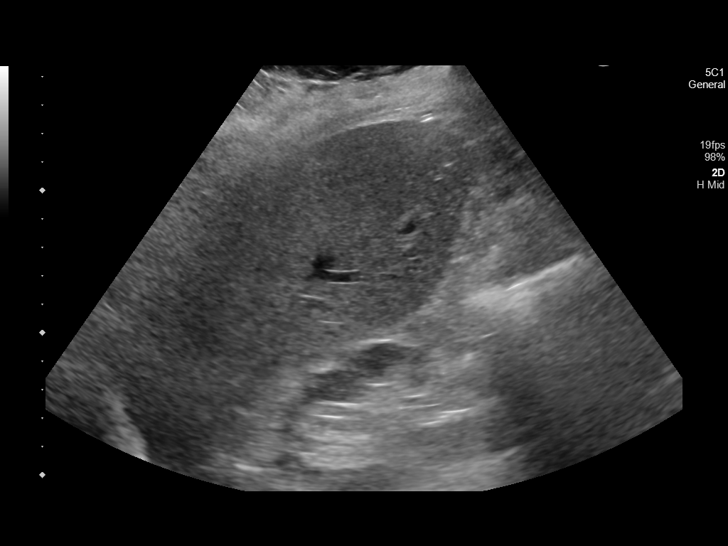
[im 21/26]
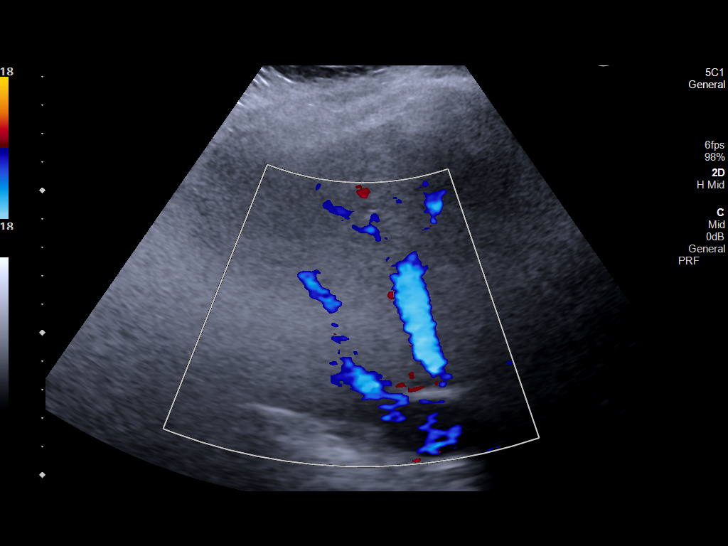
[im 23/26]
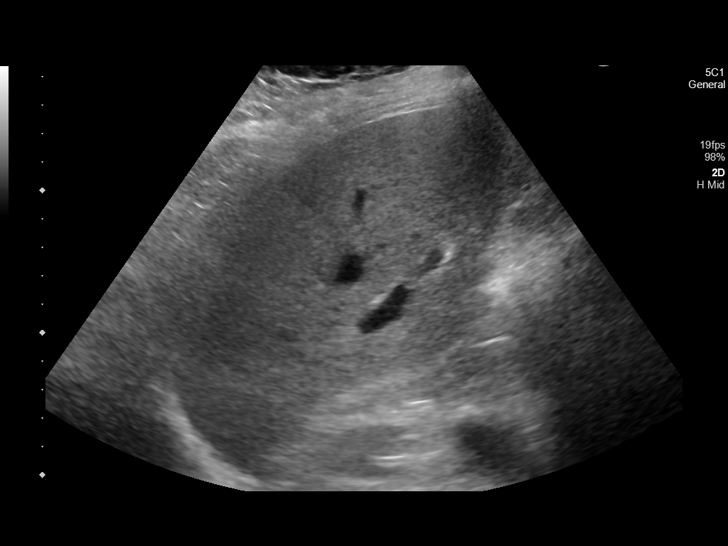
[im 26/26]
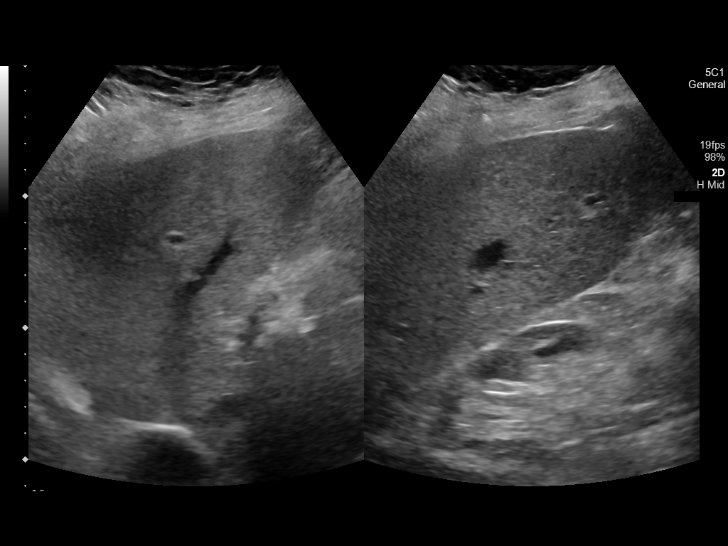

[Series 1001: general · 1 of 2 slices shown]
[im 2/2]
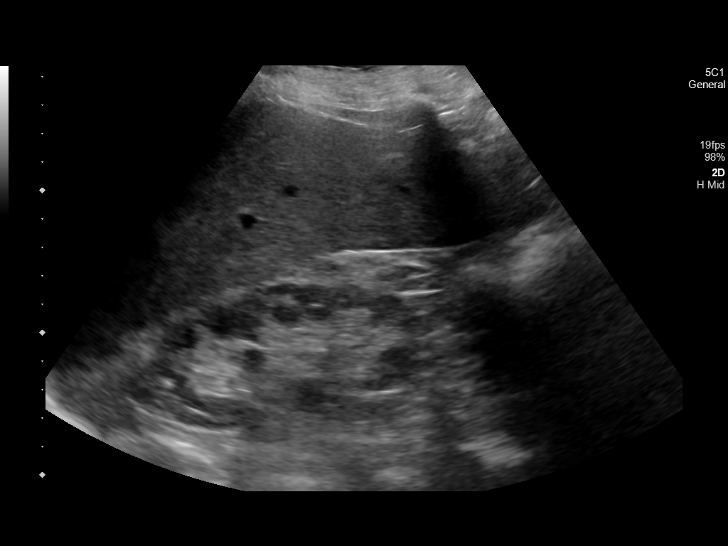

[14 of 25 positions shown; findings below may reference images not displayed]

FINDINGS: Gallbladder:

Surgically absent

Common bile duct:

Diameter: 3 mm

Liver:

Coarse, increased echogenicity of the parenchyma with no focal mass
identified. Portal vein is patent on color Doppler imaging with
normal direction of blood flow towards the liver.

Other: None.
IMPRESSION: Coarse, increased echogenicity of the liver parenchyma with no focal
mass identified.

ADDENDUM:
A clinical history of nonalcoholic fatty liver disease is also
noted.

*** End of Addendum ***
FINDINGS: Gallbladder:

Surgically absent

Common bile duct:

Diameter: 3 mm

Liver:

Coarse, increased echogenicity of the parenchyma with no focal mass
identified. Portal vein is patent on color Doppler imaging with
normal direction of blood flow towards the liver.

Other: None.
IMPRESSION: Coarse, increased echogenicity of the liver parenchyma with no focal
mass identified.

## 2022-10-18 DIAGNOSIS — I4891 Unspecified atrial fibrillation: Secondary | ICD-10-CM | POA: Diagnosis not present

## 2022-10-21 DIAGNOSIS — E782 Mixed hyperlipidemia: Secondary | ICD-10-CM | POA: Diagnosis not present

## 2022-10-21 DIAGNOSIS — R7303 Prediabetes: Secondary | ICD-10-CM | POA: Diagnosis not present

## 2022-10-21 DIAGNOSIS — I10 Essential (primary) hypertension: Secondary | ICD-10-CM | POA: Diagnosis not present

## 2022-10-21 DIAGNOSIS — R5383 Other fatigue: Secondary | ICD-10-CM | POA: Diagnosis not present

## 2022-10-25 DIAGNOSIS — I10 Essential (primary) hypertension: Secondary | ICD-10-CM | POA: Diagnosis not present

## 2022-10-25 DIAGNOSIS — E782 Mixed hyperlipidemia: Secondary | ICD-10-CM | POA: Diagnosis not present

## 2022-10-25 DIAGNOSIS — I509 Heart failure, unspecified: Secondary | ICD-10-CM | POA: Diagnosis not present

## 2022-10-25 DIAGNOSIS — R0602 Shortness of breath: Secondary | ICD-10-CM | POA: Diagnosis not present

## 2022-10-25 DIAGNOSIS — I4891 Unspecified atrial fibrillation: Secondary | ICD-10-CM | POA: Diagnosis not present

## 2022-10-25 DIAGNOSIS — I340 Nonrheumatic mitral (valve) insufficiency: Secondary | ICD-10-CM | POA: Diagnosis not present

## 2022-10-25 DIAGNOSIS — I2510 Atherosclerotic heart disease of native coronary artery without angina pectoris: Secondary | ICD-10-CM | POA: Diagnosis not present

## 2022-10-30 ENCOUNTER — Inpatient Hospital Stay
Admission: EM | Admit: 2022-10-30 | Discharge: 2022-11-02 | DRG: 282 | Disposition: A | Discharge status: Home / Self Care | Payer: Medicare PPO | Attending: Internal Medicine | Admitting: Internal Medicine

## 2022-10-30 ENCOUNTER — Other Ambulatory Visit: Payer: Self-pay

## 2022-10-30 ENCOUNTER — Emergency Department: Payer: Medicare PPO

## 2022-10-30 DIAGNOSIS — G894 Chronic pain syndrome: Secondary | ICD-10-CM | POA: Diagnosis present

## 2022-10-30 DIAGNOSIS — I2510 Atherosclerotic heart disease of native coronary artery without angina pectoris: Secondary | ICD-10-CM | POA: Diagnosis not present

## 2022-10-30 DIAGNOSIS — R0602 Shortness of breath: Secondary | ICD-10-CM | POA: Diagnosis not present

## 2022-10-30 DIAGNOSIS — M549 Dorsalgia, unspecified: Secondary | ICD-10-CM | POA: Diagnosis not present

## 2022-10-30 DIAGNOSIS — Z91041 Radiographic dye allergy status: Secondary | ICD-10-CM | POA: Diagnosis not present

## 2022-10-30 DIAGNOSIS — E876 Hypokalemia: Secondary | ICD-10-CM | POA: Diagnosis present

## 2022-10-30 DIAGNOSIS — Z85820 Personal history of malignant melanoma of skin: Secondary | ICD-10-CM | POA: Diagnosis not present

## 2022-10-30 DIAGNOSIS — G629 Polyneuropathy, unspecified: Secondary | ICD-10-CM | POA: Diagnosis present

## 2022-10-30 DIAGNOSIS — Z7901 Long term (current) use of anticoagulants: Secondary | ICD-10-CM

## 2022-10-30 DIAGNOSIS — Z881 Allergy status to other antibiotic agents status: Secondary | ICD-10-CM | POA: Diagnosis not present

## 2022-10-30 DIAGNOSIS — I160 Hypertensive urgency: Secondary | ICD-10-CM | POA: Diagnosis not present

## 2022-10-30 DIAGNOSIS — Z806 Family history of leukemia: Secondary | ICD-10-CM

## 2022-10-30 DIAGNOSIS — R7989 Other specified abnormal findings of blood chemistry: Secondary | ICD-10-CM | POA: Diagnosis not present

## 2022-10-30 DIAGNOSIS — R42 Dizziness and giddiness: Secondary | ICD-10-CM | POA: Diagnosis not present

## 2022-10-30 DIAGNOSIS — Z882 Allergy status to sulfonamides status: Secondary | ICD-10-CM | POA: Diagnosis not present

## 2022-10-30 DIAGNOSIS — Z888 Allergy status to other drugs, medicaments and biological substances status: Secondary | ICD-10-CM

## 2022-10-30 DIAGNOSIS — N189 Chronic kidney disease, unspecified: Secondary | ICD-10-CM | POA: Diagnosis present

## 2022-10-30 DIAGNOSIS — Z808 Family history of malignant neoplasm of other organs or systems: Secondary | ICD-10-CM

## 2022-10-30 DIAGNOSIS — M109 Gout, unspecified: Secondary | ICD-10-CM | POA: Diagnosis present

## 2022-10-30 DIAGNOSIS — Z90710 Acquired absence of both cervix and uterus: Secondary | ICD-10-CM | POA: Diagnosis not present

## 2022-10-30 DIAGNOSIS — I10 Essential (primary) hypertension: Secondary | ICD-10-CM | POA: Diagnosis not present

## 2022-10-30 DIAGNOSIS — M546 Pain in thoracic spine: Secondary | ICD-10-CM | POA: Diagnosis not present

## 2022-10-30 DIAGNOSIS — Z96642 Presence of left artificial hip joint: Secondary | ICD-10-CM | POA: Diagnosis present

## 2022-10-30 DIAGNOSIS — E785 Hyperlipidemia, unspecified: Secondary | ICD-10-CM | POA: Diagnosis present

## 2022-10-30 DIAGNOSIS — I4891 Unspecified atrial fibrillation: Secondary | ICD-10-CM | POA: Diagnosis present

## 2022-10-30 DIAGNOSIS — I214 Non-ST elevation (NSTEMI) myocardial infarction: Secondary | ICD-10-CM

## 2022-10-30 DIAGNOSIS — I161 Hypertensive emergency: Principal | ICD-10-CM | POA: Diagnosis present

## 2022-10-30 DIAGNOSIS — Z79899 Other long term (current) drug therapy: Secondary | ICD-10-CM

## 2022-10-30 DIAGNOSIS — Z86718 Personal history of other venous thrombosis and embolism: Secondary | ICD-10-CM | POA: Diagnosis not present

## 2022-10-30 DIAGNOSIS — G4733 Obstructive sleep apnea (adult) (pediatric): Secondary | ICD-10-CM | POA: Diagnosis present

## 2022-10-30 DIAGNOSIS — R079 Chest pain, unspecified: Secondary | ICD-10-CM | POA: Diagnosis not present

## 2022-10-30 DIAGNOSIS — M810 Age-related osteoporosis without current pathological fracture: Secondary | ICD-10-CM | POA: Diagnosis present

## 2022-10-30 DIAGNOSIS — K219 Gastro-esophageal reflux disease without esophagitis: Secondary | ICD-10-CM | POA: Diagnosis present

## 2022-10-30 DIAGNOSIS — G608 Other hereditary and idiopathic neuropathies: Secondary | ICD-10-CM | POA: Diagnosis present

## 2022-10-30 DIAGNOSIS — Z800 Family history of malignant neoplasm of digestive organs: Secondary | ICD-10-CM | POA: Diagnosis not present

## 2022-10-30 DIAGNOSIS — Z886 Allergy status to analgesic agent status: Secondary | ICD-10-CM | POA: Diagnosis not present

## 2022-10-30 DIAGNOSIS — Z830 Family history of human immunodeficiency virus [HIV] disease: Secondary | ICD-10-CM

## 2022-10-30 DIAGNOSIS — I21A1 Myocardial infarction type 2: Secondary | ICD-10-CM | POA: Diagnosis present

## 2022-10-30 DIAGNOSIS — Z803 Family history of malignant neoplasm of breast: Secondary | ICD-10-CM | POA: Diagnosis not present

## 2022-10-30 DIAGNOSIS — Z7983 Long term (current) use of bisphosphonates: Secondary | ICD-10-CM

## 2022-10-30 LAB — CBC
HCT: 40.2 % (ref 36.0–46.0)
Hemoglobin: 12.9 g/dL (ref 12.0–15.0)
MCH: 29.1 pg (ref 26.0–34.0)
MCHC: 32.1 g/dL (ref 30.0–36.0)
MCV: 90.5 fL (ref 80.0–100.0)
Platelets: 195 10*3/uL (ref 150–400)
RBC: 4.44 MIL/uL (ref 3.87–5.11)
RDW: 14.3 % (ref 11.5–15.5)
WBC: 6.1 10*3/uL (ref 4.0–10.5)
nRBC: 0 % (ref 0.0–0.2)

## 2022-10-30 LAB — BASIC METABOLIC PANEL
Anion gap: 11 (ref 5–15)
BUN: 10 mg/dL (ref 8–23)
CO2: 26 mmol/L (ref 22–32)
Calcium: 9.7 mg/dL (ref 8.9–10.3)
Chloride: 103 mmol/L (ref 98–111)
Creatinine, Ser: 0.64 mg/dL (ref 0.44–1.00)
GFR, Estimated: >60 mL/min (ref >60)
Glucose, Bld: 107 mg/dL — ABNORMAL HIGH (ref 70–99)
Potassium: 3.2 mmol/L — ABNORMAL LOW (ref 3.5–5.1)
Sodium: 140 mmol/L (ref 135–145)

## 2022-10-30 LAB — TROPONIN I (HIGH SENSITIVITY)
Troponin I (High Sensitivity): 119 ng/L (ref <18)
Troponin I (High Sensitivity): 642 ng/L (ref <18)

## 2022-10-30 MED ORDER — NITROGLYCERIN IN D5W 200-5 MCG/ML-% IV SOLN
0.0000 ug/min | INTRAVENOUS | Status: DC
  Administered 2022-10-30: 5 ug/min via INTRAVENOUS
  Administered 2022-10-31: 135 ug/min via INTRAVENOUS
  Filled 2022-10-30 (×3): qty 250

## 2022-10-30 MED ORDER — HEPARIN (PORCINE) 25000 UT/250ML-% IV SOLN
1150.0000 [IU]/h | INTRAVENOUS | Status: DC
  Administered 2022-10-31: 1150 [IU]/h via INTRAVENOUS
  Filled 2022-10-30: qty 250

## 2022-10-30 MED ORDER — DIPHENHYDRAMINE HCL 25 MG PO CAPS: 50.0000 mg | ORAL_CAPSULE | Freq: Once | ORAL | Status: DC

## 2022-10-30 MED ORDER — METHYLPREDNISOLONE SODIUM SUCC 40 MG IJ SOLR
40.0000 mg | Freq: Once | INTRAMUSCULAR | Status: AC
  Administered 2022-10-30: 40 mg via INTRAVENOUS
  Filled 2022-10-30: qty 1

## 2022-10-30 MED ORDER — DIPHENHYDRAMINE HCL 50 MG/ML IJ SOLN
50.0000 mg | Freq: Once | INTRAMUSCULAR | Status: DC
  Filled 2022-10-30: qty 1

## 2022-10-30 MED ORDER — HEPARIN BOLUS VIA INFUSION
4000.0000 [IU] | Freq: Once | INTRAVENOUS | Status: AC
  Administered 2022-10-31: 4000 [IU] via INTRAVENOUS
  Filled 2022-10-30: qty 4000

## 2022-10-30 NOTE — ED Notes (Signed)
Cat RN notified pt in room, placed on monitor, family at bedside.

## 2022-10-30 NOTE — H&P (Incomplete)
History and Physical    Sarah Riddle LKT:[id number] DOB: [birth year] DOA: 10/30/2022  PCP: Sarah Maltese, MD  Patient coming from: ***  I have personally briefly reviewed patient's old medical records in [place]  Chief Complaint: ***  HPI: Sarah Riddle is a 82 y.o. female with medical history significant of *** (For level 3, the HPI must include 4+ descriptors: Location, Quality, Severity, Duration, Timing, Context, modifying factors, associated signs/symptoms and/or status of 3+ chronic problems.)  (Please avoid self-populating past medical history here) (The initial 2-3 lines should be focused and good to copy and paste in the HPI section of the daily progress note).  ED Course: ***  Review of Systems: As per HPI otherwise all other systems reviewed and are negative. Unacceptable ROS statements: "10 systems reviewed," "Extensive" (without elaboration).  Acceptable ROS statements: "All others negative," "All others reviewed and are negative," and "All others unremarkable," with at [place] documented Can't double dip - if using for HPI can't use for ROS  Past Medical History:  Diagnosis Date  . A-fib (Sarah Riddle)   . Age related osteoporosis   . Cancer (Sarah Riddle)    Melanoma x 2 -- Lt Leg -1976 Rt Arm - 2014  . Chronic bilateral low back pain   . Chronic kidney disease   . Complication of anesthesia   . DVT (deep venous thrombosis) (Sarah Riddle) 1960   right leg  was on BC pills  . Dysrhythmia   . Family history of breast cancer   . Family history of colon cancer   . Family history of melanoma   . Family history of prostate cancer   . Fibrocystic breast disease   . GERD (gastroesophageal reflux disease)   . Headache    migraine  . Heart murmur    not heard now.  . History of kidney stones   . Hyperlipidemia   . Hypertension   . IBS (irritable bowel syndrome)   . Lumbago   . Mitral valve prolapse   . Neuropathy   . Obstructive sleep apnea   . Osteoarthritis of  shoulder   . PONV (postoperative nausea and vomiting)     Past Surgical History:  Procedure Laterality Date  . ABDOMINAL HYSTERECTOMY    . BACK SURGERY    . BREAST CYST ASPIRATION Left 2001   Negative  . CARDIAC ELECTROPHYSIOLOGY MAPPING AND ABLATION    . EYE SURGERY    . HUMERUS IM NAIL Right 05/05/2022   Procedure: INTRAMEDULLARY (IM) NAIL HUMERAL;  Surgeon: Sarah Needles, MD;  Location: [facility];  Service: Orthopedics;  Laterality: Right;  . HYSTEROTOMY    . L4-L5  2020   titaum rebok cage  . TOTAL HIP ARTHROPLASTY Left     Social History  reports that she has never smoked. She has never used smokeless tobacco. She reports that she does not drink alcohol and does not use drugs.  Allergies  Allergen Reactions  . Iodinated Contrast Media Shortness Of Breath    Can be premedicated  . Ibuprofen Other (See Comments)    "stomach problems"   . Lubiprostone Nausea And Vomiting       . Sulfa Antibiotics Other (See Comments)    "Stomach problems"   . Zolpidem Other (See Comments)    "hallucinations"  Other reaction(s): Confusion  . Boniva [Ibandronic Acid] Diarrhea and Nausea And Vomiting  . Celecoxib Other (See Comments)    GI upset   . Doxycycline Diarrhea and Nausea And  Vomiting  . Duloxetine Other (See Comments)    Over sedation with Zelnorm (IBS med)  . Tetracyclines & Related Nausea Only and Nausea And Vomiting    Family History  Problem Relation Age of Onset  . Colon cancer Mother 70  . Prostate cancer Father        dx 23s  . Cancer Sister   . Acute myelogenous leukemia Sister        dx 76s  . Prostate cancer Brother   . Melanoma Brother   . Prostate cancer Brother   . Melanoma Brother   . HIV/AIDS Brother 45  . Tuberculosis Paternal Aunt   . Melanoma Other   . Breast cancer Other   . Melanoma Niece    *** Unacceptable: Noncontributory, unremarkable, or negative. Acceptable: (example)Family history negative for heart disease  Prior to Admission  medications   Medication Sig Start Date End Date Taking? Authorizing Provider  alendronate (FOSAMAX) 70 MG tablet Take 70 mg by mouth every Tuesday. Take with a full glass of water on an empty stomach.    [provider]  allopurinol (ZYLOPRIM) 100 MG tablet Take 100 mg by mouth daily. 06/22/17   [provider]  apixaban (ELIQUIS) 5 MG TABS tablet Take 5 mg by mouth 2 (two) times daily. 04/19/22   [provider]  Calcium Carbonate-Vitamin D (CALTRATE 600+D PO) Take 1 tablet by mouth daily.    [provider]  Cranberry 125 MG TABS Take 125 mg by mouth daily.    [provider]  ezetimibe (ZETIA) 10 MG tablet Take 10 mg by mouth daily. 08/01/17   [provider]  fluticasone (FLONASE) 50 MCG/ACT nasal spray Place 2 sprays into both nostrils daily. 07/31/17   [provider]  gabapentin (NEURONTIN) 800 MG tablet Take 800 mg by mouth in the morning, at noon, in the evening, and at bedtime.    [provider]  hydrALAZINE (APRESOLINE) 25 MG tablet Take 25 mg by mouth in the morning and at bedtime.    [provider]  HYDROcodone-acetaminophen (NORCO/VICODIN) 5-325 MG tablet Take 1 tablet by mouth every 6 (six) hours as needed for moderate pain. 06/30/22   Sarah Polio, MD  isosorbide mononitrate (IMDUR) 60 MG 24 hr tablet Take 60 mg by mouth daily.    [provider]  levocetirizine (XYZAL) 5 MG tablet Take 5 mg by mouth every evening. 07/31/17   [provider]  metoprolol succinate (TOPROL-XL) 25 MG 24 hr tablet Take 25 mg by mouth daily.    [provider]  nitroGLYCERIN (NITROSTAT) 0.4 MG SL tablet Place 0.4 mg under the tongue every 5 (five) minutes as needed for chest pain. 01/05/22   [provider]  ondansetron (ZOFRAN-ODT) 4 MG disintegrating tablet Take 1 tablet (4 mg total) by mouth every 8 (eight) hours as needed for nausea or vomiting. 04/28/22   Menshew, Dannielle Karvonen, PA-C   pantoprazole (PROTONIX) 40 MG tablet Take 40 mg by mouth daily. 08/22/17   [provider]  rosuvastatin (CRESTOR) 40 MG tablet Take 40 mg by mouth at bedtime.    [provider]  sertraline (ZOLOFT) 25 MG tablet Take 25 mg by mouth 3 (three) times daily.     [provider]  tetrahydrozoline-zinc (VISINE-AC) 0.05-0.25 % ophthalmic solution Place 2 drops into both eyes 3 (three) times daily as needed (dry eyes).    [provider]    Physical Exam: Vitals:   10/30/22 2210  10/30/22 2220 10/30/22 2230 10/30/22 2300  BP: (!) 169/92 (!) 178/98 (!) 176/95 (!) 157/115  Pulse: 83 90 92 89  Resp: (!) '21 18 15 '$ (!) 24  Temp:      TempSrc:      SpO2: 99% 98% 97% 95%  Weight:        Constitutional: NAD, calm, comfortable Vitals:   10/30/22 2210 10/30/22 2220 10/30/22 2230 10/30/22 2300  BP: (!) 169/92 (!) 178/98 (!) 176/95 (!) 157/115  Pulse: 83 90 92 89  Resp: (!) '21 18 15 '$ (!) 24  Temp:      TempSrc:      SpO2: 99% 98% 97% 95%  Weight:       Eyes: PERRL, lids and conjunctivae normal ENMT: Mucous membranes are moist. Posterior pharynx clear of any exudate or lesions.Normal dentition.  Neck: normal, supple, no masses, no thyromegaly Respiratory: clear to auscultation bilaterally, no wheezing, no crackles. Normal respiratory effort. No accessory muscle use.  Cardiovascular: Regular rate and rhythm, no murmurs / rubs / gallops. No extremity edema. 2+ pedal pulses. No carotid bruits.  Abdomen: no tenderness, no masses palpated. No hepatosplenomegaly. Bowel sounds positive.  Musculoskeletal: no clubbing / cyanosis. No joint deformity upper and lower extremities. Good ROM, no contractures. Normal muscle tone.  Skin: no rashes, lesions, ulcers. No induration Neurologic: CN 2-12 grossly intact. Sensation intact, DTR normal. Strength 5/5 in all 4.  Psychiatric: Normal judgment and insight. Alert and oriented x 3. Normal mood.   (Anything < 9 systems with 2  bullets each down codes to level 1) (If patient refuses exam can't bill higher level) (Make sure to document decubitus ulcers present on admission -- if possible -- and whether patient has chronic indwelling catheter at time of admission)  Labs on Admission: I have personally reviewed following labs and imaging studies  CBC: Recent Labs  Lab 10/30/22 2016  WBC 6.1  HGB 12.9  HCT 40.2  MCV 90.5  PLT 259    Basic Metabolic Panel: Recent Labs  Lab 10/30/22 2016  NA 140  K 3.2*  CL 103  CO2 26  GLUCOSE 107*  BUN 10  CREATININE 0.64  CALCIUM 9.7    GFR: Estimated Creatinine Clearance: 66.3 mL/min (by C-G formula based on SCr of 0.64 mg/dL).  Liver Function Tests: No results for input(s): "AST", "ALT", "ALKPHOS", "BILITOT", "PROT", "ALBUMIN" in the last 168 hours.  Urine analysis:    Component Value Date/Time   COLORURINE YELLOW (A) 02/16/2022 2117   APPEARANCEUR HAZY (A) 02/16/2022 2117   LABSPEC 1.014 02/16/2022 2117   PHURINE 5.0 02/16/2022 2117   GLUCOSEU NEGATIVE 02/16/2022 2117   HGBUR NEGATIVE 02/16/2022 2117   BILIRUBINUR NEGATIVE 02/16/2022 2117   KETONESUR NEGATIVE 02/16/2022 2117   PROTEINUR NEGATIVE 02/16/2022 2117   NITRITE NEGATIVE 02/16/2022 2117   LEUKOCYTESUR LARGE (A) 02/16/2022 2117    Radiological Exams on Admission: DG Chest 2 View  Result Date: 10/30/2022 CLINICAL DATA:  Shortness of breath and back pain EXAM: CHEST - 2 VIEW COMPARISON:  06/30/2022 FINDINGS: Stable cardiomediastinal silhouette. Aortic atherosclerotic calcification. No focal consolidation, pleural effusion, or pneumothorax. Chronic interstitial coarsening, unchanged. No acute osseous abnormality. IMPRESSION: No active cardiopulmonary disease. Stable chronic interstitial coarsening. Electronically Signed   By: Placido Sou M.D.   On: 10/30/2022 20:34    EKG: Independently reviewed. ***  Assessment/Plan Active Problems:   * No active hospital problems. *  (please populate  well all problems here in Problem List. (For example, if patient  is on BP meds at home and you resume or decide to hold them, it is a problem that needs to be her. Same for CAD, COPD, HLD and so on)   ***  DVT prophylaxis: *** (Lovenox/Heparin/SCD's/anticoagulated/None (if comfort care) Code Status:   *** (Full/Partial (specify details) Family Communication:  *** (Specify name, relationship. Do not write "discussed with patient". Specify tel # if discussed over the phone) Disposition Plan:   Patient is from:  ***  Anticipated DC to:  ***  Anticipated DC date:  ***  Anticipated DC barriers: ***  Consults called:  *** (with names) Admission status:  *** (inpatient / obs / tele / medical floor / SDU)  Severity of Illness: {Observation/Inpatient:21159}    Gilles Chiquito MD [facility]  How to contact the [facility] Attending or Consulting provider [place] or covering provider during after hours [place], for this patient?   Check the care team in [facility] and look for a) attending/consulting [facility] provider listed and b) the [facility] team listed Log into [email or web address] and use [place] universal password to access. If you do not have the password, please contact the hospital operator. Locate the [facility] provider you are looking for under [facility] and page to a number that you can be directly reached. If you still have difficulty reaching the provider, please page the [facility] (Director on Call) for the Hospitalists listed on amion for assistance.  10/30/2022, 11:59 PM

## 2022-10-30 NOTE — ED Triage Notes (Signed)
Pt in from home via [facility] with HTN (204/90). Pt states she initially woke with dizziness this morning, checked her BP and it was 180/90's. She states she called the oncall cardiologist and he said to take an extra dose of Lisinopril, but her BP remains elevated. At 6pm, she got a dull, aching pain to her thoracic back. She states recent echo showed some fluid around her heart, recently started spironolactone. Denies any HA or cp, neuro intact, a&ox4.

## 2022-10-30 NOTE — ED Provider Notes (Signed)
[facility] Provider Note   Event Date/Time   First MD Initiated Contact with Patient 10/30/22 2059     (approximate) History  Hypertension and Back Pain  HPI Sarah Riddle is a 82 y.o. female with a stated past medical history of hypertension, chronic neck pain, chronic kidney disease, and chronic pain syndrome who presents for hypertension and midthoracic back pain.  Patient states that she woke up this morning with significant dizziness and took her blood pressure finding it to be 220/120.  Patient called the on-call cardiology service who recommended her take a second and third dose of losartan 25 for total of 75 mg of losartan today.  Patient states that when EMS arrived as she continued to be symptomatic, her blood pressure was 208/110.  Patient states that around noon time she began feeling a posterior midthoracic back pain that has been stable since onset and is described as 5/10, aching, nonradiating ROS: Patient currently denies any vision changes, tinnitus, difficulty speaking, facial droop, sore throat, chest pain, shortness of breath, abdominal pain, nausea/vomiting/diarrhea, dysuria, or weakness/numbness/paresthesias in any extremity   Physical Exam  Triage Vital Signs: ED Triage Vitals  Enc Vitals Group     BP 10/30/22 2008 (!) 177/101     Pulse Rate 10/30/22 2008 (!) 55     Resp 10/30/22 2008 20     Temp 10/30/22 2008 98.4 F (36.9 C)     Temp Source 10/30/22 2008 Oral     SpO2 10/30/22 2008 97 %     Weight 10/30/22 2009 193 lb (87.5 kg)     Height --      Head Circumference --      Peak Flow --      Pain Score 10/30/22 2009 5     Pain Loc --      Pain Edu? --      Excl. in [place]? --    Most recent vital signs: Vitals:   10/30/22 2230 10/30/22 2300  BP: (!) 176/95 (!) 157/115  Pulse: 92 89  Resp: 15 (!) 24  Temp:    SpO2: 97% 95%   General: Awake, oriented x4. CV:  Good peripheral perfusion.  Resp:  Normal effort.  Abd:  No  distention.  Other:  Elderly overweight Caucasian female laying in bed in no acute distress ED Results / Procedures / Treatments  Labs (all labs ordered are listed, but only abnormal results are displayed) Labs Reviewed  BASIC METABOLIC PANEL - Abnormal; Notable for the following components:      Result Value   Potassium 3.2 (*)    Glucose, Bld 107 (*)    All other components within normal limits  TROPONIN I (HIGH SENSITIVITY) - Abnormal; Notable for the following components:   Troponin I (High Sensitivity) 119 (*)    All other components within normal limits  TROPONIN I (HIGH SENSITIVITY) - Abnormal; Notable for the following components:   Troponin I (High Sensitivity) 642 (*)    All other components within normal limits  CBC   EKG ED ECG REPORT I, Naaman Plummer, the attending physician, personally viewed and interpreted this ECG. Date: 10/30/2022 EKG Time: 2012 Rate: 101 Rhythm: Tachycardic sinus rhythm QRS Axis: normal Intervals: Frequent PVCs concerning for bigeminy ST/T Wave abnormalities: normal Narrative Interpretation: Tachycardic sinus rhythm with bigeminy.  No evidence of acute ischemia RADIOLOGY ED MD interpretation: 2 view chest x-ray interpreted by me shows no evidence of acute abnormalities including no pneumonia, pneumothorax, or  widened mediastinum -Agree with radiology assessment Official radiology report(s): DG Chest 2 View  Result Date: 10/30/2022 CLINICAL DATA:  Shortness of breath and back pain EXAM: CHEST - 2 VIEW COMPARISON:  06/30/2022 FINDINGS: Stable cardiomediastinal silhouette. Aortic atherosclerotic calcification. No focal consolidation, pleural effusion, or pneumothorax. Chronic interstitial coarsening, unchanged. No acute osseous abnormality. IMPRESSION: No active cardiopulmonary disease. Stable chronic interstitial coarsening. Electronically Signed   By: Placido Sou M.D.   On: 10/30/2022 20:34   PROCEDURES: Critical Care performed: Yes, see  critical care procedure note(s) .1-3 Lead EKG Interpretation  Performed by: Naaman Plummer, MD Authorized by: Naaman Plummer, MD     Interpretation: abnormal     ECG rate:  91   ECG rate assessment: normal     Rhythm: sinus rhythm     Ectopy: none     Conduction: normal   Comments:     Bigeminy CRITICAL CARE Performed by: Naaman Plummer  Total critical care time: 35 minutes  Critical care time was exclusive of separately billable procedures and treating other patients.  Critical care was necessary to treat or prevent imminent or life-threatening deterioration.  Critical care was time spent personally by me on the following activities: development of treatment plan with patient and/or surrogate as well as nursing, discussions with consultants, evaluation of patient's response to treatment, examination of patient, obtaining history from patient or surrogate, ordering and performing treatments and interventions, ordering and review of laboratory studies, ordering and review of radiographic studies, pulse oximetry and re-evaluation of patient's condition.  MEDICATIONS ORDERED IN ED: Medications  nitroGLYCERIN 50 mg in dextrose 5 % 250 mL (0.2 mg/mL) infusion (10 mcg/min Intravenous Rate/Dose Change 10/30/22 2229)  diphenhydrAMINE (BENADRYL) capsule 50 mg (has no administration in time range)    Or  diphenhydrAMINE (BENADRYL) injection 50 mg (has no administration in time range)  methylPREDNISolone sodium succinate (SOLU-MEDROL) 40 mg/mL injection 40 mg (40 mg Intravenous Given 10/30/22 2145)   IMPRESSION / MDM / ASSESSMENT AND PLAN / ED COURSE  I reviewed the triage vital signs and the nursing notes.                             The patient is on the cardiac monitor to evaluate for evidence of arrhythmia and/or significant heart rate changes. Patient's presentation is most consistent with acute presentation with potential threat to life or bodily function.  This patient presents to  the ED for concern of back pain and hypertension, this involves an extensive number of treatment options, and is a complaint that carries with it a high risk of complications and morbidity.  The differential diagnosis includes hypertensive emergency, hypertensive urgency, aortic dissection, ACS Co morbidities that complicate the patient evaluation  Chronic back pain, hypertension Additional history obtained:  Additional history obtained from patient friend at bedside  External records from outside source obtained and reviewed including office visit from 09/16/2022 with endocrinology Lab Tests:  I Ordered, and personally interpreted labs.  The pertinent results include: Troponin 119->642 Imaging Studies ordered:  I ordered imaging studies including 2 view chest x-ray  I independently visualized and interpreted imaging which showed no evidence of acute abnormalities  I agree with the radiologist interpretation Cardiac Monitoring: / EKG:  The patient was maintained on a cardiac monitor.  I personally viewed and interpreted the cardiac monitored which showed an underlying rhythm of: Normal sinus rhythm with bigeminy Consultations Obtained:  I requested consultation with  the hospitalist,  and discussed lab and imaging findings as well as pertinent plan - they recommend: Admission Problem List / ED Course / Critical interventions / Medication management  Hypertensive emergency, elevated troponin  I ordered medication including nitroglycerin drip for hypertension  Reevaluation of the patient after these medicines showed that the patient improved  I have reviewed the patients home medicines and have made adjustments as needed Dispo: Admit to medicine       FINAL CLINICAL IMPRESSION(S) / ED DIAGNOSES   Final diagnoses:  Hypertensive emergency  Acute bilateral thoracic back pain  NSTEMI (non-ST elevated myocardial infarction) ([place])   Rx / DC Orders   ED Discharge Orders      None      Note:  This document was prepared using Dragon voice recognition software and may include unintentional dictation errors.   Naaman Plummer, MD 10/30/22 [phone]

## 2022-10-30 NOTE — H&P (Addendum)
History and Physical    Sarah Riddle OXB:[id number] DOB: 1941-07-12 DOA: 10/30/2022  PCP: Perrin Maltese, MD  Patient coming from: Home  Chief Complaint: Dizziness  HPI: Sarah Riddle is a 82 y.o. female with medical history significant of Afib s/p ablation in 2019 on Eliquis, osteoporosis, CKD, GERD, HLD, HTN, IBS, OA, OSA who presents for dizziness.  Per Sarah Riddle, she recently had been struggling with dyspnea. She went to see her cardiologist who had her complete a TTE.  At that time, she was found to have an enlarged heart and she was started on spironolactone.  She took her first dose of this medication this morning.  She noted that since that time, when she stands she will be dizzy.  She developed elevated blood pressure, checking it multiple times today.  When it did not improve by this afternoon, she called her cardiologist and was advised to take extra of her losartan (on '25mg'$  daily and she took an additional '50mg'$ ).  She also developed pain in her back, between her shoulder blades around 6pm.  This has gotten a little better in the ED with treatment of her blood pressure.  Further symptoms include increased urination with the spironolactone and 1 episode of loose stool this morning.  When she was seen by EMS, her BP was 220/120 and she was brought to the ED.  She has been started on a nitroglycerin drip.  Her troponin did start to rise and she was started on heparin.  Given the back pain, a CTA of the aorta is planned, but she is awaiting pre-treatment for an iodine allergy.   ED Course: In the ED, she was found to have an initial TnI of 119 --> 649.  EKG, reviewed on the monitor by me, did not show any acute ST elevation.  Her K was noted to be 3.2.  H/H stable.  WBC normal.  CXR did not show a widened mediastinum based on radiology reading. EDP had called cardiology but had not heard back as of yet.  BP was improved to the 683M systolic on check in the room.   Review of Systems: As  per HPI otherwise all other systems reviewed and are negative.  Past Medical History:  Diagnosis Date   A-fib [facility])    Age related osteoporosis    Cancer ([employer])    Melanoma x 2 -- Lt Leg -1976 Rt Arm - 2014   Chronic bilateral low back pain    Chronic kidney disease    Complication of anesthesia    DVT (deep venous thrombosis) ([place]) 1960   right leg  was on BC pills   Dysrhythmia    Family history of breast cancer    Family history of colon cancer    Family history of melanoma    Family history of prostate cancer    Fibrocystic breast disease    GERD (gastroesophageal reflux disease)    Headache    migraine   Heart murmur    not heard now.   History of kidney stones    Hyperlipidemia    Hypertension    IBS (irritable bowel syndrome)    Lumbago    Mitral valve prolapse    Neuropathy    Obstructive sleep apnea    Osteoarthritis of shoulder    PONV (postoperative nausea and vomiting)     Past Surgical History:  Procedure Laterality Date   ABDOMINAL HYSTERECTOMY     BACK SURGERY     BREAST  CYST ASPIRATION Left 2001   Negative   CARDIAC ELECTROPHYSIOLOGY MAPPING AND ABLATION     EYE SURGERY     HUMERUS IM NAIL Right 05/05/2022   Procedure: INTRAMEDULLARY (IM) NAIL HUMERAL;  Surgeon: Shona Needles, MD;  Location: [facility];  Service: Orthopedics;  Laterality: Right;   HYSTEROTOMY     L4-L5  2020   titaum rebok cage   TOTAL HIP ARTHROPLASTY Left     Social History  reports that she has never smoked. She has never used smokeless tobacco. She reports that she does not drink alcohol and does not use drugs.  Allergies  Allergen Reactions   Iodinated Contrast Media Shortness Of Breath    Can be premedicated   Ibuprofen Other (See Comments)    "stomach problems"    Lubiprostone Nausea And Vomiting        Sulfa Antibiotics Other (See Comments)    "Stomach problems"    Zolpidem Other (See Comments)    "hallucinations"  Other reaction(s): Confusion   Boniva  [Ibandronic Acid] Diarrhea and Nausea And Vomiting   Celecoxib Other (See Comments)    GI upset    Doxycycline Diarrhea and Nausea And Vomiting   Duloxetine Other (See Comments)    Over sedation with Zelnorm (IBS med)   Tetracyclines & Related Nausea Only and Nausea And Vomiting    Family History  Problem Relation Age of Onset   Colon cancer Mother 86   Prostate cancer Father        dx 43s   Cancer Sister    Acute myelogenous leukemia Sister        dx 57s   Prostate cancer Brother    Melanoma Brother    Prostate cancer Brother    Melanoma Brother    HIV/AIDS Brother 43   Tuberculosis Paternal Aunt    Melanoma Other    Breast cancer Other    Melanoma Niece     Prior to Admission medications   Medication Sig Start Date End Date Taking? Authorizing Provider  alendronate (FOSAMAX) 70 MG tablet Take 70 mg by mouth every Tuesday. Take with a full glass of water on an empty stomach.    [provider]  allopurinol (ZYLOPRIM) 100 MG tablet Take 100 mg by mouth daily. 06/22/17   [provider]  apixaban (ELIQUIS) 5 MG TABS tablet Take 5 mg by mouth 2 (two) times daily. 04/19/22   [provider]  Calcium Carbonate-Vitamin D (CALTRATE 600+D PO) Take 1 tablet by mouth daily.    [provider]  Cranberry 125 MG TABS Take 125 mg by mouth daily.    [provider]  ezetimibe (ZETIA) 10 MG tablet Take 10 mg by mouth daily. 08/01/17   [provider]  fluticasone (FLONASE) 50 MCG/ACT nasal spray Place 2 sprays into both nostrils daily. 07/31/17   [provider]  gabapentin (NEURONTIN) 800 MG tablet Take 800 mg by mouth in the morning, at noon, in the evening, and at bedtime.    [provider]                isosorbide mononitrate (IMDUR) 60 MG 24 hr tablet Take 60 mg by mouth daily.    [provider]  levocetirizine (XYZAL) 5 MG tablet Take 5 mg by mouth every evening. 07/31/17   [provider]   metoprolol succinate (TOPROL-XL) 25 MG 24 hr tablet Take 25 mg by mouth daily.    [provider]  nitroGLYCERIN (NITROSTAT)  0.4 MG SL tablet Place 0.4 mg under the tongue every 5 (five) minutes as needed for chest pain. 01/05/22   [provider]  ondansetron (ZOFRAN-ODT) 4 MG disintegrating tablet Take 1 tablet (4 mg total) by mouth every 8 (eight) hours as needed for nausea or vomiting. 04/28/22   Menshew, Dannielle Karvonen, PA-C  pantoprazole (PROTONIX) 40 MG tablet Take 40 mg by mouth daily. 08/22/17   [provider]  rosuvastatin (CRESTOR) 40 MG tablet Take 40 mg by mouth at bedtime.    [provider]  sertraline (ZOLOFT) 25 MG tablet Take 25 mg by mouth 3 (three) times daily.     [provider]  tetrahydrozoline-zinc (VISINE-AC) 0.05-0.25 % ophthalmic solution Place 2 drops into both eyes 3 (three) times daily as needed (dry eyes).    [provider]    Physical Exam: Vitals:   10/30/22 2350 10/31/22 0000 10/31/22 0030 10/31/22 0040  BP: (!) 168/106 (!) 179/106 (!) 186/105 (!) 185/109  Pulse: 90 90 (!) 107 (!) 101  Resp: '17 17 16 '$ (!) 22  Temp:      TempSrc:      SpO2: 96% 97% 97% 97%  Weight:        Constitutional: NAD, calm, comfortable, lying in bed Eyes: lids and conjunctivae normal ENMT: Mucous membranes are moist.  Neck: normal, supple Respiratory: Crackles in the bases, no rales, no wheezing, otherwise clear to auscultation, no chest tenderness to palpation.  Cardiovascular: Regular, at times tachycardic, no murmur.  Pulses are 1+ in the radial artery of the right, and 2+ in the radial on the left and bilateral DP.  She has LE edema, 1+ to the mid calf.  Abdomen: +BS, NT, ND Musculoskeletal: Normal tone and bulk for age Skin: Chronic skin changes on the legs related to venous changes, chronic thinning of the skin, otherwise, no acute rash Neurologic: non focal, sits up in bed without issue.  Psychiatric: Normal judgment  and insight. Alert and oriented x 3. Normal mood.    Labs on Admission: I have personally reviewed following labs and imaging studies  CBC: Recent Labs  Lab 10/30/22 2016  WBC 6.1  HGB 12.9  HCT 40.2  MCV 90.5  PLT 938    Basic Metabolic Panel: Recent Labs  Lab 10/30/22 2016  NA 140  K 3.2*  CL 103  CO2 26  GLUCOSE 107*  BUN 10  CREATININE 0.64  CALCIUM 9.7    GFR: Estimated Creatinine Clearance: 66.3 mL/min (by C-G formula based on SCr of 0.64 mg/dL).  Liver Function Tests: No results for input(s): "AST", "ALT", "ALKPHOS", "BILITOT", "PROT", "ALBUMIN" in the last 168 hours.  Urine analysis:    Component Value Date/Time   COLORURINE YELLOW (A) 02/16/2022 2117   APPEARANCEUR HAZY (A) 02/16/2022 2117   LABSPEC 1.014 02/16/2022 2117   PHURINE 5.0 02/16/2022 2117   GLUCOSEU NEGATIVE 02/16/2022 2117   HGBUR NEGATIVE 02/16/2022 2117   BILIRUBINUR NEGATIVE 02/16/2022 2117   KETONESUR NEGATIVE 02/16/2022 2117   PROTEINUR NEGATIVE 02/16/2022 2117   NITRITE NEGATIVE 02/16/2022 2117   LEUKOCYTESUR LARGE (A) 02/16/2022 2117    Radiological Exams on Admission: DG Chest 2 View  Result Date: 10/30/2022 CLINICAL DATA:  Shortness of breath and back pain EXAM: CHEST - 2 VIEW COMPARISON:  06/30/2022 FINDINGS: Stable cardiomediastinal silhouette. Aortic atherosclerotic calcification. No focal consolidation, pleural effusion, or pneumothorax. Chronic interstitial coarsening, unchanged. No acute osseous abnormality. IMPRESSION: No active cardiopulmonary disease. Stable chronic interstitial coarsening. Electronically Signed  By: Placido Sou M.D.   On: 10/30/2022 20:34    EKG: Independently reviewed. P waves present, no ST changes noted on monitor EKG.   Assessment/Plan  Accelerated hypertension Type 2 NSTEMI - CTA of the chest is pending - Nitroglycerin to assist in BP lowering.  She was noted to be 220/120 earlier today.  She would benefit from staying in the 295J -  884Z systolic for the first few hours and then slowly lowering blood pressure.   - Trend Troponin for peak - EKG as needed for chest pain - Heparin ggt - Await cardiology input for more recommendations - Considered aspirin administration, but not clear this is cardiac in origin or more related to the hypertension - Holding home antihypertensives for now, restart in the AM once CT scan complete  Chronic kidney disease - Renal function appears to be at baseline - Trend BMET    GERD (gastroesophageal reflux disease) - Continue PPI    Gout - Continue allopurinol    Sensory peripheral neuropathy - Continue gabapentin at half home dose  HLD (hyperlipidemia) - Continue high dose statin and zetia    Hypokalemia - Replace orally - Recheck    Hereditary hemochromatosis ([place]) - Last seen by oncology in November of 2023 - No need for phlebotomy at that time - OP follow up  DVT prophylaxis: Heparin ggt  Code Status:   Full, discussed with patient and daughter at bedside  Family Communication:  Daughter at bedside.  Husband is home alone, may need well check tomorrow.   Disposition Plan:   Patient is from:  Home  Anticipated DC to:  HO[facility]  Anticipated DC date:  11/02/22  Anticipated DC barriers: Worsening clinical status  Consults called:  EDP called Cardiology, if not seen, re-call in the AM  Admission status:  IP, Progressive   Severity of Illness: The appropriate patient status for this patient is INPATIENT. Inpatient status is judged to be reasonable and necessary in order to provide the required intensity of service to ensure the patient's safety. The patient's presenting symptoms, physical exam findings, and initial radiographic and laboratory data in the context of their chronic comorbidities is felt to place them at high risk for further clinical deterioration. Furthermore, it is not anticipated that the patient will be medically stable for discharge from the hospital within 2  midnights of admission.   * I certify that at the point of admission it is my clinical judgment that the patient will require inpatient hospital care spanning beyond 2 midnights from the point of admission due to high intensity of service, high risk for further deterioration and high frequency of surveillance required.Gilles Chiquito MD [facility]  How to contact the [facility] Attending or Consulting provider [place] or covering provider during after hours [place], for this patient?   Check the care team in [facility] and look for a) attending/consulting [facility] provider listed and b) the [facility] team listed Log into [email or web address] and use [place]'s universal password to access. If you do not have the password, please contact the hospital operator. Locate the [facility] provider you are looking for under [facility] and page to a number that you can be directly reached. If you still have difficulty reaching the provider, please page the [facility] (Director on Call) for the Hospitalists listed on amion for assistance.  10/31/2022, 12:54 AM

## 2022-10-30 NOTE — Progress Notes (Signed)
ANTICOAGULATION CONSULT NOTE  Pharmacy Consult for heparin infusion Indication: ACS/STEMI  Allergies  Allergen Reactions   Iodinated Contrast Media Shortness Of Breath    Can be premedicated   Ibuprofen Other (See Comments)    "stomach problems"    Lubiprostone Nausea And Vomiting        Sulfa Antibiotics Other (See Comments)    "Stomach problems"    Zolpidem Other (See Comments)    "hallucinations"  Other reaction(s): Confusion   Boniva [Ibandronic Acid] Diarrhea and Nausea And Vomiting   Celecoxib Other (See Comments)    GI upset    Doxycycline Diarrhea and Nausea And Vomiting   Duloxetine Other (See Comments)    Over sedation with Zelnorm (IBS med)   Tetracyclines & Related Nausea Only and Nausea And Vomiting    Patient Measurements: Weight: 87.5 kg (193 lb) Heparin Dosing Weight: 87.5 kg  Vital Signs: Temp: 98.4 F (36.9 C) (01/27 2008) Temp Source: Oral (01/27 2008) BP: 157/115 (01/27 2300) Pulse Rate: 89 (01/27 2300)  Labs: Recent Labs    10/30/22 2016 10/30/22 2237  HGB 12.9  --   HCT 40.2  --   PLT 195  --   CREATININE 0.64  --   TROPONINIHS 119* 642*    Estimated Creatinine Clearance: 66.3 mL/min (by C-G formula based on SCr of 0.64 mg/dL).   Medical History: Past Medical History:  Diagnosis Date   A-fib [facility])    Age related osteoporosis    Cancer ([place])    Melanoma x 2 -- Lt Leg -1976 Rt Arm - 2014   Chronic bilateral low back pain    Chronic kidney disease    Complication of anesthesia    DVT (deep venous thrombosis) ([place]) 1960   right leg  was on BC pills   Dysrhythmia    Family history of breast cancer    Family history of colon cancer    Family history of melanoma    Family history of prostate cancer    Fibrocystic breast disease    GERD (gastroesophageal reflux disease)    Headache    migraine   Heart murmur    not heard now.   History of kidney stones    Hyperlipidemia    Hypertension    IBS (irritable bowel syndrome)     Lumbago    Mitral valve prolapse    Neuropathy    Obstructive sleep apnea    Osteoarthritis of shoulder    PONV (postoperative nausea and vomiting)     Medications:  PTA Meds: Apixaban 5 mg (last dose unknown)  Assessment: Pt is a 83 yo female presenting to ED w/ hypertension & midthoracic back pain, found with elevated troponin I lvl, trending up.  Goal of Therapy:  Heparin level 0.3-0.7 units/ml aPTT 66-102 seconds Monitor platelets by anticoagulation protocol: Yes   Plan:  Bolus 4000 units x 1 Start heparin infusion at 1150 units/hr Will follow aPTT until correlation w/ HL confirmed Will check aPTT in 8 hr after start of infusion HL & CBC daily while on heparin  Renda Rolls, PharmD, [facility] 10/30/2022 11:57 PM

## 2022-10-31 ENCOUNTER — Other Ambulatory Visit: Payer: Self-pay

## 2022-10-31 ENCOUNTER — Inpatient Hospital Stay: Payer: Medicare PPO

## 2022-10-31 DIAGNOSIS — Z886 Allergy status to analgesic agent status: Secondary | ICD-10-CM | POA: Diagnosis not present

## 2022-10-31 DIAGNOSIS — I10 Essential (primary) hypertension: Secondary | ICD-10-CM | POA: Diagnosis present

## 2022-10-31 DIAGNOSIS — I21A1 Myocardial infarction type 2: Secondary | ICD-10-CM | POA: Diagnosis present

## 2022-10-31 DIAGNOSIS — M810 Age-related osteoporosis without current pathological fracture: Secondary | ICD-10-CM | POA: Diagnosis present

## 2022-10-31 DIAGNOSIS — Z888 Allergy status to other drugs, medicaments and biological substances status: Secondary | ICD-10-CM | POA: Diagnosis not present

## 2022-10-31 DIAGNOSIS — E876 Hypokalemia: Secondary | ICD-10-CM | POA: Diagnosis present

## 2022-10-31 DIAGNOSIS — Z85820 Personal history of malignant melanoma of skin: Secondary | ICD-10-CM | POA: Diagnosis not present

## 2022-10-31 DIAGNOSIS — G629 Polyneuropathy, unspecified: Secondary | ICD-10-CM | POA: Diagnosis present

## 2022-10-31 DIAGNOSIS — Z91041 Radiographic dye allergy status: Secondary | ICD-10-CM | POA: Diagnosis not present

## 2022-10-31 DIAGNOSIS — G894 Chronic pain syndrome: Secondary | ICD-10-CM | POA: Diagnosis present

## 2022-10-31 DIAGNOSIS — I161 Hypertensive emergency: Secondary | ICD-10-CM | POA: Diagnosis present

## 2022-10-31 DIAGNOSIS — Z800 Family history of malignant neoplasm of digestive organs: Secondary | ICD-10-CM | POA: Diagnosis not present

## 2022-10-31 DIAGNOSIS — Z803 Family history of malignant neoplasm of breast: Secondary | ICD-10-CM | POA: Diagnosis not present

## 2022-10-31 DIAGNOSIS — Z881 Allergy status to other antibiotic agents status: Secondary | ICD-10-CM | POA: Diagnosis not present

## 2022-10-31 DIAGNOSIS — I4891 Unspecified atrial fibrillation: Secondary | ICD-10-CM | POA: Diagnosis present

## 2022-10-31 DIAGNOSIS — Z90710 Acquired absence of both cervix and uterus: Secondary | ICD-10-CM | POA: Diagnosis not present

## 2022-10-31 DIAGNOSIS — M109 Gout, unspecified: Secondary | ICD-10-CM | POA: Diagnosis present

## 2022-10-31 DIAGNOSIS — Z86718 Personal history of other venous thrombosis and embolism: Secondary | ICD-10-CM | POA: Diagnosis not present

## 2022-10-31 DIAGNOSIS — I160 Hypertensive urgency: Secondary | ICD-10-CM | POA: Diagnosis present

## 2022-10-31 DIAGNOSIS — E785 Hyperlipidemia, unspecified: Secondary | ICD-10-CM | POA: Diagnosis present

## 2022-10-31 DIAGNOSIS — G4733 Obstructive sleep apnea (adult) (pediatric): Secondary | ICD-10-CM | POA: Diagnosis present

## 2022-10-31 DIAGNOSIS — Z882 Allergy status to sulfonamides status: Secondary | ICD-10-CM | POA: Diagnosis not present

## 2022-10-31 DIAGNOSIS — Z808 Family history of malignant neoplasm of other organs or systems: Secondary | ICD-10-CM | POA: Diagnosis not present

## 2022-10-31 DIAGNOSIS — K219 Gastro-esophageal reflux disease without esophagitis: Secondary | ICD-10-CM | POA: Diagnosis present

## 2022-10-31 DIAGNOSIS — I2510 Atherosclerotic heart disease of native coronary artery without angina pectoris: Secondary | ICD-10-CM | POA: Diagnosis not present

## 2022-10-31 HISTORY — DX: Essential (primary) hypertension: I10

## 2022-10-31 LAB — COMPREHENSIVE METABOLIC PANEL
ALT: 10 U/L (ref 0–44)
AST: 25 U/L (ref 15–41)
Albumin: 3.5 g/dL (ref 3.5–5.0)
Alkaline Phosphatase: 60 U/L (ref 38–126)
Anion gap: 4 — ABNORMAL LOW (ref 5–15)
BUN: 9 mg/dL (ref 8–23)
CO2: 26 mmol/L (ref 22–32)
Calcium: 9.1 mg/dL (ref 8.9–10.3)
Chloride: 107 mmol/L (ref 98–111)
Creatinine, Ser: 0.63 mg/dL (ref 0.44–1.00)
GFR, Estimated: >60 mL/min (ref >60)
Glucose, Bld: 177 mg/dL — ABNORMAL HIGH (ref 70–99)
Potassium: 3.3 mmol/L — ABNORMAL LOW (ref 3.5–5.1)
Sodium: 137 mmol/L (ref 135–145)
Total Bilirubin: 1 mg/dL (ref 0.3–1.2)
Total Protein: 6.1 g/dL — ABNORMAL LOW (ref 6.5–8.1)

## 2022-10-31 LAB — HEPARIN LEVEL (UNFRACTIONATED): Heparin Unfractionated: >1.1 IU/mL — ABNORMAL HIGH (ref 0.30–0.70)

## 2022-10-31 LAB — CBC
HCT: 37.2 % (ref 36.0–46.0)
Hemoglobin: 11.8 g/dL — ABNORMAL LOW (ref 12.0–15.0)
MCH: 28.6 pg (ref 26.0–34.0)
MCHC: 31.7 g/dL (ref 30.0–36.0)
MCV: 90.3 fL (ref 80.0–100.0)
Platelets: 206 10*3/uL (ref 150–400)
RBC: 4.12 MIL/uL (ref 3.87–5.11)
RDW: 14 % (ref 11.5–15.5)
WBC: 4.7 10*3/uL (ref 4.0–10.5)
nRBC: 0 % (ref 0.0–0.2)

## 2022-10-31 LAB — APTT
aPTT: 162 seconds (ref 24–36)
aPTT: 47 seconds — ABNORMAL HIGH (ref 24–36)
aPTT: 60 seconds — ABNORMAL HIGH (ref 24–36)
aPTT: >200 seconds (ref 24–36)

## 2022-10-31 LAB — PROTIME-INR
INR: 1.2 (ref 0.8–1.2)
Prothrombin Time: 14.6 seconds (ref 11.4–15.2)

## 2022-10-31 LAB — GLUCOSE, CAPILLARY
Glucose-Capillary: 130 mg/dL — ABNORMAL HIGH (ref 70–99)
Glucose-Capillary: 114 mg/dL — ABNORMAL HIGH (ref 70–99)

## 2022-10-31 LAB — TROPONIN I (HIGH SENSITIVITY)
Troponin I (High Sensitivity): 811 ng/L (ref <18)
Troponin I (High Sensitivity): 768 ng/L (ref <18)

## 2022-10-31 LAB — CBG MONITORING, ED: Glucose-Capillary: 150 mg/dL — ABNORMAL HIGH (ref 70–99)

## 2022-10-31 LAB — MRSA NEXT GEN BY PCR, NASAL: MRSA by PCR Next Gen: NOT DETECTED

## 2022-10-31 LAB — BRAIN NATRIURETIC PEPTIDE: B Natriuretic Peptide: 1191.7 pg/mL — ABNORMAL HIGH (ref 0.0–100.0)

## 2022-10-31 MED ORDER — FUROSEMIDE 10 MG/ML IJ SOLN
40.0000 mg | Freq: Once | INTRAMUSCULAR | Status: AC
  Administered 2022-10-31: 40 mg via INTRAVENOUS
  Filled 2022-10-31: qty 4

## 2022-10-31 MED ORDER — HYDRALAZINE HCL 25 MG PO TABS
25.0000 mg | ORAL_TABLET | Freq: Two times a day (BID) | ORAL | Status: DC
  Administered 2022-10-31 – 2022-11-02 (×4): 25 mg via ORAL
  Filled 2022-10-31 (×5): qty 1

## 2022-10-31 MED ORDER — POTASSIUM CHLORIDE CRYS ER 20 MEQ PO TBCR
40.0000 meq | EXTENDED_RELEASE_TABLET | Freq: Once | ORAL | Status: AC
  Administered 2022-10-31: 40 meq via ORAL
  Filled 2022-10-31: qty 2

## 2022-10-31 MED ORDER — ORAL CARE MOUTH RINSE: 15.0000 mL | OROMUCOSAL | Status: DC | PRN

## 2022-10-31 MED ORDER — IOHEXOL 350 MG/ML SOLN
75.0000 mL | Freq: Once | INTRAVENOUS | Status: AC | PRN
  Administered 2022-10-31: 75 mL via INTRAVENOUS

## 2022-10-31 MED ORDER — SPIRONOLACTONE 25 MG PO TABS
25.0000 mg | ORAL_TABLET | Freq: Every day | ORAL | Status: DC
  Administered 2022-10-31 – 2022-11-02 (×3): 25 mg via ORAL
  Filled 2022-10-31 (×3): qty 1

## 2022-10-31 MED ORDER — ONDANSETRON HCL 4 MG PO TABS: 4.0000 mg | ORAL_TABLET | Freq: Four times a day (QID) | ORAL | Status: DC | PRN

## 2022-10-31 MED ORDER — HYDROCODONE-ACETAMINOPHEN 5-325 MG PO TABS
1.0000 | ORAL_TABLET | Freq: Four times a day (QID) | ORAL | Status: DC | PRN
  Administered 2022-10-31: 1 via ORAL
  Filled 2022-10-31: qty 1

## 2022-10-31 MED ORDER — NICARDIPINE HCL IN NACL 20-0.86 MG/200ML-% IV SOLN
3.0000 mg/h | INTRAVENOUS | Status: DC
  Administered 2022-10-31: 5 mg/h via INTRAVENOUS
  Filled 2022-10-31: qty 200

## 2022-10-31 MED ORDER — ISOSORBIDE MONONITRATE ER 60 MG PO TB24
60.0000 mg | ORAL_TABLET | Freq: Every day | ORAL | Status: DC
  Administered 2022-10-31 – 2022-11-02 (×3): 60 mg via ORAL
  Filled 2022-10-31 (×2): qty 1
  Filled 2022-10-31: qty 2

## 2022-10-31 MED ORDER — ONDANSETRON HCL 4 MG/2ML IJ SOLN: 4.0000 mg | Freq: Four times a day (QID) | INTRAMUSCULAR | Status: DC | PRN

## 2022-10-31 MED ORDER — EZETIMIBE 10 MG PO TABS
10.0000 mg | ORAL_TABLET | Freq: Every day | ORAL | Status: DC
  Administered 2022-10-31 – 2022-11-01 (×2): 10 mg via ORAL
  Filled 2022-10-31 (×2): qty 1

## 2022-10-31 MED ORDER — GABAPENTIN 400 MG PO CAPS
800.0000 mg | ORAL_CAPSULE | Freq: Three times a day (TID) | ORAL | Status: DC
  Administered 2022-10-31 – 2022-11-01 (×4): 800 mg via ORAL
  Filled 2022-10-31 (×5): qty 2

## 2022-10-31 MED ORDER — CHLORHEXIDINE GLUCONATE CLOTH 2 % EX PADS
6.0000 | MEDICATED_PAD | Freq: Every day | CUTANEOUS | Status: DC
  Administered 2022-11-01 – 2022-11-02 (×2): 6 via TOPICAL

## 2022-10-31 MED ORDER — METOPROLOL SUCCINATE ER 25 MG PO TB24
25.0000 mg | ORAL_TABLET | Freq: Every day | ORAL | Status: DC
  Administered 2022-10-31 – 2022-11-02 (×3): 25 mg via ORAL
  Filled 2022-10-31 (×3): qty 1

## 2022-10-31 MED ORDER — OXYCODONE HCL 5 MG PO TABS: 5.0000 mg | ORAL_TABLET | ORAL | Status: DC | PRN

## 2022-10-31 MED ORDER — SODIUM CHLORIDE 0.9% FLUSH
3.0000 mL | Freq: Two times a day (BID) | INTRAVENOUS | Status: DC
  Administered 2022-10-31 (×2): 3 mL via INTRAVENOUS

## 2022-10-31 MED ORDER — DIPHENHYDRAMINE HCL 25 MG PO CAPS
50.0000 mg | ORAL_CAPSULE | Freq: Once | ORAL | Status: DC
  Filled 2022-10-31: qty 2

## 2022-10-31 MED ORDER — INSULIN ASPART 100 UNIT/ML IJ SOLN
0.0000 [IU] | Freq: Three times a day (TID) | INTRAMUSCULAR | Status: DC
  Administered 2022-10-31: 2 [IU] via SUBCUTANEOUS
  Filled 2022-10-31: qty 1

## 2022-10-31 MED ORDER — ROSUVASTATIN CALCIUM 10 MG PO TABS
40.0000 mg | ORAL_TABLET | Freq: Every day | ORAL | Status: DC
  Administered 2022-10-31 – 2022-11-01 (×2): 40 mg via ORAL
  Filled 2022-10-31 (×2): qty 4

## 2022-10-31 MED ORDER — HEPARIN (PORCINE) 25000 UT/250ML-% IV SOLN
950.0000 [IU]/h | INTRAVENOUS | Status: DC
  Administered 2022-10-31 (×2): 950 [IU]/h via INTRAVENOUS
  Filled 2022-10-31: qty 250

## 2022-10-31 MED ORDER — PREDNISONE 10 MG PO TABS
50.0000 mg | ORAL_TABLET | Freq: Four times a day (QID) | ORAL | Status: AC
  Administered 2022-10-31 – 2022-11-01 (×3): 50 mg via ORAL
  Filled 2022-10-31 (×3): qty 5

## 2022-10-31 MED ORDER — ACETAMINOPHEN 325 MG PO TABS
650.0000 mg | ORAL_TABLET | Freq: Four times a day (QID) | ORAL | Status: DC | PRN
  Administered 2022-10-31: 650 mg via ORAL
  Filled 2022-10-31: qty 2

## 2022-10-31 MED ORDER — PREDNISONE 20 MG PO TABS
50.0000 mg | ORAL_TABLET | Freq: Four times a day (QID) | ORAL | Status: DC
  Filled 2022-10-31: qty 1

## 2022-10-31 MED ORDER — DIPHENHYDRAMINE HCL 50 MG PO CAPS: 50.0000 mg | ORAL_CAPSULE | Freq: Once | ORAL | Status: DC

## 2022-10-31 MED ORDER — ACETAMINOPHEN 650 MG RE SUPP: 650.0000 mg | Freq: Four times a day (QID) | RECTAL | Status: DC | PRN

## 2022-10-31 MED ORDER — ALLOPURINOL 100 MG PO TABS
100.0000 mg | ORAL_TABLET | Freq: Every day | ORAL | Status: DC
  Administered 2022-10-31 – 2022-11-02 (×3): 100 mg via ORAL
  Filled 2022-10-31 (×3): qty 1

## 2022-10-31 MED ORDER — PANTOPRAZOLE SODIUM 40 MG PO TBEC
40.0000 mg | DELAYED_RELEASE_TABLET | Freq: Every day | ORAL | Status: DC
  Administered 2022-10-31 – 2022-11-02 (×3): 40 mg via ORAL
  Filled 2022-10-31 (×3): qty 1

## 2022-10-31 MED ORDER — LORATADINE 10 MG PO TABS
10.0000 mg | ORAL_TABLET | Freq: Every evening | ORAL | Status: DC
  Administered 2022-10-31 – 2022-11-01 (×2): 10 mg via ORAL
  Filled 2022-10-31 (×2): qty 1

## 2022-10-31 MED ORDER — LOSARTAN POTASSIUM 25 MG PO TABS
25.0000 mg | ORAL_TABLET | Freq: Two times a day (BID) | ORAL | Status: DC
  Administered 2022-10-31 – 2022-11-02 (×5): 25 mg via ORAL
  Filled 2022-10-31 (×5): qty 1

## 2022-10-31 MED ORDER — FLUTICASONE PROPIONATE 50 MCG/ACT NA SUSP: 2.0000 | Freq: Every day | NASAL | Status: DC | PRN

## 2022-10-31 MED ORDER — ONDANSETRON 4 MG PO TBDP: 4.0000 mg | ORAL_TABLET | Freq: Three times a day (TID) | ORAL | Status: DC | PRN

## 2022-10-31 MED ORDER — FAMOTIDINE IN NACL 20-0.9 MG/50ML-% IV SOLN
20.0000 mg | Freq: Two times a day (BID) | INTRAVENOUS | Status: AC
  Administered 2022-10-31: 20 mg via INTRAVENOUS
  Filled 2022-10-31 (×2): qty 50

## 2022-10-31 MED ORDER — INSULIN ASPART 100 UNIT/ML IJ SOLN: 0.0000 [IU] | Freq: Every day | INTRAMUSCULAR | Status: DC

## 2022-10-31 MED ORDER — NITROGLYCERIN 0.4 MG SL SUBL: 0.4000 mg | SUBLINGUAL_TABLET | SUBLINGUAL | Status: DC | PRN

## 2022-10-31 NOTE — Assessment & Plan Note (Signed)
Near baseline. Monitor BMP.

## 2022-10-31 NOTE — Consult Note (Signed)
CARDIOLOGY CONSULT NOTE               Patient ID: Sarah Riddle MRN: [id number] DOB/AGE: Jun 09, 1941 82 y.o.  Admit date: 10/30/2022 Referring Physician Dr. Gilles Chiquito hospitalist Primary Physician Dr. Lamonte Sakai primary Primary Cardiologist Dr. Neoma Laming radiologist Reason for Consultation hypertensive urgency possible non-STEMI elevated troponins  HPI: Patient is a 82 year old female followed by Dr. Humphrey Rolls history of multiple medical problems who recently had issues with her blood pressure with systolics above 093 she has a history of A-fib status post ablation still on Eliquis she has chronic renal sufficiency GERD hypertension hyperlipidemia obstructive sleep apnea mild obesity complains of dizziness lightheadedness elevated blood pressure took extra medications at home without improvement so called rescue we then brought her to the emergency room she was treated in ER to bring her blood pressure down and improve her symptoms unfortunately her troponin increased up to 650 with nonspecific EKG changes the patient was admitted for further evaluation and management and placed on heparin she complained of mid scapular back pain but no chest pain no significant shortness of breath.  Patient had a CTA done which showed stable aorta no evidence of pulm embolus  Review of systems complete and found to be negative unless listed above     Past Medical History:  Diagnosis Date   A-fib [facility])    Age related osteoporosis    Cancer ([place])    Melanoma x 2 -- Lt Leg -1976 Rt Arm - 2014   Chronic bilateral low back pain    Chronic kidney disease    Complication of anesthesia    DVT (deep venous thrombosis) ([name]) 1960   right leg  was on BC pills   Dysrhythmia    Family history of breast cancer    Family history of colon cancer    Family history of melanoma    Family history of prostate cancer    Fibrocystic breast disease    GERD (gastroesophageal reflux disease)    Headache     migraine   Heart murmur    not heard now.   History of kidney stones    Hyperlipidemia    Hypertension    IBS (irritable bowel syndrome)    Lumbago    Mitral valve prolapse    Neuropathy    Obstructive sleep apnea    Osteoarthritis of shoulder    PONV (postoperative nausea and vomiting)     Past Surgical History:  Procedure Laterality Date   ABDOMINAL HYSTERECTOMY     BACK SURGERY     BREAST CYST ASPIRATION Left 2001   Negative   CARDIAC ELECTROPHYSIOLOGY MAPPING AND ABLATION     EYE SURGERY     HUMERUS IM NAIL Right 05/05/2022   Procedure: INTRAMEDULLARY (IM) NAIL HUMERAL;  Surgeon: Shona Needles, MD;  Location: [facility];  Service: Orthopedics;  Laterality: Right;   HYSTEROTOMY     L4-L5  2020   titaum rebok cage   TOTAL HIP ARTHROPLASTY Left     (Not in a hospital admission)  Social History   Socioeconomic History   Marital status: Married    Spouse name: Not on file   Number of children: Not on file   Years of education: Not on file   Highest education level: Not on file  Occupational History   Not on file  Tobacco Use   Smoking status: Never   Smokeless tobacco: Never  Vaping Use   Vaping Use: Never used  Substance and Sexual Activity   Alcohol use: No   Drug use: No   Sexual activity: Not on file  Other Topics Concern   Not on file  Social History Narrative   Not on file   Social Determinants of Health   Financial Resource Strain: Not on file  Food Insecurity: Not on file  Transportation Needs: Not on file  Physical Activity: Not on file  Stress: Not on file  Social Connections: Not on file  Intimate Partner Violence: Not on file    Family History  Problem Relation Age of Onset   Colon cancer Mother 79   Prostate cancer Father        dx 46s   Cancer Sister    Acute myelogenous leukemia Sister        dx 36s   Prostate cancer Brother    Melanoma Brother    Prostate cancer Brother    Melanoma Brother    HIV/AIDS Brother 45   Tuberculosis  Paternal Aunt    Melanoma Other    Breast cancer Other    Melanoma Niece       Review of systems complete and found to be negative unless listed above      PHYSICAL EXAM  General: Well developed, well nourished, in no acute distress HEENT:  Normocephalic and atramatic Neck:  No JVD.  Lungs: Clear bilaterally to auscultation and percussion. Heart: HRRR . Normal S1 and S2 without gallops or murmurs.  Abdomen: Bowel sounds are positive, abdomen soft and non-tender  Msk:  Back normal, normal gait. Normal strength and tone for age. Extremities: No clubbing, cyanosis or edema.   Neuro: Alert and oriented X 3. Psych:  Good affect, responds appropriately  Labs:   Lab Results  Component Value Date   WBC 4.7 10/31/2022   HGB 11.8 (L) 10/31/2022   HCT 37.2 10/31/2022   MCV 90.3 10/31/2022   PLT 206 10/31/2022    Recent Labs  Lab 10/31/22 0542  NA 137  K 3.3*  CL 107  CO2 26  BUN 9  CREATININE 0.63  CALCIUM 9.1  PROT 6.1*  BILITOT 1.0  ALKPHOS 60  ALT 10  AST 25  GLUCOSE 177*   Lab Results  Component Value Date   TROPONINI 0.03 ([place]) 08/25/2017   No results found for: "CHOL" No results found for: "HDL" No results found for: "LDLCALC" No results found for: "TRIG" No results found for: "CHOLHDL" No results found for: "LDLDIRECT"    Radiology: CT Angio Chest PE W/Cm &/Or Wo Cm  Result Date: 10/31/2022 CLINICAL DATA:  Dizziness and elevated blood pressure.  PE suspected EXAM: CT ANGIOGRAPHY CHEST WITH CONTRAST TECHNIQUE: Multidetector CT imaging of the chest was performed using the standard protocol during bolus administration of intravenous contrast. Multiplanar CT image reconstructions and MIPs were obtained to evaluate the vascular anatomy. RADIATION DOSE REDUCTION: This exam was performed according to the departmental dose-optimization program which includes automated exposure control, adjustment of the mA and/or kV according to patient size and/or use of iterative  reconstruction technique. CONTRAST:  54m OMNIPAQUE IOHEXOL 350 MG/ML SOLN COMPARISON:  Radiograph 10/30/2022 and CT chest 08/03/2021 FINDINGS: Cardiovascular: Satisfactory opacification of the pulmonary arteries to the segmental level. No evidence of pulmonary embolism. Cardiomegaly. Small pericardial effusion. Mitral annular and aortic valve calcification. Coronary artery and aortic atherosclerotic calcification. Mediastinum/Nodes: No enlarged mediastinal, hilar, or axillary lymph nodes. Thyroid gland, trachea, and esophagus demonstrate no significant findings. Lungs/Pleura: Patchy ground-glass opacities and interlobular septal thickening.  Moderate right and small left pleural effusions. Mild bronchial wall thickening. Stable 7 x 5 mm subpleural left lingular pulmonary nodule (5/114). This is unchanged from 08/03/2021. Upper Abdomen: No acute abnormality. Musculoskeletal: No chest wall abnormality. No acute fracture. Review of the MIP images confirms the above findings. IMPRESSION: 1. Negative for acute pulmonary embolism. 2. Findings favored to represent CHF with moderate right and small left pleural effusions. Atypical infection is less likely but not excluded. 3. Coronary artery atherosclerosis. Aortic Atherosclerosis (ICD10-I70.0). Electronically Signed   By: Placido Sou M.D.   On: 10/31/2022 02:05   DG Chest 2 View  Result Date: 10/30/2022 CLINICAL DATA:  Shortness of breath and back pain EXAM: CHEST - 2 VIEW COMPARISON:  06/30/2022 FINDINGS: Stable cardiomediastinal silhouette. Aortic atherosclerotic calcification. No focal consolidation, pleural effusion, or pneumothorax. Chronic interstitial coarsening, unchanged. No acute osseous abnormality. IMPRESSION: No active cardiopulmonary disease. Stable chronic interstitial coarsening. Electronically Signed   By: Placido Sou M.D.   On: 10/30/2022 20:34    EKG: Normal sinus rhythm evidence of LVH nonspecific ST-T wave changes rate of 75  ASSESSMENT  AND PLAN:  Possible acute coronary syndrome Elevated troponins Hypertensive urgency History of A-fib status post ablation Chronic renal insufficiency GERD Hyperlipidemia Obstructive sleep apnea . Plan Continue telemetry follow-up EKGs and troponins Maintain anticoagulation with heparin in place of Eliquis in case of any urgent procedures like catheterization Premed for contrast allergy preop for cardiac cath Because of elevated troponins which may represent non-STEMI versus demand ischemia will consider cardiac cath tomorrow with Dr. Humphrey Rolls Continue aggressive hypertension management and control Echocardiogram for assessment left ventricular function wall motion valvular structures  Signed: Yolonda Kida MD, 10/31/2022, 11:13 AM

## 2022-10-31 NOTE — Assessment & Plan Note (Signed)
K replaced on admission. Monitor BMP, replace K PRN

## 2022-10-31 NOTE — Progress Notes (Signed)
Progress Note   Patient: Sarah Riddle OZH:[id number] DOB: 05/28/1941 DOA: 10/30/2022     0 DOS: the patient was seen and examined on 10/31/2022   Brief hospital course: Sarah Riddle is a 82 y.o. female with medical history significant of Afib s/p ablation in 2019 on Eliquis, osteoporosis, CKD, GERD, HLD, HTN, IBS, OA, OSA who presented on evening of 10/30/2022 for evaluation of dizziness and upper back pain.  She reported dyspnea recently.  Seen by Dr. Humphrey Rolls, cardiologist for Echo and was started on spironolactone.  She developed dizziness after taking it for the first time day of admission.  She began monitoring her BP at home and noted it was quite high.  She called cardiology earlier and was told to take extra dose losartan.  When BP continued to rise, EMS was called and BP with them was 220/120.  She also reported pain in her back, between her shoulder blades around 6pm.  This has gotten a little better in the ED with treatment of her blood pressure.  She was started in Nitro drip in the ED since troponin was elevated and trended up. She did not have any chest pain or ischemic EKG changes.  Was empirically started on heparin and cardiology consulted.  Admitted to medicine service.  A CTA chest/abdomen/pelvis was planned to rule out aortic dissection, but patient ended up having only CTA chest PE protocol (for unclear reasons).  CTA was negative for PE.  1/28: resumed home BP meds, remained on Nitro drip at max dose. Cardene drip was started and patient admitted to [facility] unit.    Her back pain is improved and she has equal symmetric radial pulses.  She was agreeable to close monitoring as opposed to repeating pre-treatment again for IV contrast CT scan.  Assessment and Plan: * Accelerated hypertension Continue Nitro drip. Had to add Cardene drip this afternoon. Resumed on home losartan, metoprolol, hydralazine, spironolactone. Will give IV Lasix 40 mg x 1 as well. Monitor BP  closely.  Sensory peripheral neuropathy Continue gabapentin  Hypertensive urgency See accelerated hypertension  Hereditary hemochromatosis ([place]) Last seen by oncology in November of 2023.  No need for phlebotomy at that time Continue outpatient follow up.  Hypokalemia K replaced on admission. Monitor BMP, replace K PRN  HLD (hyperlipidemia) Continue statin and Zetia  HTN (hypertension) See accelerated hypertension  Gout Continue allopurinol. No acute flare sx's reported.  GERD (gastroesophageal reflux disease) Continue PPI  Chronic kidney disease Near baseline. Monitor BMP.        Subjective: Pt seen in ED this AM, holding for a bed.  Her upper back pain has improved, not fully resolved. She says it feels like a dull ache, has never been sharp, ripping/tearing in nature.  Her radial pulses are strong and symmetric.  No chest pain or dyspnea at rest but recently more dyspneic with exertion than usual.   Dizziness improved.    Physical Exam: Vitals:   10/31/22 1458 10/31/22 1500 10/31/22 1530 10/31/22 1600  BP: (!) 163/101 (!) 161/91 116/81 (!) 140/85  Pulse:  (!) 101 (!) 121 89  Resp: (!) 21 (!) 29 (!) 23 16  Temp: 98.7 F (37.1 C)     TempSrc: Oral     SpO2: 95% 97% 94% 96%  Weight: 86.5 kg     Height:       General exam: awake, alert, no acute distress HEENT: atraumatic, clear conjunctiva, anicteric sclera, moist mucus membranes, hearing grossly normal  Respiratory system: CTAB  diminished bases with faint bibasilar crackles, no wheezes, normal respiratory effort. Cardiovascular system: normal S1/S2, RRR, trace BLE edema.  Radial pulses 2+ bilaterally and symmetric. Gastrointestinal system: soft, NT, ND, no HSM felt, +bowel sounds. Central nervous system: A&O x4. no gross focal neurologic deficits, normal speech Extremities: moves all, no edema, normal tone Skin: dry, intact, normal temperature, normal color, No rashes, lesions or ulcers Psychiatry: normal  mood, congruent affect, judgement and insight appear normal   Data Reviewed:  Notable labs --  K 3.3, glucose 177, gap 4, Troponin trend 119 >> 642 >> 811 >> 768. BNP 1191.7 Hbg 11.8 from 12.9  Family Communication: None present, will attempt to call  Disposition: Status is: Inpatient Remains inpatient appropriate because: BP remains uncontrolled, on IV therapies as above   Planned Discharge Destination: Home    Time spent: 55 minutes including time at bedside and in coordination of care  Author: Ezekiel Slocumb, DO 10/31/2022 5:14 PM  For on call review [email or web address].

## 2022-10-31 NOTE — Assessment & Plan Note (Signed)
Last seen by oncology in November of 2023.  No need for phlebotomy at that time Continue outpatient follow up.

## 2022-10-31 NOTE — Progress Notes (Signed)
ANTICOAGULATION CONSULT NOTE  Pharmacy Consult for heparin infusion Indication: ACS/STEMI  Allergies  Allergen Reactions   Iodinated Contrast Media Shortness Of Breath    Can be premedicated   Ibuprofen Other (See Comments)    "stomach problems"    Lubiprostone Nausea And Vomiting        Sulfa Antibiotics Other (See Comments)    "Stomach problems"    Zolpidem Other (See Comments)    "hallucinations"  Other reaction(s): Confusion  Other Reaction(s): confusion   Boniva [Ibandronic Acid] Diarrhea and Nausea And Vomiting   Celecoxib Other (See Comments)    GI upset    Doxycycline Diarrhea and Nausea And Vomiting   Duloxetine Other (See Comments)    Over sedation with Zelnorm (IBS med)   Tetracyclines & Related Nausea Only and Nausea And Vomiting    Patient Measurements: Weight: 87.5 kg (193 lb) Heparin Dosing Weight: 87.5 kg  Vital Signs: Temp: 98.2 F (36.8 C) (01/28 0446) Temp Source: Oral (01/28 0446) BP: 169/95 (01/28 1250) Pulse Rate: 100 (01/28 1250)  Labs: Recent Labs    10/30/22 2016 10/30/22 2237 10/31/22 0012 10/31/22 0542 10/31/22 0721 10/31/22 0846 10/31/22 1118  HGB 12.9  --   --  11.8*  --   --   --   HCT 40.2  --   --  37.2  --   --   --   PLT 195  --   --  206  --   --   --   APTT  --   --  47*  --   --  162* >200*  LABPROT  --   --  14.6  --   --   --   --   INR  --   --  1.2  --   --   --   --   HEPARINUNFRC  --   --  >1.10*  --   --   --   --   CREATININE 0.64  --   --  0.63  --   --   --   TROPONINIHS 119* 642*  --  811* 768*  --   --      Estimated Creatinine Clearance: 66.3 mL/min (by C-G formula based on SCr of 0.63 mg/dL).   Medical History: Past Medical History:  Diagnosis Date   A-fib [facility])    Age related osteoporosis    Cancer ([place])    Melanoma x 2 -- Lt Leg -1976 Rt Arm - 2014   Chronic bilateral low back pain    Chronic kidney disease    Complication of anesthesia    DVT (deep venous thrombosis) ([place]) 1960    right leg  was on BC pills   Dysrhythmia    Family history of breast cancer    Family history of colon cancer    Family history of melanoma    Family history of prostate cancer    Fibrocystic breast disease    GERD (gastroesophageal reflux disease)    Headache    migraine   Heart murmur    not heard now.   History of kidney stones    Hyperlipidemia    Hypertension    IBS (irritable bowel syndrome)    Lumbago    Mitral valve prolapse    Neuropathy    Obstructive sleep apnea    Osteoarthritis of shoulder    PONV (postoperative nausea and vomiting)     Medications:  PTA Meds: Apixaban 5 mg (last dose 1/27)  Assessment: Pt is a 82 yo female presenting to ED w/ hypertension & midthoracic back pain, found with elevated troponin I lvl, trending up. 1/28 0012  HL>1.10,  aPTT 47, INR 1.2    1/28 0846 aPTT=162   will recheck  1/28 1118 aPTT >200   Hold drip x 1 hour and resume at 950 u/hr   Goal of Therapy:  Heparin level 0.3-0.7 units/ml aPTT 66-102 seconds Monitor platelets by anticoagulation protocol: Yes   Plan:  1/28 1118 aPTT >200  Supratherapeutic  Hold drip x 1 hour and then resume heparin infusion at 950 units/hr Will follow aPTT until correlation w/ HL confirmed Will check aPTT in 6 hr (a little early)after restart of infusion HL & CBC daily while on heparin  Chinita Greenland PharmD Clinical Pharmacist 10/31/2022

## 2022-10-31 NOTE — Assessment & Plan Note (Addendum)
Hypertensive Urgency Initially on Nitro drip, BP's remained elevated.  We added Cardene drip and admitted to stepdown unit. Resumed on home medications - losartan, metoprolol, hydralazine, spironolactone. Given single dose IV Lasix. Suspect this is driven by significant life stressors. BP's today controlled and normal on usual home regimen.

## 2022-10-31 NOTE — Assessment & Plan Note (Signed)
See accelerated hypertension

## 2022-10-31 NOTE — Progress Notes (Signed)
ANTICOAGULATION CONSULT NOTE  Pharmacy Consult for heparin infusion Indication: ACS/STEMI  Allergies  Allergen Reactions   Iodinated Contrast Media Shortness Of Breath    Can be premedicated   Ibuprofen Other (See Comments)    "stomach problems"    Lubiprostone Nausea And Vomiting        Sulfa Antibiotics Other (See Comments)    "Stomach problems"    Zolpidem Other (See Comments)    "hallucinations"  Other reaction(s): Confusion  Other Reaction(s): confusion   Boniva [Ibandronic Acid] Diarrhea and Nausea And Vomiting   Celecoxib Other (See Comments)    GI upset    Doxycycline Diarrhea and Nausea And Vomiting   Duloxetine Other (See Comments)    Over sedation with Zelnorm (IBS med)   Tetracyclines & Related Nausea Only and Nausea And Vomiting    Patient Measurements: Height: '5\' 11"'$  (180.3 cm) Weight: 86.5 kg (190 lb 11.2 oz) IBW/kg (Calculated) : 70.8 Heparin Dosing Weight: 87.5 kg  Vital Signs: Temp: 98.7 F (37.1 C) (01/28 1458) Temp Source: Oral (01/28 1458) BP: 130/74 (01/28 1945) Pulse Rate: 78 (01/28 1945)  Labs: Recent Labs    10/30/22 2016 10/30/22 2237 10/31/22 0012 10/31/22 0012 10/31/22 0542 10/31/22 0721 10/31/22 0846 10/31/22 1118 10/31/22 2017  HGB 12.9  --   --   --  11.8*  --   --   --   --   HCT 40.2  --   --   --  37.2  --   --   --   --   PLT 195  --   --   --  206  --   --   --   --   APTT  --   --  47*   < >  --   --  162* >200* 60*  LABPROT  --   --  14.6  --   --   --   --   --   --   INR  --   --  1.2  --   --   --   --   --   --   HEPARINUNFRC  --   --  >1.10*  --   --   --   --   --   --   CREATININE 0.64  --   --   --  0.63  --   --   --   --   TROPONINIHS 119* 642*  --   --  811* 768*  --   --   --    < > = values in this interval not displayed.     Estimated Creatinine Clearance: 66 mL/min (by C-G formula based on SCr of 0.63 mg/dL).   Medical History: Past Medical History:  Diagnosis Date   A-fib [facility])    Age  related osteoporosis    Cancer ([place])    Melanoma x 2 -- Lt Leg -1976 Rt Arm - 2014   Chronic bilateral low back pain    Chronic kidney disease    Complication of anesthesia    DVT (deep venous thrombosis) ([place]) 1960   right leg  was on BC pills   Dysrhythmia    Family history of breast cancer    Family history of colon cancer    Family history of melanoma    Family history of prostate cancer    Fibrocystic breast disease    GERD (gastroesophageal reflux disease)    Headache    migraine  Heart murmur    not heard now.   History of kidney stones    Hyperlipidemia    Hypertension    IBS (irritable bowel syndrome)    Lumbago    Mitral valve prolapse    Neuropathy    Obstructive sleep apnea    Osteoarthritis of shoulder    PONV (postoperative nausea and vomiting)     Medications:  PTA Meds: Apixaban 5 mg (last dose 1/27)  Assessment: Pt is a 82 yo female presenting to ED w/ hypertension & midthoracic back pain, found with elevated troponin I lvl, trending up. 1/28 0012  HL>1.10,  aPTT 47, INR 1.2    1/28 0846 aPTT=162   will recheck  1/28 1118 aPTT >200   Hold drip x 1 hour and resume at 950 u/hr 1/28 2017 aPTT = 60, subtherapeutic @ 950 un/hr  Goal of Therapy:  Heparin level 0.3-0.7 units/ml aPTT 66-102 seconds Monitor platelets by anticoagulation protocol: Yes   Plan: aPTT is subtherapeutic --Will not give a bolus since patient was previously supratherapeutic on two draws --Increase heparin drip to 1050 units/hr --Will follow aPTT until correlation w/ HL confirmed --Will check aPTT in 8 hr after rate change --HL & CBC daily while on heparin  Tollie Eth, PharmD Clinical Pharmacist 10/31/2022

## 2022-10-31 NOTE — Assessment & Plan Note (Signed)
Continue allopurinol. No acute flare sx's reported.

## 2022-10-31 NOTE — Assessment & Plan Note (Signed)
Continue gabapentin.

## 2022-10-31 NOTE — Assessment & Plan Note (Signed)
Continue statin and Zetia

## 2022-10-31 NOTE — ED Notes (Signed)
Advised nurse that patient has ready bed 

## 2022-10-31 NOTE — ED Notes (Signed)
Patient transported to CT 

## 2022-10-31 NOTE — Assessment & Plan Note (Signed)
Continue PPI ?

## 2022-10-31 NOTE — Hospital Course (Signed)
Sarah Riddle is a 82 y.o. female with medical history significant of Afib s/p ablation in 2019 on Eliquis, osteoporosis, CKD, GERD, HLD, HTN, IBS, OA, OSA who presented on evening of 10/30/2022 for evaluation of dizziness and upper back pain.  She reported dyspnea recently.  Seen by Dr. Humphrey Rolls, cardiologist for Echo and was started on spironolactone.  She developed dizziness after taking it for the first time day of admission.  She began monitoring her BP at home and noted it was quite high.  She called cardiology earlier and was told to take extra dose losartan.  When BP continued to rise, EMS was called and BP with them was 220/120.  She also reported pain in her back, between her shoulder blades around 6pm.  This has gotten a little better in the ED with treatment of her blood pressure.  She was started in Nitro drip in the ED since troponin was elevated and trended up. She did not have any chest pain or ischemic EKG changes.  Was empirically started on heparin and cardiology consulted.  Admitted to medicine service.  A CTA chest/abdomen/pelvis was planned to rule out aortic dissection, but patient ended up having only CTA chest PE protocol (for unclear reasons).  CTA was negative for PE.  1/28: resumed home BP meds, remained on Nitro drip at max dose. Cardene drip was started and patient admitted to [facility] unit.    Her back pain is improved and she has equal symmetric radial pulses.  She was agreeable to close monitoring as opposed to repeating pre-treatment again for IV contrast CT scan.

## 2022-10-31 NOTE — Progress Notes (Signed)
[place] for heparin infusion Indication: ACS/STEMI  Allergies  Allergen Reactions   Iodinated Contrast Media Shortness Of Breath    Can be premedicated   Ibuprofen Other (See Comments)    "stomach problems"    Lubiprostone Nausea And Vomiting        Sulfa Antibiotics Other (See Comments)    "Stomach problems"    Zolpidem Other (See Comments)    "hallucinations"  Other reaction(s): Confusion  Other Reaction(s): confusion   Boniva [Ibandronic Acid] Diarrhea and Nausea And Vomiting   Celecoxib Other (See Comments)    GI upset    Doxycycline Diarrhea and Nausea And Vomiting   Duloxetine Other (See Comments)    Over sedation with Zelnorm (IBS med)   Tetracyclines & Related Nausea Only and Nausea And Vomiting    Patient Measurements: Weight: 87.5 kg (193 lb) Heparin Dosing Weight: 87.5 kg  Vital Signs: Temp: 98.2 F (36.8 C) (01/28 0446) Temp Source: Oral (01/28 0446) BP: 153/88 (01/28 1110) Pulse Rate: 90 (01/28 1110)  Labs: Recent Labs    10/30/22 2016 10/30/22 2237 10/31/22 0012 10/31/22 0542 10/31/22 0721 10/31/22 0846  HGB 12.9  --   --  11.8*  --   --   HCT 40.2  --   --  37.2  --   --   PLT 195  --   --  206  --   --   APTT  --   --  47*  --   --  162*  LABPROT  --   --  14.6  --   --   --   INR  --   --  1.2  --   --   --   HEPARINUNFRC  --   --  >1.10*  --   --   --   CREATININE 0.64  --   --  0.63  --   --   TROPONINIHS 119* 642*  --  811* 768*  --      Estimated Creatinine Clearance: 66.3 mL/min (by C-G formula based on SCr of 0.63 mg/dL).   Medical History: Past Medical History:  Diagnosis Date   A-fib [facility])    Age related osteoporosis    Cancer ([place])    Melanoma x 2 -- Lt Leg -1976 Rt Arm - 2014   Chronic bilateral low back pain    Chronic kidney disease    Complication of anesthesia    DVT (deep venous thrombosis) ([place]) 1960   right leg  was on BC pills   Dysrhythmia    Family history of  breast cancer    Family history of colon cancer    Family history of melanoma    Family history of prostate cancer    Fibrocystic breast disease    GERD (gastroesophageal reflux disease)    Headache    migraine   Heart murmur    not heard now.   History of kidney stones    Hyperlipidemia    Hypertension    IBS (irritable bowel syndrome)    Lumbago    Mitral valve prolapse    Neuropathy    Obstructive sleep apnea    Osteoarthritis of shoulder    PONV (postoperative nausea and vomiting)     Medications:  PTA Meds: Apixaban 5 mg (last dose 1/27)  Assessment: Pt is a 82 yo female presenting to ED w/ hypertension & midthoracic back pain, found with elevated troponin I lvl, trending up. 1/28 0012  HL>1.10,  aPTT 47, INR 1.2    1/28 0846 aPTT=162   will recheck    Goal of Therapy:  Heparin level 0.3-0.7 units/ml aPTT 66-102 seconds Monitor platelets by anticoagulation protocol: Yes   Plan:  Bolus 4000 units x 1 Start heparin infusion at 1150 units/hr Will follow aPTT until correlation w/ HL confirmed Will check aPTT in 8 hr after start of infusion HL & CBC daily while on heparin  Chinita Greenland PharmD Clinical Pharmacist 10/31/2022

## 2022-11-01 ENCOUNTER — Encounter
Admission: EM | Disposition: A | Discharge status: Home / Self Care | Payer: Self-pay | Source: Home / Self Care | Attending: Internal Medicine

## 2022-11-01 ENCOUNTER — Encounter: Payer: Self-pay | Admitting: Cardiovascular Disease

## 2022-11-01 DIAGNOSIS — R7989 Other specified abnormal findings of blood chemistry: Secondary | ICD-10-CM | POA: Diagnosis present

## 2022-11-01 DIAGNOSIS — I10 Essential (primary) hypertension: Secondary | ICD-10-CM | POA: Diagnosis not present

## 2022-11-01 HISTORY — PX: LEFT HEART CATH AND CORONARY ANGIOGRAPHY: CATH118249

## 2022-11-01 LAB — GLUCOSE, CAPILLARY
Glucose-Capillary: 117 mg/dL — ABNORMAL HIGH (ref 70–99)
Glucose-Capillary: 213 mg/dL — ABNORMAL HIGH (ref 70–99)
Glucose-Capillary: 130 mg/dL — ABNORMAL HIGH (ref 70–99)
Glucose-Capillary: 106 mg/dL — ABNORMAL HIGH (ref 70–99)

## 2022-11-01 LAB — BASIC METABOLIC PANEL
Anion gap: 11 (ref 5–15)
BUN: 13 mg/dL (ref 8–23)
CO2: 27 mmol/L (ref 22–32)
Calcium: 9.6 mg/dL (ref 8.9–10.3)
Chloride: 102 mmol/L (ref 98–111)
Creatinine, Ser: 0.81 mg/dL (ref 0.44–1.00)
GFR, Estimated: >60 mL/min (ref >60)
Glucose, Bld: 149 mg/dL — ABNORMAL HIGH (ref 70–99)
Potassium: 3.7 mmol/L (ref 3.5–5.1)
Sodium: 140 mmol/L (ref 135–145)

## 2022-11-01 LAB — HEMOGLOBIN A1C
Hgb A1c MFr Bld: 5.3 % (ref 4.8–5.6)
Mean Plasma Glucose: 105.41 mg/dL

## 2022-11-01 LAB — CBC
HCT: 36.5 % (ref 36.0–46.0)
Hemoglobin: 11.8 g/dL — ABNORMAL LOW (ref 12.0–15.0)
MCH: 28.9 pg (ref 26.0–34.0)
MCHC: 32.3 g/dL (ref 30.0–36.0)
MCV: 89.2 fL (ref 80.0–100.0)
Platelets: 206 10*3/uL (ref 150–400)
RBC: 4.09 MIL/uL (ref 3.87–5.11)
RDW: 14.6 % (ref 11.5–15.5)
WBC: 6.4 10*3/uL (ref 4.0–10.5)
nRBC: 0 % (ref 0.0–0.2)

## 2022-11-01 LAB — MAGNESIUM: Magnesium: 1.9 mg/dL (ref 1.7–2.4)

## 2022-11-01 LAB — APTT: aPTT: 123 seconds — ABNORMAL HIGH (ref 24–36)

## 2022-11-01 LAB — HEPARIN LEVEL (UNFRACTIONATED): Heparin Unfractionated: 0.74 IU/mL — ABNORMAL HIGH (ref 0.30–0.70)

## 2022-11-01 SURGERY — LEFT HEART CATH AND CORONARY ANGIOGRAPHY
Anesthesia: Moderate Sedation

## 2022-11-01 MED ORDER — IOHEXOL 300 MG/ML  SOLN
INTRAMUSCULAR | Status: DC | PRN
  Administered 2022-11-01: 52 mL

## 2022-11-01 MED ORDER — ACETAMINOPHEN 325 MG PO TABS: 650.0000 mg | ORAL_TABLET | ORAL | Status: DC | PRN

## 2022-11-01 MED ORDER — APIXABAN 5 MG PO TABS
5.0000 mg | ORAL_TABLET | Freq: Two times a day (BID) | ORAL | Status: DC
  Administered 2022-11-01 – 2022-11-02 (×3): 5 mg via ORAL
  Filled 2022-11-01 (×3): qty 1

## 2022-11-01 MED ORDER — DIPHENHYDRAMINE HCL 50 MG/ML IJ SOLN
INTRAMUSCULAR | Status: AC
  Administered 2022-11-01: 25 mg via INTRAVENOUS
  Filled 2022-11-01: qty 1

## 2022-11-01 MED ORDER — SODIUM CHLORIDE 0.9 % WEIGHT BASED INFUSION
1.0000 mL/kg/h | INTRAVENOUS | Status: DC
  Administered 2022-11-01: 1 mL/kg/h via INTRAVENOUS

## 2022-11-01 MED ORDER — MIDAZOLAM HCL 2 MG/2ML IJ SOLN
INTRAMUSCULAR | Status: AC
  Filled 2022-11-01: qty 2

## 2022-11-01 MED ORDER — SODIUM CHLORIDE 0.9 % WEIGHT BASED INFUSION
1.0000 mL/kg/h | INTRAVENOUS | Status: AC
  Administered 2022-11-01: 1 mL/kg/h via INTRAVENOUS

## 2022-11-01 MED ORDER — MIDAZOLAM HCL 2 MG/2ML IJ SOLN
INTRAMUSCULAR | Status: DC | PRN
  Administered 2022-11-01: 1 mg via INTRAVENOUS

## 2022-11-01 MED ORDER — ASPIRIN 81 MG PO CHEW
CHEWABLE_TABLET | ORAL | Status: AC
  Filled 2022-11-01: qty 1

## 2022-11-01 MED ORDER — FENTANYL CITRATE (PF) 100 MCG/2ML IJ SOLN
INTRAMUSCULAR | Status: DC | PRN
  Administered 2022-11-01: 25 ug via INTRAVENOUS

## 2022-11-01 MED ORDER — GABAPENTIN 400 MG PO CAPS
800.0000 mg | ORAL_CAPSULE | Freq: Three times a day (TID) | ORAL | Status: DC
  Administered 2022-11-01 – 2022-11-02 (×4): 800 mg via ORAL
  Filled 2022-11-01 (×4): qty 2

## 2022-11-01 MED ORDER — DIPHENHYDRAMINE HCL 50 MG/ML IJ SOLN: 25.0000 mg | Freq: Once | INTRAMUSCULAR | Status: AC

## 2022-11-01 MED ORDER — FENTANYL CITRATE (PF) 100 MCG/2ML IJ SOLN
INTRAMUSCULAR | Status: AC
  Filled 2022-11-01: qty 2

## 2022-11-01 MED ORDER — HEPARIN (PORCINE) IN NACL 1000-0.9 UT/500ML-% IV SOLN
INTRAVENOUS | Status: DC | PRN
  Administered 2022-11-01 (×2): 500 mL

## 2022-11-01 MED ORDER — SODIUM CHLORIDE 0.9 % IV SOLN: 250.0000 mL | INTRAVENOUS | Status: DC | PRN

## 2022-11-01 MED ORDER — SODIUM CHLORIDE 0.9% FLUSH
3.0000 mL | Freq: Two times a day (BID) | INTRAVENOUS | Status: DC
  Administered 2022-11-01 – 2022-11-02 (×2): 3 mL via INTRAVENOUS

## 2022-11-01 MED ORDER — DIPHENHYDRAMINE HCL 25 MG PO CAPS
ORAL_CAPSULE | ORAL | Status: AC
  Filled 2022-11-01: qty 2

## 2022-11-01 MED ORDER — HEPARIN (PORCINE) IN NACL 1000-0.9 UT/500ML-% IV SOLN
INTRAVENOUS | Status: AC
  Filled 2022-11-01: qty 500

## 2022-11-01 MED ORDER — LABETALOL HCL 5 MG/ML IV SOLN: 10.0000 mg | INTRAVENOUS | Status: AC | PRN

## 2022-11-01 MED ORDER — SODIUM CHLORIDE 0.9 % WEIGHT BASED INFUSION
3.0000 mL/kg/h | INTRAVENOUS | Status: DC
  Administered 2022-11-01: 3 mL/kg/h via INTRAVENOUS

## 2022-11-01 MED ORDER — SODIUM CHLORIDE 0.9% FLUSH: 3.0000 mL | INTRAVENOUS | Status: DC | PRN

## 2022-11-01 MED ORDER — ASPIRIN 81 MG PO CHEW
81.0000 mg | CHEWABLE_TABLET | ORAL | Status: AC
  Administered 2022-11-01: 81 mg via ORAL

## 2022-11-01 MED ORDER — HYDRALAZINE HCL 20 MG/ML IJ SOLN: 10.0000 mg | INTRAMUSCULAR | Status: AC | PRN

## 2022-11-01 MED ORDER — SERTRALINE HCL 50 MG PO TABS
75.0000 mg | ORAL_TABLET | Freq: Every day | ORAL | Status: DC
  Administered 2022-11-02: 75 mg via ORAL
  Filled 2022-11-01: qty 2

## 2022-11-01 MED ORDER — ONDANSETRON HCL 4 MG/2ML IJ SOLN: 4.0000 mg | Freq: Four times a day (QID) | INTRAMUSCULAR | Status: DC | PRN

## 2022-11-01 SURGICAL SUPPLY — 11 items
CATH INFINITI 5FR MULTPACK ANG (CATHETERS) IMPLANT
DEVICE CLOSURE MYNXGRIP 5F (Vascular Products) IMPLANT
KIT SYRINGE INJ CVI SPIKEX1 (MISCELLANEOUS) IMPLANT
NDL PERC 18GX7CM (NEEDLE) IMPLANT
NEEDLE PERC 18GX7CM (NEEDLE) ×1 IMPLANT
PACK CARDIAC CATH (CUSTOM PROCEDURE TRAY) ×1 IMPLANT
PROTECTION STATION PRESSURIZED (MISCELLANEOUS) ×1
SET ATX SIMPLICITY (MISCELLANEOUS) IMPLANT
SHEATH AVANTI 5FR X 11CM (SHEATH) IMPLANT
STATION PROTECTION PRESSURIZED (MISCELLANEOUS) IMPLANT
WIRE GUIDERIGHT .035X150 (WIRE) IMPLANT

## 2022-11-01 NOTE — Assessment & Plan Note (Signed)
Due to type 2 NSTEMI / demand ischemia. Cardiology did cath today which showed non-obstructive disease, no PCI was required.  No chest pain or acute ischemic EKG changes. Cardiology has cleared for discharge. Follow up next week in Cardiology clinic.

## 2022-11-01 NOTE — Discharge Summary (Signed)
Physician Discharge Summary   Patient: Sarah Riddle MRN: [id number] DOB: [birth year]  Admit date:     10/30/2022  Discharge date: 11/02/2022  Discharge Physician: Ezekiel Slocumb   PCP: Perrin Maltese, MD   Recommendations at discharge:    Follow up with Cardiology as scheduled Follow up with Primary Care in 1-2 weeks Follow up on blood pressure control Repeat CBC, BMP, Mg in 1-2 weeks  ADDENDUM 1/29 PM - discharge cancelled.   Patient quite dizzy on standing, unable to attempt ambulation. Unsafe for discharge today.  Continue to monitor BP's closely. Check orthostatic vitals.  Stable for transfer to tele/med floor.   ADDENDUM 1/30 AM - Patient is improved, no longer dizzy or lightheaded. Ambulating well without symptoms this morning. Medically stable for discharge with cardiology follow up next week   Discharge Diagnoses: Active Problems:   Sensory peripheral neuropathy   Chronic kidney disease   GERD (gastroesophageal reflux disease)   Gout   HTN (hypertension)   HLD (hyperlipidemia)   Hypokalemia   Hereditary hemochromatosis (HCC)   Elevated troponin  Principal Problem (Resolved):   Accelerated hypertension  Hospital Course: Sarah Riddle is a 82 y.o. female with medical history significant of Afib s/p ablation in 2019 on Eliquis, osteoporosis, CKD, GERD, HLD, HTN, IBS, OA, OSA who presented on evening of 10/30/2022 for evaluation of dizziness and upper back pain.  She reported dyspnea recently.  Seen by Dr. Humphrey Rolls, cardiologist for Echo and was started on spironolactone.  She developed dizziness after taking it for the first time day of admission.  She began monitoring her BP at home and noted it was quite high.  She called cardiology earlier and was told to take extra dose losartan.  When BP continued to rise, EMS was called and BP with them was 220/120.  She also reported pain in her back, between her shoulder blades around 6pm.  This has gotten a little better in  the ED with treatment of her blood pressure.  She was started in Nitro drip in the ED since troponin was elevated and trended up. She did not have any chest pain or ischemic EKG changes.  Was empirically started on heparin and cardiology consulted.  Admitted to medicine service.  A CTA chest/abdomen/pelvis was planned to rule out aortic dissection, but patient ended up having only CTA chest PE protocol (for unclear reasons).  CTA was negative for PE.  1/28: resumed home BP meds, remained on Nitro drip at max dose. Cardene drip was started and patient admitted to [facility] unit.    Her back pain is improved and she has equal symmetric radial pulses.  She was agreeable to close monitoring as opposed to repeating pre-treatment again for IV contrast CT scan.  Assessment and Plan: * Accelerated hypertension-resolved as of 11/01/2022 Hypertensive Urgency Initially on Nitro drip, BP's remained elevated.  We added Cardene drip and admitted to stepdown unit. Resumed on home medications - losartan, metoprolol, hydralazine, spironolactone. Given single dose IV Lasix. Suspect this is driven by significant life stressors. BP's today controlled and normal on usual home regimen.    Sensory peripheral neuropathy Continue gabapentin  Elevated troponin Due to type 2 NSTEMI / demand ischemia. Cardiology did cath today which showed non-obstructive disease, no PCI was required.  No chest pain or acute ischemic EKG changes. Cardiology has cleared for discharge. Follow up next week in Cardiology clinic.  Hypertensive urgency See accelerated hypertension  Hereditary hemochromatosis ([place]) Last seen by oncology in November  of 2023.  No need for phlebotomy at that time Continue outpatient follow up.  Hypokalemia K replaced on admission. Monitor BMP, replace K PRN  HLD (hyperlipidemia) Continue statin and Zetia  HTN (hypertension) See accelerated hypertension  Gout Continue allopurinol. No acute  flare sx's reported.  GERD (gastroesophageal reflux disease) Continue PPI  Chronic kidney disease Near baseline. Monitor BMP.         Consultants: Cardiology Procedures performed: Cardiac cath - see report  Disposition: Home Diet recommendation:  Discharge Diet Orders (From admission, onward)     Start     Ordered   11/01/22 0000  Diet - low sodium heart healthy        11/01/22 1538           DISCHARGE MEDICATION: Allergies as of 11/01/2022       Reactions   Iodinated Contrast Media Shortness Of Breath   Can be premedicated   Ibuprofen Other (See Comments)   "stomach problems"   Lubiprostone Nausea And Vomiting      Sulfa Antibiotics Other (See Comments)   "Stomach problems"   Zolpidem Other (See Comments)   "hallucinations" Other reaction(s): Confusion Other Reaction(s): confusion   Boniva [ibandronic Acid] Diarrhea, Nausea And Vomiting   Celecoxib Other (See Comments)   GI upset   Doxycycline Diarrhea, Nausea And Vomiting   Duloxetine Other (See Comments)   Over sedation with Zelnorm (IBS med)   Tetracyclines & Related Nausea Only, Nausea And Vomiting        Medication List     STOP taking these medications    CALTRATE 600+D PO   ezetimibe 10 MG tablet Commonly known as: ZETIA   ondansetron 4 MG disintegrating tablet Commonly known as: ZOFRAN-ODT       TAKE these medications    alendronate 70 MG tablet Commonly known as: FOSAMAX Take 70 mg by mouth every Thursday. Take with a full glass of water on an empty stomach.   allopurinol 100 MG tablet Commonly known as: ZYLOPRIM Take 100 mg by mouth daily.   apixaban 5 MG Tabs tablet Commonly known as: ELIQUIS Take 5 mg by mouth 2 (two) times daily.   Cranberry 125 MG Tabs Take 125 mg by mouth daily.   EpiPen 2-Pak 0.3 mg/0.3 mL Soaj injection Generic drug: EPINEPHrine Inject 0.3 mg into the muscle as needed for anaphylaxis.   fluticasone 50 MCG/ACT nasal spray Commonly known as:  FLONASE Place 2 sprays into both nostrils daily.   gabapentin 800 MG tablet Commonly known as: NEURONTIN Take 800 mg by mouth in the morning, at noon, in the evening, and at bedtime.   hydrALAZINE 25 MG tablet Commonly known as: APRESOLINE Take 25 mg by mouth in the morning and at bedtime.   HYDROcodone-acetaminophen 5-325 MG tablet Commonly known as: NORCO/VICODIN Take 1 tablet by mouth every 6 (six) hours as needed for moderate pain.   isosorbide mononitrate 60 MG 24 hr tablet Commonly known as: IMDUR Take 60 mg by mouth daily.   levocetirizine 5 MG tablet Commonly known as: XYZAL Take 5 mg by mouth every evening.   losartan 25 MG tablet Commonly known as: COZAAR Take 25 mg by mouth 2 (two) times daily.   metoprolol succinate 25 MG 24 hr tablet Commonly known as: TOPROL-XL Take 25 mg by mouth daily.   nitroGLYCERIN 0.4 MG SL tablet Commonly known as: NITROSTAT Place 0.4 mg under the tongue every 5 (five) minutes as needed for chest pain.   pantoprazole 40 MG  tablet Commonly known as: PROTONIX Take 40 mg by mouth daily.   rosuvastatin 40 MG tablet Commonly known as: CRESTOR Take 40 mg by mouth at bedtime.   sertraline 25 MG tablet Commonly known as: ZOLOFT Take 25 mg by mouth 3 (three) times daily.   spironolactone 25 MG tablet Commonly known as: ALDACTONE Take 25 mg by mouth daily.   tetrahydrozoline-zinc 0.05-0.25 % ophthalmic solution Commonly known as: VISINE-AC Place 2 drops into both eyes 3 (three) times daily as needed (dry eyes).        Discharge Exam: Filed Weights   10/30/22 2009 10/31/22 1458 11/01/22 0448  Weight: 87.5 kg 86.5 kg 84.1 kg   General exam: awake, alert, no acute distress HEENT: atraumatic, clear conjunctiva, anicteric sclera, moist mucus membranes, hearing grossly normal  Respiratory system: CTAB, no wheezes, rales or rhonchi, normal respiratory effort. Cardiovascular system: normal S1/S2, RRR, no JVD, murmurs, rubs,  gallops,  no pedal edema.   Gastrointestinal system: soft, NT, ND, no HSM felt, +bowel sounds. Central nervous system: A&O x4. no gross focal neurologic deficits, normal speech Extremities: moves all , no edema, normal tone Skin: dry, intact, normal temperature, normal color, No rashes, lesions or ulcers Psychiatry: normal mood, congruent affect, judgement and insight appear normal   Condition at discharge: stable  The results of significant diagnostics from this hospitalization (including imaging, microbiology, ancillary and laboratory) are listed below for reference.   Imaging Studies: CARDIAC CATHETERIZATION  Result Date: 11/01/2022   1st Mrg lesion is 30% stenosed.   LV end diastolic pressure is normal.   The left ventricular ejection fraction is 50-55% by visual estimate. Mild disease in first diagonal about 35 to 40% rest of the coronaries are normal.  Patient does not have any significant coronary artery disease.  Elevated troponin probably due to demand ischemia.   CT Angio Chest PE W/Cm &/Or Wo Cm  Result Date: 10/31/2022 CLINICAL DATA:  Dizziness and elevated blood pressure.  PE suspected EXAM: CT ANGIOGRAPHY CHEST WITH CONTRAST TECHNIQUE: Multidetector CT imaging of the chest was performed using the standard protocol during bolus administration of intravenous contrast. Multiplanar CT image reconstructions and MIPs were obtained to evaluate the vascular anatomy. RADIATION DOSE REDUCTION: This exam was performed according to the departmental dose-optimization program which includes automated exposure control, adjustment of the mA and/or kV according to patient size and/or use of iterative reconstruction technique. CONTRAST:  62m OMNIPAQUE IOHEXOL 350 MG/ML SOLN COMPARISON:  Radiograph 10/30/2022 and CT chest 08/03/2021 FINDINGS: Cardiovascular: Satisfactory opacification of the pulmonary arteries to the segmental level. No evidence of pulmonary embolism. Cardiomegaly. Small pericardial  effusion. Mitral annular and aortic valve calcification. Coronary artery and aortic atherosclerotic calcification. Mediastinum/Nodes: No enlarged mediastinal, hilar, or axillary lymph nodes. Thyroid gland, trachea, and esophagus demonstrate no significant findings. Lungs/Pleura: Patchy ground-glass opacities and interlobular septal thickening. Moderate right and small left pleural effusions. Mild bronchial wall thickening. Stable 7 x 5 mm subpleural left lingular pulmonary nodule (5/114). This is unchanged from 08/03/2021. Upper Abdomen: No acute abnormality. Musculoskeletal: No chest wall abnormality. No acute fracture. Review of the MIP images confirms the above findings. IMPRESSION: 1. Negative for acute pulmonary embolism. 2. Findings favored to represent CHF with moderate right and small left pleural effusions. Atypical infection is less likely but not excluded. 3. Coronary artery atherosclerosis. Aortic Atherosclerosis (ICD10-I70.0). Electronically Signed   By: TPlacido SouM.D.   On: 10/31/2022 02:05   DG Chest 2 View  Result Date: 10/30/2022 CLINICAL DATA:  Shortness  of breath and back pain EXAM: CHEST - 2 VIEW COMPARISON:  06/30/2022 FINDINGS: Stable cardiomediastinal silhouette. Aortic atherosclerotic calcification. No focal consolidation, pleural effusion, or pneumothorax. Chronic interstitial coarsening, unchanged. No acute osseous abnormality. IMPRESSION: No active cardiopulmonary disease. Stable chronic interstitial coarsening. Electronically Signed   By: Placido Sou M.D.   On: 10/30/2022 20:34    Microbiology: Results for orders placed or performed during the hospital encounter of 10/30/22  MRSA Next Gen by PCR, Nasal     Status: None   Collection Time: 10/31/22  2:58 PM   Specimen: Nasal Mucosa; Nasal Swab  Result Value Ref Range Status   MRSA by PCR Next Gen NOT DETECTED NOT DETECTED Final    Comment: (NOTE) The GeneXpert MRSA Assay (FDA approved for NASAL specimens only), is  one component of a comprehensive MRSA colonization surveillance program. It is not intended to diagnose MRSA infection nor to guide or monitor treatment for MRSA infections. Test performance is not FDA approved in patients less than 19 years old. Performed at [facility], [address] [place], [place] [address]     Labs: CBC: Recent Labs  Lab 10/30/22 2016 10/31/22 0542 11/01/22 0318  WBC 6.1 4.7 6.4  HGB 12.9 11.8* 11.8*  HCT 40.2 37.2 36.5  MCV 90.5 90.3 89.2  PLT 195 206 110   Basic Metabolic Panel: Recent Labs  Lab 10/30/22 2016 10/31/22 0542 11/01/22 0318  NA 140 137 140  K 3.2* 3.3* 3.7  CL 103 107 102  CO2 '26 26 27  '$ GLUCOSE 107* 177* 149*  BUN '10 9 13  '$ CREATININE 0.64 0.63 0.81  CALCIUM 9.7 9.1 9.6  MG  --   --  1.9   Liver Function Tests: Recent Labs  Lab 10/31/22 0542  AST 25  ALT 10  ALKPHOS 60  BILITOT 1.0  PROT 6.1*  ALBUMIN 3.5   CBG: Recent Labs  Lab 10/31/22 0724 10/31/22 1503 10/31/22 2106 11/01/22 1043 11/01/22 1431  GLUCAP 150* 130* 114* 117* 213*    Discharge time spent: greater than 30 minutes.  Signed: Ezekiel Slocumb, DO [facility] 11/01/2022

## 2022-11-01 NOTE — Plan of Care (Signed)
  Problem: Education: °Goal: Understanding of CV disease, CV risk reduction, and recovery process will improve °Outcome: Progressing °Goal: Individualized Educational Video(s) °Outcome: Progressing °  °Problem: Activity: °Goal: Ability to return to baseline activity level will improve °Outcome: Progressing °  °Problem: Cardiovascular: °Goal: Ability to achieve and maintain adequate cardiovascular perfusion will improve °Outcome: Progressing °Goal: Vascular access site(s) Level 0-1 will be maintained °Outcome: Progressing °  °Problem: Health Behavior/Discharge Planning: °Goal: Ability to safely manage health-related needs after discharge will improve °Outcome: Progressing °  °Problem: Education: °Goal: Knowledge of General Education information will improve °Description: Including pain rating scale, medication(s)/side effects and non-pharmacologic comfort measures °Outcome: Progressing °  °Problem: Health Behavior/Discharge Planning: °Goal: Ability to manage health-related needs will improve °Outcome: Progressing °  °Problem: Clinical Measurements: °Goal: Ability to maintain clinical measurements within normal limits will improve °Outcome: Progressing °Goal: Will remain free from infection °Outcome: Progressing °Goal: Diagnostic test results will improve °Outcome: Progressing °Goal: Respiratory complications will improve °Outcome: Progressing °Goal: Cardiovascular complication will be avoided °Outcome: Progressing °  °Problem: Activity: °Goal: Risk for activity intolerance will decrease °Outcome: Progressing °  °Problem: Nutrition: °Goal: Adequate nutrition will be maintained °Outcome: Progressing °  °Problem: Coping: °Goal: Level of anxiety will decrease °Outcome: Progressing °  °Problem: Elimination: °Goal: Will not experience complications related to bowel motility °Outcome: Progressing °Goal: Will not experience complications related to urinary retention °Outcome: Progressing °  °Problem: Pain Managment: °Goal:  General experience of comfort will improve °Outcome: Progressing °  °Problem: Safety: °Goal: Ability to remain free from injury will improve °Outcome: Progressing °  °Problem: Skin Integrity: °Goal: Risk for impaired skin integrity will decrease °Outcome: Progressing °  °

## 2022-11-01 NOTE — Progress Notes (Signed)
ANTICOAGULATION CONSULT NOTE  Pharmacy Consult for heparin infusion Indication: ACS/STEMI  Allergies  Allergen Reactions   Iodinated Contrast Media Shortness Of Breath    Can be premedicated   Ibuprofen Other (See Comments)    "stomach problems"    Lubiprostone Nausea And Vomiting        Sulfa Antibiotics Other (See Comments)    "Stomach problems"    Zolpidem Other (See Comments)    "hallucinations"  Other reaction(s): Confusion  Other Reaction(s): confusion   Boniva [Ibandronic Acid] Diarrhea and Nausea And Vomiting   Celecoxib Other (See Comments)    GI upset    Doxycycline Diarrhea and Nausea And Vomiting   Duloxetine Other (See Comments)    Over sedation with Zelnorm (IBS med)   Tetracyclines & Related Nausea Only and Nausea And Vomiting    Patient Measurements: Height: '5\' 11"'$  (180.3 cm) Weight: 86.5 kg (190 lb 11.2 oz) IBW/kg (Calculated) : 70.8 Heparin Dosing Weight: 87.5 kg  Vital Signs: Temp: 97.8 F (36.6 C) (01/28 2000) BP: 114/63 (01/29 0300) Pulse Rate: 64 (01/29 0300)  Labs: Recent Labs    10/30/22 2016 10/30/22 2237 10/31/22 0012 10/31/22 0542 10/31/22 0721 10/31/22 0846 10/31/22 1118 10/31/22 2017 11/01/22 0318  HGB 12.9  --   --  11.8*  --   --   --   --  11.8*  HCT 40.2  --   --  37.2  --   --   --   --  36.5  PLT 195  --   --  206  --   --   --   --  206  APTT  --   --  47*  --   --    < > >200* 60* 123*  LABPROT  --   --  14.6  --   --   --   --   --   --   INR  --   --  1.2  --   --   --   --   --   --   HEPARINUNFRC  --   --  >1.10*  --   --   --   --   --  0.74*  CREATININE 0.64  --   --  0.63  --   --   --   --  0.81  TROPONINIHS 119* 642*  --  811* 768*  --   --   --   --    < > = values in this interval not displayed.     Estimated Creatinine Clearance: 65.2 mL/min (by C-G formula based on SCr of 0.81 mg/dL).   Medical History: Past Medical History:  Diagnosis Date   A-fib [facility])    Age related osteoporosis     Cancer ([place])    Melanoma x 2 -- Lt Leg -1976 Rt Arm - 2014   Chronic bilateral low back pain    Chronic kidney disease    Complication of anesthesia    DVT (deep venous thrombosis) ([place]) 1960   right leg  was on BC pills   Dysrhythmia    Family history of breast cancer    Family history of colon cancer    Family history of melanoma    Family history of prostate cancer    Fibrocystic breast disease    GERD (gastroesophageal reflux disease)    Headache    migraine   Heart murmur    not heard now.   History of kidney  stones    Hyperlipidemia    Hypertension    IBS (irritable bowel syndrome)    Lumbago    Mitral valve prolapse    Neuropathy    Obstructive sleep apnea    Osteoarthritis of shoulder    PONV (postoperative nausea and vomiting)     Medications:  PTA Meds: Apixaban 5 mg (last dose 1/27)  Assessment: Pt is a 82 yo female presenting to ED w/ hypertension & midthoracic back pain, found with elevated troponin I lvl, trending up. 1/28 0012  HL>1.10,  aPTT 47, INR 1.2    1/28 0846 aPTT=162   will recheck  1/28 1118 aPTT >200   Hold drip x 1 hour and resume at 950 u/hr 1/28 2017 aPTT = 60, subtherapeutic @ 950 un/hr 1/29 0318 aPTT = 123, supratherapeutic, HL 0.74  Goal of Therapy:  Heparin level 0.3-0.7 units/ml aPTT 66-102 seconds Monitor platelets by anticoagulation protocol: Yes   Plan:  --Decrease heparin drip back to 950 units/hr --Will follow aPTT until correlation w/ HL confirmed --Will check aPTT & HL in 8 hr after rate change --CBC daily while on heparin  Renda Rolls, PharmD, [facility] 11/01/2022 4:15 AM

## 2022-11-01 NOTE — Progress Notes (Signed)
SUBJECTIVE: Patient denies any chest pain but had some back pain   Vitals:   11/01/22 0700 11/01/22 0800 11/01/22 0826 11/01/22 0850  BP: 122/63 (!) 151/84 (!) 154/91   Pulse: 68 88 95   Resp: 17 (!) 21 16   Temp:  98.7 F (37.1 C) 98.2 F (36.8 C)   TempSrc:  Oral Oral   SpO2: 91% 97% 95% 98%  Weight:      Height:        Intake/Output Summary (Last 24 hours) at 11/01/2022 0915 Last data filed at 11/01/2022 0800 Gross per 24 hour  Intake 1028.37 ml  Output 3875 ml  Net -2846.63 ml    LABS: Basic Metabolic Panel: Recent Labs    10/31/22 0542 11/01/22 0318  NA 137 140  K 3.3* 3.7  CL 107 102  CO2 26 27  GLUCOSE 177* 149*  BUN 9 13  CREATININE 0.63 0.81  CALCIUM 9.1 9.6  MG  --  1.9   Liver Function Tests: Recent Labs    10/31/22 0542  AST 25  ALT 10  ALKPHOS 60  BILITOT 1.0  PROT 6.1*  ALBUMIN 3.5   No results for input(s): "LIPASE", "AMYLASE" in the last 72 hours. CBC: Recent Labs    10/31/22 0542 11/01/22 0318  WBC 4.7 6.4  HGB 11.8* 11.8*  HCT 37.2 36.5  MCV 90.3 89.2  PLT 206 206   Cardiac Enzymes: No results for input(s): "CKTOTAL", "CKMB", "CKMBINDEX", "TROPONINI" in the last 72 hours. BNP: Invalid input(s): "POCBNP" D-Dimer: No results for input(s): "DDIMER" in the last 72 hours. Hemoglobin A1C: Recent Labs    10/31/22 0542  HGBA1C 5.3   Fasting Lipid Panel: No results for input(s): "CHOL", "HDL", "LDLCALC", "TRIG", "CHOLHDL", "LDLDIRECT" in the last 72 hours. Thyroid Function Tests: No results for input(s): "TSH", "T4TOTAL", "T3FREE", "THYROIDAB" in the last 72 hours.  Invalid input(s): "FREET3" Anemia Panel: No results for input(s): "VITAMINB12", "FOLATE", "FERRITIN", "TIBC", "IRON", "RETICCTPCT" in the last 72 hours.   PHYSICAL EXAM General: Well developed, well nourished, in no acute distress HEENT:  Normocephalic and atramatic Neck:  No JVD.  Lungs: Clear bilaterally to auscultation and percussion. Heart: HRRR . Normal  S1 and S2 without gallops or murmurs.  Abdomen: Bowel sounds are positive, abdomen soft and non-tender  Msk:  Back normal, normal gait. Normal strength and tone for age. Extremities: No clubbing, cyanosis or edema.   Neuro: Alert and oriented X 3. Psych:  Good affect, responds appropriately  TELEMETRY: Sinus rhythm  ASSESSMENT AND PLAN: Mildly elevated troponin due to demand ischemia since coronaries on cardiac catheterization revealed no significant coronary disease.  Cardiac catheterization revealed first diagonal mild disease rest of the coronaries were normal.  Cardiac point of view patient can be discharged with follow-up in the office next week on Tuesday at 9 AM.  Principal Problem:   Accelerated hypertension Active Problems:   Chronic kidney disease   GERD (gastroesophageal reflux disease)   Gout   Sensory peripheral neuropathy   HTN (hypertension)   HLD (hyperlipidemia)   Hypokalemia   Hereditary hemochromatosis ([place])   Hypertensive urgency    Neoma Laming A, MD, [facility] 11/01/2022 9:15 AM

## 2022-11-01 NOTE — TOC Initial Note (Signed)
Transition of Care [facility]) - Initial/Assessment Note    Patient Details  Name: Sarah Riddle MRN: [id number] Date of Birth: 06/19/1941  Transition of Care [facility]) CM/SW Contact:    Shelbie Hutching, RN Phone Number: 11/01/2022, 3:43 PM  Clinical Narrative:                  Transition of Care [facility]) Screening Note   Patient Details  Name: Sarah Riddle Date of Birth: 06-03-1941   Transition of Care [facility]) CM/SW Contact:    Shelbie Hutching, RN Phone Number: 11/01/2022, 3:43 PM    Transition of Care Department [facility]) has reviewed patient and no TOC needs have been identified at this time. We will continue to monitor patient advancement through interdisciplinary progression rounds. If new patient transition needs arise, please place a TOC consult.          Patient Goals and CMS Choice            Expected Discharge Plan and Services         Expected Discharge Date: 11/01/22                                    Prior Living Arrangements/Services                       Activities of Daily Living      Permission Sought/Granted                  Emotional Assessment              Admission diagnosis:  Accelerated hypertension [I10] NSTEMI (non-ST elevated myocardial infarction) ([place]) [I21.4] Hypertensive urgency [I16.0] Hypertensive emergency [I16.1] Acute bilateral thoracic back pain [M54.6] Patient Active Problem List   Diagnosis Date Noted   Accelerated hypertension 10/31/2022   Hypertensive urgency 10/31/2022   Hereditary hemochromatosis ([place]) 08/04/2022   Fatty liver disease, nonalcoholic 27/25/3664   Closed fracture of right proximal humerus 05/05/2022   Orthostatic hypotension 02/18/2022   Fall 02/17/2022   Pelvic fracture ([place]) 02/17/2022   Hematoma_left gluteal region 02/17/2022   Acute blood loss anemia 02/17/2022   AKI (acute kidney injury) ([place]) 02/17/2022   HTN (hypertension) 02/17/2022   HLD (hyperlipidemia)  02/17/2022   Hypokalemia 02/17/2022   Genetic testing 11/10/2021   Family history of melanoma 10/21/2021   Family history of prostate cancer 10/21/2021   Family history of breast cancer 10/21/2021   Family history of colon cancer 10/21/2021   CAP (community acquired pneumonia) 08/04/2021   Bronchospasm    Hypoxia    Community acquired bilateral lower lobe pneumonia 08/03/2021   Acute bronchitis    Atypical pneumonia    Acute respiratory failure with hypoxia ([place])    History of melanoma 05/20/2021   Family history of cancer 05/20/2021   Normocytic anemia 05/19/2021   History of allergy to radiographic contrast media 03/20/2020   Abnormal MRI, cervical spine (09/24/2019) 03/10/2020   Radicular pain of shoulder (Left) 03/10/2020   Cervical foraminal stenosis (Bilateral) (C3-4, C4-5, C5-6) 09/10/2019   Cervical grade 1 Anterolisthesis of C7/T1 09/10/2019   Cervical grade 1 Retrolisthesis of C4/C5 09/10/2019   Cervical facet hypertrophy 09/10/2019   Sensory peripheral neuropathy 02/15/2019   Neurogenic pain 02/15/2019   Chronic kidney disease 01/24/2019   Fibrocystic breast disease 01/24/2019   GERD (gastroesophageal reflux disease) 01/24/2019   Gout 01/24/2019   Hyperlipidemia  01/24/2019   Irritable bowel syndrome 01/24/2019   Migraine headache 01/24/2019   Osteoporosis 01/24/2019   Chronic pain syndrome 01/24/2019   Chronic feet pain (Primary area of Pain) (Bilateral) (L>R) 01/24/2019   Chronic neck pain (Fourth Area of Pain) (Right) 01/24/2019   Cervicalgia (Right) 01/24/2019   Chronic shoulder pain (Fifth Area of Pain) (Left) 01/24/2019   Chronic knee pain (Sixth Area of Pain) (Left) 01/24/2019   Chronic anticoagulation (Coumadin) 01/24/2019   Disorder of skeletal system 01/24/2019   Pharmacologic therapy 01/24/2019   Problems influencing health status 01/24/2019   DDD (degenerative disc disease), cervical 01/24/2019   DDD (degenerative disc disease), lumbar 01/24/2019    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   Numbness and tingling of feet (Bilateral) 11/28/2018   PAF (paroxysmal atrial fibrillation) ([place]) 03/05/2018   Nontraumatic complete tear of rotator cuff (Left) 01/13/2018   Osteoarthritis of shoulder (Left) 01/13/2018   Rotator cuff tendinitis, left 01/13/2018   Osteoarthritis of knee (Left) 02/21/2017   Age-related osteoporosis without current pathological fracture 01/15/2016   Allergic rhinitis 01/15/2016   Hypertension 01/15/2016   Obstructive sleep apnea (adult) (pediatric) 01/15/2016   Pain in both lower extremities 10/29/2015   Chronic tension-type headache, not intractable 07/18/2014   Difficulty walking 07/18/2014   Obesity (BMI 30-39.9) 07/18/2014   Neuropathy 07/18/2014   Chronic pain of lower extremity (Bilateral) 03/13/2014   Cervicogenic headache (Tertiary Area of Pain) (Right) 03/13/2014   Numbness and tingling 03/13/2014   Chronic low back pain (Secondary Area of Pain) (Bilateral) (R>L) without sciatica 01/08/2014   Osteoarthrosis, unspecified whether generalized or localized, pelvic region and thigh 01/08/2014   Melanoma of upper arm ([place]) 02/21/2013   PCP:  Perrin Maltese, MD Pharmacy:   [place], [place] - [address] [place] [address] Phone: [phone] Fax: [phone]     Social Determinants of Health (SDOH) Social History: SDOH Screenings   Tobacco Use: Low Risk  (11/01/2022)   SDOH Interventions:     Readmission Risk Interventions     No data to display

## 2022-11-02 DIAGNOSIS — I4891 Unspecified atrial fibrillation: Secondary | ICD-10-CM

## 2022-11-02 DIAGNOSIS — I10 Essential (primary) hypertension: Secondary | ICD-10-CM

## 2022-11-02 DIAGNOSIS — I2510 Atherosclerotic heart disease of native coronary artery without angina pectoris: Secondary | ICD-10-CM

## 2022-11-02 LAB — GLUCOSE, CAPILLARY: Glucose-Capillary: 77 mg/dL (ref 70–99)

## 2022-11-02 NOTE — Plan of Care (Signed)
Problem: Education: Goal: Understanding of CV disease, CV risk reduction, and recovery process will improve 11/02/2022 1207 by Shauna Hugh, RN Outcome: Adequate for Discharge 11/02/2022 0849 by Shauna Hugh, RN Outcome: Progressing Goal: Individualized Educational Video(s) 11/02/2022 1207 by Shauna Hugh, RN Outcome: Adequate for Discharge 11/02/2022 0849 by Shauna Hugh, RN Outcome: Progressing   Problem: Activity: Goal: Ability to return to baseline activity level will improve 11/02/2022 1207 by Shauna Hugh, RN Outcome: Adequate for Discharge 11/02/2022 [phone] by Shauna Hugh, RN Outcome: Progressing   Problem: Cardiovascular: Goal: Ability to achieve and maintain adequate cardiovascular perfusion will improve 11/02/2022 1207 by Shauna Hugh, RN Outcome: Adequate for Discharge 11/02/2022 0849 by Shauna Hugh, RN Outcome: Progressing Goal: Vascular access site(s) Level 0-1 will be maintained 11/02/2022 1207 by Shauna Hugh, RN Outcome: Adequate for Discharge 11/02/2022 0849 by Shauna Hugh, RN Outcome: Progressing   Problem: Health Behavior/Discharge Planning: Goal: Ability to safely manage health-related needs after discharge will improve 11/02/2022 1207 by Shauna Hugh, RN Outcome: Adequate for Discharge 11/02/2022 0849 by Shauna Hugh, RN Outcome: Progressing   Problem: Education: Goal: Knowledge of General Education information will improve Description: Including pain rating scale, medication(s)/side effects and non-pharmacologic comfort measures 11/02/2022 1207 by Shauna Hugh, RN Outcome: Adequate for Discharge 11/02/2022 0849 by Shauna Hugh, RN Outcome: Progressing   Problem: Health Behavior/Discharge Planning: Goal: Ability to manage health-related needs will improve 11/02/2022 1207 by Shauna Hugh, RN Outcome: Adequate for Discharge 11/02/2022 0849 by Shauna Hugh, RN Outcome: Progressing   Problem: Clinical Measurements: Goal: Ability to maintain  clinical measurements within normal limits will improve 11/02/2022 1207 by Shauna Hugh, RN Outcome: Adequate for Discharge 11/02/2022 0849 by Shauna Hugh, RN Outcome: Progressing Goal: Will remain free from infection 11/02/2022 1207 by Shauna Hugh, RN Outcome: Adequate for Discharge 11/02/2022 0849 by Shauna Hugh, RN Outcome: Progressing Goal: Diagnostic test results will improve 11/02/2022 1207 by Shauna Hugh, RN Outcome: Adequate for Discharge 11/02/2022 0849 by Shauna Hugh, RN Outcome: Progressing Goal: Respiratory complications will improve 11/02/2022 1207 by Shauna Hugh, RN Outcome: Adequate for Discharge 11/02/2022 0849 by Shauna Hugh, RN Outcome: Progressing Goal: Cardiovascular complication will be avoided 11/02/2022 1207 by Shauna Hugh, RN Outcome: Adequate for Discharge 11/02/2022 0849 by Shauna Hugh, RN Outcome: Progressing   Problem: Activity: Goal: Risk for activity intolerance will decrease 11/02/2022 1207 by Shauna Hugh, RN Outcome: Adequate for Discharge 11/02/2022 [phone] by Shauna Hugh, RN Outcome: Progressing   Problem: Nutrition: Goal: Adequate nutrition will be maintained 11/02/2022 1207 by Shauna Hugh, RN Outcome: Adequate for Discharge 11/02/2022 0849 by Shauna Hugh, RN Outcome: Progressing   Problem: Coping: Goal: Level of anxiety will decrease 11/02/2022 1207 by Shauna Hugh, RN Outcome: Adequate for Discharge 11/02/2022 0849 by Shauna Hugh, RN Outcome: Progressing   Problem: Elimination: Goal: Will not experience complications related to bowel motility 11/02/2022 1207 by Shauna Hugh, RN Outcome: Adequate for Discharge 11/02/2022 0849 by Shauna Hugh, RN Outcome: Progressing Goal: Will not experience complications related to urinary retention 11/02/2022 1207 by Shauna Hugh, RN Outcome: Adequate for Discharge 11/02/2022 0849 by Shauna Hugh, RN Outcome: Progressing   Problem: Pain Managment: Goal: General experience of  comfort will improve 11/02/2022 1207 by Shauna Hugh, RN Outcome: Adequate for Discharge 11/02/2022 0849 by Shauna Hugh, RN Outcome: Progressing   Problem: Safety: Goal: Ability to remain free from injury will improve 11/02/2022 1207 by Shauna Hugh, RN Outcome: Adequate for Discharge 11/02/2022 0849 by Shauna Hugh, RN Outcome: Progressing   Problem: Skin Integrity: Goal: Risk for impaired skin  integrity will decrease 11/02/2022 1207 by Shauna Hugh, RN Outcome: Adequate for Discharge 11/02/2022 [phone] by Shauna Hugh, RN Outcome: Progressing

## 2022-11-02 NOTE — Plan of Care (Signed)
  Problem: Education: °Goal: Understanding of CV disease, CV risk reduction, and recovery process will improve °Outcome: Progressing °Goal: Individualized Educational Video(s) °Outcome: Progressing °  °Problem: Activity: °Goal: Ability to return to baseline activity level will improve °Outcome: Progressing °  °Problem: Cardiovascular: °Goal: Ability to achieve and maintain adequate cardiovascular perfusion will improve °Outcome: Progressing °Goal: Vascular access site(s) Level 0-1 will be maintained °Outcome: Progressing °  °Problem: Health Behavior/Discharge Planning: °Goal: Ability to safely manage health-related needs after discharge will improve °Outcome: Progressing °  °Problem: Education: °Goal: Knowledge of General Education information will improve °Description: Including pain rating scale, medication(s)/side effects and non-pharmacologic comfort measures °Outcome: Progressing °  °Problem: Health Behavior/Discharge Planning: °Goal: Ability to manage health-related needs will improve °Outcome: Progressing °  °Problem: Clinical Measurements: °Goal: Ability to maintain clinical measurements within normal limits will improve °Outcome: Progressing °Goal: Will remain free from infection °Outcome: Progressing °Goal: Diagnostic test results will improve °Outcome: Progressing °Goal: Respiratory complications will improve °Outcome: Progressing °Goal: Cardiovascular complication will be avoided °Outcome: Progressing °  °Problem: Activity: °Goal: Risk for activity intolerance will decrease °Outcome: Progressing °  °Problem: Nutrition: °Goal: Adequate nutrition will be maintained °Outcome: Progressing °  °Problem: Coping: °Goal: Level of anxiety will decrease °Outcome: Progressing °  °Problem: Elimination: °Goal: Will not experience complications related to bowel motility °Outcome: Progressing °Goal: Will not experience complications related to urinary retention °Outcome: Progressing °  °Problem: Pain Managment: °Goal:  General experience of comfort will improve °Outcome: Progressing °  °Problem: Safety: °Goal: Ability to remain free from injury will improve °Outcome: Progressing °  °Problem: Skin Integrity: °Goal: Risk for impaired skin integrity will decrease °Outcome: Progressing °  °

## 2022-11-03 LAB — LIPOPROTEIN A (LPA): Lipoprotein (a): 127 nmol/L — ABNORMAL HIGH (ref <75.0)

## 2022-11-05 ENCOUNTER — Other Ambulatory Visit: Payer: Self-pay | Admitting: Cardiovascular Disease

## 2022-11-05 ENCOUNTER — Encounter: Admission: RE | Payer: Self-pay | Source: Home / Self Care

## 2022-11-05 ENCOUNTER — Ambulatory Visit: Admission: RE | Admit: 2022-11-05 | Payer: Medicare PPO | Source: Home / Self Care | Admitting: Gastroenterology

## 2022-11-05 SURGERY — COLONOSCOPY
Anesthesia: General

## 2022-11-10 ENCOUNTER — Ambulatory Visit: Payer: Medicare PPO | Admitting: Nurse Practitioner

## 2022-11-15 ENCOUNTER — Ambulatory Visit: Payer: Medicare PPO | Admitting: Cardiovascular Disease

## 2022-11-15 ENCOUNTER — Encounter: Payer: Self-pay | Admitting: Cardiovascular Disease

## 2022-11-15 ENCOUNTER — Other Ambulatory Visit: Payer: Self-pay | Admitting: Cardiovascular Disease

## 2022-11-15 VITALS — BP 126/58 | HR 69 | Ht 71.0 in | Wt 190.0 lb

## 2022-11-15 DIAGNOSIS — I214 Non-ST elevation (NSTEMI) myocardial infarction: Secondary | ICD-10-CM

## 2022-11-15 DIAGNOSIS — I480 Paroxysmal atrial fibrillation: Secondary | ICD-10-CM | POA: Diagnosis not present

## 2022-11-15 DIAGNOSIS — I10 Essential (primary) hypertension: Secondary | ICD-10-CM | POA: Diagnosis not present

## 2022-11-15 DIAGNOSIS — I951 Orthostatic hypotension: Secondary | ICD-10-CM

## 2022-11-15 DIAGNOSIS — G4733 Obstructive sleep apnea (adult) (pediatric): Secondary | ICD-10-CM | POA: Diagnosis not present

## 2022-11-15 DIAGNOSIS — R42 Dizziness and giddiness: Secondary | ICD-10-CM | POA: Insufficient documentation

## 2022-11-15 NOTE — Progress Notes (Signed)
Cardiology Office Note   Date:  11/15/2022   ID:  Sarah Riddle, Sarah Riddle 07-23-1941, MRN [id number]  PCP:  Perrin Maltese, MD  Cardiologist:  Neoma Laming, MD   History of Present Illness: Sarah Riddle is a 82 y.o. female who presents for  Chief Complaint  Patient presents with  . Hospitalization Follow-up    Hospital Follow Up    Dizziness This is a new problem. The current episode started today. The problem occurs 2 to 4 times per day. The problem has been gradually worsening. The symptoms are aggravated by walking. The treatment provided moderate relief.    Past Medical History:  Diagnosis Date  . A-fib ([place])   . Accelerated hypertension 10/31/2022  . Age related osteoporosis   . Cancer ([place])    Melanoma x 2 -- Lt Leg -1976 Rt Arm - 2014  . Chronic bilateral low back pain   . Chronic kidney disease   . Complication of anesthesia   . DVT (deep venous thrombosis) ([place]) 1960   right leg  was on BC pills  . Dysrhythmia   . Family history of breast cancer   . Family history of colon cancer   . Family history of melanoma   . Family history of prostate cancer   . Fibrocystic breast disease   . GERD (gastroesophageal reflux disease)   . Headache    migraine  . Heart murmur    not heard now.  . History of kidney stones   . Hyperlipidemia   . Hypertension   . IBS (irritable bowel syndrome)   . Lumbago   . Mitral valve prolapse   . Neuropathy   . Obstructive sleep apnea   . Osteoarthritis of shoulder   . PONV (postoperative nausea and vomiting)     Past Surgical History:  Procedure Laterality Date  . ABDOMINAL HYSTERECTOMY    . BACK SURGERY    . BREAST CYST ASPIRATION Left 2001   Negative  . CARDIAC ELECTROPHYSIOLOGY MAPPING AND ABLATION    . EYE SURGERY    . HUMERUS IM NAIL Right 05/05/2022   Procedure: INTRAMEDULLARY (IM) NAIL HUMERAL;  Surgeon: Shona Needles, MD;  Location: [facility];  Service: Orthopedics;  Laterality: Right;  . HYSTEROTOMY    .  L4-L5  2020   titaum rebok cage  . LEFT HEART CATH AND CORONARY ANGIOGRAPHY N/A 11/01/2022   Procedure: LEFT HEART CATH AND CORONARY ANGIOGRAPHY and possible PCI and stent;  Surgeon: Dionisio David, MD;  Location: [facility];  Service: Cardiovascular;  Laterality: N/A;  . TOTAL HIP ARTHROPLASTY Left      Current Outpatient Medications  Medication Sig Dispense Refill  . alendronate (FOSAMAX) 70 MG tablet Take 70 mg by mouth every Thursday. Take with a full glass of water on an empty stomach.    Marland Kitchen allopurinol (ZYLOPRIM) 100 MG tablet Take 100 mg by mouth daily.    . Cranberry 125 MG TABS Take 125 mg by mouth daily.    Marland Kitchen ELIQUIS 5 MG TABS tablet TAKE 1 TABLET TWICE DAILY 60 tablet 3  . EPINEPHrine (EPIPEN 2-PAK) 0.3 mg/0.3 mL IJ SOAJ injection Inject 0.3 mg into the muscle as needed for anaphylaxis.    Marland Kitchen ezetimibe (ZETIA) 10 MG tablet Take 10 mg by mouth daily.    . fluticasone (FLONASE) 50 MCG/ACT nasal spray Place 2 sprays into both nostrils daily.    Marland Kitchen gabapentin (NEURONTIN) 800 MG tablet Take 800 mg by  mouth in the morning, at noon, in the evening, and at bedtime.    . hydrALAZINE (APRESOLINE) 25 MG tablet Take 25 mg by mouth in the morning and at bedtime.    . isosorbide mononitrate (IMDUR) 60 MG 24 hr tablet Take 60 mg by mouth daily.    Marland Kitchen levocetirizine (XYZAL) 5 MG tablet Take 5 mg by mouth every evening.    Marland Kitchen losartan (COZAAR) 25 MG tablet Take 25 mg by mouth 2 (two) times daily.    . metoprolol succinate (TOPROL-XL) 25 MG 24 hr tablet Take 25 mg by mouth daily.    . nitroGLYCERIN (NITROSTAT) 0.4 MG SL tablet Place 0.4 mg under the tongue every 5 (five) minutes as needed for chest pain.    . pantoprazole (PROTONIX) 40 MG tablet Take 40 mg by mouth daily.    . rosuvastatin (CRESTOR) 40 MG tablet Take 40 mg by mouth at bedtime.    . sertraline (ZOLOFT) 25 MG tablet Take 25 mg by mouth 3 (three) times daily.     Marland Kitchen spironolactone (ALDACTONE) 25 MG tablet Take 12.5 mg by mouth  daily.    Marland Kitchen tetrahydrozoline-zinc (VISINE-AC) 0.05-0.25 % ophthalmic solution Place 2 drops into both eyes 3 (three) times daily as needed (dry eyes).    . Vitamin D, Ergocalciferol, 50000 units CAPS Take 1 capsule by mouth once a week.     No current facility-administered medications for this visit.    Allergies:   Iodinated contrast media, Ibuprofen, Lubiprostone, Sulfa antibiotics, Zolpidem, Boniva [ibandronic acid], Celecoxib, Doxycycline, Duloxetine, and Tetracyclines & related    Social History:   reports that she has never smoked. She has never used smokeless tobacco. She reports that she does not drink alcohol and does not use drugs.   Family History:  family history includes Acute myelogenous leukemia in her sister; Breast cancer in an other family member; Cancer in her sister; Colon cancer (age of onset: 81) in her mother; HIV/AIDS (age of onset: 32) in her brother; Melanoma in her brother, brother, niece, and another family member; Prostate cancer in her brother, brother, and father; Tuberculosis in her paternal aunt.   ROS:     Review of Systems  Constitutional: Negative.   HENT: Negative.    Eyes: Negative.   Respiratory: Negative.    Gastrointestinal: Negative.   Genitourinary: Negative.   Musculoskeletal: Negative.   Skin: Negative.   Neurological:  Positive for dizziness.  Endo/Heme/Allergies: Negative.   Psychiatric/Behavioral: Negative.    All other systems reviewed and are negative.   All other systems are reviewed and negative.    PHYSICAL EXAM: VS:  BP (!) 126/58 (BP Location: Right Arm)   Pulse 69   Ht 5' 11"$  (1.803 m)   Wt 190 lb (86.2 kg)   SpO2 95%   BMI 26.50 kg/m  , BMI Body mass index is 26.5 kg/m. Last weight:  Wt Readings from Last 3 Encounters:  11/15/22 190 lb (86.2 kg)  11/02/22 186 lb 4.8 oz (84.5 kg)  08/04/22 207 lb 4.8 oz (94 kg)   Physical Exam Constitutional:      Appearance: Normal appearance.  Cardiovascular:     Rate and  Rhythm: Normal rate and regular rhythm.     Heart sounds: Normal heart sounds.  Pulmonary:     Effort: Pulmonary effort is normal.     Breath sounds: Normal breath sounds.  Musculoskeletal:     Right lower leg: No edema.     Left lower leg: No  edema.  Neurological:     Mental Status: She is alert.      EKG:   Recent Labs: 10/31/2022: ALT 10; B Natriuretic Peptide 1,191.7 11/01/2022: BUN 13; Creatinine, Ser 0.81; Hemoglobin 11.8; Magnesium 1.9; Platelets 206; Potassium 3.7; Sodium 140    Lipid Panel No results found for: "CHOL", "TRIG", "HDL", "CHOLHDL", "VLDL", "LDLCALC", "LDLDIRECT"    Other studies Reviewed: Additional studies/ records that were reviewed today include:  Review of the above records demonstrates:  REASON FOR VISIT  Visit for: Echocardiogram/I48.91  Sex:   Female  wt=203    lbs.  BP=130/60  Height= 71   inches.        TESTS  Imaging: Echocardiogram:  An echocardiogram in (2-d) mode was performed and in Doppler mode with color flow velocity mapping was performed. The aortic valve cusps are abnormal 1.4   cm, flow velocity 0000000  m/s, and systolic calculated mean flow gradient 7  mmHg. Mitral valve diastolic peak flow velocity E .578   m/s and E/A ratio 0.4. Aortic root diameter 3.4   cm. The LVOT internal diameter 3.1 cm and flow velocity was abnormal .79  m/s. LV systolic dimension 123XX123   cm, diastolic 123456  cm, posterior wall thickness 1.2  cm, fractional shortening 29.6  %, and EF 56.8 %. IVS thickness 1.47  cm. LA dimension 5.4 cm. Tricuspid Valve has Mild to Moderate Regurgitation. Aortic Valve has Trace Regurgitation. Mitral Valve has Mild to Moderate Regurgitation.     ASSESSMENT  Technically difficult study due to body habitus.  Normal chamber sizes.  Mild left ventricular hypertrophy with GRADE 2 (psuedonormalization) diastolic dysfunction.  Normal right ventricular systolic function.  Normal right ventricular diastolic function.  Normal  left ventricular wall motion.  Normal right ventricular wall motion.  Mild to Moderate tricuspid regurgitation.  Severe pulmonary hypertension.  Mild to Moderate  mitral regurgitation.  Trace aortic regurgitation.  No pericardial effusion.  Severely dilated Left atrium  Moderately dialted Right atrium  Moderately dialted Right venticle  Mild LVH.     THERAPY  . Referring physician: Dionisio David  Sonographer: Neoma Laming.   REASON FOR VISIT  Referred by Dr.Shariff Lasky Humphrey Rolls.        TESTS  Imaging: Computed Tomographic Angiography:  Cardiac multidetector CT was performed paying particular attention to the coronary arteries for the diagnosis of: Chest Pain. Diagnostic Drugs:  Administered iohexol (Omnipaque) through an antecubital vein and images from the examination were analyzed for the presence and extent of coronary artery disease, using 3D image processing software. 100 mL of non-ionic contrast (Omnipaque) was used.        TEST CONCLUSIONS  Quality of study; [place].  1-Calcium score: 523.3  2-Right dominant system.  3-Moderate disease in mid LAD (40%-50%). LCX and RCA are normal. Treat medically.   Neoma Laming MD  Electronically signed by: Neoma Laming     Date: 04/17/2021 15:08 Neoma Laming MD  Electronically signed by: Neoma Laming     Date: 10/19/2022 09:35      No data to display            ASSESSMENT AND PLAN:    ICD-10-CM   1. Primary hypertension  I10     2. PAF (paroxysmal atrial fibrillation) (HCC)  I48.0     3. Orthostatic hypotension  I95.1     4. Dizziness  R42    Patient is dizzy with orthostatic changes and Diastolic BP 62, thus will change aldactone to 12.5 po daily  5. NSTEMI (non-ST elevated myocardial infarction) [facility])  I21.4    Patient was admitted to [facility] with NSTEMI, but no obstructive CAD on cardiac cath was found.       Problem List Items Addressed This Visit       Cardiovascular and Mediastinum   Hypertension -  Primary   Relevant Medications   ezetimibe (ZETIA) 10 MG tablet   PAF (paroxysmal atrial fibrillation) (HCC)   Relevant Medications   ezetimibe (ZETIA) 10 MG tablet   Orthostatic hypotension   Relevant Medications   ezetimibe (ZETIA) 10 MG tablet   NSTEMI (non-ST elevated myocardial infarction) (HCC)   Relevant Medications   ezetimibe (ZETIA) 10 MG tablet     Other   Dizziness       Disposition:   No follow-ups on file.      Signed,  Neoma Laming, MD  11/15/2022 9:38 AM    [facility]

## 2022-11-16 ENCOUNTER — Ambulatory Visit: Payer: Medicare PPO | Admitting: Cardiovascular Disease

## 2022-11-25 ENCOUNTER — Ambulatory Visit (INDEPENDENT_AMBULATORY_CARE_PROVIDER_SITE_OTHER): Payer: Medicare PPO | Admitting: Nurse Practitioner

## 2022-11-25 ENCOUNTER — Encounter: Payer: Self-pay | Admitting: Nurse Practitioner

## 2022-11-25 VITALS — BP 130/58 | HR 69 | Ht 71.0 in | Wt 190.0 lb

## 2022-11-25 DIAGNOSIS — J301 Allergic rhinitis due to pollen: Secondary | ICD-10-CM

## 2022-11-25 DIAGNOSIS — R42 Dizziness and giddiness: Secondary | ICD-10-CM | POA: Diagnosis not present

## 2022-11-25 DIAGNOSIS — G629 Polyneuropathy, unspecified: Secondary | ICD-10-CM

## 2022-11-25 DIAGNOSIS — N189 Chronic kidney disease, unspecified: Secondary | ICD-10-CM

## 2022-11-25 DIAGNOSIS — I10 Essential (primary) hypertension: Secondary | ICD-10-CM

## 2022-11-25 NOTE — Patient Instructions (Signed)
1) Drink more water to prevent dizziness 2) Eat more protein for cell restoration and energy 3) Followup appt in 4 months, RTC 3 days prior

## 2022-11-25 NOTE — Progress Notes (Signed)
Established Patient Office Visit  Subjective:  Patient ID: Sarah Riddle, female    DOB: October 20, 1940  Age: 82 y.o. MRN: [id number]  Chief Complaint  Patient presents with   Follow-up    Follow Up    Pt here today for hospital follow up and has already seen Cards.       Past Medical History:  Diagnosis Date   A-fib [facility])    Accelerated hypertension 10/31/2022   Age related osteoporosis    Cancer ([place])    Melanoma x 2 -- Lt Leg -1976 Rt Arm - 2014   Chronic bilateral low back pain    Chronic kidney disease    Complication of anesthesia    DVT (deep venous thrombosis) ([place]) 1960   right leg  was on BC pills   Dysrhythmia    Family history of breast cancer    Family history of colon cancer    Family history of melanoma    Family history of prostate cancer    Fibrocystic breast disease    GERD (gastroesophageal reflux disease)    Headache    migraine   Heart murmur    not heard now.   History of kidney stones    Hyperlipidemia    Hypertension    IBS (irritable bowel syndrome)    Lumbago    Mitral valve prolapse    Neuropathy    Obstructive sleep apnea    Osteoarthritis of shoulder    PONV (postoperative nausea and vomiting)     Social History   Socioeconomic History   Marital status: Married    Spouse name: Not on file   Number of children: Not on file   Years of education: Not on file   Highest education level: Not on file  Occupational History   Not on file  Tobacco Use   Smoking status: Never   Smokeless tobacco: Never  Vaping Use   Vaping Use: Never used  Substance and Sexual Activity   Alcohol use: No   Drug use: No   Sexual activity: Not on file  Other Topics Concern   Not on file  Social History Narrative   Not on file   Social Determinants of Health   Financial Resource Strain: Not on file  Food Insecurity: No Food Insecurity (10/31/2022)   Hunger Vital Sign    Worried About Running Out of Food in the Last Year: Never true    Ran  Out of Food in the Last Year: Never true  Transportation Needs: No Transportation Needs (10/31/2022)   PRAPARE - Hydrologist (Medical): No    Lack of Transportation (Non-Medical): No  Physical Activity: Not on file  Stress: Not on file  Social Connections: Not on file  Intimate Partner Violence: Not At Risk (10/31/2022)   Humiliation, Afraid, Rape, and Kick questionnaire    Fear of Current or Ex-Partner: No    Emotionally Abused: No    Physically Abused: No    Sexually Abused: No    Family History  Problem Relation Age of Onset   Colon cancer Mother 14   Prostate cancer Father        dx 23s   Cancer Sister    Acute myelogenous leukemia Sister        dx 109s   Prostate cancer Brother    Melanoma Brother    Prostate cancer Brother    Melanoma Brother    HIV/AIDS Brother 49   Tuberculosis Paternal  Aunt    Melanoma Other    Breast cancer Other    Melanoma Niece     Allergies  Allergen Reactions   Iodinated Contrast Media Shortness Of Breath    Can be premedicated   Ibuprofen Other (See Comments)    "stomach problems"    Lubiprostone Nausea And Vomiting        Sulfa Antibiotics Other (See Comments)    "Stomach problems"    Zolpidem Other (See Comments)    "hallucinations"  Other reaction(s): Confusion  Other Reaction(s): confusion   Boniva [Ibandronic Acid] Diarrhea and Nausea And Vomiting   Celecoxib Other (See Comments)    GI upset    Doxycycline Diarrhea and Nausea And Vomiting   Duloxetine Other (See Comments)    Over sedation with Zelnorm (IBS med)   Tetracyclines & Related Nausea Only and Nausea And Vomiting    Review of Systems  Constitutional: Negative.   HENT: Negative.    Eyes: Negative.   Respiratory: Negative.    Cardiovascular: Negative.   Gastrointestinal: Negative.   Genitourinary: Negative.   Musculoskeletal:  Positive for myalgias.  Skin: Negative.   Neurological: Negative.   Endo/Heme/Allergies:  Negative.   Psychiatric/Behavioral:  Positive for depression.        Objective:   BP (!) 130/58   Pulse 69   Ht 5' 11"$  (1.803 m)   Wt 190 lb (86.2 kg)   SpO2 95%   BMI 26.50 kg/m   Vitals:   11/25/22 0907  BP: (!) 130/58  Pulse: 69  Height: 5' 11"$  (1.803 m)  Weight: 190 lb (86.2 kg)  SpO2: 95%  BMI (Calculated): 26.51    Physical Exam Constitutional:      Appearance: Normal appearance.  HENT:     Head: Normocephalic.     Nose: Nose normal.     Mouth/Throat:     Mouth: Mucous membranes are moist.  Eyes:     Pupils: Pupils are equal, round, and reactive to light.  Cardiovascular:     Rate and Rhythm: Normal rate and regular rhythm.  Pulmonary:     Effort: Pulmonary effort is normal.     Breath sounds: Normal breath sounds.  Abdominal:     General: Bowel sounds are normal.     Palpations: Abdomen is soft.  Musculoskeletal:        General: Tenderness present.     Cervical back: Normal range of motion and neck supple.  Skin:    General: Skin is warm and dry.  Neurological:     Mental Status: She is alert and oriented to person, place, and time.  Psychiatric:        Mood and Affect: Mood normal.        Behavior: Behavior normal.      No results found for any visits on 11/25/22.  Recent Results (from the past 2160 hour(s))  Basic metabolic panel     Status: Abnormal   Collection Time: 10/30/22  8:16 PM  Result Value Ref Range   Sodium 140 135 - 145 mmol/L   Potassium 3.2 (L) 3.5 - 5.1 mmol/L    Comment: HEMOLYSIS AT THIS LEVEL MAY AFFECT RESULT   Chloride 103 98 - 111 mmol/L   CO2 26 22 - 32 mmol/L   Glucose, Bld 107 (H) 70 - 99 mg/dL    Comment: Glucose reference range applies only to samples taken after fasting for at least 8 hours.   BUN 10 8 - 23 mg/dL   Creatinine,  Ser 0.64 0.44 - 1.00 mg/dL   Calcium 9.7 8.9 - 10.3 mg/dL   GFR, Estimated >60 >60 mL/min    Comment: (NOTE) Calculated using the CKD-EPI Creatinine Equation (2021)    Anion gap  11 5 - 15    Comment: Performed at [facility], [address] [place], [place] [address]  CBC     Status: None   Collection Time: 10/30/22  8:16 PM  Result Value Ref Range   WBC 6.1 4.0 - 10.5 K/uL   RBC 4.44 3.87 - 5.11 MIL/uL   Hemoglobin 12.9 12.0 - 15.0 g/dL   HCT 40.2 36.0 - 46.0 %   MCV 90.5 80.0 - 100.0 fL   MCH 29.1 26.0 - 34.0 pg   MCHC 32.1 30.0 - 36.0 g/dL   RDW 14.3 11.5 - 15.5 %   Platelets 195 150 - 400 K/uL   nRBC 0.0 0.0 - 0.2 %    Comment: Performed at [facility], [address] [place], [place] [address]  Troponin I (High Sensitivity)     Status: Abnormal   Collection Time: 10/30/22  8:16 PM  Result Value Ref Range   Troponin I (High Sensitivity) 119 (HH) <18 ng/L    Comment: CRITICAL RESULT CALLED TO, READ BACK BY AND VERIFIED WITH Abran Richard AT 2106 10/30/2022 DLB (NOTE) Elevated high sensitivity troponin I (hsTnI) values and significant  changes across serial measurements may suggest ACS but many other  chronic and acute conditions are known to elevate hsTnI results.  Refer to the "Links" section for chest pain algorithms and additional  guidance. Performed at [facility], [place], [place] [address]   Troponin I (High Sensitivity)     Status: Abnormal   Collection Time: 10/30/22 10:37 PM  Result Value Ref Range   Troponin I (High Sensitivity) 642 (HH) <18 ng/L    Comment: CRITICAL VALUE NOTED. VALUE IS CONSISTENT WITH PREVIOUSLY REPORTED/CALLED VALUE DLB (NOTE) Elevated high sensitivity troponin I (hsTnI) values and significant  changes across serial measurements may suggest ACS but many other  chronic and acute conditions are known to elevate hsTnI results.  Refer to the "Links" section for chest pain algorithms and additional  guidance. Performed at [facility], [address] [place], [place] [address]   APTT     Status: Abnormal   Collection Time: 10/31/22 12:12 AM  Result  Value Ref Range   aPTT 47 (H) 24 - 36 seconds    Comment:        IF BASELINE aPTT IS ELEVATED, SUGGEST PATIENT RISK ASSESSMENT BE USED TO DETERMINE APPROPRIATE ANTICOAGULANT THERAPY. Performed at [facility], [address] [place], [place] [address]   Protime-INR     Status: None   Collection Time: 10/31/22 12:12 AM  Result Value Ref Range   Prothrombin Time 14.6 11.4 - 15.2 seconds   INR 1.2 0.8 - 1.2    Comment: (NOTE) INR goal varies based on device and disease states. Performed at [facility], [place],  [address]   Heparin level (unfractionated)     Status: Abnormal   Collection Time: 10/31/22 12:12 AM  Result Value Ref Range   Heparin Unfractionated >1.10 (H) 0.30 - 0.70 IU/mL    Comment: (NOTE) The clinical reportable range upper limit is being lowered to >1.10 to align with the FDA approved guidance for the current laboratory assay.  If heparin results are below expected values, and patient dosage  has  been confirmed, suggest follow up testing of antithrombin III levels. Performed at [facility], [address] [place], [place] [address]   Hemoglobin A1c     Status: None   Collection Time: 10/31/22  5:42 AM  Result Value Ref Range   Hgb A1c MFr Bld 5.3 4.8 - 5.6 %    Comment: (NOTE) Pre diabetes:          5.7%-6.4%  Diabetes:              >6.4%  Glycemic control for   <7.0% adults with diabetes    Mean Plasma Glucose 105.41 mg/dL    Comment: Performed at [facility] [address] [place], [place] [address]  Comprehensive metabolic panel     Status: Abnormal   Collection Time: 10/31/22  5:42 AM  Result Value Ref Range   Sodium 137 135 - 145 mmol/L   Potassium 3.3 (L) 3.5 - 5.1 mmol/L   Chloride 107 98 - 111 mmol/L   CO2 26 22 - 32 mmol/L   Glucose, Bld 177 (H) 70 - 99 mg/dL    Comment: Glucose reference range applies only to samples taken after fasting for at least 8 hours.   BUN 9 8 -  23 mg/dL   Creatinine, Ser 0.63 0.44 - 1.00 mg/dL   Calcium 9.1 8.9 - 10.3 mg/dL   Total Protein 6.1 (L) 6.5 - 8.1 g/dL   Albumin 3.5 3.5 - 5.0 g/dL   AST 25 15 - 41 U/L   ALT 10 0 - 44 U/L   Alkaline Phosphatase 60 38 - 126 U/L   Total Bilirubin 1.0 0.3 - 1.2 mg/dL   GFR, Estimated >60 >60 mL/min    Comment: (NOTE) Calculated using the CKD-EPI Creatinine Equation (2021)    Anion gap 4 (L) 5 - 15    Comment: Performed at [facility], [address] [place], [place] [address]  CBC     Status: Abnormal   Collection Time: 10/31/22  5:42 AM  Result Value Ref Range   WBC 4.7 4.0 - 10.5 K/uL   RBC 4.12 3.87 - 5.11 MIL/uL   Hemoglobin 11.8 (L) 12.0 - 15.0 g/dL   HCT 37.2 36.0 - 46.0 %   MCV 90.3 80.0 - 100.0 fL   MCH 28.6 26.0 - 34.0 pg   MCHC 31.7 30.0 - 36.0 g/dL   RDW 14.0 11.5 - 15.5 %   Platelets 206 150 - 400 K/uL   nRBC 0.0 0.0 - 0.2 %    Comment: Performed at [facility], [address] [place], [place] [address]  Troponin I (High Sensitivity)     Status: Abnormal   Collection Time: 10/31/22  5:42 AM  Result Value Ref Range   Troponin I (High Sensitivity) 811 (HH) <18 ng/L    Comment: CRITICAL VALUE NOTED. VALUE IS CONSISTENT WITH PREVIOUSLY REPORTED/CALLED VALUE DLB (NOTE) Elevated high sensitivity troponin I (hsTnI) values and significant  changes across serial measurements may suggest ACS but many other  chronic and acute conditions are known to elevate hsTnI results.  Refer to the "Links" section for chest pain algorithms and additional  guidance. Performed at [facility], [address] [place], [place] [address]   Troponin I (High Sensitivity)     Status: Abnormal   Collection Time: 10/31/22  7:21 AM  Result Value Ref Range   Troponin I (High Sensitivity) 768 (HH) <18 ng/L    Comment: CRITICAL VALUE  NOTED. VALUE IS CONSISTENT WITH PREVIOUSLY REPORTED/CALLED VALUE MJU (NOTE) Elevated high sensitivity troponin I (hsTnI) values  and significant  changes across serial measurements may suggest ACS but many other  chronic and acute conditions are known to elevate hsTnI results.  Refer to the "Links" section for chest pain algorithms and additional  guidance. Performed at [facility], [address] [place], [place] [address]   CBG monitoring, ED     Status: Abnormal   Collection Time: 10/31/22  7:24 AM  Result Value Ref Range   Glucose-Capillary 150 (H) 70 - 99 mg/dL    Comment: Glucose reference range applies only to samples taken after fasting for at least 8 hours.  APTT     Status: Abnormal   Collection Time: 10/31/22  8:46 AM  Result Value Ref Range   aPTT 162 (HH) 24 - 36 seconds    Comment:        IF BASELINE aPTT IS ELEVATED, SUGGEST PATIENT RISK ASSESSMENT BE USED TO DETERMINE APPROPRIATE ANTICOAGULANT THERAPY. CRITICAL RESULT CALLED TO, READ BACK BY AND VERIFIED WITH: KRISTEN MEVILL 10/31/22 1110 SB Performed at [facility], [address] [place], [place] [address]   Brain natriuretic peptide     Status: Abnormal   Collection Time: 10/31/22  9:43 AM  Result Value Ref Range   B Natriuretic Peptide 1,191.7 (H) 0.0 - 100.0 pg/mL    Comment: Performed at [facility], [address] [place], [place] [address]  APTT     Status: Abnormal   Collection Time: 10/31/22 11:18 AM  Result Value Ref Range   aPTT >200 (HH) 24 - 36 seconds    Comment:        IF BASELINE aPTT IS ELEVATED, SUGGEST PATIENT RISK ASSESSMENT BE USED TO DETERMINE APPROPRIATE ANTICOAGULANT THERAPY. CRITICAL RESULT CALLED TO, READ BACK BY AND VERIFIED WITH: MELISSA RUNGE @ 1250 ON 10/31/2022 BY CAF Performed at [facility], [address] [place], [place] [address]   MRSA Next Gen by PCR, Nasal     Status: None   Collection Time: 10/31/22  2:58 PM   Specimen: Nasal Mucosa; Nasal Swab  Result Value Ref Range   MRSA by PCR Next Gen NOT DETECTED NOT DETECTED    Comment: (NOTE) The  GeneXpert MRSA Assay (FDA approved for NASAL specimens only), is one component of a comprehensive MRSA colonization surveillance program. It is not intended to diagnose MRSA infection nor to guide or monitor treatment for MRSA infections. Test performance is not FDA approved in patients less than 45 years old. Performed at [facility], [address] [place],  [address]   Glucose, capillary     Status: Abnormal   Collection Time: 10/31/22  3:03 PM  Result Value Ref Range   Glucose-Capillary 130 (H) 70 - 99 mg/dL    Comment: Glucose reference range applies only to samples taken after fasting for at least 8 hours.  APTT     Status: Abnormal   Collection Time: 10/31/22  8:17 PM  Result Value Ref Range   aPTT 60 (H) 24 - 36 seconds    Comment:        IF BASELINE aPTT IS ELEVATED, SUGGEST PATIENT RISK ASSESSMENT BE USED TO DETERMINE APPROPRIATE ANTICOAGULANT THERAPY. Performed at [facility], [address] [place],  [address]   Glucose, capillary     Status: Abnormal   Collection Time: 10/31/22  9:06 PM  Result Value Ref Range   Glucose-Capillary  114 (H) 70 - 99 mg/dL    Comment: Glucose reference range applies only to samples taken after fasting for at least 8 hours.  CBC     Status: Abnormal   Collection Time: 11/01/22  3:18 AM  Result Value Ref Range   WBC 6.4 4.0 - 10.5 K/uL   RBC 4.09 3.87 - 5.11 MIL/uL   Hemoglobin 11.8 (L) 12.0 - 15.0 g/dL   HCT 36.5 36.0 - 46.0 %   MCV 89.2 80.0 - 100.0 fL   MCH 28.9 26.0 - 34.0 pg   MCHC 32.3 30.0 - 36.0 g/dL   RDW 14.6 11.5 - 15.5 %   Platelets 206 150 - 400 K/uL   nRBC 0.0 0.0 - 0.2 %    Comment: Performed at [facility], [address] [place], [place] [id number]  Basic metabolic panel     Status: Abnormal   Collection Time: 11/01/22  3:18 AM  Result Value Ref Range   Sodium 140 135 - 145 mmol/L   Potassium 3.7 3.5 - 5.1 mmol/L   Chloride 102 98 - 111 mmol/L   CO2 27 22 - 32  mmol/L   Glucose, Bld 149 (H) 70 - 99 mg/dL    Comment: Glucose reference range applies only to samples taken after fasting for at least 8 hours.   BUN 13 8 - 23 mg/dL   Creatinine, Ser 0.81 0.44 - 1.00 mg/dL   Calcium 9.6 8.9 - 10.3 mg/dL   GFR, Estimated >60 >60 mL/min    Comment: (NOTE) Calculated using the CKD-EPI Creatinine Equation (2021)    Anion gap 11 5 - 15    Comment: Performed at [facility], [address] [place], [place] [address]  Magnesium     Status: None   Collection Time: 11/01/22  3:18 AM  Result Value Ref Range   Magnesium 1.9 1.7 - 2.4 mg/dL    Comment: Performed at [facility], [address] [place], [place] [address]  APTT     Status: Abnormal   Collection Time: 11/01/22  3:18 AM  Result Value Ref Range   aPTT 123 (H) 24 - 36 seconds    Comment:        IF BASELINE aPTT IS ELEVATED, SUGGEST PATIENT RISK ASSESSMENT BE USED TO DETERMINE APPROPRIATE ANTICOAGULANT THERAPY. Performed at [facility], [place], [place] [address]   Heparin level (unfractionated)     Status: Abnormal   Collection Time: 11/01/22  3:18 AM  Result Value Ref Range   Heparin Unfractionated 0.74 (H) 0.30 - 0.70 IU/mL    Comment: (NOTE) The clinical reportable range upper limit is being lowered to >1.10 to align with the FDA approved guidance for the current laboratory assay.  If heparin results are below expected values, and patient dosage has  been confirmed, suggest follow up testing of antithrombin III levels. Performed at [facility], [address] [place],  [address]   Glucose, capillary     Status: Abnormal   Collection Time: 11/01/22 10:43 AM  Result Value Ref Range   Glucose-Capillary 117 (H) 70 - 99 mg/dL    Comment: Glucose reference range applies only to samples taken after fasting for at least 8 hours.  Glucose, capillary     Status: Abnormal   Collection Time: 11/01/22  2:31 PM  Result Value  Ref Range   Glucose-Capillary 213 (H) 70 - 99 mg/dL    Comment: Glucose reference range applies only to  samples taken after fasting for at least 8 hours.  Glucose, capillary     Status: Abnormal   Collection Time: 11/01/22  4:04 PM  Result Value Ref Range   Glucose-Capillary 130 (H) 70 - 99 mg/dL    Comment: Glucose reference range applies only to samples taken after fasting for at least 8 hours.  Glucose, capillary     Status: Abnormal   Collection Time: 11/01/22  8:59 PM  Result Value Ref Range   Glucose-Capillary 106 (H) 70 - 99 mg/dL    Comment: Glucose reference range applies only to samples taken after fasting for at least 8 hours.  Lipoprotein A (LPA)     Status: Abnormal   Collection Time: 11/02/22  4:14 AM  Result Value Ref Range   Lipoprotein (a) 127.0 (H) <75.0 nmol/L    Comment: (NOTE) Note:  Values greater than or equal to 75.0 nmol/L may       indicate an independent risk factor for CHD,       but must be evaluated with caution when applied       to non-Caucasian populations due to the       influence of genetic factors on Lp(a) across       ethnicities. Performed At: [facility] [place], [place] [id number] Rush Farmer MD [id number]   Glucose, capillary     Status: None   Collection Time: 11/02/22  8:24 AM  Result Value Ref Range   Glucose-Capillary 77 70 - 99 mg/dL    Comment: Glucose reference range applies only to samples taken after fasting for at least 8 hours.      Assessment & Plan:   Problem List Items Addressed This Visit       Cardiovascular and Mediastinum   Hypertension - Primary     Respiratory   Allergic rhinitis     Nervous and Auditory   Neuropathy (Chronic)     Genitourinary   Chronic kidney disease     Other   Dizziness    Return in about 4 months (around 03/26/2023) for Follow up appt in 4 months, RTC 3 days prior for fasting labs .   Total time spent: 30 minutes  Evern Bio,  NP  11/25/2022

## 2022-11-29 ENCOUNTER — Encounter: Payer: Self-pay | Admitting: Cardiovascular Disease

## 2022-12-02 DIAGNOSIS — L570 Actinic keratosis: Secondary | ICD-10-CM | POA: Diagnosis not present

## 2022-12-02 DIAGNOSIS — D0439 Carcinoma in situ of skin of other parts of face: Secondary | ICD-10-CM | POA: Diagnosis not present

## 2022-12-02 DIAGNOSIS — D485 Neoplasm of uncertain behavior of skin: Secondary | ICD-10-CM | POA: Diagnosis not present

## 2022-12-02 DIAGNOSIS — X32XXXA Exposure to sunlight, initial encounter: Secondary | ICD-10-CM | POA: Diagnosis not present

## 2022-12-06 ENCOUNTER — Ambulatory Visit: Payer: Medicare PPO

## 2022-12-09 DIAGNOSIS — J9601 Acute respiratory failure with hypoxia: Secondary | ICD-10-CM | POA: Diagnosis not present

## 2022-12-09 DIAGNOSIS — G4733 Obstructive sleep apnea (adult) (pediatric): Secondary | ICD-10-CM | POA: Diagnosis not present

## 2022-12-13 ENCOUNTER — Ambulatory Visit: Payer: Medicare PPO | Admitting: Cardiovascular Disease

## 2022-12-13 ENCOUNTER — Encounter: Payer: Self-pay | Admitting: Cardiovascular Disease

## 2022-12-13 VITALS — BP 128/64 | HR 66 | Ht 70.0 in | Wt 191.0 lb

## 2022-12-13 DIAGNOSIS — I480 Paroxysmal atrial fibrillation: Secondary | ICD-10-CM

## 2022-12-13 DIAGNOSIS — I214 Non-ST elevation (NSTEMI) myocardial infarction: Secondary | ICD-10-CM

## 2022-12-13 DIAGNOSIS — I10 Essential (primary) hypertension: Secondary | ICD-10-CM

## 2022-12-13 MED ORDER — SPIRONOLACTONE 25 MG PO TABS: 25.0000 mg | ORAL_TABLET | Freq: Two times a day (BID) | ORAL | 11 refills | Status: DC

## 2022-12-13 NOTE — Progress Notes (Signed)
Cardiology Office Note   Date:  12/13/2022   ID:  Sarah Riddle, Sarah Riddle [birth year], MRN [id number]  PCP:  Perrin Maltese, MD  Cardiologist:  Neoma Laming, MD      History of Present Illness: Sarah Riddle is a 82 y.o. female who presents for  Chief Complaint  Patient presents with   Follow-up    4 weeks follow up    Dizziness This is a chronic problem. The current episode started in the past 7 days. The problem has been unchanged.      Past Medical History:  Diagnosis Date   A-fib [facility])    Accelerated hypertension 10/31/2022   Age related osteoporosis    Cancer ([place])    Melanoma x 2 -- Lt Leg -1976 Rt Arm - 2014   Chronic bilateral low back pain    Chronic kidney disease    Complication of anesthesia    DVT (deep venous thrombosis) ([place]) 1960   right leg  was on BC pills   Dysrhythmia    Family history of breast cancer    Family history of colon cancer    Family history of melanoma    Family history of prostate cancer    Fibrocystic breast disease    GERD (gastroesophageal reflux disease)    Headache    migraine   Heart murmur    not heard now.   History of kidney stones    Hyperlipidemia    Hypertension    IBS (irritable bowel syndrome)    Lumbago    Mitral valve prolapse    Neuropathy    Obstructive sleep apnea    Osteoarthritis of shoulder    PONV (postoperative nausea and vomiting)      Past Surgical History:  Procedure Laterality Date   ABDOMINAL HYSTERECTOMY     BACK SURGERY     BREAST CYST ASPIRATION Left 2001   Negative   CARDIAC ELECTROPHYSIOLOGY MAPPING AND ABLATION     EYE SURGERY     HUMERUS IM NAIL Right 05/05/2022   Procedure: INTRAMEDULLARY (IM) NAIL HUMERAL;  Surgeon: Shona Needles, MD;  Location: [facility];  Service: Orthopedics;  Laterality: Right;   HYSTEROTOMY     L4-L5  2020   titaum rebok cage   LEFT HEART CATH AND CORONARY ANGIOGRAPHY N/A 11/01/2022   Procedure: LEFT HEART CATH AND CORONARY ANGIOGRAPHY and possible  PCI and stent;  Surgeon: Dionisio David, MD;  Location: [facility];  Service: Cardiovascular;  Laterality: N/A;   TOTAL HIP ARTHROPLASTY Left      Current Outpatient Medications  Medication Sig Dispense Refill   spironolactone (ALDACTONE) 25 MG tablet Take 1 tablet (25 mg total) by mouth 2 (two) times daily. 60 tablet 11   acetaminophen (TYLENOL) 500 MG tablet Take 500 mg by mouth every 6 (six) hours as needed. Once a day     alendronate (FOSAMAX) 70 MG tablet Take 70 mg by mouth every Thursday. Take with a full glass of water on an empty stomach.     allopurinol (ZYLOPRIM) 100 MG tablet Take 100 mg by mouth daily.     Cranberry 125 MG TABS Take 125 mg by mouth daily.     cyanocobalamin (VITAMIN B12) 1000 MCG tablet Take 1,000 mcg by mouth daily. Monday to Friday     ELIQUIS 5 MG TABS tablet TAKE 1 TABLET TWICE DAILY 60 tablet 3   EPINEPHrine (EPIPEN 2-PAK) 0.3 mg/0.3 mL IJ SOAJ injection Inject 0.3  mg into the muscle as needed for anaphylaxis. (Patient not taking: Reported on 12/06/2022)     ezetimibe (ZETIA) 10 MG tablet Take 10 mg by mouth daily. (Patient not taking: Reported on 12/06/2022)     fluticasone (FLONASE) 50 MCG/ACT nasal spray Place 2 sprays into both nostrils daily.     gabapentin (NEURONTIN) 800 MG tablet Take 800 mg by mouth in the morning, at noon, in the evening, and at bedtime.     hydrALAZINE (APRESOLINE) 25 MG tablet Take 25 mg by mouth in the morning and at bedtime.     isosorbide mononitrate (IMDUR) 60 MG 24 hr tablet Take 60 mg by mouth daily.     levocetirizine (XYZAL) 5 MG tablet Take 5 mg by mouth every evening.     losartan (COZAAR) 25 MG tablet Take 25 mg by mouth daily.     metoprolol succinate (TOPROL-XL) 25 MG 24 hr tablet Take 25 mg by mouth daily.     nitroGLYCERIN (NITROSTAT) 0.4 MG SL tablet Place 0.4 mg under the tongue every 5 (five) minutes as needed for chest pain.     pantoprazole (PROTONIX) 40 MG tablet Take 40 mg by mouth daily.      rosuvastatin (CRESTOR) 40 MG tablet Take 40 mg by mouth at bedtime.     sertraline (ZOLOFT) 25 MG tablet Take 25 mg by mouth 3 (three) times daily. 2 tabs in AM 1 HS     spironolactone (ALDACTONE) 25 MG tablet Take 12.5 mg by mouth daily.     tetrahydrozoline-zinc (VISINE-AC) 0.05-0.25 % ophthalmic solution Place 2 drops into both eyes 3 (three) times daily as needed (dry eyes).     Vitamin D, Ergocalciferol, 50000 units CAPS Take 1 capsule by mouth once a week.     No current facility-administered medications for this visit.    Allergies:   Iodinated contrast media, Ibuprofen, Lubiprostone, Sulfa antibiotics, Zolpidem, Boniva [ibandronic acid], Celecoxib, Doxycycline, Duloxetine, and Tetracyclines & related    Social History:   reports that she has never smoked. She has never used smokeless tobacco. She reports that she does not drink alcohol and does not use drugs.   Family History:  family history includes Acute myelogenous leukemia in her sister; Breast cancer in an other family member; Cancer in her sister; Colon cancer (age of onset: 67) in her mother; HIV/AIDS (age of onset: 38) in her brother; Melanoma in her brother, brother, niece, and another family member; Prostate cancer in her brother, brother, and father; Tuberculosis in her paternal aunt.    ROS:     Review of Systems  Constitutional: Negative.   HENT: Negative.    Eyes: Negative.   Respiratory: Negative.    Gastrointestinal: Negative.   Genitourinary: Negative.   Musculoskeletal: Negative.   Skin: Negative.   Neurological:  Positive for dizziness.  Endo/Heme/Allergies: Negative.   Psychiatric/Behavioral: Negative.    All other systems reviewed and are negative.     All other systems are reviewed and negative.    PHYSICAL EXAM: VS:  BP 128/64   Pulse 66   Ht '5\' 10"'$  (1.778 m)   Wt 191 lb (86.6 kg)   SpO2 94%   BMI 27.41 kg/m  , BMI Body mass index is 27.41 kg/m. Last weight:  Wt Readings from Last 3  Encounters:  12/13/22 191 lb (86.6 kg)  11/25/22 190 lb (86.2 kg)  11/15/22 190 lb (86.2 kg)     Physical Exam Constitutional:      Appearance: Normal  appearance.  Cardiovascular:     Rate and Rhythm: Normal rate and regular rhythm.     Heart sounds: Normal heart sounds.  Pulmonary:     Effort: Pulmonary effort is normal.     Breath sounds: Normal breath sounds.  Musculoskeletal:     Right lower leg: No edema.     Left lower leg: No edema.  Neurological:     Mental Status: She is alert.       EKG:   Recent Labs: 10/31/2022: ALT 10; B Natriuretic Peptide 1,191.7 11/01/2022: BUN 13; Creatinine, Ser 0.81; Hemoglobin 11.8; Magnesium 1.9; Platelets 206; Potassium 3.7; Sodium 140    Lipid Panel No results found for: "CHOL", "TRIG", "HDL", "CHOLHDL", "VLDL", "LDLCALC", "LDLDIRECT"    Other studies Reviewed: Additional studies/ records that were reviewed today include:  Review of the above records demonstrates:       No data to display            ASSESSMENT AND PLAN:    ICD-10-CM   1. Primary hypertension  I10     2. NSTEMI (non-ST elevated myocardial infarction) ([facility])  I21.4     3. PAF (paroxysmal atrial fibrillation) (HCC)  I48.0        Problem List Items Addressed This Visit       Cardiovascular and Mediastinum   PAF (paroxysmal atrial fibrillation) (HCC)   Relevant Medications   spironolactone (ALDACTONE) 25 MG tablet   HTN (hypertension) - Primary    Repeat BP is 170/70, and at home BP are elevated, thus will change to aldactone 25 or whole tab daily.      Relevant Medications   spironolactone (ALDACTONE) 25 MG tablet   NSTEMI (non-ST elevated myocardial infarction) (HCC)   Relevant Medications   spironolactone (ALDACTONE) 25 MG tablet       Disposition:   Return in about 4 weeks (around 01/10/2023).    Total time spent: 30 minutes  Signed,  Neoma Laming, MD  12/13/2022 9:48 AM    [place]

## 2022-12-13 NOTE — Assessment & Plan Note (Signed)
Repeat BP is 170/70, and at home BP are elevated, thus will change to aldactone 25 or whole tab daily.

## 2022-12-14 ENCOUNTER — Other Ambulatory Visit: Payer: Self-pay | Admitting: Cardiovascular Disease

## 2022-12-14 DIAGNOSIS — D649 Anemia, unspecified: Secondary | ICD-10-CM | POA: Diagnosis not present

## 2022-12-14 DIAGNOSIS — R159 Full incontinence of feces: Secondary | ICD-10-CM | POA: Diagnosis not present

## 2022-12-14 DIAGNOSIS — K219 Gastro-esophageal reflux disease without esophagitis: Secondary | ICD-10-CM | POA: Diagnosis not present

## 2022-12-14 DIAGNOSIS — K589 Irritable bowel syndrome without diarrhea: Secondary | ICD-10-CM | POA: Diagnosis not present

## 2022-12-14 DIAGNOSIS — Z800 Family history of malignant neoplasm of digestive organs: Secondary | ICD-10-CM | POA: Diagnosis not present

## 2022-12-14 DIAGNOSIS — K5900 Constipation, unspecified: Secondary | ICD-10-CM | POA: Diagnosis not present

## 2022-12-14 DIAGNOSIS — K760 Fatty (change of) liver, not elsewhere classified: Secondary | ICD-10-CM | POA: Diagnosis not present

## 2022-12-14 DIAGNOSIS — I480 Paroxysmal atrial fibrillation: Secondary | ICD-10-CM | POA: Diagnosis not present

## 2022-12-14 DIAGNOSIS — Z86010 Personal history of colonic polyps: Secondary | ICD-10-CM | POA: Diagnosis not present

## 2022-12-14 MED ORDER — SPIRONOLACTONE 25 MG PO TABS: 25.0000 mg | ORAL_TABLET | Freq: Every day | ORAL | 11 refills | Status: DC
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
## 2022-12-30 DIAGNOSIS — L509 Urticaria, unspecified: Secondary | ICD-10-CM | POA: Diagnosis not present

## 2022-12-30 DIAGNOSIS — J3089 Other allergic rhinitis: Secondary | ICD-10-CM | POA: Diagnosis not present

## 2022-12-30 DIAGNOSIS — J301 Allergic rhinitis due to pollen: Secondary | ICD-10-CM | POA: Diagnosis not present

## 2022-12-30 DIAGNOSIS — J3081 Allergic rhinitis due to animal (cat) (dog) hair and dander: Secondary | ICD-10-CM | POA: Diagnosis not present

## 2022-12-31 ENCOUNTER — Other Ambulatory Visit: Payer: Self-pay | Admitting: Nurse Practitioner

## 2022-12-31 IMAGING — US US LIVER ELASTOGRAPHY
1 series · 12 of 25 positions shown · non-contrast
Comparison: Ultrasound October 20, 2021

CLINICAL DATA: Alcoholic fatty liver disease.  Hemosiderosis.

EXAM:
US LIVER ELASTOGRAPHY
TECHNIQUE: Sonography of the liver was performed. In addition, ultrasound
elastography evaluation of the liver was performed. A region of
interest was placed within the right lobe of the liver. Following
application of a compressive sonographic pulse, tissue
compressibility was assessed. Multiple assessments were performed at
the selected site. Median tissue compressibility was determined.
Previously, hepatic stiffness was assessed by shear wave velocity.
Based on recently published Society of Radiologists in Ultrasound
consensus article, reporting is now recommended to be performed in
the SI units of pressure (kiloPascals) representing hepatic
stiffness/elasticity. The obtained result is compared to the
published reference standards. (cACLD = compensated Advanced Chronic
Liver Disease)

[Series 1: us elastography liver · 12 of 30 slices shown]
[im 2/30]
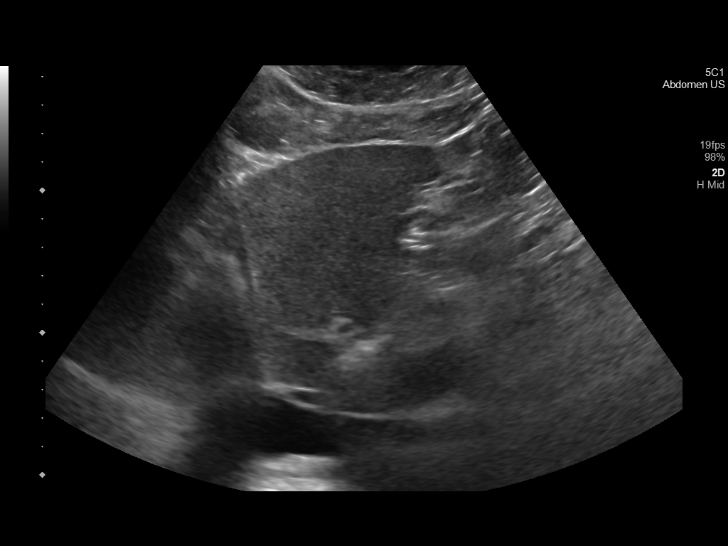
[im 4/30]
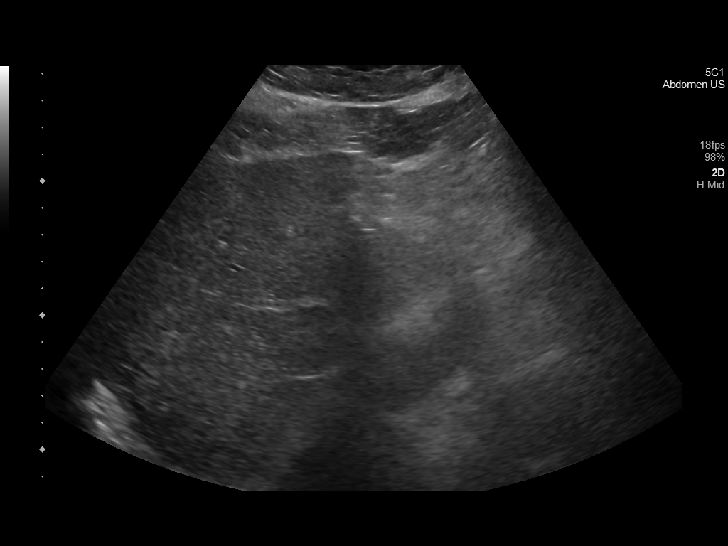
[im 7/30]
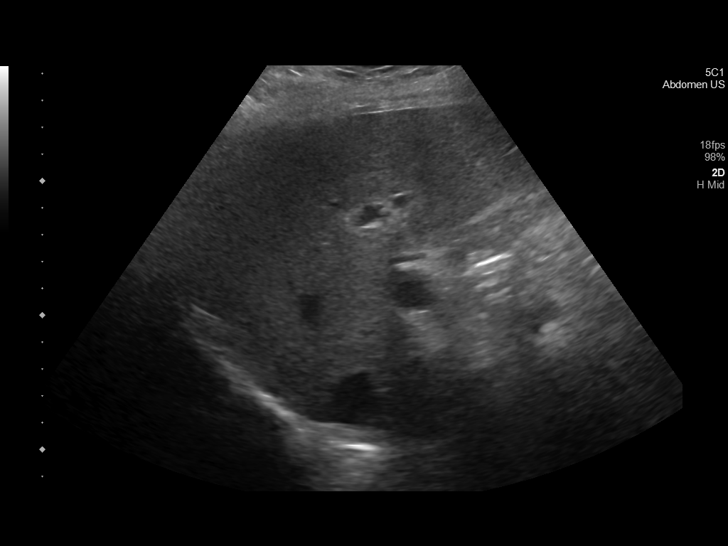
[im 9/30]
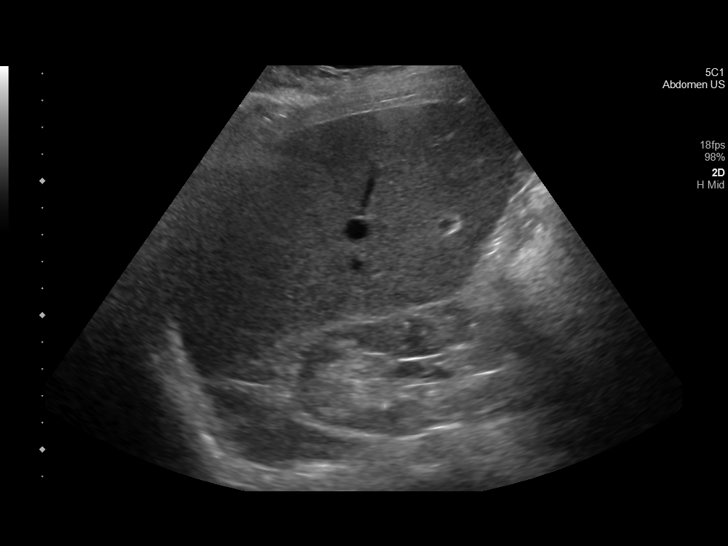
[im 11/30]
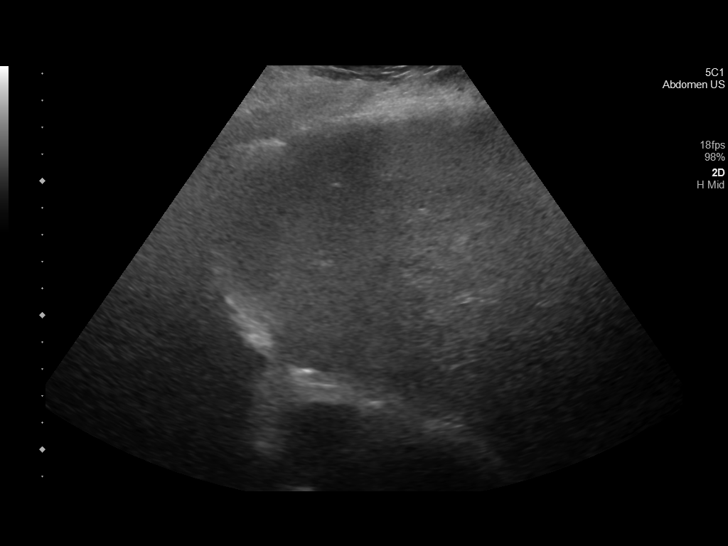
[im 14/30]
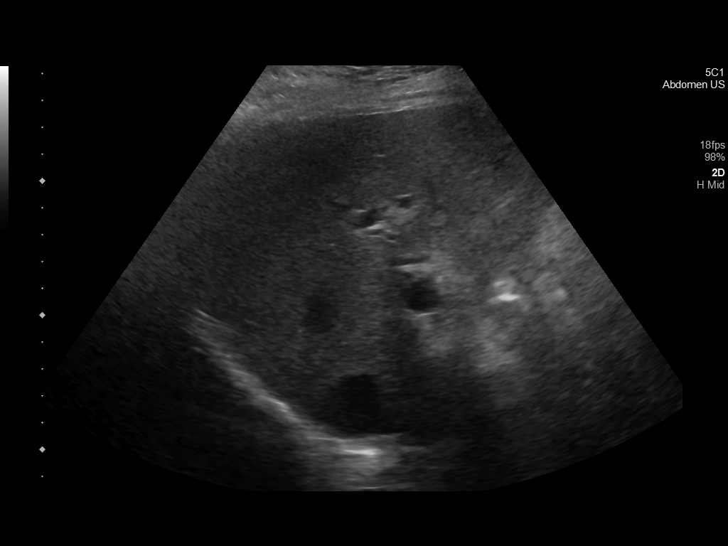
[im 16/30]
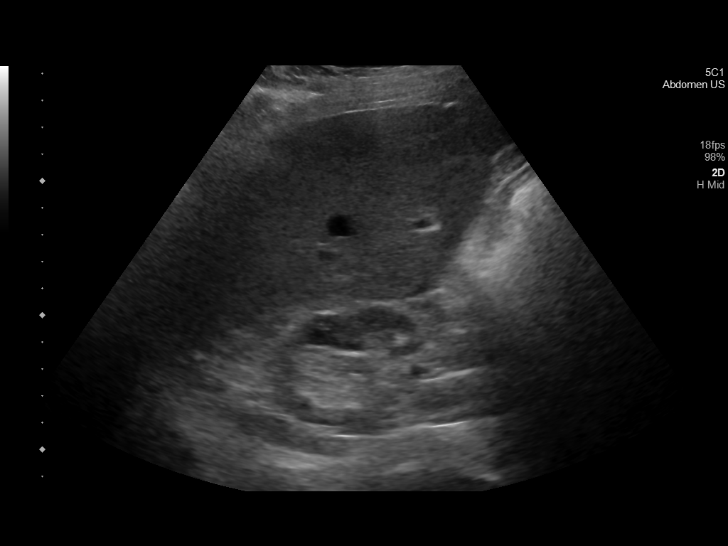
[im 19/30]
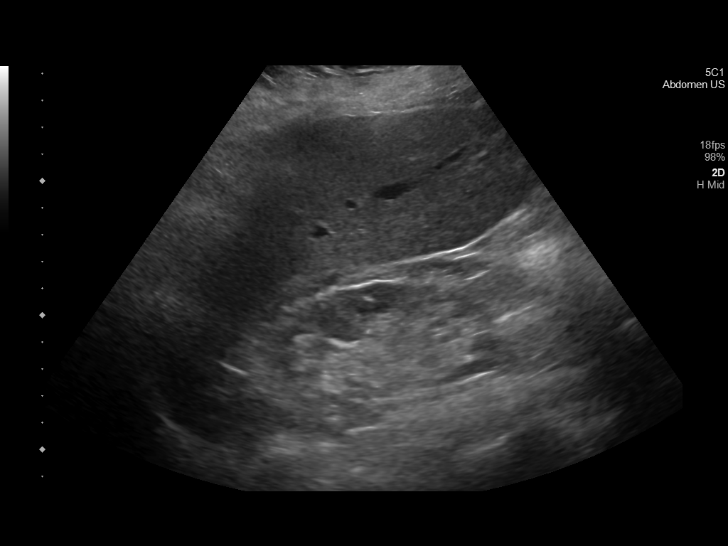
[im 21/30]
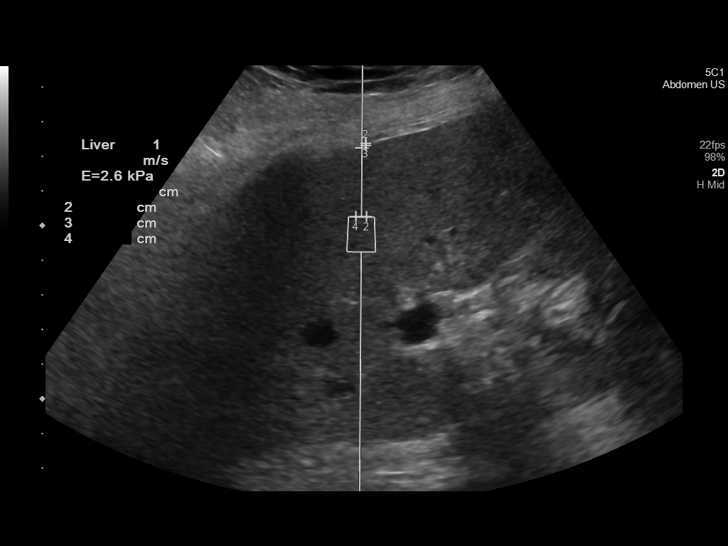
[im 23/30]
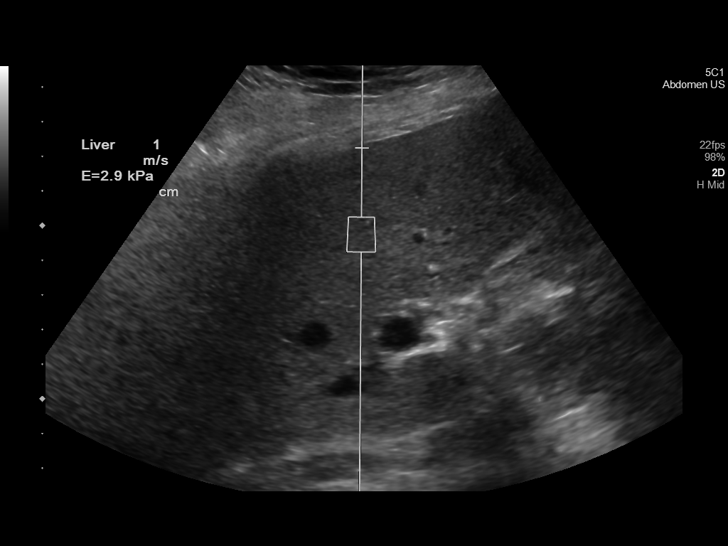
[im 26/30]
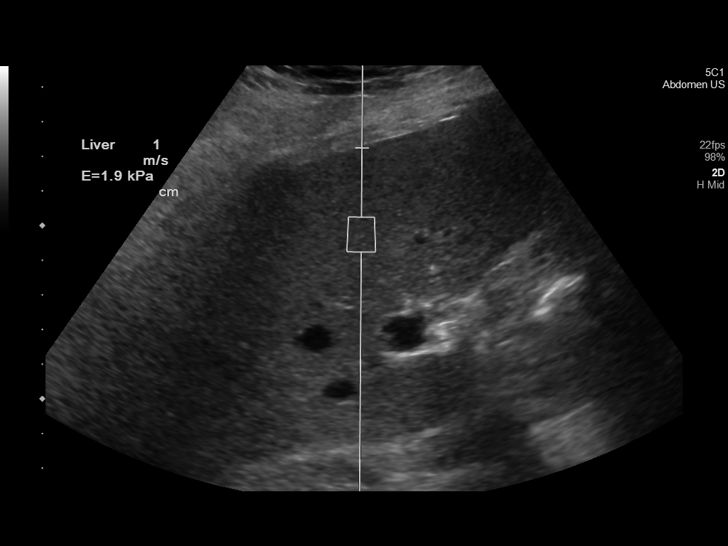
[im 28/30]
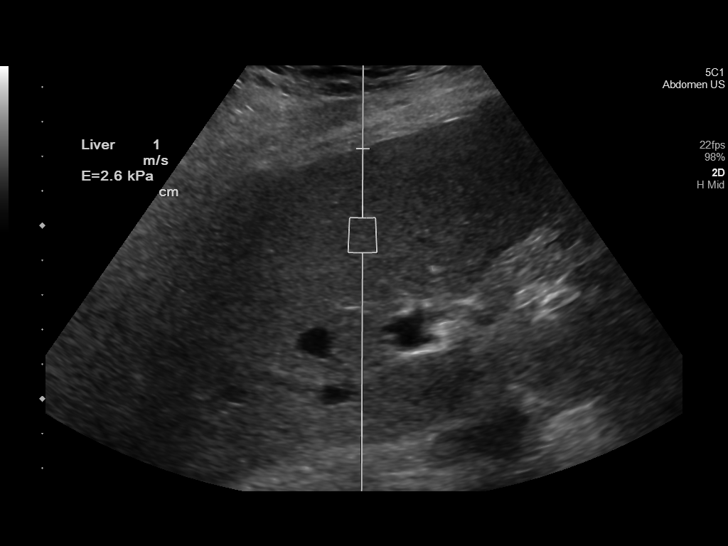

[12 of 25 positions shown; findings below may reference images not displayed]

FINDINGS: Liver: No focal lesion identified. Diffusely increased hepatic
parenchymal echogenicity. Portal vein is patent on color Doppler
imaging with normal direction of blood flow towards the liver.

ULTRASOUND HEPATIC ELASTOGRAPHY

Device: Siemens Helix VTQ

Patient position: Supine

Transducer: [id number]

Number of measurements: 10

Hepatic segment:  8

Median kPa:

IQR:

IQR/Median kPa ratio:

Data quality: IQR/Median kPa ratio of 0.3 or greater indicates
reduced accuracy

Diagnostic category:  < or = 5 kPa: high probability of being normal

The use of hepatic elastography is applicable to patients with viral
hepatitis and non-alcoholic fatty liver disease. At this time, there
is insufficient data for the referenced cut-off values and use in
other causes of liver disease, including alcoholic liver disease.
Patients, however, may be assessed by elastography and serve as
their own reference standard/baseline.

In patients with non-alcoholic liver disease, the values suggesting
compensated advanced chronic liver disease (cACLD) may be lower, and
patients may need additional testing with elasticity results of [DATE]
kPa.

Please note that abnormal hepatic elasticity and shear wave
velocities may also be identified in clinical settings other than
with hepatic fibrosis, such as: acute hepatitis, elevated right
heart and central venous pressures including use of beta blockers,
Wigardt disease ([place]), infiltrative processes such as
mastocytosis/amyloidosis/infiltrative tumor/lymphoma, extrahepatic
cholestasis, with hyperemia in the post-prandial state, and with
liver transplantation. Correlation with patient history, laboratory
data, and clinical condition recommended.

Diagnostic Categories:

< or =5 kPa: high probability of being normal

< or =9 kPa: in the absence of other known clinical signs, rules [DATE] kPa and ?13 kPa: suggestive of cACLD, but needs further testing

>13 kPa: highly suggestive of cACLD

> or =17 kPa: highly suggestive of cACLD with an increased
probability of clinically significant portal hypertension
IMPRESSION: ULTRASOUND LIVER:

Diffusely increased hepatic parenchymal echogenicity with decreased
acoustic through transmission most commonly reflects hepatic
steatosis. No overt hepatic lesion identified.

ULTRASOUND HEPATIC ELASTOGRAPHY:

Median kPa:

Diagnostic category:  < or = 5 kPa: high probability of being normal

## 2023-01-02 DIAGNOSIS — I4891 Unspecified atrial fibrillation: Secondary | ICD-10-CM | POA: Diagnosis not present

## 2023-01-02 DIAGNOSIS — I10 Essential (primary) hypertension: Secondary | ICD-10-CM | POA: Diagnosis not present

## 2023-01-02 DIAGNOSIS — I2510 Atherosclerotic heart disease of native coronary artery without angina pectoris: Secondary | ICD-10-CM | POA: Diagnosis not present

## 2023-01-09 ENCOUNTER — Emergency Department
Admission: EM | Admit: 2023-01-09 | Discharge: 2023-01-09 | Disposition: A | Discharge status: Home / Self Care | Payer: Medicare PPO | Attending: Emergency Medicine | Admitting: Emergency Medicine

## 2023-01-09 ENCOUNTER — Other Ambulatory Visit: Payer: Self-pay

## 2023-01-09 ENCOUNTER — Emergency Department: Payer: Medicare PPO

## 2023-01-09 DIAGNOSIS — Z1152 Encounter for screening for COVID-19: Secondary | ICD-10-CM | POA: Diagnosis not present

## 2023-01-09 DIAGNOSIS — R509 Fever, unspecified: Secondary | ICD-10-CM | POA: Diagnosis not present

## 2023-01-09 DIAGNOSIS — R079 Chest pain, unspecified: Secondary | ICD-10-CM | POA: Diagnosis not present

## 2023-01-09 DIAGNOSIS — J189 Pneumonia, unspecified organism: Secondary | ICD-10-CM | POA: Diagnosis not present

## 2023-01-09 DIAGNOSIS — I129 Hypertensive chronic kidney disease with stage 1 through stage 4 chronic kidney disease, or unspecified chronic kidney disease: Secondary | ICD-10-CM | POA: Diagnosis not present

## 2023-01-09 DIAGNOSIS — Z7901 Long term (current) use of anticoagulants: Secondary | ICD-10-CM | POA: Diagnosis not present

## 2023-01-09 DIAGNOSIS — Z79899 Other long term (current) drug therapy: Secondary | ICD-10-CM | POA: Diagnosis not present

## 2023-01-09 DIAGNOSIS — R059 Cough, unspecified: Secondary | ICD-10-CM | POA: Diagnosis not present

## 2023-01-09 DIAGNOSIS — N189 Chronic kidney disease, unspecified: Secondary | ICD-10-CM | POA: Insufficient documentation

## 2023-01-09 LAB — BASIC METABOLIC PANEL
Anion gap: 10 (ref 5–15)
BUN: 14 mg/dL (ref 8–23)
CO2: 27 mmol/L (ref 22–32)
Calcium: 9 mg/dL (ref 8.9–10.3)
Chloride: 101 mmol/L (ref 98–111)
Creatinine, Ser: 0.66 mg/dL (ref 0.44–1.00)
GFR, Estimated: >60 mL/min (ref >60)
Glucose, Bld: 113 mg/dL — ABNORMAL HIGH (ref 70–99)
Potassium: 3.5 mmol/L (ref 3.5–5.1)
Sodium: 138 mmol/L (ref 135–145)

## 2023-01-09 LAB — RESP PANEL BY RT-PCR (RSV, FLU A&B, COVID)  RVPGX2
Influenza A by PCR: NEGATIVE
Influenza B by PCR: NEGATIVE
Resp Syncytial Virus by PCR: NEGATIVE
SARS Coronavirus 2 by RT PCR: NEGATIVE

## 2023-01-09 LAB — CBC
HCT: 37.4 % (ref 36.0–46.0)
Hemoglobin: 11.9 g/dL — ABNORMAL LOW (ref 12.0–15.0)
MCH: 29.1 pg (ref 26.0–34.0)
MCHC: 31.8 g/dL (ref 30.0–36.0)
MCV: 91.4 fL (ref 80.0–100.0)
Platelets: 163 10*3/uL (ref 150–400)
RBC: 4.09 MIL/uL (ref 3.87–5.11)
RDW: 13 % (ref 11.5–15.5)
WBC: 4.5 10*3/uL (ref 4.0–10.5)
nRBC: 0 % (ref 0.0–0.2)

## 2023-01-09 MED ORDER — AMOXICILLIN-POT CLAVULANATE 875-125 MG PO TABS: 1.0000 | ORAL_TABLET | Freq: Two times a day (BID) | ORAL | 0 refills | Status: AC

## 2023-01-09 MED ORDER — AMOXICILLIN-POT CLAVULANATE 875-125 MG PO TABS
1.0000 | ORAL_TABLET | Freq: Once | ORAL | Status: AC
  Administered 2023-01-09: 1 via ORAL
  Filled 2023-01-09: qty 1

## 2023-01-09 MED ORDER — AZITHROMYCIN 250 MG PO TABS: ORAL_TABLET | ORAL | 0 refills | Status: DC

## 2023-01-09 MED ORDER — AZITHROMYCIN 500 MG PO TABS
500.0000 mg | ORAL_TABLET | Freq: Once | ORAL | Status: AC
  Administered 2023-01-09: 500 mg via ORAL
  Filled 2023-01-09: qty 1

## 2023-01-09 NOTE — ED Provider Notes (Signed)
[facility] Provider Note    Event Date/Time   First MD Initiated Contact with Patient 01/09/23 2019     (approximate)   History   Chills, Fever, and Cough   HPI  Sarah Riddle is a 82 y.o. female   Past medical history of note history of DVT, on Eliquis for atrial fibrillation, CKD, hypertension, hyperlipidemia, who presents to the emergency department with subjective fever, chills, nausea, and productive cough since last night green sputum.  No vomiting or diarrhea, denies abdominal pain, GU symptoms.  No known sick contacts.  She lives at home by herself and is able to perform all activities of daily living.  Despite her symptoms she is able to continue to do all activities of daily living.   She reports no chest pain.  She has no other acute medical complaints.  Independent Historian contributed to assessment above: Her family member who is at bedside corroborates past medical history as above.  External Medical Documents Reviewed: Discharge summary from January 2024 when she was admitted for hypertensive urgency      Physical Exam   Triage Vital Signs: ED Triage Vitals  Enc Vitals Group     BP 01/09/23 1754 (!) 181/77     Pulse Rate 01/09/23 1754 79     Resp 01/09/23 1754 20     Temp 01/09/23 1754 99.6 F (37.6 C)     Temp Source 01/09/23 1754 Oral     SpO2 01/09/23 1756 94 %     Weight 01/09/23 1755 180 lb (81.6 kg)     Height 01/09/23 1755 5\' 11"  (1.803 m)     Head Circumference --      Peak Flow --      Pain Score 01/09/23 1755 5     Pain Loc --      Pain Edu? --      Excl. in GC? --     Most recent vital signs: Vitals:   01/09/23 2045 01/09/23 2118  BP:  (!) 168/68  Pulse: 67 68  Resp: (!) 23 14  Temp:    SpO2: 92% 93%    General: Awake, no distress.  CV:  Good peripheral perfusion.  Resp:  Normal effort.  Abd:  No distention.  Other:  She is in no respiratory distress she is slightly hypertensive and afebrile  and has normal oxygen saturation on room air.  She is nontoxic-appearing.  She does have some focal coarse breath sounds on the left lower lung fields.   ED Results / Procedures / Treatments   Labs (all labs ordered are listed, but only abnormal results are displayed) Labs Reviewed  BASIC METABOLIC PANEL - Abnormal; Notable for the following components:      Result Value   Glucose, Bld 113 (*)    All other components within normal limits  CBC - Abnormal; Notable for the following components:   Hemoglobin 11.9 (*)    All other components within normal limits  RESP PANEL BY RT-PCR (RSV, FLU A&B, COVID)  [id number]     I ordered and reviewed the above labs they are notable for blood cell count is normal   RADIOLOGY I independently reviewed and interpreted chest x-ray see no obvious focality or pneumothorax   PROCEDURES:  Critical Care performed: No  Procedures   MEDICATIONS ORDERED IN ED: Medications  amoxicillin-clavulanate (AUGMENTIN) 875-125 MG per tablet 1 tablet (1 tablet Oral Given 01/09/23 2116)  azithromycin (ZITHROMAX) tablet 500 mg (500 mg  Oral Given 01/09/23 2116)     IMPRESSION / MDM / ASSESSMENT AND PLAN / ED COURSE  I reviewed the triage vital signs and the nursing notes.                                Patient's presentation is most consistent with acute presentation with potential threat to life or bodily function.  Differential diagnosis includes, but is not limited to, viral URI, bacterial pneumonia, bronchitis, sepsis   The patient is on the cardiac monitor to evaluate for evidence of arrhythmia and/or significant heart rate changes.  MDM: This is a patient with clinical symptoms and exam consistent for bacterial pneumonia with subjective fevers, cough, productive of green sputum, and focal findings of left lung field coarseness.  She is a temperature of 99.6, but does not have an increased white blood cell count nor focality on chest x-ray.  However given  her symptoms and exam findings I will treat her for community-acquired pneumonia with antibiotics.  First dose given in the emergency department.    I considered hospitalization for admission or observation however Given her overall well appearance and normal vital signs I think outpatient treatment and management most appropriate this time she understands to return to emergency department with any new or worsening symptoms.        FINAL CLINICAL IMPRESSION(S) / ED DIAGNOSES   Final diagnoses:  Community acquired pneumonia of left lung, unspecified part of lung     Rx / DC Orders   ED Discharge Orders          Ordered    amoxicillin-clavulanate (AUGMENTIN) 875-125 MG tablet  2 times daily        01/09/23 2107    azithromycin (ZITHROMAX) 250 MG tablet        01/09/23 2107             Note:  This document was prepared using Dragon voice recognition software and may include unintentional dictation errors.    Pilar Jarvis, MD 01/09/23 [phone]

## 2023-01-09 NOTE — ED Triage Notes (Signed)
Pt to ED with family member for sudden onset of chills, cough, fever since last night. States today began coughing up green phlegm. Has also been slightly dizzy for about 1 week, PCP adjusted her BP meds because BP was on low side.   Temp was 101, took tylenol about 1 hour ago, then 100.4 PTA.

## 2023-01-09 NOTE — Discharge Instructions (Signed)
Take antibiotics for the full course as prescribed.  Take Tylenol 650 mg every 6 hours with breakfast lunch and dinner for fever, aches, chills.  See your doctor this week for a follow-up visit.  If you have any new worsening or unexpected symptoms like severe pain, difficulty breathing, or any new or unexpected symptoms call your doctor right away or return to emergency department.

## 2023-01-10 ENCOUNTER — Ambulatory Visit: Payer: Medicare PPO | Admitting: Cardiovascular Disease

## 2023-01-11 ENCOUNTER — Telehealth: Payer: Self-pay

## 2023-01-11 NOTE — Telephone Encounter (Signed)
ED Discharge   ED Admit Date:: 01/09/2023 ED Discharge Date:: 01/09/2023 ICD Code/Description:: J18 : Pneumonia, unspecified organism Treatment Location:: [facility] . Start: 01/11/23, 11:20:06 AM. Received ER notification regarding pt. DOS: 01/09/23. Facility: [facility]. Diagnosis: Community-acquired pneumonia of left lung. Reviewed EPIC EMR for more information. Prescribed Augmentin 875-125mg  BID and Azithromycin 250mg  (unspecified duration). First med doses were given in the ER. PCP follow-up: Not specified in the discharge notes.  Called pt's home phone. The phone rang continuously; no VM available. Called the cell phone number on file. This call went to VM immediately, but pt's VM has not been set up. Call Outreach Attempt #1 Date:: 01/10/2023 Time: PM Outcome:: Unsuccessful - Other Outcome Details:: Unable to reach pt (short-staffed). Attempt #2 Date:: 01/11/2023 Time: AM Outcome:: Unsuccessful - Other Outcome Details:: Called house phone. Phone rang continuously; no VM available. Attempt #3 Date:: 01/11/2023 Time: AM Outcome:: Unsuccessful - Other Outcome Details:: Called cell phone. Call went to VM immediately, but pt has not set up her VM. Is there a longitudinal CRN on the team that has seen the patient since being enrolled?: No Clinical Lead Review CP/CRN Follow Up ED discharge protocol reviewed and: Clinical Lead Follow-up indicated. Clinical Lead Follow Up Plan: FPO scheduled next week. Unable to reach patient  She went to the ER Sunday afternoon. She was Dx with lung infection and tx like Pneumonia. She was given Augmentin and ZPak. She felt better this afternoon. She didn't sleep Friday night and Saturday night. She has chills and running a fever. When it got worse, she went to the ER. She is still coughing up green stuff.  She has been checking the BP at home also. At the hospital it was 188/77. This morning it was 132/66.  Saturday was 177/68. Yesterday it was 158/75 about an hour after waking up. She is on losartan 25mg , spironolactone 25mg , Metoprolol XL 25mg  daily, Isosorbide 60mg  daily. Patient is seeing Chelsa on 4/15 and Dr.S.Khan on 01/20/23. I informed her I will speak to her after the 4/15 and address any questions she has.  Lynann Bologna, PharmD Call and documentation 

## 2023-01-12 ENCOUNTER — Telehealth: Payer: Self-pay

## 2023-01-12 NOTE — Telephone Encounter (Signed)
     Patient  visit on 01/09/2023  at [facility] was for chills, fever, cough.  Have you been able to follow up with your primary care physician? Yes patient has appointment 01/17/23.  The patient was or was not able to obtain any needed medicine or equipment. Patient was able to obtain medication.  Are there diet recommendations that you are having difficulty following? No  Patient expresses understanding of discharge instructions and education provided has no other needs at this time. Yes   Shabree Tebbetts [facility]  [facility] Population Health Community Resource Care Guide   [email or web address]  ?? [phone]   Website: [email or web address]  [email or web address]

## 2023-01-17 ENCOUNTER — Ambulatory Visit (INDEPENDENT_AMBULATORY_CARE_PROVIDER_SITE_OTHER): Payer: Medicare PPO

## 2023-01-17 ENCOUNTER — Ambulatory Visit (INDEPENDENT_AMBULATORY_CARE_PROVIDER_SITE_OTHER): Payer: Medicare PPO | Admitting: Nurse Practitioner

## 2023-01-17 VITALS — BP 124/60 | HR 63 | Ht 70.0 in | Wt 183.0 lb

## 2023-01-17 DIAGNOSIS — J189 Pneumonia, unspecified organism: Secondary | ICD-10-CM

## 2023-01-17 DIAGNOSIS — J301 Allergic rhinitis due to pollen: Secondary | ICD-10-CM

## 2023-01-17 DIAGNOSIS — E785 Hyperlipidemia, unspecified: Secondary | ICD-10-CM | POA: Diagnosis not present

## 2023-01-17 DIAGNOSIS — G629 Polyneuropathy, unspecified: Secondary | ICD-10-CM | POA: Diagnosis not present

## 2023-01-17 NOTE — Patient Instructions (Signed)
1) CXR to follow up on CAP 2) Imodium as instructed OTC for diarrhea  3) BRAT x 2 days, push water 4) Delsym, cough drops to keep control 5) Followup appt 2 weeks

## 2023-01-17 NOTE — Progress Notes (Signed)
Established Patient Office Visit  Subjective:  Patient ID: Sarah Riddle, female    DOB: Sep 11, 1941  Age: 82 y.o. MRN: [id number]  Chief Complaint  Patient presents with   Follow-up    Hospital Follow Up    Hospital follow up.  Pneumonia.  Has been taking Augmentin, azithromycin.  She has diarrhea.  She has lingering cough.      No other concerns at this time.   Past Medical History:  Diagnosis Date   A-fib    Accelerated hypertension 10/31/2022   Age related osteoporosis    Cancer    Melanoma x 2 -- Lt Leg -1976 Rt Arm - 2014   Chronic bilateral low back pain    Chronic kidney disease    Complication of anesthesia    DVT (deep venous thrombosis) 1960   right leg  was on BC pills   Dysrhythmia    Family history of breast cancer    Family history of colon cancer    Family history of melanoma    Family history of prostate cancer    Fibrocystic breast disease    GERD (gastroesophageal reflux disease)    Headache    migraine   Heart murmur    not heard now.   History of kidney stones    Hyperlipidemia    Hypertension    IBS (irritable bowel syndrome)    Lumbago    Mitral valve prolapse    Neuropathy    Obstructive sleep apnea    Osteoarthritis of shoulder    PONV (postoperative nausea and vomiting)     Past Surgical History:  Procedure Laterality Date   ABDOMINAL HYSTERECTOMY     BACK SURGERY     BREAST CYST ASPIRATION Left 2001   Negative   CARDIAC ELECTROPHYSIOLOGY MAPPING AND ABLATION     EYE SURGERY     HUMERUS IM NAIL Right 05/05/2022   Procedure: INTRAMEDULLARY (IM) NAIL HUMERAL;  Surgeon: Roby Lofts, MD;  Location: [facility] OR;  Service: Orthopedics;  Laterality: Right;   HYSTEROTOMY     L4-L5  2020   titaum rebok cage   LEFT HEART CATH AND CORONARY ANGIOGRAPHY N/A 11/01/2022   Procedure: LEFT HEART CATH AND CORONARY ANGIOGRAPHY and possible PCI and stent;  Surgeon: Laurier Nancy, MD;  Location: [facility] INVASIVE CV LAB;  Service: Cardiovascular;   Laterality: N/A;   TOTAL HIP ARTHROPLASTY Left     Social History   Socioeconomic History   Marital status: Married    Spouse name: Not on file   Number of children: Not on file   Years of education: Not on file   Highest education level: Not on file  Occupational History   Not on file  Tobacco Use   Smoking status: Never   Smokeless tobacco: Never  Vaping Use   Vaping Use: Never used  Substance and Sexual Activity   Alcohol use: No   Drug use: No   Sexual activity: Not on file  Other Topics Concern   Not on file  Social History Narrative   Not on file   Social Determinants of Health   Financial Resource Strain: Not on file  Food Insecurity: No Food Insecurity (10/31/2022)   Hunger Vital Sign    Worried About Running Out of Food in the Last Year: Never true    Ran Out of Food in the Last Year: Never true  Transportation Needs: No Transportation Needs (10/31/2022)   PRAPARE - Transportation    Lack  of Transportation (Medical): No    Lack of Transportation (Non-Medical): No  Physical Activity: Not on file  Stress: Not on file  Social Connections: Not on file  Intimate Partner Violence: Not At Risk (10/31/2022)   Humiliation, Afraid, Rape, and Kick questionnaire    Fear of Current or Ex-Partner: No    Emotionally Abused: No    Physically Abused: No    Sexually Abused: No    Family History  Problem Relation Age of Onset   Colon cancer Mother 49   Prostate cancer Father        dx 34s   Cancer Sister    Acute myelogenous leukemia Sister        dx 62s   Prostate cancer Brother    Melanoma Brother    Prostate cancer Brother    Melanoma Brother    HIV/AIDS Brother 75   Tuberculosis Paternal Aunt    Melanoma Other    Breast cancer Other    Melanoma Niece     Allergies  Allergen Reactions   Iodinated Contrast Media Shortness Of Breath    Can be premedicated   Ibuprofen Other (See Comments)    "stomach problems"    Lubiprostone Nausea And Vomiting         Sulfa Antibiotics Other (See Comments)    "Stomach problems"    Zolpidem Other (See Comments)    "hallucinations"  Other reaction(s): Confusion  Other Reaction(s): confusion   Boniva [Ibandronic Acid] Diarrhea and Nausea And Vomiting   Celecoxib Other (See Comments)    GI upset    Doxycycline Diarrhea and Nausea And Vomiting   Duloxetine Other (See Comments)    Over sedation with Zelnorm (IBS med)   Tetracyclines & Related Nausea Only and Nausea And Vomiting    Review of Systems  Constitutional:  Positive for malaise/fatigue.  HENT: Negative.    Eyes: Negative.   Respiratory:  Positive for cough and sputum production.   Cardiovascular: Negative.   Gastrointestinal: Negative.   Genitourinary: Negative.   Musculoskeletal:  Positive for joint pain and myalgias.  Skin: Negative.   Neurological: Negative.   Endo/Heme/Allergies: Negative.   Psychiatric/Behavioral:  Positive for depression.        Objective:   BP 124/60   Pulse 63   Ht 5\' 10"  (1.778 m)   Wt 183 lb (83 kg)   SpO2 95%   BMI 26.26 kg/m   Vitals:   01/17/23 1001  BP: 124/60  Pulse: 63  Height: 5\' 10"  (1.778 m)  Weight: 183 lb (83 kg)  SpO2: 95%  BMI (Calculated): 26.26    Physical Exam Vitals reviewed.  Constitutional:      Appearance: Normal appearance.  HENT:     Head: Normocephalic.     Nose: Nose normal.     Mouth/Throat:     Mouth: Mucous membranes are moist.  Eyes:     Pupils: Pupils are equal, round, and reactive to light.  Cardiovascular:     Rate and Rhythm: Normal rate and regular rhythm.  Pulmonary:     Breath sounds: Wheezing present.  Abdominal:     General: Bowel sounds are normal.     Palpations: Abdomen is soft.  Musculoskeletal:        General: Tenderness present.     Cervical back: Normal range of motion and neck supple.  Skin:    General: Skin is warm and dry.  Neurological:     Mental Status: She is alert and oriented to person,  place, and time.   Psychiatric:        Mood and Affect: Mood normal.        Behavior: Behavior normal.      No results found for any visits on 01/17/23.      Assessment & Plan:   Problem List Items Addressed This Visit       Respiratory   Allergic rhinitis - Primary   Community acquired bilateral lower lobe pneumonia   Relevant Orders   DG Chest 2 View     Nervous and Auditory   Neuropathy (Chronic)     Other   HLD (hyperlipidemia)    Return in about 2 weeks (around 01/31/2023).   Total time spent: 40 minutes  [name], NP  01/17/2023

## 2023-01-18 ENCOUNTER — Telehealth: Payer: Self-pay

## 2023-01-18 NOTE — Telephone Encounter (Signed)
Focused Pharmacist Outreach  . Details of the Visit: She is feeling a lot better today. Her Diarrhea is better now that antibiotics are winding down. She was given Delsym cough syrup and that helped tremendously. Her chest x-ray was clear.  She takes Levocetirizine for allergies every night. She is still having Dizziness, which could be dehydration. She is working on drinking water. Patient took her daughter to Endoscopy/Colonoscopy. (everything looked good, but since there were symptoms, they did a biopsy)  Patient has been logging BP, today 122/62. Highest its was on 01/09/23 - 158.78. Date of next Pharmacist Follow-up: 06/14/2023 General Review Call by [facility] in June, 2024 . Lynann Bologna, PharmD  Call and documentation 

## 2023-01-20 ENCOUNTER — Encounter: Payer: Self-pay | Admitting: Cardiovascular Disease

## 2023-01-20 ENCOUNTER — Ambulatory Visit (INDEPENDENT_AMBULATORY_CARE_PROVIDER_SITE_OTHER): Payer: Medicare PPO | Admitting: Cardiovascular Disease

## 2023-01-20 VITALS — BP 118/76 | HR 56 | Ht 71.0 in | Wt 188.4 lb

## 2023-01-20 DIAGNOSIS — I480 Paroxysmal atrial fibrillation: Secondary | ICD-10-CM

## 2023-01-20 DIAGNOSIS — G4733 Obstructive sleep apnea (adult) (pediatric): Secondary | ICD-10-CM | POA: Diagnosis not present

## 2023-01-20 DIAGNOSIS — I10 Essential (primary) hypertension: Secondary | ICD-10-CM | POA: Diagnosis not present

## 2023-01-20 DIAGNOSIS — J189 Pneumonia, unspecified organism: Secondary | ICD-10-CM | POA: Diagnosis not present

## 2023-01-20 NOTE — Progress Notes (Signed)
Cardiology Office Note   Date:  01/20/2023   ID:  Mabrey, Howland Jan 25, 1941, MRN [id number]  PCP:  Margaretann Loveless, MD  Cardiologist:  Adrian Blackwater, MD      History of Present Illness: Sarah Riddle is a 82 y.o. female who presents for  Chief Complaint  Patient presents with   Follow-up    4 week follow up    SOB on exertion, and had fever and chills and went to ER, as had pnemonia and off antibiotics.      Past Medical History:  Diagnosis Date   A-fib    Accelerated hypertension 10/31/2022   Age related osteoporosis    Cancer    Melanoma x 2 -- Lt Leg -1976 Rt Arm - 2014   Chronic bilateral low back pain    Chronic kidney disease    Complication of anesthesia    DVT (deep venous thrombosis) 1960   right leg  was on BC pills   Dysrhythmia    Family history of breast cancer    Family history of colon cancer    Family history of melanoma    Family history of prostate cancer    Fibrocystic breast disease    GERD (gastroesophageal reflux disease)    Headache    migraine   Heart murmur    not heard now.   History of kidney stones    Hyperlipidemia    Hypertension    IBS (irritable bowel syndrome)    Lumbago    Mitral valve prolapse    Neuropathy    Obstructive sleep apnea    Osteoarthritis of shoulder    PONV (postoperative nausea and vomiting)      Past Surgical History:  Procedure Laterality Date   ABDOMINAL HYSTERECTOMY     BACK SURGERY     BREAST CYST ASPIRATION Left 2001   Negative   CARDIAC ELECTROPHYSIOLOGY MAPPING AND ABLATION     EYE SURGERY     HUMERUS IM NAIL Right 05/05/2022   Procedure: INTRAMEDULLARY (IM) NAIL HUMERAL;  Surgeon: Roby Lofts, MD;  Location: [facility] OR;  Service: Orthopedics;  Laterality: Right;   HYSTEROTOMY     L4-L5  2020   titaum rebok cage   LEFT HEART CATH AND CORONARY ANGIOGRAPHY N/A 11/01/2022   Procedure: LEFT HEART CATH AND CORONARY ANGIOGRAPHY and possible PCI and stent;  Surgeon: Laurier Nancy,  MD;  Location: [facility] INVASIVE CV LAB;  Service: Cardiovascular;  Laterality: N/A;   TOTAL HIP ARTHROPLASTY Left      Current Outpatient Medications  Medication Sig Dispense Refill   acetaminophen (TYLENOL) 500 MG tablet Take 500 mg by mouth every 6 (six) hours as needed. Once a day     alendronate (FOSAMAX) 70 MG tablet Take 70 mg by mouth every Thursday. Take with a full glass of water on an empty stomach.     allopurinol (ZYLOPRIM) 100 MG tablet Take 100 mg by mouth daily.     Cranberry 125 MG TABS Take 125 mg by mouth daily.     cyanocobalamin (VITAMIN B12) 1000 MCG tablet Take 1,000 mcg by mouth daily. Monday to Friday     ELIQUIS 5 MG TABS tablet TAKE 1 TABLET TWICE DAILY 60 tablet 3   ezetimibe (ZETIA) 10 MG tablet Take 10 mg by mouth daily.     fluticasone (FLONASE) 50 MCG/ACT nasal spray Place 2 sprays into both nostrils daily.     gabapentin (NEURONTIN) 800 MG tablet Take  800 mg by mouth in the morning, at noon, in the evening, and at bedtime.     hydrALAZINE (APRESOLINE) 25 MG tablet Take 25 mg by mouth in the morning and at bedtime.     isosorbide mononitrate (IMDUR) 60 MG 24 hr tablet Take 60 mg by mouth daily.     levocetirizine (XYZAL) 5 MG tablet Take 5 mg by mouth every evening.     losartan (COZAAR) 25 MG tablet Take 25 mg by mouth daily.     metoprolol succinate (TOPROL-XL) 25 MG 24 hr tablet Take 25 mg by mouth daily.     nitroGLYCERIN (NITROSTAT) 0.4 MG SL tablet Place 0.4 mg under the tongue every 5 (five) minutes as needed for chest pain.     pantoprazole (PROTONIX) 40 MG tablet Take 40 mg by mouth daily.     rosuvastatin (CRESTOR) 40 MG tablet Take 40 mg by mouth at bedtime.     sertraline (ZOLOFT) 25 MG tablet Take 25 mg by mouth 3 (three) times daily. 2 tabs in AM 1 HS     spironolactone (ALDACTONE) 25 MG tablet Take 1 tablet (25 mg total) by mouth daily. 30 tablet 11   tetrahydrozoline-zinc (VISINE-AC) 0.05-0.25 % ophthalmic solution Place 2 drops into both eyes 3  (three) times daily as needed (dry eyes).     Vitamin D, Ergocalciferol, 50000 units CAPS TAKE 1 CAPSULE BY MOUTH ONCE WEEKLY. 12 capsule 3   azithromycin (ZITHROMAX) 250 MG tablet Take 1 tablet (250 mg) daily for the next 4 days starting on Monday, 01/10/2023 (Patient not taking: Reported on 01/20/2023) 4 each 0   EPINEPHrine 0.3 mg/0.3 mL IJ SOAJ injection Inject 0.3 mg into the muscle as needed. (Patient not taking: Reported on 01/20/2023)     No current facility-administered medications for this visit.    Allergies:   Iodinated contrast media, Ibuprofen, Lubiprostone, Sulfa antibiotics, Zolpidem, Boniva [ibandronic acid], Celecoxib, Doxycycline, Duloxetine, and Tetracyclines & related    Social History:   reports that she has never smoked. She has never used smokeless tobacco. She reports that she does not drink alcohol and does not use drugs.   Family History:  family history includes Acute myelogenous leukemia in her sister; Breast cancer in an other family member; Cancer in her sister; Colon cancer (age of onset: 56) in her mother; HIV/AIDS (age of onset: 23) in her brother; Melanoma in her brother, brother, niece, and another family member; Prostate cancer in her brother, brother, and father; Tuberculosis in her paternal aunt.    ROS:     Review of Systems  Constitutional: Negative.   HENT: Negative.    Eyes: Negative.   Respiratory: Negative.    Gastrointestinal: Negative.   Genitourinary: Negative.   Musculoskeletal: Negative.   Skin: Negative.   Neurological: Negative.   Endo/Heme/Allergies: Negative.   Psychiatric/Behavioral: Negative.    All other systems reviewed and are negative.     All other systems are reviewed and negative.    PHYSICAL EXAM: VS:  BP 118/76   Pulse (!) 56   Ht  (1.803 m)   Wt 188 lb 6.4 oz (85.5 kg)   SpO2 96%   BMI 26.28 kg/m  , BMI Body mass index is 26.28 kg/m. Last weight:  Wt Readings from Last 3 Encounters:  01/20/23 188 lb 6.4  oz (85.5 kg)  01/17/23 183 lb (83 kg)  01/09/23 180 lb (81.6 kg)     Physical Exam Constitutional:      Appearance:  Normal appearance.  Cardiovascular:     Rate and Rhythm: Normal rate and regular rhythm.     Heart sounds: Normal heart sounds.  Pulmonary:     Effort: Pulmonary effort is normal.     Breath sounds: Normal breath sounds.  Musculoskeletal:     Right lower leg: No edema.     Left lower leg: No edema.  Neurological:     Mental Status: She is alert.       EKG:   Recent Labs: 10/31/2022: ALT 10; B Natriuretic Peptide 1,191.7 11/01/2022: Magnesium 1.9 01/09/2023: BUN 14; Creatinine, Ser 0.66; Hemoglobin 11.9; Platelets 163; Potassium 3.5; Sodium 138    Lipid Panel No results found for: "CHOL", "TRIG", "HDL", "CHOLHDL", "VLDL", "LDLCALC", "LDLDIRECT"    Other studies Reviewed: Additional studies/ records that were reviewed today include:  Review of the above records demonstrates:       No data to display            ASSESSMENT AND PLAN:    ICD-10-CM   1. Primary hypertension  I10 PCV ECHOCARDIOGRAM COMPLETE    2. PAF (paroxysmal atrial fibrillation)  I48.0 PCV ECHOCARDIOGRAM COMPLETE    3. Community acquired bilateral lower lobe pneumonia  J18.9 PCV ECHOCARDIOGRAM COMPLETE    4. Obstructive sleep apnea (adult) (pediatric)  G47.33 PCV ECHOCARDIOGRAM COMPLETE       Problem List Items Addressed This Visit       Cardiovascular and Mediastinum   PAF (paroxysmal atrial fibrillation)    HR 58, stable      Relevant Medications   ezetimibe (ZETIA) 10 MG tablet   EPINEPHrine 0.3 mg/0.3 mL IJ SOAJ injection   Other Relevant Orders   PCV ECHOCARDIOGRAM COMPLETE   HTN (hypertension) - Primary    BO stable now.      Relevant Medications   ezetimibe (ZETIA) 10 MG tablet   EPINEPHrine 0.3 mg/0.3 mL IJ SOAJ injection   Other Relevant Orders   PCV ECHOCARDIOGRAM COMPLETE     Respiratory   Obstructive sleep apnea (adult) (pediatric)   Relevant  Orders   PCV ECHOCARDIOGRAM COMPLETE   Community acquired bilateral lower lobe pneumonia    Resolved      Relevant Orders   PCV ECHOCARDIOGRAM COMPLETE       Disposition:   Return in about 4 weeks (around 02/17/2023) for echo and f/u.    Total time spent: 30 minutes  Signed,  Adrian Blackwater, MD  01/20/2023 10:09 AM    [facility]

## 2023-01-20 NOTE — Assessment & Plan Note (Signed)
BO stable now.

## 2023-01-20 NOTE — Assessment & Plan Note (Signed)
Resolved

## 2023-01-20 NOTE — Assessment & Plan Note (Signed)
HR 58, stable

## 2023-01-24 ENCOUNTER — Other Ambulatory Visit: Payer: Self-pay | Admitting: Cardiovascular Disease

## 2023-01-25 DIAGNOSIS — D0439 Carcinoma in situ of skin of other parts of face: Secondary | ICD-10-CM | POA: Diagnosis not present

## 2023-01-31 ENCOUNTER — Other Ambulatory Visit: Payer: Self-pay | Admitting: Internal Medicine

## 2023-01-31 ENCOUNTER — Ambulatory Visit (INDEPENDENT_AMBULATORY_CARE_PROVIDER_SITE_OTHER): Payer: Medicare PPO | Admitting: Nurse Practitioner

## 2023-01-31 VITALS — BP 128/60 | HR 68 | Ht 70.5 in | Wt 186.0 lb

## 2023-01-31 DIAGNOSIS — M79605 Pain in left leg: Secondary | ICD-10-CM | POA: Diagnosis not present

## 2023-01-31 DIAGNOSIS — N189 Chronic kidney disease, unspecified: Secondary | ICD-10-CM

## 2023-01-31 DIAGNOSIS — G8929 Other chronic pain: Secondary | ICD-10-CM

## 2023-01-31 DIAGNOSIS — I10 Essential (primary) hypertension: Secondary | ICD-10-CM

## 2023-01-31 DIAGNOSIS — R42 Dizziness and giddiness: Secondary | ICD-10-CM | POA: Diagnosis not present

## 2023-01-31 DIAGNOSIS — M79604 Pain in right leg: Secondary | ICD-10-CM | POA: Diagnosis not present

## 2023-01-31 MED ORDER — MECLIZINE HCL 12.5 MG PO TABS
12.5000 mg | ORAL_TABLET | Freq: Three times a day (TID) | ORAL | 0 refills | Status: AC | PRN
Start: 2023-01-31 — End: ?

## 2023-01-31 NOTE — Progress Notes (Signed)
Established Patient Office Visit  Subjective:  Patient ID: Sarah Riddle, female    DOB: 11-17-1940  Age: 82 y.o. MRN: [id number]  Chief Complaint  Patient presents with   Follow-up    2 weeks follow up    2 week follow up, CXR is negative s/p PNA.  Patient having episodes dizziness.  Will trial meclizine.  Pt already taking antihistamine and NS steroid.  Encourage 4 bottles of water daily.      No other concerns at this time.   Past Medical History:  Diagnosis Date   A-fib [facility])    Accelerated hypertension 10/31/2022   Age related osteoporosis    Cancer (HCC)    Melanoma x 2 -- Lt Leg -1976 Rt Arm - 2014   Chronic bilateral low back pain    Chronic kidney disease    Complication of anesthesia    DVT (deep venous thrombosis) (HCC) 1960   right leg  was on BC pills   Dysrhythmia    Family history of breast cancer    Family history of colon cancer    Family history of melanoma    Family history of prostate cancer    Fibrocystic breast disease    GERD (gastroesophageal reflux disease)    Headache    migraine   Heart murmur    not heard now.   History of kidney stones    Hyperlipidemia    Hypertension    IBS (irritable bowel syndrome)    Lumbago    Mitral valve prolapse    Neuropathy    Obstructive sleep apnea    Osteoarthritis of shoulder    PONV (postoperative nausea and vomiting)     Past Surgical History:  Procedure Laterality Date   ABDOMINAL HYSTERECTOMY     BACK SURGERY     BREAST CYST ASPIRATION Left 2001   Negative   CARDIAC ELECTROPHYSIOLOGY MAPPING AND ABLATION     EYE SURGERY     HUMERUS IM NAIL Right 05/05/2022   Procedure: INTRAMEDULLARY (IM) NAIL HUMERAL;  Surgeon: Roby Lofts, MD;  Location: [facility] OR;  Service: Orthopedics;  Laterality: Right;   HYSTEROTOMY     L4-L5  2020   titaum rebok cage   LEFT HEART CATH AND CORONARY ANGIOGRAPHY N/A 11/01/2022   Procedure: LEFT HEART CATH AND CORONARY ANGIOGRAPHY and possible PCI and stent;   Surgeon: Laurier Nancy, MD;  Location: [facility] INVASIVE CV LAB;  Service: Cardiovascular;  Laterality: N/A;   TOTAL HIP ARTHROPLASTY Left     Social History   Socioeconomic History   Marital status: Married    Spouse name: Not on file   Number of children: Not on file   Years of education: Not on file   Highest education level: Not on file  Occupational History   Not on file  Tobacco Use   Smoking status: Never   Smokeless tobacco: Never  Vaping Use   Vaping Use: Never used  Substance and Sexual Activity   Alcohol use: No   Drug use: No   Sexual activity: Not on file  Other Topics Concern   Not on file  Social History Narrative   Not on file   Social Determinants of Health   Financial Resource Strain: Not on file  Food Insecurity: No Food Insecurity (10/31/2022)   Hunger Vital Sign    Worried About Running Out of Food in the Last Year: Never true    Ran Out of Food in the Last Year: Never  true  Transportation Needs: No Transportation Needs (10/31/2022)   PRAPARE - Administrator, Civil Service (Medical): No    Lack of Transportation (Non-Medical): No  Physical Activity: Not on file  Stress: Not on file  Social Connections: Not on file  Intimate Partner Violence: Not At Risk (10/31/2022)   Humiliation, Afraid, Rape, and Kick questionnaire    Fear of Current or Ex-Partner: No    Emotionally Abused: No    Physically Abused: No    Sexually Abused: No    Family History  Problem Relation Age of Onset   Colon cancer Mother 29   Prostate cancer Father        dx 65s   Cancer Sister    Acute myelogenous leukemia Sister        dx 79s   Prostate cancer Brother    Melanoma Brother    Prostate cancer Brother    Melanoma Brother    HIV/AIDS Brother 80   Tuberculosis Paternal Aunt    Melanoma Other    Breast cancer Other    Melanoma Niece     Allergies  Allergen Reactions   Iodinated Contrast Media Shortness Of Breath    Can be premedicated   Ibuprofen  Other (See Comments)    "stomach problems"    Lubiprostone Nausea And Vomiting        Sulfa Antibiotics Other (See Comments)    "Stomach problems"    Zolpidem Other (See Comments)    "hallucinations"  Other reaction(s): Confusion  Other Reaction(s): confusion   Boniva [Ibandronic Acid] Diarrhea and Nausea And Vomiting   Celecoxib Other (See Comments)    GI upset    Doxycycline Diarrhea and Nausea And Vomiting   Duloxetine Other (See Comments)    Over sedation with Zelnorm (IBS med)   Tetracyclines & Related Nausea Only and Nausea And Vomiting    Review of Systems  Constitutional: Negative.   HENT: Negative.    Eyes: Negative.   Respiratory: Negative.    Cardiovascular: Negative.   Gastrointestinal: Negative.   Genitourinary: Negative.   Musculoskeletal:  Positive for joint pain and myalgias.  Skin: Negative.   Neurological: Negative.   Endo/Heme/Allergies: Negative.   Psychiatric/Behavioral:  Positive for depression. The patient is nervous/anxious.        Objective:   BP 128/60   Pulse 68   Ht 5' 10.5" (1.791 m)   Wt 186 lb (84.4 kg)   SpO2 94%   BMI 26.31 kg/m   Vitals:   01/31/23 0932  BP: 128/60  Pulse: 68  Height: 5' 10.5" (1.791 m)  Weight: 186 lb (84.4 kg)  SpO2: 94%  BMI (Calculated): 26.3    Physical Exam Vitals reviewed.  Constitutional:      Appearance: Normal appearance.  HENT:     Head: Normocephalic.     Nose: Nose normal.     Mouth/Throat:     Mouth: Mucous membranes are moist.  Eyes:     Pupils: Pupils are equal, round, and reactive to light.  Cardiovascular:     Rate and Rhythm: Normal rate and regular rhythm.  Pulmonary:     Effort: Pulmonary effort is normal.     Breath sounds: Normal breath sounds.  Abdominal:     General: Bowel sounds are normal.     Palpations: Abdomen is soft.  Musculoskeletal:        General: Normal range of motion.     Cervical back: Normal range of motion and neck supple.  Skin:    General:  Skin is warm and dry.     Findings: Lesion present.     Comments: Pt had SCC on left side of forehead, pressure bandage intact and sutures will be removed by Derm tomorrow  Neurological:     Mental Status: She is alert and oriented to person, place, and time.  Psychiatric:        Mood and Affect: Mood normal.        Behavior: Behavior normal.      No results found for any visits on 01/31/23.  Recent Results (from the past 2160 hour(s))  Resp panel by RT-PCR (RSV, Flu A&B, Covid) Anterior Nasal Swab     Status: None   Collection Time: 01/09/23  6:00 PM   Specimen: Anterior Nasal Swab  Result Value Ref Range   SARS Coronavirus 2 by RT PCR NEGATIVE NEGATIVE    Comment: (NOTE) SARS-CoV-2 target nucleic acids are NOT DETECTED.  The SARS-CoV-2 RNA is generally detectable in upper respiratory specimens during the acute phase of infection. The lowest concentration of SARS-CoV-2 viral copies this assay can detect is 138 copies/mL. A negative result does not preclude SARS-Cov-2 infection and should not be used as the sole basis for treatment or other patient management decisions. A negative result may occur with  improper specimen collection/handling, submission of specimen other than nasopharyngeal swab, presence of viral mutation(s) within the areas targeted by this assay, and inadequate number of viral copies(<138 copies/mL). A negative result must be combined with clinical observations, patient history, and epidemiological information. The expected result is Negative.  Fact Sheet for Patients:  BloggerCourse.com  Fact Sheet for Healthcare Providers:  SeriousBroker.it  This test is no t yet approved or cleared by the [place] FDA and  has been authorized for detection and/or diagnosis of SARS-CoV-2 by FDA under an Emergency Use Authorization (EUA). This EUA will remain  in effect (meaning this test can be used) for the  duration of the COVID-19 declaration under Section 564(b)(1) of the Act, 21 U.S.C.section 360bbb-3(b)(1), unless the authorization is terminated  or revoked sooner.       Influenza A by PCR NEGATIVE NEGATIVE   Influenza B by PCR NEGATIVE NEGATIVE    Comment: (NOTE) The Xpert Xpress SARS-CoV-2/FLU/RSV plus assay is intended as an aid in the diagnosis of influenza from Nasopharyngeal swab specimens and should not be used as a sole basis for treatment. Nasal washings and aspirates are unacceptable for Xpert Xpress SARS-CoV-2/FLU/RSV testing.  Fact Sheet for Patients: BloggerCourse.com  Fact Sheet for Healthcare Providers: SeriousBroker.it  This test is not yet approved or cleared by the [place] FDA and has been authorized for detection and/or diagnosis of SARS-CoV-2 by FDA under an Emergency Use Authorization (EUA). This EUA will remain in effect (meaning this test can be used) for the duration of the COVID-19 declaration under Section 564(b)(1) of the Act, 21 U.S.C. section 360bbb-3(b)(1), unless the authorization is terminated or revoked.     Resp Syncytial Virus by PCR NEGATIVE NEGATIVE    Comment: (NOTE) Fact Sheet for Patients: BloggerCourse.com  Fact Sheet for Healthcare Providers: SeriousBroker.it  This test is not yet approved or cleared by the [place] FDA and has been authorized for detection and/or diagnosis of SARS-CoV-2 by FDA under an Emergency Use Authorization (EUA). This EUA will remain in effect (meaning this test can be used) for the duration of the COVID-19 declaration under Section 564(b)(1) of the Act, 21 U.S.C. section 360bbb-3(b)(1), unless the  authorization is terminated or revoked.  Performed at [facility], [address] [place], [place] [address]   Basic metabolic panel     Status: Abnormal   Collection Time: 01/09/23   6:00 PM  Result Value Ref Range   Sodium 138 135 - 145 mmol/L   Potassium 3.5 3.5 - 5.1 mmol/L   Chloride 101 98 - 111 mmol/L   CO2 27 22 - 32 mmol/L   Glucose, Bld 113 (H) 70 - 99 mg/dL    Comment: Glucose reference range applies only to samples taken after fasting for at least 8 hours.   BUN 14 8 - 23 mg/dL   Creatinine, Ser 6.04 0.44 - 1.00 mg/dL   Calcium 9.0 8.9 - 54.0 mg/dL   GFR, Estimated >98 >11 mL/min    Comment: (NOTE) Calculated using the CKD-EPI Creatinine Equation (2021)    Anion gap 10 5 - 15    Comment: Performed at [facility], [address] [place], [place] [address]  CBC     Status: Abnormal   Collection Time: 01/09/23  6:00 PM  Result Value Ref Range   WBC 4.5 4.0 - 10.5 K/uL   RBC 4.09 3.87 - 5.11 MIL/uL   Hemoglobin 11.9 (L) 12.0 - 15.0 g/dL   HCT 29.5 62.1 - 30.8 %   MCV 91.4 80.0 - 100.0 fL   MCH 29.1 26.0 - 34.0 pg   MCHC 31.8 30.0 - 36.0 g/dL   RDW 65.7 84.6 - 96.2 %   Platelets 163 150 - 400 K/uL   nRBC 0.0 0.0 - 0.2 %    Comment: Performed at [facility], [address] [place], [place] [address]      Assessment & Plan:   Problem List Items Addressed This Visit   None   No follow-ups on file.   Total time spent: 35 minutes  [name], NP  01/31/2023

## 2023-01-31 NOTE — Patient Instructions (Signed)
1) CXR was normal 2) Pt will stop ferrous sulfate until next visit 3) Meclizine prn for dizziness 4) Follow up appt in 3 weeks and check CBC for anemia

## 2023-02-04 ENCOUNTER — Ambulatory Visit (INDEPENDENT_AMBULATORY_CARE_PROVIDER_SITE_OTHER): Payer: Medicare PPO

## 2023-02-04 DIAGNOSIS — I371 Nonrheumatic pulmonary valve insufficiency: Secondary | ICD-10-CM | POA: Diagnosis not present

## 2023-02-04 DIAGNOSIS — G4733 Obstructive sleep apnea (adult) (pediatric): Secondary | ICD-10-CM

## 2023-02-04 DIAGNOSIS — I340 Nonrheumatic mitral (valve) insufficiency: Secondary | ICD-10-CM

## 2023-02-04 DIAGNOSIS — I361 Nonrheumatic tricuspid (valve) insufficiency: Secondary | ICD-10-CM

## 2023-02-04 DIAGNOSIS — I10 Essential (primary) hypertension: Secondary | ICD-10-CM

## 2023-02-04 DIAGNOSIS — J189 Pneumonia, unspecified organism: Secondary | ICD-10-CM

## 2023-02-04 DIAGNOSIS — I480 Paroxysmal atrial fibrillation: Secondary | ICD-10-CM

## 2023-02-16 ENCOUNTER — Telehealth: Payer: Self-pay

## 2023-02-16 NOTE — Telephone Encounter (Signed)
Patient called stating that she has cancelled her appt for ECHO Results due to her daughter being in the hospital she is requesting that we call her with the ECHO results

## 2023-02-17 ENCOUNTER — Ambulatory Visit: Payer: Medicare PPO | Admitting: Cardiovascular Disease

## 2023-02-21 ENCOUNTER — Ambulatory Visit: Payer: Medicare PPO | Admitting: Nurse Practitioner

## 2023-02-22 DIAGNOSIS — G4733 Obstructive sleep apnea (adult) (pediatric): Secondary | ICD-10-CM | POA: Diagnosis not present

## 2023-02-27 DIAGNOSIS — S300XXA Contusion of lower back and pelvis, initial encounter: Secondary | ICD-10-CM | POA: Diagnosis not present

## 2023-03-07 ENCOUNTER — Other Ambulatory Visit: Payer: Self-pay | Admitting: Cardiovascular Disease

## 2023-03-11 ENCOUNTER — Ambulatory Visit (INDEPENDENT_AMBULATORY_CARE_PROVIDER_SITE_OTHER): Payer: Medicare PPO | Admitting: Family

## 2023-03-11 DIAGNOSIS — U071 COVID-19: Secondary | ICD-10-CM | POA: Diagnosis not present

## 2023-03-11 DIAGNOSIS — R6889 Other general symptoms and signs: Secondary | ICD-10-CM | POA: Diagnosis not present

## 2023-03-11 DIAGNOSIS — Z20822 Contact with and (suspected) exposure to covid-19: Secondary | ICD-10-CM

## 2023-03-11 LAB — POCT XPERT XPRESS SARS COVID-2/FLU/RSV
FLU A: NEGATIVE
FLU B: NEGATIVE
RSV RNA, PCR: NEGATIVE
SARS Coronavirus 2: POSITIVE

## 2023-03-11 MED ORDER — PAXLOVID (300/100) 20 X 150 MG & 10 X 100MG PO TBPK
3.0000 | ORAL_TABLET | Freq: Two times a day (BID) | ORAL | 0 refills | Status: DC
Start: 2023-03-11 — End: 2023-03-25

## 2023-03-11 NOTE — Progress Notes (Signed)
   Subjective   CHIEF COMPLAINT  COVID Testing      REASON FOR VISIT  COVID Test Only       Objective   Results for orders placed or performed in visit on 03/11/23  POCT XPERT XPRESS SARS COVID-2/FLU/RSV [[id number]]  Result Value Ref Range   SARS Coronavirus 2 Positive    FLU A Negative    FLU B Negative    RSV RNA, PCR Negative     Assessment & Plan  1. Flu-like symptoms Paxlovid sent.  - POCT XPERT XPRESS SARS COVID-2/FLU/RSV [[id number]]   Total time spent: 10 minutes  Miki Kins, FNP 03/11/2023

## 2023-03-15 ENCOUNTER — Telehealth: Payer: Self-pay

## 2023-03-15 NOTE — Telephone Encounter (Signed)
General Review Call  Sarah Riddle  81 years, Female  DOB: Feb 06, 1941  M: [phone]  __________________________________________________ General Review [facility]) Chart Review Have there been any documented new, changed, or discontinued medications since last visit?  (Include name, dose, frequency, date): 01/20/23 [facility] (Cardiology)- Follow up. No medication changes noted. 01/31/23 [facility] Sarah Riddle (PCP)- Vertigo. Stopped Ferrous Sulfate. Started Meclizine PRN for dizziness. 03/11/23 [facility] (PCP)- + COVID. Paxlovid started. Has there been any documented recent hospitalizations or ED visits since last visit with Clinical Lead?: No Adherence Review Does the [facility] have access to medication refill data?: No Disease State Questions Able to connect with the Patient?: Yes Did patient have any problems with their health recently?: Yes Note Problems and Concerns:: + COVID- feeling a little better  Daughter just released from hospital after developing blood clot in her arm after knee surgery. Due to COVID, CNA not able to come to help.  Husband currently in ICU with double pneumonia.  Everyone in the family + for COVID and being treated. Patient states that she is trying to do everything for everyone. I encouraged her to be sure to drink plenty of water and try to rest when she has a chance. Her next appointment is Friday, 03/25/23 with Orson Sarah Riddle. Have you had any admissions or emergency room visits or worsening of your condition(s) since last visit?: No Have you had any visits with new specialists or providers since your last visit?: Yes Explain:: *See chart review 02/26/23- [facility] due to back pain from fall. She states they put her on Prednisone, but it did not help with the pain. At this time there is nothing else that she can do about this. She is using a walker and being careful. Have you had any new health care problem(s) since your last visit?: Yes New problem(s) reported:: COVID  + Fall Have you run out of any of your medications since you last spoke with your clinical lead?: No Are there any medications you are not taking as prescribed?: No Are you having any issues or side effects with your medications?: No Do you have any other health concerns or questions you want to discuss with your Clinical lead  before your next visit?: No Are there any health concerns that you feel we can do a better job addressing?: No Are you having any problems with any of the following since the last visit: (select all that apply): Ambulating/walking Details: Back pain from fall. Any falls since last visit?: Yes Details: Fell on 02/26/23, and went to [facility] where they did X-rays and said she has no break. They treated her with Prednisone, with little improvement of pain. Any increased or uncontrolled pain since last visit?: Yes Details: Back pain due to fall. Next visit Type:: Clinic Visit with:: [facility] Date:: 03/25/2023 Time:: 0915 Additional Details: Appointment is with PCP. Engagement Notes dimock, Sarah Riddle on 03/15/2023 11:06 AM   [facility] Patient Time: HC General Review Call: 15 minutes (03/15/23)   Clinical Lead Review Review Adherence gaps identified?: No Drug Therapy Problems identified?: No Assessment: No new needs identified. Follow-up as scheduled Reviewed 

## 2023-03-17 ENCOUNTER — Encounter: Admission: RE | Payer: Self-pay | Source: Home / Self Care

## 2023-03-17 ENCOUNTER — Other Ambulatory Visit: Payer: Self-pay | Admitting: Cardiovascular Disease

## 2023-03-17 ENCOUNTER — Ambulatory Visit: Admission: RE | Admit: 2023-03-17 | Payer: Medicare PPO | Source: Home / Self Care | Admitting: Gastroenterology

## 2023-03-17 ENCOUNTER — Other Ambulatory Visit: Payer: Self-pay | Admitting: Nurse Practitioner

## 2023-03-17 SURGERY — COLONOSCOPY
Anesthesia: General

## 2023-03-23 ENCOUNTER — Other Ambulatory Visit: Payer: Medicare PPO

## 2023-03-23 DIAGNOSIS — M545 Low back pain, unspecified: Secondary | ICD-10-CM | POA: Diagnosis not present

## 2023-03-23 DIAGNOSIS — M5416 Radiculopathy, lumbar region: Secondary | ICD-10-CM | POA: Diagnosis not present

## 2023-03-23 DIAGNOSIS — R7301 Impaired fasting glucose: Secondary | ICD-10-CM | POA: Diagnosis not present

## 2023-03-23 DIAGNOSIS — E785 Hyperlipidemia, unspecified: Secondary | ICD-10-CM | POA: Diagnosis not present

## 2023-03-23 DIAGNOSIS — E669 Obesity, unspecified: Secondary | ICD-10-CM | POA: Diagnosis not present

## 2023-03-23 DIAGNOSIS — I10 Essential (primary) hypertension: Secondary | ICD-10-CM

## 2023-03-24 LAB — CBC WITH DIFFERENTIAL
Basophils Absolute: 0 10*3/uL (ref 0.0–0.2)
Basos: 1 %
EOS (ABSOLUTE): 0.1 10*3/uL (ref 0.0–0.4)
Eos: 2 %
Hematocrit: 32.6 % — ABNORMAL LOW (ref 34.0–46.6)
Hemoglobin: 10.4 g/dL — ABNORMAL LOW (ref 11.1–15.9)
Immature Grans (Abs): 0 10*3/uL (ref 0.0–0.1)
Immature Granulocytes: 0 %
Lymphocytes Absolute: 1.4 10*3/uL (ref 0.7–3.1)
Lymphs: 36 %
MCH: 29.3 pg (ref 26.6–33.0)
MCHC: 31.9 g/dL (ref 31.5–35.7)
MCV: 92 fL (ref 79–97)
Monocytes Absolute: 0.2 10*3/uL (ref 0.1–0.9)
Monocytes: 6 %
Neutrophils Absolute: 2.2 10*3/uL (ref 1.4–7.0)
Neutrophils: 55 %
RBC: 3.55 x10E6/uL — ABNORMAL LOW (ref 3.77–5.28)
RDW: 14 % (ref 11.7–15.4)
WBC: 3.9 10*3/uL (ref 3.4–10.8)

## 2023-03-24 LAB — CMP14+EGFR
ALT: 14 IU/L (ref 0–32)
AST: 21 IU/L (ref 0–40)
Albumin: 3.9 g/dL (ref 3.7–4.7)
Alkaline Phosphatase: 104 IU/L (ref 44–121)
BUN/Creatinine Ratio: 23 (ref 12–28)
BUN: 15 mg/dL (ref 8–27)
Bilirubin Total: 0.5 mg/dL (ref 0.0–1.2)
CO2: 26 mmol/L (ref 20–29)
Calcium: 9.5 mg/dL (ref 8.7–10.3)
Chloride: 107 mmol/L — ABNORMAL HIGH (ref 96–106)
Creatinine, Ser: 0.65 mg/dL (ref 0.57–1.00)
Globulin, Total: 1.8 g/dL (ref 1.5–4.5)
Glucose: 91 mg/dL (ref 70–99)
Potassium: 4.4 mmol/L (ref 3.5–5.2)
Sodium: 144 mmol/L (ref 134–144)
Total Protein: 5.7 g/dL — ABNORMAL LOW (ref 6.0–8.5)
eGFR: 88 mL/min/{1.73_m2} (ref >59)

## 2023-03-24 LAB — HEMOGLOBIN A1C
Est. average glucose Bld gHb Est-mCnc: 114 mg/dL
Hgb A1c MFr Bld: 5.6 % (ref 4.8–5.6)

## 2023-03-24 LAB — LIPID PANEL
Chol/HDL Ratio: 2.3 ratio (ref 0.0–4.4)
Cholesterol, Total: 135 mg/dL (ref 100–199)
HDL: 59 mg/dL (ref >39)
LDL Chol Calc (NIH): 57 mg/dL (ref 0–99)
Triglycerides: 106 mg/dL (ref 0–149)
VLDL Cholesterol Cal: 19 mg/dL (ref 5–40)

## 2023-03-24 LAB — TSH: TSH: 0.909 u[IU]/mL (ref 0.450–4.500)

## 2023-03-25 ENCOUNTER — Ambulatory Visit (INDEPENDENT_AMBULATORY_CARE_PROVIDER_SITE_OTHER): Payer: Medicare PPO | Admitting: Internal Medicine

## 2023-03-25 ENCOUNTER — Encounter: Payer: Self-pay | Admitting: Internal Medicine

## 2023-03-25 VITALS — BP 160/80 | HR 64 | Ht 71.0 in | Wt 174.8 lb

## 2023-03-25 DIAGNOSIS — E785 Hyperlipidemia, unspecified: Secondary | ICD-10-CM

## 2023-03-25 DIAGNOSIS — I10 Essential (primary) hypertension: Secondary | ICD-10-CM

## 2023-03-25 DIAGNOSIS — Z13820 Encounter for screening for osteoporosis: Secondary | ICD-10-CM | POA: Diagnosis not present

## 2023-03-25 DIAGNOSIS — N189 Chronic kidney disease, unspecified: Secondary | ICD-10-CM | POA: Diagnosis not present

## 2023-03-25 DIAGNOSIS — Z1211 Encounter for screening for malignant neoplasm of colon: Secondary | ICD-10-CM

## 2023-03-25 DIAGNOSIS — Z1231 Encounter for screening mammogram for malignant neoplasm of breast: Secondary | ICD-10-CM | POA: Diagnosis not present

## 2023-03-25 NOTE — Progress Notes (Signed)
Established Patient Office Visit  Subjective:  Patient ID: Sarah Riddle, female    DOB: 1941-06-01  Age: 82 y.o. MRN: [id number]  Chief Complaint  Patient presents with   Follow-up    4 month follow up    Patient in office for 4 month follow up, discuss labs. Patient under a lot of stress. On prednisone and robaxin for back pain from [facility]. Needs to reschedule colonoscopy. Due for a mammogram. Blood pressure elevated today, reports being compliant with her medications. However can take another of Losartan 25 mg =50 mg daily since her BP is staying up. Continue to monitor at home.    No other concerns at this time.   Past Medical History:  Diagnosis Date   A-fib [facility])    Accelerated hypertension 10/31/2022   Age related osteoporosis    Cancer (HCC)    Melanoma x 2 -- Lt Leg -1976 Rt Arm - 2014   Chronic bilateral low back pain    Chronic kidney disease    Complication of anesthesia    DVT (deep venous thrombosis) (HCC) 1960   right leg  was on BC pills   Dysrhythmia    Family history of breast cancer    Family history of colon cancer    Family history of melanoma    Family history of prostate cancer    Fibrocystic breast disease    GERD (gastroesophageal reflux disease)    Headache    migraine   Heart murmur    not heard now.   History of kidney stones    Hyperlipidemia    Hypertension    IBS (irritable bowel syndrome)    Lumbago    Mitral valve prolapse    Neuropathy    Obstructive sleep apnea    Osteoarthritis of shoulder    PONV (postoperative nausea and vomiting)     Past Surgical History:  Procedure Laterality Date   ABDOMINAL HYSTERECTOMY     BACK SURGERY     BREAST CYST ASPIRATION Left 2001   Negative   CARDIAC ELECTROPHYSIOLOGY MAPPING AND ABLATION     EYE SURGERY     HUMERUS IM NAIL Right 05/05/2022   Procedure: INTRAMEDULLARY (IM) NAIL HUMERAL;  Surgeon: Roby Lofts, MD;  Location: [facility] OR;  Service: Orthopedics;  Laterality: Right;    HYSTEROTOMY     L4-L5  2020   titaum rebok cage   LEFT HEART CATH AND CORONARY ANGIOGRAPHY N/A 11/01/2022   Procedure: LEFT HEART CATH AND CORONARY ANGIOGRAPHY and possible PCI and stent;  Surgeon: Laurier Nancy, MD;  Location: [facility] INVASIVE CV LAB;  Service: Cardiovascular;  Laterality: N/A;   TOTAL HIP ARTHROPLASTY Left     Social History   Socioeconomic History   Marital status: Married    Spouse name: Not on file   Number of children: Not on file   Years of education: Not on file   Highest education level: Not on file  Occupational History   Not on file  Tobacco Use   Smoking status: Never   Smokeless tobacco: Never  Vaping Use   Vaping Use: Never used  Substance and Sexual Activity   Alcohol use: No   Drug use: No   Sexual activity: Not on file  Other Topics Concern   Not on file  Social History Narrative   Not on file   Social Determinants of Health   Financial Resource Strain: Not on file  Food Insecurity: No Food Insecurity (10/31/2022)  Hunger Vital Sign    Worried About Running Out of Food in the Last Year: Never true    Ran Out of Food in the Last Year: Never true  Transportation Needs: No Transportation Needs (10/31/2022)   PRAPARE - Administrator, Civil Service (Medical): No    Lack of Transportation (Non-Medical): No  Physical Activity: Not on file  Stress: Not on file  Social Connections: Not on file  Intimate Partner Violence: Not At Risk (10/31/2022)   Humiliation, Afraid, Rape, and Kick questionnaire    Fear of Current or Ex-Partner: No    Emotionally Abused: No    Physically Abused: No    Sexually Abused: No    Family History  Problem Relation Age of Onset   Colon cancer Mother 107   Prostate cancer Father        dx 30s   Cancer Sister    Acute myelogenous leukemia Sister        dx 39s   Prostate cancer Brother    Melanoma Brother    Prostate cancer Brother    Melanoma Brother    HIV/AIDS Brother 23   Tuberculosis  Paternal Aunt    Melanoma Other    Breast cancer Other    Melanoma Niece     Allergies  Allergen Reactions   Iodinated Contrast Media Shortness Of Breath and Other (See Comments)    Can be premedicated  Have been able to tolerate if given premeds (benadryl, prednisone)   Ibuprofen Other (See Comments)    "stomach problems"    Lubiprostone Nausea And Vomiting        Sulfa Antibiotics Other (See Comments)    "Stomach problems"  "Stomach problems"  "Stomach problems"  "Stomach problems"   Zolpidem Other (See Comments)    "hallucinations"  Other reaction(s): Confusion  Other Reaction(s): confusion   Boniva [Ibandronic Acid] Diarrhea and Nausea And Vomiting   Celecoxib Other (See Comments)    GI upset    Doxycycline Diarrhea and Nausea And Vomiting   Duloxetine Other (See Comments)    Over sedation with Zelnorm (IBS med)   Tetracyclines & Related Nausea Only and Nausea And Vomiting    Review of Systems  Constitutional:  Positive for malaise/fatigue and weight loss.  HENT:  Negative for congestion, ear discharge and hearing loss.   Eyes: Negative.   Respiratory:  Positive for stridor. Negative for cough, shortness of breath and wheezing.   Cardiovascular: Negative.  Negative for chest pain, palpitations, leg swelling and PND.  Gastrointestinal: Negative.  Negative for abdominal pain, constipation, diarrhea, heartburn, nausea and vomiting.  Genitourinary: Negative.  Negative for dysuria.  Musculoskeletal:  Positive for back pain.  Skin: Negative.   Neurological:  Negative for dizziness, speech change, focal weakness and headaches.  Endo/Heme/Allergies: Negative.   Psychiatric/Behavioral: Negative.         Objective:   BP (!) 160/80   Pulse 64   Ht 5\' 11"  (1.803 m)   Wt 174 lb 12.8 oz (79.3 kg)   SpO2 98%   BMI 24.38 kg/m   Vitals:   03/25/23 0915  BP: (!) 160/80  Pulse: 64  Height: 5\' 11"  (1.803 m)  Weight: 174 lb 12.8 oz (79.3 kg)  SpO2: 98%  BMI  (Calculated): 24.39    Physical Exam Vitals and nursing note reviewed.  Constitutional:      Appearance: Normal appearance.  HENT:     Head: Normocephalic and atraumatic.     Nose: Nose normal.  Cardiovascular:     Rate and Rhythm: Normal rate and regular rhythm.     Pulses: Normal pulses.     Heart sounds: Normal heart sounds. No murmur heard. Pulmonary:     Effort: Pulmonary effort is normal.     Breath sounds: Normal breath sounds. No wheezing, rhonchi or rales.  Abdominal:     General: Bowel sounds are normal.     Palpations: Abdomen is soft. There is no mass.     Tenderness: There is no abdominal tenderness. There is no right CVA tenderness, left CVA tenderness or guarding.     Hernia: No hernia is present.  Musculoskeletal:        General: Normal range of motion.     Cervical back: Normal range of motion.     Right lower leg: No edema.     Left lower leg: No edema.  Skin:    General: Skin is warm and dry.  Neurological:     General: No focal deficit present.     Mental Status: She is alert and oriented to person, place, and time.  Psychiatric:        Mood and Affect: Mood normal.        Behavior: Behavior normal.      No results found for any visits on 03/25/23.  Recent Results (from the past 2160 hour(s))  Resp panel by RT-PCR (RSV, Flu A&B, Covid) Anterior Nasal Swab     Status: None   Collection Time: 01/09/23  6:00 PM   Specimen: Anterior Nasal Swab  Result Value Ref Range   SARS Coronavirus 2 by RT PCR NEGATIVE NEGATIVE    Comment: (NOTE) SARS-CoV-2 target nucleic acids are NOT DETECTED.  The SARS-CoV-2 RNA is generally detectable in upper respiratory specimens during the acute phase of infection. The lowest concentration of SARS-CoV-2 viral copies this assay can detect is 138 copies/mL. A negative result does not preclude SARS-Cov-2 infection and should not be used as the sole basis for treatment or other patient management decisions. A negative  result may occur with  improper specimen collection/handling, submission of specimen other than nasopharyngeal swab, presence of viral mutation(s) within the areas targeted by this assay, and inadequate number of viral copies(<138 copies/mL). A negative result must be combined with clinical observations, patient history, and epidemiological information. The expected result is Negative.  Fact Sheet for Patients:  BloggerCourse.com  Fact Sheet for Healthcare Providers:  SeriousBroker.it  This test is no t yet approved or cleared by the [place] FDA and  has been authorized for detection and/or diagnosis of SARS-CoV-2 by FDA under an Emergency Use Authorization (EUA). This EUA will remain  in effect (meaning this test can be used) for the duration of the COVID-19 declaration under Section 564(b)(1) of the Act, 21 U.S.C.section 360bbb-3(b)(1), unless the authorization is terminated  or revoked sooner.       Influenza A by PCR NEGATIVE NEGATIVE   Influenza B by PCR NEGATIVE NEGATIVE    Comment: (NOTE) The Xpert Xpress SARS-CoV-2/FLU/RSV plus assay is intended as an aid in the diagnosis of influenza from Nasopharyngeal swab specimens and should not be used as a sole basis for treatment. Nasal washings and aspirates are unacceptable for Xpert Xpress SARS-CoV-2/FLU/RSV testing.  Fact Sheet for Patients: BloggerCourse.com  Fact Sheet for Healthcare Providers: SeriousBroker.it  This test is not yet approved or cleared by the [place] FDA and has been authorized for detection and/or diagnosis of SARS-CoV-2 by FDA under an Emergency Use  Authorization (EUA). This EUA will remain in effect (meaning this test can be used) for the duration of the COVID-19 declaration under Section 564(b)(1) of the Act, 21 U.S.C. section 360bbb-3(b)(1), unless the authorization is terminated  or revoked.     Resp Syncytial Virus by PCR NEGATIVE NEGATIVE    Comment: (NOTE) Fact Sheet for Patients: BloggerCourse.com  Fact Sheet for Healthcare Providers: SeriousBroker.it  This test is not yet approved or cleared by the [place] FDA and has been authorized for detection and/or diagnosis of SARS-CoV-2 by FDA under an Emergency Use Authorization (EUA). This EUA will remain in effect (meaning this test can be used) for the duration of the COVID-19 declaration under Section 564(b)(1) of the Act, 21 U.S.C. section 360bbb-3(b)(1), unless the authorization is terminated or revoked.  Performed at [facility], [address] [place], [place] [address]   Basic metabolic panel     Status: Abnormal   Collection Time: 01/09/23  6:00 PM  Result Value Ref Range   Sodium 138 135 - 145 mmol/L   Potassium 3.5 3.5 - 5.1 mmol/L   Chloride 101 98 - 111 mmol/L   CO2 27 22 - 32 mmol/L   Glucose, Bld 113 (H) 70 - 99 mg/dL    Comment: Glucose reference range applies only to samples taken after fasting for at least 8 hours.   BUN 14 8 - 23 mg/dL   Creatinine, Ser 4.33 0.44 - 1.00 mg/dL   Calcium 9.0 8.9 - 29.5 mg/dL   GFR, Estimated >18 >84 mL/min    Comment: (NOTE) Calculated using the CKD-EPI Creatinine Equation (2021)    Anion gap 10 5 - 15    Comment: Performed at [facility], [address] [place], [place] [address]  CBC     Status: Abnormal   Collection Time: 01/09/23  6:00 PM  Result Value Ref Range   WBC 4.5 4.0 - 10.5 K/uL   RBC 4.09 3.87 - 5.11 MIL/uL   Hemoglobin 11.9 (L) 12.0 - 15.0 g/dL   HCT 30.1 60.1 - 09.3 %   MCV 91.4 80.0 - 100.0 fL   MCH 29.1 26.0 - 34.0 pg   MCHC 31.8 30.0 - 36.0 g/dL   RDW 23.5 57.3 - 22.0 %   Platelets 163 150 - 400 K/uL   nRBC 0.0 0.0 - 0.2 %    Comment: Performed at [facility], [address] [place], [place] [address]  POCT XPERT XPRESS SARS  COVID-2/FLU/RSV [[id number]]     Status: Abnormal   Collection Time: 03/11/23 10:11 AM  Result Value Ref Range   SARS Coronavirus 2 Positive    FLU A Negative    FLU B Negative    RSV RNA, PCR Negative   Hemoglobin A1c     Status: None   Collection Time: 03/23/23 10:18 AM  Result Value Ref Range   Hgb A1c MFr Bld 5.6 4.8 - 5.6 %    Comment:          Prediabetes: 5.7 - 6.4          Diabetes: >6.4          Glycemic control for adults with diabetes: <7.0    Est. average glucose Bld gHb Est-mCnc 114 mg/dL  TSH     Status: None   Collection Time: 03/23/23 10:18 AM  Result Value Ref Range   TSH 0.909 0.450 - 4.500 uIU/mL  CMP14+EGFR     Status: Abnormal   Collection Time: 03/23/23  10:18 AM  Result Value Ref Range   Glucose 91 70 - 99 mg/dL   BUN 15 8 - 27 mg/dL   Creatinine, Ser 4.09 0.57 - 1.00 mg/dL   eGFR 88 >81 XB/JYN/8.29   BUN/Creatinine Ratio 23 12 - 28   Sodium 144 134 - 144 mmol/L   Potassium 4.4 3.5 - 5.2 mmol/L   Chloride 107 (H) 96 - 106 mmol/L   CO2 26 20 - 29 mmol/L   Calcium 9.5 8.7 - 10.3 mg/dL   Total Protein 5.7 (L) 6.0 - 8.5 g/dL   Albumin 3.9 3.7 - 4.7 g/dL   Globulin, Total 1.8 1.5 - 4.5 g/dL   Bilirubin Total 0.5 0.0 - 1.2 mg/dL   Alkaline Phosphatase 104 44 - 121 IU/L   AST 21 0 - 40 IU/L   ALT 14 0 - 32 IU/L  Lipid panel     Status: None   Collection Time: 03/23/23 10:18 AM  Result Value Ref Range   Cholesterol, Total 135 100 - 199 mg/dL   Triglycerides 562 0 - 149 mg/dL   HDL 59 >13 mg/dL   VLDL Cholesterol Cal 19 5 - 40 mg/dL   LDL Chol Calc (NIH) 57 0 - 99 mg/dL   Chol/HDL Ratio 2.3 0.0 - 4.4 ratio    Comment:                                   T. Chol/HDL Ratio                                             Men  Women                               1/2 Avg.Risk  3.4    3.3                                   Avg.Risk  5.0    4.4                                2X Avg.Risk  9.6    7.1                                3X Avg.Risk 23.4   11.0   CBC With  Differential     Status: Abnormal   Collection Time: 03/23/23 10:18 AM  Result Value Ref Range   WBC 3.9 3.4 - 10.8 x10E3/uL   RBC 3.55 (L) 3.77 - 5.28 x10E6/uL   Hemoglobin 10.4 (L) 11.1 - 15.9 g/dL   Hematocrit 08.6 (L) 57.8 - 46.6 %   MCV 92 79 - 97 fL   MCH 29.3 26.6 - 33.0 pg   MCHC 31.9 31.5 - 35.7 g/dL   RDW 46.9 62.9 - 52.8 %   Neutrophils 55 Not Estab. %   Lymphs 36 Not Estab. %   Monocytes 6 Not Estab. %   Eos 2 Not Estab. %   Basos 1 Not Estab. %   Neutrophils Absolute 2.2 1.4 - 7.0 x10E3/uL  Lymphocytes Absolute 1.4 0.7 - 3.1 x10E3/uL   Monocytes Absolute 0.2 0.1 - 0.9 x10E3/uL   EOS (ABSOLUTE) 0.1 0.0 - 0.4 x10E3/uL   Basophils Absolute 0.0 0.0 - 0.2 x10E3/uL   Immature Granulocytes 0 Not Estab. %   Immature Grans (Abs) 0.0 0.0 - 0.1 x10E3/uL    Comment: **Effective May 02, 2023, profile [id number] CBC/Differential**   (No Platelet) will be made non-orderable. [facility] Offers:   [id number] CBC With Differential/Platelet       Assessment & Plan:  Patient overall feeling well. Continues to have back pain from a fall in May this year. Seen by  [facility], prescribed robaxin and prednisone. Blood pressure elevated today, could be from prednisone or the stress of her spouse and daughter both currently being in the hospital. Recommend taking 2 losartan today. Continue monitoring blood pressure at home. Order for mammogram placed. Patient will call to reschedule colonoscopy. Reviewed fasting labs, stable.  Problem List Items Addressed This Visit     Hypertension - Primary   Chronic kidney disease   Hyperlipidemia   Colon cancer screening   Other Visit Diagnoses     Screening for osteoporosis           Return in about 2 weeks (around 04/08/2023).   Total time spent: 25 minutes  Margaretann Loveless, MD  03/25/2023   This document may have been prepared by [facility] Voice Recognition software and as such may include unintentional dictation errors.

## 2023-04-05 ENCOUNTER — Ambulatory Visit
Admission: RE | Admit: 2023-04-05 | Discharge: 2023-04-05 | Disposition: A | Discharge status: Home / Self Care | Payer: Medicare PPO | Source: Ambulatory Visit | Attending: Internal Medicine | Admitting: Internal Medicine

## 2023-04-05 DIAGNOSIS — Z1231 Encounter for screening mammogram for malignant neoplasm of breast: Secondary | ICD-10-CM | POA: Diagnosis not present

## 2023-04-06 ENCOUNTER — Other Ambulatory Visit: Payer: Self-pay | Admitting: Internal Medicine

## 2023-04-06 DIAGNOSIS — N6489 Other specified disorders of breast: Secondary | ICD-10-CM

## 2023-04-06 DIAGNOSIS — R928 Other abnormal and inconclusive findings on diagnostic imaging of breast: Secondary | ICD-10-CM

## 2023-04-11 ENCOUNTER — Other Ambulatory Visit: Payer: Self-pay | Admitting: Cardiovascular Disease

## 2023-04-12 ENCOUNTER — Ambulatory Visit (INDEPENDENT_AMBULATORY_CARE_PROVIDER_SITE_OTHER): Payer: Medicare PPO | Admitting: Internal Medicine

## 2023-04-12 ENCOUNTER — Encounter: Payer: Self-pay | Admitting: Internal Medicine

## 2023-04-12 VITALS — BP 142/64 | HR 57 | Ht 71.0 in | Wt 180.8 lb

## 2023-04-12 DIAGNOSIS — M79604 Pain in right leg: Secondary | ICD-10-CM | POA: Diagnosis not present

## 2023-04-12 DIAGNOSIS — K219 Gastro-esophageal reflux disease without esophagitis: Secondary | ICD-10-CM | POA: Diagnosis not present

## 2023-04-12 DIAGNOSIS — M79605 Pain in left leg: Secondary | ICD-10-CM

## 2023-04-12 DIAGNOSIS — G8929 Other chronic pain: Secondary | ICD-10-CM

## 2023-04-12 DIAGNOSIS — G894 Chronic pain syndrome: Secondary | ICD-10-CM | POA: Diagnosis not present

## 2023-04-12 DIAGNOSIS — I10 Essential (primary) hypertension: Secondary | ICD-10-CM

## 2023-04-12 DIAGNOSIS — E782 Mixed hyperlipidemia: Secondary | ICD-10-CM

## 2023-04-12 NOTE — Progress Notes (Signed)
Established Patient Office Visit  Subjective:  Patient ID: Sarah Riddle, female    DOB: 07-23-1941  Age: 82 y.o. MRN: [id number]  Chief Complaint  Patient presents with   Follow-up    2 week follow up    Patient comes in for 2 week follow up. Her last BP ws up , and Losartan was increased. But patient says at home her BP looks good, and she is only taking 25 mg of Losartan ,instead of 50 mg. Otherwise feels well, no new complaints.    No other concerns at this time.   Past Medical History:  Diagnosis Date   A-fib [facility])    Accelerated hypertension 10/31/2022   Age related osteoporosis    Cancer (HCC)    Melanoma x 2 -- Lt Leg -1976 Rt Arm - 2014   Chronic bilateral low back pain    Chronic kidney disease    Complication of anesthesia    DVT (deep venous thrombosis) (HCC) 1960   right leg  was on BC pills   Dysrhythmia    Family history of breast cancer    Family history of colon cancer    Family history of melanoma    Family history of prostate cancer    Fibrocystic breast disease    GERD (gastroesophageal reflux disease)    Headache    migraine   Heart murmur    not heard now.   History of kidney stones    Hyperlipidemia    Hypertension    IBS (irritable bowel syndrome)    Lumbago    Mitral valve prolapse    Neuropathy    Obstructive sleep apnea    Osteoarthritis of shoulder    PONV (postoperative nausea and vomiting)     Past Surgical History:  Procedure Laterality Date   ABDOMINAL HYSTERECTOMY     BACK SURGERY     BREAST CYST ASPIRATION Left 2001   Negative   CARDIAC ELECTROPHYSIOLOGY MAPPING AND ABLATION     EYE SURGERY     HUMERUS IM NAIL Right 05/05/2022   Procedure: INTRAMEDULLARY (IM) NAIL HUMERAL;  Surgeon: Roby Lofts, MD;  Location: [facility];  Service: Orthopedics;  Laterality: Right;   HYSTEROTOMY     L4-L5  2020   titaum rebok cage   LEFT HEART CATH AND CORONARY ANGIOGRAPHY N/A 11/01/2022   Procedure: LEFT HEART CATH AND CORONARY  ANGIOGRAPHY and possible PCI and stent;  Surgeon: Laurier Nancy, MD;  Location: [facility] INVASIVE CV LAB;  Service: Cardiovascular;  Laterality: N/A;   TOTAL HIP ARTHROPLASTY Left     Social History   Socioeconomic History   Marital status: Married    Spouse name: Not on file   Number of children: Not on file   Years of education: Not on file   Highest education level: Not on file  Occupational History   Not on file  Tobacco Use   Smoking status: Never   Smokeless tobacco: Never  Vaping Use   Vaping Use: Never used  Substance and Sexual Activity   Alcohol use: No   Drug use: No   Sexual activity: Not on file  Other Topics Concern   Not on file  Social History Narrative   Not on file   Social Determinants of Health   Financial Resource Strain: Not on file  Food Insecurity: No Food Insecurity (10/31/2022)   Hunger Vital Sign    Worried About Running Out of Food in the Last Year: Never true  Ran Out of Food in the Last Year: Never true  Transportation Needs: No Transportation Needs (10/31/2022)   PRAPARE - Administrator, Civil Service (Medical): No    Lack of Transportation (Non-Medical): No  Physical Activity: Not on file  Stress: Not on file  Social Connections: Not on file  Intimate Partner Violence: Not At Risk (10/31/2022)   Humiliation, Afraid, Rape, and Kick questionnaire    Fear of Current or Ex-Partner: No    Emotionally Abused: No    Physically Abused: No    Sexually Abused: No    Family History  Problem Relation Age of Onset   Colon cancer Mother 85   Prostate cancer Father        dx 30s   Cancer Sister    Acute myelogenous leukemia Sister        dx 37s   Prostate cancer Brother    Melanoma Brother    Prostate cancer Brother    Melanoma Brother    HIV/AIDS Brother 37   Tuberculosis Paternal Aunt    Melanoma Other    Breast cancer Other    Melanoma Niece     Allergies  Allergen Reactions   Iodinated Contrast Media Shortness Of  Breath and Other (See Comments)    Can be premedicated  Have been able to tolerate if given premeds (benadryl, prednisone)   Ibuprofen Other (See Comments)    "stomach problems"    Lubiprostone Nausea And Vomiting        Sulfa Antibiotics Other (See Comments)    "Stomach problems"  "Stomach problems"  "Stomach problems"  "Stomach problems"   Zolpidem Other (See Comments)    "hallucinations"  Other reaction(s): Confusion  Other Reaction(s): confusion   Boniva [Ibandronic Acid] Diarrhea and Nausea And Vomiting   Celecoxib Other (See Comments)    GI upset    Doxycycline Diarrhea and Nausea And Vomiting   Duloxetine Other (See Comments)    Over sedation with Zelnorm (IBS med)   Tetracyclines & Related Nausea Only and Nausea And Vomiting    Review of Systems  Constitutional: Negative.   HENT: Negative.    Eyes: Negative.   Respiratory: Negative.  Negative for cough and shortness of breath.   Cardiovascular: Negative.  Negative for chest pain, palpitations and leg swelling.  Gastrointestinal: Negative.  Negative for abdominal pain, constipation, diarrhea, heartburn, nausea and vomiting.  Genitourinary: Negative.  Negative for dysuria and flank pain.  Musculoskeletal:  Positive for joint pain. Negative for myalgias.  Skin: Negative.   Neurological: Negative.  Negative for dizziness and headaches.  Endo/Heme/Allergies: Negative.   Psychiatric/Behavioral: Negative.  Negative for depression and suicidal ideas. The patient is not nervous/anxious.        Objective:   BP (!) 142/64   Pulse (!) 57   Ht 5\' 11"  (1.803 m)   Wt 180 lb 12.8 oz (82 kg)   SpO2 96%   BMI 25.22 kg/m   Vitals:   04/12/23 1059  BP: (!) 142/64  Pulse: (!) 57  Height: 5\' 11"  (1.803 m)  Weight: 180 lb 12.8 oz (82 kg)  SpO2: 96%  BMI (Calculated): 25.23    Physical Exam Vitals and nursing note reviewed.  Constitutional:      Appearance: Normal appearance.  HENT:     Head: Normocephalic  and atraumatic.     Nose: Nose normal.     Mouth/Throat:     Mouth: Mucous membranes are moist.     Pharynx: Oropharynx is  clear.  Eyes:     Conjunctiva/sclera: Conjunctivae normal.     Pupils: Pupils are equal, round, and reactive to light.  Cardiovascular:     Rate and Rhythm: Normal rate and regular rhythm.     Pulses: Normal pulses.     Heart sounds: Normal heart sounds. No murmur heard. Pulmonary:     Effort: Pulmonary effort is normal.     Breath sounds: Normal breath sounds. No wheezing.  Abdominal:     General: Bowel sounds are normal.     Palpations: Abdomen is soft.     Tenderness: There is no abdominal tenderness. There is no right CVA tenderness or left CVA tenderness.  Musculoskeletal:        General: No swelling. Normal range of motion.     Cervical back: Normal range of motion.  Skin:    General: Skin is warm and dry.  Neurological:     General: No focal deficit present.     Mental Status: She is alert and oriented to person, place, and time.  Psychiatric:        Mood and Affect: Mood normal.        Behavior: Behavior normal.      No results found for any visits on 04/12/23.  Recent Results (from the past 2160 hour(s))  POCT XPERT XPRESS SARS COVID-2/FLU/RSV [[id number]]     Status: Abnormal   Collection Time: 03/11/23 10:11 AM  Result Value Ref Range   SARS Coronavirus 2 Positive    FLU A Negative    FLU B Negative    RSV RNA, PCR Negative   Hemoglobin A1c     Status: None   Collection Time: 03/23/23 10:18 AM  Result Value Ref Range   Hgb A1c MFr Bld 5.6 4.8 - 5.6 %    Comment:          Prediabetes: 5.7 - 6.4          Diabetes: >6.4          Glycemic control for adults with diabetes: <7.0    Est. average glucose Bld gHb Est-mCnc 114 mg/dL  TSH     Status: None   Collection Time: 03/23/23 10:18 AM  Result Value Ref Range   TSH 0.909 0.450 - 4.500 uIU/mL  CMP14+EGFR     Status: Abnormal   Collection Time: 03/23/23 10:18 AM  Result Value Ref  Range   Glucose 91 70 - 99 mg/dL   BUN 15 8 - 27 mg/dL   Creatinine, Ser 8.29 0.57 - 1.00 mg/dL   eGFR 88 >56 OZ/HYQ/6.57   BUN/Creatinine Ratio 23 12 - 28   Sodium 144 134 - 144 mmol/L   Potassium 4.4 3.5 - 5.2 mmol/L   Chloride 107 (H) 96 - 106 mmol/L   CO2 26 20 - 29 mmol/L   Calcium 9.5 8.7 - 10.3 mg/dL   Total Protein 5.7 (L) 6.0 - 8.5 g/dL   Albumin 3.9 3.7 - 4.7 g/dL   Globulin, Total 1.8 1.5 - 4.5 g/dL   Bilirubin Total 0.5 0.0 - 1.2 mg/dL   Alkaline Phosphatase 104 44 - 121 IU/L   AST 21 0 - 40 IU/L   ALT 14 0 - 32 IU/L  Lipid panel     Status: None   Collection Time: 03/23/23 10:18 AM  Result Value Ref Range   Cholesterol, Total 135 100 - 199 mg/dL   Triglycerides 846 0 - 149 mg/dL   HDL 59 >96 mg/dL   VLDL  Cholesterol Cal 19 5 - 40 mg/dL   LDL Chol Calc (NIH) 57 0 - 99 mg/dL   Chol/HDL Ratio 2.3 0.0 - 4.4 ratio    Comment:                                   T. Chol/HDL Ratio                                             Men  Women                               1/2 Avg.Risk  3.4    3.3                                   Avg.Risk  5.0    4.4                                2X Avg.Risk  9.6    7.1                                3X Avg.Risk 23.4   11.0   CBC With Differential     Status: Abnormal   Collection Time: 03/23/23 10:18 AM  Result Value Ref Range   WBC 3.9 3.4 - 10.8 x10E3/uL   RBC 3.55 (L) 3.77 - 5.28 x10E6/uL   Hemoglobin 10.4 (L) 11.1 - 15.9 g/dL   Hematocrit 16.1 (L) 09.6 - 46.6 %   MCV 92 79 - 97 fL   MCH 29.3 26.6 - 33.0 pg   MCHC 31.9 31.5 - 35.7 g/dL   RDW 04.5 40.9 - 81.1 %   Neutrophils 55 Not Estab. %   Lymphs 36 Not Estab. %   Monocytes 6 Not Estab. %   Eos 2 Not Estab. %   Basos 1 Not Estab. %   Neutrophils Absolute 2.2 1.4 - 7.0 x10E3/uL   Lymphocytes Absolute 1.4 0.7 - 3.1 x10E3/uL   Monocytes Absolute 0.2 0.1 - 0.9 x10E3/uL   EOS (ABSOLUTE) 0.1 0.0 - 0.4 x10E3/uL   Basophils Absolute 0.0 0.0 - 0.2 x10E3/uL   Immature Granulocytes 0  Not Estab. %   Immature Grans (Abs) 0.0 0.0 - 0.1 x10E3/uL    Comment: **Effective May 02, 2023, profile [id number] CBC/Differential**   (No Platelet) will be made non-orderable. [facility] Offers:   [id number] CBC With Differential/Platelet       Assessment & Plan:   Problem List Items Addressed This Visit     GERD (gastroesophageal reflux disease)   Chronic pain of lower extremity (Bilateral) (Chronic)   Hyperlipidemia   Chronic pain syndrome   Essential hypertension, benign - Primary    Return in about 3 months (around 07/13/2023).   Total time spent: 25 minutes  Margaretann Loveless, MD  04/12/2023   This document may have been prepared by [facility] Voice Recognition software and as such may include unintentional dictation errors.

## 2023-04-13 ENCOUNTER — Ambulatory Visit
Admission: RE | Admit: 2023-04-13 | Discharge: 2023-04-13 | Disposition: A | Discharge status: Home / Self Care | Payer: Medicare PPO | Source: Ambulatory Visit | Attending: Internal Medicine | Admitting: Internal Medicine

## 2023-04-13 DIAGNOSIS — R928 Other abnormal and inconclusive findings on diagnostic imaging of breast: Secondary | ICD-10-CM

## 2023-04-13 DIAGNOSIS — N6489 Other specified disorders of breast: Secondary | ICD-10-CM

## 2023-04-13 DIAGNOSIS — N6325 Unspecified lump in the left breast, overlapping quadrants: Secondary | ICD-10-CM | POA: Diagnosis not present

## 2023-04-14 ENCOUNTER — Other Ambulatory Visit: Payer: Self-pay | Admitting: Internal Medicine

## 2023-04-14 DIAGNOSIS — R928 Other abnormal and inconclusive findings on diagnostic imaging of breast: Secondary | ICD-10-CM

## 2023-04-14 DIAGNOSIS — N63 Unspecified lump in unspecified breast: Secondary | ICD-10-CM

## 2023-04-15 ENCOUNTER — Other Ambulatory Visit: Payer: Self-pay | Admitting: Internal Medicine

## 2023-04-15 DIAGNOSIS — N6322 Unspecified lump in the left breast, upper inner quadrant: Secondary | ICD-10-CM

## 2023-04-20 ENCOUNTER — Ambulatory Visit
Admission: RE | Admit: 2023-04-20 | Discharge: 2023-04-20 | Disposition: A | Discharge status: Home / Self Care | Payer: Medicare PPO | Source: Ambulatory Visit | Attending: Internal Medicine | Admitting: Internal Medicine

## 2023-04-20 DIAGNOSIS — N63 Unspecified lump in unspecified breast: Secondary | ICD-10-CM

## 2023-04-20 DIAGNOSIS — R928 Other abnormal and inconclusive findings on diagnostic imaging of breast: Secondary | ICD-10-CM | POA: Diagnosis not present

## 2023-04-20 DIAGNOSIS — N6325 Unspecified lump in the left breast, overlapping quadrants: Secondary | ICD-10-CM | POA: Diagnosis not present

## 2023-04-20 HISTORY — PX: BREAST BIOPSY: SHX20

## 2023-04-20 MED ORDER — LIDOCAINE-EPINEPHRINE 1 %-1:100000 IJ SOLN
10.0000 mL | Freq: Once | INTRAMUSCULAR | Status: AC
  Administered 2023-04-20: 10 mL via INTRADERMAL
  Filled 2023-04-20: qty 10

## 2023-04-20 MED ORDER — LIDOCAINE 1 % OPTIME INJ - NO CHARGE
5.0000 mL | Freq: Once | INTRAMUSCULAR | Status: AC
  Administered 2023-04-20: 5 mL via INTRADERMAL
  Filled 2023-04-20: qty 6

## 2023-05-09 ENCOUNTER — Telehealth: Payer: Self-pay

## 2023-05-10 NOTE — Telephone Encounter (Signed)
Spoke with pt who will be picking up samples and was informed of what provider stated.

## 2023-05-10 NOTE — Telephone Encounter (Signed)
[facility] with husband and have samples at nurses station.

## 2023-05-12 ENCOUNTER — Encounter: Payer: Self-pay | Admitting: Cardiovascular Disease

## 2023-05-12 ENCOUNTER — Encounter: Payer: Self-pay | Admitting: Internal Medicine

## 2023-05-25 DIAGNOSIS — G4733 Obstructive sleep apnea (adult) (pediatric): Secondary | ICD-10-CM | POA: Diagnosis not present

## 2023-06-03 ENCOUNTER — Telehealth: Payer: Self-pay

## 2023-06-03 ENCOUNTER — Other Ambulatory Visit: Payer: Self-pay

## 2023-06-03 MED ORDER — WARFARIN SODIUM 3 MG PO TABS: 3.0000 mg | ORAL_TABLET | Freq: Every day | ORAL | 0 refills | Status: DC

## 2023-06-03 NOTE — Telephone Encounter (Signed)
Spoke with pt who verbalized understanding.

## 2023-06-09 ENCOUNTER — Other Ambulatory Visit: Payer: Self-pay

## 2023-06-10 ENCOUNTER — Other Ambulatory Visit: Payer: Self-pay

## 2023-06-13 DIAGNOSIS — D2262 Melanocytic nevi of left upper limb, including shoulder: Secondary | ICD-10-CM | POA: Diagnosis not present

## 2023-06-13 DIAGNOSIS — C44629 Squamous cell carcinoma of skin of left upper limb, including shoulder: Secondary | ICD-10-CM | POA: Diagnosis not present

## 2023-06-13 DIAGNOSIS — D2272 Melanocytic nevi of left lower limb, including hip: Secondary | ICD-10-CM | POA: Diagnosis not present

## 2023-06-13 DIAGNOSIS — Z85820 Personal history of malignant melanoma of skin: Secondary | ICD-10-CM | POA: Diagnosis not present

## 2023-06-13 DIAGNOSIS — D045 Carcinoma in situ of skin of trunk: Secondary | ICD-10-CM | POA: Diagnosis not present

## 2023-06-13 DIAGNOSIS — D0471 Carcinoma in situ of skin of right lower limb, including hip: Secondary | ICD-10-CM | POA: Diagnosis not present

## 2023-06-13 DIAGNOSIS — D0461 Carcinoma in situ of skin of right upper limb, including shoulder: Secondary | ICD-10-CM | POA: Diagnosis not present

## 2023-06-13 DIAGNOSIS — D225 Melanocytic nevi of trunk: Secondary | ICD-10-CM | POA: Diagnosis not present

## 2023-06-13 DIAGNOSIS — D2261 Melanocytic nevi of right upper limb, including shoulder: Secondary | ICD-10-CM | POA: Diagnosis not present

## 2023-06-13 DIAGNOSIS — D2271 Melanocytic nevi of right lower limb, including hip: Secondary | ICD-10-CM | POA: Diagnosis not present

## 2023-06-13 DIAGNOSIS — L570 Actinic keratosis: Secondary | ICD-10-CM | POA: Diagnosis not present

## 2023-06-13 DIAGNOSIS — D485 Neoplasm of uncertain behavior of skin: Secondary | ICD-10-CM | POA: Diagnosis not present

## 2023-06-13 DIAGNOSIS — Z85828 Personal history of other malignant neoplasm of skin: Secondary | ICD-10-CM | POA: Diagnosis not present

## 2023-06-24 ENCOUNTER — Other Ambulatory Visit: Payer: Medicare PPO

## 2023-06-24 ENCOUNTER — Other Ambulatory Visit: Payer: Self-pay | Admitting: Internal Medicine

## 2023-06-24 DIAGNOSIS — I82531 Chronic embolism and thrombosis of right popliteal vein: Secondary | ICD-10-CM | POA: Diagnosis not present

## 2023-06-24 DIAGNOSIS — R7301 Impaired fasting glucose: Secondary | ICD-10-CM

## 2023-06-24 DIAGNOSIS — I10 Essential (primary) hypertension: Secondary | ICD-10-CM

## 2023-06-24 DIAGNOSIS — E782 Mixed hyperlipidemia: Secondary | ICD-10-CM

## 2023-06-25 LAB — PROTIME-INR
INR: 3.9 — ABNORMAL HIGH (ref 0.9–1.2)
Prothrombin Time: 39.2 s — ABNORMAL HIGH (ref 9.1–12.0)

## 2023-06-27 NOTE — Progress Notes (Signed)
Patient notified

## 2023-07-04 ENCOUNTER — Other Ambulatory Visit: Payer: Self-pay | Admitting: Internal Medicine

## 2023-07-04 ENCOUNTER — Other Ambulatory Visit: Payer: Medicare PPO

## 2023-07-04 DIAGNOSIS — I82531 Chronic embolism and thrombosis of right popliteal vein: Secondary | ICD-10-CM | POA: Diagnosis not present

## 2023-07-05 ENCOUNTER — Encounter: Payer: Self-pay | Admitting: Internal Medicine

## 2023-07-05 LAB — PROTIME-INR
INR: 3.2 — ABNORMAL HIGH (ref 0.9–1.2)
Prothrombin Time: 32.4 s — ABNORMAL HIGH (ref 9.1–12.0)

## 2023-07-07 NOTE — Progress Notes (Signed)
Patient notified

## 2023-07-11 ENCOUNTER — Other Ambulatory Visit: Payer: Self-pay

## 2023-07-11 MED ORDER — WARFARIN SODIUM 3 MG PO TABS: 3.0000 mg | ORAL_TABLET | Freq: Every day | ORAL | 6 refills | Status: DC

## 2023-07-11 MED ORDER — ALENDRONATE SODIUM 70 MG PO TABS: 70.0000 mg | ORAL_TABLET | ORAL | 2 refills | Status: DC

## 2023-07-12 ENCOUNTER — Other Ambulatory Visit: Payer: Medicare PPO

## 2023-07-12 ENCOUNTER — Other Ambulatory Visit: Payer: Self-pay

## 2023-07-12 DIAGNOSIS — I10 Essential (primary) hypertension: Secondary | ICD-10-CM | POA: Diagnosis not present

## 2023-07-12 DIAGNOSIS — E782 Mixed hyperlipidemia: Secondary | ICD-10-CM

## 2023-07-12 DIAGNOSIS — R7301 Impaired fasting glucose: Secondary | ICD-10-CM

## 2023-07-12 DIAGNOSIS — E669 Obesity, unspecified: Secondary | ICD-10-CM

## 2023-07-12 DIAGNOSIS — D2362 Other benign neoplasm of skin of left upper limb, including shoulder: Secondary | ICD-10-CM | POA: Diagnosis not present

## 2023-07-12 DIAGNOSIS — C44629 Squamous cell carcinoma of skin of left upper limb, including shoulder: Secondary | ICD-10-CM | POA: Diagnosis not present

## 2023-07-12 MED ORDER — ALENDRONATE SODIUM 70 MG PO TABS: 70.0000 mg | ORAL_TABLET | ORAL | 2 refills | Status: DC

## 2023-07-13 ENCOUNTER — Other Ambulatory Visit: Payer: Self-pay | Admitting: Internal Medicine

## 2023-07-13 LAB — LIPID PANEL
Chol/HDL Ratio: 2.3 {ratio} (ref 0.0–4.4)
Cholesterol, Total: 148 mg/dL (ref 100–199)
HDL: 64 mg/dL (ref >39)
LDL Chol Calc (NIH): 65 mg/dL (ref 0–99)
Triglycerides: 106 mg/dL (ref 0–149)
VLDL Cholesterol Cal: 19 mg/dL (ref 5–40)

## 2023-07-13 LAB — CMP14+EGFR
ALT: 8 [IU]/L (ref 0–32)
AST: 14 [IU]/L (ref 0–40)
Albumin: 3.9 g/dL (ref 3.7–4.7)
Alkaline Phosphatase: 65 [IU]/L (ref 44–121)
BUN/Creatinine Ratio: 21 (ref 12–28)
BUN: 19 mg/dL (ref 8–27)
Bilirubin Total: 0.3 mg/dL (ref 0.0–1.2)
CO2: 24 mmol/L (ref 20–29)
Calcium: 9.6 mg/dL (ref 8.7–10.3)
Chloride: 104 mmol/L (ref 96–106)
Creatinine, Ser: 0.91 mg/dL (ref 0.57–1.00)
Globulin, Total: 1.8 g/dL (ref 1.5–4.5)
Glucose: 89 mg/dL (ref 70–99)
Potassium: 5.4 mmol/L — ABNORMAL HIGH (ref 3.5–5.2)
Sodium: 144 mmol/L (ref 134–144)
Total Protein: 5.7 g/dL — ABNORMAL LOW (ref 6.0–8.5)
eGFR: 63 mL/min/{1.73_m2} (ref >59)

## 2023-07-13 LAB — TSH: TSH: 1.01 u[IU]/mL (ref 0.450–4.500)

## 2023-07-13 LAB — HEMOGLOBIN A1C
Est. average glucose Bld gHb Est-mCnc: 114 mg/dL
Hgb A1c MFr Bld: 5.6 % (ref 4.8–5.6)

## 2023-07-14 ENCOUNTER — Encounter: Payer: Self-pay | Admitting: Internal Medicine

## 2023-07-14 ENCOUNTER — Ambulatory Visit: Payer: Medicare PPO | Admitting: Internal Medicine

## 2023-07-14 VITALS — BP 120/72 | HR 53 | Ht 70.0 in | Wt 188.6 lb

## 2023-07-14 DIAGNOSIS — Z23 Encounter for immunization: Secondary | ICD-10-CM | POA: Diagnosis not present

## 2023-07-14 DIAGNOSIS — I10 Essential (primary) hypertension: Secondary | ICD-10-CM | POA: Diagnosis not present

## 2023-07-14 DIAGNOSIS — E782 Mixed hyperlipidemia: Secondary | ICD-10-CM | POA: Diagnosis not present

## 2023-07-14 DIAGNOSIS — I82531 Chronic embolism and thrombosis of right popliteal vein: Secondary | ICD-10-CM

## 2023-07-14 DIAGNOSIS — G4733 Obstructive sleep apnea (adult) (pediatric): Secondary | ICD-10-CM

## 2023-07-14 DIAGNOSIS — J301 Allergic rhinitis due to pollen: Secondary | ICD-10-CM

## 2023-07-14 MED ORDER — AZELASTINE HCL 0.1 % NA SOLN: 2.0000 | Freq: Every day | NASAL | 12 refills | Status: DC

## 2023-07-14 NOTE — Progress Notes (Signed)
Established Patient Office Visit  Subjective:  Patient ID: Sarah Riddle, female    DOB: June 22, 1941  Age: 82 y.o. MRN: [id number]  Chief Complaint  Patient presents with   Follow-up    3 month follow up    Patient is here for her follow-up today.  She is generally feeling well and is taking all her medications regularly including warfarin. Her most recent PT/INR was therapeutic.  Used to take Eliquis but had to switch to warfarin due to expense. Recently completed her mammogram. Labs were done earlier, results discussed today. Needs flu vaccine. Reports of difficulty sleeping at night despite taking the high-dose gabapentin.  Will try adding OTC melatonin.    No other concerns at this time.   Past Medical History:  Diagnosis Date   A-fib [facility])    Accelerated hypertension 10/31/2022   Age related osteoporosis    Cancer (HCC)    Melanoma x 2 -- Lt Leg -1976 Rt Arm - 2014   Chronic bilateral low back pain    Chronic kidney disease    Complication of anesthesia    DVT (deep venous thrombosis) (HCC) 1960   right leg  was on BC pills   Dysrhythmia    Family history of breast cancer    Family history of colon cancer    Family history of melanoma    Family history of prostate cancer    Fibrocystic breast disease    GERD (gastroesophageal reflux disease)    Headache    migraine   Heart murmur    not heard now.   History of kidney stones    Hyperlipidemia    Hypertension    IBS (irritable bowel syndrome)    Lumbago    Mitral valve prolapse    Neuropathy    Obstructive sleep apnea    Osteoarthritis of shoulder    PONV (postoperative nausea and vomiting)     Past Surgical History:  Procedure Laterality Date   ABDOMINAL HYSTERECTOMY     BACK SURGERY     BREAST BIOPSY Left 04/20/2023   [place] bx/ heart clip/ path pending   BREAST BIOPSY Left 04/20/2023   [place] LT BREAST BX W LOC DEV 1ST LESION IMG BX SPEC US GUIDE 04/20/2023 [facility]MO[facility]HY   BREAST CYST ASPIRATION  Left 2001   Negative   CARDIAC ELECTROPHYSIOLOGY MAPPING AND ABLATION     EYE SURGERY     HUMERUS IM NAIL Right 05/05/2022   Procedure: INTRAMEDULLARY (IM) NAIL HUMERAL;  Surgeon: Roby Lofts, MD;  Location: [facility] OR;  Service: Orthopedics;  Laterality: Right;   HYSTEROTOMY     L4-L5  2020   titaum rebok cage   LEFT HEART CATH AND CORONARY ANGIOGRAPHY N/A 11/01/2022   Procedure: LEFT HEART CATH AND CORONARY ANGIOGRAPHY and possible PCI and stent;  Surgeon: Laurier Nancy, MD;  Location: [facility] INVASIVE CV LAB;  Service: Cardiovascular;  Laterality: N/A;   TOTAL HIP ARTHROPLASTY Left     Social History   Socioeconomic History   Marital status: Married    Spouse name: Not on file   Number of children: Not on file   Years of education: Not on file   Highest education level: Not on file  Occupational History   Not on file  Tobacco Use   Smoking status: Never   Smokeless tobacco: Never  Vaping Use   Vaping status: Never Used  Substance and Sexual Activity   Alcohol use: No   Drug use: No  Sexual activity: Not on file  Other Topics Concern   Not on file  Social History Narrative   Not on file   Social Determinants of Health   Financial Resource Strain: Not on file  Food Insecurity: No Food Insecurity (10/31/2022)   Hunger Vital Sign    Worried About Running Out of Food in the Last Year: Never true    Ran Out of Food in the Last Year: Never true  Transportation Needs: No Transportation Needs (10/31/2022)   PRAPARE - Administrator, Civil Service (Medical): No    Lack of Transportation (Non-Medical): No  Physical Activity: Not on file  Stress: Not on file  Social Connections: Not on file  Intimate Partner Violence: Not At Risk (10/31/2022)   Humiliation, Afraid, Rape, and Kick questionnaire    Fear of Current or Ex-Partner: No    Emotionally Abused: No    Physically Abused: No    Sexually Abused: No    Family History  Problem Relation Age of Onset    Colon cancer Mother 83   Prostate cancer Father        dx 21s   Cancer Sister    Acute myelogenous leukemia Sister        dx 71s   Prostate cancer Brother    Melanoma Brother    Prostate cancer Brother    Melanoma Brother    HIV/AIDS Brother 42   Tuberculosis Paternal Aunt    Melanoma Other    Breast cancer Other    Melanoma Niece     Allergies  Allergen Reactions   Iodinated Contrast Media Shortness Of Breath and Other (See Comments)    Can be premedicated  Have been able to tolerate if given premeds (benadryl, prednisone)   Ibuprofen Other (See Comments)    "stomach problems"    Lubiprostone Nausea And Vomiting        Sulfa Antibiotics Other (See Comments)    "Stomach problems"  "Stomach problems"  "Stomach problems"  "Stomach problems"   Zolpidem Other (See Comments)    "hallucinations"  Other reaction(s): Confusion  Other Reaction(s): confusion   Boniva [Ibandronic Acid] Diarrhea and Nausea And Vomiting   Celecoxib Other (See Comments)    GI upset    Doxycycline Diarrhea and Nausea And Vomiting   Duloxetine Other (See Comments)    Over sedation with Zelnorm (IBS med)   Tetracyclines & Related Nausea Only and Nausea And Vomiting    Review of Systems  Constitutional: Negative.  Negative for chills, diaphoresis, fever, malaise/fatigue and weight loss.  HENT: Negative.  Negative for sore throat.   Eyes: Negative.   Respiratory: Negative.  Negative for cough and shortness of breath.   Cardiovascular: Negative.  Negative for chest pain, palpitations and leg swelling.  Gastrointestinal: Negative.  Negative for abdominal pain, constipation, diarrhea, heartburn, nausea and vomiting.  Genitourinary: Negative.  Negative for dysuria and flank pain.  Musculoskeletal: Negative.  Negative for joint pain and myalgias.  Skin: Negative.   Neurological: Negative.  Negative for dizziness, tingling, tremors and headaches.  Endo/Heme/Allergies: Negative.    Psychiatric/Behavioral:  Negative for depression and suicidal ideas. The patient has insomnia. The patient is not nervous/anxious.        Objective:   BP 120/72   Pulse (!) 53   Ht 5\' 10"  (1.778 m)   Wt 188 lb 9.6 oz (85.5 kg)   SpO2 97%   BMI 27.06 kg/m   Vitals:   07/14/23 1019  BP: 120/72  Pulse: (!) 53  Height: 5\' 10"  (1.778 m)  Weight: 188 lb 9.6 oz (85.5 kg)  SpO2: 97%  BMI (Calculated): 27.06    Physical Exam Vitals and nursing note reviewed.  Constitutional:      Appearance: Normal appearance.  HENT:     Head: Normocephalic and atraumatic.     Nose: Nose normal.     Mouth/Throat:     Mouth: Mucous membranes are moist.     Pharynx: Oropharynx is clear.  Eyes:     Conjunctiva/sclera: Conjunctivae normal.     Pupils: Pupils are equal, round, and reactive to light.  Cardiovascular:     Rate and Rhythm: Normal rate and regular rhythm.     Pulses: Normal pulses.     Heart sounds: Normal heart sounds. No murmur heard. Pulmonary:     Effort: Pulmonary effort is normal.     Breath sounds: Normal breath sounds. No wheezing.  Abdominal:     General: Bowel sounds are normal.     Palpations: Abdomen is soft.     Tenderness: There is no abdominal tenderness. There is no right CVA tenderness or left CVA tenderness.  Musculoskeletal:        General: Normal range of motion.     Cervical back: Normal range of motion.     Right lower leg: No edema.     Left lower leg: No edema.  Skin:    General: Skin is warm and dry.  Neurological:     General: No focal deficit present.     Mental Status: She is alert and oriented to person, place, and time.  Psychiatric:        Mood and Affect: Mood normal.        Behavior: Behavior normal.      No results found for any visits on 07/14/23.  Recent Results (from the past 2160 hour(s))  Protime-INR     Status: Abnormal   Collection Time: 06/24/23  1:47 PM  Result Value Ref Range   INR 3.9 (H) 0.9 - 1.2    Comment:  Reference interval is for non-anticoagulated patients. Suggested INR therapeutic range for Vitamin K antagonist therapy:    Standard Dose (moderate intensity                   therapeutic range):       2.0 - 3.0    Higher intensity therapeutic range       2.5 - 3.5    Prothrombin Time 39.2 (H) 9.1 - 12.0 sec  Protime-INR     Status: Abnormal   Collection Time: 07/04/23  9:41 AM  Result Value Ref Range   INR 3.2 (H) 0.9 - 1.2    Comment: Reference interval is for non-anticoagulated patients. Suggested INR therapeutic range for Vitamin K antagonist therapy:    Standard Dose (moderate intensity                   therapeutic range):       2.0 - 3.0    Higher intensity therapeutic range       2.5 - 3.5    Prothrombin Time 32.4 (H) 9.1 - 12.0 sec  Hemoglobin A1c     Status: None   Collection Time: 07/12/23  8:57 AM  Result Value Ref Range   Hgb A1c MFr Bld 5.6 4.8 - 5.6 %    Comment:          Prediabetes: 5.7 - 6.4  Diabetes: >6.4          Glycemic control for adults with diabetes: <7.0    Est. average glucose Bld gHb Est-mCnc 114 mg/dL  TSH     Status: None   Collection Time: 07/12/23  8:57 AM  Result Value Ref Range   TSH 1.010 0.450 - 4.500 uIU/mL  CMP14+EGFR     Status: Abnormal   Collection Time: 07/12/23  8:57 AM  Result Value Ref Range   Glucose 89 70 - 99 mg/dL   BUN 19 8 - 27 mg/dL   Creatinine, Ser 1.61 0.57 - 1.00 mg/dL   eGFR 63 >09 UE/AVW/0.98   BUN/Creatinine Ratio 21 12 - 28   Sodium 144 134 - 144 mmol/L   Potassium 5.4 (H) 3.5 - 5.2 mmol/L   Chloride 104 96 - 106 mmol/L   CO2 24 20 - 29 mmol/L   Calcium 9.6 8.7 - 10.3 mg/dL   Total Protein 5.7 (L) 6.0 - 8.5 g/dL   Albumin 3.9 3.7 - 4.7 g/dL   Globulin, Total 1.8 1.5 - 4.5 g/dL   Bilirubin Total 0.3 0.0 - 1.2 mg/dL   Alkaline Phosphatase 65 44 - 121 IU/L   AST 14 0 - 40 IU/L   ALT 8 0 - 32 IU/L  Lipid panel     Status: None   Collection Time: 07/12/23  8:57 AM  Result Value Ref Range    Cholesterol, Total 148 100 - 199 mg/dL   Triglycerides 119 0 - 149 mg/dL   HDL 64 >14 mg/dL   VLDL Cholesterol Cal 19 5 - 40 mg/dL   LDL Chol Calc (NIH) 65 0 - 99 mg/dL   Chol/HDL Ratio 2.3 0.0 - 4.4 ratio    Comment:                                   T. Chol/HDL Ratio                                             Men  Women                               1/2 Avg.Risk  3.4    3.3                                   Avg.Risk  5.0    4.4                                2X Avg.Risk  9.6    7.1                                3X Avg.Risk 23.4   11.0       Assessment & Plan:  Continue current medications. Monitor PT/INR regularly. Flu vaccine today. Problem List Items Addressed This Visit     Allergic rhinitis   Hyperlipidemia   Obstructive sleep apnea (adult) (pediatric)   Essential hypertension, benign - Primary   Other Visit Diagnoses     Need for immunization against  influenza       Relevant Orders   Flu Vaccine Trivalent High Dose (Fluad) (Completed)   Chronic deep vein thrombosis (DVT) of popliteal vein of right lower extremity (HCC)           Return in about 4 months (around 11/14/2023).   Total time spent: 30 minutes  Margaretann Loveless, MD  07/14/2023   This document may have been prepared by [facility] Voice Recognition software and as such may include unintentional dictation errors.

## 2023-07-15 ENCOUNTER — Other Ambulatory Visit: Payer: Self-pay | Admitting: Internal Medicine

## 2023-07-18 MED ORDER — LEVOCETIRIZINE DIHYDROCHLORIDE 5 MG PO TABS: 5.0000 mg | ORAL_TABLET | Freq: Every evening | ORAL | 1 refills | Status: DC

## 2023-07-25 ENCOUNTER — Other Ambulatory Visit: Payer: Self-pay | Admitting: Cardiovascular Disease

## 2023-07-26 DIAGNOSIS — D0461 Carcinoma in situ of skin of right upper limb, including shoulder: Secondary | ICD-10-CM | POA: Diagnosis not present

## 2023-07-26 DIAGNOSIS — D0471 Carcinoma in situ of skin of right lower limb, including hip: Secondary | ICD-10-CM | POA: Diagnosis not present

## 2023-07-26 DIAGNOSIS — D045 Carcinoma in situ of skin of trunk: Secondary | ICD-10-CM | POA: Diagnosis not present

## 2023-07-27 ENCOUNTER — Other Ambulatory Visit: Payer: Self-pay | Admitting: Internal Medicine

## 2023-07-27 ENCOUNTER — Other Ambulatory Visit: Payer: Medicare PPO

## 2023-07-27 DIAGNOSIS — I82531 Chronic embolism and thrombosis of right popliteal vein: Secondary | ICD-10-CM | POA: Diagnosis not present

## 2023-07-28 LAB — PROTIME-INR
INR: 4.8 — ABNORMAL HIGH (ref 0.9–1.2)
Prothrombin Time: 47.5 s — ABNORMAL HIGH (ref 9.1–12.0)

## 2023-08-01 ENCOUNTER — Encounter: Payer: Self-pay | Admitting: Internal Medicine

## 2023-08-05 ENCOUNTER — Encounter: Payer: Self-pay | Admitting: Oncology

## 2023-08-05 ENCOUNTER — Other Ambulatory Visit: Payer: Self-pay

## 2023-08-05 ENCOUNTER — Inpatient Hospital Stay: Payer: Medicare PPO | Attending: Oncology

## 2023-08-05 ENCOUNTER — Inpatient Hospital Stay: Payer: Medicare PPO | Admitting: Oncology

## 2023-08-05 DIAGNOSIS — Z808 Family history of malignant neoplasm of other organs or systems: Secondary | ICD-10-CM | POA: Insufficient documentation

## 2023-08-05 DIAGNOSIS — Z806 Family history of leukemia: Secondary | ICD-10-CM | POA: Diagnosis not present

## 2023-08-05 DIAGNOSIS — Z7901 Long term (current) use of anticoagulants: Secondary | ICD-10-CM | POA: Insufficient documentation

## 2023-08-05 DIAGNOSIS — Z800 Family history of malignant neoplasm of digestive organs: Secondary | ICD-10-CM | POA: Insufficient documentation

## 2023-08-05 DIAGNOSIS — Z8042 Family history of malignant neoplasm of prostate: Secondary | ICD-10-CM | POA: Diagnosis not present

## 2023-08-05 DIAGNOSIS — Z803 Family history of malignant neoplasm of breast: Secondary | ICD-10-CM | POA: Insufficient documentation

## 2023-08-05 DIAGNOSIS — D649 Anemia, unspecified: Secondary | ICD-10-CM

## 2023-08-05 DIAGNOSIS — I4891 Unspecified atrial fibrillation: Secondary | ICD-10-CM | POA: Diagnosis not present

## 2023-08-05 LAB — CBC WITH DIFFERENTIAL/PLATELET
Abs Immature Granulocytes: 0.02 10*3/uL (ref 0.00–0.07)
Basophils Absolute: 0 10*3/uL (ref 0.0–0.1)
Basophils Relative: 1 %
Eosinophils Absolute: 0.3 10*3/uL (ref 0.0–0.5)
Eosinophils Relative: 5 %
HCT: 33.1 % — ABNORMAL LOW (ref 36.0–46.0)
Hemoglobin: 10.6 g/dL — ABNORMAL LOW (ref 12.0–15.0)
Immature Granulocytes: 0 %
Lymphocytes Relative: 24 %
Lymphs Abs: 1.4 10*3/uL (ref 0.7–4.0)
MCH: 29.9 pg (ref 26.0–34.0)
MCHC: 32 g/dL (ref 30.0–36.0)
MCV: 93.5 fL (ref 80.0–100.0)
Monocytes Absolute: 0.4 10*3/uL (ref 0.1–1.0)
Monocytes Relative: 7 %
Neutro Abs: 3.6 10*3/uL (ref 1.7–7.7)
Neutrophils Relative %: 63 %
Platelets: 196 10*3/uL (ref 150–400)
RBC: 3.54 MIL/uL — ABNORMAL LOW (ref 3.87–5.11)
RDW: 13 % (ref 11.5–15.5)
WBC: 5.8 10*3/uL (ref 4.0–10.5)
nRBC: 0 % (ref 0.0–0.2)

## 2023-08-05 LAB — HEPATIC FUNCTION PANEL
ALT: 10 U/L (ref 0–44)
AST: 15 U/L (ref 15–41)
Albumin: 3.7 g/dL (ref 3.5–5.0)
Alkaline Phosphatase: 58 U/L (ref 38–126)
Bilirubin, Direct: 0.2 mg/dL (ref 0.0–0.2)
Indirect Bilirubin: 0.8 mg/dL (ref 0.3–0.9)
Total Bilirubin: 1 mg/dL (ref 0.3–1.2)
Total Protein: 6.2 g/dL — ABNORMAL LOW (ref 6.5–8.1)

## 2023-08-05 LAB — FOLATE: Folate: 22.7 ng/mL (ref >5.9)

## 2023-08-05 LAB — BASIC METABOLIC PANEL - CANCER CENTER ONLY
Anion gap: 6 (ref 5–15)
BUN: 13 mg/dL (ref 8–23)
CO2: 28 mmol/L (ref 22–32)
Calcium: 9.1 mg/dL (ref 8.9–10.3)
Chloride: 106 mmol/L (ref 98–111)
Creatinine: 0.85 mg/dL (ref 0.44–1.00)
GFR, Estimated: >60 mL/min (ref >60)
Glucose, Bld: 93 mg/dL (ref 70–99)
Potassium: 3.6 mmol/L (ref 3.5–5.1)
Sodium: 140 mmol/L (ref 135–145)

## 2023-08-05 LAB — IRON AND TIBC
Iron: 58 ug/dL (ref 28–170)
Saturation Ratios: 21 % (ref 10.4–31.8)
TIBC: 277 ug/dL (ref 250–450)
UIBC: 219 ug/dL

## 2023-08-05 LAB — FERRITIN: Ferritin: 23 ng/mL (ref 11–307)

## 2023-08-05 NOTE — Assessment & Plan Note (Signed)
Iron level is not consistent with iron deficiency. Normal folate.  Recommend patient to take empiric B12 daily. Close monitor

## 2023-08-05 NOTE — Progress Notes (Signed)
Hematology/Oncology Progress note [phone] [phone]       Patient Care Team: Margaretann Loveless, MD as PCP - General (Internal Medicine) Rickard Patience, MD as Consulting Physician (Oncology)  ASSESSMENT & PLAN:   Hereditary hemochromatosis [facility]) #Hereditary hemochromatosis, homozygous HD63 mutation.  Labs reviewed and discussed with patient. Lab Results  Component Value Date   HGB 10.6 (L) 08/05/2023   TIBC 277 08/05/2023   IRONPCTSAT 21 08/05/2023   FERRITIN 23 08/05/2023     No signs of iron over load, no need for phlebotomy   Normocytic anemia Iron level is not consistent with iron deficiency. Normal folate.  Recommend patient to take empiric B12 daily. Close monitor  Orders Placed This Encounter  Procedures   Basic Metabolic Panel - Cancer Center Only    Standing Status:   Future    Number of Occurrences:   1    Standing Expiration Date:   08/04/2024   Folate    Standing Status:   Future    Number of Occurrences:   1    Standing Expiration Date:   08/04/2024   Follow up in 3 months.  All questions were answered. The patient knows to call the clinic with any problems, questions or concerns.  Rickard Patience, MD, PhD [facility] Hematology Oncology 08/05/2023   CHIEF COMPLAINTS/REASON FOR VISIT:  Follow up for homozygous hemochromatosis gene mutation, abnormal ultrasound liver  HISTORY OF PRESENTING ILLNESS:   Sarah Riddle is a  82 y.o.  female with PMH listed below was seen in consultation at the request of  Margaretann Loveless, MD  for evaluation of anemia Reviewed records from primary care provider 04/09/2021, patient had a CBC done which showed a hemoglobin of 10.9, MCV 89.  Normal total WBC, normal platelet count.  Creatinine is slightly elevated and 1.13, EGFR 49, normal bilirubin. TSH 1.48 6 05/12/2021, repeat hemoglobin 10.4, MCV 91, normal platelet and total WBC. Patient reports some fatigue.  Denies any abdominal pain, black or  bloody stool. Patient is on chronic anticoagulation with Coumadin for history of A. fib. Patient reports that she has previously taken oral iron supplementation which caused diarrhea.  She has a personal history of melanoma x2 status post resection by dermatologist.  She has a family history significant for father and 2 brothers with prostate cancer, a sister with acute leukemia.  Mother with colon cancer.  Patient reports to have colonoscopy done recently.  No colonoscopy records and currently picks  10/20/2020, [place] right upper quadrant showed coarse increased echogenicity of the parenchyma with no focal mass in the liver.  patient has establish care with gastroenterology Dr. Timothy Lasso 01/04/2022, ultrasound elastography of the liver showed diffusely increased hepatic parenchymal echogenicity with decreased acoustic through transmission most commonly reflects hepatic steatosis.  No overt hepatic lesion discovered. 01/18/2022, ultrasound right upper quadrant showed increased echogenicity of the liver parenchyma.  Family history of cancer, genetic testing was negative for pathogenic mutations.  INTERVAL HISTORY Sarah Riddle is a 82 y.o. female who has above history reviewed by me today presents for follow up visit for homozygous hemochromatosis gene mutation, abnormal liver ultrasound. Patient has no new complaints She is on coumadin for anticoagulation. Used to take Eliquis but had to switch to warfarin due to expense.  Also has been advise to take ferrous sulfate once daily, + dark stool, she attributes to being on iron pills.     Review of Systems  Constitutional:  Negative for appetite change, chills,  fatigue and fever.  HENT:   Negative for hearing loss and voice change.   Eyes:  Negative for eye problems.  Respiratory:  Negative for chest tightness and cough.   Cardiovascular:  Negative for chest pain.  Gastrointestinal:  Negative for abdominal distention, abdominal pain and blood in  stool.  Endocrine: Negative for hot flashes.  Genitourinary:  Negative for difficulty urinating and frequency.   Musculoskeletal:  Negative for arthralgias.  Skin:  Negative for itching and rash.  Neurological:  Negative for extremity weakness.  Hematological:  Negative for adenopathy.  Psychiatric/Behavioral:  Negative for confusion.     MEDICAL HISTORY:  Past Medical History:  Diagnosis Date   A-fib [facility])    Accelerated hypertension 10/31/2022   Age related osteoporosis    Cancer (HCC)    Melanoma x 2 -- Lt Leg -1976 Rt Arm - 2014   Chronic bilateral low back pain    Chronic kidney disease    Complication of anesthesia    DVT (deep venous thrombosis) (HCC) 1960   right leg  was on BC pills   Dysrhythmia    Family history of breast cancer    Family history of colon cancer    Family history of melanoma    Family history of prostate cancer    Fibrocystic breast disease    GERD (gastroesophageal reflux disease)    Headache    migraine   Heart murmur    not heard now.   History of kidney stones    Hyperlipidemia    Hypertension    IBS (irritable bowel syndrome)    Lumbago    Mitral valve prolapse    Neuropathy    Obstructive sleep apnea    Osteoarthritis of shoulder    PONV (postoperative nausea and vomiting)     SURGICAL HISTORY: Past Surgical History:  Procedure Laterality Date   ABDOMINAL HYSTERECTOMY     BACK SURGERY     BREAST BIOPSY Left 04/20/2023   [place] bx/ heart clip/ path pending   BREAST BIOPSY Left 04/20/2023   [place] LT BREAST BX W LOC DEV 1ST LESION IMG BX SPEC US GUIDE 04/20/2023 [facility]   BREAST CYST ASPIRATION Left 2001   Negative   CARDIAC ELECTROPHYSIOLOGY MAPPING AND ABLATION     EYE SURGERY     HUMERUS IM NAIL Right 05/05/2022   Procedure: INTRAMEDULLARY (IM) NAIL HUMERAL;  Surgeon: Roby Lofts, MD;  Location: [facility] OR;  Service: Orthopedics;  Laterality: Right;   HYSTEROTOMY     L4-L5  2020   titaum rebok cage   LEFT HEART CATH AND  CORONARY ANGIOGRAPHY N/A 11/01/2022   Procedure: LEFT HEART CATH AND CORONARY ANGIOGRAPHY and possible PCI and stent;  Surgeon: Laurier Nancy, MD;  Location: [facility] INVASIVE CV LAB;  Service: Cardiovascular;  Laterality: N/A;   TOTAL HIP ARTHROPLASTY Left     SOCIAL HISTORY: Social History   Socioeconomic History   Marital status: Married    Spouse name: Not on file   Number of children: Not on file   Years of education: Not on file   Highest education level: Not on file  Occupational History   Not on file  Tobacco Use   Smoking status: Never   Smokeless tobacco: Never  Vaping Use   Vaping status: Never Used  Substance and Sexual Activity   Alcohol use: No   Drug use: No   Sexual activity: Not on file  Other Topics Concern   Not on file  Social  History Narrative   Not on file   Social Determinants of Health   Financial Resource Strain: Not on file  Food Insecurity: No Food Insecurity (10/31/2022)   Hunger Vital Sign    Worried About Running Out of Food in the Last Year: Never true    Ran Out of Food in the Last Year: Never true  Transportation Needs: No Transportation Needs (10/31/2022)   PRAPARE - Administrator, Civil Service (Medical): No    Lack of Transportation (Non-Medical): No  Physical Activity: Not on file  Stress: Not on file  Social Connections: Not on file  Intimate Partner Violence: Not At Risk (10/31/2022)   Humiliation, Afraid, Rape, and Kick questionnaire    Fear of Current or Ex-Partner: No    Emotionally Abused: No    Physically Abused: No    Sexually Abused: No    FAMILY HISTORY: Family History  Problem Relation Age of Onset   Colon cancer Mother 55   Prostate cancer Father        dx 75s   Cancer Sister    Acute myelogenous leukemia Sister        dx 75s   Prostate cancer Brother    Melanoma Brother    Prostate cancer Brother    Melanoma Brother    HIV/AIDS Brother 59   Tuberculosis Paternal Aunt    Melanoma Other     Breast cancer Other    Melanoma Niece     ALLERGIES:  is allergic to iodinated contrast media, ibuprofen, lubiprostone, sulfa antibiotics, zolpidem, boniva [ibandronic acid], celecoxib, doxycycline, duloxetine, and tetracyclines & related.  MEDICATIONS:  Current Outpatient Medications  Medication Sig Dispense Refill   acetaminophen (TYLENOL) 500 MG tablet Take 500 mg by mouth every 6 (six) hours as needed. Once a day     alendronate (FOSAMAX) 70 MG tablet Take 1 tablet (70 mg total) by mouth every Thursday. Take with a full glass of water on an empty stomach. 12 tablet 2   allopurinol (ZYLOPRIM) 100 MG tablet Take 100 mg by mouth daily.     azelastine (ASTELIN) 0.1 % nasal spray Place 2 sprays into both nostrils at bedtime. Use in each nostril as directed 30 mL 12   Cranberry 125 MG TABS Take 125 mg by mouth daily.     cyanocobalamin (VITAMIN B12) 1000 MCG tablet Take 1,000 mcg by mouth daily. Monday to Friday     fluticasone (FLONASE) 50 MCG/ACT nasal spray Place 2 sprays into both nostrils daily.     gabapentin (NEURONTIN) 800 MG tablet TAKE 1 TABLET FOUR TIMES DAILY 360 tablet 3   hydrALAZINE (APRESOLINE) 25 MG tablet TAKE 1 TABLET TWICE DAILY 180 tablet 3   isosorbide mononitrate (IMDUR) 60 MG 24 hr tablet TAKE 1 TABLET EVERY DAY 90 tablet 3   levocetirizine (XYZAL) 5 MG tablet Take 1 tablet (5 mg total) by mouth every evening. 90 tablet 1   losartan (COZAAR) 25 MG tablet Take 25 mg by mouth daily.     metoprolol succinate (TOPROL-XL) 25 MG 24 hr tablet TAKE 1 TABLET EVERY DAY 90 tablet 3   pantoprazole (PROTONIX) 40 MG tablet TAKE 1 TABLET EVERY DAY 90 tablet 3   Polyethyl Glyc-Propyl Glyc PF (SYSTANE HYDRATION PF) 0.4-0.3 % SOLN Apply to eye.     rosuvastatin (CRESTOR) 40 MG tablet Take 40 mg by mouth at bedtime.     sertraline (ZOLOFT) 25 MG tablet Take 25 mg by mouth 3 (three) times daily. 2  tabs in AM 1 HS     spironolactone (ALDACTONE) 25 MG tablet Take 1 tablet (25 mg total) by  mouth daily. 30 tablet 11   Vitamin D, Ergocalciferol, 50000 units CAPS TAKE 1 CAPSULE BY MOUTH ONCE WEEKLY. 12 capsule 3   warfarin (COUMADIN) 3 MG tablet Take 1 tablet (3 mg total) by mouth daily. 30 tablet 6   EPINEPHrine 0.3 mg/0.3 mL IJ SOAJ injection Inject 0.3 mg into the muscle as needed. (Patient not taking: Reported on 07/14/2023)     ezetimibe (ZETIA) 10 MG tablet TAKE 1 TABLET EVERY DAY (Patient not taking: Reported on 07/14/2023) 90 tablet 3   meclizine (ANTIVERT) 12.5 MG tablet Take 1 tablet (12.5 mg total) by mouth 3 (three) times daily as needed for dizziness. (Patient not taking: Reported on 07/14/2023) 30 tablet 0   nitroGLYCERIN (NITROSTAT) 0.4 MG SL tablet Place 0.4 mg under the tongue every 5 (five) minutes as needed for chest pain. (Patient not taking: Reported on 07/14/2023)     tetrahydrozoline-zinc (VISINE-AC) 0.05-0.25 % ophthalmic solution Place 2 drops into both eyes 3 (three) times daily as needed (dry eyes). (Patient not taking: Reported on 07/14/2023)     No current facility-administered medications for this visit.     PHYSICAL EXAMINATION: ECOG PERFORMANCE STATUS: 1 - Symptomatic but completely ambulatory Vitals:   08/05/23 1151 08/05/23 1201  BP: (!) 153/65 (!) 144/65  Pulse: 64   Resp: 18   Temp: (!) 96.7 F (35.9 C)   SpO2: 97%    Filed Weights   08/05/23 1151  Weight: 192 lb 3.2 oz (87.2 kg)    Physical Exam Constitutional:      General: She is not in acute distress.    Comments: Patient ambulates independently  HENT:     Head: Normocephalic and atraumatic.  Eyes:     General: No scleral icterus. Cardiovascular:     Rate and Rhythm: Normal rate and regular rhythm.     Heart sounds: Normal heart sounds.  Pulmonary:     Effort: Pulmonary effort is normal. No respiratory distress.     Breath sounds: No wheezing.  Abdominal:     General: Bowel sounds are normal. There is no distension.     Palpations: Abdomen is soft.  Musculoskeletal:         General: No deformity. Normal range of motion.     Cervical back: Normal range of motion and neck supple.  Skin:    General: Skin is warm and dry.     Findings: No erythema or rash.  Neurological:     Mental Status: She is alert and oriented to person, place, and time. Mental status is at baseline.     Cranial Nerves: No cranial nerve deficit.     Coordination: Coordination normal.  Psychiatric:        Mood and Affect: Mood normal.     LABORATORY DATA:  I have reviewed the data as listed    Latest Ref Rng & Units 08/05/2023   11:37 AM 03/23/2023   10:18 AM 01/09/2023    6:00 PM  CBC  WBC 4.0 - 10.5 K/uL 5.8  3.9  4.5   Hemoglobin 12.0 - 15.0 g/dL 16.1  09.6  04.5   Hematocrit 36.0 - 46.0 % 33.1  32.6  37.4   Platelets 150 - 400 K/uL 196   163       Latest Ref Rng & Units 08/05/2023   11:37 AM 07/12/2023    8:57 AM  03/23/2023   10:18 AM  CMP  Glucose 70 - 99 mg/dL 93  89  91   BUN 8 - 23 mg/dL 13  19  15    Creatinine 0.44 - 1.00 mg/dL 6.29  5.28  4.13   Sodium 135 - 145 mmol/L 140  144  144   Potassium 3.5 - 5.1 mmol/L 3.6  5.4  4.4   Chloride 98 - 111 mmol/L 106  104  107   CO2 22 - 32 mmol/L 28  24  26    Calcium 8.9 - 10.3 mg/dL 9.1  9.6  9.5   Total Protein 6.5 - 8.1 g/dL 6.2  5.7  5.7   Total Bilirubin 0.3 - 1.2 mg/dL 1.0  0.3  0.5   Alkaline Phos 38 - 126 U/L 58  65  104   AST 15 - 41 U/L 15  14  21    ALT 0 - 44 U/L 10  8  14       Iron/TIBC/Ferritin/ %Sat    Component Value Date/Time   IRON 58 08/05/2023 1137   TIBC 277 08/05/2023 1137   FERRITIN 23 08/05/2023 1137   IRONPCTSAT 21 08/05/2023 1137      RADIOGRAPHIC STUDIES: I have personally reviewed the radiological images as listed and agreed with the findings in the report. No results found.

## 2023-08-05 NOTE — Assessment & Plan Note (Addendum)
#  Hereditary hemochromatosis, homozygous HD63 mutation.  Labs reviewed and discussed with patient. Lab Results  Component Value Date   HGB 10.6 (L) 08/05/2023   TIBC 277 08/05/2023   IRONPCTSAT 21 08/05/2023   FERRITIN 23 08/05/2023     No signs of iron over load, no need for phlebotomy

## 2023-08-08 ENCOUNTER — Telehealth: Payer: Self-pay | Admitting: Internal Medicine

## 2023-08-08 ENCOUNTER — Telehealth: Payer: Self-pay

## 2023-08-08 NOTE — Telephone Encounter (Signed)
Patient left VM requesting her INR results and to know what dose coumadin she should be on because she is currently out and needs to know what to get from pharmacy. Please advise.

## 2023-08-08 NOTE — Telephone Encounter (Signed)
-----   Message from Rickard Patience sent at 08/05/2023  8:35 PM EDT ----- Please let patient know that her iron level is within normal limits.  I recommend her to take B12 once daily empirically.Rx sent Ok to continue oral iron supplementation for now.  Recommend short term follow up 3 months lab 8-10 days prior to MD, labs are ordered. Thanks.

## 2023-08-08 NOTE — Telephone Encounter (Signed)
Spoke to pt and informed her of MD recommendation and follow up plan. Pt states she has iron supplement and will continue to take. She will pick up B12 and start taking.   Please schedule and inform pt of appt details:  3 months: lab 8-10 days prior to MD

## 2023-08-10 ENCOUNTER — Other Ambulatory Visit: Payer: Medicare PPO

## 2023-08-10 ENCOUNTER — Other Ambulatory Visit: Payer: Self-pay | Admitting: Internal Medicine

## 2023-08-10 DIAGNOSIS — I82531 Chronic embolism and thrombosis of right popliteal vein: Secondary | ICD-10-CM | POA: Diagnosis not present

## 2023-08-11 LAB — PROTIME-INR
INR: 4.5 — ABNORMAL HIGH (ref 0.9–1.2)
Prothrombin Time: 44.5 s — ABNORMAL HIGH (ref 9.1–12.0)

## 2023-08-11 NOTE — Progress Notes (Signed)
Patient notified

## 2023-08-15 ENCOUNTER — Other Ambulatory Visit: Payer: Self-pay | Admitting: Cardiovascular Disease

## 2023-08-15 ENCOUNTER — Other Ambulatory Visit: Payer: Self-pay | Admitting: Internal Medicine

## 2023-08-15 ENCOUNTER — Other Ambulatory Visit: Payer: Medicare PPO

## 2023-08-15 DIAGNOSIS — I82531 Chronic embolism and thrombosis of right popliteal vein: Secondary | ICD-10-CM | POA: Diagnosis not present

## 2023-08-16 LAB — PROTIME-INR
INR: 1.2 (ref 0.9–1.2)
Prothrombin Time: 13 s — ABNORMAL HIGH (ref 9.1–12.0)

## 2023-08-17 ENCOUNTER — Encounter: Payer: Self-pay | Admitting: Internal Medicine

## 2023-08-18 ENCOUNTER — Encounter: Payer: Self-pay | Admitting: Internal Medicine

## 2023-08-18 NOTE — Progress Notes (Signed)
Patient notified

## 2023-08-24 ENCOUNTER — Encounter: Payer: Self-pay | Admitting: Emergency Medicine

## 2023-08-24 ENCOUNTER — Emergency Department: Payer: Medicare PPO

## 2023-08-24 ENCOUNTER — Emergency Department
Admission: EM | Admit: 2023-08-24 | Discharge: 2023-08-24 | Disposition: A | Discharge status: Home / Self Care | Payer: Medicare PPO | Attending: Emergency Medicine | Admitting: Emergency Medicine

## 2023-08-24 ENCOUNTER — Telehealth: Payer: Self-pay | Admitting: Internal Medicine

## 2023-08-24 ENCOUNTER — Other Ambulatory Visit: Payer: Self-pay

## 2023-08-24 DIAGNOSIS — S0990XA Unspecified injury of head, initial encounter: Secondary | ICD-10-CM | POA: Diagnosis not present

## 2023-08-24 DIAGNOSIS — N189 Chronic kidney disease, unspecified: Secondary | ICD-10-CM | POA: Insufficient documentation

## 2023-08-24 DIAGNOSIS — M19071 Primary osteoarthritis, right ankle and foot: Secondary | ICD-10-CM | POA: Diagnosis not present

## 2023-08-24 DIAGNOSIS — Z4789 Encounter for other orthopedic aftercare: Secondary | ICD-10-CM | POA: Diagnosis not present

## 2023-08-24 DIAGNOSIS — Y92009 Unspecified place in unspecified non-institutional (private) residence as the place of occurrence of the external cause: Secondary | ICD-10-CM | POA: Diagnosis not present

## 2023-08-24 DIAGNOSIS — I129 Hypertensive chronic kidney disease with stage 1 through stage 4 chronic kidney disease, or unspecified chronic kidney disease: Secondary | ICD-10-CM | POA: Insufficient documentation

## 2023-08-24 DIAGNOSIS — M2012 Hallux valgus (acquired), left foot: Secondary | ICD-10-CM | POA: Diagnosis not present

## 2023-08-24 DIAGNOSIS — M25571 Pain in right ankle and joints of right foot: Secondary | ICD-10-CM | POA: Diagnosis not present

## 2023-08-24 DIAGNOSIS — S8012XA Contusion of left lower leg, initial encounter: Secondary | ICD-10-CM | POA: Diagnosis not present

## 2023-08-24 DIAGNOSIS — M79601 Pain in right arm: Secondary | ICD-10-CM | POA: Diagnosis not present

## 2023-08-24 DIAGNOSIS — Z043 Encounter for examination and observation following other accident: Secondary | ICD-10-CM | POA: Diagnosis not present

## 2023-08-24 DIAGNOSIS — M50323 Other cervical disc degeneration at C6-C7 level: Secondary | ICD-10-CM | POA: Diagnosis not present

## 2023-08-24 DIAGNOSIS — S199XXA Unspecified injury of neck, initial encounter: Secondary | ICD-10-CM | POA: Diagnosis not present

## 2023-08-24 DIAGNOSIS — M19072 Primary osteoarthritis, left ankle and foot: Secondary | ICD-10-CM | POA: Diagnosis not present

## 2023-08-24 DIAGNOSIS — Z471 Aftercare following joint replacement surgery: Secondary | ICD-10-CM | POA: Diagnosis not present

## 2023-08-24 DIAGNOSIS — S81812A Laceration without foreign body, left lower leg, initial encounter: Secondary | ICD-10-CM | POA: Diagnosis not present

## 2023-08-24 DIAGNOSIS — M79671 Pain in right foot: Secondary | ICD-10-CM | POA: Diagnosis not present

## 2023-08-24 DIAGNOSIS — S92322A Displaced fracture of second metatarsal bone, left foot, initial encounter for closed fracture: Secondary | ICD-10-CM | POA: Diagnosis not present

## 2023-08-24 DIAGNOSIS — M25511 Pain in right shoulder: Secondary | ICD-10-CM | POA: Insufficient documentation

## 2023-08-24 DIAGNOSIS — M79604 Pain in right leg: Secondary | ICD-10-CM | POA: Diagnosis not present

## 2023-08-24 DIAGNOSIS — R55 Syncope and collapse: Secondary | ICD-10-CM | POA: Diagnosis not present

## 2023-08-24 DIAGNOSIS — W06XXXA Fall from bed, initial encounter: Secondary | ICD-10-CM | POA: Diagnosis not present

## 2023-08-24 DIAGNOSIS — Z96642 Presence of left artificial hip joint: Secondary | ICD-10-CM | POA: Diagnosis not present

## 2023-08-24 DIAGNOSIS — M47811 Spondylosis without myelopathy or radiculopathy, occipito-atlanto-axial region: Secondary | ICD-10-CM | POA: Diagnosis not present

## 2023-08-24 DIAGNOSIS — M1612 Unilateral primary osteoarthritis, left hip: Secondary | ICD-10-CM | POA: Diagnosis not present

## 2023-08-24 DIAGNOSIS — T07XXXA Unspecified multiple injuries, initial encounter: Secondary | ICD-10-CM

## 2023-08-24 DIAGNOSIS — M25521 Pain in right elbow: Secondary | ICD-10-CM | POA: Diagnosis not present

## 2023-08-24 DIAGNOSIS — M47812 Spondylosis without myelopathy or radiculopathy, cervical region: Secondary | ICD-10-CM | POA: Diagnosis not present

## 2023-08-24 DIAGNOSIS — W19XXXA Unspecified fall, initial encounter: Secondary | ICD-10-CM

## 2023-08-24 DIAGNOSIS — M19011 Primary osteoarthritis, right shoulder: Secondary | ICD-10-CM | POA: Diagnosis not present

## 2023-08-24 LAB — CBC WITH DIFFERENTIAL/PLATELET
Abs Immature Granulocytes: 0.02 10*3/uL (ref 0.00–0.07)
Basophils Absolute: 0 10*3/uL (ref 0.0–0.1)
Basophils Relative: 1 %
Eosinophils Absolute: 0.2 10*3/uL (ref 0.0–0.5)
Eosinophils Relative: 4 %
HCT: 33.8 % — ABNORMAL LOW (ref 36.0–46.0)
Hemoglobin: 11.1 g/dL — ABNORMAL LOW (ref 12.0–15.0)
Immature Granulocytes: 0 %
Lymphocytes Relative: 23 %
Lymphs Abs: 1.2 10*3/uL (ref 0.7–4.0)
MCH: 29.8 pg (ref 26.0–34.0)
MCHC: 32.8 g/dL (ref 30.0–36.0)
MCV: 90.6 fL (ref 80.0–100.0)
Monocytes Absolute: 0.4 10*3/uL (ref 0.1–1.0)
Monocytes Relative: 7 %
Neutro Abs: 3.5 10*3/uL (ref 1.7–7.7)
Neutrophils Relative %: 65 %
Platelets: 201 10*3/uL (ref 150–400)
RBC: 3.73 MIL/uL — ABNORMAL LOW (ref 3.87–5.11)
RDW: 12.7 % (ref 11.5–15.5)
WBC: 5.4 10*3/uL (ref 4.0–10.5)
nRBC: 0 % (ref 0.0–0.2)

## 2023-08-24 LAB — URINALYSIS, ROUTINE W REFLEX MICROSCOPIC
Bilirubin Urine: NEGATIVE
Glucose, UA: NEGATIVE mg/dL
Hgb urine dipstick: NEGATIVE
Ketones, ur: NEGATIVE mg/dL
Nitrite: NEGATIVE
Protein, ur: NEGATIVE mg/dL
RBC / HPF: 0 RBC/hpf (ref 0–5)
Specific Gravity, Urine: 1.004 — ABNORMAL LOW (ref 1.005–1.030)
pH: 8 (ref 5.0–8.0)

## 2023-08-24 LAB — COMPREHENSIVE METABOLIC PANEL
ALT: 12 U/L (ref 0–44)
AST: 19 U/L (ref 15–41)
Albumin: 3.7 g/dL (ref 3.5–5.0)
Alkaline Phosphatase: 62 U/L (ref 38–126)
Anion gap: 6 (ref 5–15)
BUN: 14 mg/dL (ref 8–23)
CO2: 26 mmol/L (ref 22–32)
Calcium: 9.4 mg/dL (ref 8.9–10.3)
Chloride: 109 mmol/L (ref 98–111)
Creatinine, Ser: 0.8 mg/dL (ref 0.44–1.00)
GFR, Estimated: >60 mL/min (ref >60)
Glucose, Bld: 98 mg/dL (ref 70–99)
Potassium: 3.7 mmol/L (ref 3.5–5.1)
Sodium: 141 mmol/L (ref 135–145)
Total Bilirubin: 0.7 mg/dL (ref <1.2)
Total Protein: 6.5 g/dL (ref 6.5–8.1)

## 2023-08-24 LAB — TROPONIN I (HIGH SENSITIVITY)
Troponin I (High Sensitivity): 7 ng/L (ref <18)
Troponin I (High Sensitivity): 7 ng/L (ref <18)

## 2023-08-24 LAB — PROTIME-INR
INR: 2.6 — ABNORMAL HIGH (ref 0.8–1.2)
Prothrombin Time: 28 s — ABNORMAL HIGH (ref 11.4–15.2)

## 2023-08-24 MED ORDER — DIAZEPAM 5 MG/ML IJ SOLN: 2.5000 mg | Freq: Once | INTRAMUSCULAR | Status: DC

## 2023-08-24 MED ORDER — LIDOCAINE-EPINEPHRINE-TETRACAINE (LET) TOPICAL GEL
6.0000 mL | Freq: Once | TOPICAL | Status: AC
  Administered 2023-08-24: 6 mL via TOPICAL
  Filled 2023-08-24: qty 6

## 2023-08-24 NOTE — Discharge Instructions (Addendum)
Your INR was 2.6 today please let your physician know so you do not have to have it rechecked tomorrow Lab work is reassuring CTs and x-rays are reassuring Keep the wound on your left lower leg dry.  Do not remove the Steri-Strips.  Follow-up with your doctor in 2 days for a wound recheck

## 2023-08-24 NOTE — ED Provider Notes (Signed)
[facility] Provider Note    Event Date/Time   First MD Initiated Contact with Patient 08/24/23 [phone]     (approximate)   History   Fall   HPI  Sarah Riddle is a 82 y.o. female with history of A-fib, hypertension, osteopenia, neuropathy, OSA, CKD presents emergency department after a syncopal episode this morning.  Patient states that she stood up from the bed very slowly does not member what happened and woke on the floor.  States landed on the right arm/shoulder, left leg has large laceration, no headache, however patient is on Coumadin.  States when she goes to sit up now she still feels a little woozy.  No vomiting.  No chest pain or shortness of breath      Physical Exam   Triage Vital Signs: ED Triage Vitals [08/24/23 0829]  Encounter Vitals Group     BP (!) 110/93     Systolic BP Percentile      Diastolic BP Percentile      Pulse Rate (!) 58     Resp 18     Temp 98.6 F (37 C)     Temp Source Oral     SpO2 99 %     Weight 188 lb (85.3 kg)     Height 5\' 11"  (1.803 m)     Head Circumference      Peak Flow      Pain Score 4     Pain Loc      Pain Education      Exclude from Growth Chart     Most recent vital signs: Vitals:   08/24/23 1300 08/24/23 1305  BP: (!) 164/66   Pulse: 72 65  Resp: (!) 21 17  Temp:    SpO2: 95% 95%     General: Awake, no distress.   CV:  Good peripheral perfusion. regular rate and  rhythm Resp:  Normal effort. Lungs cta Abd:  No distention.   Other:  Right shoulder and right elbow are tender to palpation, right tib-fib and right ankle are tender, left tib-fib and left ankle are tender, laceration noted on the anterior of the left tib-fib, neurovascular intact, cranial nerves II through XII appear to be intact   ED Results / Procedures / Treatments   Labs (all labs ordered are listed, but only abnormal results are displayed) Labs Reviewed  CBC WITH DIFFERENTIAL/PLATELET - Abnormal; Notable for  the following components:      Result Value   RBC 3.73 (*)    Hemoglobin 11.1 (*)    HCT 33.8 (*)    All other components within normal limits  URINALYSIS, ROUTINE W REFLEX MICROSCOPIC - Abnormal; Notable for the following components:   Color, Urine STRAW (*)    APPearance CLEAR (*)    Specific Gravity, Urine 1.004 (*)    Leukocytes,Ua TRACE (*)    Bacteria, UA RARE (*)    All other components within normal limits  PROTIME-INR - Abnormal; Notable for the following components:   Prothrombin Time 28.0 (*)    INR 2.6 (*)    All other components within normal limits  COMPREHENSIVE METABOLIC PANEL  TROPONIN I (HIGH SENSITIVITY)  TROPONIN I (HIGH SENSITIVITY)     EKG  EKG   RADIOLOGY CT of the head, C-spine X-ray of the right shoulder, right elbow, right tib-fib, right foot, left tib-fib, left foot, x-ray of the left hip    PROCEDURES:   .Marland KitchenLaceration Repair  Date/Time: 08/24/2023  2:48 PM  Performed by: Faythe Ghee, PA-C Authorized by: Faythe Ghee, PA-C   Consent:    Consent obtained:  Verbal   Consent given by:  Patient   Risks, benefits, and alternatives were discussed: yes     Risks discussed:  Infection, pain, retained foreign body, need for additional repair, poor cosmetic result, tendon damage, vascular damage, poor wound healing and nerve damage   Alternatives discussed:  No treatment Universal protocol:    Procedure explained and questions answered to patient or proxy's satisfaction: yes     Immediately prior to procedure, a time out was called: yes     Patient identity confirmed:  Verbally with patient Anesthesia:    Anesthesia method:  Topical application   Topical anesthetic:  LET Laceration details:    Location:  Leg   Leg location:  L lower leg   Length (cm):  10 Pre-procedure details:    Preparation:  Imaging obtained to evaluate for foreign bodies Exploration:    Hemostasis achieved with:  Direct pressure   Imaging outcome: foreign body  not noted     Wound exploration: wound explored through full range of motion     Wound extent: areolar tissue not violated, fascia not violated, no foreign body, no signs of injury, no nerve damage, no tendon damage, no underlying fracture and no vascular damage     Contaminated: no   Treatment:    Area cleansed with:  Saline   Amount of cleaning:  Standard   Irrigation solution:  Sterile saline   Irrigation method:  Tap Skin repair:    Repair method:  Steri-Strips and tissue adhesive   Number of Steri-Strips:  10 Approximation:    Approximation:  Close Repair type:    Repair type:  Simple Post-procedure details:    Dressing:  Open (no dressing)   Procedure completion:  Tolerated well, no immediate complications Comments:     Very large skin tear    MEDICATIONS ORDERED IN ED: Medications  lidocaine-EPINEPHrine-tetracaine (LET) topical gel (6 mLs Topical Given 08/24/23 1104)     IMPRESSION / MDM / ASSESSMENT AND PLAN / ED COURSE  I reviewed the triage vital signs and the nursing notes.                              Differential diagnosis includes, but is not limited to, syncope, CVA, SAH, subdural, MI, arrhythmia, mechanical fall, fracture, contusion, sprain, laceration,  Patient's presentation is most consistent with acute presentation with potential threat to life or bodily function.   Due to the syncopal episode we will go ahead with lab work and imaging EKG ordered, due to her history of A-fib and syncope we will place her on cardiac monitor   EKG shows normal sinus rhythm, see physician read  CT of the head cervical spine, independently reviewed interpreted by me as being negative for any acute abnormality  X-ray of the right foot, right tib-fib, left foot, left tib-fib, left hip, right elbow and right shoulder independently reviewed interpreted by me as being negative for any acute abnormality, confirmed by radiology  Patient's lab work is reassuring, first and  second troponin are normal, did perform a pro time for the patient as she was supposed to have it checked tomorrow.  Level is 2.6.  She is to call her regular doctor notified of this level.  Due to the large area of skin that is involved in the  skin tear would like for her to have a recheck at her doctor's office in 2 days.  At the latest on Monday.  She is in agreement with treatment plan.  I did discuss all lab findings, imaging, and EKG results with her and her daughter.  Patient had some mild tremors while here in the ED.  She never lost consciousness.  Resolved on its own.  She should follow-up with her regular doctor concerning this if it happens again.  She is in agreement.  Discharged stable condition.   FINAL CLINICAL IMPRESSION(S) / ED DIAGNOSES   Final diagnoses:  Fall, initial encounter  Laceration of left lower extremity, initial encounter  Multiple contusions  Syncope, unspecified syncope type     Rx / DC Orders   ED Discharge Orders     None        Note:  This document was prepared using Dragon voice recognition software and may include unintentional dictation errors.    Faythe Ghee, PA-C 08/24/23 1452    Phineas Semen, MD 08/24/23 [phone]

## 2023-08-24 NOTE — ED Notes (Signed)
Pt in rad 

## 2023-08-24 NOTE — ED Triage Notes (Signed)
Patient to ED via POV after a fall. States she was leaning over the bed and lost her balance. Laceration to left lower leg and pain in right arm. Denies hitting her head or LOC. Does take blood thinners. Ambulatory with steady gait.

## 2023-08-24 NOTE — Telephone Encounter (Signed)
Patient called in stating she had a fall this morning and had to go to the ED and they checked her INR at the hospital and it was 2.6. Please advise on how she should be taking her warfarin.

## 2023-08-26 ENCOUNTER — Ambulatory Visit: Payer: Medicare PPO | Admitting: Internal Medicine

## 2023-08-26 ENCOUNTER — Encounter: Payer: Self-pay | Admitting: Internal Medicine

## 2023-08-26 VITALS — BP 130/70 | HR 67 | Ht 71.0 in | Wt 188.8 lb

## 2023-08-26 DIAGNOSIS — S81812D Laceration without foreign body, left lower leg, subsequent encounter: Secondary | ICD-10-CM

## 2023-08-26 DIAGNOSIS — E782 Mixed hyperlipidemia: Secondary | ICD-10-CM

## 2023-08-26 DIAGNOSIS — K219 Gastro-esophageal reflux disease without esophagitis: Secondary | ICD-10-CM | POA: Diagnosis not present

## 2023-08-26 DIAGNOSIS — L089 Local infection of the skin and subcutaneous tissue, unspecified: Secondary | ICD-10-CM | POA: Diagnosis not present

## 2023-08-26 DIAGNOSIS — T148XXA Other injury of unspecified body region, initial encounter: Secondary | ICD-10-CM | POA: Diagnosis not present

## 2023-08-26 DIAGNOSIS — I10 Essential (primary) hypertension: Secondary | ICD-10-CM | POA: Diagnosis not present

## 2023-08-26 MED ORDER — AMOXICILLIN-POT CLAVULANATE 500-125 MG PO TABS
1.0000 | ORAL_TABLET | Freq: Two times a day (BID) | ORAL | 0 refills | Status: DC
Start: 2023-08-26 — End: 2023-09-02

## 2023-08-26 NOTE — Progress Notes (Signed)
Established Patient Office Visit  Subjective:  Patient ID: Sarah Riddle, female    DOB: [birth year]  Age: 82 y.o. MRN: [id number]  Chief Complaint  Patient presents with   Follow-up    Hospital follow up    Patient comes in for follow-up of her ED visit from 08/24/2023.  She went there with history of a fall, and injury to her left anterior lower leg. She is not sure as to how she fell, but does not recall passing out or losing consciousness, no dizziness, no chest pain, no palpitations, and no shortness of breath prior to the fall.  She got out some noise as she was falling and her daughter came to the room.  They noticed multiple contusions with significant skin tear from the anterior left lower leg.  In the ED her CT scan he had as well as multiple x-rays were all unremarkable.  Patient is on p.o. Coumadin and her most recent PT/INR was therapeutic.  Steri-Strips were applied to the skin wound. Today patient reports that the wound area is significantly bruising and there is some redness and tenderness around it. Will send a referral to wound clinic for evaluation.  Also start p.o. Augmentin. No complaints of fevers or chills, no dizziness and no palpitations.    No other concerns at this time.   Past Medical History:  Diagnosis Date   A-fib [facility])    Accelerated hypertension 10/31/2022   Age related osteoporosis    Cancer (HCC)    Melanoma x 2 -- Lt Leg -1976 Rt Arm - 2014   Chronic bilateral low back pain    Chronic kidney disease    Complication of anesthesia    DVT (deep venous thrombosis) (HCC) 1960   right leg  was on BC pills   Dysrhythmia    Family history of breast cancer    Family history of colon cancer    Family history of melanoma    Family history of prostate cancer    Fibrocystic breast disease    GERD (gastroesophageal reflux disease)    Headache    migraine   Heart murmur    not heard now.   History of kidney stones    Hyperlipidemia     Hypertension    IBS (irritable bowel syndrome)    Lumbago    Mitral valve prolapse    Neuropathy    Obstructive sleep apnea    Osteoarthritis of shoulder    PONV (postoperative nausea and vomiting)     Past Surgical History:  Procedure Laterality Date   ABDOMINAL HYSTERECTOMY     BACK SURGERY     BREAST BIOPSY Left 04/20/2023   [place] bx/ heart clip/ path pending   BREAST BIOPSY Left 04/20/2023   [place] LT BREAST BX W LOC DEV 1ST LESION IMG BX SPEC US GUIDE 04/20/2023 [facility]   BREAST CYST ASPIRATION Left 2001   Negative   CARDIAC ELECTROPHYSIOLOGY MAPPING AND ABLATION     EYE SURGERY     HUMERUS IM NAIL Right 05/05/2022   Procedure: INTRAMEDULLARY (IM) NAIL HUMERAL;  Surgeon: Roby Lofts, MD;  Location: [facility] OR;  Service: Orthopedics;  Laterality: Right;   HYSTEROTOMY     L4-L5  2020   titaum rebok cage   LEFT HEART CATH AND CORONARY ANGIOGRAPHY N/A 11/01/2022   Procedure: LEFT HEART CATH AND CORONARY ANGIOGRAPHY and possible PCI and stent;  Surgeon: Laurier Nancy, MD;  Location: [facility] INVASIVE CV LAB;  Service: Cardiovascular;  Laterality: N/A;   TOTAL HIP ARTHROPLASTY Left     Social History   Socioeconomic History   Marital status: Married    Spouse name: Not on file   Number of children: Not on file   Years of education: Not on file   Highest education level: Not on file  Occupational History   Not on file  Tobacco Use   Smoking status: Never   Smokeless tobacco: Never  Vaping Use   Vaping status: Never Used  Substance and Sexual Activity   Alcohol use: No   Drug use: No   Sexual activity: Not on file  Other Topics Concern   Not on file  Social History Narrative   Not on file   Social Determinants of Health   Financial Resource Strain: Not on file  Food Insecurity: No Food Insecurity (10/31/2022)   Hunger Vital Sign    Worried About Running Out of Food in the Last Year: Never true    Ran Out of Food in the Last Year: Never true  Transportation  Needs: No Transportation Needs (10/31/2022)   PRAPARE - Administrator, Civil Service (Medical): No    Lack of Transportation (Non-Medical): No  Physical Activity: Not on file  Stress: Not on file  Social Connections: Not on file  Intimate Partner Violence: Not At Risk (10/31/2022)   Humiliation, Afraid, Rape, and Kick questionnaire    Fear of Current or Ex-Partner: No    Emotionally Abused: No    Physically Abused: No    Sexually Abused: No    Family History  Problem Relation Age of Onset   Colon cancer Mother 40   Prostate cancer Father        dx 37s   Cancer Sister    Acute myelogenous leukemia Sister        dx 36s   Prostate cancer Brother    Melanoma Brother    Prostate cancer Brother    Melanoma Brother    HIV/AIDS Brother 34   Tuberculosis Paternal Aunt    Melanoma Other    Breast cancer Other    Melanoma Niece     Allergies  Allergen Reactions   Iodinated Contrast Media Shortness Of Breath and Other (See Comments)    Can be premedicated  Have been able to tolerate if given premeds (benadryl, prednisone)   Ibuprofen Other (See Comments)    "stomach problems"    Lubiprostone Nausea And Vomiting        Sulfa Antibiotics Other (See Comments)    "Stomach problems"  "Stomach problems"  "Stomach problems"  "Stomach problems"   Zolpidem Other (See Comments)    "hallucinations"  Other reaction(s): Confusion  Other Reaction(s): confusion   Boniva [Ibandronic Acid] Diarrhea and Nausea And Vomiting   Celecoxib Other (See Comments)    GI upset    Doxycycline Diarrhea and Nausea And Vomiting   Duloxetine Other (See Comments)    Over sedation with Zelnorm (IBS med)   Tetracyclines & Related Nausea Only and Nausea And Vomiting    Outpatient Medications Prior to Visit  Medication Sig   acetaminophen (TYLENOL) 500 MG tablet Take 500 mg by mouth every 6 (six) hours as needed. Once a day   alendronate (FOSAMAX) 70 MG tablet Take 1 tablet (70 mg  total) by mouth every Thursday. Take with a full glass of water on an empty stomach.   allopurinol (ZYLOPRIM) 100 MG tablet Take 100 mg by mouth daily.   azelastine (ASTELIN)  0.1 % nasal spray Place 2 sprays into both nostrils at bedtime. Use in each nostril as directed   Cranberry 125 MG TABS Take 125 mg by mouth daily.   cyanocobalamin (VITAMIN B12) 1000 MCG tablet Take 1,000 mcg by mouth daily. Monday to Friday   EPINEPHrine 0.3 mg/0.3 mL IJ SOAJ injection Inject 0.3 mg into the muscle as needed.   fluticasone (FLONASE) 50 MCG/ACT nasal spray Place 2 sprays into both nostrils daily.   gabapentin (NEURONTIN) 800 MG tablet TAKE 1 TABLET FOUR TIMES DAILY   hydrALAZINE (APRESOLINE) 25 MG tablet TAKE 1 TABLET TWICE DAILY   isosorbide mononitrate (IMDUR) 60 MG 24 hr tablet TAKE 1 TABLET EVERY DAY   levocetirizine (XYZAL) 5 MG tablet Take 1 tablet (5 mg total) by mouth every evening.   losartan (COZAAR) 25 MG tablet Take 25 mg by mouth daily.   meclizine (ANTIVERT) 12.5 MG tablet Take 1 tablet (12.5 mg total) by mouth 3 (three) times daily as needed for dizziness.   metoprolol succinate (TOPROL-XL) 25 MG 24 hr tablet TAKE 1 TABLET EVERY DAY   nitroGLYCERIN (NITROSTAT) 0.4 MG SL tablet Place 0.4 mg under the tongue every 5 (five) minutes as needed for chest pain.   pantoprazole (PROTONIX) 40 MG tablet TAKE 1 TABLET EVERY DAY   rosuvastatin (CRESTOR) 40 MG tablet TAKE 1 TABLET EVERY DAY   sertraline (ZOLOFT) 25 MG tablet Take 25 mg by mouth 3 (three) times daily. 2 tabs in AM 1 HS   spironolactone (ALDACTONE) 25 MG tablet Take 1 tablet (25 mg total) by mouth daily.   Vitamin D, Ergocalciferol, 50000 units CAPS TAKE 1 CAPSULE BY MOUTH ONCE WEEKLY.   warfarin (COUMADIN) 3 MG tablet Take 1 tablet (3 mg total) by mouth daily.   Polyethyl Glyc-Propyl Glyc PF (SYSTANE HYDRATION PF) 0.4-0.3 % SOLN Apply to eye.   [DISCONTINUED] ezetimibe (ZETIA) 10 MG tablet TAKE 1 TABLET EVERY DAY (Patient not taking:  Reported on 07/14/2023)   [DISCONTINUED] tetrahydrozoline-zinc (VISINE-AC) 0.05-0.25 % ophthalmic solution Place 2 drops into both eyes 3 (three) times daily as needed (dry eyes). (Patient not taking: Reported on 07/14/2023)   No facility-administered medications prior to visit.    Review of Systems  Constitutional: Negative.  Negative for diaphoresis, fever, malaise/fatigue and weight loss.  HENT: Negative.  Negative for nosebleeds and sinus pain.   Eyes: Negative.  Negative for blurred vision and double vision.  Respiratory: Negative.  Negative for cough and shortness of breath.   Cardiovascular: Negative.  Negative for chest pain, palpitations and leg swelling.  Gastrointestinal: Negative.  Negative for abdominal pain, blood in stool, constipation, diarrhea, heartburn, nausea and vomiting.  Genitourinary: Negative.  Negative for dysuria and flank pain.  Musculoskeletal: Negative.  Negative for joint pain and myalgias.  Skin: Negative.   Neurological: Negative.  Negative for dizziness, tingling, tremors and headaches.  Endo/Heme/Allergies: Negative.   Psychiatric/Behavioral: Negative.  Negative for depression and suicidal ideas. The patient is not nervous/anxious.        Objective:   BP 130/70   Pulse 67   Ht 5\' 11"  (1.803 m)   Wt 188 lb 12.8 oz (85.6 kg)   SpO2 95%   BMI 26.33 kg/m   Vitals:   08/26/23 1043  BP: 130/70  Pulse: 67  Height: 5\' 11"  (1.803 m)  Weight: 188 lb 12.8 oz (85.6 kg)  SpO2: 95%  BMI (Calculated): 26.34    Physical Exam Vitals and nursing note reviewed.  Constitutional:  Appearance: Normal appearance.  HENT:     Head: Normocephalic and atraumatic.     Nose: Nose normal.     Mouth/Throat:     Mouth: Mucous membranes are moist.     Pharynx: Oropharynx is clear.  Eyes:     Conjunctiva/sclera: Conjunctivae normal.     Pupils: Pupils are equal, round, and reactive to light.  Cardiovascular:     Rate and Rhythm: Normal rate and regular  rhythm.     Pulses: Normal pulses.     Heart sounds: Normal heart sounds. No murmur heard. Pulmonary:     Effort: Pulmonary effort is normal.     Breath sounds: Normal breath sounds. No wheezing.  Abdominal:     General: Bowel sounds are normal.     Palpations: Abdomen is soft.     Tenderness: There is no abdominal tenderness. There is no right CVA tenderness or left CVA tenderness.  Musculoskeletal:        General: Normal range of motion.     Cervical back: Normal range of motion.     Right lower leg: No edema.     Left lower leg: No edema.  Skin:    General: Skin is warm and dry.  Neurological:     General: No focal deficit present.     Mental Status: She is alert and oriented to person, place, and time.  Psychiatric:        Mood and Affect: Mood normal.        Behavior: Behavior normal.      No results found for any visits on 08/26/23.  Recent Results (from the past 2160 hour(s))  Protime-INR     Status: Abnormal   Collection Time: 06/24/23  1:47 PM  Result Value Ref Range   INR 3.9 (H) 0.9 - 1.2    Comment: Reference interval is for non-anticoagulated patients. Suggested INR therapeutic range for Vitamin K antagonist therapy:    Standard Dose (moderate intensity                   therapeutic range):       2.0 - 3.0    Higher intensity therapeutic range       2.5 - 3.5    Prothrombin Time 39.2 (H) 9.1 - 12.0 sec  Protime-INR     Status: Abnormal   Collection Time: 07/04/23  9:41 AM  Result Value Ref Range   INR 3.2 (H) 0.9 - 1.2    Comment: Reference interval is for non-anticoagulated patients. Suggested INR therapeutic range for Vitamin K antagonist therapy:    Standard Dose (moderate intensity                   therapeutic range):       2.0 - 3.0    Higher intensity therapeutic range       2.5 - 3.5    Prothrombin Time 32.4 (H) 9.1 - 12.0 sec  Hemoglobin A1c     Status: None   Collection Time: 07/12/23  8:57 AM  Result Value Ref Range   Hgb A1c MFr Bld  5.6 4.8 - 5.6 %    Comment:          Prediabetes: 5.7 - 6.4          Diabetes: >6.4          Glycemic control for adults with diabetes: <7.0    Est. average glucose Bld gHb Est-mCnc 114 mg/dL  TSH  Status: None   Collection Time: 07/12/23  8:57 AM  Result Value Ref Range   TSH 1.010 0.450 - 4.500 uIU/mL  CMP14+EGFR     Status: Abnormal   Collection Time: 07/12/23  8:57 AM  Result Value Ref Range   Glucose 89 70 - 99 mg/dL   BUN 19 8 - 27 mg/dL   Creatinine, Ser 0.86 0.57 - 1.00 mg/dL   eGFR 63 >57 QI/ONG/2.95   BUN/Creatinine Ratio 21 12 - 28   Sodium 144 134 - 144 mmol/L   Potassium 5.4 (H) 3.5 - 5.2 mmol/L   Chloride 104 96 - 106 mmol/L   CO2 24 20 - 29 mmol/L   Calcium 9.6 8.7 - 10.3 mg/dL   Total Protein 5.7 (L) 6.0 - 8.5 g/dL   Albumin 3.9 3.7 - 4.7 g/dL   Globulin, Total 1.8 1.5 - 4.5 g/dL   Bilirubin Total 0.3 0.0 - 1.2 mg/dL   Alkaline Phosphatase 65 44 - 121 IU/L   AST 14 0 - 40 IU/L   ALT 8 0 - 32 IU/L  Lipid panel     Status: None   Collection Time: 07/12/23  8:57 AM  Result Value Ref Range   Cholesterol, Total 148 100 - 199 mg/dL   Triglycerides 284 0 - 149 mg/dL   HDL 64 >13 mg/dL   VLDL Cholesterol Cal 19 5 - 40 mg/dL   LDL Chol Calc (NIH) 65 0 - 99 mg/dL   Chol/HDL Ratio 2.3 0.0 - 4.4 ratio    Comment:                                   T. Chol/HDL Ratio                                             Men  Women                               1/2 Avg.Risk  3.4    3.3                                   Avg.Risk  5.0    4.4                                2X Avg.Risk  9.6    7.1                                3X Avg.Risk 23.4   11.0   Protime-INR     Status: Abnormal   Collection Time: 07/27/23 10:00 AM  Result Value Ref Range   INR 4.8 (H) 0.9 - 1.2    Comment: Reference interval is for non-anticoagulated patients. Suggested INR therapeutic range for Vitamin K antagonist therapy:    Standard Dose (moderate intensity                   therapeutic range):        2.0 - 3.0    Higher intensity therapeutic range  2.5 - 3.5    Prothrombin Time 47.5 (H) 9.1 - 12.0 sec  Iron and TIBC     Status: None   Collection Time: 08/05/23 11:37 AM  Result Value Ref Range   Iron 58 28 - 170 ug/dL   TIBC 409 811 - 914 ug/dL   Saturation Ratios 21 10.4 - 31.8 %   UIBC 219 ug/dL    Comment: Performed at [facility], [address] [place], [place] [address]  Ferritin     Status: None   Collection Time: 08/05/23 11:37 AM  Result Value Ref Range   Ferritin 23 11 - 307 ng/mL    Comment: Performed at [facility], [address] [place], [place] [address]  Hepatic function panel     Status: Abnormal   Collection Time: 08/05/23 11:37 AM  Result Value Ref Range   Total Protein 6.2 (L) 6.5 - 8.1 g/dL   Albumin 3.7 3.5 - 5.0 g/dL   AST 15 15 - 41 U/L   ALT 10 0 - 44 U/L   Alkaline Phosphatase 58 38 - 126 U/L   Total Bilirubin 1.0 0.3 - 1.2 mg/dL   Bilirubin, Direct 0.2 0.0 - 0.2 mg/dL   Indirect Bilirubin 0.8 0.3 - 0.9 mg/dL    Comment: Performed at [facility], [address] [place], [place] [address]  CBC with Differential/Platelet     Status: Abnormal   Collection Time: 08/05/23 11:37 AM  Result Value Ref Range   WBC 5.8 4.0 - 10.5 K/uL   RBC 3.54 (L) 3.87 - 5.11 MIL/uL   Hemoglobin 10.6 (L) 12.0 - 15.0 g/dL   HCT 46.9 (L) 62.9 - 52.8 %   MCV 93.5 80.0 - 100.0 fL   MCH 29.9 26.0 - 34.0 pg   MCHC 32.0 30.0 - 36.0 g/dL   RDW 41.3 24.4 - 01.0 %   Platelets 196 150 - 400 K/uL   nRBC 0.0 0.0 - 0.2 %   Neutrophils Relative % 63 %   Neutro Abs 3.6 1.7 - 7.7 K/uL   Lymphocytes Relative 24 %   Lymphs Abs 1.4 0.7 - 4.0 K/uL   Monocytes Relative 7 %   Monocytes Absolute 0.4 0.1 - 1.0 K/uL   Eosinophils Relative 5 %   Eosinophils Absolute 0.3 0.0 - 0.5 K/uL   Basophils Relative 1 %   Basophils Absolute 0.0 0.0 - 0.1 K/uL   Immature Granulocytes 0 %   Abs Immature Granulocytes 0.02 0.00 - 0.07 K/uL    Comment:  Performed at [facility], [address] [place], [place] [address]  Folate     Status: None   Collection Time: 08/05/23 11:37 AM  Result Value Ref Range   Folate 22.7 >5.9 ng/mL    Comment: Performed at [facility], [address] [place], [place] [address]  Basic Metabolic Panel - Cancer Center Only     Status: None   Collection Time: 08/05/23 11:37 AM  Result Value Ref Range   Sodium 140 135 - 145 mmol/L   Potassium 3.6 3.5 - 5.1 mmol/L   Chloride 106 98 - 111 mmol/L   CO2 28 22 - 32 mmol/L   Glucose, Bld 93 70 - 99 mg/dL    Comment: Glucose reference range applies only to samples taken after fasting for at least 8 hours.   BUN 13 8 - 23 mg/dL   Creatinine 3.47 4.25 - 1.00 mg/dL   Calcium 9.1 8.9 - 95.6 mg/dL  GFR, Estimated >60 >60 mL/min    Comment: (NOTE) Calculated using the CKD-EPI Creatinine Equation (2021)    Anion gap 6 5 - 15    Comment: Performed at [facility], [address] [place], [place] [address]  Protime-INR     Status: Abnormal   Collection Time: 08/10/23  8:37 AM  Result Value Ref Range   INR 4.5 (H) 0.9 - 1.2    Comment: Reference interval is for non-anticoagulated patients. Suggested INR therapeutic range for Vitamin K antagonist therapy:    Standard Dose (moderate intensity                   therapeutic range):       2.0 - 3.0    Higher intensity therapeutic range       2.5 - 3.5    Prothrombin Time 44.5 (H) 9.1 - 12.0 sec  Protime-INR     Status: Abnormal   Collection Time: 08/15/23  8:58 AM  Result Value Ref Range   INR 1.2 0.9 - 1.2    Comment: Reference interval is for non-anticoagulated patients. Suggested INR therapeutic range for Vitamin K antagonist therapy:    Standard Dose (moderate intensity                   therapeutic range):       2.0 - 3.0    Higher intensity therapeutic range       2.5 - 3.5    Prothrombin Time 13.0 (H) 9.1 - 12.0 sec  Comprehensive metabolic panel     Status: None   Collection  Time: 08/24/23 10:25 AM  Result Value Ref Range   Sodium 141 135 - 145 mmol/L   Potassium 3.7 3.5 - 5.1 mmol/L   Chloride 109 98 - 111 mmol/L   CO2 26 22 - 32 mmol/L   Glucose, Bld 98 70 - 99 mg/dL    Comment: Glucose reference range applies only to samples taken after fasting for at least 8 hours.   BUN 14 8 - 23 mg/dL   Creatinine, Ser 5.78 0.44 - 1.00 mg/dL   Calcium 9.4 8.9 - 46.9 mg/dL   Total Protein 6.5 6.5 - 8.1 g/dL   Albumin 3.7 3.5 - 5.0 g/dL   AST 19 15 - 41 U/L   ALT 12 0 - 44 U/L   Alkaline Phosphatase 62 38 - 126 U/L   Total Bilirubin 0.7 <1.2 mg/dL   GFR, Estimated >62 >95 mL/min    Comment: (NOTE) Calculated using the CKD-EPI Creatinine Equation (2021)    Anion gap 6 5 - 15    Comment: Performed at [facility], [address], [place], [place] [address]  Troponin I (High Sensitivity)     Status: None   Collection Time: 08/24/23 10:25 AM  Result Value Ref Range   Troponin I (High Sensitivity) 7 <18 ng/L    Comment: (NOTE) Elevated high sensitivity troponin I (hsTnI) values and significant  changes across serial measurements may suggest ACS but many other  chronic and acute conditions are known to elevate hsTnI results.  Refer to the "Links" section for chest pain algorithms and additional  guidance. Performed at [facility], [address] [place], [place] [address]   CBC with Differential     Status: Abnormal   Collection Time: 08/24/23 10:25 AM  Result Value Ref Range   WBC 5.4 4.0 - 10.5 K/uL   RBC 3.73 (L) 3.87 - 5.11 MIL/uL  Hemoglobin 11.1 (L) 12.0 - 15.0 g/dL   HCT 16.1 (L) 09.6 - 04.5 %   MCV 90.6 80.0 - 100.0 fL   MCH 29.8 26.0 - 34.0 pg   MCHC 32.8 30.0 - 36.0 g/dL   RDW 40.9 81.1 - 91.4 %   Platelets 201 150 - 400 K/uL   nRBC 0.0 0.0 - 0.2 %   Neutrophils Relative % 65 %   Neutro Abs 3.5 1.7 - 7.7 K/uL   Lymphocytes Relative 23 %   Lymphs Abs 1.2 0.7 - 4.0 K/uL   Monocytes Relative 7 %   Monocytes Absolute 0.4  0.1 - 1.0 K/uL   Eosinophils Relative 4 %   Eosinophils Absolute 0.2 0.0 - 0.5 K/uL   Basophils Relative 1 %   Basophils Absolute 0.0 0.0 - 0.1 K/uL   Immature Granulocytes 0 %   Abs Immature Granulocytes 0.02 0.00 - 0.07 K/uL    Comment: Performed at [facility], [address] [place], [place] [address]  Urinalysis, Routine w reflex microscopic -Urine, Unspecified Source     Status: Abnormal   Collection Time: 08/24/23 10:25 AM  Result Value Ref Range   Color, Urine STRAW (A) YELLOW   APPearance CLEAR (A) CLEAR   Specific Gravity, Urine 1.004 (L) 1.005 - 1.030   pH 8.0 5.0 - 8.0   Glucose, UA NEGATIVE NEGATIVE mg/dL   Hgb urine dipstick NEGATIVE NEGATIVE   Bilirubin Urine NEGATIVE NEGATIVE   Ketones, ur NEGATIVE NEGATIVE mg/dL   Protein, ur NEGATIVE NEGATIVE mg/dL   Nitrite NEGATIVE NEGATIVE   Leukocytes,Ua TRACE (A) NEGATIVE   RBC / HPF 0 0 - 5 RBC/hpf   WBC, UA 0-5 0 - 5 WBC/hpf   Bacteria, UA RARE (A) NONE SEEN   Squamous Epithelial / HPF 0-5 0 - 5 /HPF    Comment: Performed at [facility], [address] [place], [place] [address]  Troponin I (High Sensitivity)     Status: None   Collection Time: 08/24/23 11:49 AM  Result Value Ref Range   Troponin I (High Sensitivity) 7 <18 ng/L    Comment: (NOTE) Elevated high sensitivity troponin I (hsTnI) values and significant  changes across serial measurements may suggest ACS but many other  chronic and acute conditions are known to elevate hsTnI results.  Refer to the "Links" section for chest pain algorithms and additional  guidance. Performed at [facility], [address] [place], [place] [address]   Protime-INR     Status: Abnormal   Collection Time: 08/24/23 11:49 AM  Result Value Ref Range   Prothrombin Time 28.0 (H) 11.4 - 15.2 seconds   INR 2.6 (H) 0.8 - 1.2    Comment: (NOTE) INR goal varies based on device and disease states. Performed at [facility], [address], [place], [place] [address]       Assessment & Plan:  Referral to wound clinic. Start p.o. Augmentin. Local wound care. Problem List Items Addressed This Visit     GERD (gastroesophageal reflux disease)   Hyperlipidemia   Essential hypertension, benign   Other Visit Diagnoses     Laceration of skin of left lower leg, subsequent encounter    -  Primary   Acute post-traumatic wound infection, initial encounter       Relevant Medications   amoxicillin-clavulanate (AUGMENTIN) 500-125 MG tablet   Other Relevant Orders   Ambulatory referral to Wound Clinic       Return in about 10  days (around 09/05/2023).   Total time spent: 30 minutes  Margaretann Loveless, MD  08/26/2023   This document may have been prepared by [facility] Voice Recognition software and as such may include unintentional dictation errors.

## 2023-08-29 NOTE — Telephone Encounter (Signed)
Pt called and stated that she called the wound clinic and the first available appt with them will be December 26th, unless there is a cancellation. She wanted to make sure that waiting that long will be ok, or what you would recommend- she is still on the abx,

## 2023-08-30 ENCOUNTER — Other Ambulatory Visit: Payer: Self-pay

## 2023-08-30 ENCOUNTER — Inpatient Hospital Stay
Admission: EM | Admit: 2023-08-30 | Discharge: 2023-09-02 | DRG: 603 | Disposition: A | Discharge status: Home / Self Care | Payer: Medicare PPO | Attending: Internal Medicine | Admitting: Internal Medicine

## 2023-08-30 ENCOUNTER — Encounter: Payer: Self-pay | Admitting: Internal Medicine

## 2023-08-30 DIAGNOSIS — Z79899 Other long term (current) drug therapy: Secondary | ICD-10-CM

## 2023-08-30 DIAGNOSIS — K219 Gastro-esophageal reflux disease without esophagitis: Secondary | ICD-10-CM | POA: Diagnosis present

## 2023-08-30 DIAGNOSIS — E785 Hyperlipidemia, unspecified: Secondary | ICD-10-CM | POA: Diagnosis not present

## 2023-08-30 DIAGNOSIS — G4733 Obstructive sleep apnea (adult) (pediatric): Secondary | ICD-10-CM | POA: Diagnosis present

## 2023-08-30 DIAGNOSIS — Z85820 Personal history of malignant melanoma of skin: Secondary | ICD-10-CM | POA: Diagnosis not present

## 2023-08-30 DIAGNOSIS — Z86718 Personal history of other venous thrombosis and embolism: Secondary | ICD-10-CM

## 2023-08-30 DIAGNOSIS — D649 Anemia, unspecified: Secondary | ICD-10-CM | POA: Diagnosis not present

## 2023-08-30 DIAGNOSIS — L03116 Cellulitis of left lower limb: Secondary | ICD-10-CM | POA: Diagnosis not present

## 2023-08-30 DIAGNOSIS — I10 Essential (primary) hypertension: Secondary | ICD-10-CM | POA: Diagnosis not present

## 2023-08-30 DIAGNOSIS — Z96642 Presence of left artificial hip joint: Secondary | ICD-10-CM | POA: Diagnosis present

## 2023-08-30 DIAGNOSIS — I2510 Atherosclerotic heart disease of native coronary artery without angina pectoris: Secondary | ICD-10-CM | POA: Diagnosis not present

## 2023-08-30 DIAGNOSIS — G629 Polyneuropathy, unspecified: Secondary | ICD-10-CM

## 2023-08-30 DIAGNOSIS — Z888 Allergy status to other drugs, medicaments and biological substances status: Secondary | ICD-10-CM

## 2023-08-30 DIAGNOSIS — M792 Neuralgia and neuritis, unspecified: Secondary | ICD-10-CM | POA: Diagnosis present

## 2023-08-30 DIAGNOSIS — I129 Hypertensive chronic kidney disease with stage 1 through stage 4 chronic kidney disease, or unspecified chronic kidney disease: Secondary | ICD-10-CM | POA: Diagnosis present

## 2023-08-30 DIAGNOSIS — L0390 Cellulitis, unspecified: Secondary | ICD-10-CM | POA: Insufficient documentation

## 2023-08-30 DIAGNOSIS — I480 Paroxysmal atrial fibrillation: Secondary | ICD-10-CM | POA: Diagnosis present

## 2023-08-30 DIAGNOSIS — Z7901 Long term (current) use of anticoagulants: Secondary | ICD-10-CM | POA: Diagnosis not present

## 2023-08-30 DIAGNOSIS — G43909 Migraine, unspecified, not intractable, without status migrainosus: Secondary | ICD-10-CM | POA: Diagnosis present

## 2023-08-30 DIAGNOSIS — Z882 Allergy status to sulfonamides status: Secondary | ICD-10-CM | POA: Diagnosis not present

## 2023-08-30 DIAGNOSIS — Z91041 Radiographic dye allergy status: Secondary | ICD-10-CM | POA: Diagnosis not present

## 2023-08-30 DIAGNOSIS — S81802A Unspecified open wound, left lower leg, initial encounter: Secondary | ICD-10-CM | POA: Diagnosis not present

## 2023-08-30 DIAGNOSIS — Z7983 Long term (current) use of bisphosphonates: Secondary | ICD-10-CM | POA: Diagnosis not present

## 2023-08-30 DIAGNOSIS — F32A Depression, unspecified: Secondary | ICD-10-CM | POA: Diagnosis not present

## 2023-08-30 DIAGNOSIS — M79605 Pain in left leg: Secondary | ICD-10-CM | POA: Diagnosis not present

## 2023-08-30 LAB — CBC WITH DIFFERENTIAL/PLATELET
Abs Immature Granulocytes: 0.02 10*3/uL (ref 0.00–0.07)
Basophils Absolute: 0.1 10*3/uL (ref 0.0–0.1)
Basophils Relative: 1 %
Eosinophils Absolute: 0.3 10*3/uL (ref 0.0–0.5)
Eosinophils Relative: 5 %
HCT: 33 % — ABNORMAL LOW (ref 36.0–46.0)
Hemoglobin: 10.3 g/dL — ABNORMAL LOW (ref 12.0–15.0)
Immature Granulocytes: 0 %
Lymphocytes Relative: 25 %
Lymphs Abs: 1.8 10*3/uL (ref 0.7–4.0)
MCH: 29.5 pg (ref 26.0–34.0)
MCHC: 31.2 g/dL (ref 30.0–36.0)
MCV: 94.6 fL (ref 80.0–100.0)
Monocytes Absolute: 0.5 10*3/uL (ref 0.1–1.0)
Monocytes Relative: 8 %
Neutro Abs: 4.4 10*3/uL (ref 1.7–7.7)
Neutrophils Relative %: 61 %
Platelets: 225 10*3/uL (ref 150–400)
RBC: 3.49 MIL/uL — ABNORMAL LOW (ref 3.87–5.11)
RDW: 12.6 % (ref 11.5–15.5)
WBC: 7.1 10*3/uL (ref 4.0–10.5)
nRBC: 0 % (ref 0.0–0.2)

## 2023-08-30 LAB — COMPREHENSIVE METABOLIC PANEL
ALT: 12 U/L (ref 0–44)
AST: 16 U/L (ref 15–41)
Albumin: 3.5 g/dL (ref 3.5–5.0)
Alkaline Phosphatase: 61 U/L (ref 38–126)
Anion gap: 5 (ref 5–15)
BUN: 18 mg/dL (ref 8–23)
CO2: 27 mmol/L (ref 22–32)
Calcium: 9 mg/dL (ref 8.9–10.3)
Chloride: 107 mmol/L (ref 98–111)
Creatinine, Ser: 0.76 mg/dL (ref 0.44–1.00)
GFR, Estimated: >60 mL/min (ref >60)
Glucose, Bld: 91 mg/dL (ref 70–99)
Potassium: 4.3 mmol/L (ref 3.5–5.1)
Sodium: 139 mmol/L (ref 135–145)
Total Bilirubin: 0.7 mg/dL (ref <1.2)
Total Protein: 6.3 g/dL — ABNORMAL LOW (ref 6.5–8.1)

## 2023-08-30 LAB — LACTIC ACID, PLASMA: Lactic Acid, Venous: 0.7 mmol/L (ref 0.5–1.9)

## 2023-08-30 LAB — PROTIME-INR
INR: 4.1 (ref 0.8–1.2)
Prothrombin Time: 40.2 s — ABNORMAL HIGH (ref 11.4–15.2)

## 2023-08-30 MED ORDER — ACETAMINOPHEN 650 MG RE SUPP: 650.0000 mg | Freq: Four times a day (QID) | RECTAL | Status: DC | PRN

## 2023-08-30 MED ORDER — HYDRALAZINE HCL 20 MG/ML IJ SOLN: 10.0000 mg | Freq: Four times a day (QID) | INTRAMUSCULAR | Status: DC | PRN

## 2023-08-30 MED ORDER — MELATONIN 5 MG PO TABS
5.0000 mg | ORAL_TABLET | Freq: Every evening | ORAL | Status: DC | PRN
  Administered 2023-09-01: 5 mg via ORAL
  Filled 2023-08-30: qty 1

## 2023-08-30 MED ORDER — ROSUVASTATIN CALCIUM 10 MG PO TABS
40.0000 mg | ORAL_TABLET | Freq: Every day | ORAL | Status: DC
  Administered 2023-08-30 – 2023-09-01 (×3): 40 mg via ORAL
  Filled 2023-08-30 (×3): qty 4

## 2023-08-30 MED ORDER — HEPARIN SODIUM (PORCINE) 5000 UNIT/ML IJ SOLN: 5000.0000 [IU] | Freq: Three times a day (TID) | INTRAMUSCULAR | Status: DC

## 2023-08-30 MED ORDER — MORPHINE SULFATE (PF) 2 MG/ML IV SOLN: 2.0000 mg | INTRAVENOUS | Status: DC | PRN

## 2023-08-30 MED ORDER — METOPROLOL SUCCINATE ER 25 MG PO TB24
25.0000 mg | ORAL_TABLET | Freq: Every day | ORAL | Status: DC
  Administered 2023-08-30 – 2023-09-01 (×3): 25 mg via ORAL
  Filled 2023-08-30 (×3): qty 1

## 2023-08-30 MED ORDER — VITAMIN B-12 1000 MCG PO TABS
1000.0000 ug | ORAL_TABLET | Freq: Every day | ORAL | Status: DC
  Administered 2023-08-31 – 2023-09-02 (×3): 1000 ug via ORAL
  Filled 2023-08-30 (×3): qty 1

## 2023-08-30 MED ORDER — AZELASTINE HCL 0.1 % NA SOLN: 2.0000 | Freq: Every evening | NASAL | Status: DC | PRN

## 2023-08-30 MED ORDER — LOSARTAN POTASSIUM 25 MG PO TABS
25.0000 mg | ORAL_TABLET | Freq: Every day | ORAL | Status: DC
  Administered 2023-08-31 – 2023-09-02 (×3): 25 mg via ORAL
  Filled 2023-08-30 (×3): qty 1

## 2023-08-30 MED ORDER — OXYCODONE HCL 5 MG PO TABS
5.0000 mg | ORAL_TABLET | Freq: Four times a day (QID) | ORAL | Status: AC | PRN
  Administered 2023-08-30: 5 mg via ORAL
  Filled 2023-08-30: qty 1

## 2023-08-30 MED ORDER — SERTRALINE HCL 50 MG PO TABS
25.0000 mg | ORAL_TABLET | Freq: Every day | ORAL | Status: DC
  Administered 2023-08-30 – 2023-09-01 (×3): 25 mg via ORAL
  Filled 2023-08-30 (×3): qty 1

## 2023-08-30 MED ORDER — HYDRALAZINE HCL 25 MG PO TABS
25.0000 mg | ORAL_TABLET | Freq: Two times a day (BID) | ORAL | Status: DC
  Administered 2023-08-30 – 2023-09-02 (×6): 25 mg via ORAL
  Filled 2023-08-30 (×6): qty 1

## 2023-08-30 MED ORDER — ONDANSETRON HCL 4 MG PO TABS: 4.0000 mg | ORAL_TABLET | Freq: Four times a day (QID) | ORAL | Status: DC | PRN

## 2023-08-30 MED ORDER — SPIRONOLACTONE 25 MG PO TABS
25.0000 mg | ORAL_TABLET | Freq: Every day | ORAL | Status: DC
  Administered 2023-08-31: 25 mg via ORAL
  Filled 2023-08-30: qty 1

## 2023-08-30 MED ORDER — NITROGLYCERIN 0.4 MG SL SUBL: 0.4000 mg | SUBLINGUAL_TABLET | SUBLINGUAL | Status: DC | PRN

## 2023-08-30 MED ORDER — HYDRALAZINE HCL 20 MG/ML IJ SOLN
5.0000 mg | Freq: Four times a day (QID) | INTRAMUSCULAR | Status: DC | PRN
  Administered 2023-08-30: 5 mg via INTRAVENOUS
  Filled 2023-08-30: qty 1

## 2023-08-30 MED ORDER — MORPHINE SULFATE (PF) 2 MG/ML IV SOLN: 1.0000 mg | INTRAVENOUS | Status: AC | PRN

## 2023-08-30 MED ORDER — SODIUM CHLORIDE 0.9 % IV SOLN: 1.0000 g | INTRAVENOUS | Status: DC

## 2023-08-30 MED ORDER — SENNOSIDES-DOCUSATE SODIUM 8.6-50 MG PO TABS: 1.0000 | ORAL_TABLET | Freq: Every evening | ORAL | Status: DC | PRN

## 2023-08-30 MED ORDER — MECLIZINE HCL 25 MG PO TABS: 12.5000 mg | ORAL_TABLET | Freq: Three times a day (TID) | ORAL | Status: DC | PRN

## 2023-08-30 MED ORDER — FLUTICASONE PROPIONATE 50 MCG/ACT NA SUSP: 2.0000 | Freq: Every day | NASAL | Status: DC | PRN

## 2023-08-30 MED ORDER — SERTRALINE HCL 50 MG PO TABS
50.0000 mg | ORAL_TABLET | Freq: Every day | ORAL | Status: DC
  Administered 2023-08-31 – 2023-09-02 (×3): 50 mg via ORAL
  Filled 2023-08-30 (×3): qty 1

## 2023-08-30 MED ORDER — PANTOPRAZOLE SODIUM 40 MG PO TBEC
40.0000 mg | DELAYED_RELEASE_TABLET | Freq: Every day | ORAL | Status: DC
  Administered 2023-08-31 – 2023-09-02 (×3): 40 mg via ORAL
  Filled 2023-08-30 (×3): qty 1

## 2023-08-30 MED ORDER — ONDANSETRON HCL 4 MG/2ML IJ SOLN: 4.0000 mg | Freq: Four times a day (QID) | INTRAMUSCULAR | Status: DC | PRN

## 2023-08-30 MED ORDER — SERTRALINE HCL 50 MG PO TABS: 25.0000 mg | ORAL_TABLET | Freq: Three times a day (TID) | ORAL | Status: DC

## 2023-08-30 MED ORDER — ACETAMINOPHEN 325 MG PO TABS
650.0000 mg | ORAL_TABLET | Freq: Four times a day (QID) | ORAL | Status: DC | PRN
  Administered 2023-08-31 – 2023-09-01 (×2): 650 mg via ORAL
  Filled 2023-08-30 (×2): qty 2

## 2023-08-30 MED ORDER — GABAPENTIN 400 MG PO CAPS
800.0000 mg | ORAL_CAPSULE | Freq: Four times a day (QID) | ORAL | Status: DC
  Administered 2023-08-30 – 2023-09-02 (×10): 800 mg via ORAL
  Filled 2023-08-30 (×10): qty 2

## 2023-08-30 MED ORDER — SODIUM CHLORIDE 0.9 % IV SOLN
2.0000 g | INTRAVENOUS | Status: DC
  Administered 2023-08-30 – 2023-09-01 (×3): 2 g via INTRAVENOUS
  Filled 2023-08-30 (×4): qty 20

## 2023-08-30 MED ORDER — ISOSORBIDE MONONITRATE ER 30 MG PO TB24
60.0000 mg | ORAL_TABLET | Freq: Every day | ORAL | Status: DC
  Administered 2023-08-30 – 2023-09-01 (×3): 60 mg via ORAL
  Filled 2023-08-30 (×3): qty 2

## 2023-08-30 MED ORDER — ALLOPURINOL 100 MG PO TABS
100.0000 mg | ORAL_TABLET | Freq: Every day | ORAL | Status: DC
  Administered 2023-08-31 – 2023-09-02 (×3): 100 mg via ORAL
  Filled 2023-08-30 (×3): qty 1

## 2023-08-30 NOTE — Hospital Course (Addendum)
Ms. Sarah Riddle is a 82 year female with history of hypertension, history of atrial fibrillation status post ablation, on anticoagulation with warfarin, history of CAD, severe neuropathy, GERD, depression, hyperlipidemia, hereditary hemochromatosis, who presents emergency department for chief concerns of left lower extremity leg wound.  Vitals in the ED showed temperature of 98.3, respiration rate of 20, heart rate of 59, blood pressure 127/71, SpO2 96% on room air.  Serum sodium is 139, potassium 4.3, chloride 107, bicarb 27, BUN of 18, serum creatinine of 0.76, EGFR greater than 60, nonfasting blood glucose 91, WBC 7.1, hemoglobin 10.3, platelets of 225.  Lactic acid was 0.7 on admission.  ED treatment: Ceftriaxone 2 g IV.

## 2023-08-30 NOTE — ED Provider Notes (Signed)
[facility] Provider Note    Event Date/Time   First MD Initiated Contact with Patient 08/30/23 1400     (approximate)   History   Wound Infection   HPI  Sarah Riddle is a 82 year old female with history of HTN, CKD, peripheral neuropathy presenting to the emergency department for evaluation of leg infection.  On 11/20, patient presented after a fall and was found to have a large skin tear over her leg.  At that time, x-rays were without bony injury.  The wound was irrigated and closed with Steri-Strips and tissue adhesive.  She had a follow-up visit with her primary care doctor on 11/22.  At that time, there was concerns for developing infection so she was placed on Augmentin.  Reports she has been taking this as directed, but has continued to have worsening redness of her leg.  No fevers or chills.       Physical Exam   Triage Vital Signs: ED Triage Vitals  Encounter Vitals Group     BP 08/30/23 1144 127/71     Systolic BP Percentile --      Diastolic BP Percentile --      Pulse Rate 08/30/23 1144 (!) 59     Resp 08/30/23 1144 20     Temp 08/30/23 1144 98.3 F (36.8 C)     Temp Source 08/30/23 1144 Oral     SpO2 08/30/23 1144 96 %     Weight 08/30/23 1139 190 lb (86.2 kg)     Height 08/30/23 1139 5\' 11"  (1.803 m)     Head Circumference --      Peak Flow --      Pain Score 08/30/23 1139 5     Pain Loc --      Pain Education --      Exclude from Growth Chart --     Most recent vital signs: Vitals:   08/30/23 1144  BP: 127/71  Pulse: (!) 59  Resp: 20  Temp: 98.3 F (36.8 C)  SpO2: 96%     General: Awake, interactive  CV:  Regular rate, good peripheral perfusion.  Resp:  Unlabored respirations.  Abd:  Nondistended.  Neuro:  Symmetric facial movement, fluid speech MSK:  Large skin tear over the left lower extremity covered with skin glue and Steri-Strips.  When these are removed, some serosanguineous drainage noted.  There is a  large area of blanching erythema with associated warmth and swelling surrounding the left lower extremity.  2+ DP pulses bilaterally.  No erythema of the contralateral extremity.     ED Results / Procedures / Treatments   Labs (all labs ordered are listed, but only abnormal results are displayed) Labs Reviewed  COMPREHENSIVE METABOLIC PANEL - Abnormal; Notable for the following components:      Result Value   Total Protein 6.3 (*)    All other components within normal limits  CBC WITH DIFFERENTIAL/PLATELET - Abnormal; Notable for the following components:   RBC 3.49 (*)    Hemoglobin 10.3 (*)    HCT 33.0 (*)    All other components within normal limits  LACTIC ACID, PLASMA     EKG EKG independently reviewed interpreted by myself (ER attending) demonstrates:    RADIOLOGY Imaging independently reviewed and interpreted by myself demonstrates:    PROCEDURES:  Critical Care performed: No  Procedures   MEDICATIONS ORDERED IN ED: Medications  cefTRIAXone (ROCEPHIN) 2 g in sodium chloride 0.9 % 100 mL IVPB (2  g Intravenous New Bag/Given 08/30/23 1441)  heparin injection 5,000 Units (has no administration in time range)  senna-docusate (Senokot-S) tablet 1 tablet (has no administration in time range)  acetaminophen (TYLENOL) tablet 650 mg (has no administration in time range)    Or  acetaminophen (TYLENOL) suppository 650 mg (has no administration in time range)  ondansetron (ZOFRAN) tablet 4 mg (has no administration in time range)    Or  ondansetron (ZOFRAN) injection 4 mg (has no administration in time range)  oxyCODONE (Oxy IR/ROXICODONE) immediate release tablet 5 mg (has no administration in time range)  morphine (PF) 2 MG/ML injection 2 mg (has no administration in time range)     IMPRESSION / MDM / ASSESSMENT AND PLAN / ED COURSE  I reviewed the triage vital signs and the nursing notes.  Differential diagnosis includes, but is not limited to, wound associated  cellulitis, clinical presentation low suggestive of abscess, low suspicion bony injury given recent negative x-rays  Patient's presentation is most consistent with acute presentation with potential threat to life or bodily function.  82 year old female presenting with worsening lower extremity cellulitis after recent wound, continuing to worsen despite outpatient antibiotics.  Vitals fortunately stable here.  Labs with stable anemia.  Normal lactate.  Does not meet sepsis criteria, but patient with large worsening area of infection with failure of outpatient antibiotics.  With this, do think she is appropriate for admission.  IV Rocephin ordered.  Will reach out to hospitalist team.  Case reviewed with Dr. Sedalia Muta.  She will evaluate the patient for anticipated admission.     FINAL CLINICAL IMPRESSION(S) / ED DIAGNOSES   Final diagnoses:  Cellulitis of left leg     Rx / DC Orders   ED Discharge Orders     None        Note:  This document was prepared using Dragon voice recognition software and may include unintentional dictation errors.   Trinna Post, MD 08/30/23 1500

## 2023-08-30 NOTE — Assessment & Plan Note (Signed)
History of paroxysmal atrial fibrillation status post ablation

## 2023-08-30 NOTE — Assessment & Plan Note (Addendum)
Failed outpatient therapy Status post amoxicillin-clavulanic 500-125 mg outpatient Continue with ceftriaxone 2 g IV daily Symptomatic support: Acetaminophen 650 mg p.o./rectal every 6 hours as needed for mild pain, 5 days ordered; oxycodone 5 mg p.o. every 6 hours.  For moderate pain, 1 day ordered; morphine 1 mg IV every 4 hours as needed for severe pain, 20 hours of coverage ordered Encourage p.o. intake Heart healthy diet ordered Wound care consulted, images in Epic media Admit to telemetry medical, inpatient

## 2023-08-30 NOTE — H&P (Addendum)
History and Physical   Sarah Riddle:[id number] DOB: 1941-01-12 DOA: 08/30/2023  PCP: Margaretann Loveless, MD  Outpatient Specialists: Dr. Cathie Hoops, medical hematology Patient coming from: Home via POV  I have personally briefly reviewed patient's old medical records in [facility] EMR.  Chief Concern: Left leg wound  HPI: Ms. Sarah Riddle is a 82 year female with history of hypertension, history of atrial fibrillation status post ablation, on anticoagulation with warfarin, history of CAD, severe neuropathy, GERD, depression, hyperlipidemia, hereditary hemochromatosis, who presents emergency department for chief concerns of left lower extremity leg wound.  Vitals in the ED showed temperature of 98.3, respiration rate of 20, heart rate of 59, blood pressure 127/71, SpO2 96% on room air.  Serum sodium is 139, potassium 4.3, chloride 107, bicarb 27, BUN of 18, serum creatinine of 0.76, EGFR greater than 60, nonfasting blood glucose 91, WBC 7.1, hemoglobin 10.3, platelets of 225.  Lactic acid was 0.7 on admission.  ED treatment: Ceftriaxone 2 g IV. --------------------------------- At bedside, patient is able to tell me her name, age, current calendar year, current location.   She states that she fell on 08/24/23, and sustained a large skin tear on her left leg.  Patient was seen in the ED on the same day and was treated with wound repair via topical anesthesia and direct pressure with irrigation with sterile saline. Patient had skin repair with Steri-Strips and tissue adhesive, 10 strips were used.  Patient was discharged from the ED.  Patient was advised to follow-up with PCP outpatient in 2 days.   Per patient, patient saw her PCP on Friday, 11/22 and was prescribed antibiotic due to surrounding redness and inflammation noted.  Patient was thought to have cellulitis of the time and was prescribed Augmentin.  Patient endorses compliance with the antibiotic prescribed however the redness  worsened prompting her to present to the emergency department today.    At bedside, she denies chest pain, shortness of breath, abdominal pain, dysuria, hematuria, diarrhea, nausea, vomiting, blood in her stool, loss of consciousness, syncope.  Social history: She lives with her husband and her daughter.  Patient states that her husband is legally blind and requires a lot of help with ADLs.  She denies tobacco use, etoh, and recreational drug use. 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  ROS: Constitutional: no weight change, no fever ENT/Mouth: no sore throat, no rhinorrhea Eyes: no eye pain, no vision changes Cardiovascular: no chest pain, no dyspnea,  no edema, no palpitations Respiratory: no cough, no sputum, no wheezing Gastrointestinal: no nausea, no vomiting, no diarrhea, no constipation Genitourinary: no urinary incontinence, no dysuria, no hematuria Musculoskeletal: no arthralgias, no myalgias Skin: no skin lesions, no pruritus, + left leg wound Neuro: + weakness, no loss of consciousness, no syncope Psych: no anxiety, no depression, no decrease appetite Heme/Lymph: no bruising, no bleeding  ED Course: Discussed with EDP, patient requiring hospitalization for chief concerns of leg wound, failing outpatient therapy.  Assessment/Plan  Principal Problem:   Cellulitis of left leg Active Problems:   Neurogenic pain   Hypertension   GERD (gastroesophageal reflux disease)   Migraine headache   Neuropathy   Obstructive sleep apnea (adult) (pediatric)   PAF (paroxysmal atrial fibrillation) (HCC)   Chronic anticoagulation (Coumadin)   Normocytic anemia   HLD (hyperlipidemia)   Wound cellulitis   CAD (coronary artery disease)  Depression   Assessment and Plan:  * Cellulitis of left leg Failed outpatient therapy Status post amoxicillin-clavulanic 500-125 mg  outpatient Continue with ceftriaxone 2 g IV daily Symptomatic support: Acetaminophen 650 mg p.o./rectal every 6 hours as needed for mild pain, 5 days ordered; oxycodone 5 mg p.o. every 6 hours.  For moderate pain, 1 day ordered; morphine 1 mg IV every 4 hours as needed for severe pain, 20 hours of coverage ordered Encourage p.o. intake Heart healthy diet ordered Wound care consulted, images in Epic media Admit to telemetry medical, inpatient  Neurogenic pain Home gabapentin 800 mg 4 times daily resumed on admission PDMP reviewed  Depression Home sertraline 25 mg 3 times daily resumed on admission  CAD (coronary artery disease) Resumed home warfarin per pharmacy, Imdur 60 mg daily, rosuvastatin 40 mg nightly, losartan 25 mg daily, metoprolol succinate 25 mg nightly, spironolactone 25 mg daily  Wound cellulitis In setting of history of CAD and failing outpatient therapy Patient's pedal pulses were difficult to palpate ABI has been ordered on admission, pending completion Would recommend a.m. team to consider vascular consultation as needed pending ABI  HLD (hyperlipidemia) Home rosuvastatin 40 mg nightly resumed  Normocytic anemia Appears chronic, recommend continue outpatient follow-up with PCP At baseline on admission  PAF (paroxysmal atrial fibrillation) (HCC) History of paroxysmal atrial fibrillation status post ablation  Obstructive sleep apnea (adult) (pediatric) CPAP nightly ordered on admission  GERD (gastroesophageal reflux disease) Home PPI resumed  Hypertension Hydralazine 5 mg IV every 6 hours as needed for SBP greater than 170, 4 days ordered  Chart reviewed.   DVT prophylaxis: Warfarin per pharmacy Code Status: Full code Diet: Heart healthy Family Communication: A phone call was offered, patient declined stating that she updated her daughter ready Disposition Plan: Pending clinical course Consults called: Wound nurse, pharmacy Admission status: Telemetry  medical, inpatient  Past Medical History:  Diagnosis Date   A-fib (HCC)    Accelerated hypertension 10/31/2022   Age related osteoporosis    Cancer (HCC)    Melanoma x 2 -- Lt Leg -1976 Rt Arm - 2014   Chronic bilateral low back pain    Chronic kidney disease    Complication of anesthesia    DVT (deep venous thrombosis) (HCC) 1960   right leg  was on BC pills   Dysrhythmia    Family history of breast cancer    Family history of colon cancer    Family history of melanoma    Family history of prostate cancer    Fibrocystic breast disease    GERD (gastroesophageal reflux disease)    Headache    migraine   Heart murmur    not heard now.   History of kidney stones    Hyperlipidemia    Hypertension    IBS (irritable bowel syndrome)    Lumbago    Mitral valve prolapse    Neuropathy    Obstructive sleep apnea    Osteoarthritis of shoulder    PONV (postoperative nausea and vomiting)    Past Surgical History:  Procedure Laterality Date   ABDOMINAL HYSTERECTOMY     BACK SURGERY     BREAST BIOPSY Left 04/20/2023   [place] bx/ heart clip/ path pending   BREAST BIOPSY Left 04/20/2023   [place] LT BREAST BX W LOC DEV 1ST LESION IMG BX SPEC US GUIDE 04/20/2023 [facility]   BREAST CYST ASPIRATION Left 2001   Negative   CARDIAC ELECTROPHYSIOLOGY MAPPING AND ABLATION     EYE SURGERY  HUMERUS IM NAIL Right 05/05/2022   Procedure: INTRAMEDULLARY (IM) NAIL HUMERAL;  Surgeon: Roby Lofts, MD;  Location: [facility] OR;  Service: Orthopedics;  Laterality: Right;   HYSTEROTOMY     L4-L5  2020   titaum rebok cage   LEFT HEART CATH AND CORONARY ANGIOGRAPHY N/A 11/01/2022   Procedure: LEFT HEART CATH AND CORONARY ANGIOGRAPHY and possible PCI and stent;  Surgeon: Laurier Nancy, MD;  Location: [facility] INVASIVE CV LAB;  Service: Cardiovascular;  Laterality: N/A;   TOTAL HIP ARTHROPLASTY Left    Social History:  reports that she has never smoked. She has never used smokeless tobacco. She reports that  she does not drink alcohol and does not use drugs.  Allergies  Allergen Reactions   Iodinated Contrast Media Shortness Of Breath and Other (See Comments)    Can be premedicated  Have been able to tolerate if given premeds (benadryl, prednisone)   Ibuprofen Other (See Comments)    "stomach problems"    Lubiprostone Nausea And Vomiting        Sulfa Antibiotics Other (See Comments)    "Stomach problems"  "Stomach problems"  "Stomach problems"  "Stomach problems"   Zolpidem Other (See Comments)    "hallucinations"  Other reaction(s): Confusion  Other Reaction(s): confusion   Boniva [Ibandronic Acid] Diarrhea and Nausea And Vomiting   Celecoxib Other (See Comments)    GI upset    Doxycycline Diarrhea and Nausea And Vomiting   Duloxetine Other (See Comments)    Over sedation with Zelnorm (IBS med)   Tetracyclines & Related Nausea Only and Nausea And Vomiting   Family History  Problem Relation Age of Onset   Colon cancer Mother 72   Prostate cancer Father        dx 60s   Cancer Sister    Acute myelogenous leukemia Sister        dx 25s   Prostate cancer Brother    Melanoma Brother    Prostate cancer Brother    Melanoma Brother    HIV/AIDS Brother 71   Tuberculosis Paternal Aunt    Melanoma Other    Breast cancer Other    Melanoma Niece    Family history: Family history reviewed and not pertinent.  Prior to Admission medications   Medication Sig Start Date End Date Taking? Authorizing Provider  acetaminophen (TYLENOL) 500 MG tablet Take 500 mg by mouth every 6 (six) hours as needed. Once a day    [provider]  alendronate (FOSAMAX) 70 MG tablet Take 1 tablet (70 mg total) by mouth every Thursday. Take with a full glass of water on an empty stomach. 07/14/23   Margaretann Loveless, MD  allopurinol (ZYLOPRIM) 100 MG tablet Take 100 mg by mouth daily. 06/22/17   [provider]  amoxicillin-clavulanate (AUGMENTIN) 500-125 MG tablet Take 1 tablet by mouth  in the morning and at bedtime. 08/26/23   Margaretann Loveless, MD  azelastine (ASTELIN) 0.1 % nasal spray Place 2 sprays into both nostrils at bedtime. Use in each nostril as directed 07/14/23   Margaretann Loveless, MD  Cranberry 125 MG TABS Take 125 mg by mouth daily.    [provider]  cyanocobalamin (VITAMIN B12) 1000 MCG tablet Take 1,000 mcg by mouth daily. Monday to Friday    [provider]  EPINEPHrine 0.3 mg/0.3 mL IJ SOAJ injection Inject 0.3 mg into the muscle as needed. 06/02/21   [provider]  fluticasone (FLONASE) 50 MCG/ACT nasal spray Place  2 sprays into both nostrils daily. 07/31/17   [provider]  gabapentin (NEURONTIN) 800 MG tablet TAKE 1 TABLET FOUR TIMES DAILY 03/17/23   Miki Kins, FNP  hydrALAZINE (APRESOLINE) 25 MG tablet TAKE 1 TABLET TWICE DAILY 07/25/23   Adrian Blackwater A, MD  isosorbide mononitrate (IMDUR) 60 MG 24 hr tablet TAKE 1 TABLET EVERY DAY 03/08/23   Laurier Nancy, MD  levocetirizine (XYZAL) 5 MG tablet Take 1 tablet (5 mg total) by mouth every evening. 07/18/23   Margaretann Loveless, MD  losartan (COZAAR) 25 MG tablet Take 25 mg by mouth daily.    [provider]  meclizine (ANTIVERT) 12.5 MG tablet Take 1 tablet (12.5 mg total) by mouth 3 (three) times daily as needed for dizziness. 01/31/23   [name], NP  metoprolol succinate (TOPROL-XL) 25 MG 24 hr tablet TAKE 1 TABLET EVERY DAY 03/08/23   Laurier Nancy, MD  nitroGLYCERIN (NITROSTAT) 0.4 MG SL tablet Place 0.4 mg under the tongue every 5 (five) minutes as needed for chest pain. 01/05/22   [provider]  pantoprazole (PROTONIX) 40 MG tablet TAKE 1 TABLET EVERY DAY 03/17/23   Laurier Nancy, MD  Polyethyl Glyc-Propyl Glyc PF (SYSTANE HYDRATION PF) 0.4-0.3 % SOLN Apply to eye.    [provider]  rosuvastatin (CRESTOR) 40 MG tablet TAKE 1 TABLET EVERY DAY 08/16/23   Adrian Blackwater A, MD  sertraline (ZOLOFT) 25 MG tablet Take 25 mg by mouth 3  (three) times daily. 2 tabs in AM 1 HS    [provider]  spironolactone (ALDACTONE) 25 MG tablet Take 1 tablet (25 mg total) by mouth daily. 12/14/22 12/14/23  Laurier Nancy, MD  Vitamin D, Ergocalciferol, 50000 units CAPS TAKE 1 CAPSULE BY MOUTH ONCE WEEKLY. 01/03/23   [name], NP  warfarin (COUMADIN) 3 MG tablet Take 1 tablet (3 mg total) by mouth daily. 07/11/23   Margaretann Loveless, MD   Physical Exam: Vitals:   08/30/23 1144 08/30/23 1600 08/30/23 1650 08/30/23 1736  BP: 127/71 (!) 177/74 (!) 197/76 (!) 178/70  Pulse: (!) 59 66 71 79  Resp: 20 17 16    Temp: 98.3 F (36.8 C) 97.8 F (36.6 C) 98.1 F (36.7 C)   TempSrc: Oral  Oral   SpO2: 96% 96% 96%   Weight:      Height:       Constitutional: appears frail, age-appropriate, calm Eyes: PERRL, lids and conjunctivae normal ENMT: Mucous membranes are moist. Posterior pharynx clear of any exudate or lesions. Age-appropriate dentition. Hearing appropriate Neck: normal, supple, no masses, no thyromegaly Respiratory: clear to auscultation bilaterally, no wheezing, no crackles. Normal respiratory effort. No accessory muscle use.  Cardiovascular: Regular rate and rhythm, no murmurs / rubs / gallops. No extremity edema.  Unable to appreciate pedal pulses on palpation. No carotid bruits.  Abdomen: no tenderness, no masses palpated, no hepatosplenomegaly. Bowel sounds positive.  Musculoskeletal: no clubbing / cyanosis. No joint deformity upper and lower extremities. Good ROM, no contractures, no atrophy. Normal muscle tone.  Skin:  Large skin tear with erythema and edema Neurologic: Sensation intact. Strength 5/5 in all 4.  Psychiatric: Normal judgment and insight. Alert and oriented x 3. Normal mood.   EKG: Not indicated at this time  Chest x-ray on Admission: Not indicated at this time  Labs on Admission: I have personally reviewed following labs  CBC: Recent Labs  Lab 08/24/23 1025 08/30/23 1147  WBC 5.4 7.1  NEUTROABS 3.5 4.4  HGB 11.1* 10.3*  HCT 33.8* 33.0*  MCV 90.6 94.6  PLT 201 225   Basic Metabolic Panel: Recent Labs  Lab 08/24/23 1025 08/30/23 1147  NA 141 139  K 3.7 4.3  CL 109 107  CO2 26 27  GLUCOSE 98 91  BUN 14 18  CREATININE 0.80 0.76  CALCIUM 9.4 9.0   GFR: Estimated Creatinine Clearance: 64.8 mL/min (by C-G formula based on SCr of 0.76 mg/dL).  Liver Function Tests: Recent Labs  Lab 08/24/23 1025 08/30/23 1147  AST 19 16  ALT 12 12  ALKPHOS 62 61  BILITOT 0.7 0.7  PROT 6.5 6.3*  ALBUMIN 3.7 3.5   Coagulation Profile: Recent Labs  Lab 08/24/23 1149  INR 2.6*   Urine analysis:    Component Value Date/Time   COLORURINE STRAW (A) 08/24/2023 1025   APPEARANCEUR CLEAR (A) 08/24/2023 1025   LABSPEC 1.004 (L) 08/24/2023 1025   PHURINE 8.0 08/24/2023 1025   GLUCOSEU NEGATIVE 08/24/2023 1025   HGBUR NEGATIVE 08/24/2023 1025   BILIRUBINUR NEGATIVE 08/24/2023 1025   KETONESUR NEGATIVE 08/24/2023 1025   PROTEINUR NEGATIVE 08/24/2023 1025   NITRITE NEGATIVE 08/24/2023 1025   LEUKOCYTESUR TRACE (A) 08/24/2023 1025   This document was prepared using Dragon Voice Recognition software and may include unintentional dictation errors.  Dr. Sedalia Muta [facility]  If 7PM-7AM, please contact overnight-coverage provider If 7AM-7PM, please contact day attending provider [email or web address]  08/30/2023, 6:21 PM

## 2023-08-30 NOTE — Assessment & Plan Note (Addendum)
In setting of history of CAD and failing outpatient therapy Patient's pedal pulses were difficult to palpate ABI has been ordered on admission, pending completion Would recommend a.m. team to consider vascular consultation as needed pending ABI

## 2023-08-30 NOTE — ED Triage Notes (Signed)
Pt via POV from home. Pt c/o L leg wound on her shin for a fall she was seen for on 11/20. States that she believes the leg is getting infection. Denies hx of DM. Pt does take warfarin. States that PCP placed her on abx on Friday and it has not helped. Pt is A&OX4 and NAD

## 2023-08-30 NOTE — Consult Note (Signed)
PHARMACY - ANTICOAGULATION CONSULT NOTE  Pharmacy Consult for warfarin Indication: atrial fibrillation  Allergies  Allergen Reactions   Iodinated Contrast Media Shortness Of Breath and Other (See Comments)    Can be premedicated  Have been able to tolerate if given premeds (benadryl, prednisone)   Ibuprofen Other (See Comments)    "stomach problems"    Lubiprostone Nausea And Vomiting        Sulfa Antibiotics Other (See Comments)    "Stomach problems"  "Stomach problems"  "Stomach problems"  "Stomach problems"   Zolpidem Other (See Comments)    "hallucinations"  Other reaction(s): Confusion  Other Reaction(s): confusion   Boniva [Ibandronic Acid] Diarrhea and Nausea And Vomiting   Celecoxib Other (See Comments)    GI upset    Doxycycline Diarrhea and Nausea And Vomiting   Duloxetine Other (See Comments)    Over sedation with Zelnorm (IBS med)   Tetracyclines & Related Nausea Only and Nausea And Vomiting   Patient Measurements: Height: 5\' 11"  (180.3 cm) Weight: 86.2 kg (190 lb) IBW/kg (Calculated) : 70.8 Heparin Dosing Weight: NA  Vital Signs: Temp: 98.1 F (36.7 C) (11/26 1650) Temp Source: Oral (11/26 1650) BP: 178/70 (11/26 1736) Pulse Rate: 79 (11/26 1736)  Labs: Recent Labs    08/30/23 1147 08/30/23 1818  HGB 10.3*  --   HCT 33.0*  --   PLT 225  --   LABPROT  --  40.2*  INR  --  4.1*  CREATININE 0.76  --     Estimated Creatinine Clearance: 64.8 mL/min (by C-G formula based on SCr of 0.76 mg/dL).   Medical History: Past Medical History:  Diagnosis Date   A-fib [facility])    Accelerated hypertension 10/31/2022   Age related osteoporosis    Cancer (HCC)    Melanoma x 2 -- Lt Leg -1976 Rt Arm - 2014   Chronic bilateral low back pain    Chronic kidney disease    Complication of anesthesia    DVT (deep venous thrombosis) (HCC) 1960   right leg  was on BC pills   Dysrhythmia    Family history of breast cancer    Family history of colon cancer     Family history of melanoma    Family history of prostate cancer    Fibrocystic breast disease    GERD (gastroesophageal reflux disease)    Headache    migraine   Heart murmur    not heard now.   History of kidney stones    Hyperlipidemia    Hypertension    IBS (irritable bowel syndrome)    Lumbago    Mitral valve prolapse    Neuropathy    Obstructive sleep apnea    Osteoarthritis of shoulder    PONV (postoperative nausea and vomiting)    Medications:  Per PTA took a dose yesterday  Baseline Labs: INR = 4.1  Home dose: Per patient (per MD) takes 3 mg warfarin daily  Per dispense records patient takes 3 mg daily   Assessment: DM is a 82 yo f who presented to the ED with a left leg wound. She has a past medical history of atrial fibrillation s/p ablation, CAD, severe neuropathy, GERD, depression, HDL. Pharmacy has been consulted to manage warfarin.   Level: Time INR Clinic  11/26 @1818  4.1 Supratherapeutic               Goal of Therapy:  INR 2-3 Monitor platelets by anticoagulation protocol: Yes   Plan:  Hold  warfarin  Check INR with morning labs   Effie Shy, PharmD Pharmacy Resident  08/30/2023 7:47 PM

## 2023-08-30 NOTE — Assessment & Plan Note (Signed)
Home rosuvastatin 40 mg nightly resumed

## 2023-08-30 NOTE — Assessment & Plan Note (Addendum)
CPAP nightly ordered on admission

## 2023-08-30 NOTE — Assessment & Plan Note (Addendum)
Hydralazine 5 mg IV every 6 hours as needed for SBP greater than 170, 4 days ordered

## 2023-08-30 NOTE — ED Notes (Signed)
See triage notes. Patient c/o redness and swelling to a wound on her lower left leg. Patient was referred to the wound clinic but was told she wouldn't be able to be seen until 12/26

## 2023-08-30 NOTE — Assessment & Plan Note (Signed)
-   Home PPI resumed 

## 2023-08-30 NOTE — Assessment & Plan Note (Signed)
Home sertraline 25 mg 3 times daily resumed on admission

## 2023-08-30 NOTE — Assessment & Plan Note (Addendum)
Resumed home warfarin per pharmacy, Imdur 60 mg daily, rosuvastatin 40 mg nightly, losartan 25 mg daily, metoprolol succinate 25 mg nightly, spironolactone 25 mg daily

## 2023-08-30 NOTE — Assessment & Plan Note (Signed)
Appears chronic, recommend continue outpatient follow-up with PCP At baseline on admission

## 2023-08-30 NOTE — Assessment & Plan Note (Addendum)
Home gabapentin 800 mg 4 times daily resumed on admission PDMP reviewed

## 2023-08-31 ENCOUNTER — Inpatient Hospital Stay: Payer: Medicare PPO

## 2023-08-31 DIAGNOSIS — L03116 Cellulitis of left lower limb: Secondary | ICD-10-CM | POA: Diagnosis not present

## 2023-08-31 LAB — BASIC METABOLIC PANEL
Anion gap: 7 (ref 5–15)
BUN: 18 mg/dL (ref 8–23)
CO2: 25 mmol/L (ref 22–32)
Calcium: 9.1 mg/dL (ref 8.9–10.3)
Chloride: 105 mmol/L (ref 98–111)
Creatinine, Ser: 0.78 mg/dL (ref 0.44–1.00)
GFR, Estimated: >60 mL/min (ref >60)
Glucose, Bld: 95 mg/dL (ref 70–99)
Potassium: 4.1 mmol/L (ref 3.5–5.1)
Sodium: 137 mmol/L (ref 135–145)

## 2023-08-31 LAB — CBC
HCT: 30.8 % — ABNORMAL LOW (ref 36.0–46.0)
Hemoglobin: 10.1 g/dL — ABNORMAL LOW (ref 12.0–15.0)
MCH: 29.4 pg (ref 26.0–34.0)
MCHC: 32.8 g/dL (ref 30.0–36.0)
MCV: 89.8 fL (ref 80.0–100.0)
Platelets: 220 10*3/uL (ref 150–400)
RBC: 3.43 MIL/uL — ABNORMAL LOW (ref 3.87–5.11)
RDW: 12.6 % (ref 11.5–15.5)
WBC: 7.1 10*3/uL (ref 4.0–10.5)
nRBC: 0 % (ref 0.0–0.2)

## 2023-08-31 LAB — PROTIME-INR
INR: 3.9 — ABNORMAL HIGH (ref 0.8–1.2)
Prothrombin Time: 38.5 s — ABNORMAL HIGH (ref 11.4–15.2)

## 2023-08-31 NOTE — TOC CM/SW Note (Signed)
Transition of Care [facility]) - Inpatient Brief Assessment   Patient Details  Name: Sarah Riddle MRN: [id number] Date of Birth: Feb 10, 1941  Transition of Care [facility]) CM/SW Contact:    Chapman Fitch, RN Phone Number: 08/31/2023, 12:11 PM   Clinical Narrative:  Admitted for: cellulitis Admitted from: home with husband and daughter PCP: Welton Flakes   Transition of Care [facility]) Screening Note   Patient Details  Name: Sarah Riddle Date of Birth: 1941-08-17   Transition of Care [facility]) CM/SW Contact:    Chapman Fitch, RN Phone Number: 08/31/2023, 12:12 PM    Transition of Care Department [facility]) has reviewed patient and no TOC needs have been identified at this time.If new patient transition needs arise, please place a TOC consult.     Transition of Care Asessment: Insurance and Status: Insurance coverage has been reviewed Patient has primary care physician: Yes     Prior/Current Home Services: No current home services Social Determinants of Health Reivew: SDOH reviewed no interventions necessary Readmission risk has been reviewed: Yes Transition of care needs: no transition of care needs at this time  30 Day Unplanned Readmission Risk Score    Flowsheet Row ED to Hosp-Admission (Current) from 08/30/2023 in [facility]  30 Day Unplanned Readmission Risk Score (%) 24.79 Filed at 08/31/2023 1201       This score is the patient's risk of an unplanned readmission within 30 days of being discharged (0 -100%). The score is based on dignosis, age, lab data, medications, orders, and past utilization.   Low:  0-14.9   Medium: 15-21.9   High: 22-29.9   Extreme: 30 and above            08/31/2023   12:10 PM 11/01/2022    3:44 PM  Readmission Risk Prevention Plan  Transportation Screening Complete Complete  PCP or Specialist Appt within 3-5 Days  Complete  HRI or Home Care Consult  Not Complete  HRI or Home Care Consult comments   NA  Social Work Consult for Recovery Care Planning/Counseling Complete Not Complete  SW consult not completed comments  NA  Palliative Care Screening Not Applicable Not Applicable  Medication Review (RN Care Manager) Complete Referral to Pharmacy

## 2023-08-31 NOTE — Plan of Care (Signed)

## 2023-08-31 NOTE — Progress Notes (Signed)
Progress Note   Patient: Sarah Riddle ZOX:[id number] DOB: 06/11/1941 DOA: 08/30/2023     1 DOS: the patient was seen and examined on 08/31/2023   Brief hospital course: From HPI "Ms. Sarah Riddle is a 82 year female with history of hypertension, history of atrial fibrillation status post ablation, on anticoagulation with warfarin, history of CAD, severe neuropathy, GERD, depression, hyperlipidemia, hereditary hemochromatosis, who presents emergency department for chief concerns of left lower extremity leg wound.    She states that she fell on 08/24/23, and sustained a large skin tear on her left leg.  Patient was seen in the ED on the same day and was treated with wound repair via topical anesthesia and direct pressure with irrigation with sterile saline. Patient had skin repair with Steri-Strips and tissue adhesive, 10 strips were used.  Patient was discharged from the ED.  Patient was advised to follow-up with PCP outpatient in 2 days.    Per patient, patient saw her PCP on Friday, 11/22 and was prescribed antibiotic due to surrounding redness and inflammation noted.  Patient was thought to have cellulitis of the time and was prescribed Augmentin.  Patient endorses compliance with the antibiotic prescribed however the redness worsened prompting her to present to the emergency department today.  "     Assessment and Plan:   Cellulitis of left leg Failed outpatient therapy Status post amoxicillin-clavulanic 500-125 mg outpatient Continue with ceftriaxone 2 g IV daily Symptomatic support: Acetaminophen 650 mg p.o./rectal every 6 hours as needed for mild pain, 5 days ordered; oxycodone 5 mg p.o. every 6 hours.  For moderate pain, 1 day ordered; morphine 1 mg IV every 4 hours as needed for severe pain, 20 hours of coverage ordered Encourage p.o. intake Heart healthy diet ordered Continue wound care management    Neurogenic pain Home gabapentin 800 mg 4 times daily resumed on  admission PDMP reviewed   Depression Continue sertraline 25 mg 3 times daily resumed on admission   CAD (coronary artery disease) Resumed home warfarin per pharmacy, Imdur 60 mg daily, rosuvastatin 40 mg nightly, losartan 25 mg daily, metoprolol succinate 25 mg nightly, spironolactone 25 mg daily   Wound cellulitis In setting of history of CAD and failing outpatient therapy Patient's pedal pulses were difficult to palpate Follow-up on ABI  HLD (hyperlipidemia) Continue statin therapy   Normocytic anemia Appears chronic, recommend continue outpatient follow-up with PCP At baseline on admission   PAF (paroxysmal atrial fibrillation) (HCC) History of paroxysmal atrial fibrillation status post ablation   Obstructive sleep apnea (adult) (pediatric) CPAP nightly ordered on admission   GERD (gastroesophageal reflux disease) Home PPI resumed   Hypertension Hydralazine 5 mg IV every 6 hours as needed for SBP greater than 170, 4 days ordered   Chart reviewed.    DVT prophylaxis: Warfarin per pharmacy Code Status: Full code Diet: Heart healthy Family Communication: A phone call was offered, patient declined stating that she updated her daughter ready Disposition Plan: Pending clinical course Consults called: Wound nurse, pharmacy Admission status: Telemetry medical, inpatient     Subjective:  Patient seen and examined at bedside this morning Admits to improvement in pain following the left leg Redness is improving Patient has been seen by wound care team this morning  Physical Exam:  Constitutional: appears frail, age-appropriate, calm Eyes: PERRL, lids and conjunctivae normal ENMT: Mucous membranes are moist. Posterior pharynx clear of any exudate or lesions. Age-appropriate dentition. Hearing appropriate Neck: normal, supple, no masses, no thyromegaly Respiratory: clear  to auscultation bilaterally, no wheezing, no crackles. Normal respiratory effort. No accessory  muscle use.  Cardiovascular: Regular rate and rhythm, no murmurs / rubs / gallops. No extremity edema.  Unable to appreciate pedal pulses on palpation. No carotid bruits.  Abdomen: no tenderness, no masses palpated, no hepatosplenomegaly. Bowel sounds positive.  Musculoskeletal: no clubbing / cyanosis. No joint deformity upper and lower extremities. Good ROM, no contractures, no atrophy. Normal muscle tone.  Large skin tear with erythema and edema Neurologic: Sensation intact. Strength 5/5 in all 4.  Psychiatric: Normal judgment and insight. Alert and oriented x 3. Normal mood.    Vitals:   08/30/23 1736 08/30/23 2034 08/31/23 0125 08/31/23 0814  BP: (!) 178/70 (!) 154/69 (!) 163/71 (!) 146/64  Pulse: 79 70 77 64  Resp:  16 17 16   Temp:  98.5 F (36.9 C) 98.8 F (37.1 C) 98.6 F (37 C)  TempSrc:  Oral Oral Oral  SpO2:  97% 93% 95%  Weight:      Height:        Data Reviewed:    Latest Ref Rng & Units 08/31/2023    5:19 AM 08/30/2023   11:47 AM 08/24/2023   10:25 AM  CBC  WBC 4.0 - 10.5 K/uL 7.1  7.1  5.4   Hemoglobin 12.0 - 15.0 g/dL 96.2  95.2  84.1   Hematocrit 36.0 - 46.0 % 30.8  33.0  33.8   Platelets 150 - 400 K/uL 220  225  201        Latest Ref Rng & Units 08/31/2023    5:19 AM 08/30/2023   11:47 AM 08/24/2023   10:25 AM  BMP  Glucose 70 - 99 mg/dL 95  91  98   BUN 8 - 23 mg/dL 18  18  14    Creatinine 0.44 - 1.00 mg/dL 3.24  4.01  0.27   Sodium 135 - 145 mmol/L 137  139  141   Potassium 3.5 - 5.1 mmol/L 4.1  4.3  3.7   Chloride 98 - 111 mmol/L 105  107  109   CO2 22 - 32 mmol/L 25  27  26    Calcium 8.9 - 10.3 mg/dL 9.1  9.0  9.4       Time spent: 45 minutes  Author: Loyce Dys, MD 08/31/2023 4:21 PM  For on call review [email or web address].

## 2023-08-31 NOTE — Consult Note (Signed)
WOC Nurse Consult Note: patient fell hitting her L lower leg and sustained skin tear  Reason for Consult: L lower extremity wound  Wound type: full thickness traumatic  Pressure Injury POA: NA  Measurement: 9 cm x 7 cm total area of ecchymosis with 6 cm x  2 cm x 0.1 cm area of skin loss  Wound bed: 100% red  Drainage (amount, consistency, odor) minimal serosanguinous  Periwound: ecchymosis as above  Dressing procedure/placement/frequency: Cleanse L lower anterior leg wound with NS, apply Xeroform gauze [place] [phone]) to wound bed daily, cover with silicone foam.  Change daily while in hospital.  Discussed with patient that she can change every other day at home. May lift silicone foam to replace Xeroform. Change foam q3 days and prn soiling.   Patient [facility] but is on wait list for sooner appointment.    POC discussed with bedside nurse. WOC team will not follow. Re-consult if further needs arise.   Thank you,    Priscella Mann MSN, RN-BC, [employer] [phone]

## 2023-08-31 NOTE — Consult Note (Signed)
PHARMACY - ANTICOAGULATION CONSULT NOTE  Pharmacy Consult for warfarin Indication: atrial fibrillation  Allergies  Allergen Reactions   Iodinated Contrast Media Shortness Of Breath and Other (See Comments)    Can be premedicated  Have been able to tolerate if given premeds (benadryl, prednisone)   Ibuprofen Other (See Comments)    "stomach problems"    Lubiprostone Nausea And Vomiting        Sulfa Antibiotics Other (See Comments)    "Stomach problems"  "Stomach problems"  "Stomach problems"  "Stomach problems"   Zolpidem Other (See Comments)    "hallucinations"  Other reaction(s): Confusion  Other Reaction(s): confusion   Boniva [Ibandronic Acid] Diarrhea and Nausea And Vomiting   Celecoxib Other (See Comments)    GI upset    Doxycycline Diarrhea and Nausea And Vomiting   Duloxetine Other (See Comments)    Over sedation with Zelnorm (IBS med)   Tetracyclines & Related Nausea Only and Nausea And Vomiting   Patient Measurements: Height: 5\' 11"  (180.3 cm) Weight: 86.2 kg (190 lb) IBW/kg (Calculated) : 70.8 Heparin Dosing Weight: NA  Vital Signs: Temp: 98.6 F (37 C) (11/27 0814) Temp Source: Oral (11/27 0814) BP: 146/64 (11/27 0814) Pulse Rate: 64 (11/27 0814)  Labs: Recent Labs    08/30/23 1147 08/30/23 1818 08/31/23 0519  HGB 10.3*  --  10.1*  HCT 33.0*  --  30.8*  PLT 225  --  220  LABPROT  --  40.2* 38.5*  INR  --  4.1* 3.9*  CREATININE 0.76  --  0.78    Estimated Creatinine Clearance: 64.8 mL/min (by C-G formula based on SCr of 0.78 mg/dL).   Medical History: Past Medical History:  Diagnosis Date   A-fib [facility])    Accelerated hypertension 10/31/2022   Age related osteoporosis    Cancer (HCC)    Melanoma x 2 -- Lt Leg -1976 Rt Arm - 2014   Chronic bilateral low back pain    Chronic kidney disease    Complication of anesthesia    DVT (deep venous thrombosis) (HCC) 1960   right leg  was on BC pills   Dysrhythmia    Family history of  breast cancer    Family history of colon cancer    Family history of melanoma    Family history of prostate cancer    Fibrocystic breast disease    GERD (gastroesophageal reflux disease)    Headache    migraine   Heart murmur    not heard now.   History of kidney stones    Hyperlipidemia    Hypertension    IBS (irritable bowel syndrome)    Lumbago    Mitral valve prolapse    Neuropathy    Obstructive sleep apnea    Osteoarthritis of shoulder    PONV (postoperative nausea and vomiting)    Medications:  Per PTA took a dose yesterday  Baseline Labs: INR = 4.1  Home dose: Per patient (per MD) takes 3 mg warfarin daily  Per dispense records patient takes 3 mg daily   Assessment: Sarah Riddle is a 82 yo f who presented to the ED with a left leg wound. She has a past medical history of atrial fibrillation s/p ablation, CAD, severe neuropathy, GERD, depression, HDL. Pharmacy has been consulted to manage warfarin.   Level: Time INR Clinic  11/26 @1818  4.1 Supratherapeutic  11/27 @ 0519 3.9 Supratherapeutic           Goal of Therapy:  INR 2-3 Monitor  platelets by anticoagulation protocol: Yes   Plan:  Continue to hold warfarin Check INR with morning labs   Barrie Folk, PharmD 08/31/2023 11:13 AM

## 2023-09-01 DIAGNOSIS — L03116 Cellulitis of left lower limb: Secondary | ICD-10-CM | POA: Diagnosis not present

## 2023-09-01 LAB — CBC WITH DIFFERENTIAL/PLATELET
Abs Immature Granulocytes: 0.02 10*3/uL (ref 0.00–0.07)
Basophils Absolute: 0 10*3/uL (ref 0.0–0.1)
Basophils Relative: 1 %
Eosinophils Absolute: 0.3 10*3/uL (ref 0.0–0.5)
Eosinophils Relative: 5 %
HCT: 29.7 % — ABNORMAL LOW (ref 36.0–46.0)
Hemoglobin: 9.8 g/dL — ABNORMAL LOW (ref 12.0–15.0)
Immature Granulocytes: 0 %
Lymphocytes Relative: 29 %
Lymphs Abs: 1.9 10*3/uL (ref 0.7–4.0)
MCH: 29.4 pg (ref 26.0–34.0)
MCHC: 33 g/dL (ref 30.0–36.0)
MCV: 89.2 fL (ref 80.0–100.0)
Monocytes Absolute: 0.5 10*3/uL (ref 0.1–1.0)
Monocytes Relative: 8 %
Neutro Abs: 3.8 10*3/uL (ref 1.7–7.7)
Neutrophils Relative %: 57 %
Platelets: 207 10*3/uL (ref 150–400)
RBC: 3.33 MIL/uL — ABNORMAL LOW (ref 3.87–5.11)
RDW: 12.5 % (ref 11.5–15.5)
WBC: 6.6 10*3/uL (ref 4.0–10.5)
nRBC: 0 % (ref 0.0–0.2)

## 2023-09-01 LAB — BASIC METABOLIC PANEL
Anion gap: 7 (ref 5–15)
BUN: 17 mg/dL (ref 8–23)
CO2: 28 mmol/L (ref 22–32)
Calcium: 9 mg/dL (ref 8.9–10.3)
Chloride: 103 mmol/L (ref 98–111)
Creatinine, Ser: 0.87 mg/dL (ref 0.44–1.00)
GFR, Estimated: >60 mL/min (ref >60)
Glucose, Bld: 99 mg/dL (ref 70–99)
Potassium: 4.2 mmol/L (ref 3.5–5.1)
Sodium: 138 mmol/L (ref 135–145)

## 2023-09-01 LAB — PROTIME-INR
INR: 3.1 — ABNORMAL HIGH (ref 0.8–1.2)
Prothrombin Time: 32.3 s — ABNORMAL HIGH (ref 11.4–15.2)

## 2023-09-01 MED ORDER — WARFARIN SODIUM 3 MG PO TABS
3.0000 mg | ORAL_TABLET | Freq: Once | ORAL | Status: AC
  Administered 2023-09-01: 3 mg via ORAL
  Filled 2023-09-01: qty 1

## 2023-09-01 MED ORDER — WARFARIN - PHARMACIST DOSING INPATIENT: Freq: Every day | Status: DC

## 2023-09-01 NOTE — Progress Notes (Signed)
Progress Note   Patient: Sarah Riddle:[id number] DOB: 06-Dec-1940 DOA: 08/30/2023     2 DOS: the patient was seen and examined on 09/01/2023    Brief hospital course: From HPI "Sarah Riddle is a 82 year female with history of hypertension, history of atrial fibrillation status post ablation, on anticoagulation with warfarin, history of CAD, severe neuropathy, GERD, depression, hyperlipidemia, hereditary hemochromatosis, who presents emergency department for chief concerns of left lower extremity leg wound.    She states that she fell on 08/24/23, and sustained a large skin tear on her left leg.  Patient was seen in the ED on the same day and was treated with wound repair via topical anesthesia and direct pressure with irrigation with sterile saline. Patient had skin repair with Steri-Strips and tissue adhesive, 10 strips were used.  Patient was discharged from the ED.  Patient was advised to follow-up with PCP outpatient in 2 days.    Per patient, patient saw her PCP on Friday, 11/22 and was prescribed antibiotic due to surrounding redness and inflammation noted.  Patient was thought to have cellulitis of the time and was prescribed Augmentin.  Patient endorses compliance with the antibiotic prescribed however the redness worsened prompting her to present to the emergency department today.  "       Assessment and Plan:    Cellulitis of left leg Failed outpatient therapy Status post amoxicillin-clavulanic 500-125 mg outpatient Continue with ceftriaxone 2 g IV daily Continue current pain management Encourage p.o. intake Heart healthy diet ordered Continue wound care management     Neurogenic pain Continue gabapentin 800 mg 4 times daily resumed on admission PDMP reviewed   Depression Continue sertraline 25 mg 3 times daily resumed on admission   CAD (coronary artery disease) Continue home warfarin per pharmacy, Imdur 60 mg daily, rosuvastatin 40 mg nightly, losartan  25 mg daily, metoprolol succinate 25 mg nightly, spironolactone 25 mg daily   Wound cellulitis In setting of history of CAD and failing outpatient therapy Patient's pedal pulses were difficult to palpate ABI results reviewed which is normal in both lower extremities   HLD (hyperlipidemia) Continue statin therapy   Normocytic anemia Appears chronic, recommend continue outpatient follow-up with PCP At baseline on admission Monitor CBC   PAF (paroxysmal atrial fibrillation) (HCC) History of paroxysmal atrial fibrillation status post ablation   Obstructive sleep apnea (adult) (pediatric) Continue CPAP at night   GERD (gastroesophageal reflux disease) Continue PPI therapy   Hypertension Hydralazine 5 mg IV every 6 hours as needed for SBP greater than 170, 4 days ordered   Chart reviewed.    DVT prophylaxis: Warfarin per pharmacy Code Status: Full code Diet: Heart healthy Family Communication: A phone call was offered, patient declined stating that she updated her daughter ready Disposition Plan: Pending clinical course Consults called: Wound nurse, pharmacy Admission status: Telemetry medical, inpatient       Subjective:  Patient admits to improvement and the left lower extremity swelling She admits to the redness increasing to the back of the left leg Denies nausea vomiting chest pain or cough   Physical Exam:   Constitutional: appears frail, age-appropriate, calm Eyes: PERRL, lids and conjunctivae normal ENMT: Mucous membranes are moist.  Respiratory: clear to auscultation bilaterally,  Cardiovascular: Regular rate and rhythm, no murmurs Abdomen: no tenderness, no masses palpated, no hepatosplenomegaly. Bowel sounds positive.  Musculoskeletal: Erythema noted to the left leg with dressing intact and clean Neurologic: Sensation intact. Strength 5/5 in all 4.  Psychiatric: Normal judgment and insight. Alert and oriented x 3. Normal mood.    Family Communication: No  family at bedside  Disposition: Status is: Inpatient   Time spent: 45 minutes   Data Reviewed:    Latest Ref Rng & Units 09/01/2023    3:09 AM 08/31/2023    5:19 AM 08/30/2023   11:47 AM  BMP  Glucose 70 - 99 mg/dL 99  95  91   BUN 8 - 23 mg/dL 17  18  18    Creatinine 0.44 - 1.00 mg/dL 7.82  9.56  2.13   Sodium 135 - 145 mmol/L 138  137  139   Potassium 3.5 - 5.1 mmol/L 4.2  4.1  4.3   Chloride 98 - 111 mmol/L 103  105  107   CO2 22 - 32 mmol/L 28  25  27    Calcium 8.9 - 10.3 mg/dL 9.0  9.1  9.0        Latest Ref Rng & Units 09/01/2023    3:09 AM 08/31/2023    5:19 AM 08/30/2023   11:47 AM  CBC  WBC 4.0 - 10.5 K/uL 6.6  7.1  7.1   Hemoglobin 12.0 - 15.0 g/dL 9.8  08.6  57.8   Hematocrit 36.0 - 46.0 % 29.7  30.8  33.0   Platelets 150 - 400 K/uL 207  220  225     Vitals:   08/31/23 1733 08/31/23 2030 09/01/23 0258 09/01/23 0821  BP: (!) 166/67 (!) 164/68 (!) 93/41 (!) 140/61  Pulse: 68 66 60 (!) 57  Resp: 16 19 16 18   Temp: (!) 97.5 F (36.4 C) 99.1 F (37.3 C) 97.6 F (36.4 C) 98.5 F (36.9 C)  TempSrc: Oral Oral Oral Oral  SpO2: 94% 95% 97% 95%  Weight:      Height:          Author: Loyce Dys, MD 09/01/2023 2:50 PM  For on call review [email or web address].

## 2023-09-01 NOTE — Consult Note (Signed)
PHARMACY - ANTICOAGULATION CONSULT NOTE  Pharmacy Consult for Warfarin Indication: atrial fibrillation  Patient Measurements: Height: 5\' 11"  (180.3 cm) Weight: 86.2 kg (190 lb) IBW/kg (Calculated) : 70.8  Labs: Recent Labs    08/30/23 1147 08/30/23 1818 08/31/23 0519 09/01/23 0309  HGB 10.3*  --  10.1* 9.8*  HCT 33.0*  --  30.8* 29.7*  PLT 225  --  220 207  LABPROT  --  40.2* 38.5* 32.3*  INR  --  4.1* 3.9* 3.1*  CREATININE 0.76  --  0.78 0.87    Estimated Creatinine Clearance: 59.6 mL/min (by C-G formula based on SCr of 0.87 mg/dL).  Medical History: Past Medical History:  Diagnosis Date   A-fib [facility])    Accelerated hypertension 10/31/2022   Age related osteoporosis    Cancer (HCC)    Melanoma x 2 -- Lt Leg -1976 Rt Arm - 2014   Chronic bilateral low back pain    Chronic kidney disease    Complication of anesthesia    DVT (deep venous thrombosis) (HCC) 1960   right leg  was on BC pills   Dysrhythmia    Family history of breast cancer    Family history of colon cancer    Family history of melanoma    Family history of prostate cancer    Fibrocystic breast disease    GERD (gastroesophageal reflux disease)    Headache    migraine   Heart murmur    not heard now.   History of kidney stones    Hyperlipidemia    Hypertension    IBS (irritable bowel syndrome)    Lumbago    Mitral valve prolapse    Neuropathy    Obstructive sleep apnea    Osteoarthritis of shoulder    PONV (postoperative nausea and vomiting)    Medications:  Per PTA took a dose yesterday  Baseline Labs: INR = 4.1  Home dose: Per patient (per MD) takes 3 mg warfarin daily  Per dispense records patient takes 3 mg daily   Assessment: Sarah Riddle is a 82 yo f who presented to the ED with a left leg wound. She has a past medical history of atrial fibrillation s/p ablation, CAD, severe neuropathy, GERD, depression, HDL. Pharmacy has been consulted to manage warfarin.   Level: Time INR A/P  11/26  1818 4.1 Supra; hold  11/27 0519 3.9 Supra; hold  11/28 0309 3.1 Supra; 3 mg   Goal of Therapy:  INR 2-3 Monitor platelets by anticoagulation protocol: Yes   Plan:  Re-start warfarin at home dose of 3 mg. Warfarin has been held x 2 days and INR is trending down towards therapeutic range Check INR with morning labs. If large increase after today's dose, will plan to hold again followed by dose reduction  Tressie Ellis 09/01/2023 7:33 AM

## 2023-09-02 DIAGNOSIS — L03116 Cellulitis of left lower limb: Secondary | ICD-10-CM | POA: Diagnosis not present

## 2023-09-02 LAB — CBC WITH DIFFERENTIAL/PLATELET
Abs Immature Granulocytes: 0.02 10*3/uL (ref 0.00–0.07)
Basophils Absolute: 0.1 10*3/uL (ref 0.0–0.1)
Basophils Relative: 1 %
Eosinophils Absolute: 0.4 10*3/uL (ref 0.0–0.5)
Eosinophils Relative: 7 %
HCT: 31.4 % — ABNORMAL LOW (ref 36.0–46.0)
Hemoglobin: 10.1 g/dL — ABNORMAL LOW (ref 12.0–15.0)
Immature Granulocytes: 0 %
Lymphocytes Relative: 29 %
Lymphs Abs: 1.8 10*3/uL (ref 0.7–4.0)
MCH: 29.6 pg (ref 26.0–34.0)
MCHC: 32.2 g/dL (ref 30.0–36.0)
MCV: 92.1 fL (ref 80.0–100.0)
Monocytes Absolute: 0.5 10*3/uL (ref 0.1–1.0)
Monocytes Relative: 7 %
Neutro Abs: 3.5 10*3/uL (ref 1.7–7.7)
Neutrophils Relative %: 56 %
Platelets: 212 10*3/uL (ref 150–400)
RBC: 3.41 MIL/uL — ABNORMAL LOW (ref 3.87–5.11)
RDW: 12.6 % (ref 11.5–15.5)
WBC: 6.3 10*3/uL (ref 4.0–10.5)
nRBC: 0 % (ref 0.0–0.2)

## 2023-09-02 LAB — BASIC METABOLIC PANEL
Anion gap: 8 (ref 5–15)
BUN: 20 mg/dL (ref 8–23)
CO2: 26 mmol/L (ref 22–32)
Calcium: 9.2 mg/dL (ref 8.9–10.3)
Chloride: 104 mmol/L (ref 98–111)
Creatinine, Ser: 0.83 mg/dL (ref 0.44–1.00)
GFR, Estimated: >60 mL/min (ref >60)
Glucose, Bld: 95 mg/dL (ref 70–99)
Potassium: 4 mmol/L (ref 3.5–5.1)
Sodium: 138 mmol/L (ref 135–145)

## 2023-09-02 LAB — PROTIME-INR
INR: 2.6 — ABNORMAL HIGH (ref 0.8–1.2)
Prothrombin Time: 28 s — ABNORMAL HIGH (ref 11.4–15.2)

## 2023-09-02 MED ORDER — SENNOSIDES-DOCUSATE SODIUM 8.6-50 MG PO TABS: 1.0000 | ORAL_TABLET | Freq: Every evening | ORAL | 0 refills | Status: DC | PRN

## 2023-09-02 MED ORDER — CEPHALEXIN 500 MG PO CAPS: 500.0000 mg | ORAL_CAPSULE | Freq: Four times a day (QID) | ORAL | Status: DC

## 2023-09-02 MED ORDER — WARFARIN SODIUM 3 MG PO TABS
3.0000 mg | ORAL_TABLET | Freq: Every day | ORAL | Status: DC
  Filled 2023-09-02: qty 1

## 2023-09-02 MED ORDER — CEPHALEXIN 500 MG PO CAPS: 500.0000 mg | ORAL_CAPSULE | Freq: Two times a day (BID) | ORAL | 0 refills | Status: AC

## 2023-09-02 NOTE — Consult Note (Signed)
PHARMACY - ANTICOAGULATION CONSULT NOTE  Pharmacy Consult for Warfarin Indication: atrial fibrillation  Patient Measurements: Height: 5\' 11"  (180.3 cm) Weight: 86.2 kg (190 lb) IBW/kg (Calculated) : 70.8  Labs: Recent Labs    08/31/23 0519 09/01/23 0309 09/02/23 0504  HGB 10.1* 9.8* 10.1*  HCT 30.8* 29.7* 31.4*  PLT 220 207 212  LABPROT 38.5* 32.3* 28.0*  INR 3.9* 3.1* 2.6*  CREATININE 0.78 0.87 0.83    Estimated Creatinine Clearance: 62.4 mL/min (by C-G formula based on SCr of 0.83 mg/dL).  Medical History: Past Medical History:  Diagnosis Date   A-fib [facility])    Accelerated hypertension 10/31/2022   Age related osteoporosis    Cancer (HCC)    Melanoma x 2 -- Lt Leg -1976 Rt Arm - 2014   Chronic bilateral low back pain    Chronic kidney disease    Complication of anesthesia    DVT (deep venous thrombosis) (HCC) 1960   right leg  was on BC pills   Dysrhythmia    Family history of breast cancer    Family history of colon cancer    Family history of melanoma    Family history of prostate cancer    Fibrocystic breast disease    GERD (gastroesophageal reflux disease)    Headache    migraine   Heart murmur    not heard now.   History of kidney stones    Hyperlipidemia    Hypertension    IBS (irritable bowel syndrome)    Lumbago    Mitral valve prolapse    Neuropathy    Obstructive sleep apnea    Osteoarthritis of shoulder    PONV (postoperative nausea and vomiting)    Medications:  Per PTA took a dose yesterday  Baseline Labs: INR = 4.1  Home dose: Per patient (per MD) takes 3 mg warfarin daily  Per dispense records patient takes 3 mg daily   Assessment: DM is a 82 yo f who presented to the ED with a left leg wound. She has a past medical history of atrial fibrillation s/p ablation, CAD, severe neuropathy, GERD, depression, HDL. Pharmacy has been consulted to manage warfarin.   Level: Time INR A/P  11/26 1818 4.1 Supra; hold  11/27 0519 3.9 Supra;  hold  11/28 0309 3.1 Supra; 3 mg  11/29 0504 2.6 Thera; 3 mg   Goal of Therapy:  INR 2-3 Monitor platelets by anticoagulation protocol: Yes   Plan:  Continue home warfarin 3 mg Check INR with morning labs CBC per protocol  Tressie Ellis 09/02/2023 7:13 AM

## 2023-09-02 NOTE — TOC Transition Note (Signed)
Transition of Care [facility]) - CM/SW Discharge Note   Patient Details  Name: Sarah Riddle MRN: [id number] Date of Birth: 29-Jun-1941  [facility] Care [facility]) CM/SW Contact:  Margarito Liner, LCSW Phone Number: 09/02/2023, 11:38 AM   Clinical Narrative:  Patient has orders to discharge home today. Readmission prevention screen complete. CSW met with patient. Daughter at bedside. CSW introduced role and explained that discharge planning would be discussed. PCP is Yves Dill, MD. Daughter drives her to appointments. She uses [facility]. No issues obtaining medications. Patient lives home with her husband and daughter. No home health or DME use prior to admission. She does have a rollator and shower seat if needed. No further concerns. Her daughter will transport her home today. CSW signing off.   Final next level of care: Home/Self Care Barriers to Discharge: Barriers Resolved   Patient Goals and CMS Choice      Discharge Placement                  Patient to be transferred to facility by: Daughter Name of family member notified: Mikki Santee Patient and family notified of of transfer: 09/02/23  Discharge Plan and Services Additional resources added to the After Visit Summary for                                       Social Determinants of Health (SDOH) Interventions SDOH Screenings   Food Insecurity: No Food Insecurity (08/30/2023)  Housing: Low Risk  (08/30/2023)  Transportation Needs: No Transportation Needs (08/30/2023)  Utilities: Not At Risk (08/30/2023)  Tobacco Use: Low Risk  (08/30/2023)     Readmission Risk Interventions    09/02/2023   11:38 AM 08/31/2023   12:10 PM 11/01/2022    3:44 PM  Readmission Risk Prevention Plan  Transportation Screening Complete Complete Complete  PCP or Specialist Appt within 3-5 Days Complete  Complete  HRI or Home Care Consult   Not Complete  HRI or Home Care Consult comments   NA  Social Work Consult  for Recovery Care Planning/Counseling Complete Complete Not Complete  SW consult not completed comments   NA  Palliative Care Screening Not Applicable Not Applicable Not Applicable  Medication Review (RN Care Manager) Complete Complete Referral to Pharmacy

## 2023-09-02 NOTE — Progress Notes (Signed)
Discharge instructions were reviewed with patient and family. Questions were encourage and answered. Belongings collected by patient. IV was removed

## 2023-09-02 NOTE — Discharge Summary (Signed)
Physician Discharge Summary   Patient: Sarah Riddle MRN: [id number] DOB: 04/26/1941  Admit date:     08/30/2023  Discharge date: 09/02/23  Discharge Physician: Loyce Dys   PCP: Margaretann Loveless, MD   Recommendations at discharge:  Follow-up with primary care physician in the wound clinic  Discharge Diagnoses: Cellulitis of left leg Failed outpatient therapy Status post amoxicillin-clavulanic 500-125 mg outpatient Continue with ceftriaxone 2 g IV daily Continue current pain management Encourage p.o. intake Heart healthy diet ordered Continue wound care management     Neurogenic pain Continue gabapentin 800 mg 4 times daily resumed on admission PDMP reviewed   Depression Continue sertraline 25 mg 3 times daily resumed on admission   CAD (coronary artery disease) Continue home warfarin per pharmacy, Imdur 60 mg daily, rosuvastatin 40 mg nightly, losartan 25 mg daily, metoprolol succinate 25 mg nightly, spironolactone 25 mg daily   Wound cellulitis In setting of history of CAD and failing outpatient therapy Patient's pedal pulses were difficult to palpate ABI results reviewed which is normal in both lower extremities   HLD (hyperlipidemia) Continue statin therapy   Normocytic anemia Appears chronic, recommend continue outpatient follow-up with PCP At baseline on admission Monitor CBC   PAF (paroxysmal atrial fibrillation) (HCC) History of paroxysmal atrial fibrillation status post ablation   Obstructive sleep apnea (adult) (pediatric) Continue CPAP at night   GERD (gastroesophageal reflux disease) Continue PPI therapy   Hypertension Hydralazine 5 mg IV e   Hospital Course:  Sarah Riddle is a 82 year female with history of hypertension, history of atrial fibrillation status post ablation, on anticoagulation with warfarin, history of CAD, severe neuropathy, GERD, depression, hyperlipidemia, hereditary hemochromatosis, who presents emergency  department for chief concerns of left lower extremity leg wound.    She states that she fell on 08/24/23, and sustained a large skin tear on her left leg.  Patient was seen in the ED on the same day and was treated with wound repair via topical anesthesia and direct pressure with irrigation with sterile saline. Patient had skin repair with Steri-Strips and tissue adhesive, 10 strips were used.  Patient was discharged from the ED.  Patient was advised to follow-up with PCP outpatient in 2 days.    Per patient, patient saw her PCP on Friday, 11/22 and was prescribed antibiotic due to surrounding redness and inflammation noted.  Patient was thought to have cellulitis of the time and was prescribed Augmentin.  Patient endorses compliance with the antibiotic prescribed however the redness worsened prompting her to present to the emergency department.  Patient was subsequently admitted for failed outpatient antibiotic therapy for her cellulitis.  Required IV antibiotics with improvement as well as dressing by wound care.  Patient is doing much better today and therefore being discharged on oral antibiotics and to follow-up with her primary care physician  Consultants: Wound care team Procedures performed: As mentioned above Disposition: Home Diet recommendation:  Cardiac diet DISCHARGE MEDICATION: Allergies as of 09/02/2023       Reactions   Iodinated Contrast Media Shortness Of Breath, Other (See Comments)   Can be premedicated Have been able to tolerate if given premeds (benadryl, prednisone)   Ibuprofen Other (See Comments)   "stomach problems"   Lubiprostone Nausea And Vomiting      Sulfa Antibiotics Other (See Comments)   "Stomach problems" "Stomach problems"  "Stomach problems"  "Stomach problems"   Zolpidem Other (See Comments)   "hallucinations" Other reaction(s): Confusion Other Reaction(s): confusion  Boniva [ibandronic Acid] Diarrhea, Nausea And Vomiting   Celecoxib Other (See  Comments)   GI upset   Doxycycline Diarrhea, Nausea And Vomiting   Duloxetine Other (See Comments)   Over sedation with Zelnorm (IBS med)   Tetracyclines & Related Nausea Only, Nausea And Vomiting        Medication List     STOP taking these medications    amoxicillin-clavulanate 500-125 MG tablet Commonly known as: Augmentin   spironolactone 25 MG tablet Commonly known as: Aldactone       TAKE these medications    acetaminophen 500 MG tablet Commonly known as: TYLENOL Take 500 mg by mouth every 6 (six) hours as needed. Once a day   alendronate 70 MG tablet Commonly known as: FOSAMAX Take 1 tablet (70 mg total) by mouth every Thursday. Take with a full glass of water on an empty stomach. What changed: when to take this   allopurinol 100 MG tablet Commonly known as: ZYLOPRIM Take 100 mg by mouth daily.   azelastine 0.1 % nasal spray Commonly known as: ASTELIN Place 2 sprays into both nostrils at bedtime. Use in each nostril as directed What changed:  when to take this reasons to take this   cephALEXin 500 MG capsule Commonly known as: KEFLEX Take 1 capsule (500 mg total) by mouth 2 (two) times daily for 7 days. Start taking on: September 03, 2023   Cranberry 125 MG Tabs Take 125 mg by mouth daily.   cyanocobalamin 1000 MCG tablet Commonly known as: VITAMIN B12 Take 1,000 mcg by mouth daily. Monday to Friday   EPINEPHrine 0.3 mg/0.3 mL Soaj injection Commonly known as: EPI-PEN Inject 0.3 mg into the muscle as needed.   fluticasone 50 MCG/ACT nasal spray Commonly known as: FLONASE Place 2 sprays into both nostrils daily as needed for allergies or rhinitis.   gabapentin 800 MG tablet Commonly known as: NEURONTIN TAKE 1 TABLET FOUR TIMES DAILY   hydrALAZINE 25 MG tablet Commonly known as: APRESOLINE TAKE 1 TABLET TWICE DAILY   isosorbide mononitrate 60 MG 24 hr tablet Commonly known as: IMDUR TAKE 1 TABLET EVERY DAY What changed: when to take  this   levocetirizine 5 MG tablet Commonly known as: XYZAL Take 1 tablet (5 mg total) by mouth every evening.   losartan 25 MG tablet Commonly known as: COZAAR Take 25 mg by mouth daily.   meclizine 12.5 MG tablet Commonly known as: ANTIVERT Take 1 tablet (12.5 mg total) by mouth 3 (three) times daily as needed for dizziness.   metoprolol succinate 25 MG 24 hr tablet Commonly known as: TOPROL-XL TAKE 1 TABLET EVERY DAY What changed: when to take this   nitroGLYCERIN 0.4 MG SL tablet Commonly known as: NITROSTAT Place 0.4 mg under the tongue every 5 (five) minutes as needed for chest pain.   pantoprazole 40 MG tablet Commonly known as: PROTONIX TAKE 1 TABLET EVERY DAY   rosuvastatin 40 MG tablet Commonly known as: CRESTOR TAKE 1 TABLET EVERY DAY What changed: when to take this   senna-docusate 8.6-50 MG tablet Commonly known as: Senokot-S Take 1 tablet by mouth at bedtime as needed for mild constipation.   sertraline 25 MG tablet Commonly known as: ZOLOFT Take 25 mg by mouth 3 (three) times daily.   Systane Hydration PF 0.4-0.3 % Soln Generic drug: Polyethyl Glyc-Propyl Glyc PF Apply 1 drop to eye daily as needed (dry eyes).   Vitamin D (Ergocalciferol) 50000 units Caps TAKE 1 CAPSULE BY MOUTH ONCE  WEEKLY. What changed: additional instructions   warfarin 3 MG tablet Commonly known as: COUMADIN Take 1 tablet (3 mg total) by mouth daily.        Discharge Exam: Filed Weights   08/30/23 1139  Weight: 86.2 kg   DVT prophylaxis: Warfarin per pharmacy Code Status: Full code Diet: Heart healthy Family Communication: A phone call was offered, patient declined stating that she updated her daughter ready Disposition Plan: Pending clinical course Consults called: Wound nurse, pharmacy Admission status: Telemetry medical, inpatient  Condition at discharge: good  The results of significant diagnostics from this hospitalization (including imaging, microbiology,  ancillary and laboratory) are listed below for reference.   Imaging Studies: US ARTERIAL ABI (SCREENING LOWER EXTREMITY)  Result Date: 08/31/2023 CLINICAL DATA:  Left leg wound and pain status post recent fall and injury. Hypertension. EXAM: NONINVASIVE PHYSIOLOGIC VASCULAR STUDY OF BILATERAL LOWER EXTREMITIES TECHNIQUE: Evaluation of both lower extremities were performed at rest, including calculation of ankle-brachial indices with single level pressure measurements and doppler recording. COMPARISON:  None available. FINDINGS: Right ABI:  1.36 Left ABI:  1.22 Right Lower Extremity:  Normal arterial waveforms at the ankle. Left Lower Extremity:  Normal arterial waveforms at the ankle. 1.0-1.4 Normal IMPRESSION: Normal ankle-brachial indices. Electronically Signed   By: Acquanetta Belling M.D.   On: 08/31/2023 15:25   CT Cervical Spine Wo Contrast  Result Date: 08/24/2023 CLINICAL DATA:  Fall with trauma to the head and neck EXAM: CT CERVICAL SPINE WITHOUT CONTRAST TECHNIQUE: Multidetector CT imaging of the cervical spine was performed without intravenous contrast. Multiplanar CT image reconstructions were also generated. RADIATION DOSE REDUCTION: This exam was performed according to the departmental dose-optimization program which includes automated exposure control, adjustment of the mA and/or kV according to patient size and/or use of iterative reconstruction technique. COMPARISON:  04/28/2022 FINDINGS: Alignment: No traumatic malalignment. Chronic degenerative straightening and kyphosis in the cervical region. Skull base and vertebrae: No evidence of regional fracture or destructive bone lesion. Soft tissues and spinal canal: No traumatic soft tissue finding. Atherosclerotic calcification at the carotid bifurcations. Disc levels: The foramen magnum is widely patent. There is ordinary mild osteoarthritis of the C1-2 articulation but no encroachment upon the neural structures. C2-3: Negative C3-4 through C6-7:  Disc degeneration with endplate osteophytes. No compressive central canal stenosis. Bony foraminal narrowing at C4-5 and C5-6 as seen previously. C7-T1: Minimal noncompressive degenerative changes. Upper chest: Apical pleural and parenchymal scarring. Other: None IMPRESSION: No acute or traumatic finding. Chronic degenerative changes as outlined above. Electronically Signed   By: Paulina Fusi M.D.   On: 08/24/2023 12:03   CT HEAD WO CONTRAST ( )  Result Date: 08/24/2023 CLINICAL DATA:  Fall with trauma to the head and neck EXAM: CT HEAD WITHOUT CONTRAST TECHNIQUE: Contiguous axial images were obtained from the base of the skull through the vertex without intravenous contrast. RADIATION DOSE REDUCTION: This exam was performed according to the departmental dose-optimization program which includes automated exposure control, adjustment of the mA and/or kV according to patient size and/or use of iterative reconstruction technique. COMPARISON:  04/28/2022 FINDINGS: Brain: Mild age related volume loss. No evidence of acute infarction. Old small vessel infarction of the right basal ganglia/internal capsule region. No mass, hemorrhage, hydrocephalus or extra-axial collection. Vascular: There is atherosclerotic calcification of the major vessels at the base of the brain. Skull: Negative Sinuses/Orbits: Clear/normal Other: None IMPRESSION: No acute or traumatic finding. Old small vessel infarction of the right basal ganglia/internal capsule region. Electronically Signed  By: Paulina Fusi M.D.   On: 08/24/2023 12:00   DG Foot Complete Right  Result Date: 08/24/2023 CLINICAL DATA:  Fall.  Pain. EXAM: RIGHT FOOT COMPLETE - 3+ VIEW COMPARISON:  Right foot radiographs dated January 25, 2019. FINDINGS: There is no evidence of acute fracture or dislocation. Joint space narrowing of the first MTP and second through fifth interphalangeal joints. Joint space narrowing, marginal osteophytosis, and subchondral sclerosis of the  second through fifth TMT joints. The TMT joints are anatomically aligned. Calcaneal enthesopathy at the insertion of the Achilles tendon and origin of the central cord of the plantar fascia. Mild dorsal soft tissue swelling. IMPRESSION: 1. No acute osseous abnormality. 2. Moderate midfoot arthritic changes. Electronically Signed   By: Hart Robinsons M.D.   On: 08/24/2023 10:40   DG Tibia/Fibula Right  Result Date: 08/24/2023 CLINICAL DATA:  Fall.  Pain. EXAM: RIGHT TIBIA AND FIBULA - 2 VIEW COMPARISON:  None Available. FINDINGS: No acute osseous abnormality. The visualized knee and ankle joints appear anatomically aligned. No significant focal soft tissue swelling. Vascular calcifications are present. IMPRESSION: No acute osseous abnormality. Electronically Signed   By: Hart Robinsons M.D.   On: 08/24/2023 10:36   DG Foot Complete Left  Result Date: 08/24/2023 CLINICAL DATA:  Fall.  Pain. EXAM: LEFT FOOT - COMPLETE 3+ VIEW COMPARISON:  Left foot radiographs dated January 25, 2019. FINDINGS: No acute fracture or dislocation. Chronic fracture deformity of the second metatarsal. Chronic appearing flattening and sclerosis of the third metatarsal head, which may represent avascular necrosis. Hallux valgus deformity. Advanced degenerative changes the first MTP joint with joint space narrowing and marginal osteophytosis. Diffuse interphalangeal joint space narrowing. The TMT joints appear anatomically aligned progression of advanced degenerative changes. No significant focal soft tissue swelling. IMPRESSION: 1. No acute osseous abnormality. 2. Chronic appearing flattening and sclerosis of third metatarsal head may reflect avascular necrosis. 3. Chronic fracture deformity of the second metatarsal. 4. Progression of advanced arthritic changes throughout the midfoot, which can be seen in the setting neuropathic arthropathy. 5. Osteoarthritis, most pronounced the first MTP joint. Electronically Signed   By:  Hart Robinsons M.D.   On: 08/24/2023 10:35   DG Tibia/Fibula Left  Result Date: 08/24/2023 CLINICAL DATA:  Fall.  Pain. EXAM: LEFT TIBIA AND FIBULA - 2 VIEW COMPARISON:  None Available. FINDINGS: No acute fracture. The visualized knee and ankle joints appear anatomically aligned. Soft tissue swelling of the mid to distal lower extremity. Vascular calcifications are present. IMPRESSION: No acute osseous abnormality. Electronically Signed   By: Hart Robinsons M.D.   On: 08/24/2023 10:29   DG Hip Unilat W or Wo Pelvis 2-3 Views Left  Result Date: 08/24/2023 CLINICAL DATA:  Fall. EXAM: DG HIP (WITH OR WITHOUT PELVIS) 2-3V LEFT COMPARISON:  Left hip radiographs dated Feb 16, 2022 FINDINGS: Status post left total hip arthroplasty. The acetabular and femoral components appear well seated. No acute fracture. The right femoral head is seated within the acetabulum. The sacroiliac joints and pubic symphysis are anatomically aligned with degenerative changes. IMPRESSION: No acute osseous abnormality.  Intact left total hip arthroplasty. Electronically Signed   By: Hart Robinsons M.D.   On: 08/24/2023 10:25   DG Elbow Complete Right  Result Date: 08/24/2023 CLINICAL DATA:  Status post fall with right arm pain EXAM: RIGHT ELBOW - COMPLETE 4 VIEW COMPARISON:  None Available. FINDINGS: Partially imaged postsurgical changes of the right humerus. Intramedullary rod appears intact. No abnormal surrounding lucency. There is no  evidence of fracture, dislocation, or joint effusion. There is no evidence of arthropathy or other focal bone abnormality. Soft tissues are unremarkable. IMPRESSION: 1. No acute fracture or dislocation. 2. Partially imaged postsurgical changes of the right humerus. Electronically Signed   By: Agustin Cree M.D.   On: 08/24/2023 10:24   DG Shoulder Right  Result Date: 08/24/2023 CLINICAL DATA:  Fall.  Pain. EXAM: RIGHT SHOULDER - 2+ VIEW COMPARISON:  Right shoulder radiographs dated June 30, 2022. FINDINGS: Prior ORIF of the right humerus with evidence of interval marginal callus formation. Alignment is relatively unchanged with similar angulation and displaced fracture fragments. Hardware appears intact. No acute osseous abnormality. The glenohumeral joint is anatomically aligned. The acromioclavicular joint is anatomically aligned with mild degenerative changes. IMPRESSION: 1. No acute fracture or dislocation of the right shoulder. 2. Prior ORIF of the right proximal humerus with interval healing and relatively unchanged alignment. Hardware appears intact. Electronically Signed   By: Hart Robinsons M.D.   On: 08/24/2023 10:14    Microbiology: Results for orders placed or performed during the hospital encounter of 01/09/23  Resp panel by RT-PCR (RSV, Flu A&B, Covid) Anterior Nasal Swab     Status: None   Collection Time: 01/09/23  6:00 PM   Specimen: Anterior Nasal Swab  Result Value Ref Range Status   SARS Coronavirus 2 by RT PCR NEGATIVE NEGATIVE Final    Comment: (NOTE) SARS-CoV-2 target nucleic acids are NOT DETECTED.  The SARS-CoV-2 RNA is generally detectable in upper respiratory specimens during the acute phase of infection. The lowest concentration of SARS-CoV-2 viral copies this assay can detect is 138 copies/mL. A negative result does not preclude SARS-Cov-2 infection and should not be used as the sole basis for treatment or other patient management decisions. A negative result may occur with  improper specimen collection/handling, submission of specimen other than nasopharyngeal swab, presence of viral mutation(s) within the areas targeted by this assay, and inadequate number of viral copies(<138 copies/mL). A negative result must be combined with clinical observations, patient history, and epidemiological information. The expected result is Negative.  Fact Sheet for Patients:  BloggerCourse.com  Fact Sheet for Healthcare Providers:   SeriousBroker.it  This test is no t yet approved or cleared by the [place] FDA and  has been authorized for detection and/or diagnosis of SARS-CoV-2 by FDA under an Emergency Use Authorization (EUA). This EUA will remain  in effect (meaning this test can be used) for the duration of the COVID-19 declaration under Section 564(b)(1) of the Act, 21 U.S.C.section 360bbb-3(b)(1), unless the authorization is terminated  or revoked sooner.       Influenza A by PCR NEGATIVE NEGATIVE Final   Influenza B by PCR NEGATIVE NEGATIVE Final    Comment: (NOTE) The Xpert Xpress SARS-CoV-2/FLU/RSV plus assay is intended as an aid in the diagnosis of influenza from Nasopharyngeal swab specimens and should not be used as a sole basis for treatment. Nasal washings and aspirates are unacceptable for Xpert Xpress SARS-CoV-2/FLU/RSV testing.  Fact Sheet for Patients: BloggerCourse.com  Fact Sheet for Healthcare Providers: SeriousBroker.it  This test is not yet approved or cleared by the [place] FDA and has been authorized for detection and/or diagnosis of SARS-CoV-2 by FDA under an Emergency Use Authorization (EUA). This EUA will remain in effect (meaning this test can be used) for the duration of the COVID-19 declaration under Section 564(b)(1) of the Act, 21 U.S.C. section 360bbb-3(b)(1), unless the authorization is terminated or revoked.  Resp Syncytial Virus by PCR NEGATIVE NEGATIVE Final    Comment: (NOTE) Fact Sheet for Patients: BloggerCourse.com  Fact Sheet for Healthcare Providers: SeriousBroker.it  This test is not yet approved or cleared by the [place] FDA and has been authorized for detection and/or diagnosis of SARS-CoV-2 by FDA under an Emergency Use Authorization (EUA). This EUA will remain in effect (meaning this test can be used) for  the duration of the COVID-19 declaration under Section 564(b)(1) of the Act, 21 U.S.C. section 360bbb-3(b)(1), unless the authorization is terminated or revoked.  Performed at [facility], [address] [place], [place] [address]     Labs: CBC: Recent Labs  Lab 08/30/23 1147 08/31/23 0519 09/01/23 0309 09/02/23 0504  WBC 7.1 7.1 6.6 6.3  NEUTROABS 4.4  --  3.8 3.5  HGB 10.3* 10.1* 9.8* 10.1*  HCT 33.0* 30.8* 29.7* 31.4*  MCV 94.6 89.8 89.2 92.1  PLT 225 220 207 212   Basic Metabolic Panel: Recent Labs  Lab 08/30/23 1147 08/31/23 0519 09/01/23 0309 09/02/23 0504  NA 139 137 138 138  K 4.3 4.1 4.2 4.0  CL 107 105 103 104  CO2 27 25 28 26   GLUCOSE 91 95 99 95  BUN 18 18 17 20   CREATININE 0.76 0.78 0.87 0.83  CALCIUM 9.0 9.1 9.0 9.2   Liver Function Tests: Recent Labs  Lab 08/30/23 1147  AST 16  ALT 12  ALKPHOS 61  BILITOT 0.7  PROT 6.3*  ALBUMIN 3.5   CBG: No results for input(s): "GLUCAP" in the last 168 hours.  Discharge time spent:  37 minutes.  Signed: Loyce Dys, MD [facility] 09/02/2023

## 2023-09-05 ENCOUNTER — Encounter: Payer: Self-pay | Admitting: Internal Medicine

## 2023-09-05 ENCOUNTER — Telehealth: Payer: Self-pay | Admitting: *Deleted

## 2023-09-05 ENCOUNTER — Telehealth: Payer: Self-pay

## 2023-09-05 ENCOUNTER — Ambulatory Visit (INDEPENDENT_AMBULATORY_CARE_PROVIDER_SITE_OTHER): Payer: Medicare PPO | Admitting: Internal Medicine

## 2023-09-05 VITALS — BP 120/60 | HR 58 | Ht 71.0 in | Wt 187.8 lb

## 2023-09-05 DIAGNOSIS — E782 Mixed hyperlipidemia: Secondary | ICD-10-CM

## 2023-09-05 DIAGNOSIS — K219 Gastro-esophageal reflux disease without esophagitis: Secondary | ICD-10-CM | POA: Diagnosis not present

## 2023-09-05 DIAGNOSIS — I10 Essential (primary) hypertension: Secondary | ICD-10-CM | POA: Diagnosis not present

## 2023-09-05 DIAGNOSIS — L03116 Cellulitis of left lower limb: Secondary | ICD-10-CM

## 2023-09-05 NOTE — Progress Notes (Signed)
Established Patient Office Visit  Subjective:  Patient ID: Sarah Riddle, female    DOB: [birth year]  Age: 82 y.o. MRN: [id number]  Chief Complaint  Patient presents with   Follow-up    10 days    Patient comes in for her hospital follow-up after being admitted to [facility] from August 30, 2023 to September 02, 2023 for cellulitis of the left lower leg.  Patient had suffered injury to the skin of her left lower leg anteriorly after a fall and developed cellulitis.  She was started on oral Augmentin as an outpatient and a wound clinic referral was sent, but the cellulitis got worse and thus required admission and IV antibiotics.  Patient was sent home on p.o. antibiotics again.  Today she is feeling better and the wound is covered with a dressing.  Patient denies fevers or chills, no nausea vomiting and no diarrhea.  The redness and swelling on the left lower leg has improved, but patient reminded to keep her leg elevated whenever she is sitting down.  She was evaluated by wound specialist while in the hospital and advised about local wound care.    No other concerns at this time.   Past Medical History:  Diagnosis Date   A-fib [facility])    Accelerated hypertension 10/31/2022   Age related osteoporosis    Cancer (HCC)    Melanoma x 2 -- Lt Leg -1976 Rt Arm - 2014   Chronic bilateral low back pain    Chronic kidney disease    Complication of anesthesia    DVT (deep venous thrombosis) (HCC) 1960   right leg  was on BC pills   Dysrhythmia    Family history of breast cancer    Family history of colon cancer    Family history of melanoma    Family history of prostate cancer    Fibrocystic breast disease    GERD (gastroesophageal reflux disease)    Headache    migraine   Heart murmur    not heard now.   History of kidney stones    Hyperlipidemia    Hypertension    IBS (irritable bowel syndrome)    Lumbago    Mitral valve prolapse    Neuropathy    Obstructive sleep apnea     Osteoarthritis of shoulder    PONV (postoperative nausea and vomiting)     Past Surgical History:  Procedure Laterality Date   ABDOMINAL HYSTERECTOMY     BACK SURGERY     BREAST BIOPSY Left 04/20/2023   [place] bx/ heart clip/ path pending   BREAST BIOPSY Left 04/20/2023   [place] LT BREAST BX W LOC DEV 1ST LESION IMG BX SPEC US GUIDE 04/20/2023 [facility]   BREAST CYST ASPIRATION Left 2001   Negative   CARDIAC ELECTROPHYSIOLOGY MAPPING AND ABLATION     EYE SURGERY     HUMERUS IM NAIL Right 05/05/2022   Procedure: INTRAMEDULLARY (IM) NAIL HUMERAL;  Surgeon: Roby Lofts, MD;  Location: [facility] OR;  Service: Orthopedics;  Laterality: Right;   HYSTEROTOMY     L4-L5  2020   titaum rebok cage   LEFT HEART CATH AND CORONARY ANGIOGRAPHY N/A 11/01/2022   Procedure: LEFT HEART CATH AND CORONARY ANGIOGRAPHY and possible PCI and stent;  Surgeon: Laurier Nancy, MD;  Location: [facility] INVASIVE CV LAB;  Service: Cardiovascular;  Laterality: N/A;   TOTAL HIP ARTHROPLASTY Left     Social History   Socioeconomic History   Marital status: Married  Spouse name: Not on file   Number of children: Not on file   Years of education: Not on file   Highest education level: Not on file  Occupational History   Not on file  Tobacco Use   Smoking status: Never   Smokeless tobacco: Never  Vaping Use   Vaping status: Never Used  Substance and Sexual Activity   Alcohol use: No   Drug use: No   Sexual activity: Not Currently  Other Topics Concern   Not on file  Social History Narrative   Not on file   Social Determinants of Health   Financial Resource Strain: Not on file  Food Insecurity: No Food Insecurity (08/30/2023)   Hunger Vital Sign    Worried About Running Out of Food in the Last Year: Never true    Ran Out of Food in the Last Year: Never true  Transportation Needs: No Transportation Needs (08/30/2023)   PRAPARE - Administrator, Civil Service (Medical): No    Lack of  Transportation (Non-Medical): No  Physical Activity: Not on file  Stress: Not on file  Social Connections: Not on file  Intimate Partner Violence: Not At Risk (08/30/2023)   Humiliation, Afraid, Rape, and Kick questionnaire    Fear of Current or Ex-Partner: No    Emotionally Abused: No    Physically Abused: No    Sexually Abused: No    Family History  Problem Relation Age of Onset   Colon cancer Mother 65   Prostate cancer Father        dx 43s   Cancer Sister    Acute myelogenous leukemia Sister        dx 75s   Prostate cancer Brother    Melanoma Brother    Prostate cancer Brother    Melanoma Brother    HIV/AIDS Brother 52   Tuberculosis Paternal Aunt    Melanoma Other    Breast cancer Other    Melanoma Niece     Allergies  Allergen Reactions   Iodinated Contrast Media Shortness Of Breath and Other (See Comments)    Can be premedicated  Have been able to tolerate if given premeds (benadryl, prednisone)   Ibuprofen Other (See Comments)    "stomach problems"    Lubiprostone Nausea And Vomiting        Sulfa Antibiotics Other (See Comments)    "Stomach problems"  "Stomach problems"  "Stomach problems"  "Stomach problems"   Zolpidem Other (See Comments)    "hallucinations"  Other reaction(s): Confusion  Other Reaction(s): confusion   Boniva [Ibandronic Acid] Diarrhea and Nausea And Vomiting   Celecoxib Other (See Comments)    GI upset    Doxycycline Diarrhea and Nausea And Vomiting   Duloxetine Other (See Comments)    Over sedation with Zelnorm (IBS med)   Tetracyclines & Related Nausea Only and Nausea And Vomiting    Outpatient Medications Prior to Visit  Medication Sig   acetaminophen (TYLENOL) 500 MG tablet Take 500 mg by mouth every 6 (six) hours as needed. Once a day   alendronate (FOSAMAX) 70 MG tablet Take 1 tablet (70 mg total) by mouth every Thursday. Take with a full glass of water on an empty stomach. (Patient taking differently: Take 70 mg by  mouth every Tuesday. Take with a full glass of water on an empty stomach.)   allopurinol (ZYLOPRIM) 100 MG tablet Take 100 mg by mouth daily.   azelastine (ASTELIN) 0.1 % nasal spray Place 2  sprays into both nostrils at bedtime. Use in each nostril as directed (Patient taking differently: Place 2 sprays into both nostrils daily as needed for allergies or rhinitis. Use in each nostril as directed)   cephALEXin (KEFLEX) 500 MG capsule Take 1 capsule (500 mg total) by mouth 2 (two) times daily for 7 days.   Cranberry 125 MG TABS Take 125 mg by mouth daily.   cyanocobalamin (VITAMIN B12) 1000 MCG tablet Take 1,000 mcg by mouth daily. Monday to Friday   EPINEPHrine 0.3 mg/0.3 mL IJ SOAJ injection Inject 0.3 mg into the muscle as needed.   fluticasone (FLONASE) 50 MCG/ACT nasal spray Place 2 sprays into both nostrils daily as needed for allergies or rhinitis.   gabapentin (NEURONTIN) 800 MG tablet TAKE 1 TABLET FOUR TIMES DAILY   hydrALAZINE (APRESOLINE) 25 MG tablet TAKE 1 TABLET TWICE DAILY   isosorbide mononitrate (IMDUR) 60 MG 24 hr tablet TAKE 1 TABLET EVERY DAY (Patient taking differently: Take 60 mg by mouth at bedtime.)   levocetirizine (XYZAL) 5 MG tablet Take 1 tablet (5 mg total) by mouth every evening.   losartan (COZAAR) 25 MG tablet Take 25 mg by mouth daily.   meclizine (ANTIVERT) 12.5 MG tablet Take 1 tablet (12.5 mg total) by mouth 3 (three) times daily as needed for dizziness.   metoprolol succinate (TOPROL-XL) 25 MG 24 hr tablet TAKE 1 TABLET EVERY DAY (Patient taking differently: Take 25 mg by mouth at bedtime.)   nitroGLYCERIN (NITROSTAT) 0.4 MG SL tablet Place 0.4 mg under the tongue every 5 (five) minutes as needed for chest pain.   pantoprazole (PROTONIX) 40 MG tablet TAKE 1 TABLET EVERY DAY   Polyethyl Glyc-Propyl Glyc PF (SYSTANE HYDRATION PF) 0.4-0.3 % SOLN Apply 1 drop to eye daily as needed (dry eyes).   rosuvastatin (CRESTOR) 40 MG tablet TAKE 1 TABLET EVERY DAY (Patient  taking differently: Take 40 mg by mouth at bedtime.)   senna-docusate (SENOKOT-S) 8.6-50 MG tablet Take 1 tablet by mouth at bedtime as needed for mild constipation.   sertraline (ZOLOFT) 25 MG tablet Take 25 mg by mouth 3 (three) times daily.   Vitamin D, Ergocalciferol, 50000 units CAPS TAKE 1 CAPSULE BY MOUTH ONCE WEEKLY. (Patient taking differently: Take 1 capsule by mouth once a week. On Friday)   warfarin (COUMADIN) 3 MG tablet Take 1 tablet (3 mg total) by mouth daily.   No facility-administered medications prior to visit.    Review of Systems  Constitutional: Negative.  Negative for chills, fever, malaise/fatigue and weight loss.  HENT: Negative.    Eyes: Negative.   Respiratory: Negative.  Negative for cough and shortness of breath.   Cardiovascular: Negative.  Negative for chest pain, palpitations and leg swelling.  Gastrointestinal: Negative.  Negative for abdominal pain, constipation, diarrhea, heartburn, nausea and vomiting.  Genitourinary: Negative.  Negative for dysuria and flank pain.  Musculoskeletal: Negative.  Negative for joint pain and myalgias.  Skin: Negative.   Neurological: Negative.  Negative for dizziness and headaches.  Endo/Heme/Allergies: Negative.   Psychiatric/Behavioral: Negative.  Negative for depression and suicidal ideas. The patient is not nervous/anxious.        Objective:   BP 120/60   Pulse (!) 58   Ht 5\' 11"  (1.803 m)   Wt 187 lb 12.8 oz (85.2 kg)   SpO2 (!) 58%   BMI 26.19 kg/m   Vitals:   09/05/23 1155  BP: 120/60  Pulse: (!) 58  Height: 5\' 11"  (1.803 m)  Weight:  187 lb 12.8 oz (85.2 kg)  SpO2: (!) 58%  BMI (Calculated): 26.2    Physical Exam Vitals and nursing note reviewed.  Constitutional:      Appearance: Normal appearance.  HENT:     Head: Normocephalic and atraumatic.     Nose: Nose normal.     Mouth/Throat:     Mouth: Mucous membranes are moist.     Pharynx: Oropharynx is clear.  Eyes:     Conjunctiva/sclera:  Conjunctivae normal.     Pupils: Pupils are equal, round, and reactive to light.  Cardiovascular:     Rate and Rhythm: Normal rate and regular rhythm.     Pulses: Normal pulses.     Heart sounds: Normal heart sounds. No murmur heard. Pulmonary:     Effort: Pulmonary effort is normal.     Breath sounds: Normal breath sounds. No wheezing.  Abdominal:     General: Bowel sounds are normal.     Palpations: Abdomen is soft.     Tenderness: There is no abdominal tenderness. There is no right CVA tenderness or left CVA tenderness.  Musculoskeletal:        General: Normal range of motion.     Cervical back: Normal range of motion.     Right lower leg: No edema.     Left lower leg: No edema.  Skin:    General: Skin is warm and dry.  Neurological:     General: No focal deficit present.     Mental Status: She is alert and oriented to person, place, and time.  Psychiatric:        Mood and Affect: Mood normal.        Behavior: Behavior normal.      No results found for any visits on 09/05/23.  Recent Results (from the past 2160 hour(s))  Protime-INR     Status: Abnormal   Collection Time: 06/24/23  1:47 PM  Result Value Ref Range   INR 3.9 (H) 0.9 - 1.2    Comment: Reference interval is for non-anticoagulated patients. Suggested INR therapeutic range for Vitamin K antagonist therapy:    Standard Dose (moderate intensity                   therapeutic range):       2.0 - 3.0    Higher intensity therapeutic range       2.5 - 3.5    Prothrombin Time 39.2 (H) 9.1 - 12.0 sec  Protime-INR     Status: Abnormal   Collection Time: 07/04/23  9:41 AM  Result Value Ref Range   INR 3.2 (H) 0.9 - 1.2    Comment: Reference interval is for non-anticoagulated patients. Suggested INR therapeutic range for Vitamin K antagonist therapy:    Standard Dose (moderate intensity                   therapeutic range):       2.0 - 3.0    Higher intensity therapeutic range       2.5 - 3.5    Prothrombin  Time 32.4 (H) 9.1 - 12.0 sec  Hemoglobin A1c     Status: None   Collection Time: 07/12/23  8:57 AM  Result Value Ref Range   Hgb A1c MFr Bld 5.6 4.8 - 5.6 %    Comment:          Prediabetes: 5.7 - 6.4          Diabetes: >6.4  Glycemic control for adults with diabetes: <7.0    Est. average glucose Bld gHb Est-mCnc 114 mg/dL  TSH     Status: None   Collection Time: 07/12/23  8:57 AM  Result Value Ref Range   TSH 1.010 0.450 - 4.500 uIU/mL  CMP14+EGFR     Status: Abnormal   Collection Time: 07/12/23  8:57 AM  Result Value Ref Range   Glucose 89 70 - 99 mg/dL   BUN 19 8 - 27 mg/dL   Creatinine, Ser 8.29 0.57 - 1.00 mg/dL   eGFR 63 >56 OZ/HYQ/6.57   BUN/Creatinine Ratio 21 12 - 28   Sodium 144 134 - 144 mmol/L   Potassium 5.4 (H) 3.5 - 5.2 mmol/L   Chloride 104 96 - 106 mmol/L   CO2 24 20 - 29 mmol/L   Calcium 9.6 8.7 - 10.3 mg/dL   Total Protein 5.7 (L) 6.0 - 8.5 g/dL   Albumin 3.9 3.7 - 4.7 g/dL   Globulin, Total 1.8 1.5 - 4.5 g/dL   Bilirubin Total 0.3 0.0 - 1.2 mg/dL   Alkaline Phosphatase 65 44 - 121 IU/L   AST 14 0 - 40 IU/L   ALT 8 0 - 32 IU/L  Lipid panel     Status: None   Collection Time: 07/12/23  8:57 AM  Result Value Ref Range   Cholesterol, Total 148 100 - 199 mg/dL   Triglycerides 846 0 - 149 mg/dL   HDL 64 >96 mg/dL   VLDL Cholesterol Cal 19 5 - 40 mg/dL   LDL Chol Calc (NIH) 65 0 - 99 mg/dL   Chol/HDL Ratio 2.3 0.0 - 4.4 ratio    Comment:                                   T. Chol/HDL Ratio                                             Men  Women                               1/2 Avg.Risk  3.4    3.3                                   Avg.Risk  5.0    4.4                                2X Avg.Risk  9.6    7.1                                3X Avg.Risk 23.4   11.0   Protime-INR     Status: Abnormal   Collection Time: 07/27/23 10:00 AM  Result Value Ref Range   INR 4.8 (H) 0.9 - 1.2    Comment: Reference interval is for non-anticoagulated  patients. Suggested INR therapeutic range for Vitamin K antagonist therapy:    Standard Dose (moderate intensity  therapeutic range):       2.0 - 3.0    Higher intensity therapeutic range       2.5 - 3.5    Prothrombin Time 47.5 (H) 9.1 - 12.0 sec  Iron and TIBC     Status: None   Collection Time: 08/05/23 11:37 AM  Result Value Ref Range   Iron 58 28 - 170 ug/dL   TIBC 332 951 - 884 ug/dL   Saturation Ratios 21 10.4 - 31.8 %   UIBC 219 ug/dL    Comment: Performed at [facility], [address] [place], [place] [address]  Ferritin     Status: None   Collection Time: 08/05/23 11:37 AM  Result Value Ref Range   Ferritin 23 11 - 307 ng/mL    Comment: Performed at [facility], [address] [place], [place] [address]  Hepatic function panel     Status: Abnormal   Collection Time: 08/05/23 11:37 AM  Result Value Ref Range   Total Protein 6.2 (L) 6.5 - 8.1 g/dL   Albumin 3.7 3.5 - 5.0 g/dL   AST 15 15 - 41 U/L   ALT 10 0 - 44 U/L   Alkaline Phosphatase 58 38 - 126 U/L   Total Bilirubin 1.0 0.3 - 1.2 mg/dL   Bilirubin, Direct 0.2 0.0 - 0.2 mg/dL   Indirect Bilirubin 0.8 0.3 - 0.9 mg/dL    Comment: Performed at [facility], [address] [place], [place] [address]  CBC with Differential/Platelet     Status: Abnormal   Collection Time: 08/05/23 11:37 AM  Result Value Ref Range   WBC 5.8 4.0 - 10.5 K/uL   RBC 3.54 (L) 3.87 - 5.11 MIL/uL   Hemoglobin 10.6 (L) 12.0 - 15.0 g/dL   HCT 35.5 (L) 73.2 - 20.2 %   MCV 93.5 80.0 - 100.0 fL   MCH 29.9 26.0 - 34.0 pg   MCHC 32.0 30.0 - 36.0 g/dL   RDW 54.2 70.6 - 23.7 %   Platelets 196 150 - 400 K/uL   nRBC 0.0 0.0 - 0.2 %   Neutrophils Relative % 63 %   Neutro Abs 3.6 1.7 - 7.7 K/uL   Lymphocytes Relative 24 %   Lymphs Abs 1.4 0.7 - 4.0 K/uL   Monocytes Relative 7 %   Monocytes Absolute 0.4 0.1 - 1.0 K/uL   Eosinophils Relative 5 %   Eosinophils Absolute 0.3 0.0 - 0.5 K/uL    Basophils Relative 1 %   Basophils Absolute 0.0 0.0 - 0.1 K/uL   Immature Granulocytes 0 %   Abs Immature Granulocytes 0.02 0.00 - 0.07 K/uL    Comment: Performed at [facility], [address] [place], [place] [address]  Folate     Status: None   Collection Time: 08/05/23 11:37 AM  Result Value Ref Range   Folate 22.7 >5.9 ng/mL    Comment: Performed at [facility], [address] [place], [place] [address]  Basic Metabolic Panel - Cancer Center Only     Status: None   Collection Time: 08/05/23 11:37 AM  Result Value Ref Range   Sodium 140 135 - 145 mmol/L   Potassium 3.6 3.5 - 5.1 mmol/L   Chloride 106 98 - 111 mmol/L   CO2 28 22 - 32 mmol/L   Glucose, Bld 93 70 - 99 mg/dL    Comment: Glucose reference range applies only to samples taken after fasting for at least 8 hours.  BUN 13 8 - 23 mg/dL   Creatinine 1.61 0.96 - 1.00 mg/dL   Calcium 9.1 8.9 - 04.5 mg/dL   GFR, Estimated >40 >98 mL/min    Comment: (NOTE) Calculated using the CKD-EPI Creatinine Equation (2021)    Anion gap 6 5 - 15    Comment: Performed at [facility], [address] [place], [place] [address]  Protime-INR     Status: Abnormal   Collection Time: 08/10/23  8:37 AM  Result Value Ref Range   INR 4.5 (H) 0.9 - 1.2    Comment: Reference interval is for non-anticoagulated patients. Suggested INR therapeutic range for Vitamin K antagonist therapy:    Standard Dose (moderate intensity                   therapeutic range):       2.0 - 3.0    Higher intensity therapeutic range       2.5 - 3.5    Prothrombin Time 44.5 (H) 9.1 - 12.0 sec  Protime-INR     Status: Abnormal   Collection Time: 08/15/23  8:58 AM  Result Value Ref Range   INR 1.2 0.9 - 1.2    Comment: Reference interval is for non-anticoagulated patients. Suggested INR therapeutic range for Vitamin K antagonist therapy:    Standard Dose (moderate intensity                   therapeutic range):       2.0 - 3.0     Higher intensity therapeutic range       2.5 - 3.5    Prothrombin Time 13.0 (H) 9.1 - 12.0 sec  Comprehensive metabolic panel     Status: None   Collection Time: 08/24/23 10:25 AM  Result Value Ref Range   Sodium 141 135 - 145 mmol/L   Potassium 3.7 3.5 - 5.1 mmol/L   Chloride 109 98 - 111 mmol/L   CO2 26 22 - 32 mmol/L   Glucose, Bld 98 70 - 99 mg/dL    Comment: Glucose reference range applies only to samples taken after fasting for at least 8 hours.   BUN 14 8 - 23 mg/dL   Creatinine, Ser 7.82 0.44 - 1.00 mg/dL   Calcium 9.4 8.9 - 95.6 mg/dL   Total Protein 6.5 6.5 - 8.1 g/dL   Albumin 3.7 3.5 - 5.0 g/dL   AST 19 15 - 41 U/L   ALT 12 0 - 44 U/L   Alkaline Phosphatase 62 38 - 126 U/L   Total Bilirubin 0.7 <1.2 mg/dL   GFR, Estimated >21 >30 mL/min    Comment: (NOTE) Calculated using the CKD-EPI Creatinine Equation (2021)    Anion gap 6 5 - 15    Comment: Performed at [facility], [address] [place], [place] [address]  Troponin I (High Sensitivity)     Status: None   Collection Time: 08/24/23 10:25 AM  Result Value Ref Range   Troponin I (High Sensitivity) 7 <18 ng/L    Comment: (NOTE) Elevated high sensitivity troponin I (hsTnI) values and significant  changes across serial measurements may suggest ACS but many other  chronic and acute conditions are known to elevate hsTnI results.  Refer to the "Links" section for chest pain algorithms and additional  guidance. Performed at [facility], [address], [place], [place] [address]   CBC with Differential     Status: Abnormal   Collection Time: 08/24/23 10:25 AM  Result Value Ref Range   WBC 5.4 4.0 - 10.5 K/uL   RBC 3.73 (L) 3.87 - 5.11 MIL/uL   Hemoglobin 11.1 (L) 12.0 - 15.0 g/dL   HCT 82.9 (L) 56.2 - 13.0 %   MCV 90.6 80.0 - 100.0 fL   MCH 29.8 26.0 - 34.0 pg   MCHC 32.8 30.0 - 36.0 g/dL   RDW 86.5 78.4 - 69.6 %   Platelets 201 150 - 400 K/uL   nRBC 0.0 0.0 - 0.2 %   Neutrophils  Relative % 65 %   Neutro Abs 3.5 1.7 - 7.7 K/uL   Lymphocytes Relative 23 %   Lymphs Abs 1.2 0.7 - 4.0 K/uL   Monocytes Relative 7 %   Monocytes Absolute 0.4 0.1 - 1.0 K/uL   Eosinophils Relative 4 %   Eosinophils Absolute 0.2 0.0 - 0.5 K/uL   Basophils Relative 1 %   Basophils Absolute 0.0 0.0 - 0.1 K/uL   Immature Granulocytes 0 %   Abs Immature Granulocytes 0.02 0.00 - 0.07 K/uL    Comment: Performed at [facility], [address] [place], [place] [address]  Urinalysis, Routine w reflex microscopic -Urine, Unspecified Source     Status: Abnormal   Collection Time: 08/24/23 10:25 AM  Result Value Ref Range   Color, Urine STRAW (A) YELLOW   APPearance CLEAR (A) CLEAR   Specific Gravity, Urine 1.004 (L) 1.005 - 1.030   pH 8.0 5.0 - 8.0   Glucose, UA NEGATIVE NEGATIVE mg/dL   Hgb urine dipstick NEGATIVE NEGATIVE   Bilirubin Urine NEGATIVE NEGATIVE   Ketones, ur NEGATIVE NEGATIVE mg/dL   Protein, ur NEGATIVE NEGATIVE mg/dL   Nitrite NEGATIVE NEGATIVE   Leukocytes,Ua TRACE (A) NEGATIVE   RBC / HPF 0 0 - 5 RBC/hpf   WBC, UA 0-5 0 - 5 WBC/hpf   Bacteria, UA RARE (A) NONE SEEN   Squamous Epithelial / HPF 0-5 0 - 5 /HPF    Comment: Performed at [facility], [address] [place], [place] [address]  Troponin I (High Sensitivity)     Status: None   Collection Time: 08/24/23 11:49 AM  Result Value Ref Range   Troponin I (High Sensitivity) 7 <18 ng/L    Comment: (NOTE) Elevated high sensitivity troponin I (hsTnI) values and significant  changes across serial measurements may suggest ACS but many other  chronic and acute conditions are known to elevate hsTnI results.  Refer to the "Links" section for chest pain algorithms and additional  guidance. Performed at [facility], [address] [place], [place] [address]   Protime-INR     Status: Abnormal   Collection Time: 08/24/23 11:49 AM  Result Value Ref Range   Prothrombin Time 28.0 (H) 11.4 -  15.2 seconds   INR 2.6 (H) 0.8 - 1.2    Comment: (NOTE) INR goal varies based on device and disease states. Performed at [facility], [address] [place], [place] [address]   Lactic acid, plasma     Status: None   Collection Time: 08/30/23 11:47 AM  Result Value Ref Range   Lactic Acid, Venous 0.7 0.5 - 1.9 mmol/L    Comment: Performed at [facility], [address] [place], [place] [address]  Comprehensive metabolic panel     Status: Abnormal   Collection Time: 08/30/23 11:47 AM  Result Value Ref Range   Sodium 139 135 - 145 mmol/L   Potassium 4.3 3.5 - 5.1 mmol/L  Chloride 107 98 - 111 mmol/L   CO2 27 22 - 32 mmol/L   Glucose, Bld 91 70 - 99 mg/dL    Comment: Glucose reference range applies only to samples taken after fasting for at least 8 hours.   BUN 18 8 - 23 mg/dL   Creatinine, Ser 8.46 0.44 - 1.00 mg/dL   Calcium 9.0 8.9 - 96.2 mg/dL   Total Protein 6.3 (L) 6.5 - 8.1 g/dL   Albumin 3.5 3.5 - 5.0 g/dL   AST 16 15 - 41 U/L   ALT 12 0 - 44 U/L   Alkaline Phosphatase 61 38 - 126 U/L   Total Bilirubin 0.7 <1.2 mg/dL   GFR, Estimated >95 >28 mL/min    Comment: (NOTE) Calculated using the CKD-EPI Creatinine Equation (2021)    Anion gap 5 5 - 15    Comment: Performed at [facility], [address] [place], [place] [address]  CBC with Differential     Status: Abnormal   Collection Time: 08/30/23 11:47 AM  Result Value Ref Range   WBC 7.1 4.0 - 10.5 K/uL   RBC 3.49 (L) 3.87 - 5.11 MIL/uL   Hemoglobin 10.3 (L) 12.0 - 15.0 g/dL   HCT 40.1 (L) 02.7 - 25.3 %   MCV 94.6 80.0 - 100.0 fL   MCH 29.5 26.0 - 34.0 pg   MCHC 31.2 30.0 - 36.0 g/dL   RDW 66.4 40.3 - 47.4 %   Platelets 225 150 - 400 K/uL   nRBC 0.0 0.0 - 0.2 %   Neutrophils Relative % 61 %   Neutro Abs 4.4 1.7 - 7.7 K/uL   Lymphocytes Relative 25 %   Lymphs Abs 1.8 0.7 - 4.0 K/uL   Monocytes Relative 8 %   Monocytes Absolute 0.5 0.1 - 1.0 K/uL   Eosinophils Relative 5 %    Eosinophils Absolute 0.3 0.0 - 0.5 K/uL   Basophils Relative 1 %   Basophils Absolute 0.1 0.0 - 0.1 K/uL   Immature Granulocytes 0 %   Abs Immature Granulocytes 0.02 0.00 - 0.07 K/uL    Comment: Performed at [facility], [address] [place], [place] [address]  Protime-INR     Status: Abnormal   Collection Time: 08/30/23  6:18 PM  Result Value Ref Range   Prothrombin Time 40.2 (H) 11.4 - 15.2 seconds   INR 4.1 (HH) 0.8 - 1.2    Comment: REPEATED TO VERIFY CRITICAL RESULT CALLED TO, READ BACK BY AND VERIFIED WITH: ALEX OLIDEN AT 1915 ON 08/30/23 BY SS (NOTE) INR goal varies based on device and disease states. Performed at [facility], [address] [place], [place] [address]   Basic metabolic panel     Status: None   Collection Time: 08/31/23  5:19 AM  Result Value Ref Range   Sodium 137 135 - 145 mmol/L   Potassium 4.1 3.5 - 5.1 mmol/L   Chloride 105 98 - 111 mmol/L   CO2 25 22 - 32 mmol/L   Glucose, Bld 95 70 - 99 mg/dL    Comment: Glucose reference range applies only to samples taken after fasting for at least 8 hours.   BUN 18 8 - 23 mg/dL   Creatinine, Ser 4.33 0.44 - 1.00 mg/dL   Calcium 9.1 8.9 - 29.5 mg/dL   GFR, Estimated >18 >84 mL/min    Comment: (NOTE) Calculated using the CKD-EPI Creatinine Equation (2021)    Anion gap 7 5 - 15  Comment: Performed at [facility], [address] [place], [place] [address]  CBC     Status: Abnormal   Collection Time: 08/31/23  5:19 AM  Result Value Ref Range   WBC 7.1 4.0 - 10.5 K/uL   RBC 3.43 (L) 3.87 - 5.11 MIL/uL   Hemoglobin 10.1 (L) 12.0 - 15.0 g/dL   HCT 62.9 (L) 52.8 - 41.3 %   MCV 89.8 80.0 - 100.0 fL   MCH 29.4 26.0 - 34.0 pg   MCHC 32.8 30.0 - 36.0 g/dL   RDW 24.4 01.0 - 27.2 %   Platelets 220 150 - 400 K/uL   nRBC 0.0 0.0 - 0.2 %    Comment: Performed at [facility], [address] [place], [place] [address]  Protime-INR     Status: Abnormal   Collection  Time: 08/31/23  5:19 AM  Result Value Ref Range   Prothrombin Time 38.5 (H) 11.4 - 15.2 seconds   INR 3.9 (H) 0.8 - 1.2    Comment: (NOTE) INR goal varies based on device and disease states. Performed at [facility], [address] [place], [place] [address]   Protime-INR     Status: Abnormal   Collection Time: 09/01/23  3:09 AM  Result Value Ref Range   Prothrombin Time 32.3 (H) 11.4 - 15.2 seconds   INR 3.1 (H) 0.8 - 1.2    Comment: (NOTE) INR goal varies based on device and disease states. Performed at [facility], [address] [place], [place] [address]   CBC with Differential/Platelet     Status: Abnormal   Collection Time: 09/01/23  3:09 AM  Result Value Ref Range   WBC 6.6 4.0 - 10.5 K/uL   RBC 3.33 (L) 3.87 - 5.11 MIL/uL   Hemoglobin 9.8 (L) 12.0 - 15.0 g/dL   HCT 63.8 (L) 75.6 - 43.3 %   MCV 89.2 80.0 - 100.0 fL   MCH 29.4 26.0 - 34.0 pg   MCHC 33.0 30.0 - 36.0 g/dL   RDW 29.5 18.8 - 41.6 %   Platelets 207 150 - 400 K/uL   nRBC 0.0 0.0 - 0.2 %   Neutrophils Relative % 57 %   Neutro Abs 3.8 1.7 - 7.7 K/uL   Lymphocytes Relative 29 %   Lymphs Abs 1.9 0.7 - 4.0 K/uL   Monocytes Relative 8 %   Monocytes Absolute 0.5 0.1 - 1.0 K/uL   Eosinophils Relative 5 %   Eosinophils Absolute 0.3 0.0 - 0.5 K/uL   Basophils Relative 1 %   Basophils Absolute 0.0 0.0 - 0.1 K/uL   Immature Granulocytes 0 %   Abs Immature Granulocytes 0.02 0.00 - 0.07 K/uL    Comment: Performed at [facility], [address] [place], [place] [address]  Basic metabolic panel     Status: None   Collection Time: 09/01/23  3:09 AM  Result Value Ref Range   Sodium 138 135 - 145 mmol/L   Potassium 4.2 3.5 - 5.1 mmol/L   Chloride 103 98 - 111 mmol/L   CO2 28 22 - 32 mmol/L   Glucose, Bld 99 70 - 99 mg/dL    Comment: Glucose reference range applies only to samples taken after fasting for at least 8 hours.   BUN 17 8 - 23 mg/dL   Creatinine, Ser 1.60 0.44 - 1.00  mg/dL   Calcium 9.0 8.9 - 10.9 mg/dL   GFR, Estimated >32 >35 mL/min    Comment: (  NOTE) Calculated using the CKD-EPI Creatinine Equation (2021)    Anion gap 7 5 - 15    Comment: Performed at [facility], [address] [place], [place] [address]  Protime-INR     Status: Abnormal   Collection Time: 09/02/23  5:04 AM  Result Value Ref Range   Prothrombin Time 28.0 (H) 11.4 - 15.2 seconds   INR 2.6 (H) 0.8 - 1.2    Comment: (NOTE) INR goal varies based on device and disease states. Performed at [facility], [address] [place], [place] [address]   CBC with Differential/Platelet     Status: Abnormal   Collection Time: 09/02/23  5:04 AM  Result Value Ref Range   WBC 6.3 4.0 - 10.5 K/uL   RBC 3.41 (L) 3.87 - 5.11 MIL/uL   Hemoglobin 10.1 (L) 12.0 - 15.0 g/dL   HCT 09.8 (L) 11.9 - 14.7 %   MCV 92.1 80.0 - 100.0 fL   MCH 29.6 26.0 - 34.0 pg   MCHC 32.2 30.0 - 36.0 g/dL   RDW 82.9 56.2 - 13.0 %   Platelets 212 150 - 400 K/uL   nRBC 0.0 0.0 - 0.2 %   Neutrophils Relative % 56 %   Neutro Abs 3.5 1.7 - 7.7 K/uL   Lymphocytes Relative 29 %   Lymphs Abs 1.8 0.7 - 4.0 K/uL   Monocytes Relative 7 %   Monocytes Absolute 0.5 0.1 - 1.0 K/uL   Eosinophils Relative 7 %   Eosinophils Absolute 0.4 0.0 - 0.5 K/uL   Basophils Relative 1 %   Basophils Absolute 0.1 0.0 - 0.1 K/uL   Immature Granulocytes 0 %   Abs Immature Granulocytes 0.02 0.00 - 0.07 K/uL    Comment: Performed at [facility], [address], [place], [place] [address]  Basic metabolic panel     Status: None   Collection Time: 09/02/23  5:04 AM  Result Value Ref Range   Sodium 138 135 - 145 mmol/L   Potassium 4.0 3.5 - 5.1 mmol/L   Chloride 104 98 - 111 mmol/L   CO2 26 22 - 32 mmol/L   Glucose, Bld 95 70 - 99 mg/dL    Comment: Glucose reference range applies only to samples taken after fasting for at least 8 hours.   BUN 20 8 - 23 mg/dL   Creatinine, Ser 4.69 0.44 - 1.00 mg/dL    Calcium 9.2 8.9 - 62.9 mg/dL   GFR, Estimated >52 >84 mL/min    Comment: (NOTE) Calculated using the CKD-EPI Creatinine Equation (2021)    Anion gap 8 5 - 15    Comment: Performed at [facility], [address] [place], [place] [address]      Assessment & Plan:  Patient advised to continue taking all her medications as such. Continue local wound care. Return for AWV. Problem List Items Addressed This Visit     GERD (gastroesophageal reflux disease)   Essential hypertension, benign   HLD (hyperlipidemia)   Cellulitis of left leg - Primary    Return in about 3 weeks (around 09/26/2023).   Total time spent: 30 minutes  Margaretann Loveless, MD  09/05/2023   This document may have been prepared by [facility] Voice Recognition software and as such may include unintentional dictation errors.

## 2023-09-05 NOTE — Consult Note (Signed)
[facility] Liaison Note  09/05/2023  [name] 03-06-1941 [id number]  Location: RN Hospital Liaison screened the patient remotely at [facility].  Insurance: [facility]   Sarah Riddle is a 82 y.o. female who is a Primary Care Patient of Margaretann Loveless, MD with [facility]. The patient was screened for  readmission hospitalization with noted high risk score for unplanned readmission risk with 1 IP/1 ED in 6 months.  The patient was assessed for potential Care Management service needs for post hospital transition for care coordination. Review of patient's electronic medical record reveals patient was admitted with Cellulitis left leg. Pt discharged home with self care and  supportive daughter documented by TOC. No anticipated needs for VBCI at this time.  Plan: [facility] Liaison will continue to follow progress and disposition to asess for post hospital community care coordination/management needs.  Referral request for community care coordination: anticipate Transitions of Care Team follow up.   VBCI Care Management/Population Health does not replace or interfere with any arrangements made by the Inpatient Transition of Care team.   For questions contact:   Elliot Cousin, RN, [facility] Liaison [place]   [facility], Population Health Office Hours MTWF  8:00 am-6:00 pm Direct Dial: [phone] mobile [phone] [Office toll free line] Office Hours are M-F 8:30 - 5 pm [email or web address]

## 2023-09-05 NOTE — Progress Notes (Signed)
  Care Coordination   Note   09/05/2023 Name: Sarah Riddle MRN: [id number] DOB: [birth year]  Sarah Riddle is a 82 y.o. year old female who sees Margaretann Loveless, MD for primary care. I reached out to Vivia Ewing by phone today to offer care coordination services.  Ms. Mazeika was given information about Care Coordination services today including:   The Care Coordination services include support from the care team which includes your Nurse Coordinator, Clinical Social Worker, or Pharmacist.  The Care Coordination team is here to help remove barriers to the health concerns and goals most important to you. Care Coordination services are voluntary, and the patient may decline or stop services at any time by request to their care team member.   Care Coordination Consent Status: Patient agreed to services and verbal consent obtained.   Follow up plan:  Telephone appointment with care coordination team member scheduled for:  09/12/2023  Encounter Outcome:  Patient Scheduled from referral   Burman Nieves, [facility] Care Coordination Care Guide Direct Dial: [phone]

## 2023-09-05 NOTE — Transitions of Care (Post Inpatient/ED Visit) (Signed)
   09/05/2023  Name: MIKHAIL GEBEL MRN: [id number] DOB: 03/30/1941  Today's TOC FU Call Status: Today's TOC FU Call Status:: Unsuccessful Call (1st Attempt)  Attempted to reach the patient regarding the most recent Inpatient/ED visit. The pateint is currently at her hospital follow up appointment  Follow Up Plan: Additional outreach attempts will be made to reach the patient to complete the Transitions of Care (Post Inpatient/ED visit) call.   Deidre Ala, RN Medical illustrator VBCI-Population Health [phone]

## 2023-09-12 ENCOUNTER — Ambulatory Visit: Payer: Self-pay | Admitting: *Deleted

## 2023-09-12 NOTE — Patient Outreach (Signed)
  Care Coordination   Initial Visit Note   09/12/2023 Name: Sarah Riddle MRN: [id number] DOB: 10-Apr-1941  Sarah Riddle is a 82 y.o. year old female who sees Margaretann Loveless, MD for primary care. I spoke with  Vivia Ewing by phone today.  What matters to the patients health and wellness today?  Patient assessed for MH needs post discharge from the hospital. Patient confirmed having no MH or community resource needs at this time.     Goals Addressed             This Visit's Progress    Care coordination Activities       Activities and task to complete in order to accomplish goals.   EMOTIONAL / MENTAL HEALTH SUPPORT Continue with compliance of taking medication prescribed by Doctor Self Support options  (Continue to seek support from family and self monitor MH)         SDOH assessments and interventions completed:  Yes  SDOH Interventions Today    Flowsheet Row Most Recent Value  SDOH Interventions   Food Insecurity Interventions Intervention Not Indicated  Housing Interventions Intervention Not Indicated  Transportation Interventions Intervention Not Indicated  Utilities Interventions Intervention Not Indicated        Care Coordination Interventions:  Yes, provided  Interventions Today    Flowsheet Row Most Recent Value  Chronic Disease   Chronic disease during today's visit Other  [EMMI Referral, Depression]  General Interventions   General Interventions Discussed/Reviewed General Interventions Discussed  Mental Health Interventions   Mental Health Discussed/Reviewed Mental Health Discussed, Coping Strategies, Depression  [Assessed patient's mood since discharge as well as MH HX, discussed life stressors and confirmed improvement in mood since hospital discharge. Confirmed pt is taking Zoloft - reports that it's beneficial.]  Safety Interventions   Safety Discussed/Reviewed Safety Discussed, Fall Risk  [Encouraged pt to use assistive device while  ambulating to avoid falls.]       Follow up plan: No further intervention required.   Encounter Outcome:  Patient Visit Completed

## 2023-09-12 NOTE — Patient Instructions (Signed)
Visit Information  Thank you for taking time to visit with me today. Please don't hesitate to contact me if I can be of assistance to you.   Following are the goals we discussed today:   Goals Addressed             This Visit's Progress    Care coordination Activities       Activities and task to complete in order to accomplish goals.   EMOTIONAL / MENTAL HEALTH SUPPORT Continue with compliance of taking medication prescribed by Doctor Self Support options  (Continue to seek support from family and self monitor MH)         If you are experiencing a Mental Health or Behavioral Health Crisis or need someone to talk to, please call the Suicide and Crisis Lifeline: 988   Patient verbalizes understanding of instructions and care plan provided today and agrees to view in MyChart. Active MyChart status and patient understanding of how to access instructions and care plan via MyChart confirmed with patient.     No further follow up required: Patient encouraged to call CSW with any future needs.  Minyon Billiter, LCSW [place]  [facility], [facility] Licensed Clinical Social Worker Care Coordinator  Direct Dial: [phone]

## 2023-09-13 ENCOUNTER — Encounter: Payer: Medicare PPO | Attending: Physician Assistant | Admitting: Physician Assistant

## 2023-09-13 DIAGNOSIS — Z7901 Long term (current) use of anticoagulants: Secondary | ICD-10-CM | POA: Diagnosis not present

## 2023-09-13 DIAGNOSIS — I2510 Atherosclerotic heart disease of native coronary artery without angina pectoris: Secondary | ICD-10-CM | POA: Insufficient documentation

## 2023-09-13 DIAGNOSIS — I87332 Chronic venous hypertension (idiopathic) with ulcer and inflammation of left lower extremity: Secondary | ICD-10-CM | POA: Insufficient documentation

## 2023-09-13 DIAGNOSIS — I872 Venous insufficiency (chronic) (peripheral): Secondary | ICD-10-CM | POA: Diagnosis not present

## 2023-09-13 DIAGNOSIS — G603 Idiopathic progressive neuropathy: Secondary | ICD-10-CM | POA: Insufficient documentation

## 2023-09-13 DIAGNOSIS — L97822 Non-pressure chronic ulcer of other part of left lower leg with fat layer exposed: Secondary | ICD-10-CM | POA: Insufficient documentation

## 2023-09-13 DIAGNOSIS — I480 Paroxysmal atrial fibrillation: Secondary | ICD-10-CM | POA: Insufficient documentation

## 2023-09-14 DIAGNOSIS — M3501 Sicca syndrome with keratoconjunctivitis: Secondary | ICD-10-CM | POA: Diagnosis not present

## 2023-09-14 DIAGNOSIS — H43813 Vitreous degeneration, bilateral: Secondary | ICD-10-CM | POA: Diagnosis not present

## 2023-09-14 DIAGNOSIS — H35372 Puckering of macula, left eye: Secondary | ICD-10-CM | POA: Diagnosis not present

## 2023-09-15 NOTE — Progress Notes (Signed)
CELISA, ORTLOFF ([id number]) [id number] Page 1 of 10 Visit Report for 09/13/2023 Chief Complaint Document Details Patient Name: Date of Service: Sarah Riddle, Sarah [facility] M. 09/13/2023 2:00 PM Medical Record Number: [id number] Patient Account Number: [id number] Date of Birth/Sex: Treating RN: 1941/07/23 (82 y.o. Female) Midge Aver Primary Care Provider: Yves Dill Other Clinician: Referring Provider: Treating Provider/Extender: Luan Moore, Neelam Weeks in Treatment: 0 Information Obtained from: Patient Chief Complaint Left LE Ulcer Electronic Signature(s) Signed: 09/13/2023 3:27:24 PM By: Allen Derry PA-C Entered By: Allen Derry on 09/13/2023 15:27:24 -------------------------------------------------------------------------------- Debridement Details Patient Name: Date of Service: Sarah Riddle, Sarah Sarah THY M. 09/13/2023 2:00 PM Medical Record Number: [id number] Patient Account Number: [id number] Date of Birth/Sex: Treating RN: 07-Aug-1941 (82 y.o. Female) Midge Aver Primary Care Provider: Yves Dill Other Clinician: Referring Provider: Treating Provider/Extender: Luan Moore, Neelam Weeks in Treatment: 0 Debridement Performed for Assessment: Wound #1 Left,Anterior Lower Leg Performed By: Physician Allen Derry, PA-C The following information was scribed by: Midge Aver The information was scribed for: Allen Derry Debridement Type: Debridement Severity of Tissue Pre Debridement: Fat layer exposed Level of Consciousness (Pre-procedure): Awake and Alert Pre-procedure Verification/Time Out Yes - 15:34 Taken: Start Time: 15:34 Pain Control: Lidocaine 4% T opical Solution Percent of Wound Bed Debrided: 100% T Area Debrided (cm): otal 21.9 Tissue and other material debrided: Viable, Non-Viable, Slough, Subcutaneous, Slough Level: Skin/Subcutaneous Tissue Debridement Description: Excisional Instrument: Curette Bleeding: Minimum Sarah Riddle, Sarah Riddle ([id number]) [id number] Page 2 of 10 Hemostasis Achieved: Pressure Procedural Pain: 0 Post Procedural Pain: 0 Response to Treatment: Procedure was tolerated well Level of Consciousness (Post- Awake and Alert procedure): Post Debridement Measurements of Total Wound Length: (cm) 6.2 Width: (cm) 4.5 Depth: (cm) 0.1 Volume: (cm) 2.191 Character of Wound/Ulcer Post Debridement: Stable Severity of Tissue Post Debridement: Fat layer exposed Post Procedure Diagnosis Same as Pre-procedure Electronic Signature(s) Signed: 09/13/2023 4:50:41 PM By: Midge Aver MSN RN CNS WTA Signed: 09/14/2023 7:54:12 PM By: Allen Derry PA-C Entered By: Midge Aver on 09/13/2023 15:35:13 -------------------------------------------------------------------------------- HPI Details Patient Name: Date of Service: Sarah Riddle, Sarah Sarah THY M. 09/13/2023 2:00 PM Medical Record Number: [id number] Patient Account Number: [id number] Date of Birth/Sex: Treating RN: 08-07-1941 (82 y.o. Female) Midge Aver Primary Care Provider: Yves Dill Other Clinician: Referring Provider: Treating Provider/Extender: Luan Moore, Neelam Weeks in Treatment: 0 History of Present Illness Chronic/Inactive Conditions Condition 1: 09-13-2023 upon evaluation today patient's ABIs on the right 1.36 on the left 1.22 and this was performed on August 31, 2023 when she was in the hospital. She seems to have good arterial flow to be able to heal the wound. HPI Description: 09-13-2023 upon evaluation today patient appears to be doing poorly currently in regard to her wound which is on the left anterior lower extremity. This is a wound that was acquired on August 24, 2023. With that being said the patient had this skin tear that occurred as a result of trauma and then subsequently was in the hospital from November 20 until November 29. Xeroform has been used along with border foam dressings. She is on  Coumadin. With that being said she did have ABIs done and they are noted above. Patient does have a history of chronic venous hypertension, neuropathy, atrial fibrillation, coronary artery disease, and she is on long-term anticoagulant therapy specifically Coumadin. Electronic Signature(s) Signed: 09/14/2023 6:53:12 PM By: Allen Derry PA-C Previous Signature: 09/13/2023 5:29:51 PM Version By: Allen Derry PA-C Entered By: Allen Derry on 09/14/2023 18:53:12 Riddle,  Sarah Churn ([id number]) [id number] Page 3 of 10 -------------------------------------------------------------------------------- Physical Exam Details Patient Name: Date of Service: Sarah Riddle, Sarah Sarah Riddle M. 09/13/2023 2:00 PM Medical Record Number: [id number] Patient Account Number: [id number] Date of Birth/Sex: Treating RN: 08-27-1941 (82 y.o. Female) Midge Aver Primary Care Provider: Yves Dill Other Clinician: Referring Provider: Treating Provider/Extender: Luan Moore, Neelam Weeks in Treatment: 0 Constitutional patient is hypertensive.. pulse regular and within target range for patient.Marland Kitchen respirations regular, non-labored and within target range for patient.Marland Kitchen temperature within target range for patient.. Well-nourished and well-hydrated in no acute distress. Eyes conjunctiva clear no eyelid edema noted. pupils equal round and reactive to light and accommodation. Ears, Nose, Mouth, and Throat no gross abnormality of ear auricles or external auditory canals. normal hearing noted during conversation. mucus membranes moist. Respiratory normal breathing without difficulty. Cardiovascular 2+ dorsalis pedis/posterior tibialis pulses. no clubbing, cyanosis, significant edema, <3 sec cap refill. Musculoskeletal Patient unable to walk without assistance. no significant deformity or arthritic changes, no loss or range of motion, no clubbing. Psychiatric this patient is able to make decisions and  demonstrates good insight into disease process. Alert and Oriented x 3. pleasant and cooperative. Notes Upon inspection patient's wound bed actually showed signs of some need for debridement today and I did perform debridement to clear away necrotic debris she tolerated this without complication there was some bleeding postdebridement I did try to control the bleeding is much as possible and we were able to achieve hemostasis. Electronic Signature(s) Signed: 09/14/2023 6:54:12 PM By: Allen Derry PA-C Entered By: Allen Derry on 09/14/2023 18:54:12 -------------------------------------------------------------------------------- Physician Orders Details Patient Name: Date of Service: Sarah Caprice Renshaw, Sarah Sarah THY M. 09/13/2023 2:00 PM Medical Record Number: [id number] Patient Account Number: [id number] Date of Birth/Sex: Treating RN: 13-Oct-1940 (82 y.o. Female) Midge Aver Primary Care Provider: Yves Dill Other Clinician: Referring Provider: Treating Provider/Extender: Luan Moore, Neelam Weeks in Treatment: 0 The following information was scribed by: Midge Aver The information was scribed for: Nguyen, Salaiz, Sarah Churn ([id number]) [id number] Page 4 of 10 Verbal / Phone Orders: No Diagnosis Coding ICD-10 Coding Code Description [phone] Chronic venous hypertension (idiopathic) with ulcer and inflammation of left lower extremity L97.822 Non-pressure chronic ulcer of other part of left lower leg with fat layer exposed G60.3 Idiopathic progressive neuropathy I48.0 Paroxysmal atrial fibrillation I25.10 Atherosclerotic heart disease of native coronary artery without angina pectoris Z79.01 Long term (current) use of anticoagulants Follow-up Appointments Return Appointment in 1 week. Nurse Visit as needed Bathing/ Shower/ Hygiene May shower with wound dressing protected with water repellent cover or cast protector. No tub bath. Anesthetic (Use 'Patient  Medications' Section for Anesthetic Order Entry) Lidocaine applied to wound bed Edema Control - Orders / Instructions Optional: One layer of unna paste to top of compression wrap (to act as an anchor). UrgoK2 LITE Elevate, Exercise Daily and A void Standing for Long Periods of Time. Elevate legs to the level of the heart and pump ankles as often as possible Elevate leg(s) parallel to the floor when sitting. Wound Treatment Wound #1 - Lower Leg Wound Laterality: Left, Anterior Cleanser: Soap and Water 1 x Per Week/15 Days Discharge Instructions: Gently cleanse wound with antibacterial soap, rinse and pat dry prior to dressing wounds Cleanser: Vashe 5.8 (oz) 1 x Per Week/15 Days Discharge Instructions: Use vashe 5.8 (oz) as directed Prim Dressing: Promogran Matrix 4.34 (in) 1 x Per Week/15 Days ary Discharge Instructions: Moisten w/normal saline or sterile water; Cover wound as directed. Sarah  not remove from wound bed. Prim Dressing: Xeroform 4x4-HBD (in/in) 1 x Per Week/15 Days ary Discharge Instructions: Apply Xeroform 4x4-HBD (in/in) as directed Secondary Dressing: Zetuvit Plus 4x8 (in/in) 1 x Per Week/15 Days Compression Wrap: Urgo K2 Lite, two layer compression system, regular 1 x Per Week/15 Days Electronic Signature(s) Signed: 09/13/2023 4:50:41 PM By: Midge Aver MSN RN CNS WTA Signed: 09/14/2023 7:54:12 PM By: Allen Derry PA-C Entered By: Midge Aver on 09/13/2023 16:25:41 -------------------------------------------------------------------------------- Problem List Details Patient Name: Date of Service: Sarah Riddle, Sarah Sarah THY M. 09/13/2023 2:00 PM Medical Record Number: [id number] Patient Account Number: [id number] Date of Birth/Sex: Treating RN: 1941-05-10 (82 y.o. Female) Midge Aver Primary Care Provider: Yves Dill Other Clinician: KRISANN, Sarah Riddle ([id number]) [id number] Page 5 of 10 Referring Provider: Treating Provider/Extender:  Luan Moore, Neelam Weeks in Treatment: 0 Active Problems ICD-10 Encounter Code Description Active Date MDM Diagnosis I87.332 Chronic venous hypertension (idiopathic) with ulcer and inflammation of left 09/13/2023 No Yes lower extremity L97.822 Non-pressure chronic ulcer of other part of left lower leg with fat layer exposed12/07/2023 No Yes G60.3 Idiopathic progressive neuropathy 09/13/2023 No Yes I48.0 Paroxysmal atrial fibrillation 09/13/2023 No Yes I25.10 Atherosclerotic heart disease of native coronary artery without angina pectoris 09/13/2023 No Yes Z79.01 Long term (current) use of anticoagulants 09/13/2023 No Yes Inactive Problems Resolved Problems Electronic Signature(s) Signed: 09/13/2023 4:29:24 PM By: Midge Aver MSN RN CNS WTA Signed: 09/14/2023 7:54:12 PM By: Allen Derry PA-C Previous Signature: 09/13/2023 3:27:10 PM Version By: Allen Derry PA-C Entered By: Midge Aver on 09/13/2023 16:29:24 -------------------------------------------------------------------------------- Progress Note Details Patient Name: Date of Service: Sarah Caprice Renshaw, Sarah Sarah THY M. 09/13/2023 2:00 PM Medical Record Number: [id number] Patient Account Number: [id number] Date of Birth/Sex: Treating RN: [birth year] (82 y.o. Female) Midge Aver Primary Care Provider: Yves Dill Other Clinician: Referring Provider: Treating Provider/Extender: Luan Moore, Neelam Weeks in Treatment: 0 Subjective Chief Complaint Information obtained from Patient Left LE Ulcer History of Present Illness (HPI) Sarah Riddle, Sarah Riddle ([id number]) [id number] Page 6 of 10 Chronic/Inactive Condition: 09-13-2023 upon evaluation today patient's ABIs on the right 1.36 on the left 1.22 and this was performed on August 31, 2023 when she was in the hospital. She seems to have good arterial flow to be able to heal the wound. 09-13-2023 upon evaluation today patient appears to be doing poorly  currently in regard to her wound which is on the left anterior lower extremity. This is a wound that was acquired on August 24, 2023. With that being said the patient had this skin tear that occurred as a result of trauma and then subsequently was in the hospital from November 20 until November 29. Xeroform has been used along with border foam dressings. She is on Coumadin. With that being said she did have ABIs done and they are noted above. Patient does have a history of chronic venous hypertension, neuropathy, atrial fibrillation, coronary artery disease, and she is on long-term anticoagulant therapy specifically Coumadin. Patient History Information obtained from Patient. Allergies Ambien, Cymbalta, sulfur, Zelnorm, ibuprofen, Boniva, Celebrex, Amitiza, dye (Reaction: IVP Dye) Social History Never smoker, Marital Status - Married, Alcohol Use - Never, Drug Use - No History, Caffeine Use - Moderate. Medical History Respiratory Patient has history of Sleep Apnea - CPAP and O2 Cardiovascular Patient has history of Coronary Artery Disease, Hypertension Neurologic Patient has history of Neuropathy Medical A Surgical History Notes nd Cardiovascular Afib Review of Systems (ROS) Constitutional Symptoms (General Health) Denies complaints or symptoms of Fatigue, Fever, Chills,  Marked Weight Change. Eyes Complains or has symptoms of Glasses / Contacts. Ear/Nose/Mouth/Throat Denies complaints or symptoms of Difficult clearing ears, Sinusitis. Hematologic/Lymphatic Denies complaints or symptoms of Bleeding / Clotting Disorders, Human Immunodeficiency Virus. Gastrointestinal Denies complaints or symptoms of Frequent diarrhea, Nausea, Vomiting. Endocrine Denies complaints or symptoms of Hepatitis, Thyroid disease, Polydypsia (Excessive Thirst). Immunological Denies complaints or symptoms of Hives, Itching. Integumentary (Skin) Denies complaints or symptoms of Wounds, Bleeding or  bruising tendency, Breakdown, Swelling. Musculoskeletal Denies complaints or symptoms of Muscle Pain, Muscle Weakness. Psychiatric Denies complaints or symptoms of Anxiety, Claustrophobia. Objective Constitutional patient is hypertensive.. pulse regular and within target range for patient.Marland Kitchen respirations regular, non-labored and within target range for patient.Marland Kitchen temperature within target range for patient.. Well-nourished and well-hydrated in no acute distress. Vitals Time Taken: 2:30 PM, Height: 71 in, Source: Stated, Weight: 190 lbs, Source: Stated, BMI: 26.5, Temperature: 98.2 F, Pulse: 61 bpm, Respiratory Rate: 18 breaths/min, Blood Pressure: 163/57 mmHg. Eyes conjunctiva clear no eyelid edema noted. pupils equal round and reactive to light and accommodation. Ears, Nose, Mouth, and Throat no gross abnormality of ear auricles or external auditory canals. normal hearing noted during conversation. mucus membranes moist. Respiratory normal breathing without difficulty. Cardiovascular 2+ dorsalis pedis/posterior tibialis pulses. no clubbing, cyanosis, significant edema, Musculoskeletal Patient unable to walk without assistance. no significant deformity or arthritic changes, no loss or range of motion, no clubbing. Psychiatric this patient is able to make decisions and demonstrates good insight into disease process. Alert and Oriented x 3. pleasant and cooperative. Sarah Riddle, Sarah Riddle ([id number]) [id number] Page 7 of 10 General Notes: Upon inspection patient's wound bed actually showed signs of some need for debridement today and I did perform debridement to clear away necrotic debris she tolerated this without complication there was some bleeding postdebridement I did try to control the bleeding is much as possible and we were able to achieve hemostasis. Integumentary (Hair, Skin) Wound #1 status is Open. Original cause of wound was Trauma. The date acquired was:  08/24/2023. The wound is located on the Left,Anterior Lower Leg. The wound measures 6.2cm length x 4.5cm width x 0.1cm depth; 21.913cm^2 area and 2.191cm^3 volume. There is Fat Layer (Subcutaneous Tissue) exposed. There is no tunneling or undermining noted. There is a medium amount of serosanguineous drainage noted. The wound margin is distinct with the outline attached to the wound base. There is medium (34-66%) red, pink granulation within the wound bed. There is a small (1-33%) amount of necrotic tissue within the wound bed including Eschar. 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 There was a Excisional Skin/Subcutaneous Tissue Debridement with a total area of 21.9 sq cm performed by Allen Derry, PA-C. With the following instrument(s): Curette to remove Viable and Non-Viable tissue/material. Material removed includes Subcutaneous Tissue and Slough and after achieving pain control using Lidocaine 4% T opical Solution. No specimens were taken. A time out was conducted at 15:34, prior to the start of the procedure. A Minimum amount of bleeding was controlled with Pressure. The procedure was tolerated well with a pain level of 0  throughout and a pain level of 0 following the procedure. Post Debridement Measurements: 6.2cm length x 4.5cm width x 0.1cm depth; 2.191cm^3 volume. Character of Wound/Ulcer Post Debridement is stable. Severity of Tissue Post Debridement is: Fat  layer exposed. Post procedure Diagnosis Wound #1: Same as Pre-Procedure Pre-procedure diagnosis of Wound #1 is a Venous Leg Ulcer located on the Left,Anterior Lower Leg . There was a Three Layer Compression Therapy Procedure by Midge Aver, RN. Post procedure Diagnosis Wound #1: Same as Pre-Procedure Plan Follow-up Appointments: Return Appointment in 1 week. Nurse Visit as needed Bathing/ Shower/ Hygiene: May shower with wound dressing protected with water repellent cover or cast protector. No tub bath. Anesthetic (Use 'Patient Medications' Section for Anesthetic Order Entry): Lidocaine applied to wound bed Edema Control - Orders / Instructions: Optional: One layer of unna paste to top of compression wrap (to act as an anchor). UrgoK2 LITE Elevate, Exercise Daily and Avoid Standing for Long Periods of Time. Elevate legs to the level of the heart and pump ankles as often as possible Elevate leg(s) parallel to the floor when sitting. WOUND #1: - Lower Leg Wound Laterality: Left, Anterior Cleanser: Soap and Water 1 x Per Week/15 Days Discharge Instructions: Gently cleanse wound with antibacterial soap, rinse and pat dry prior to dressing wounds Cleanser: Vashe 5.8 (oz) 1 x Per Week/15 Days Discharge Instructions: Use vashe 5.8 (oz) as directed Prim Dressing: Promogran Matrix 4.34 (in) 1 x Per Week/15 Days ary Discharge Instructions: Moisten w/normal saline or sterile water; Cover wound as directed. Sarah not remove from wound bed. Prim Dressing: Xeroform 4x4-HBD (in/in) 1 x Per Week/15 Days ary Discharge Instructions: Apply Xeroform 4x4-HBD (in/in) as directed Secondary Dressing: Zetuvit Plus 4x8 (in/in) 1 x Per Week/15 Days Com pression Wrap: Urgo K2 Lite, two layer compression system, regular 1 x Per Week/15 Days 1. Based on what I am seeing I am going to recommend at this time that we actually have the patient go ahead and continue to monitor for any signs of infection or  worsening. I Sarah believe that she is doing well currently with the wound I think we will continue Xeroform for now just due to the fact that it was very Sarah Riddle, Sarah Riddle ([id number]) [id number] Page 8 of 10 dry. She is in agreement with the plan. 2. I am going to recommend as well that the patient should continue to use compression and I recommend the Urgo K2 lite compression wrap. 3. we can use a Zetuvit over top of the Xeroform. We will see patient back for reevaluation in 1 week here in the clinic. If anything worsens or changes patient will contact our office for additional recommendations. Electronic Signature(s) Signed: 09/14/2023 6:57:23 PM By: Allen Derry PA-C Entered By: Allen Derry on 09/14/2023 18:57:23 -------------------------------------------------------------------------------- ROS/PFSH Details Patient Name: Date of Service: Sarah Caprice Renshaw, Sarah Sarah THY M. 09/13/2023 2:00 PM Medical Record Number: [id number] Patient Account Number: [id number] Date of Birth/Sex: Treating RN: 1941-02-17 (82 y.o. Female) Midge Aver Primary Care Provider: Yves Dill Other Clinician: Referring Provider: Treating Provider/Extender: Luan Moore, Neelam Weeks in Treatment: 0 Information Obtained From Patient Constitutional Symptoms (General Health) Complaints and Symptoms: Negative for: Fatigue; Fever; Chills; Marked Weight Change Eyes Complaints and Symptoms: Positive for: Glasses / Contacts Ear/Nose/Mouth/Throat Complaints and Symptoms: Negative for: Difficult clearing ears; Sinusitis Hematologic/Lymphatic Complaints and Symptoms: Negative for: Bleeding / Clotting Disorders; Human Immunodeficiency Virus Gastrointestinal Complaints and Symptoms: Negative for: Frequent diarrhea; Nausea; Vomiting Endocrine Complaints and Symptoms: Negative for: Hepatitis;  Thyroid disease; Polydypsia (Excessive Thirst) Immunological Complaints and Symptoms: Negative for:  Hives; Itching Integumentary (Skin) Complaints and Symptoms: Negative for: Wounds; Bleeding or bruising tendency; Breakdown; Swelling Sarah Riddle, Sarah Riddle ([id number]) [id number] Page 9 of 10 Musculoskeletal Complaints and Symptoms: Negative for: Muscle Pain; Muscle Weakness Psychiatric Complaints and Symptoms: Negative for: Anxiety; Claustrophobia Respiratory Medical History: Positive for: Sleep Apnea - CPAP and O2 Cardiovascular Medical History: Positive for: Coronary Artery Disease; Hypertension Past Medical History Notes: Afib Neurologic Medical History: Positive for: Neuropathy Oncologic Immunizations Pneumococcal Vaccine: Received Pneumococcal Vaccination: Yes Received Pneumococcal Vaccination On or After 60th Birthday: Yes Implantable Devices None Family and Social History Never smoker; Marital Status - Married; Alcohol Use: Never; Drug Use: No History; Caffeine Use: Moderate Electronic Signature(s) Signed: 09/13/2023 4:50:41 PM By: Midge Aver MSN RN CNS WTA Signed: 09/14/2023 7:54:12 PM By: Allen Derry PA-C Entered By: Midge Aver on 09/13/2023 14:48:39 -------------------------------------------------------------------------------- SuperBill Details Patient Name: Date of Service: Sarah Riddle, Sarah Sarah THY M. 09/13/2023 Medical Record Number: [id number] Patient Account Number: [id number] Date of Birth/Sex: Treating RN: 1941/01/05 (82 y.o. Female) Midge Aver Primary Care Provider: Yves Dill Other Clinician: Referring Provider: Treating Provider/Extender: Luan Moore, Neelam Weeks in Treatment: 0 Diagnosis Coding ICD-10 Codes Code Description [phone] Chronic venous hypertension (idiopathic) with ulcer and inflammation of left lower extremity KARRYN, WITHEE ([id number]) [id number] Page 10 of 10 [phone] Non-pressure chronic ulcer of other part of left lower leg with fat layer exposed G60.3 Idiopathic  progressive neuropathy I48.0 Paroxysmal atrial fibrillation I25.10 Atherosclerotic heart disease of native coronary artery without angina pectoris Z79.01 Long term (current) use of anticoagulants Facility Procedures : CPT4 Code: [id number] Description: 99213 - WOUND CARE VISIT-LEV 3 EST PT Modifier: Quantity: 1 : CPT4 Code: [id number] Description: 11042 - DEB SUBQ TISSUE 20 SQ CM/< ICD-10 Diagnosis Description L97.822 Non-pressure chronic ulcer of other part of left lower leg with fat layer exposed Modifier: Quantity: 1 : CPT4 Code: [id number] Description: 11045 - DEB SUBQ TISS EA ADDL 20CM ICD-10 Diagnosis Description L97.822 Non-pressure chronic ulcer of other part of left lower leg with fat layer exposed Modifier: Quantity: 1 Physician Procedures : CPT4 Code Description Modifier [id number] WC PHYS LEVEL 3 NEW PT 25 ICD-10 Diagnosis Description I87.332 Chronic venous hypertension (idiopathic) with ulcer and inflammation of left lower extremity 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

## 2023-09-16 ENCOUNTER — Other Ambulatory Visit: Payer: Self-pay | Admitting: Cardiovascular Disease

## 2023-09-16 DIAGNOSIS — Z7901 Long term (current) use of anticoagulants: Secondary | ICD-10-CM | POA: Diagnosis not present

## 2023-09-16 DIAGNOSIS — I87332 Chronic venous hypertension (idiopathic) with ulcer and inflammation of left lower extremity: Secondary | ICD-10-CM | POA: Diagnosis not present

## 2023-09-16 DIAGNOSIS — G603 Idiopathic progressive neuropathy: Secondary | ICD-10-CM | POA: Diagnosis not present

## 2023-09-16 DIAGNOSIS — L97822 Non-pressure chronic ulcer of other part of left lower leg with fat layer exposed: Secondary | ICD-10-CM | POA: Diagnosis not present

## 2023-09-16 DIAGNOSIS — I2510 Atherosclerotic heart disease of native coronary artery without angina pectoris: Secondary | ICD-10-CM | POA: Diagnosis not present

## 2023-09-16 DIAGNOSIS — I480 Paroxysmal atrial fibrillation: Secondary | ICD-10-CM | POA: Diagnosis not present

## 2023-09-16 NOTE — Progress Notes (Signed)
CLEA, CAINE [place] ([id number]) [id number] Page 1 of 2 Visit Report for 09/16/2023 Physician Orders Details Patient Name: Date of Service: Sarah Eric, DO RO [facility] M. 09/16/2023 11:15 A M Medical Record Number: [id number] Patient Account Number: [id number] Date of Birth/Sex: Treating RN: [birth year] (82 y.o. Sarah Riddle Primary Care Provider: Yves Riddle Other Clinician: Referring Provider: Treating Provider/Extender: Sarah Riddle, Sarah Riddle in Treatment: 0 The following information was scribed by: Sarah Riddle The information was scribed for: Sarah Riddle Verbal / Phone Orders: No Diagnosis Coding Follow-up Appointments Return Appointment in 1 week. Nurse Visit as needed - PRN Bathing/ Shower/ Hygiene May shower with wound dressing protected with water repellent cover or cast protector. No tub bath. Anesthetic (Use 'Patient Medications' Section for Anesthetic Order Entry) Lidocaine applied to wound bed Edema Control - Orders / Instructions Optional: One layer of unna paste to top of compression wrap (to act as an anchor). UrgoK2 LITE Elevate, Exercise Daily and A void Standing for Long Periods of Time. Elevate legs to the level of the heart and pump ankles as often as possible Elevate leg(s) parallel to the floor when sitting. Wound Treatment Wound #1 - Lower Leg Wound Laterality: Left, Anterior Cleanser: Soap and Water 1 x Per Week/15 Days Discharge Instructions: Gently cleanse wound with antibacterial soap, rinse and pat dry prior to dressing wounds Cleanser: Vashe 5.8 (oz) 1 x Per Week/15 Days Discharge Instructions: Use vashe 5.8 (oz) as directed Prim Dressing: Promogran Matrix 4.34 (in) 1 x Per Week/15 Days ary Discharge Instructions: Moisten w/normal saline or sterile water; Cover wound as directed. Do not remove from wound bed. Prim Dressing: Xeroform 4x4-HBD (in/in) 1 x Per Week/15 Days ary Discharge Instructions: Apply  Xeroform 4x4-HBD (in/in) as directed Secondary Dressing: Zetuvit Plus 4x8 (in/in) 1 x Per Week/15 Days Compression Wrap: Urgo K2 Lite, two layer compression system, regular 1 x Per Week/15 Days Electronic Signature(s) Signed: 09/16/2023 11:35:40 AM By: Sarah Riddle Signed: 09/16/2023 12:55:12 PM By: Sarah Derry PA-C Entered By: Sarah Riddle on 09/16/2023 08:35:40 Riddle, Sarah Churn ([id number]) [id number] Page 2 of 2 -------------------------------------------------------------------------------- SuperBill Details Patient Name: Date of Service: Sarah Eric, DO RO [facility] M. 09/16/2023 Medical Record Number: [id number] Patient Account Number: [id number] Date of Birth/Sex: Treating RN: 05/18/1941 (82 y.o. 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

## 2023-09-16 NOTE — Progress Notes (Signed)
BANDI, RENSLOW [place] ([id number]) [id number] Page 1 of 4 Visit Report for 09/16/2023 Arrival Information Details Patient Name: Date of Service: Sarah Eric, DO [place] M. 09/16/2023 11:15 A M Medical Record Number: [id number] Patient Account Number: [id number] Date of Birth/Sex: Treating RN: 02-07-1941 (82 y.o. Esmeralda Links Primary Care Daeja Helderman: Yves Dill Other Clinician: Referring Artyom Stencel: Treating Gearald Stonebraker/Extender: Luan Moore, Neelam Weeks in Treatment: 0 Visit Information History Since Last Visit Added or deleted any medications: No Patient Arrived: Ambulatory Any new allergies or adverse reactions: No Arrival Time: 11:15 Signs or symptoms of abuse/neglect since last visito No Accompanied By: family Hospitalized since last visit: No Transfer Assistance: None Has Dressing in Place as Prescribed: Yes Patient Identification Verified: Yes Has Compression in Place as Prescribed: Yes Secondary Verification Process Completed: Yes Pain Present Now: Yes Patient Requires Transmission-Based No Precautions: Patient Has Alerts: Yes Patient Alerts: Patient on Blood Thinner Warfarin ABI R 1.36 L1.22 08/31/23 Electronic Signature(s) Signed: 09/16/2023 11:34:13 AM By: Angelina Pih Entered By: Angelina Pih on 09/16/2023 08:34:12 -------------------------------------------------------------------------------- Clinic Level of Care Assessment Details Patient Name: Date of Service: Sarah Eric, DO Sarah [facility] M. 09/16/2023 11:15 A M Medical Record Number: [id number] Patient Account Number: [id number] Date of Birth/Sex: Treating RN: [birth year] (82 y.o. Esmeralda Links Primary Care Maciah Schweigert: Yves Dill Other Clinician: Referring Fionna Merriott: Treating Cherae Marton/Extender: Luan Moore, Neelam Weeks in Treatment: 0 Clinic Level of Care Assessment Items TOOL 1 Quantity Score []  - 0 Use when EandM and Procedure is performed on INITIAL visit ASSESSMENTS -  Nursing Assessment / Reassessment []  - 0 General Physical Exam (combine w/ comprehensive assessment (listed just below) when performed on new pt. evals) []  - 0 Comprehensive Assessment (HX, ROS, Risk Assessments, Wounds Hx, etc.) Sarah Riddle, Sarah Riddle ([id number]) [id number] Page 2 of 4 ASSESSMENTS - Wound and Skin Assessment / Reassessment []  - 0 Dermatologic / Skin Assessment (not related to wound area) ASSESSMENTS - Ostomy and/or Continence Assessment and Care []  - 0 Incontinence Assessment and Management []  - 0 Ostomy Care Assessment and Management (repouching, etc.) PROCESS - Coordination of Care []  - 0 Simple Patient / Family Education for ongoing care []  - 0 Complex (extensive) Patient / Family Education for ongoing care []  - 0 Staff obtains Chiropractor, Records, T Results / Process Orders est []  - 0 Staff telephones HHA, Nursing Homes / Clarify orders / etc []  - 0 Routine Transfer to another Facility (non-emergent condition) []  - 0 Routine Hospital Admission (non-emergent condition) []  - 0 New Admissions / Manufacturing engineer / Ordering NPWT Apligraf, etc. , []  - 0 Emergency Hospital Admission (emergent condition) PROCESS - Special Needs []  - 0 Pediatric / Minor Patient Management []  - 0 Isolation Patient Management []  - 0 Hearing / Language / Visual special needs []  - 0 Assessment of Community assistance (transportation, D/C planning, etc.) []  - 0 Additional assistance / Altered mentation []  - 0 Support Surface(s) Assessment (bed, cushion, seat, etc.) INTERVENTIONS - Miscellaneous []  - 0 External ear exam []  - 0 Patient Transfer (multiple staff / Nurse, adult / Similar devices) []  - 0 Simple Staple / Suture removal (25 or less) []  - 0 Complex Staple / Suture removal (26 or more) []  - 0 Hypo/Hyperglycemic Management (do not check if billed separately) []  - 0 Ankle / Brachial Index (ABI) - do not check if billed separately Has  the patient been seen at the hospital within the last three years: Yes Total Score: 0 Level Of Care: ____ Electronic Signature(s) Signed: 09/16/2023  12:18:53 PM By: Angelina Pih Entered By: Angelina Pih on 09/16/2023 08:36:06 -------------------------------------------------------------------------------- Compression Therapy Details Patient Name: Date of Service: Sarah Caprice Renshaw, DO Sarah [facility]Y M. 09/16/2023 11:15 A M Medical Record Number: [id number] Patient Account Number: [id number] Date of Birth/Sex: Treating RN: 11/30/1940 (82 y.o. Esmeralda Links Primary Care Leena Tiede: Yves Dill Other Clinician: Referring Raymone Pembroke: Treating Dimetri Armitage/Extender: Luan Moore, Neelam Weeks in Treatment: 0 [place], [place] Judie Petit ([id number]) [id number] Page 3 of 4 Compression Therapy Performed for Wound Assessment: Wound #1 Left,Anterior Lower Leg Performed By: Clinician Angelina Pih, RN Compression Type: Double Layer Electronic Signature(s) Signed: 09/16/2023 11:34:33 AM By: Angelina Pih Entered By: Angelina Pih on 09/16/2023 08:34:33 -------------------------------------------------------------------------------- Encounter Discharge Information Details Patient Name: Date of Service: Sarah Eric, DO Sarah [facility]Y M. 09/16/2023 11:15 A M Medical Record Number: [id number] Patient Account Number: [id number] Date of Birth/Sex: Treating RN: 1941/05/23 (82 y.o. Esmeralda Links Primary Care Zala Degrasse: Yves Dill Other Clinician: Referring Zuzanna Maroney: Treating Shara Hartis/Extender: Luan Moore, Neelam Weeks in Treatment: 0 Encounter Discharge Information Items Discharge Condition: Stable Ambulatory Status: Ambulatory Discharge Destination: Home Transportation: Private Auto Accompanied By: family Schedule Follow-up Appointment: Yes Clinical Summary of Care: Electronic Signature(s) Signed: 09/16/2023 11:36:00 AM By: Angelina Pih Entered By: Angelina Pih on  09/16/2023 08:36:00 -------------------------------------------------------------------------------- Wound Assessment Details Patient Name: Date of Service: Sarah Eric, DO Sarah THY M. 09/16/2023 11:15 A M Medical Record Number: [id number] Patient Account Number: [id number] Date of Birth/Sex: Treating RN: [birth year] (82 y.o. Esmeralda Links Primary Care Josilynn Losh: Yves Dill Other Clinician: Referring Trell Secrist: Treating Ison Wichmann/Extender: Luan Moore, Neelam Weeks in Treatment: 0 Wound Status Wound Number: 1 Primary Venous Leg Ulcer Etiology: Wound Location: Left, Anterior Lower Leg Wound Status: Open Wounding Event: Trauma Comorbid Sleep Apnea, Coronary Artery Disease, Hypertension, Date Acquired: 08/24/2023 History: Neuropathy Weeks Of Treatment: 0 Clustered Wound: No Sarah Riddle, Sarah Riddle ([id number]) [id number] Page 4 of 4 Wound Measurements Length: (cm) 6.2 Width: (cm) 4.5 Depth: (cm) 0.1 Area: (cm) 21.913 Volume: (cm) 2.191 % Reduction in Area: 0% % Reduction in Volume: 0% Epithelialization: Medium (34-66%) Tunneling: No Undermining: No Wound Description Classification: Full Thickness Without Exposed Suppor Wound Margin: Distinct, outline attached Exudate Amount: Medium Exudate Type: Serosanguineous Exudate Color: red, brown t Structures Foul Odor After Cleansing: No Slough/Fibrino No Wound Bed Granulation Amount: Medium (34-66%) Exposed Structure Granulation Quality: Red, Pink Fascia Exposed: No Necrotic Amount: Small (1-33%) Fat Layer (Subcutaneous Tissue) Exposed: Yes Necrotic Quality: Eschar Tendon Exposed: No Muscle Exposed: No Joint Exposed: No Bone Exposed: No Treatment Notes Wound #1 (Lower Leg) Wound Laterality: Left, Anterior Cleanser Soap and Water Discharge Instruction: Gently cleanse wound with antibacterial soap, rinse and pat dry prior to dressing wounds Vashe 5.8 (oz) Discharge Instruction: Use vashe 5.8 (oz)  as directed Peri-Wound Care Topical Primary Dressing Promogran Matrix 4.34 (in) Discharge Instruction: Moisten w/normal saline or sterile water; Cover wound as directed. Do not remove from wound bed. Xeroform 4x4-HBD (in/in) Discharge Instruction: Apply Xeroform 4x4-HBD (in/in) as directed Secondary Dressing Zetuvit Plus 4x8 (in/in) Secured With Compression Wrap Urgo K2 Lite, two layer compression system, regular Compression Stockings Add-Ons Electronic Signature(s) Signed: 09/16/2023 12:18:53 PM By: Angelina Pih Entered By: Angelina Pih on 09/16/2023 08:16:19

## 2023-09-20 ENCOUNTER — Encounter: Payer: Medicare PPO | Admitting: Physician Assistant

## 2023-09-20 DIAGNOSIS — I2510 Atherosclerotic heart disease of native coronary artery without angina pectoris: Secondary | ICD-10-CM | POA: Diagnosis not present

## 2023-09-20 DIAGNOSIS — L97822 Non-pressure chronic ulcer of other part of left lower leg with fat layer exposed: Secondary | ICD-10-CM | POA: Diagnosis not present

## 2023-09-20 DIAGNOSIS — G603 Idiopathic progressive neuropathy: Secondary | ICD-10-CM | POA: Diagnosis not present

## 2023-09-20 DIAGNOSIS — Z7901 Long term (current) use of anticoagulants: Secondary | ICD-10-CM | POA: Diagnosis not present

## 2023-09-20 DIAGNOSIS — I87332 Chronic venous hypertension (idiopathic) with ulcer and inflammation of left lower extremity: Secondary | ICD-10-CM | POA: Diagnosis not present

## 2023-09-20 DIAGNOSIS — I480 Paroxysmal atrial fibrillation: Secondary | ICD-10-CM | POA: Diagnosis not present

## 2023-09-20 DIAGNOSIS — I872 Venous insufficiency (chronic) (peripheral): Secondary | ICD-10-CM | POA: Diagnosis not present

## 2023-09-20 NOTE — Progress Notes (Addendum)
MODINE, LUIS ([id number]) [id number] Page 1 of 8 Visit Report for 09/20/2023 Chief Complaint Document Details Patient Name: Date of Service: Sarah Eric, DO RO [facility] M. 09/20/2023 8:30 A M Medical Record Number: [id number] Patient Account Number: [id number] Date of Birth/Sex: Treating RN: 05/01/1941 (82 y.o. Ginette Pitman Primary Care Provider: Yves Dill Other Clinician: Referring Provider: Treating Provider/Extender: Luan Moore, Neelam Weeks in Treatment: 1 Information Obtained from: Patient Chief Complaint Left LE Ulcer Electronic Signature(s) Signed: 09/20/2023 8:32:26 AM By: Allen Derry PA-C Entered By: Allen Derry on 09/20/2023 05:32:26 -------------------------------------------------------------------------------- Debridement Details Patient Name: Date of Service: [name], DO RO [facility]Y M. 09/20/2023 8:30 A M Medical Record Number: [id number] Patient Account Number: [id number] Date of Birth/Sex: Treating RN: [birth year] (82 y.o. Ginette Pitman Primary Care Provider: Yves Dill Other Clinician: Referring Provider: Treating Provider/Extender: Luan Moore, Neelam Weeks in Treatment: 1 Debridement Performed for Assessment: Wound #1 Left,Anterior Lower Leg Performed By: Physician Allen Derry, PA-C The following information was scribed by: Midge Aver The information was scribed for: Allen Derry Debridement Type: Debridement Severity of Tissue Pre Debridement: Fat layer exposed Level of Consciousness (Pre-procedure): Awake and Alert Pre-procedure Verification/Time Out Yes - 08:52 Taken: Start Time: 08:52 Pain Control: Lidocaine 4% T opical Solution Percent of Wound Bed Debrided: 50% T Area Debrided (cm): otal 7.38 Tissue and other material debrided: Viable, Non-Viable, Slough, Subcutaneous, Biofilm, Slough Level: Skin/Subcutaneous Tissue Debridement Description: Excisional Instrument: Curette Bleeding: Minimum TZIPPORAH, JURICK ([id number]) [id number] Page 2 of 8 Hemostasis Achieved: Pressure Procedural Pain: 0 Post Procedural Pain: 0 Response to Treatment: Procedure was tolerated well Level of Consciousness (Post- Awake and Alert procedure): Post Debridement Measurements of Total Wound Length: (cm) 4.7 Width: (cm) 4 Depth: (cm) 0.1 Volume: (cm) 1.477 Character of Wound/Ulcer Post Debridement: Stable Severity of Tissue Post Debridement: Fat layer exposed Post Procedure Diagnosis Same as Pre-procedure Electronic Signature(s) Signed: 09/20/2023 4:17:14 PM By: Midge Aver MSN RN CNS WTA Signed: 09/23/2023 12:16:54 PM By: Allen Derry PA-C Entered By: Midge Aver on 09/20/2023 05:56:28 -------------------------------------------------------------------------------- HPI Details Patient Name: Date of Service: Sarah Eric, DO RO [facility] M. 09/20/2023 8:30 A M Medical Record Number: [id number] Patient Account Number: [id number] Date of Birth/Sex: Treating RN: 03-20-1941 (82 y.o. Ginette Pitman Primary Care Provider: Yves Dill Other Clinician: Referring Provider: Treating Provider/Extender: Luan Moore, Neelam Weeks in Treatment: 1 History of Present Illness Chronic/Inactive Conditions Condition 1: 09-13-2023 upon evaluation today patient's ABIs on the right 1.36 on the left 1.22 and this was performed on August 31, 2023 when she was in the hospital. She seems to have good arterial flow to be able to heal the wound. HPI Description: 09-13-2023 upon evaluation today patient appears to be doing poorly currently in regard to her wound which is on the left anterior lower extremity. This is a wound that was acquired on August 24, 2023. With that being said the patient had this skin tear that occurred as a result of trauma and then subsequently was in the hospital from November 20 until November 29. Xeroform has been used along with border foam dressings. She is on Coumadin. With  that being said she did have ABIs done and they are noted above. Patient does have a history of chronic venous hypertension, neuropathy, atrial fibrillation, coronary artery disease, and she is on long-term anticoagulant therapy specifically Coumadin. 09-20-2023 upon evaluation patient appears to be doing better in regard to the wound. Overall I do believe that we are seeing signs of  improvement and in fact the wound is measuring smaller. I am very pleased in that regard and I think that she is making good headway here. Electronic Signature(s) Signed: 09/23/2023 12:17:54 PM By: Allen Derry PA-C Entered By: Allen Derry on 09/23/2023 09:17:54 Mabin, [name] ([id number]) [id number] Page 3 of 8 -------------------------------------------------------------------------------- Physical Exam Details Patient Name: Date of Service: Sarah Eric, DO RO [facility] M. 09/20/2023 8:30 A M Medical Record Number: [id number] Patient Account Number: [id number] Date of Birth/Sex: Treating RN: 09/17/1941 (82 y.o. Ginette Pitman Primary Care Provider: Yves Dill Other Clinician: Referring Provider: Treating Provider/Extender: Luan Moore, Neelam Weeks in Treatment: 1 Constitutional Well-nourished and well-hydrated in no acute distress. Respiratory normal breathing without difficulty. Psychiatric this patient is able to make decisions and demonstrates good insight into disease process. Alert and Oriented x 3. pleasant and cooperative. Notes Upon inspection patient's wound bed actually showed signs of good granulation epithelization at this point. Fortunately I do not see any signs of active infection looking or systemically at this time which is great news and in general I do believe that she is doing much better. I did perform debridement clearway necrotic debris postdebridement this is significantly improved. Electronic Signature(s) Signed: 09/23/2023 12:18:16 PM By: Allen Derry  PA-C Entered By: Allen Derry on 09/23/2023 09:18:16 -------------------------------------------------------------------------------- Physician Orders Details Patient Name: Date of Service: [name], DO RO [facility]Y M. 09/20/2023 8:30 A M Medical Record Number: [id number] Patient Account Number: [id number] Date of Birth/Sex: Treating RN: 1940/12/09 (81 y.o. 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 Nurse Visit as needed - PRN Bathing/ Shower/ Hygiene May shower with wound dressing protected with water repellent cover or cast protector. No tub bath. Anesthetic (Use 'Patient Medications' Section for Anesthetic Order Entry) Lidocaine applied to wound bed Edema Control - Orders / Instructions Optional: One layer of unna paste to top of compression wrap (to act as an anchor). UrgoK2 LITE Elevate, Exercise Daily and A void Standing for Long Periods of Time. Elevate legs to the level of the heart and pump ankles as often as possible Elevate leg(s) parallel to the floor when sitting. Wound Treatment Wound #1 - Lower Leg Wound Laterality: Left, Anterior Cleanser:  Soap and Water 1 x Per Week/15 Days Discharge Instructions: Gently cleanse wound with antibacterial soap, rinse and pat dry prior to dressing wounds Cleanser: Vashe 5.8 (oz) 1 x Per Week/15 Days Discharge Instructions: Use vashe 5.8 (oz) as directed Prim Dressing: Promogran Matrix 4.34 (in) 1 x Per Week/15 Days ary Discharge Instructions: Moisten w/normal saline or sterile water; Cover wound as directed. Do not remove from wound bed. Prim Dressing: Xeroform 4x4-HBD (in/in) 1 x Per Week/15 Days ary Discharge Instructions: Apply Xeroform 4x4-HBD (in/in) as directed Secondary Dressing: Zetuvit Plus 4x8 (in/in) 1 x Per Week/15 Days Compression Wrap: Urgo K2 Lite, two layer compression system, regular 1  x Per Week/15 Days Electronic Signature(s) Signed: 09/20/2023 4:17:14 PM By: Midge Aver MSN RN CNS WTA Signed: 09/23/2023 12:16:54 PM By: Allen Derry PA-C Entered By: Midge Aver on 09/20/2023 05:53:50 -------------------------------------------------------------------------------- Problem List Details Patient Name: Date of Service: Sarah Eric, DO RO [facility] M. 09/20/2023 8:30 A M Medical Record Number: [id number] Patient Account Number: [id number] Date of Birth/Sex: Treating RN: [birth year] (82 y.o. 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.3 Idiopathic progressive neuropathy 09/13/2023 No Yes I48.0 Paroxysmal atrial fibrillation 09/13/2023 No Yes I25.10 Atherosclerotic heart disease of  native coronary artery without angina pectoris 09/13/2023 No Yes Z79.01 Long term (current) use of anticoagulants 09/13/2023 No Yes Inactive Problems Resolved Problems Electronic Signature(s) Signed: 09/20/2023 4:17:14 PM By: Midge Aver MSN RN CNS WTA Signed: 09/23/2023 12:16:54 PM By: Allen Derry PA-C Previous Signature: 09/20/2023 8:32:15 AM Version By: Allen Derry PA-C Entered By: Midge Aver on 09/20/2023 05:54:43 -------------------------------------------------------------------------------- Progress Note Details Patient Name: Date of Service: Sarah Eric, DO RO [facility] M. 09/20/2023 8:30 A M Medical Record Number: [id number] Patient Account Number: [id number] Date of Birth/Sex: Treating RN: 1941-04-26 (82 y.o. Ginette Pitman Primary Care Provider: Yves Dill Other Clinician: Referring Provider: Treating Provider/Extender: Luan Moore, Neelam Weeks in Treatment: 1 Subjective Chief Complaint Information obtained from Patient Left LE Ulcer History of Present Illness (HPI) Chronic/Inactive Condition: 09-13-2023 upon evaluation today patient's ABIs on the right 1.36 on the left 1.22 and this was performed on August 31, 2023 when she was in the hospital. She seems to have good arterial flow to be able to heal the wound. 09-13-2023 upon evaluation today patient appears to be doing poorly currently in regard to her wound which is on the left anterior lower extremity. This is a wound that was acquired on August 24, 2023. With that being said the patient had this skin tear that occurred as a result of trauma and then subsequently was in the hospital from November 20 until November 29. Xeroform has been used along with border foam dressings. She is on Coumadin. With that being said she did have ABIs done and they are noted above. Patient does have a history of chronic venous hypertension, neuropathy, atrial fibrillation, coronary artery disease, and she is on long-term  anticoagulant therapy specifically Coumadin. 09-20-2023 upon evaluation patient appears to be doing better in regard to the wound. Overall I do believe that we are seeing signs of improvement and in fact the wound is measuring smaller. I am very pleased in that regard and I think that she is making good headway here. ARVADA, SCHWERTNER ([id number]) [id number] Page 6 of 8 Objective Constitutional Well-nourished and well-hydrated in no acute distress. Vitals Time Taken: 8:28 AM, Height: 71 in, Weight: 190 lbs, BMI: 26.5, Temperature: 98.3 F, Pulse: 58 bpm, Respiratory Rate: 18 breaths/min, Blood Pressure: 135/68 mmHg. Respiratory normal breathing without difficulty. Psychiatric this patient is able to make decisions and demonstrates good insight into disease process. Alert and Oriented x 3. pleasant and cooperative. General Notes: Upon inspection patient's wound bed actually showed signs of good granulation epithelization at this point. Fortunately I do not see  any signs of active infection looking or systemically at this time which is great news and in general I do believe that she is doing much better. I did perform debridement clearway necrotic debris postdebridement this is significantly improved. Integumentary (Hair, Skin) Wound #1 status is Open. Original cause of wound was Trauma. The date acquired was: 08/24/2023. The wound has been in treatment 1 weeks. The wound is located on the Left,Anterior Lower Leg. The wound measures 4.7cm length x 4cm width x 0.1cm depth; 14.765cm^2 area and 1.477cm^3 volume. There is Fat Layer (Subcutaneous Tissue) exposed. There is a medium amount of serosanguineous drainage noted. The wound margin is distinct with the outline attached to the wound base. There is medium (34-66%) red, pink granulation within the wound bed. There is a small (1-33%) amount of necrotic tissue within the wound bed including Eschar. 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 There was a Excisional Skin/Subcutaneous Tissue Debridement with a total area of 7.38 sq cm performed by Allen Derry, PA-C. With the following instrument(s): Curette to remove Viable and Non-Viable tissue/material. Material removed includes Subcutaneous Tissue, Slough, and Biofilm after achieving pain control using Lidocaine 4% Topical Solution. No specimens were taken. A time out was conducted at 08:52, prior to the start of the procedure. A Minimum amount of bleeding was controlled with Pressure. The procedure was tolerated well with a pain level of 0 throughout and a pain level of 0 following the procedure. Post Debridement Measurements: 4.7cm length x 4cm width x 0.1cm depth; 1.477cm^3 volume. Character of Wound/Ulcer Post Debridement is stable. Severity of Tissue Post Debridement is: Fat layer exposed. Post procedure Diagnosis Wound #1: Same as Pre-Procedure Pre-procedure diagnosis of Wound #1 is a Venous Leg Ulcer located on the Left,Anterior Lower Leg . There was a Three Layer Compression Therapy Procedure by Midge Aver, RN. Post procedure Diagnosis Wound #1: Same as Pre-Procedure Plan Follow-up Appointments: Return Appointment in 1 week. Nurse Visit as needed - PRN Bathing/ Shower/ Hygiene: May shower with wound dressing protected with water repellent cover or cast protector. No tub bath. Anesthetic (Use 'Patient Medications' Section  for Anesthetic Order Entry): Lidocaine applied to wound bed AUBREA, DELUCCIA ([id number]) [id number] Page 7 of 8 Edema Control - Orders / Instructions: Optional: One layer of unna paste to top of compression wrap (to act as an anchor). UrgoK2 LITE Elevate, Exercise Daily and Avoid Standing for Long Periods of Time. Elevate legs to the level of the heart and pump ankles as often as possible Elevate leg(s) parallel to the floor when sitting. WOUND #1: - Lower Leg Wound Laterality: Left, Anterior Cleanser: Soap and Water 1 x Per Week/15 Days Discharge Instructions: Gently cleanse wound with antibacterial soap, rinse and pat dry prior to dressing wounds Cleanser: Vashe 5.8 (oz) 1 x Per Week/15 Days Discharge Instructions: Use vashe 5.8 (oz) as directed Prim Dressing: Promogran Matrix 4.34 (in) 1 x Per Week/15 Days ary Discharge Instructions: Moisten w/normal saline or sterile water; Cover wound as directed. Do not remove from wound bed. Prim Dressing: Xeroform 4x4-HBD (in/in) 1  x Per Week/15 Days ary Discharge Instructions: Apply Xeroform 4x4-HBD (in/in) as directed Secondary Dressing: Zetuvit Plus 4x8 (in/in) 1 x Per Week/15 Days Com pression Wrap: Urgo K2 Lite, two layer compression system, regular 1 x Per Week/15 Days 1. I would recommend that we have the patient continue to monitor for any signs of infection or worsening. Right now I am going to suggest that we continue currently with the Xeroform gauze dressing with the Promogran underneath. 2. Also can recommend that we continue with Zetuvit to cover followed by the Urgo K2 compression wrap and the light version. We will see patient back for reevaluation in 1 week here in the clinic. If anything worsens or changes patient will contact our office for additional recommendations. Electronic Signature(s) Signed: 09/23/2023 12:18:40 PM By: Allen Derry PA-C Entered By: Allen Derry on 09/23/2023  09:18:40 -------------------------------------------------------------------------------- SuperBill Details Patient Name: Date of Service: [name], DO RO THY M. 09/20/2023 Medical Record Number: [id number] Patient Account Number: [id number] Date of Birth/Sex: Treating RN: Aug 22, 1941 (82 y.o. 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.3 Idiopathic progressive neuropathy I48.0 Paroxysmal atrial fibrillation I25.10 Atherosclerotic heart disease of native coronary artery without angina pectoris Z79.01 Long term (current) use of anticoagulants Facility Procedures : CPT4 Code: [id number] Description: 11042 - DEB SUBQ TISSUE 20 SQ CM/< ICD-10 Diagnosis Description L97.822 Non-pressure chronic ulcer of other part of left lower leg with fat layer expos Modifier: ed Quantity: 1 Physician Procedures : CPT4 Code Description Modifier 11042 11042 - WC PHYS SUBQ TISS 20 SQ CM KHYIA, AFIFI ([id number]) [id number] Pag ICD-10 Diagnosis Description [phone] Non-pressure chronic ulcer of other part of left lower leg with fat  layer exposed Quantity: 1 e 8 of 8 Electronic Signature(s) Signed: 09/23/2023 12:19:11 PM By: Allen Derry PA-C Entered By: Allen Derry on 09/23/2023 09:19:11

## 2023-09-21 NOTE — Progress Notes (Signed)
MAHLANI, DIRKSEN Q ([id number]) [id number] Page 1 of 9 Visit Report for 09/20/2023 Arrival Information Details Patient Name: Date of Service: Sarah Riddle, [place] Sarah [employer] Riddle M. 09/20/2023 8:30 A M Medical Record Number: [id number] Patient Account Number: [id number] Date of Birth/Sex: Treating RN: 02-14-1941 (82 y.o. Ginette Pitman Primary Care Parneet Glantz: Yves Dill Other Clinician: Referring Shanvi Moyd: Treating Dainel Arcidiacono/Extender: Luan Moore, Neelam Weeks in Treatment: 1 Visit Information History Since Last Visit Added or deleted any medications: No Patient Arrived: Ambulatory Any new allergies or adverse reactions: No Arrival Time: 08:23 Hospitalized since last visit: No Accompanied By: daughter Has Dressing in Place as Prescribed: Yes Transfer Assistance: None Has Compression in Place as Prescribed: Yes Patient Identification Verified: Yes Pain Present Now: No Secondary Verification Process Completed: Yes Patient Requires Transmission-Based No Precautions: Patient Has Alerts: Yes Patient Alerts: Patient on Blood Thinner Warfarin ABI R 1.36 L1.22 08/31/23 Electronic Signature(s) Signed: 09/20/2023 4:17:14 PM By: Midge Aver MSN RN CNS WTA Entered By: Midge Aver on 09/20/2023 05:27:39 -------------------------------------------------------------------------------- Clinic Level of Care Assessment Details Patient Name: Date of Service: Sarah Caprice Renshaw, DO Sarah Riddle M. 09/20/2023 8:30 A M Medical Record Number: [id number] Patient Account Number: [id number] Date of Birth/Sex: Treating RN: [birth year] (82 y.o. Ginette Pitman Primary Care Dhana Totton: Yves Dill Other Clinician: Referring Saivon Prowse: Treating Avalyn Molino/Extender: Luan Moore, Neelam Weeks in Treatment: 1 Clinic Level of Care Assessment Items TOOL 1 Quantity Score []  - 0 Use when EandM and Procedure is performed on INITIAL visit ASSESSMENTS - Nursing Assessment / Reassessment []  - 0 General  Physical Exam (combine w/ comprehensive assessment (listed just below) when performed on new pt. evals) []  - 0 Comprehensive Assessment (HX, ROS, Risk Assessments, Wounds Hx, etc.) ASSESSMENTS - Wound and Skin Assessment / Reassessment VONDALEE, LIEDEL ([id number]) [id number] Page 2 of 9 []  - 0 Dermatologic / Skin Assessment (not related to wound area) ASSESSMENTS - Ostomy and/or Continence Assessment and Care []  - 0 Incontinence Assessment and Management []  - 0 Ostomy Care Assessment and Management (repouching, etc.) PROCESS - Coordination of Care []  - 0 Simple Patient / Family Education for ongoing care []  - 0 Complex (extensive) Patient / Family Education for ongoing care []  - 0 Staff obtains Chiropractor, Records, T Results / Process Orders est []  - 0 Staff telephones HHA, Nursing Homes / Clarify orders / etc []  - 0 Routine Transfer to another Facility (non-emergent condition) []  - 0 Routine Hospital Admission (non-emergent condition) []  - 0 New Admissions / Manufacturing engineer / Ordering NPWT Apligraf, etc. , []  - 0 Emergency Hospital Admission (emergent condition) PROCESS - Special Needs []  - 0 Pediatric / Minor Patient Management []  - 0 Isolation Patient Management []  - 0 Hearing / Language / Visual special needs []  - 0 Assessment of Community assistance (transportation, D/C planning, etc.) []  - 0 Additional assistance / Altered mentation []  - 0 Support Surface(s) Assessment (bed, cushion, seat, etc.) INTERVENTIONS - Miscellaneous []  - 0 External ear exam []  - 0 Patient Transfer (multiple staff / Nurse, adult / Similar devices) []  - 0 Simple Staple / Suture removal (25 or less) []  - 0 Complex Staple / Suture removal (26 or more) []  - 0 Hypo/Hyperglycemic Management (do not check if billed separately) []  - 0 Ankle / Brachial Index (ABI) - do not check if billed separately Has the patient been seen at the hospital within the last  three years: Yes Total Score: 0 Level Of Care: ____ Electronic Signature(s) Signed: 09/20/2023 4:17:14 PM By: Midge Aver  MSN RN CNS WTA Entered By: Midge Aver on 09/20/2023 05:53:57 -------------------------------------------------------------------------------- Compression Therapy Details Patient Name: Date of Service: Sarah Eric, DO Sarah Riddle M. 09/20/2023 8:30 A M Medical Record Number: [id number] Patient Account Number: [id number] Date of Birth/Sex: Treating RN: 09-21-1941 (82 y.o. Ginette Pitman Primary Care Kage Willmann: Yves Dill Other Clinician: Referring Efe Fazzino: Treating Elleah Hemsley/Extender: Luan Moore, Neelam Weeks in Treatment: 1 Compression Therapy Performed for Wound Assessment: Wound #1 Left,Anterior Lower Leg FYNN, VANWEELDEN ([id number]) [phone].pdf Page 3 of 9 Performed By: Clinician Midge Aver, RN Compression Type: Three Layer Post Procedure Diagnosis Same as Pre-procedure Electronic Signature(s) Signed: 09/20/2023 4:17:14 PM By: Midge Aver MSN RN CNS WTA Entered By: Midge Aver on 09/20/2023 05:53:17 -------------------------------------------------------------------------------- Encounter Discharge Information Details Patient Name: Date of Service: Sarah Eric, DO Sarah THY M. 09/20/2023 8:30 A M Medical Record Number: [id number] Patient Account Number: [id number] Date of Birth/Sex: Treating RN: Jun 04, 1941 (82 y.o. Ginette Pitman Primary Care Erasmus Bistline: Yves Dill Other Clinician: Referring Korinne Greenstein: Treating Kayti Poss/Extender: Luan Moore, Neelam Weeks in Treatment: 1 Encounter Discharge Information Items Post Procedure Vitals Discharge Condition: Stable Temperature (F): 98.3 Ambulatory Status: Ambulatory Pulse (bpm): 58 Discharge Destination: Home Respiratory Rate (breaths/min): 18 Transportation: Private Auto Blood Pressure (mmHg): 135/68 Accompanied By: daughter Schedule Follow-up Appointment: Yes Clinical  Summary of Care: Electronic Signature(s) Signed: 09/20/2023 4:17:14 PM By: Midge Aver MSN RN CNS WTA Entered By: Midge Aver on 09/20/2023 05:55:24 -------------------------------------------------------------------------------- Lower Extremity Assessment Details Patient Name: Date of Service: Sarah Caprice Renshaw, DO Sarah THY M. 09/20/2023 8:30 A M Medical Record Number: [id number] Patient Account Number: [id number] Date of Birth/Sex: Treating RN: 1940-12-28 (82 y.o. Ginette Pitman Primary Care Shanaiya Bene: Yves Dill Other Clinician: Referring Rayshell Goecke: Treating Irish Piech/Extender: Luan Moore, Neelam Weeks in Treatment: 1 Edema Assessment Assessed: [Left: Yes] [Right: No] [Left: Edema] Franne Forts: :] R[LeftDenton Meek ([id number])] [Right: [id number] Page 4 of 9] Calf Left: Right: Point of Measurement: 34 cm From Medial Instep 36.5 cm Ankle Left: Right: Point of Measurement: 12 cm From Medial Instep 26.5 cm Knee To Floor Left: Right: From Medial Instep 38 cm Vascular Assessment Pulses: Dorsalis Pedis Palpable: [Left:Yes] Extremity colors, hair growth, and conditions: Extremity Color: [Left:Normal] Hair Growth on Extremity: [Left:No] Temperature of Extremity: [Left:Warm] Capillary Refill: [Left:< 3 seconds] Dependent Rubor: [Left:No] Blanched when Elevated: [Left:No No] Toe Nail Assessment Left: Right: Thick: No Discolored: No Deformed: No Improper Length and Hygiene: No Electronic Signature(s) Signed: 09/20/2023 4:17:14 PM By: Midge Aver MSN RN CNS WTA Entered By: Midge Aver on 09/20/2023 05:38:11 -------------------------------------------------------------------------------- Multi Wound Chart Details Patient Name: Date of Service: Sarah Eric, DO Sarah THY M. 09/20/2023 8:30 A M Medical Record Number: [id number] Patient Account Number: [id number] Date of Birth/Sex: Treating RN: 10-Aug-1941 (82 y.o. Ginette Pitman Primary Care Marcena Dias:  Yves Dill Other Clinician: Referring Tapanga Ottaway: Treating Amrita Radu/Extender: Luan Moore, Neelam Weeks in Treatment: 1 Vital Signs Height(in): 71 Pulse(bpm): 58 Weight(lbs): 190 Blood Pressure(mmHg): 135/68 Body Mass Index(BMI): 26.5 Temperature(F): 98.3 Respiratory Rate(breaths/min): 18 [1:Photos:] [N/A:N/A] Left, Anterior Lower Leg N/A N/A Wound Location: Trauma N/A N/A Wounding Event: Venous Leg Ulcer N/A N/A Primary Etiology: Sleep Apnea, Coronary Artery N/A N/A Comorbid History: Disease, Hypertension, Neuropathy 08/24/2023 N/A N/A Date Acquired: 1 N/A N/A Weeks of Treatment: Open N/A N/A Wound Status: No N/A N/A Wound Recurrence: 4.7x4x0.1 N/A N/A Measurements L x W x D (cm) 14.765 N/A N/A A (cm) : rea 1.477 N/A N/A Volume (cm) : 32.60% N/A N/A % Reduction in Area:  32.60% N/A N/A % Reduction in Volume: Full Thickness Without Exposed N/A N/A Classification: Support Structures Medium N/A N/A Exudate A mount: Serosanguineous N/A N/A Exudate Type: red, brown N/A N/A Exudate Color: Distinct, outline attached N/A N/A Wound Margin: Medium (34-66%) N/A N/A Granulation Amount: Red, Pink N/A N/A Granulation Quality: Small (1-33%) N/A N/A Necrotic Amount: Eschar N/A N/A Necrotic Tissue: Fat Layer (Subcutaneous Tissue): Yes N/A N/A Exposed Structures: Fascia: No Tendon: No Muscle: No Joint: No Bone: No Medium (34-66%) N/A N/A Epithelialization: Treatment Notes Electronic Signature(s) Signed: 09/20/2023 4:17:14 PM By: Midge Aver MSN RN CNS WTA Entered By: Midge Aver on 09/20/2023 05:50:13 -------------------------------------------------------------------------------- Multi-Disciplinary Care Plan Details Patient Name: Date of Service: Sarah Eric, DO Sarah THY M. 09/20/2023 8:30 A M Medical Record Number: [id number] Patient Account Number: [id number] Date of Birth/Sex: Treating RN: 11/20/1940 (82 y.o. Ginette Pitman Primary Care Laith Antonelli:  Yves Dill Other Clinician: Referring Brigette Hopfer: Treating Byran Bilotti/Extender: Luan Moore, Neelam Weeks in Treatment: 1 Active Inactive Necrotic Tissue Nursing Diagnoses: Impaired tissue integrity related to necrotic/devitalized tissue Knowledge deficit related to management of necrotic/devitalized tissue Goals: WYNEMA, ELLINGSWORTH ([id number]) [id number] Page 6 of 9 Necrotic/devitalized tissue will be minimized in the wound bed Date Initiated: 09/13/2023 Target Resolution Date: 10/14/2023 Goal Status: Active Patient/caregiver will verbalize understanding of reason and process for debridement of necrotic tissue Date Initiated: 09/13/2023 Target Resolution Date: 10/04/2023 Goal Status: Active Interventions: Assess patient pain level pre-, during and post procedure and prior to discharge Provide education on necrotic tissue and debridement process Treatment Activities: Apply topical anesthetic as ordered : 09/13/2023 Excisional debridement : 09/13/2023 Notes: Wound/Skin Impairment Nursing Diagnoses: Impaired tissue integrity Knowledge deficit related to ulceration/compromised skin integrity Goals: Ulcer/skin breakdown will have a volume reduction of 30% by week 4 Date Initiated: 09/13/2023 Target Resolution Date: 10/14/2023 Goal Status: Active Ulcer/skin breakdown will have a volume reduction of 50% by week 8 Date Initiated: 09/13/2023 Target Resolution Date: 11/14/2023 Goal Status: Active Ulcer/skin breakdown will have a volume reduction of 80% by week 12 Date Initiated: 09/13/2023 Target Resolution Date: 12/12/2023 Goal Status: Active Ulcer/skin breakdown will heal within 14 weeks Date Initiated: 09/13/2023 Target Resolution Date: 12/26/2023 Goal Status: Active Interventions: Assess patient/caregiver ability to obtain necessary supplies Assess patient/caregiver ability to perform ulcer/skin care regimen upon admission and as needed Assess  ulceration(s) every visit Provide education on ulcer and skin care Treatment Activities: Skin care regimen initiated : 09/13/2023 Notes: Electronic Signature(s) Signed: 09/20/2023 4:17:14 PM By: Midge Aver MSN RN CNS WTA Entered By: Midge Aver on 09/20/2023 05:54:14 -------------------------------------------------------------------------------- Pain Assessment Details Patient Name: Date of Service: Sarah Eric, DO Sarah THY M. 09/20/2023 8:30 A M Medical Record Number: [id number] Patient Account Number: [id number] Date of Birth/Sex: Treating RN: 1941/08/31 (82 y.o. Ginette Pitman Primary Care Christoper Bushey: Yves Dill Other Clinician: Referring Aanya Haynes: Treating Meadow Abramo/Extender: Luan Moore, Neelam Weeks in Treatment: 1 Active Problems FAJR, ZUCCO V ([id number]) [id number] Page 7 of 9 Location of Pain Severity and Description of Pain Patient Has Paino No Site Locations Pain Management and Medication Current Pain Management: Electronic Signature(s) Signed: 09/20/2023 4:17:14 PM By: Midge Aver MSN RN CNS WTA Entered By: Midge Aver on 09/20/2023 33:29:51 -------------------------------------------------------------------------------- Patient/Caregiver Education Details Patient Name: Date of Service: Sarah Eric, DO Sarah Sunny Schlein. 12/17/2024andnbsp8:30 A M Medical Record Number: [id number] Patient Account Number: [id number] Date of Birth/Gender: Treating RN: 1941-06-05 (82 y.o. Ginette Pitman Primary Care Physician: Yves Dill Other Clinician: Referring Physician: Treating Physician/Extender: Luan Moore, Neelam Weeks in  Treatment: 1 Education Assessment Education Provided To: Patient Education Topics Provided Wound Debridement: Handouts: Wound Debridement Methods: Explain/Verbal Responses: State content correctly Wound/Skin Impairment: Handouts: Caring for Your Ulcer Methods: Explain/Verbal Responses: State content  correctly Electronic Signature(s) Signed: 09/20/2023 4:17:14 PM By: Midge Aver MSN RN CNS WTA Entered By: Midge Aver on 09/20/2023 05:54:31 Redmond, [name] ([id number]) [id number] Page 8 of 9 -------------------------------------------------------------------------------- Wound Assessment Details Patient Name: Date of Service: Sarah Eric, DO Sarah [facility] M. 09/20/2023 8:30 A M Medical Record Number: [id number] Patient Account Number: [id number] Date of Birth/Sex: Treating RN: November 14, 1940 (82 y.o. Ginette Pitman Primary Care Toussaint Golson: Yves Dill Other Clinician: Referring Tanicka Bisaillon: Treating Dalesha Stanback/Extender: Luan Moore, Neelam Weeks in Treatment: 1 Wound Status Wound Number: 1 Primary Venous Leg Ulcer Etiology: Wound Location: Left, Anterior Lower Leg Wound Status: Open Wounding Event: Trauma Comorbid Sleep Apnea, Coronary Artery Disease, Hypertension, Date Acquired: 08/24/2023 History: Neuropathy Weeks Of Treatment: 1 Clustered Wound: No Photos Wound Measurements Length: (cm) 4.7 Width: (cm) 4 Depth: (cm) 0.1 Area: (cm) 14.765 Volume: (cm) 1.477 % Reduction in Area: 32.6% % Reduction in Volume: 32.6% Epithelialization: Medium (34-66%) Wound Description Classification: Full Thickness Without Exposed Support Wound Margin: Distinct, outline attached Exudate Amount: Medium Exudate Type: Serosanguineous Exudate Color: red, brown Structures Foul Odor After Cleansing: No Slough/Fibrino No Wound Bed Granulation Amount: Medium (34-66%) Exposed Structure Granulation Quality: Red, Pink Fascia Exposed: No Necrotic Amount: Small (1-33%) Fat Layer (Subcutaneous Tissue) Exposed: Yes Necrotic Quality: Eschar Tendon Exposed: No Muscle Exposed: No Joint Exposed: No Bone Exposed: No Treatment Notes Wound #1 (Lower Leg) Wound Laterality: Left, Anterior Cleanser FEDERICA, MICHELLI ([id number]) [id number] Page 9 of 9 Soap  and Water Discharge Instruction: Gently cleanse wound with antibacterial soap, rinse and pat dry prior to dressing wounds Vashe 5.8 (oz) Discharge Instruction: Use vashe 5.8 (oz) as directed Peri-Wound Care Topical Primary Dressing Promogran Matrix 4.34 (in) Discharge Instruction: Moisten w/normal saline or sterile water; Cover wound as directed. Do not remove from wound bed. Xeroform 4x4-HBD (in/in) Discharge Instruction: Apply Xeroform 4x4-HBD (in/in) as directed Secondary Dressing Zetuvit Plus 4x8 (in/in) Secured With Compression Wrap Urgo K2 Lite, two layer compression system, regular Compression Stockings Add-Ons Electronic Signature(s) Signed: 09/20/2023 4:17:14 PM By: Midge Aver MSN RN CNS WTA Entered By: Midge Aver on 09/20/2023 05:36:25 -------------------------------------------------------------------------------- Vitals Details Patient Name: Date of Service: Sarah Eric, DO Sarah THY M. 09/20/2023 8:30 A M Medical Record Number: [id number] Patient Account Number: [id number] Date of Birth/Sex: Treating RN: Sep 15, 1941 (82 y.o. Ginette Pitman Primary Care Gabreal Worton: Yves Dill Other Clinician: Referring Daiel Strohecker: Treating Ricardo Schubach/Extender: Luan Moore, Neelam Weeks in Treatment: 1 Vital Signs Time Taken: 08:28 Temperature (F): 98.3 Height (in): 71 Pulse (bpm): 58 Weight (lbs): 190 Respiratory Rate (breaths/min): 18 Body Mass Index (BMI): 26.5 Blood Pressure (mmHg): 135/68 Reference Range: 80 - 120 mg / dl Electronic Signature(s) Signed: 09/20/2023 4:17:14 PM By: Midge Aver MSN RN CNS WTA Entered By: Midge Aver on 09/20/2023 05:28:30

## 2023-09-23 ENCOUNTER — Other Ambulatory Visit: Payer: Medicare PPO

## 2023-09-23 ENCOUNTER — Ambulatory Visit (INDEPENDENT_AMBULATORY_CARE_PROVIDER_SITE_OTHER): Payer: Medicare PPO | Admitting: Cardiology

## 2023-09-23 ENCOUNTER — Other Ambulatory Visit: Payer: Self-pay | Admitting: Internal Medicine

## 2023-09-23 ENCOUNTER — Encounter: Payer: Self-pay | Admitting: Cardiology

## 2023-09-23 VITALS — BP 134/68 | HR 72 | Ht 71.0 in | Wt 191.4 lb

## 2023-09-23 DIAGNOSIS — I82531 Chronic embolism and thrombosis of right popliteal vein: Secondary | ICD-10-CM | POA: Diagnosis not present

## 2023-09-23 DIAGNOSIS — I10 Essential (primary) hypertension: Secondary | ICD-10-CM

## 2023-09-23 DIAGNOSIS — Z0000 Encounter for general adult medical examination without abnormal findings: Secondary | ICD-10-CM

## 2023-09-23 DIAGNOSIS — R7301 Impaired fasting glucose: Secondary | ICD-10-CM | POA: Diagnosis not present

## 2023-09-23 DIAGNOSIS — E782 Mixed hyperlipidemia: Secondary | ICD-10-CM | POA: Diagnosis not present

## 2023-09-23 NOTE — Progress Notes (Signed)
Established Patient Office Visit  Subjective:  Patient ID: Sarah Riddle, female    DOB: [birth year]  Age: 82 y.o. MRN: [id number]  Chief Complaint  Patient presents with   Annual Exam    Patient in office for annual wellness visit.     No other concerns at this time.   Past Medical History:  Diagnosis Date   A-fib [facility])    Accelerated hypertension 10/31/2022   Age related osteoporosis    Cancer (HCC)    Melanoma x 2 -- Lt Leg -1976 Rt Arm - 2014   Chronic bilateral low back pain    Chronic kidney disease    Complication of anesthesia    DVT (deep venous thrombosis) (HCC) 1960   right leg  was on BC pills   Dysrhythmia    Family history of breast cancer    Family history of colon cancer    Family history of melanoma    Family history of prostate cancer    Fibrocystic breast disease    GERD (gastroesophageal reflux disease)    Headache    migraine   Heart murmur    not heard now.   History of kidney stones    Hyperlipidemia    Hypertension    IBS (irritable bowel syndrome)    Lumbago    Mitral valve prolapse    Neuropathy    Obstructive sleep apnea    Osteoarthritis of shoulder    PONV (postoperative nausea and vomiting)     Past Surgical History:  Procedure Laterality Date   ABDOMINAL HYSTERECTOMY     BACK SURGERY     BREAST BIOPSY Left 04/20/2023   [place] bx/ heart clip/ path pending   BREAST BIOPSY Left 04/20/2023   [place] LT BREAST BX W LOC DEV 1ST LESION IMG BX SPEC US GUIDE 04/20/2023 [facility]   BREAST CYST ASPIRATION Left 2001   Negative   CARDIAC ELECTROPHYSIOLOGY MAPPING AND ABLATION     EYE SURGERY     HUMERUS IM NAIL Right 05/05/2022   Procedure: INTRAMEDULLARY (IM) NAIL HUMERAL;  Surgeon: Roby Lofts, MD;  Location: [facility];  Service: Orthopedics;  Laterality: Right;   HYSTEROTOMY     L4-L5  2020   titaum rebok cage   LEFT HEART CATH AND CORONARY ANGIOGRAPHY N/A 11/01/2022   Procedure: LEFT HEART CATH AND CORONARY ANGIOGRAPHY and  possible PCI and stent;  Surgeon: Laurier Nancy, MD;  Location: [facility] INVASIVE CV LAB;  Service: Cardiovascular;  Laterality: N/A;   TOTAL HIP ARTHROPLASTY Left     Social History   Socioeconomic History   Marital status: Married    Spouse name: Not on file   Number of children: Not on file   Years of education: Not on file   Highest education level: Not on file  Occupational History   Not on file  Tobacco Use   Smoking status: Never   Smokeless tobacco: Never  Vaping Use   Vaping status: Never Used  Substance and Sexual Activity   Alcohol use: No   Drug use: No   Sexual activity: Not Currently  Other Topics Concern   Not on file  Social History Narrative   Not on file   Social Drivers of Health   Financial Resource Strain: Low Risk  (09/23/2023)   Overall Financial Resource Strain (CARDIA)    Difficulty of Paying Living Expenses: Not hard at all  Food Insecurity: No Food Insecurity (09/12/2023)   Hunger Vital Sign  Worried About Programme researcher, broadcasting/film/video in the Last Year: Never true    Ran Out of Food in the Last Year: Never true  Transportation Needs: No Transportation Needs (09/12/2023)   PRAPARE - Administrator, Civil Service (Medical): No    Lack of Transportation (Non-Medical): No  Physical Activity: Inactive (09/23/2023)   Exercise Vital Sign    Days of Exercise per Week: 0 days    Minutes of Exercise per Session: 0 min  Stress: No Stress Concern Present (09/23/2023)   [employer] of Occupational Health - Occupational Stress Questionnaire    Feeling of Stress : Only a little  Social Connections: Not on file  Intimate Partner Violence: Not At Risk (09/12/2023)   Humiliation, Afraid, Rape, and Kick questionnaire    Fear of Current or Ex-Partner: No    Emotionally Abused: No    Physically Abused: No    Sexually Abused: No    Family History  Problem Relation Age of Onset   Colon cancer Mother 42   Prostate cancer Father        dx 24s    Cancer Sister    Acute myelogenous leukemia Sister        dx 1s   Prostate cancer Brother    Melanoma Brother    Prostate cancer Brother    Melanoma Brother    HIV/AIDS Brother 53   Tuberculosis Paternal Aunt    Melanoma Other    Breast cancer Other    Melanoma Niece     Allergies  Allergen Reactions   Iodinated Contrast Media Shortness Of Breath and Other (See Comments)    Can be premedicated  Have been able to tolerate if given premeds (benadryl, prednisone)   Ibuprofen Other (See Comments)    "stomach problems"    Sulfa Antibiotics Other (See Comments)    "Stomach problems"  "Stomach problems"  "Stomach problems"  "Stomach problems"   Doxycycline Diarrhea and Nausea And Vomiting   Tetracyclines & Related Nausea Only and Nausea And Vomiting    Outpatient Medications Prior to Visit  Medication Sig   acetaminophen (TYLENOL) 500 MG tablet Take 500 mg by mouth every 6 (six) hours as needed. Once a day   alendronate (FOSAMAX) 70 MG tablet Take 1 tablet (70 mg total) by mouth every Thursday. Take with a full glass of water on an empty stomach. (Patient taking differently: Take 70 mg by mouth every Tuesday. Take with a full glass of water on an empty stomach.)   allopurinol (ZYLOPRIM) 100 MG tablet Take 100 mg by mouth daily.   azelastine (ASTELIN) 0.1 % nasal spray Place 2 sprays into both nostrils at bedtime. Use in each nostril as directed (Patient taking differently: Place 2 sprays into both nostrils daily as needed for allergies or rhinitis. Use in each nostril as directed)   Cranberry 125 MG TABS Take 125 mg by mouth daily.   cyanocobalamin (VITAMIN B12) 1000 MCG tablet Take 1,000 mcg by mouth daily. Monday to Friday   EPINEPHrine 0.3 mg/0.3 mL IJ SOAJ injection Inject 0.3 mg into the muscle as needed.   fluticasone (FLONASE) 50 MCG/ACT nasal spray Place 2 sprays into both nostrils daily as needed for allergies or rhinitis.   gabapentin (NEURONTIN) 800 MG tablet TAKE 1  TABLET FOUR TIMES DAILY   hydrALAZINE (APRESOLINE) 25 MG tablet TAKE 1 TABLET TWICE DAILY   isosorbide mononitrate (IMDUR) 60 MG 24 hr tablet TAKE 1 TABLET EVERY DAY (Patient taking differently: Take 60  mg by mouth at bedtime.)   levocetirizine (XYZAL) 5 MG tablet Take 1 tablet (5 mg total) by mouth every evening.   losartan (COZAAR) 25 MG tablet TAKE 1 TABLET EVERY DAY   meclizine (ANTIVERT) 12.5 MG tablet Take 1 tablet (12.5 mg total) by mouth 3 (three) times daily as needed for dizziness.   metoprolol succinate (TOPROL-XL) 25 MG 24 hr tablet TAKE 1 TABLET EVERY DAY (Patient taking differently: Take 25 mg by mouth at bedtime.)   nitroGLYCERIN (NITROSTAT) 0.4 MG SL tablet Place 0.4 mg under the tongue every 5 (five) minutes as needed for chest pain.   pantoprazole (PROTONIX) 40 MG tablet TAKE 1 TABLET EVERY DAY   Polyethyl Glyc-Propyl Glyc PF (SYSTANE HYDRATION PF) 0.4-0.3 % SOLN Apply 1 drop to eye daily as needed (dry eyes).   rosuvastatin (CRESTOR) 40 MG tablet TAKE 1 TABLET EVERY DAY (Patient taking differently: Take 40 mg by mouth at bedtime.)   senna-docusate (SENOKOT-S) 8.6-50 MG tablet Take 1 tablet by mouth at bedtime as needed for mild constipation.   sertraline (ZOLOFT) 25 MG tablet Take 25 mg by mouth 3 (three) times daily.   Vitamin D, Ergocalciferol, 50000 units CAPS TAKE 1 CAPSULE BY MOUTH ONCE WEEKLY. (Patient taking differently: Take 1 capsule by mouth once a week. On Friday)   warfarin (COUMADIN) 3 MG tablet Take 1 tablet (3 mg total) by mouth daily.   No facility-administered medications prior to visit.    Review of Systems  Constitutional: Negative.   HENT: Negative.    Eyes: Negative.   Respiratory: Negative.  Negative for shortness of breath.   Cardiovascular: Negative.  Negative for chest pain.  Gastrointestinal: Negative.  Negative for abdominal pain, constipation and diarrhea.  Genitourinary: Negative.   Musculoskeletal:  Negative for joint pain and myalgias.   Skin: Negative.   Neurological: Negative.  Negative for dizziness and headaches.  Endo/Heme/Allergies: Negative.   All other systems reviewed and are negative.      Objective:   BP 134/68 (BP Location: Right Arm, Patient Position: Sitting, Cuff Size: Small)   Pulse 72   Ht 5\' 11"  (1.803 m)   Wt 191 lb 6.4 oz (86.8 kg)   SpO2 96%   BMI 26.69 kg/m   Vitals:   09/23/23 1012 09/23/23 1041  BP: (!) 182/74 134/68  Pulse: 72   Height: 5\' 11"  (1.803 m)   Weight: 191 lb 6.4 oz (86.8 kg)   SpO2: 96%   BMI (Calculated): 26.71     Physical Exam Vitals and nursing note reviewed.  Constitutional:      Appearance: Normal appearance. She is normal weight.  HENT:     Head: Normocephalic and atraumatic.     Nose: Nose normal.     Mouth/Throat:     Mouth: Mucous membranes are moist.  Eyes:     Extraocular Movements: Extraocular movements intact.     Conjunctiva/sclera: Conjunctivae normal.     Pupils: Pupils are equal, round, and reactive to light.  Cardiovascular:     Rate and Rhythm: Normal rate and regular rhythm.     Pulses: Normal pulses.     Heart sounds: Normal heart sounds.  Pulmonary:     Effort: Pulmonary effort is normal.     Breath sounds: Normal breath sounds.  Abdominal:     General: Abdomen is flat. Bowel sounds are normal.     Palpations: Abdomen is soft.  Musculoskeletal:        General: Normal range of motion.  Cervical back: Normal range of motion.  Skin:    General: Skin is warm and dry.  Neurological:     General: No focal deficit present.     Mental Status: She is alert and oriented to person, place, and time.  Psychiatric:        Mood and Affect: Mood normal.        Behavior: Behavior normal.        Thought Content: Thought content normal.        Judgment: Judgment normal.      No results found for any visits on 09/23/23.  Recent Results (from the past 2160 hours)  Protime-INR     Status: Abnormal   Collection Time: 07/04/23  9:41 AM   Result Value Ref Range   INR 3.2 (H) 0.9 - 1.2    Comment: Reference interval is for non-anticoagulated patients. Suggested INR therapeutic range for Vitamin K antagonist therapy:    Standard Dose (moderate intensity                   therapeutic range):       2.0 - 3.0    Higher intensity therapeutic range       2.5 - 3.5    Prothrombin Time 32.4 (H) 9.1 - 12.0 sec  Hemoglobin A1c     Status: None   Collection Time: 07/12/23  8:57 AM  Result Value Ref Range   Hgb A1c MFr Bld 5.6 4.8 - 5.6 %    Comment:          Prediabetes: 5.7 - 6.4          Diabetes: >6.4          Glycemic control for adults with diabetes: <7.0    Est. average glucose Bld gHb Est-mCnc 114 mg/dL  TSH     Status: None   Collection Time: 07/12/23  8:57 AM  Result Value Ref Range   TSH 1.010 0.450 - 4.500 uIU/mL  CMP14+EGFR     Status: Abnormal   Collection Time: 07/12/23  8:57 AM  Result Value Ref Range   Glucose 89 70 - 99 mg/dL   BUN 19 8 - 27 mg/dL   Creatinine, Ser 1.61 0.57 - 1.00 mg/dL   eGFR 63 >09 UE/AVW/0.98   BUN/Creatinine Ratio 21 12 - 28   Sodium 144 134 - 144 mmol/L   Potassium 5.4 (H) 3.5 - 5.2 mmol/L   Chloride 104 96 - 106 mmol/L   CO2 24 20 - 29 mmol/L   Calcium 9.6 8.7 - 10.3 mg/dL   Total Protein 5.7 (L) 6.0 - 8.5 g/dL   Albumin 3.9 3.7 - 4.7 g/dL   Globulin, Total 1.8 1.5 - 4.5 g/dL   Bilirubin Total 0.3 0.0 - 1.2 mg/dL   Alkaline Phosphatase 65 44 - 121 IU/L   AST 14 0 - 40 IU/L   ALT 8 0 - 32 IU/L  Lipid panel     Status: None   Collection Time: 07/12/23  8:57 AM  Result Value Ref Range   Cholesterol, Total 148 100 - 199 mg/dL   Triglycerides 119 0 - 149 mg/dL   HDL 64 >14 mg/dL   VLDL Cholesterol Cal 19 5 - 40 mg/dL   LDL Chol Calc (NIH) 65 0 - 99 mg/dL   Chol/HDL Ratio 2.3 0.0 - 4.4 ratio    Comment:  T. Chol/HDL Ratio                                             Men  Women                               1/2 Avg.Risk  3.4    3.3                                    Avg.Risk  5.0    4.4                                2X Avg.Risk  9.6    7.1                                3X Avg.Risk 23.4   11.0   Protime-INR     Status: Abnormal   Collection Time: 07/27/23 10:00 AM  Result Value Ref Range   INR 4.8 (H) 0.9 - 1.2    Comment: Reference interval is for non-anticoagulated patients. Suggested INR therapeutic range for Vitamin K antagonist therapy:    Standard Dose (moderate intensity                   therapeutic range):       2.0 - 3.0    Higher intensity therapeutic range       2.5 - 3.5    Prothrombin Time 47.5 (H) 9.1 - 12.0 sec  Iron and TIBC     Status: None   Collection Time: 08/05/23 11:37 AM  Result Value Ref Range   Iron 58 28 - 170 ug/dL   TIBC 324 401 - 027 ug/dL   Saturation Ratios 21 10.4 - 31.8 %   UIBC 219 ug/dL    Comment: Performed at [facility], [address] [place], [place] [address]  Ferritin     Status: None   Collection Time: 08/05/23 11:37 AM  Result Value Ref Range   Ferritin 23 11 - 307 ng/mL    Comment: Performed at [facility], [address] [place], [place] [address]  Hepatic function panel     Status: Abnormal   Collection Time: 08/05/23 11:37 AM  Result Value Ref Range   Total Protein 6.2 (L) 6.5 - 8.1 g/dL   Albumin 3.7 3.5 - 5.0 g/dL   AST 15 15 - 41 U/L   ALT 10 0 - 44 U/L   Alkaline Phosphatase 58 38 - 126 U/L   Total Bilirubin 1.0 0.3 - 1.2 mg/dL   Bilirubin, Direct 0.2 0.0 - 0.2 mg/dL   Indirect Bilirubin 0.8 0.3 - 0.9 mg/dL    Comment: Performed at [facility], [address] [place], [place] [address]  CBC with Differential/Platelet     Status: Abnormal   Collection Time: 08/05/23 11:37 AM  Result Value Ref Range   WBC 5.8 4.0 - 10.5 K/uL   RBC 3.54 (L) 3.87 - 5.11 MIL/uL   Hemoglobin 10.6 (L) 12.0 - 15.0 g/dL   HCT 56.3 (L) 87.5 - 64.3 %   MCV 93.5 80.0 -  100.0 fL   MCH 29.9 26.0 - 34.0 pg   MCHC 32.0 30.0 - 36.0 g/dL   RDW 21.3 08.6  - 57.8 %   Platelets 196 150 - 400 K/uL   nRBC 0.0 0.0 - 0.2 %   Neutrophils Relative % 63 %   Neutro Abs 3.6 1.7 - 7.7 K/uL   Lymphocytes Relative 24 %   Lymphs Abs 1.4 0.7 - 4.0 K/uL   Monocytes Relative 7 %   Monocytes Absolute 0.4 0.1 - 1.0 K/uL   Eosinophils Relative 5 %   Eosinophils Absolute 0.3 0.0 - 0.5 K/uL   Basophils Relative 1 %   Basophils Absolute 0.0 0.0 - 0.1 K/uL   Immature Granulocytes 0 %   Abs Immature Granulocytes 0.02 0.00 - 0.07 K/uL    Comment: Performed at [facility], [address] [place], [place] [address]  Folate     Status: None   Collection Time: 08/05/23 11:37 AM  Result Value Ref Range   Folate 22.7 >5.9 ng/mL    Comment: Performed at [facility], [address], [place], [place] [address]  Basic Metabolic Panel - Cancer Center Only     Status: None   Collection Time: 08/05/23 11:37 AM  Result Value Ref Range   Sodium 140 135 - 145 mmol/L   Potassium 3.6 3.5 - 5.1 mmol/L   Chloride 106 98 - 111 mmol/L   CO2 28 22 - 32 mmol/L   Glucose, Bld 93 70 - 99 mg/dL    Comment: Glucose reference range applies only to samples taken after fasting for at least 8 hours.   BUN 13 8 - 23 mg/dL   Creatinine 1.32 4.40 - 1.00 mg/dL   Calcium 9.1 8.9 - 10.2 mg/dL   GFR, Estimated >72 >53 mL/min    Comment: (NOTE) Calculated using the CKD-EPI Creatinine Equation (2021)    Anion gap 6 5 - 15    Comment: Performed at [facility], [address] [place], [place] [address]  Protime-INR     Status: Abnormal   Collection Time: 08/10/23  8:37 AM  Result Value Ref Range   INR 4.5 (H) 0.9 - 1.2    Comment: Reference interval is for non-anticoagulated patients. Suggested INR therapeutic range for Vitamin K antagonist therapy:    Standard Dose (moderate intensity                   therapeutic range):       2.0 - 3.0    Higher intensity therapeutic range       2.5 - 3.5    Prothrombin Time 44.5 (H) 9.1 - 12.0 sec  Protime-INR      Status: Abnormal   Collection Time: 08/15/23  8:58 AM  Result Value Ref Range   INR 1.2 0.9 - 1.2    Comment: Reference interval is for non-anticoagulated patients. Suggested INR therapeutic range for Vitamin K antagonist therapy:    Standard Dose (moderate intensity                   therapeutic range):       2.0 - 3.0    Higher intensity therapeutic range       2.5 - 3.5    Prothrombin Time 13.0 (H) 9.1 - 12.0 sec  Comprehensive metabolic panel     Status: None   Collection Time: 08/24/23 10:25 AM  Result Value Ref Range   Sodium 141 135 - 145 mmol/L  Potassium 3.7 3.5 - 5.1 mmol/L   Chloride 109 98 - 111 mmol/L   CO2 26 22 - 32 mmol/L   Glucose, Bld 98 70 - 99 mg/dL    Comment: Glucose reference range applies only to samples taken after fasting for at least 8 hours.   BUN 14 8 - 23 mg/dL   Creatinine, Ser 0.98 0.44 - 1.00 mg/dL   Calcium 9.4 8.9 - 11.9 mg/dL   Total Protein 6.5 6.5 - 8.1 g/dL   Albumin 3.7 3.5 - 5.0 g/dL   AST 19 15 - 41 U/L   ALT 12 0 - 44 U/L   Alkaline Phosphatase 62 38 - 126 U/L   Total Bilirubin 0.7 <1.2 mg/dL   GFR, Estimated >14 >78 mL/min    Comment: (NOTE) Calculated using the CKD-EPI Creatinine Equation (2021)    Anion gap 6 5 - 15    Comment: Performed at [facility], [address], [place], [place] [address]  Troponin I (High Sensitivity)     Status: None   Collection Time: 08/24/23 10:25 AM  Result Value Ref Range   Troponin I (High Sensitivity) 7 <18 ng/L    Comment: (NOTE) Elevated high sensitivity troponin I (hsTnI) values and significant  changes across serial measurements may suggest ACS but many other  chronic and acute conditions are known to elevate hsTnI results.  Refer to the "Links" section for chest pain algorithms and additional  guidance. Performed at [facility], [address] [place], [place] [address]   CBC with Differential     Status: Abnormal   Collection Time: 08/24/23 10:25 AM  Result  Value Ref Range   WBC 5.4 4.0 - 10.5 K/uL   RBC 3.73 (L) 3.87 - 5.11 MIL/uL   Hemoglobin 11.1 (L) 12.0 - 15.0 g/dL   HCT 57.8 (L) 46.9 - 62.9 %   MCV 90.6 80.0 - 100.0 fL   MCH 29.8 26.0 - 34.0 pg   MCHC 32.8 30.0 - 36.0 g/dL   RDW 52.8 41.3 - 24.4 %   Platelets 201 150 - 400 K/uL   nRBC 0.0 0.0 - 0.2 %   Neutrophils Relative % 65 %   Neutro Abs 3.5 1.7 - 7.7 K/uL   Lymphocytes Relative 23 %   Lymphs Abs 1.2 0.7 - 4.0 K/uL   Monocytes Relative 7 %   Monocytes Absolute 0.4 0.1 - 1.0 K/uL   Eosinophils Relative 4 %   Eosinophils Absolute 0.2 0.0 - 0.5 K/uL   Basophils Relative 1 %   Basophils Absolute 0.0 0.0 - 0.1 K/uL   Immature Granulocytes 0 %   Abs Immature Granulocytes 0.02 0.00 - 0.07 K/uL    Comment: Performed at [facility], [address] [place], [place] [address]  Urinalysis, Routine w reflex microscopic -Urine, Unspecified Source     Status: Abnormal   Collection Time: 08/24/23 10:25 AM  Result Value Ref Range   Color, Urine STRAW (A) YELLOW   APPearance CLEAR (A) CLEAR   Specific Gravity, Urine 1.004 (L) 1.005 - 1.030   pH 8.0 5.0 - 8.0   Glucose, UA NEGATIVE NEGATIVE mg/dL   Hgb urine dipstick NEGATIVE NEGATIVE   Bilirubin Urine NEGATIVE NEGATIVE   Ketones, ur NEGATIVE NEGATIVE mg/dL   Protein, ur NEGATIVE NEGATIVE mg/dL   Nitrite NEGATIVE NEGATIVE   Leukocytes,Ua TRACE (A) NEGATIVE   RBC / HPF 0 0 - 5 RBC/hpf   WBC, UA 0-5 0 - 5 WBC/hpf  Bacteria, UA RARE (A) NONE SEEN   Squamous Epithelial / HPF 0-5 0 - 5 /HPF    Comment: Performed at [facility], [address] [place], [place] [address]  Troponin I (High Sensitivity)     Status: None   Collection Time: 08/24/23 11:49 AM  Result Value Ref Range   Troponin I (High Sensitivity) 7 <18 ng/L    Comment: (NOTE) Elevated high sensitivity troponin I (hsTnI) values and significant  changes across serial measurements may suggest ACS but many other  chronic and acute conditions are known  to elevate hsTnI results.  Refer to the "Links" section for chest pain algorithms and additional  guidance. Performed at [facility], [address] [place], [place] [address]   Protime-INR     Status: Abnormal   Collection Time: 08/24/23 11:49 AM  Result Value Ref Range   Prothrombin Time 28.0 (H) 11.4 - 15.2 seconds   INR 2.6 (H) 0.8 - 1.2    Comment: (NOTE) INR goal varies based on device and disease states. Performed at [facility], [address] [place], [place] [address]   Lactic acid, plasma     Status: None   Collection Time: 08/30/23 11:47 AM  Result Value Ref Range   Lactic Acid, Venous 0.7 0.5 - 1.9 mmol/L    Comment: Performed at [facility], [address] [place], [place] [address]  Comprehensive metabolic panel     Status: Abnormal   Collection Time: 08/30/23 11:47 AM  Result Value Ref Range   Sodium 139 135 - 145 mmol/L   Potassium 4.3 3.5 - 5.1 mmol/L   Chloride 107 98 - 111 mmol/L   CO2 27 22 - 32 mmol/L   Glucose, Bld 91 70 - 99 mg/dL    Comment: Glucose reference range applies only to samples taken after fasting for at least 8 hours.   BUN 18 8 - 23 mg/dL   Creatinine, Ser 6.01 0.44 - 1.00 mg/dL   Calcium 9.0 8.9 - 09.3 mg/dL   Total Protein 6.3 (L) 6.5 - 8.1 g/dL   Albumin 3.5 3.5 - 5.0 g/dL   AST 16 15 - 41 U/L   ALT 12 0 - 44 U/L   Alkaline Phosphatase 61 38 - 126 U/L   Total Bilirubin 0.7 <1.2 mg/dL   GFR, Estimated >23 >55 mL/min    Comment: (NOTE) Calculated using the CKD-EPI Creatinine Equation (2021)    Anion gap 5 5 - 15    Comment: Performed at [facility], [address] [place], [place] [address]  CBC with Differential     Status: Abnormal   Collection Time: 08/30/23 11:47 AM  Result Value Ref Range   WBC 7.1 4.0 - 10.5 K/uL   RBC 3.49 (L) 3.87 - 5.11 MIL/uL   Hemoglobin 10.3 (L) 12.0 - 15.0 g/dL   HCT 25.4 (L) 27.0 - 62.3 %   MCV 94.6 80.0 - 100.0 fL   MCH 29.5 26.0 - 34.0 pg   MCHC  31.2 30.0 - 36.0 g/dL   RDW 76.2 83.1 - 51.7 %   Platelets 225 150 - 400 K/uL   nRBC 0.0 0.0 - 0.2 %   Neutrophils Relative % 61 %   Neutro Abs 4.4 1.7 - 7.7 K/uL   Lymphocytes Relative 25 %   Lymphs Abs 1.8 0.7 - 4.0 K/uL   Monocytes Relative 8 %   Monocytes Absolute 0.5 0.1 - 1.0 K/uL   Eosinophils Relative 5 %  Eosinophils Absolute 0.3 0.0 - 0.5 K/uL   Basophils Relative 1 %   Basophils Absolute 0.1 0.0 - 0.1 K/uL   Immature Granulocytes 0 %   Abs Immature Granulocytes 0.02 0.00 - 0.07 K/uL    Comment: Performed at [facility], [address] [place], [place] [address]  Protime-INR     Status: Abnormal   Collection Time: 08/30/23  6:18 PM  Result Value Ref Range   Prothrombin Time 40.2 (H) 11.4 - 15.2 seconds   INR 4.1 (HH) 0.8 - 1.2    Comment: REPEATED TO VERIFY CRITICAL RESULT CALLED TO, READ BACK BY AND VERIFIED WITH: ALEX OLIDEN AT 1915 ON 08/30/23 BY SS (NOTE) INR goal varies based on device and disease states. Performed at [facility], [address] [place], [place] [address]   Basic metabolic panel     Status: None   Collection Time: 08/31/23  5:19 AM  Result Value Ref Range   Sodium 137 135 - 145 mmol/L   Potassium 4.1 3.5 - 5.1 mmol/L   Chloride 105 98 - 111 mmol/L   CO2 25 22 - 32 mmol/L   Glucose, Bld 95 70 - 99 mg/dL    Comment: Glucose reference range applies only to samples taken after fasting for at least 8 hours.   BUN 18 8 - 23 mg/dL   Creatinine, Ser 2.95 0.44 - 1.00 mg/dL   Calcium 9.1 8.9 - 18.8 mg/dL   GFR, Estimated >41 >66 mL/min    Comment: (NOTE) Calculated using the CKD-EPI Creatinine Equation (2021)    Anion gap 7 5 - 15    Comment: Performed at [facility], [address] [place], [place] [address]  CBC     Status: Abnormal   Collection Time: 08/31/23  5:19 AM  Result Value Ref Range   WBC 7.1 4.0 - 10.5 K/uL   RBC 3.43 (L) 3.87 - 5.11 MIL/uL   Hemoglobin 10.1 (L) 12.0 - 15.0 g/dL   HCT 60.1  (L) 09.3 - 46.0 %   MCV 89.8 80.0 - 100.0 fL   MCH 29.4 26.0 - 34.0 pg   MCHC 32.8 30.0 - 36.0 g/dL   RDW 23.5 57.3 - 22.0 %   Platelets 220 150 - 400 K/uL   nRBC 0.0 0.0 - 0.2 %    Comment: Performed at [facility], [address] [place], [place] [address]  Protime-INR     Status: Abnormal   Collection Time: 08/31/23  5:19 AM  Result Value Ref Range   Prothrombin Time 38.5 (H) 11.4 - 15.2 seconds   INR 3.9 (H) 0.8 - 1.2    Comment: (NOTE) INR goal varies based on device and disease states. Performed at [facility], [address] [place], [place] [address]   Protime-INR     Status: Abnormal   Collection Time: 09/01/23  3:09 AM  Result Value Ref Range   Prothrombin Time 32.3 (H) 11.4 - 15.2 seconds   INR 3.1 (H) 0.8 - 1.2    Comment: (NOTE) INR goal varies based on device and disease states. Performed at [facility], [address] [place], [place] [address]   CBC with Differential/Platelet     Status: Abnormal   Collection Time: 09/01/23  3:09 AM  Result Value Ref Range   WBC 6.6 4.0 - 10.5 K/uL   RBC 3.33 (L) 3.87 - 5.11 MIL/uL   Hemoglobin 9.8 (L) 12.0 - 15.0 g/dL   HCT 51.7 (L)  36.0 - 46.0 %   MCV 89.2 80.0 - 100.0 fL   MCH 29.4 26.0 - 34.0 pg   MCHC 33.0 30.0 - 36.0 g/dL   RDW 29.5 62.1 - 30.8 %   Platelets 207 150 - 400 K/uL   nRBC 0.0 0.0 - 0.2 %   Neutrophils Relative % 57 %   Neutro Abs 3.8 1.7 - 7.7 K/uL   Lymphocytes Relative 29 %   Lymphs Abs 1.9 0.7 - 4.0 K/uL   Monocytes Relative 8 %   Monocytes Absolute 0.5 0.1 - 1.0 K/uL   Eosinophils Relative 5 %   Eosinophils Absolute 0.3 0.0 - 0.5 K/uL   Basophils Relative 1 %   Basophils Absolute 0.0 0.0 - 0.1 K/uL   Immature Granulocytes 0 %   Abs Immature Granulocytes 0.02 0.00 - 0.07 K/uL    Comment: Performed at [facility], [address] [place], [place] [address]  Basic metabolic panel     Status: None   Collection Time: 09/01/23  3:09 AM  Result Value  Ref Range   Sodium 138 135 - 145 mmol/L   Potassium 4.2 3.5 - 5.1 mmol/L   Chloride 103 98 - 111 mmol/L   CO2 28 22 - 32 mmol/L   Glucose, Bld 99 70 - 99 mg/dL    Comment: Glucose reference range applies only to samples taken after fasting for at least 8 hours.   BUN 17 8 - 23 mg/dL   Creatinine, Ser 6.96 0.44 - 1.00 mg/dL   Calcium 9.0 8.9 - 29.5 mg/dL   GFR, Estimated >28 >41 mL/min    Comment: (NOTE) Calculated using the CKD-EPI Creatinine Equation (2021)    Anion gap 7 5 - 15    Comment: Performed at [facility], [address] [place], [place] [address]  Protime-INR     Status: Abnormal   Collection Time: 09/02/23  5:04 AM  Result Value Ref Range   Prothrombin Time 28.0 (H) 11.4 - 15.2 seconds   INR 2.6 (H) 0.8 - 1.2    Comment: (NOTE) INR goal varies based on device and disease states. Performed at [facility], [address] [place], [place] [address]   CBC with Differential/Platelet     Status: Abnormal   Collection Time: 09/02/23  5:04 AM  Result Value Ref Range   WBC 6.3 4.0 - 10.5 K/uL   RBC 3.41 (L) 3.87 - 5.11 MIL/uL   Hemoglobin 10.1 (L) 12.0 - 15.0 g/dL   HCT 53.6 (L) 64.4 - 03.4 %   MCV 92.1 80.0 - 100.0 fL   MCH 29.6 26.0 - 34.0 pg   MCHC 32.2 30.0 - 36.0 g/dL   RDW 74.2 59.5 - 63.8 %   Platelets 212 150 - 400 K/uL   nRBC 0.0 0.0 - 0.2 %   Neutrophils Relative % 56 %   Neutro Abs 3.5 1.7 - 7.7 K/uL   Lymphocytes Relative 29 %   Lymphs Abs 1.8 0.7 - 4.0 K/uL   Monocytes Relative 7 %   Monocytes Absolute 0.5 0.1 - 1.0 K/uL   Eosinophils Relative 7 %   Eosinophils Absolute 0.4 0.0 - 0.5 K/uL   Basophils Relative 1 %   Basophils Absolute 0.1 0.0 - 0.1 K/uL   Immature Granulocytes 0 %   Abs Immature Granulocytes 0.02 0.00 - 0.07 K/uL    Comment: Performed at [facility], [address] [place], [place] [address]  Basic metabolic panel  Status: None   Collection Time: 09/02/23  5:04 AM  Result Value Ref Range    Sodium 138 135 - 145 mmol/L   Potassium 4.0 3.5 - 5.1 mmol/L   Chloride 104 98 - 111 mmol/L   CO2 26 22 - 32 mmol/L   Glucose, Bld 95 70 - 99 mg/dL    Comment: Glucose reference range applies only to samples taken after fasting for at least 8 hours.   BUN 20 8 - 23 mg/dL   Creatinine, Ser 1.61 0.44 - 1.00 mg/dL   Calcium 9.2 8.9 - 09.6 mg/dL   GFR, Estimated >04 >54 mL/min    Comment: (NOTE) Calculated using the CKD-EPI Creatinine Equation (2021)    Anion gap 8 5 - 15    Comment: Performed at [facility], [address] [place], [place] [address]      Assessment & Plan:  Continue same medications.   Problem List Items Addressed This Visit       Cardiovascular and Mediastinum   Hypertension     Other   Encounter for annual health examination - Primary    Return if symptoms worsen or fail to improve, for keep February appt.   Total time spent: 25 minutes  [employer], NP  09/23/2023   This document may have been prepared by Dragon Voice Recognition software and as such may include unintentional dictation errors.

## 2023-09-24 LAB — CMP14+EGFR
ALT: 10 [IU]/L (ref 0–32)
AST: 17 [IU]/L (ref 0–40)
Albumin: 3.8 g/dL (ref 3.7–4.7)
Alkaline Phosphatase: 81 [IU]/L (ref 44–121)
BUN/Creatinine Ratio: 16 (ref 12–28)
BUN: 13 mg/dL (ref 8–27)
Bilirubin Total: 0.4 mg/dL (ref 0.0–1.2)
CO2: 24 mmol/L (ref 20–29)
Calcium: 9.2 mg/dL (ref 8.7–10.3)
Chloride: 106 mmol/L (ref 96–106)
Creatinine, Ser: 0.83 mg/dL (ref 0.57–1.00)
Globulin, Total: 1.8 g/dL (ref 1.5–4.5)
Glucose: 85 mg/dL (ref 70–99)
Potassium: 4.4 mmol/L (ref 3.5–5.2)
Sodium: 143 mmol/L (ref 134–144)
Total Protein: 5.6 g/dL — ABNORMAL LOW (ref 6.0–8.5)
eGFR: 70 mL/min/{1.73_m2} (ref >59)

## 2023-09-24 LAB — TSH: TSH: 1.04 u[IU]/mL (ref 0.450–4.500)

## 2023-09-24 LAB — LIPID PANEL
Chol/HDL Ratio: 2.3 {ratio} (ref 0.0–4.4)
Cholesterol, Total: 128 mg/dL (ref 100–199)
HDL: 55 mg/dL (ref >39)
LDL Chol Calc (NIH): 50 mg/dL (ref 0–99)
Triglycerides: 130 mg/dL (ref 0–149)
VLDL Cholesterol Cal: 23 mg/dL (ref 5–40)

## 2023-09-24 LAB — HEMOGLOBIN A1C
Est. average glucose Bld gHb Est-mCnc: 108 mg/dL
Hgb A1c MFr Bld: 5.4 % (ref 4.8–5.6)

## 2023-09-26 ENCOUNTER — Encounter: Payer: Self-pay | Admitting: Internal Medicine

## 2023-09-26 ENCOUNTER — Encounter: Payer: Self-pay | Admitting: Medical Oncology

## 2023-09-26 ENCOUNTER — Other Ambulatory Visit: Payer: Self-pay

## 2023-09-26 ENCOUNTER — Other Ambulatory Visit: Payer: Self-pay | Admitting: Internal Medicine

## 2023-09-26 ENCOUNTER — Emergency Department
Admission: EM | Admit: 2023-09-26 | Discharge: 2023-09-26 | Disposition: A | Discharge status: Home / Self Care | Payer: Medicare PPO | Attending: Emergency Medicine | Admitting: Emergency Medicine

## 2023-09-26 DIAGNOSIS — R791 Abnormal coagulation profile: Secondary | ICD-10-CM | POA: Insufficient documentation

## 2023-09-26 DIAGNOSIS — Z7901 Long term (current) use of anticoagulants: Secondary | ICD-10-CM | POA: Insufficient documentation

## 2023-09-26 LAB — CBC WITH DIFFERENTIAL/PLATELET
Abs Immature Granulocytes: 0.02 10*3/uL (ref 0.00–0.07)
Basophils Absolute: 0.1 10*3/uL (ref 0.0–0.1)
Basophils Relative: 1 %
Eosinophils Absolute: 0.3 10*3/uL (ref 0.0–0.5)
Eosinophils Relative: 5 %
HCT: 33.6 % — ABNORMAL LOW (ref 36.0–46.0)
Hemoglobin: 10.5 g/dL — ABNORMAL LOW (ref 12.0–15.0)
Immature Granulocytes: 0 %
Lymphocytes Relative: 27 %
Lymphs Abs: 1.5 10*3/uL (ref 0.7–4.0)
MCH: 29.9 pg (ref 26.0–34.0)
MCHC: 31.3 g/dL (ref 30.0–36.0)
MCV: 95.7 fL (ref 80.0–100.0)
Monocytes Absolute: 0.4 10*3/uL (ref 0.1–1.0)
Monocytes Relative: 7 %
Neutro Abs: 3.5 10*3/uL (ref 1.7–7.7)
Neutrophils Relative %: 60 %
Platelets: 198 10*3/uL (ref 150–400)
RBC: 3.51 MIL/uL — ABNORMAL LOW (ref 3.87–5.11)
RDW: 13.3 % (ref 11.5–15.5)
WBC: 5.7 10*3/uL (ref 4.0–10.5)
nRBC: 0 % (ref 0.0–0.2)

## 2023-09-26 LAB — COMPREHENSIVE METABOLIC PANEL
ALT: 14 U/L (ref 0–44)
AST: 21 U/L (ref 15–41)
Albumin: 3.8 g/dL (ref 3.5–5.0)
Alkaline Phosphatase: 64 U/L (ref 38–126)
Anion gap: 5 (ref 5–15)
BUN: 15 mg/dL (ref 8–23)
CO2: 29 mmol/L (ref 22–32)
Calcium: 9.2 mg/dL (ref 8.9–10.3)
Chloride: 108 mmol/L (ref 98–111)
Creatinine, Ser: 0.73 mg/dL (ref 0.44–1.00)
GFR, Estimated: >60 mL/min (ref >60)
Glucose, Bld: 107 mg/dL — ABNORMAL HIGH (ref 70–99)
Potassium: 3.7 mmol/L (ref 3.5–5.1)
Sodium: 142 mmol/L (ref 135–145)
Total Bilirubin: 0.6 mg/dL (ref <1.2)
Total Protein: 6.5 g/dL (ref 6.5–8.1)

## 2023-09-26 LAB — PROTIME-INR
INR: 8.7 (ref 0.9–1.2)
INR: 6.1 (ref 0.8–1.2)
Prothrombin Time: 81.8 s — ABNORMAL HIGH (ref 9.1–12.0)
Prothrombin Time: 54.7 s — ABNORMAL HIGH (ref 11.4–15.2)

## 2023-09-26 LAB — APTT: aPTT: 101 s — ABNORMAL HIGH (ref 24–36)

## 2023-09-26 NOTE — ED Provider Notes (Signed)
   [facility] Provider Note    Event Date/Time   First MD Initiated Contact with Patient 09/26/23 1001     (approximate)  History   Chief Complaint: abnormal labs  HPI  Sarah Riddle is a 82 y.o. female with a past medical history of atrial fibrillation status post ablation, prior DVT on Coumadin who presents to the emergency department for an elevated INR.  Patient states she had lab work performed on Friday showing an elevated INR and she got a call this morning asking her to go to the hospital for recheck and treatment.  Per my record review on 09/23/2023 patient had an INR of 8.7.  Patient has a goal INR between 2 and 3.  Patient denies any bleeding no black or bloody stool.  Patient has no physical complaints.  Physical Exam   Triage Vital Signs: ED Triage Vitals [09/26/23 0949]  Encounter Vitals Group     BP (!) 174/81     Systolic BP Percentile      Diastolic BP Percentile      Pulse Rate 72     Resp 16     Temp 98.1 F (36.7 C)     Temp Source Oral     SpO2 97 %     Weight 189 lb 9.5 oz (86 kg)     Height 5\' 11"  (1.803 m)     Head Circumference      Peak Flow      Pain Score 0     Pain Loc      Pain Education      Exclude from Growth Chart     Most recent vital signs: Vitals:   09/26/23 0949  BP: (!) 174/81  Pulse: 72  Resp: 16  Temp: 98.1 F (36.7 C)  SpO2: 97%    General: Awake, no distress.  CV:  Good peripheral perfusion.  Regular rate and rhythm  Resp:  Normal effort.  Equal breath sounds bilaterally.  Abd:  No distention.  Soft, nontender.  No rebound or guarding.  ED Results / Procedures / Treatments   MEDICATIONS ORDERED IN ED: Medications - No data to display   IMPRESSION / MDM / ASSESSMENT AND PLAN / ED COURSE  I reviewed the triage vital signs and the nursing notes.  Patient's presentation is most consistent with acute presentation with potential threat to life or bodily function.  Patient presents to the  emergency department for an elevated INR reportedly 8.7 on care everywhere taken 09/23/2023.  Patient denies any bleeding.  We will recheck the patient's INR today, depending on the level we will treat with oral vitamin K versus discharge home with instructions to skip several days if needed.  Patient agreeable to this plan of care.  Patient's labs have resulted showing a reassuring CBC, reassuring chemistry.  Patient's INR is 6.1.  Given the patient has no bleeding with an INR of 6.1 and otherwise asymptomatic I have instructed the patient to hold her Coumadin for 3 days and then restart on Thursday.  Patient will follow-up with her doctor Friday or Monday for recheck of her INR level.  Patient agreeable to plan.  FINAL CLINICAL IMPRESSION(S) / ED DIAGNOSES   Supratherapeutic INR   Note:  This document was prepared using Dragon voice recognition software and may include unintentional dictation errors.   Minna Antis, MD 09/26/23 1131

## 2023-09-26 NOTE — ED Triage Notes (Signed)
Pt here for elevated INR and Vit K.

## 2023-09-26 NOTE — Discharge Instructions (Signed)
Please discontinue your Coumadin/warfarin for 3 days, restarting on Thursday.  Please follow-up with your doctor Friday or Monday for recheck of your INR level.  Return to the emergency department for any symptom concerning to yourself.

## 2023-09-27 ENCOUNTER — Encounter: Payer: Medicare PPO | Admitting: Physician Assistant

## 2023-09-27 DIAGNOSIS — I480 Paroxysmal atrial fibrillation: Secondary | ICD-10-CM | POA: Diagnosis not present

## 2023-09-27 DIAGNOSIS — G603 Idiopathic progressive neuropathy: Secondary | ICD-10-CM | POA: Diagnosis not present

## 2023-09-27 DIAGNOSIS — L97822 Non-pressure chronic ulcer of other part of left lower leg with fat layer exposed: Secondary | ICD-10-CM | POA: Diagnosis not present

## 2023-09-27 DIAGNOSIS — I87332 Chronic venous hypertension (idiopathic) with ulcer and inflammation of left lower extremity: Secondary | ICD-10-CM | POA: Diagnosis not present

## 2023-09-27 DIAGNOSIS — I872 Venous insufficiency (chronic) (peripheral): Secondary | ICD-10-CM | POA: Diagnosis not present

## 2023-09-27 DIAGNOSIS — I2510 Atherosclerotic heart disease of native coronary artery without angina pectoris: Secondary | ICD-10-CM | POA: Diagnosis not present

## 2023-09-27 DIAGNOSIS — Z7901 Long term (current) use of anticoagulants: Secondary | ICD-10-CM | POA: Diagnosis not present

## 2023-09-27 NOTE — Progress Notes (Signed)
SHANITHA, BARTNIK W ([id number]) [id number] Page 1 of 9 Visit Report for 09/27/2023 Arrival Information Details Patient Name: Date of Service: Sarah Riddle, [place] [facility] M. 09/27/2023 9:45 A M Medical Record Number: [id number] Patient Account Number: [id number] Date of Birth/Sex: Treating RN: 23-Aug-1941 (82 y.o. Sarah Riddle Primary Care Jahlisa Rossitto: Yves Dill Other Clinician: Referring Nigel Ericsson: Treating Jnaya Butrick/Extender: Luan Moore, Neelam Weeks in Treatment: 2 Visit Information History Since Last Visit Added or deleted any medications: No Patient Arrived: Ambulatory Any new allergies or adverse reactions: No Arrival Time: 09:44 Had a fall or experienced change in No Accompanied By: family activities of daily living that may affect Transfer Assistance: None risk of falls: Patient Identification Verified: Yes Hospitalized since last visit: Yes Secondary Verification Process Completed: Yes Has Dressing in Place as Prescribed: Yes Patient Requires Transmission-Based No Has Compression in Place as Prescribed: Yes Precautions: Pain Present Now: No Patient Has Alerts: Yes Patient Alerts: Patient on Blood Thinner Warfarin ABI R 1.36 L1.22 08/31/23 Notes visit to ER for INR, nothing ended up being done Electronic Signature(s) Signed: 09/27/2023 11:53:39 AM By: Angelina Pih Entered By: Angelina Pih on 09/27/2023 06:45:53 -------------------------------------------------------------------------------- Clinic Level of Care Assessment Details Patient Name: Date of Service: Sarah Eric, DO Sarah THY M. 09/27/2023 9:45 A M Medical Record Number: [id number] Patient Account Number: [id number] Date of Birth/Sex: Treating RN: October 20, 1940 (82 y.o. Sarah Riddle Primary Care Rilda Bulls: Yves Dill Other Clinician: Referring Anahi Belmar: Treating Brean Carberry/Extender: Luan Moore, Neelam Weeks in Treatment: 2 Clinic Level of Care Assessment Items TOOL 1  Quantity Score []  - 0 Use when EandM and Procedure is performed on INITIAL visit ASSESSMENTS - Nursing Assessment / Reassessment []  - 0 General Physical Exam (combine w/ comprehensive assessment (listed just below) when performed on new pt. [address], WAXMAN ([id number]) [id number] Page 2 of 9 []  - 0 Comprehensive Assessment (HX, ROS, Risk Assessments, Wounds Hx, etc.) ASSESSMENTS - Wound and Skin Assessment / Reassessment []  - 0 Dermatologic / Skin Assessment (not related to wound area) ASSESSMENTS - Ostomy and/or Continence Assessment and Care []  - 0 Incontinence Assessment and Management []  - 0 Ostomy Care Assessment and Management (repouching, etc.) PROCESS - Coordination of Care []  - 0 Simple Patient / Family Education for ongoing care []  - 0 Complex (extensive) Patient / Family Education for ongoing care []  - 0 Staff obtains Chiropractor, Records, T Results / Process Orders est []  - 0 Staff telephones HHA, Nursing Homes / Clarify orders / etc []  - 0 Routine Transfer to another Facility (non-emergent condition) []  - 0 Routine Hospital Admission (non-emergent condition) []  - 0 New Admissions / Manufacturing engineer / Ordering NPWT Apligraf, etc. , []  - 0 Emergency Hospital Admission (emergent condition) PROCESS - Special Needs []  - 0 Pediatric / Minor Patient Management []  - 0 Isolation Patient Management []  - 0 Hearing / Language / Visual special needs []  - 0 Assessment of Community assistance (transportation, D/C planning, etc.) []  - 0 Additional assistance / Altered mentation []  - 0 Support Surface(s) Assessment (bed, cushion, seat, etc.) INTERVENTIONS - Miscellaneous []  - 0 External ear exam []  - 0 Patient Transfer (multiple staff / Nurse, adult / Similar devices) []  - 0 Simple Staple / Suture removal (25 or less) []  - 0 Complex Staple / Suture removal (26 or more) []  - 0 Hypo/Hyperglycemic Management (do not check if  billed separately) []  - 0 Ankle / Brachial Index (ABI) - do not check if billed separately Has the patient been seen  at the hospital within the last three years: Yes Total Score: 0 Level Of Care: ____ Electronic Signature(s) Signed: 09/27/2023 11:53:39 AM By: Angelina Pih Entered By: Angelina Pih on 09/27/2023 07:20:38 -------------------------------------------------------------------------------- Encounter Discharge Information Details Patient Name: Date of Service: Sarah Eric, DO Sarah THY M. 09/27/2023 9:45 A M Medical Record Number: [id number] Patient Account Number: [id number] Date of Birth/Sex: Treating RN: 1941-07-20 (82 y.o. Sarah Riddle Primary Care Derryck Shahan: Yves Dill Other Clinician: SHEKELA, VAYNER ([id number]) [id number] Page 3 of 9 Referring Paloma Grange: Treating Cherrelle Plante/Extender: Luan Moore, Neelam Weeks in Treatment: 2 Encounter Discharge Information Items Post Procedure Vitals Discharge Condition: Stable Temperature (F): 98.4 Ambulatory Status: Ambulatory Pulse (bpm): 68 Discharge Destination: Home Respiratory Rate (breaths/min): 18 Transportation: Private Auto Blood Pressure (mmHg): 175/76 Accompanied By: family Schedule Follow-up Appointment: Yes Clinical Summary of Care: Electronic Signature(s) Signed: 09/27/2023 11:53:39 AM By: Angelina Pih Entered By: Angelina Pih on 09/27/2023 07:21:39 -------------------------------------------------------------------------------- Lower Extremity Assessment Details Patient Name: Date of Service: Sarah Caprice Renshaw, DO Sarah 71 M. 09/27/2023 9:45 A M Medical Record Number: [id number] Patient Account Number: [id number] Date of Birth/Sex: Treating RN: 07-29-1941 (82 y.o. Sarah Riddle Primary Care Lenora Gomes: Yves Dill Other Clinician: Referring Flois Mctague: Treating Tiara Maultsby/Extender: Luan Moore, Neelam Weeks in Treatment: 2 Edema Assessment Assessed: [Left: No] [Right:  No] Edema: [Left: Ye] [Right: s] Calf Left: Right: Point of Measurement: 34 cm From Medial Instep 38.5 cm Ankle Left: Right: Point of Measurement: 12 cm From Medial Instep 26 cm Vascular Assessment Pulses: Dorsalis Pedis Palpable: [Left:Yes] Extremity colors, hair growth, and conditions: Extremity Color: [Left:Normal] Hair Growth on Extremity: [Left:No] Temperature of Extremity: [Left:Warm < 3 seconds] Toe Nail Assessment Left: Right: Thick: Yes Discolored: Yes Deformed: No Improper Length and Hygiene: No Electronic Signature(s) Signed: 09/27/2023 11:53:39 AM By: Angelina Pih Mickel,Signed: 09/27/2023 11:53:39 AM By: Melynda Ripple ([id number]) [id number] Page 4 of 9 Entered By: Angelina Pih on 09/27/2023 06:58:31 -------------------------------------------------------------------------------- Multi Wound Chart Details Patient Name: Date of Service: Sarah Eric, DO Sarah [facility] M. 09/27/2023 9:45 A M Medical Record Number: [id number] Patient Account Number: [id number] Date of Birth/Sex: Treating RN: [birth year] (82 y.o. Sarah Riddle Primary Care Nasiya Pascual: Yves Dill Other Clinician: Referring Kalei Meda: Treating Cola Highfill/Extender: Luan Moore, Neelam Weeks in Treatment: 2 Vital Signs Height(in): 71 Pulse(bpm): 68 Weight(lbs): 190 Blood Pressure(mmHg): 175/76 Body Mass Index(BMI): 26.5 Temperature(F): 98.4 Respiratory Rate(breaths/min): 18 [1:Photos:] [N/A:N/A] Left, Anterior Lower Leg N/A N/A Wound Location: Trauma N/A N/A Wounding Event: Venous Leg Ulcer N/A N/A Primary Etiology: Sleep Apnea, Coronary Artery N/A N/A Comorbid History: Disease, Hypertension, Neuropathy 08/24/2023 N/A N/A Date Acquired: 2 N/A N/A Weeks of Treatment: Open N/A N/A Wound Status: No N/A N/A Wound Recurrence: 2.9x4.5x0.1 N/A N/A Measurements L x W x D (cm) 10.249 N/A N/A A (cm) : rea 1.025 N/A N/A Volume (cm) : 53.20% N/A  N/A % Reduction in Area: 53.20% N/A N/A % Reduction in Volume: Full Thickness Without Exposed N/A N/A Classification: Support Structures Medium N/A N/A Exudate A mount: Serosanguineous N/A N/A Exudate Type: red, brown N/A N/A Exudate Color: Distinct, outline attached N/A N/A Wound Margin: Medium (34-66%) N/A N/A Granulation Amount: Red, Pink N/A N/A Granulation Quality: Small (1-33%) N/A N/A Necrotic Amount: Eschar N/A N/A Necrotic Tissue: Fat Layer (Subcutaneous Tissue): Yes N/A N/A Exposed Structures: Fascia: No Tendon: No Muscle: No Joint: No Bone: No Medium (34-66%) N/A N/A Epithelialization: Treatment Notes DEANDRA, BRULL ([id number]) [id number] Page 5 of 9 Electronic Signature(s) Signed: 09/27/2023 11:53:39 AM By: Roger Shelter,  Caitlin Entered By: Angelina Pih on 09/27/2023 07:16:12 -------------------------------------------------------------------------------- Multi-Disciplinary Care Plan Details Patient Name: Date of Service: Sarah Eric, DO [place]. 09/27/2023 9:45 A M Medical Record Number: [id number] Patient Account Number: [id number] Date of Birth/Sex: Treating RN: Dec 22, 1940 (82 y.o. Sarah Riddle Primary Care Abraham Margulies: Yves Dill Other Clinician: Referring Mosella Kasa: Treating Osker Ayoub/Extender: Luan Moore, Neelam Weeks in Treatment: 2 Active Inactive Necrotic Tissue Nursing Diagnoses: Impaired tissue integrity related to necrotic/devitalized tissue Knowledge deficit related to management of necrotic/devitalized tissue Goals: Necrotic/devitalized tissue will be minimized in the wound bed Date Initiated: 09/13/2023 Target Resolution Date: 10/14/2023 Goal Status: Active Patient/caregiver will verbalize understanding of reason and process for debridement of necrotic tissue Date Initiated: 09/13/2023 Target Resolution Date: 10/04/2023 Goal Status: Active Interventions: Assess patient pain level pre-, during and  post procedure and prior to discharge Provide education on necrotic tissue and debridement process Treatment Activities: Apply topical anesthetic as ordered : 09/13/2023 Excisional debridement : 09/13/2023 Notes: Wound/Skin Impairment Nursing Diagnoses: Impaired tissue integrity Knowledge deficit related to ulceration/compromised skin integrity Goals: Ulcer/skin breakdown will have a volume reduction of 30% by week 4 Date Initiated: 09/13/2023 Target Resolution Date: 10/14/2023 Goal Status: Active Ulcer/skin breakdown will have a volume reduction of 50% by week 8 Date Initiated: 09/13/2023 Target Resolution Date: 11/14/2023 Goal Status: Active Ulcer/skin breakdown will have a volume reduction of 80% by week 12 Date Initiated: 09/13/2023 Target Resolution Date: 12/12/2023 Goal Status: Active Ulcer/skin breakdown will heal within 14 weeks Date Initiated: 09/13/2023 Target Resolution Date: 12/26/2023 Goal Status: Active Interventions: Assess patient/caregiver ability to obtain necessary supplies [place] ([id number]) [id number] Page 6 of 9 Assess patient/caregiver ability to perform ulcer/skin care regimen upon admission and as needed Assess ulceration(s) every visit Provide education on ulcer and skin care Treatment Activities: Skin care regimen initiated : 09/13/2023 Notes: Electronic Signature(s) Signed: 09/27/2023 11:53:39 AM By: Angelina Pih Entered By: Angelina Pih on 09/27/2023 07:20:49 -------------------------------------------------------------------------------- Pain Assessment Details Patient Name: Date of Service: Sarah Eric, DO Sarah THY M. 09/27/2023 9:45 A M Medical Record Number: [id number] Patient Account Number: [id number] Date of Birth/Sex: Treating RN: 1941/06/04 (82 y.o. Sarah Riddle Primary Care Maurie Musco: Yves Dill Other Clinician: Referring Kailynne Ferrington: Treating Shondrea Steinert/Extender: Luan Moore, Neelam Weeks  in Treatment: 2 Active Problems Location of Pain Severity and Description of Pain Patient Has Paino No Site Locations Pain Management and Medication Current Pain Management: Electronic Signature(s) Signed: 09/27/2023 11:53:39 AM By: Angelina Pih Entered By: Angelina Pih on 09/27/2023 06:47:36 Sparacino, [name] ([id number]) [id number] Page 7 of 9 -------------------------------------------------------------------------------- Patient/Caregiver Education Details Patient Name: Date of Service: Santo Held. 12/24/2024andnbsp9:45 A M Medical Record Number: [id number] Patient Account Number: [id number] Date of Birth/Gender: Treating RN: 1941/05/04 (82 y.o. Sarah Riddle Primary Care Physician: Yves Dill Other Clinician: Referring Physician: Treating Physician/Extender: Leslee Home in Treatment: 2 Education Assessment Education Provided To: Patient Education Topics Provided Wound Debridement: Handouts: Wound Debridement Methods: Explain/Verbal Responses: State content correctly Wound/Skin Impairment: Handouts: Caring for Your Ulcer Methods: Explain/Verbal Responses: State content correctly Electronic Signature(s) Signed: 09/27/2023 11:53:39 AM By: Angelina Pih Entered By: Angelina Pih on 09/27/2023 07:21:03 -------------------------------------------------------------------------------- Wound Assessment Details Patient Name: Date of Service: Sarah Eric, DO Sarah THY M. 09/27/2023 9:45 A M Medical Record Number: [id number] Patient Account Number: [id number] Date of Birth/Sex: Treating RN: 06/09/1941 (82 y.o. Sarah Riddle Primary Care Arielle Eber: Yves Dill Other Clinician: Referring Malashia Kamaka: Treating Santosh Petter/Extender: Luan Moore, Neelam Weeks in Treatment: 2 Wound  Status Wound Number: 1 Primary Venous Leg Ulcer Etiology: Wound Location: Left, Anterior Lower Leg Wound Status: Open Wounding  Event: Trauma Comorbid Sleep Apnea, Coronary Artery Disease, Hypertension, Date Acquired: 08/24/2023 History: Neuropathy Weeks Of Treatment: 2 Clustered Wound: No AZIZAH, GILES ([id number]) [id number] Page 8 of 9 Photos Wound Measurements Length: (cm) 2.9 Width: (cm) 4.5 Depth: (cm) 0.1 Area: (cm) 10.249 Volume: (cm) 1.025 % Reduction in Area: 53.2% % Reduction in Volume: 53.2% Epithelialization: Medium (34-66%) Tunneling: No Undermining: No Wound Description Classification: Full Thickness Without Exposed Suppor Wound Margin: Distinct, outline attached Exudate Amount: Medium Exudate Type: Serosanguineous Exudate Color: red, brown t Structures Foul Odor After Cleansing: No Slough/Fibrino No Wound Bed Granulation Amount: Medium (34-66%) Exposed Structure Granulation Quality: Red, Pink Fascia Exposed: No Necrotic Amount: Small (1-33%) Fat Layer (Subcutaneous Tissue) Exposed: Yes Necrotic Quality: Eschar Tendon Exposed: No Muscle Exposed: No Joint Exposed: No Bone Exposed: No Treatment Notes Wound #1 (Lower Leg) Wound Laterality: Left, Anterior Cleanser Soap and Water Discharge Instruction: Gently cleanse wound with antibacterial soap, rinse and pat dry prior to dressing wounds Vashe 5.8 (oz) Discharge Instruction: Use vashe 5.8 (oz) as directed Peri-Wound Care Topical Primary Dressing Promogran Matrix 4.34 (in) Discharge Instruction: Moisten w/normal saline or sterile water; Cover wound as directed. Do not remove from wound bed. Xeroform 4x4-HBD (in/in) Discharge Instruction: Apply Xeroform 4x4-HBD (in/in) as directed Secondary Dressing Zetuvit Plus 4x8 (in/in) Secured With Compression Wrap Urgo K2 Lite, two layer compression system, regular Compression Stockings Add-Ons Electronic Signature(s) Signed: 09/27/2023 11:53:39 AM By: Angelina Pih Entered By: Angelina Pih on 09/27/2023 06:58:00 Leclaire, [name] ([id number])  [id number] Page 9 of 9 -------------------------------------------------------------------------------- Vitals Details Patient Name: Date of Service: Sarah Eric, DO [place]. 09/27/2023 9:45 A M Medical Record Number: [id number] Patient Account Number: [id number] Date of Birth/Sex: Treating RN: 02-Jan-1941 (82 y.o. Sarah Riddle Primary Care Lexi Conaty: Yves Dill Other Clinician: Referring Dillan Candela: Treating Cordero Surette/Extender: Luan Moore, Neelam Weeks in Treatment: 2 Vital Signs Time Taken: 09:45 Temperature (F): 98.4 Height (in): 71 Pulse (bpm): 68 Weight (lbs): 190 Respiratory Rate (breaths/min): 18 Body Mass Index (BMI): 26.5 Blood Pressure (mmHg): 175/76 Reference Range: 80 - 120 mg / dl Electronic Signature(s) Signed: 09/27/2023 11:53:39 AM By: Angelina Pih Entered By: Angelina Pih on 09/27/2023 06:47:31

## 2023-09-27 NOTE — Progress Notes (Addendum)
YAEKO, ROSZAK Z ([id number]) [id number] Page 1 of 8 Visit Report for 09/27/2023 Chief Complaint Document Details Patient Name: Date of Service: Sarah Eric, DO RO [facility] M. 09/27/2023 9:45 A M Medical Record Number: [id number] Patient Account Number: [id number] Date of Birth/Sex: Treating RN: 1941/05/16 (82 y.o. Esmeralda Links Primary Care Provider: Yves Dill Other Clinician: Referring Provider: Treating Provider/Extender: Luan Moore, Neelam Weeks in Treatment: 2 Information Obtained from: Patient Chief Complaint Left LE Ulcer Electronic Signature(s) Signed: 09/27/2023 9:47:59 AM By: Allen Derry PA-C Entered By: Allen Derry on 09/27/2023 06:47:58 -------------------------------------------------------------------------------- Debridement Details Patient Name: Date of Service: [name], DO RO [facility] M. 09/27/2023 9:45 A M Medical Record Number: [id number] Patient Account Number: [id number] Date of Birth/Sex: Treating RN: Jun 08, 1941 (82 y.o. Esmeralda Links Primary Care Provider: Yves Dill Other Clinician: Referring Provider: Treating Provider/Extender: Luan Moore, Neelam Weeks in Treatment: 2 Debridement Performed for Assessment: Wound #1 Left,Anterior Lower Leg Performed By: Physician Allen Derry, PA-C The following information was scribed by: Angelina Pih The information was scribed for: Allen Derry Debridement Type: Debridement Severity of Tissue Pre Debridement: Fat layer exposed Level of Consciousness (Pre-procedure): Awake and Alert Pre-procedure Verification/Time Out Yes - 10:17 Taken: Pain Control: Lidocaine 4% T opical Solution Percent of Wound Bed Debrided: 100% T Area Debrided (cm): otal 0.01 Tissue and other material debrided: Viable, Non-Viable, Skin: Dermis , Skin: Epidermis Level: Skin/Epidermis Debridement Description: Selective/Open Wound Instrument: Curette Bleeding: Moderate Hemostasis Achieved:  Pressure TUNESIA, DETTMAN ([id number]) [id number] Page 2 of 8 Response to Treatment: Procedure was tolerated well Level of Consciousness (Post- Awake and Alert procedure): Post Debridement Measurements of Total Wound Length: (cm) 0.1 Width: (cm) 0.1 Depth: (cm) 0.1 Volume: (cm) 0.001 Character of Wound/Ulcer Post Debridement: Stable Severity of Tissue Post Debridement: Fat layer exposed Post Procedure Diagnosis Same as Pre-procedure Electronic Signature(s) Signed: 09/27/2023 11:53:39 AM By: Angelina Pih Signed: 09/27/2023 3:46:23 PM By: Allen Derry PA-C Entered By: Angelina Pih on 09/27/2023 07:22:20 -------------------------------------------------------------------------------- HPI Details Patient Name: Date of Service: Sarah Eric, DO RO [facility] M. 09/27/2023 9:45 A M Medical Record Number: [id number] Patient Account Number: [id number] Date of Birth/Sex: Treating RN: 1941-04-22 (82 y.o. Esmeralda Links Primary Care Provider: Yves Dill Other Clinician: Referring Provider: Treating Provider/Extender: Luan Moore, Neelam Weeks in Treatment: 2 History of Present Illness Chronic/Inactive Conditions Condition 1: 09-13-2023 upon evaluation today patient's ABIs on the right 1.36 on the left 1.22 and this was performed on August 31, 2023 when she was in the hospital. She seems to have good arterial flow to be able to heal the wound. HPI Description: 09-13-2023 upon evaluation today patient appears to be doing poorly currently in regard to her wound which is on the left anterior lower extremity. This is a wound that was acquired on August 24, 2023. With that being said the patient had this skin tear that occurred as a result of trauma and then subsequently was in the hospital from November 20 until November 29. Xeroform has been used along with border foam dressings. She is on Coumadin. With that being said she did have ABIs done and they are  noted above. Patient does have a history of chronic venous hypertension, neuropathy, atrial fibrillation, coronary artery disease, and she is on long-term anticoagulant therapy specifically Coumadin. 09-20-2023 upon evaluation patient appears to be doing better in regard to the wound. Overall I do believe that we are seeing signs of improvement and in fact the wound is measuring smaller. I am very pleased  in that regard and I think that she is making good headway here. 09-27-23 on evaluation patient's wound actually appears to be doing quite well today. Fortunately, I think that she is actually just about healed. There is a lot of dry skin around the edge of the wound. I'm gonna clear that away today. Electronic Signature(s) Signed: 09/27/2023 3:46:23 PM By: Allen Derry PA-C Entered By: Allen Derry on 09/27/2023 12:16:18 Vital, [name] ([id number]) [id number] Page 3 of 8 -------------------------------------------------------------------------------- Physical Exam Details Patient Name: Date of Service: Sarah Eric, DO RO [facility] M. 09/27/2023 9:45 A M Medical Record Number: [id number] Patient Account Number: [id number] Date of Birth/Sex: Treating RN: [birth year] (82 y.o. Esmeralda Links Primary Care Provider: Yves Dill Other Clinician: Referring Provider: Treating Provider/Extender: Luan Moore, Neelam Weeks in Treatment: 2 Constitutional Well-nourished and well-hydrated in no acute distress. Respiratory normal breathing without difficulty. Psychiatric this patient is able to make decisions and demonstrates good insight into disease process. Alert and Oriented x 3. pleasant and cooperative. Notes Patient's wound bed showed signs of good granulation and epithelization at this point. I do not see any signs of infection and in fact, I think there's just a very tiny area remaining open at this point. She will likely be healed next week. Electronic  Signature(s) Signed: 09/27/2023 3:46:23 PM By: Allen Derry PA-C Entered By: Allen Derry on 09/27/2023 12:16:52 -------------------------------------------------------------------------------- Physician Orders Details Patient Name: Date of Service: [name], DO RO [facility] M. 09/27/2023 9:45 A M Medical Record Number: [id number] Patient Account Number: [id number] Date of Birth/Sex: Treating RN: [birth year] (82 y.o. 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 Nurse Visit as needed - PRN Bathing/ Shower/ Hygiene May shower with wound dressing protected with water repellent cover or cast protector. No tub bath. Anesthetic (Use 'Patient Medications' Section for Anesthetic Order Entry) Lidocaine applied to wound bed Edema Control - Orders / Instructions Optional: One layer of unna paste to top of compression wrap (to act as an anchor). UrgoK2 LITE Elevate, Exercise Daily and A void Standing for Long Periods of Time. Elevate legs to the level of the heart and pump ankles as often as possible Elevate leg(s) parallel to the floor when sitting. Wound  Treatment Wound #1 - Lower Leg Wound Laterality: Left, Anterior Cleanser: Soap and Water 1 x Per Week/15 Days Discharge Instructions: Gently cleanse wound with antibacterial soap, rinse and pat dry prior to dressing wounds Cleanser: Vashe 5.8 (oz) 1 x Per Week/15 Days Discharge Instructions: Use vashe 5.8 (oz) as directed Prim Dressing: Xeroform 4x4-HBD (in/in) 1 x Per Week/15 Days ary Discharge Instructions: Apply Xeroform 4x4-HBD (in/in) as directed Secondary Dressing: ABD Pad 5x9 (in/in) 1 x Per Week/15 Days Discharge Instructions: Cover with ABD pad Compression Wrap: Urgo K2 Lite, two layer compression system, regular 1 x Per Week/15 Days Electronic Signature(s) Signed: 09/27/2023 11:53:39 AM  By: Angelina Pih Signed: 09/27/2023 3:46:23 PM By: Allen Derry PA-C Entered By: Angelina Pih on 09/27/2023 07:23:10 -------------------------------------------------------------------------------- Problem List Details Patient Name: Date of Service: [name], DO RO [facility] M. 09/27/2023 9:45 A M Medical Record Number: [id number] Patient Account Number: [id number] Date of Birth/Sex: Treating RN: Feb 23, 1941 (82 y.o. 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.3 Idiopathic progressive neuropathy 09/13/2023 No Yes I48.0 Paroxysmal atrial fibrillation 09/13/2023 No Yes I25.10 Atherosclerotic heart disease of native coronary artery without angina pectoris 09/13/2023 No Yes Z79.01 Long term  (current) use of anticoagulants 09/13/2023 No Yes Inactive Problems Resolved Problems Electronic Signature(s) Signed: 09/27/2023 9:47:52 AM By: Allen Derry PA-C Entered By: Allen Derry on 09/27/2023 06:47:51 -------------------------------------------------------------------------------- Progress Note Details Patient Name: Date of Service: [name], DO RO [facility] M. 09/27/2023 9:45 A M Medical Record Number: [id number] Patient Account Number: [id number] Date of Birth/Sex: Treating RN: 1940/12/23 (82 y.o. Esmeralda Links Primary Care Provider: Yves Dill Other Clinician: Referring Provider: Treating Provider/Extender: Luan Moore, Neelam Weeks in Treatment: 2 Subjective Chief Complaint Information obtained from Patient Left LE Ulcer History of Present Illness (HPI) Chronic/Inactive Condition: 09-13-2023 upon evaluation today patient's ABIs on the right 1.36 on the left 1.22 and this was performed on August 31, 2023 when she was in the hospital. She seems to have good arterial flow to be able to heal the wound. 09-13-2023 upon evaluation today patient appears to be doing poorly currently in regard to her wound which is on the left anterior lower extremity. This is a wound that was acquired on August 24, 2023. With that being said the patient had this skin tear that occurred as a result of trauma and then subsequently was in the hospital from November 20 until November 29. Xeroform has been used along with border foam dressings. She is on Coumadin. With that being said she did have ABIs done and they are noted above. Patient does have a history of chronic venous hypertension, neuropathy, atrial fibrillation, coronary artery disease, and she is on long-term anticoagulant therapy specifically Coumadin. 09-20-2023 upon evaluation patient appears to be doing better in regard to the wound. Overall I do believe that we are seeing signs of improvement and in fact the wound is measuring  smaller. I am very pleased in that regard and I think that she is making good headway here. 09-27-23 on evaluation patient's wound actually appears to be doing quite well today. Fortunately, I think that she is actually just about healed. There is a lot of dry skin around the edge of the wound. I'm gonna clear that away today. DAYONA, SHOTT U ([id number]) [id number] Page 6 of 8 Objective Constitutional Well-nourished and well-hydrated in no acute distress. Vitals Time Taken: 9:45 AM, Height: 71 in, Weight: 190 lbs, BMI: 26.5, Temperature: 98.4 F, Pulse: 68 bpm, Respiratory Rate: 18 breaths/min, Blood Pressure: 175/76 mmHg. Respiratory normal breathing without difficulty. Psychiatric this patient is able to make decisions and demonstrates good insight into disease process. Alert and Oriented x 3. pleasant and cooperative. General Notes: Patient's wound bed showed signs of good granulation and epithelization at this point.  I do not see any signs of infection and in fact, I think there's just a very tiny area remaining open at this point. She will likely be healed next week. Integumentary (Hair, Skin) Wound #1 status is Open. Original cause of wound was Trauma. The date acquired was: 08/24/2023. The wound has been in treatment 2 weeks. The wound is located on the Left,Anterior Lower Leg. The wound measures 2.9cm length x 4.5cm width x 0.1cm depth; 10.249cm^2 area and 1.025cm^3 volume. There is Fat Layer (Subcutaneous Tissue) exposed. There is no tunneling or undermining noted. There is a medium amount of serosanguineous drainage noted. The wound margin is distinct with the outline attached to the wound base. There is medium (34-66%) red, pink granulation within the wound bed. There is a small (1-33%) amount of necrotic tissue within the wound bed including Eschar. 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 There was a Selective/Open Wound Skin/Epidermis Debridement with a total area of 0.01 sq cm performed by Allen Derry, PA-C. With the following instrument(s): Curette to remove Viable and Non-Viable tissue/material. Material removed includes Skin: Dermis and Skin: Epidermis and after achieving pain control using Lidocaine 4% T opical Solution. No specimens were taken. A time out was conducted at 10:17, prior to the start of the procedure. A Moderate amount of bleeding was controlled with Pressure. The procedure was tolerated well. Post Debridement Measurements: 0.1cm length x 0.1cm width x 0.1cm depth; 0.001cm^3 volume. Character of Wound/Ulcer Post Debridement is stable. Severity of Tissue Post Debridement is: Fat layer exposed. Post procedure Diagnosis Wound #1: Same as Pre-Procedure Plan Follow-up Appointments: Return Appointment in 1 week. Nurse Visit as needed - PRN Bathing/ Shower/ Hygiene: May shower with wound dressing protected with water repellent cover or cast protector. No tub bath. Anesthetic (Use 'Patient Medications' Section for Anesthetic Order Entry): Lidocaine applied to wound bed Edema Control - Orders / Instructions: Optional: One layer of unna paste to top of compression wrap (to act as an anchor). UrgoK2 LITE Elevate, Exercise Daily and Avoid Standing for Long Periods of Time. Elevate legs to the level of the heart and pump ankles as often as possible Elevate leg(s) parallel to the floor when  sitting. WOUND #1: - Lower Leg Wound Laterality: Left, Anterior ANDRYA, KUSNIERZ ([id number]) [id number] Page 7 of 8 Cleanser: Soap and Water 1 x Per Week/15 Days Discharge Instructions: Gently cleanse wound with antibacterial soap, rinse and pat dry prior to dressing wounds Cleanser: Vashe 5.8 (oz) 1 x Per Week/15 Days Discharge Instructions: Use vashe 5.8 (oz) as directed Prim Dressing: Xeroform 4x4-HBD (in/in) 1 x Per Week/15 Days ary Discharge Instructions: Apply Xeroform 4x4-HBD (in/in) as directed Secondary Dressing: ABD Pad 5x9 (in/in) 1 x Per Week/15 Days Discharge Instructions: Cover with ABD pad Com pression Wrap: Urgo K2 Lite, two layer compression system, regular 1 x Per Week/15 Days 1. I'm gonna recommend that she can continue with the xeroform which I think is doing very well for her currently I think she'll probably be healed by next week. 2. I'm also gonna recommend that  we continue with the compression wrap which seems to be doing excellent for her. She is gonna bring her compression socks with her next week. We will see her before a reevaluation in one week. Electronic Signature(s) Signed: 09/27/2023 3:46:23 PM By: Allen Derry PA-C Entered By: Allen Derry on 09/27/2023 12:18:19 -------------------------------------------------------------------------------- SuperBill Details Patient Name: Date of Service: [name], DO RO [facility]Y M. 09/27/2023 Medical Record Number: [id number] Patient Account Number: [id number] Date of Birth/Sex: Treating RN: 09/17/1941 (82 y.o. Esmeralda Links Primary Care Provider: Yves Dill Other Clinician: Referring Provider: Treating Provider/Extender: Luan Moore, Neelam Weeks in Treatment: 2 Diagnosis Coding ICD-10 Codes Code Description [phone] Chronic venous hypertension (idiopathic) with ulcer and inflammation of left lower extremity L97.822 Non-pressure chronic ulcer of other part of left lower leg  with fat layer exposed G60.3 Idiopathic progressive neuropathy I48.0 Paroxysmal atrial fibrillation I25.10 Atherosclerotic heart disease of native coronary artery without angina pectoris Z79.01 Long term (current) use of anticoagulants Facility Procedures : CPT4 Code: [id number] Description: 97597 - DEBRIDE WOUND 1ST 20 SQ CM OR < ICD-10 Diagnosis Description L97.822 Non-pressure chronic ulcer of other part of left lower leg with fat layer exposed Modifier: Quantity: 1 Physician Procedures : CPT4 Code Description Modifier [id number] - WC PHYS DEBR WO ANESTH 20 SQ CM ICD-10 Diagnosis Description L97.822 Non-pressure chronic ulcer of other part of left lower leg with fat layer exposed Vivia Ewing ([id number])  [id number] Page Quantity: 1 8 of 8 Electronic Signature(s) Signed: 09/27/2023 3:46:23 PM By: Allen Derry PA-C Entered By: Allen Derry on 09/27/2023 12:18:27

## 2023-09-29 ENCOUNTER — Ambulatory Visit: Payer: Medicare PPO | Admitting: Internal Medicine

## 2023-09-29 ENCOUNTER — Other Ambulatory Visit: Payer: Self-pay

## 2023-09-29 MED ORDER — ALLOPURINOL 100 MG PO TABS: 100.0000 mg | ORAL_TABLET | Freq: Every day | ORAL | 1 refills | Status: DC

## 2023-09-30 ENCOUNTER — Other Ambulatory Visit: Payer: Self-pay | Admitting: Internal Medicine

## 2023-09-30 ENCOUNTER — Other Ambulatory Visit: Payer: Medicare PPO

## 2023-09-30 ENCOUNTER — Encounter: Payer: Self-pay | Admitting: Internal Medicine

## 2023-09-30 DIAGNOSIS — I82531 Chronic embolism and thrombosis of right popliteal vein: Secondary | ICD-10-CM

## 2023-09-30 DIAGNOSIS — E790 Hyperuricemia without signs of inflammatory arthritis and tophaceous disease: Secondary | ICD-10-CM

## 2023-09-30 MED ORDER — APIXABAN 5 MG PO TABS: 5.0000 mg | ORAL_TABLET | Freq: Two times a day (BID) | ORAL | 3 refills | Status: DC

## 2023-09-30 MED ORDER — SERTRALINE HCL 25 MG PO TABS: 25.0000 mg | ORAL_TABLET | Freq: Three times a day (TID) | ORAL | 1 refills | Status: DC

## 2023-09-30 MED ORDER — ALLOPURINOL 100 MG PO TABS: 100.0000 mg | ORAL_TABLET | Freq: Every day | ORAL | 1 refills | Status: DC

## 2023-10-01 LAB — PROTIME-INR
INR: 3 — ABNORMAL HIGH (ref 0.9–1.2)
Prothrombin Time: 30.8 s — ABNORMAL HIGH (ref 9.1–12.0)

## 2023-10-03 ENCOUNTER — Encounter: Payer: Medicare PPO | Admitting: Internal Medicine

## 2023-10-03 ENCOUNTER — Telehealth: Payer: Self-pay | Admitting: Internal Medicine

## 2023-10-03 DIAGNOSIS — I480 Paroxysmal atrial fibrillation: Secondary | ICD-10-CM | POA: Diagnosis not present

## 2023-10-03 DIAGNOSIS — L97822 Non-pressure chronic ulcer of other part of left lower leg with fat layer exposed: Secondary | ICD-10-CM | POA: Diagnosis not present

## 2023-10-03 DIAGNOSIS — G603 Idiopathic progressive neuropathy: Secondary | ICD-10-CM | POA: Diagnosis not present

## 2023-10-03 DIAGNOSIS — Z7901 Long term (current) use of anticoagulants: Secondary | ICD-10-CM | POA: Diagnosis not present

## 2023-10-03 DIAGNOSIS — I2510 Atherosclerotic heart disease of native coronary artery without angina pectoris: Secondary | ICD-10-CM | POA: Diagnosis not present

## 2023-10-03 DIAGNOSIS — S81802A Unspecified open wound, left lower leg, initial encounter: Secondary | ICD-10-CM | POA: Diagnosis not present

## 2023-10-03 DIAGNOSIS — I87332 Chronic venous hypertension (idiopathic) with ulcer and inflammation of left lower extremity: Secondary | ICD-10-CM | POA: Diagnosis not present

## 2023-10-03 NOTE — Progress Notes (Signed)
ABRIA, BRONAUGH ([id number]) [id number] Page 1 of 5 Visit Report for 10/03/2023 HPI Details Patient Name: Date of Service: Sarah Eric, DO [place]. 10/03/2023 2:30 PM Medical Record Number: [id number] Patient Account Number: [id number] Date of Birth/Sex: Treating RN: 1941/02/14 (82 y.o. Freddy Finner Primary Care Provider: Yves Dill Other Clinician: Referring Provider: Treating Provider/Extender: Sarah BSO N, MICHA EL Cena Benton, Neelam Weeks in Treatment: 2 History of Present Illness Chronic/Inactive Conditions Condition 1: 09-13-2023 upon evaluation today patient's ABIs on the right 1.36 on the left 1.22 and this was performed on August 31, 2023 when she was in the hospital. She seems to have good arterial flow to be able to heal the wound. HPI Description: 09-13-2023 upon evaluation today patient appears to be doing poorly currently in regard to her wound which is on the left anterior lower extremity. This is a wound that was acquired on August 24, 2023. With that being said the patient had this skin tear that occurred as a result of trauma and then subsequently was in the hospital from November 20 until November 29. Xeroform has been used along with border foam dressings. She is on Coumadin. With that being said she did have ABIs done and they are noted above. Patient does have a history of chronic venous hypertension, neuropathy, atrial fibrillation, coronary artery disease, and she is on long-term anticoagulant therapy specifically Coumadin. 09-20-2023 upon evaluation patient appears to be doing better in regard to the wound. Overall I do believe that we are seeing signs of improvement and in fact the wound is measuring smaller. I am very pleased in that regard and I think that she is making good headway here. 09-27-23 on evaluation patient's wound actually appears to be doing quite well today. Fortunately, I think that she is actually just about healed.  There is a lot of dry skin around the edge of the wound. I'm gonna clear that away today. 20/30; left anterior lower leg is completely healed. She does not have compression stockings. Apparently support stockings were suggested for her last week Electronic Signature(s) Signed: 10/03/2023 4:31:08 PM By: Baltazar Najjar MD Entered By: Baltazar Najjar on 10/03/2023 11:38:24 -------------------------------------------------------------------------------- Physical Exam Details Patient Name: Date of Service: Sarah Caprice Renshaw, DO Sarah THY M. 10/03/2023 2:30 PM Medical Record Number: [id number] Patient Account Number: [id number] Date of Birth/Sex: Treating RN: 06-13-1941 (82 y.o. Freddy Finner Primary Care Provider: Yves Dill Other Clinician: Referring Provider: Treating Provider/Extender: Sarah BSO N, MICHA EL Cena Benton, Neelam Weeks in Treatment: 2 Constitutional Patient is hypertensive.. Pulse regular and within target range for patient.Marland Kitchen Respirations regular, non-labored and within target range.. Temperature is normal and within the target range for the patient.Marland Kitchen appears in no distress. SERENITI, HOUCHENS ([id number]) [id number] Page 2 of 5 Notes Wound exam; the wound is completely healed on the left anterior mid tibia. Obviously has chronic venous insufficiency with still some nonpitting edema. Nothing is open on either leg though Electronic Signature(s) Signed: 10/03/2023 4:31:08 PM By: Baltazar Najjar MD Entered By: Baltazar Najjar on 10/03/2023 11:41:15 -------------------------------------------------------------------------------- Physician Orders Details Patient Name: Date of Service: Sarah Eric, DO Sarah THY M. 10/03/2023 2:30 PM Medical Record Number: [id number] Patient Account Number: [id number] Date of Birth/Sex: Treating RN: 27-Dec-1940 (82 y.o. Freddy Finner Primary Care Provider: Yves Dill Other Clinician: Referring Provider: Treating Provider/Extender: Sarah  BSO N, MICHA EL Cena Benton, Neelam Weeks in Treatment: 2 The following information was scribed by: Yevonne Pax The information was scribed for: Allen Derry Verbal /  Phone Orders: No Diagnosis Coding Discharge From [facility] Services Wear compression garments daily. Put garments on first thing when you wake up and remove them before bed. - 15 to 20 mmhg Moisturize legs daily after removing compression garments. Elevate, Exercise Daily and A void Standing for Long Periods of Time. Wound Treatment Electronic Signature(s) Signed: 10/03/2023 3:37:08 PM By: Yevonne Pax RN Signed: 10/03/2023 4:31:08 PM By: Baltazar Najjar MD Entered By: Yevonne Pax on 10/03/2023 11:33:48 -------------------------------------------------------------------------------- Problem List Details Patient Name: Date of Service: Sarah Eric, DO Sarah THY M. 10/03/2023 2:30 PM Medical Record Number: [id number] Patient Account Number: [id number] Date of Birth/Sex: Treating RN: Jun 24, 1941 (82 y.o. Freddy Finner Primary Care Provider: Yves Dill Other Clinician: Referring Provider: Treating Provider/Extender: Sarah BSO Dorris Carnes, MICHA EL Cena Benton, Neelam Weeks in Treatment: 2 Active Problems ICD-10 HAYDEN, VOZZELLA ([id number]) [id number] Page 3 of 5 Encounter Code Description Active Date MDM Diagnosis I87.332 Chronic venous hypertension (idiopathic) with ulcer and inflammation of left 09/13/2023 No Yes lower extremity L97.822 Non-pressure chronic ulcer of other part of left lower leg with fat layer exposed12/07/2023 No Yes G60.3 Idiopathic progressive neuropathy 09/13/2023 No Yes I48.0 Paroxysmal atrial fibrillation 09/13/2023 No Yes I25.10 Atherosclerotic heart disease of native coronary artery without angina pectoris 09/13/2023 No Yes Z79.01 Long term (current) use of anticoagulants 09/13/2023 No Yes Inactive Problems Resolved Problems Electronic Signature(s) Signed: 10/03/2023 4:31:08 PM By: Baltazar Najjar MD Entered By: Baltazar Najjar on 10/03/2023 11:37:42 -------------------------------------------------------------------------------- Progress Note Details Patient Name: Date of Service: Sarah Eric, DO Sarah THY M. 10/03/2023 2:30 PM Medical Record Number: [id number] Patient Account Number: [id number] Date of Birth/Sex: Treating RN: November 20, 1940 (82 y.o. Freddy Finner Primary Care Provider: Yves Dill Other Clinician: Referring Provider: Treating Provider/Extender: Sarah BSO N, MICHA EL Cena Benton, Neelam Weeks in Treatment: 2 Subjective History of Present Illness (HPI) Chronic/Inactive Condition: 09-13-2023 upon evaluation today patient's ABIs on the right 1.36 on the left 1.22 and this was performed on August 31, 2023 when she was in the hospital. She seems to have good arterial flow to be able to heal the wound. 09-13-2023 upon evaluation today patient appears to be doing poorly currently in regard to her wound which is on the left anterior lower extremity. This is a wound that was acquired on August 24, 2023. With that being said the patient had this skin tear that occurred as a result of trauma and then subsequently was in the hospital from November 20 until November 29. Xeroform has been used along with border foam dressings. She is on Coumadin. With that being said she did have ABIs done and they are noted above. Patient does have a history of chronic venous hypertension, neuropathy, atrial fibrillation, coronary artery disease, and she is on long-term anticoagulant therapy specifically Coumadin. 09-20-2023 upon evaluation patient appears to be doing better in regard to the wound. Overall I do believe that we are seeing signs of improvement and in fact the wound is measuring smaller. I am very pleased in that regard and I think that she is making good headway here. 09-27-23 on evaluation patient's wound actually appears to be doing quite well today. Fortunately, I think that she is  actually just about healed. There is a lot of XIYA, MAEZ ([id number]) [id number] Page 4 of 5 dry skin around the edge of the wound. I'm gonna clear that away today. 20/30; left anterior lower leg is completely healed. She does not have compression stockings. Apparently support stockings were suggested for her last  week Objective Constitutional Patient is hypertensive.. Pulse regular and within target range for patient.Marland Kitchen Respirations regular, non-labored and within target range.. Temperature is normal and within the target range for the patient.Marland Kitchen appears in no distress. Vitals Time Taken: 2:23 PM, Height: 71 in, Weight: 190 lbs, BMI: 26.5, Temperature: 98.7 F, Pulse: 74 bpm, Respiratory Rate: 18 breaths/min, Blood Pressure: 173/73 mmHg. General Notes: Wound exam; the wound is completely healed on the left anterior mid tibia. Obviously has chronic venous insufficiency with still some nonpitting edema. Nothing is open on either leg though Integumentary (Hair, Skin) Wound #1 status is Open. Original cause of wound was Trauma. The date acquired was: 08/24/2023. The wound has been in treatment 2 weeks. The wound is located on the Left,Anterior Lower Leg. The wound measures 0cm length x 0cm width x 0cm depth; 0cm^2 area and 0cm^3 volume. There is no tunneling or undermining noted. There is a none present amount of drainage noted. The wound margin is distinct with the outline attached to the wound base. There is no granulation within the wound bed. There is no necrotic tissue within the wound bed. 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 Put garments on first thing when you wake up and remove them before bed. - 15 to 20 mmhg Moisturize legs daily after removing compression garments. Elevate, Exercise Daily and Avoid Standing for Long Periods of Time. 1. The patient is healed. 2. She has stockings at home we gave her suggestions for elastic therapy. 3. Suggested skin moisturizer. 4. She can be discharged from the wound care center Electronic Signature(s) Signed: 10/03/2023 4:31:08 PM By: Baltazar Najjar MD Entered By: Baltazar Najjar on 10/03/2023 11:42:25 Sarah Riddle ([id number]) [id number] Page 5 of 5 -------------------------------------------------------------------------------- SuperBill Details Patient Name: Date of Service: Sarah Held. 10/03/2023 Medical Record Number: [id number] Patient Account Number: [id number] Date of Birth/Sex: Treating RN: 12-Mar-1941 (82 y.o. Freddy Finner Primary Care Provider: Yves Dill Other Clinician: Referring Provider: Treating Provider/Extender: Sarah BSO N, MICHA EL Cena Benton, Neelam Weeks in Treatment: 2 Diagnosis Coding ICD-10 Codes Code Description [phone] Chronic venous hypertension (idiopathic) with ulcer and inflammation of left lower extremity L97.822 Non-pressure chronic ulcer of other part of left lower leg with fat layer exposed G60.3 Idiopathic progressive neuropathy I48.0 Paroxysmal atrial fibrillation I25.10 Atherosclerotic heart disease of native coronary artery without angina pectoris Z79.01 Long term (current) use of anticoagulants Facility Procedures : CPT4 Code: [id number] Description: 84696 - WOUND CARE VISIT-LEV 2 EST PT Modifier: Quantity: 1 Physician Procedures : CPT4 Code Description Modifier [id number] - WC PHYS LEVEL 3 - EST PT ICD-10 Diagnosis Description I87.332 Chronic venous hypertension (idiopathic) with ulcer  and inflammation of left lower extremity L97.822 Non-pressure chronic ulcer of other part  of left lower leg with fat layer exposed Quantity: 1 Electronic Signature(s) Signed: 10/03/2023 2:47:43 PM By: Yevonne Pax RN Signed: 10/03/2023 4:31:08 PM By: Baltazar Najjar MD Entered By: Yevonne Pax on 10/03/2023 11:47:43

## 2023-10-03 NOTE — Progress Notes (Signed)
Sarah Riddle, Sarah Riddle ([id number]) [id number] Page 1 of 8 Visit Report for 10/03/2023 Arrival Information Details Patient Name: Date of Service: Sarah Eric, DO [place]. 10/03/2023 2:30 PM Medical Record Number: [id number] Patient Account Number: [id number] Date of Birth/Sex: Treating RN: 1940/11/27 (82 y.o. Freddy Finner Primary Care Camryn Quesinberry: Yves Dill Other Clinician: Referring Teosha Casso: Treating Cara Thaxton/Extender: Sarah BSO N, MICHA EL Cena Benton, Neelam Weeks in Treatment: 2 Visit Information History Since Last Visit Added or deleted any medications: No Patient Arrived: Ambulatory Any new allergies or adverse reactions: No Arrival Time: 14:23 Had a fall or experienced change in No Accompanied By: daughter activities of daily living that may affect Transfer Assistance: None risk of falls: Patient Identification Verified: Yes Signs or symptoms of abuse/neglect since last visito No Secondary Verification Process Completed: Yes Hospitalized since last visit: No Patient Requires Transmission-Based No Implantable device outside of the clinic excluding No Precautions: cellular tissue based products placed in the center Patient Has Alerts: Yes since last visit: Patient Alerts: Patient on Blood Thinner Has Dressing in Place as Prescribed: Yes Warfarin Has Compression in Place as Prescribed: Yes ABI R 1.36 L1.22 08/31/23 Pain Present Now: No Electronic Signature(s) Signed: 10/03/2023 3:37:08 PM By: Yevonne Pax RN Entered By: Yevonne Pax on 10/03/2023 11:23:26 -------------------------------------------------------------------------------- Clinic Level of Care Assessment Details Patient Name: Date of Service: Sarah Eric, DO [place] M. 10/03/2023 2:30 PM Medical Record Number: [id number] Patient Account Number: [id number] Date of Birth/Sex: Treating RN: [birth year] (82 y.o. Freddy Finner Primary Care Marja Adderley: Yves Dill Other Clinician: Referring  Fallen Crisostomo: Treating Finesse Fielder/Extender: Sarah BSO N, MICHA EL Cena Benton, Neelam Weeks in Treatment: 2 Clinic Level of Care Assessment Items TOOL 4 Quantity Score X- 1 0 Use when only an EandM is performed on FOLLOW-UP visit ASSESSMENTS - Nursing Assessment / Reassessment X- 1 10 Reassessment of Co-morbidities (includes updates in patient status) X- 1 5 Reassessment of Adherence to Treatment Plan Sarah Riddle, Sarah Riddle ([id number]) [id number] Page 2 of 8 ASSESSMENTS - Wound and Skin A ssessment / Reassessment X - Simple Wound Assessment / Reassessment - one wound 1 5 []  - 0 Complex Wound Assessment / Reassessment - multiple wounds []  - 0 Dermatologic / Skin Assessment (not related to wound area) ASSESSMENTS - Focused Assessment []  - 0 Circumferential Edema Measurements - multi extremities []  - 0 Nutritional Assessment / Counseling / Intervention []  - 0 Lower Extremity Assessment (monofilament, tuning fork, pulses) []  - 0 Peripheral Arterial Disease Assessment (using hand held doppler) ASSESSMENTS - Ostomy and/or Continence Assessment and Care []  - 0 Incontinence Assessment and Management []  - 0 Ostomy Care Assessment and Management (repouching, etc.) PROCESS - Coordination of Care X - Simple Patient / Family Education for ongoing care 1 15 []  - 0 Complex (extensive) Patient / Family Education for ongoing care []  - 0 Staff obtains Chiropractor, Records, T Results / Process Orders est []  - 0 Staff telephones HHA, Nursing Homes / Clarify orders / etc []  - 0 Routine Transfer to another Facility (non-emergent condition) []  - 0 Routine Hospital Admission (non-emergent condition) []  - 0 New Admissions / Manufacturing engineer / Ordering NPWT Apligraf, etc. , []  - 0 Emergency Hospital Admission (emergent condition) X- 1 10 Simple Discharge Coordination []  - 0 Complex (extensive) Discharge Coordination PROCESS - Special Needs []  - 0 Pediatric / Minor Patient  Management []  - 0 Isolation Patient Management []  - 0 Hearing / Language / Visual special needs []  - 0 Assessment of Community assistance (transportation, D/C planning,  etc.) []  - 0 Additional assistance / Altered mentation []  - 0 Support Surface(s) Assessment (bed, cushion, seat, etc.) INTERVENTIONS - Wound Cleansing / Measurement X - Simple Wound Cleansing - one wound 1 5 []  - 0 Complex Wound Cleansing - multiple wounds X- 1 5 Wound Imaging (photographs - any number of wounds) []  - 0 Wound Tracing (instead of photographs) X- 1 5 Simple Wound Measurement - one wound []  - 0 Complex Wound Measurement - multiple wounds INTERVENTIONS - Wound Dressings []  - 0 Small Wound Dressing one or multiple wounds []  - 0 Medium Wound Dressing one or multiple wounds []  - 0 Large Wound Dressing one or multiple wounds []  - 0 Application of Medications - topical []  - 0 Application of Medications - injection INTERVENTIONS - Miscellaneous []  - 0 External ear exam Sarah Riddle, Sarah Riddle ([id number]) [id number] Page 3 of 8 []  - 0 Specimen Collection (cultures, biopsies, blood, body fluids, etc.) []  - 0 Specimen(s) / Culture(s) sent or taken to Lab for analysis []  - 0 Patient Transfer (multiple staff / Michiel Sites Lift / Similar devices) []  - 0 Simple Staple / Suture removal (25 or less) []  - 0 Complex Staple / Suture removal (26 or more) []  - 0 Hypo / Hyperglycemic Management (close monitor of Blood Glucose) []  - 0 Ankle / Brachial Index (ABI) - do not check if billed separately X- 1 5 Vital Signs Has the patient been seen at the hospital within the last three years: Yes Total Score: 65 Level Of Care: New/Established - Level 2 Electronic Signature(s) Signed: 10/03/2023 3:37:08 PM By: Yevonne Pax RN Entered By: Yevonne Pax on 10/03/2023 11:47:33 -------------------------------------------------------------------------------- Encounter Discharge Information  Details Patient Name: Date of Service: Sarah Eric, DO Sarah THY M. 10/03/2023 2:30 PM Medical Record Number: [id number] Patient Account Number: [id number] Date of Birth/Sex: Treating RN: Mar 25, 1941 (82 y.o. Freddy Finner Primary Care Adelena Desantiago: Yves Dill Other Clinician: Referring Dilan Novosad: Treating Alexxus Sobh/Extender: Sarah BSO N, MICHA EL Cena Benton, Neelam Weeks in Treatment: 2 Encounter Discharge Information Items Discharge Condition: Stable Ambulatory Status: Ambulatory Discharge Destination: Home Transportation: Private Auto Accompanied By: self Schedule Follow-up Appointment: No Clinical Summary of Care: Electronic Signature(s) Signed: 10/03/2023 2:59:05 PM By: Yevonne Pax RN Entered By: Yevonne Pax on 10/03/2023 11:59:05 Lower Extremity Assessment Details -------------------------------------------------------------------------------- Sarah Riddle ([id number]) [id number] Page 4 of 8 Patient Name: Date of Service: Sarah Eric, DO [place]. 10/03/2023 2:30 PM Medical Record Number: [id number] Patient Account Number: [id number] Date of Birth/Sex: Treating RN: 02-Jun-1941 (82 y.o. Freddy Finner Primary Care Renaldo Gornick: Yves Dill Other Clinician: Referring Linton Stolp: Treating Alexyss Balzarini/Extender: Sarah BSO N, MICHA EL Cena Benton, Neelam Weeks in Treatment: 2 Electronic Signature(s) Signed: 10/03/2023 3:37:08 PM By: Yevonne Pax RN Entered By: Yevonne Pax on 10/03/2023 11:31:55 -------------------------------------------------------------------------------- Multi Wound Chart Details Patient Name: Date of Service: Sarah Eric, DO Sarah THY M. 10/03/2023 2:30 PM Medical Record Number: [id number] Patient Account Number: [id number] Date of Birth/Sex: Treating RN: [birth year] (82 y.o. Freddy Finner Primary Care Akim Watkinson: Yves Dill Other Clinician: Referring Lucus Lambertson: Treating Mystic Labo/Extender: Sarah BSO N, MICHA EL Cena Benton, Neelam Weeks in Treatment: 2 Vital  Signs Height(in): 71 Pulse(bpm): 74 Weight(lbs): 190 Blood Pressure(mmHg): 173/73 Body Mass Index(BMI): 26.5 Temperature(F): 98.7 Respiratory Rate(breaths/min): 18 [1:Photos:] [N/A:N/A] Left, Anterior Lower Leg N/A N/A Wound Location: Trauma N/A N/A Wounding Event: Venous Leg Ulcer N/A N/A Primary Etiology: Sleep Apnea, Coronary Artery N/A N/A Comorbid History: Disease, Hypertension, Neuropathy 08/24/2023 N/A N/A Date Acquired: 2 N/A N/A Weeks of Treatment:  Open N/A N/A Wound Status: No N/A N/A Wound Recurrence: 0x0x0 N/A N/A Measurements L x W x D (cm) 0 N/A N/A A (cm) : rea 0 N/A N/A Volume (cm) : 100.00% N/A N/A % Reduction in Area: 100.00% N/A N/A % Reduction in Volume: Full Thickness Without Exposed N/A N/A Classification: Support Structures None Present N/A N/A Exudate Amount: Distinct, outline attached N/A N/A Wound Margin: None Present (0%) N/A N/A Granulation Amount: None Present (0%) N/A N/A Necrotic Amount: Fascia: No N/A N/A Exposed Structures: Fat Layer (Subcutaneous Tissue): No Tendon: No Muscle: No Joint: No Sarah Riddle, Sarah Riddle ([id number]) [id number] Page 5 of 8 Bone: No Medium (34-66%) N/A N/A Epithelialization: Treatment Notes Electronic Signature(s) Signed: 10/03/2023 3:37:08 PM By: Yevonne Pax RN Entered By: Yevonne Pax on 10/03/2023 11:32:02 -------------------------------------------------------------------------------- Multi-Disciplinary Care Plan Details Patient Name: Date of Service: Sarah Eric, DO Sarah THY M. 10/03/2023 2:30 PM Medical Record Number: [id number] Patient Account Number: [id number] Date of Birth/Sex: Treating RN: 11-Sep-1941 (82 y.o. Freddy Finner Primary Care Jesselle Laflamme: Yves Dill Other Clinician: Referring Donavin Audino: Treating Neco Kling/Extender: Sarah BSO N, MICHA EL Cena Benton, Neelam Weeks in Treatment: 2 Active Inactive Electronic Signature(s) Signed: 10/03/2023 2:51:38 PM By: Yevonne Pax RN Entered By: Yevonne Pax on 10/03/2023 11:51:38 -------------------------------------------------------------------------------- Pain Assessment Details Patient Name: Date of Service: Sarah Eric, DO Sarah THY M. 10/03/2023 2:30 PM Medical Record Number: [id number] Patient Account Number: [id number] Date of Birth/Sex: Treating RN: 30-Jun-1941 (82 y.o. Freddy Finner Primary Care Brooklynn Brandenburg: Yves Dill Other Clinician: Referring Siani Utke: Treating Ajeenah Heiny/Extender: Sarah BSO N, MICHA EL Cena Benton, Neelam Weeks in Treatment: 2 Active Problems Location of Pain Severity and Description of Pain Patient Has Paino No Site Locations [place] ([id number]) [id number] Page 6 of 8 Pain Management and Medication Current Pain Management: Electronic Signature(s) Signed: 10/03/2023 3:37:08 PM By: Yevonne Pax RN Entered By: Yevonne Pax on 10/03/2023 11:24:02 -------------------------------------------------------------------------------- Patient/Caregiver Education Details Patient Name: Date of Service: Sarah Eric, DO Sarah Riddle 12/30/2024andnbsp2:30 PM Medical Record Number: [id number] Patient Account Number: [id number] Date of Birth/Gender: Treating RN: 07/13/1941 (82 y.o. Freddy Finner Primary Care Physician: Yves Dill Other Clinician: Referring Physician: Treating Physician/Extender: Sarah BSO N, MICHA EL Cena Benton, Neelam Weeks in Treatment: 2 Education Assessment Education Provided To: Patient Education Topics Provided Wound/Skin Impairment: Handouts: Caring for Your Ulcer Methods: Explain/Verbal Responses: State content correctly Electronic Signature(s) Signed: 10/03/2023 3:37:08 PM By: Yevonne Pax RN Entered By: Yevonne Pax on 10/03/2023 11:51:58 Sarah Riddle, Sarah Riddle ([id number]) [id number] Page 7 of 8 -------------------------------------------------------------------------------- Wound Assessment Details Patient Name:  Date of Service: Sarah Eric, DO [place]. 10/03/2023 2:30 PM Medical Record Number: [id number] Patient Account Number: [id number] Date of Birth/Sex: Treating RN: [birth year] (82 y.o. Freddy Finner Primary Care Peyten Weare: Yves Dill Other Clinician: Referring Sarah Riddle: Treating Sarah Riddle/Extender: Sarah BSO N, MICHA EL Cena Benton, Neelam Weeks in Treatment: 2 Wound Status Wound Number: 1 Primary Venous Leg Ulcer Etiology: Wound Location: Left, Anterior Lower Leg Wound Status: Open Wounding Event: Trauma Comorbid Sleep Apnea, Coronary Artery Disease, Hypertension, Date Acquired: 08/24/2023 History: Neuropathy Weeks Of Treatment: 2 Clustered Wound: No Photos Wound Measurements Length: (cm) Width: (cm) Depth: (cm) Area: (cm) Volume: (cm) 0 % Reduction in Area: 100% 0 % Reduction in Volume: 100% 0 Epithelialization: Medium (34-66%) 0 Tunneling: No 0 Undermining: No Wound Description Classification: Full Thickness Without Exposed Support Wound Margin: Distinct, outline attached Exudate Amount: None Present Structures Foul Odor After Cleansing: No Slough/Fibrino No Wound Bed Granulation Amount: None Present (  0%) Exposed Structure Necrotic Amount: None Present (0%) Fascia Exposed: No Fat Layer (Subcutaneous Tissue) Exposed: No Tendon Exposed: No Muscle Exposed: No Joint Exposed: No Bone Exposed: No Electronic Signature(s) Signed: 10/03/2023 3:37:08 PM By: Yevonne Pax RN Entered By: Yevonne Pax on 10/03/2023 11:31:43 Sarah Riddle, Sarah Riddle ([id number]) [id number] Page 8 of 8 -------------------------------------------------------------------------------- Vitals Details Patient Name: Date of Service: Sarah Eric, DO [place]. 10/03/2023 2:30 PM Medical Record Number: [id number] Patient Account Number: [id number] Date of Birth/Sex: Treating RN: 12-02-1940 (82 y.o. Freddy Finner Primary Care Natarsha Hurwitz: Yves Dill Other Clinician: Referring  Sarah Riddle: Treating Sarah Riddle/Extender: Sarah BSO N, MICHA EL Cena Benton, Neelam Weeks in Treatment: 2 Vital Signs Time Taken: 14:23 Temperature (F): 98.7 Height (in): 71 Pulse (bpm): 74 Weight (lbs): 190 Respiratory Rate (breaths/min): 18 Body Mass Index (BMI): 26.5 Blood Pressure (mmHg): 173/73 Reference Range: 80 - 120 mg / dl Electronic Signature(s) Signed: 10/03/2023 3:37:08 PM By: Yevonne Pax RN Entered By: Yevonne Pax on 10/03/2023 11:23:49

## 2023-10-03 NOTE — Telephone Encounter (Signed)
Patient left VM that [facility] will be faxing [place] to see which blood thinner she should be on - warfarin or Eliquis. Dr. Welton Flakes recently switched her to Eliquis so that is what we need to let [facility] know.

## 2023-10-05 ENCOUNTER — Encounter: Payer: Self-pay | Admitting: Internal Medicine

## 2023-10-06 ENCOUNTER — Other Ambulatory Visit: Payer: Self-pay

## 2023-10-10 ENCOUNTER — Other Ambulatory Visit: Payer: Self-pay

## 2023-10-10 MED ORDER — VITAMIN D (ERGOCALCIFEROL) 50000 UNITS PO CAPS: 1.0000 | ORAL_CAPSULE | ORAL | 3 refills | Status: DC

## 2023-10-13 ENCOUNTER — Other Ambulatory Visit: Payer: Self-pay

## 2023-10-13 DIAGNOSIS — I82531 Chronic embolism and thrombosis of right popliteal vein: Secondary | ICD-10-CM

## 2023-10-13 MED ORDER — APIXABAN 5 MG PO TABS: 5.0000 mg | ORAL_TABLET | Freq: Two times a day (BID) | ORAL | 0 refills | Status: DC

## 2023-11-08 ENCOUNTER — Inpatient Hospital Stay: Payer: Medicare PPO | Attending: Oncology

## 2023-11-08 DIAGNOSIS — M810 Age-related osteoporosis without current pathological fracture: Secondary | ICD-10-CM | POA: Diagnosis not present

## 2023-11-08 DIAGNOSIS — M2578 Osteophyte, vertebrae: Secondary | ICD-10-CM | POA: Diagnosis not present

## 2023-11-08 DIAGNOSIS — Z800 Family history of malignant neoplasm of digestive organs: Secondary | ICD-10-CM | POA: Insufficient documentation

## 2023-11-08 DIAGNOSIS — K760 Fatty (change of) liver, not elsewhere classified: Secondary | ICD-10-CM | POA: Insufficient documentation

## 2023-11-08 DIAGNOSIS — I4891 Unspecified atrial fibrillation: Secondary | ICD-10-CM | POA: Diagnosis not present

## 2023-11-08 DIAGNOSIS — Z8673 Personal history of transient ischemic attack (TIA), and cerebral infarction without residual deficits: Secondary | ICD-10-CM | POA: Diagnosis not present

## 2023-11-08 DIAGNOSIS — M5031 Other cervical disc degeneration,  high cervical region: Secondary | ICD-10-CM | POA: Insufficient documentation

## 2023-11-08 DIAGNOSIS — M50323 Other cervical disc degeneration at C6-C7 level: Secondary | ICD-10-CM | POA: Insufficient documentation

## 2023-11-08 DIAGNOSIS — I341 Nonrheumatic mitral (valve) prolapse: Secondary | ICD-10-CM | POA: Insufficient documentation

## 2023-11-08 DIAGNOSIS — R011 Cardiac murmur, unspecified: Secondary | ICD-10-CM | POA: Insufficient documentation

## 2023-11-08 DIAGNOSIS — Z86718 Personal history of other venous thrombosis and embolism: Secondary | ICD-10-CM | POA: Diagnosis not present

## 2023-11-08 DIAGNOSIS — Z806 Family history of leukemia: Secondary | ICD-10-CM | POA: Insufficient documentation

## 2023-11-08 DIAGNOSIS — Z79899 Other long term (current) drug therapy: Secondary | ICD-10-CM | POA: Insufficient documentation

## 2023-11-08 DIAGNOSIS — E785 Hyperlipidemia, unspecified: Secondary | ICD-10-CM | POA: Diagnosis not present

## 2023-11-08 DIAGNOSIS — M533 Sacrococcygeal disorders, not elsewhere classified: Secondary | ICD-10-CM | POA: Insufficient documentation

## 2023-11-08 DIAGNOSIS — I7090 Unspecified atherosclerosis: Secondary | ICD-10-CM | POA: Insufficient documentation

## 2023-11-08 DIAGNOSIS — I129 Hypertensive chronic kidney disease with stage 1 through stage 4 chronic kidney disease, or unspecified chronic kidney disease: Secondary | ICD-10-CM | POA: Insufficient documentation

## 2023-11-08 DIAGNOSIS — D649 Anemia, unspecified: Secondary | ICD-10-CM | POA: Insufficient documentation

## 2023-11-08 DIAGNOSIS — Z87442 Personal history of urinary calculi: Secondary | ICD-10-CM | POA: Insufficient documentation

## 2023-11-08 DIAGNOSIS — K580 Irritable bowel syndrome with diarrhea: Secondary | ICD-10-CM | POA: Diagnosis not present

## 2023-11-08 DIAGNOSIS — Z96642 Presence of left artificial hip joint: Secondary | ICD-10-CM | POA: Diagnosis not present

## 2023-11-08 DIAGNOSIS — Z803 Family history of malignant neoplasm of breast: Secondary | ICD-10-CM | POA: Insufficient documentation

## 2023-11-08 DIAGNOSIS — K219 Gastro-esophageal reflux disease without esophagitis: Secondary | ICD-10-CM | POA: Insufficient documentation

## 2023-11-08 DIAGNOSIS — Z7901 Long term (current) use of anticoagulants: Secondary | ICD-10-CM | POA: Diagnosis not present

## 2023-11-08 DIAGNOSIS — Z85820 Personal history of malignant melanoma of skin: Secondary | ICD-10-CM | POA: Insufficient documentation

## 2023-11-08 DIAGNOSIS — G4733 Obstructive sleep apnea (adult) (pediatric): Secondary | ICD-10-CM | POA: Diagnosis not present

## 2023-11-08 DIAGNOSIS — Z8042 Family history of malignant neoplasm of prostate: Secondary | ICD-10-CM | POA: Insufficient documentation

## 2023-11-08 LAB — LACTATE DEHYDROGENASE: LDH: 134 U/L (ref 98–192)

## 2023-11-08 LAB — IRON AND TIBC
Iron: 60 ug/dL (ref 28–170)
Saturation Ratios: 18 % (ref 10.4–31.8)
TIBC: 328 ug/dL (ref 250–450)
UIBC: 268 ug/dL

## 2023-11-08 LAB — CBC WITH DIFFERENTIAL/PLATELET
Abs Immature Granulocytes: 0.01 10*3/uL (ref 0.00–0.07)
Basophils Absolute: 0 10*3/uL (ref 0.0–0.1)
Basophils Relative: 1 %
Eosinophils Absolute: 0.2 10*3/uL (ref 0.0–0.5)
Eosinophils Relative: 4 %
HCT: 35.8 % — ABNORMAL LOW (ref 36.0–46.0)
Hemoglobin: 11.2 g/dL — ABNORMAL LOW (ref 12.0–15.0)
Immature Granulocytes: 0 %
Lymphocytes Relative: 34 %
Lymphs Abs: 1.5 10*3/uL (ref 0.7–4.0)
MCH: 28.9 pg (ref 26.0–34.0)
MCHC: 31.3 g/dL (ref 30.0–36.0)
MCV: 92.3 fL (ref 80.0–100.0)
Monocytes Absolute: 0.3 10*3/uL (ref 0.1–1.0)
Monocytes Relative: 6 %
Neutro Abs: 2.4 10*3/uL (ref 1.7–7.7)
Neutrophils Relative %: 55 %
Platelets: 186 10*3/uL (ref 150–400)
RBC: 3.88 MIL/uL (ref 3.87–5.11)
RDW: 12.8 % (ref 11.5–15.5)
WBC: 4.4 10*3/uL (ref 4.0–10.5)
nRBC: 0 % (ref 0.0–0.2)

## 2023-11-08 LAB — FERRITIN: Ferritin: 18 ng/mL (ref 11–307)

## 2023-11-08 LAB — RETIC PANEL
Immature Retic Fract: 4.9 % (ref 2.3–15.9)
RBC.: 3.8 MIL/uL — ABNORMAL LOW (ref 3.87–5.11)
Retic Count, Absolute: 31.5 10*3/uL (ref 19.0–186.0)
Retic Ct Pct: 0.8 % (ref 0.4–3.1)
Reticulocyte Hemoglobin: 30.4 pg (ref >27.9)

## 2023-11-08 LAB — VITAMIN B12: Vitamin B-12: 318 pg/mL (ref 180–914)

## 2023-11-09 LAB — KAPPA/LAMBDA LIGHT CHAINS
Kappa free light chain: 32.9 mg/L — ABNORMAL HIGH (ref 3.3–19.4)
Kappa, lambda light chain ratio: 1.73 — ABNORMAL HIGH (ref 0.26–1.65)
Lambda free light chains: 19 mg/L (ref 5.7–26.3)

## 2023-11-10 ENCOUNTER — Other Ambulatory Visit: Payer: Medicare PPO

## 2023-11-11 LAB — MULTIPLE MYELOMA PANEL, SERUM
Albumin SerPl Elph-Mcnc: 3.5 g/dL (ref 2.9–4.4)
Albumin/Glob SerPl: 1.5 (ref 0.7–1.7)
Alpha 1: 0.2 g/dL (ref 0.0–0.4)
Alpha2 Glob SerPl Elph-Mcnc: 0.6 g/dL (ref 0.4–1.0)
B-Globulin SerPl Elph-Mcnc: 0.8 g/dL (ref 0.7–1.3)
Gamma Glob SerPl Elph-Mcnc: 0.7 g/dL (ref 0.4–1.8)
Globulin, Total: 2.4 g/dL (ref 2.2–3.9)
IgA: 137 mg/dL (ref 64–422)
IgG (Immunoglobin G), Serum: 751 mg/dL (ref 586–1602)
IgM (Immunoglobulin M), Srm: 86 mg/dL (ref 26–217)
Total Protein ELP: 5.9 g/dL — ABNORMAL LOW (ref 6.0–8.5)

## 2023-11-14 ENCOUNTER — Encounter: Payer: Self-pay | Admitting: Internal Medicine

## 2023-11-14 ENCOUNTER — Ambulatory Visit (INDEPENDENT_AMBULATORY_CARE_PROVIDER_SITE_OTHER): Payer: Medicare PPO | Admitting: Internal Medicine

## 2023-11-14 VITALS — BP 140/66 | HR 59 | Ht 71.0 in | Wt 192.0 lb

## 2023-11-14 DIAGNOSIS — I10 Essential (primary) hypertension: Secondary | ICD-10-CM | POA: Diagnosis not present

## 2023-11-14 DIAGNOSIS — K219 Gastro-esophageal reflux disease without esophagitis: Secondary | ICD-10-CM

## 2023-11-14 DIAGNOSIS — E782 Mixed hyperlipidemia: Secondary | ICD-10-CM | POA: Diagnosis not present

## 2023-11-14 DIAGNOSIS — I82531 Chronic embolism and thrombosis of right popliteal vein: Secondary | ICD-10-CM | POA: Diagnosis not present

## 2023-11-14 NOTE — Progress Notes (Signed)
 Established Patient Office Visit  Subjective:  Patient ID: Sarah Riddle, female    DOB: 1941-08-31  Age: 83 y.o. MRN: [id number]  Chief Complaint  Patient presents with   Follow-up    4 month follow up    Patient comes in for follow-up accompanied by her daughter.  She is actually feeling well today.  Her left leg wound has healed nicely under care of wound clinic.  She is wearing compression stockings today.  Denies chest pain, no shortness of breath and no palpitations.  She is no longer on Coumadin , and has resumed Eliquis .  Tolerating it well.  Her labs are not due today.  Will check it at next visit.    No other concerns at this time.   Past Medical History:  Diagnosis Date   A-fib [facility])    Accelerated hypertension 10/31/2022   Age related osteoporosis    Cancer (HCC)    Melanoma x 2 -- Lt Leg -1976 Rt Arm - 2014   Chronic bilateral low back pain    Chronic kidney disease    Complication of anesthesia    DVT (deep venous thrombosis) (HCC) 1960   right leg  was on BC pills   Dysrhythmia    Family history of breast cancer    Family history of colon cancer    Family history of melanoma    Family history of prostate cancer    Fibrocystic breast disease    GERD (gastroesophageal reflux disease)    Headache    migraine   Heart murmur    not heard now.   History of kidney stones    Hyperlipidemia    Hypertension    IBS (irritable bowel syndrome)    Lumbago    Mitral valve prolapse    Neuropathy    Obstructive sleep apnea    Osteoarthritis of shoulder    PONV (postoperative nausea and vomiting)     Past Surgical History:  Procedure Laterality Date   ABDOMINAL HYSTERECTOMY     BACK SURGERY     BREAST BIOPSY Left 04/20/2023   us  bx/ heart clip/ path pending   BREAST BIOPSY Left 04/20/2023   US  LT BREAST BX W LOC DEV 1ST LESION IMG BX SPEC US  GUIDE 04/20/2023 [facility]   BREAST CYST ASPIRATION Left 2001   Negative   CARDIAC ELECTROPHYSIOLOGY MAPPING  AND ABLATION     EYE SURGERY     HUMERUS IM NAIL Right 05/05/2022   Procedure: INTRAMEDULLARY (IM) NAIL HUMERAL;  Surgeon: Laneta Pintos, MD;  Location: [facility];  Service: Orthopedics;  Laterality: Right;   HYSTEROTOMY     L4-L5  2020   titaum rebok cage   LEFT HEART CATH AND CORONARY ANGIOGRAPHY N/A 11/01/2022   Procedure: LEFT HEART CATH AND CORONARY ANGIOGRAPHY and possible PCI and stent;  Surgeon: Cherrie Cornwall, MD;  Location: [facility] INVASIVE CV LAB;  Service: Cardiovascular;  Laterality: N/A;   TOTAL HIP ARTHROPLASTY Left     Social History   Socioeconomic History   Marital status: Married    Spouse name: Not on file   Number of children: Not on file   Years of education: Not on file   Highest education level: Not on file  Occupational History   Not on file  Tobacco Use   Smoking status: Never   Smokeless tobacco: Never  Vaping Use   Vaping status: Never Used  Substance and Sexual Activity   Alcohol use: No   Drug use:  No   Sexual activity: Not Currently  Other Topics Concern   Not on file  Social History Narrative   Not on file   Social Drivers of Health   Financial Resource Strain: Low Risk  (09/23/2023)   Overall Financial Resource Strain (CARDIA)    Difficulty of Paying Living Expenses: Not hard at all  Food Insecurity: No Food Insecurity (09/12/2023)   Hunger Vital Sign    Worried About Running Out of Food in the Last Year: Never true    Ran Out of Food in the Last Year: Never true  Transportation Needs: No Transportation Needs (09/12/2023)   PRAPARE - Administrator, Civil Service (Medical): No    Lack of Transportation (Non-Medical): No  Physical Activity: Inactive (09/23/2023)   Exercise Vital Sign    Days of Exercise per Week: 0 days    Minutes of Exercise per Session: 0 min  Stress: No Stress Concern Present (09/23/2023)   [employer] of Occupational Health - Occupational Stress Questionnaire    Feeling of Stress : Only a little   Social Connections: Not on file  Intimate Partner Violence: Not At Risk (09/12/2023)   Humiliation, Afraid, Rape, and Kick questionnaire    Fear of Current or Ex-Partner: No    Emotionally Abused: No    Physically Abused: No    Sexually Abused: No    Family History  Problem Relation Age of Onset   Colon cancer Mother 67   Prostate cancer Father        dx 71s   Cancer Sister    Acute myelogenous leukemia Sister        dx 30s   Prostate cancer Brother    Melanoma Brother    Prostate cancer Brother    Melanoma Brother    HIV/AIDS Brother 33   Tuberculosis Paternal Aunt    Melanoma Other    Breast cancer Other    Melanoma Niece     Allergies  Allergen Reactions   Iodinated Contrast Media Shortness Of Breath and Other (See Comments)    Can be premedicated  Have been able to tolerate if given premeds (benadryl , prednisone )   Ibuprofen Other (See Comments)    "stomach problems"    Sulfa Antibiotics Other (See Comments)    "Stomach problems"  "Stomach problems"  "Stomach problems"  "Stomach problems"   Doxycycline Diarrhea and Nausea And Vomiting   Tetracyclines & Related Nausea Only and Nausea And Vomiting    Outpatient Medications Prior to Visit  Medication Sig   acetaminophen  (TYLENOL ) 500 MG tablet Take 500 mg by mouth every 6 (six) hours as needed. Once a day   alendronate  (FOSAMAX ) 70 MG tablet Take 1 tablet (70 mg total) by mouth every Thursday. Take with a full glass of water on an empty stomach. (Patient taking differently: Take 70 mg by mouth every Tuesday. Take with a full glass of water on an empty stomach.)   allopurinol  (ZYLOPRIM ) 100 MG tablet Take 1 tablet (100 mg total) by mouth daily.   apixaban  (ELIQUIS ) 5 MG TABS tablet Take 1 tablet (5 mg total) by mouth 2 (two) times daily.   azelastine  (ASTELIN ) 0.1 % nasal spray Place 2 sprays into both nostrils at bedtime. Use in each nostril as directed (Patient taking differently: Place 2 sprays into both  nostrils daily as needed for allergies or rhinitis. Use in each nostril as directed)   Cranberry 125 MG TABS Take 125 mg by mouth daily.   cyanocobalamin  (  VITAMIN B12) 1000 MCG tablet Take 1,000 mcg by mouth daily. Monday to Friday   fluticasone  (FLONASE ) 50 MCG/ACT nasal spray Place 2 sprays into both nostrils daily as needed for allergies or rhinitis.   gabapentin  (NEURONTIN ) 800 MG tablet TAKE 1 TABLET FOUR TIMES DAILY   hydrALAZINE  (APRESOLINE ) 25 MG tablet TAKE 1 TABLET TWICE DAILY   isosorbide  mononitrate (IMDUR ) 60 MG 24 hr tablet TAKE 1 TABLET EVERY DAY (Patient taking differently: Take 60 mg by mouth at bedtime.)   levocetirizine (XYZAL ) 5 MG tablet Take 1 tablet (5 mg total) by mouth every evening.   losartan  (COZAAR ) 25 MG tablet TAKE 1 TABLET EVERY DAY   meclizine  (ANTIVERT ) 12.5 MG tablet Take 1 tablet (12.5 mg total) by mouth 3 (three) times daily as needed for dizziness.   metoprolol  succinate (TOPROL -XL) 25 MG 24 hr tablet TAKE 1 TABLET EVERY DAY (Patient taking differently: Take 25 mg by mouth at bedtime.)   pantoprazole  (PROTONIX ) 40 MG tablet TAKE 1 TABLET EVERY DAY   Polyethyl Glyc-Propyl Glyc PF (SYSTANE HYDRATION PF) 0.4-0.3 % SOLN Apply 1 drop to eye daily as needed (dry eyes).   rosuvastatin  (CRESTOR ) 40 MG tablet TAKE 1 TABLET EVERY DAY (Patient taking differently: Take 40 mg by mouth at bedtime.)   senna-docusate (SENOKOT-S) 8.6-50 MG tablet Take 1 tablet by mouth at bedtime as needed for mild constipation.   sertraline  (ZOLOFT ) 25 MG tablet Take 1 tablet (25 mg total) by mouth 3 (three) times daily.   Vitamin D , Ergocalciferol , 50000 units CAPS Take 1 capsule by mouth once a week.   EPINEPHrine  0.3 mg/0.3 mL IJ SOAJ injection Inject 0.3 mg into the muscle as needed. (Patient not taking: Reported on 11/14/2023)   nitroGLYCERIN  (NITROSTAT ) 0.4 MG SL tablet Place 0.4 mg under the tongue every 5 (five) minutes as needed for chest pain. (Patient not taking: Reported on  11/14/2023)   warfarin (COUMADIN ) 3 MG tablet TAKE 1 TABLET EVERY DAY (Patient not taking: Reported on 11/14/2023)   No facility-administered medications prior to visit.    Review of Systems  Constitutional: Negative.  Negative for chills, fever, malaise/fatigue and weight loss.  HENT: Negative.  Negative for sinus pain and sore throat.   Eyes: Negative.   Respiratory: Negative.  Negative for cough, shortness of breath and stridor.   Cardiovascular: Negative.  Negative for chest pain, palpitations and leg swelling.  Gastrointestinal: Negative.  Negative for abdominal pain, constipation, diarrhea, heartburn, nausea and vomiting.  Genitourinary: Negative.  Negative for dysuria and flank pain.  Musculoskeletal: Negative.  Negative for joint pain and myalgias.  Skin: Negative.   Neurological: Negative.  Negative for dizziness and headaches.  Endo/Heme/Allergies: Negative.   Psychiatric/Behavioral: Negative.  Negative for depression and suicidal ideas. The patient is not nervous/anxious.        Objective:   BP (!) 140/66   Pulse (!) 59   Ht 5\' 11"  (1.803 m)   Wt 192 lb (87.1 kg)   SpO2 98%   BMI 26.78 kg/m   Vitals:   11/14/23 0955  BP: (!) 140/66  Pulse: (!) 59  Height: 5\' 11"  (1.803 m)  Weight: 192 lb (87.1 kg)  SpO2: 98%  BMI (Calculated): 26.79    Physical Exam Vitals and nursing note reviewed.  Constitutional:      Appearance: Normal appearance.  HENT:     Head: Normocephalic and atraumatic.     Nose: Nose normal.     Mouth/Throat:     Mouth: Mucous membranes  are moist.     Pharynx: Oropharynx is clear.  Eyes:     Conjunctiva/sclera: Conjunctivae normal.     Pupils: Pupils are equal, round, and reactive to light.  Cardiovascular:     Rate and Rhythm: Normal rate and regular rhythm.     Pulses: Normal pulses.     Heart sounds: Normal heart sounds. No murmur heard. Pulmonary:     Effort: Pulmonary effort is normal.     Breath sounds: Normal breath sounds. No  wheezing.  Abdominal:     General: Bowel sounds are normal.     Palpations: Abdomen is soft.     Tenderness: There is no abdominal tenderness. There is no right CVA tenderness or left CVA tenderness.  Musculoskeletal:        General: Normal range of motion.     Cervical back: Normal range of motion.     Right lower leg: Edema present.     Left lower leg: Edema present.  Skin:    General: Skin is warm and dry.  Neurological:     General: No focal deficit present.     Mental Status: She is alert and oriented to person, place, and time.  Psychiatric:        Mood and Affect: Mood normal.        Behavior: Behavior normal.      No results found for any visits on 11/14/23.  Recent Results (from the past 2160 hours)  Comprehensive metabolic panel     Status: None   Collection Time: 08/24/23 10:25 AM  Result Value Ref Range   Sodium 141 135 - 145 mmol/L   Potassium 3.7 3.5 - 5.1 mmol/L   Chloride 109 98 - 111 mmol/L   CO2 26 22 - 32 mmol/L   Glucose, Bld 98 70 - 99 mg/dL    Comment: Glucose reference range applies only to samples taken after fasting for at least 8 hours.   BUN 14 8 - 23 mg/dL   Creatinine, Ser 4.40 0.44 - 1.00 mg/dL   Calcium  9.4 8.9 - 10.3 mg/dL   Total Protein 6.5 6.5 - 8.1 g/dL   Albumin 3.7 3.5 - 5.0 g/dL   AST 19 15 - 41 U/L   ALT 12 0 - 44 U/L   Alkaline Phosphatase 62 38 - 126 U/L   Total Bilirubin 0.7 <1.2 mg/dL   GFR, Estimated >10 >27 mL/min    Comment: (NOTE) Calculated using the CKD-EPI Creatinine Equation (2021)    Anion gap 6 5 - 15    Comment: Performed at [facility], [address] [place], [place] [address]  Troponin I (High Sensitivity)     Status: None   Collection Time: 08/24/23 10:25 AM  Result Value Ref Range   Troponin I (High Sensitivity) 7 <18 ng/L    Comment: (NOTE) Elevated high sensitivity troponin I (hsTnI) values and significant  changes across serial measurements may suggest ACS but many other  chronic and  acute conditions are known to elevate hsTnI results.  Refer to the "Links" section for chest pain algorithms and additional  guidance. Performed at [facility], [address] [place], [place] [address]   CBC with Differential     Status: Abnormal   Collection Time: 08/24/23 10:25 AM  Result Value Ref Range   WBC 5.4 4.0 - 10.5 K/uL   RBC 3.73 (L) 3.87 - 5.11 MIL/uL   Hemoglobin 11.1 (L) 12.0 - 15.0 g/dL   HCT 74.2 (L)  36.0 - 46.0 %   MCV 90.6 80.0 - 100.0 fL   MCH 29.8 26.0 - 34.0 pg   MCHC 32.8 30.0 - 36.0 g/dL   RDW 16.1 09.6 - 04.5 %   Platelets 201 150 - 400 K/uL   nRBC 0.0 0.0 - 0.2 %   Neutrophils Relative % 65 %   Neutro Abs 3.5 1.7 - 7.7 K/uL   Lymphocytes Relative 23 %   Lymphs Abs 1.2 0.7 - 4.0 K/uL   Monocytes Relative 7 %   Monocytes Absolute 0.4 0.1 - 1.0 K/uL   Eosinophils Relative 4 %   Eosinophils Absolute 0.2 0.0 - 0.5 K/uL   Basophils Relative 1 %   Basophils Absolute 0.0 0.0 - 0.1 K/uL   Immature Granulocytes 0 %   Abs Immature Granulocytes 0.02 0.00 - 0.07 K/uL    Comment: Performed at [facility], [address] [place], [place] [address]  Urinalysis, Routine w reflex microscopic -Urine, Unspecified Source     Status: Abnormal   Collection Time: 08/24/23 10:25 AM  Result Value Ref Range   Color, Urine STRAW (A) YELLOW   APPearance CLEAR (A) CLEAR   Specific Gravity, Urine 1.004 (L) 1.005 - 1.030   pH 8.0 5.0 - 8.0   Glucose, UA NEGATIVE NEGATIVE mg/dL   Hgb urine dipstick NEGATIVE NEGATIVE   Bilirubin Urine NEGATIVE NEGATIVE   Ketones, ur NEGATIVE NEGATIVE mg/dL   Protein, ur NEGATIVE NEGATIVE mg/dL   Nitrite NEGATIVE NEGATIVE   Leukocytes,Ua TRACE (A) NEGATIVE   RBC / HPF 0 0 - 5 RBC/hpf   WBC, UA 0-5 0 - 5 WBC/hpf   Bacteria, UA RARE (A) NONE SEEN   Squamous Epithelial / HPF 0-5 0 - 5 /HPF    Comment: Performed at [facility], [address] [place], [place] [address]  Troponin I (High Sensitivity)      Status: None   Collection Time: 08/24/23 11:49 AM  Result Value Ref Range   Troponin I (High Sensitivity) 7 <18 ng/L    Comment: (NOTE) Elevated high sensitivity troponin I (hsTnI) values and significant  changes across serial measurements may suggest ACS but many other  chronic and acute conditions are known to elevate hsTnI results.  Refer to the "Links" section for chest pain algorithms and additional  guidance. Performed at [facility], [address] [place], [place] [address]   Protime-INR     Status: Abnormal   Collection Time: 08/24/23 11:49 AM  Result Value Ref Range   Prothrombin  Time 28.0 (H) 11.4 - 15.2 seconds   INR 2.6 (H) 0.8 - 1.2    Comment: (NOTE) INR goal varies based on device and disease states. Performed at [facility], [address] [place], [place] [address]   Lactic acid, plasma     Status: None   Collection Time: 08/30/23 11:47 AM  Result Value Ref Range   Lactic Acid, Venous 0.7 0.5 - 1.9 mmol/L    Comment: Performed at [facility], [address] [place], [place] [address]  Comprehensive metabolic panel     Status: Abnormal   Collection Time: 08/30/23 11:47 AM  Result Value Ref Range   Sodium 139 135 - 145 mmol/L   Potassium 4.3 3.5 - 5.1 mmol/L   Chloride 107 98 - 111 mmol/L   CO2 27 22 - 32 mmol/L   Glucose, Bld 91 70 - 99 mg/dL    Comment: Glucose reference range applies only to samples taken  after fasting for at least 8 hours.   BUN 18 8 - 23 mg/dL   Creatinine, Ser 1.61 0.44 - 1.00 mg/dL   Calcium  9.0 8.9 - 10.3 mg/dL   Total Protein 6.3 (L) 6.5 - 8.1 g/dL   Albumin 3.5 3.5 - 5.0 g/dL   AST 16 15 - 41 U/L   ALT 12 0 - 44 U/L   Alkaline Phosphatase 61 38 - 126 U/L   Total Bilirubin 0.7 <1.2 mg/dL   GFR, Estimated >09 >60 mL/min    Comment: (NOTE) Calculated using the CKD-EPI Creatinine Equation (2021)    Anion gap 5 5 - 15    Comment: Performed at [facility], [address]  [place], [place] [address]  CBC with Differential     Status: Abnormal   Collection Time: 08/30/23 11:47 AM  Result Value Ref Range   WBC 7.1 4.0 - 10.5 K/uL   RBC 3.49 (L) 3.87 - 5.11 MIL/uL   Hemoglobin 10.3 (L) 12.0 - 15.0 g/dL   HCT 81.1 (L) 91.4 - 78.2 %   MCV 94.6 80.0 - 100.0 fL   MCH 29.5 26.0 - 34.0 pg   MCHC 31.2 30.0 - 36.0 g/dL   RDW 95.6 21.3 - 08.6 %   Platelets 225 150 - 400 K/uL   nRBC 0.0 0.0 - 0.2 %   Neutrophils Relative % 61 %   Neutro Abs 4.4 1.7 - 7.7 K/uL   Lymphocytes Relative 25 %   Lymphs Abs 1.8 0.7 - 4.0 K/uL   Monocytes Relative 8 %   Monocytes Absolute 0.5 0.1 - 1.0 K/uL   Eosinophils Relative 5 %   Eosinophils Absolute 0.3 0.0 - 0.5 K/uL   Basophils Relative 1 %   Basophils Absolute 0.1 0.0 - 0.1 K/uL   Immature Granulocytes 0 %   Abs Immature Granulocytes 0.02 0.00 - 0.07 K/uL    Comment: Performed at [facility], [address] [place], [place] [address]  Protime-INR     Status: Abnormal   Collection Time: 08/30/23  6:18 PM  Result Value Ref Range   Prothrombin  Time 40.2 (H) 11.4 - 15.2 seconds   INR 4.1 (HH) 0.8 - 1.2    Comment: REPEATED TO VERIFY CRITICAL RESULT CALLED TO, READ BACK BY AND VERIFIED WITH: ALEX OLIDEN AT 1915 ON 08/30/23 BY SS (NOTE) INR goal varies based on device and disease states. Performed at [facility], [address] [place], [place] [address]   Basic metabolic panel     Status: None   Collection Time: 08/31/23  5:19 AM  Result Value Ref Range   Sodium 137 135 - 145 mmol/L   Potassium 4.1 3.5 - 5.1 mmol/L   Chloride 105 98 - 111 mmol/L   CO2 25 22 - 32 mmol/L   Glucose, Bld 95 70 - 99 mg/dL    Comment: Glucose reference range applies only to samples taken after fasting for at least 8 hours.   BUN 18 8 - 23 mg/dL   Creatinine, Ser 2.84 0.44 - 1.00 mg/dL   Calcium  9.1 8.9 - 10.3 mg/dL   GFR, Estimated >13 >24 mL/min    Comment: (NOTE) Calculated using the CKD-EPI Creatinine Equation  (2021)    Anion gap 7 5 - 15    Comment: Performed at [facility], [address], [place], [place] [address]  CBC     Status: Abnormal   Collection Time: 08/31/23  5:19 AM  Result Value Ref  Range   WBC 7.1 4.0 - 10.5 K/uL   RBC 3.43 (L) 3.87 - 5.11 MIL/uL   Hemoglobin 10.1 (L) 12.0 - 15.0 g/dL   HCT 14.7 (L) 82.9 - 56.2 %   MCV 89.8 80.0 - 100.0 fL   MCH 29.4 26.0 - 34.0 pg   MCHC 32.8 30.0 - 36.0 g/dL   RDW 13.0 86.5 - 78.4 %   Platelets 220 150 - 400 K/uL   nRBC 0.0 0.0 - 0.2 %    Comment: Performed at [facility], [address] [place], [place] [address]  Protime-INR     Status: Abnormal   Collection Time: 08/31/23  5:19 AM  Result Value Ref Range   Prothrombin  Time 38.5 (H) 11.4 - 15.2 seconds   INR 3.9 (H) 0.8 - 1.2    Comment: (NOTE) INR goal varies based on device and disease states. Performed at [facility], [address] [place], [place] [address]   Protime-INR     Status: Abnormal   Collection Time: 09/01/23  3:09 AM  Result Value Ref Range   Prothrombin  Time 32.3 (H) 11.4 - 15.2 seconds   INR 3.1 (H) 0.8 - 1.2    Comment: (NOTE) INR goal varies based on device and disease states. Performed at [facility], [address] [place], [place] [address]   CBC with Differential/Platelet     Status: Abnormal   Collection Time: 09/01/23  3:09 AM  Result Value Ref Range   WBC 6.6 4.0 - 10.5 K/uL   RBC 3.33 (L) 3.87 - 5.11 MIL/uL   Hemoglobin 9.8 (L) 12.0 - 15.0 g/dL   HCT 10.2 (L) 72.5 - 36.6 %   MCV 89.2 80.0 - 100.0 fL   MCH 29.4 26.0 - 34.0 pg   MCHC 33.0 30.0 - 36.0 g/dL   RDW 44.0 34.7 - 42.5 %   Platelets 207 150 - 400 K/uL   nRBC 0.0 0.0 - 0.2 %   Neutrophils Relative % 57 %   Neutro Abs 3.8 1.7 - 7.7 K/uL   Lymphocytes Relative 29 %   Lymphs Abs 1.9 0.7 - 4.0 K/uL   Monocytes Relative 8 %   Monocytes Absolute 0.5 0.1 - 1.0 K/uL   Eosinophils Relative 5 %   Eosinophils Absolute 0.3 0.0 - 0.5 K/uL    Basophils Relative 1 %   Basophils Absolute 0.0 0.0 - 0.1 K/uL   Immature Granulocytes 0 %   Abs Immature Granulocytes 0.02 0.00 - 0.07 K/uL    Comment: Performed at [facility], [address], [place], [place] [address]  Basic metabolic panel     Status: None   Collection Time: 09/01/23  3:09 AM  Result Value Ref Range   Sodium 138 135 - 145 mmol/L   Potassium 4.2 3.5 - 5.1 mmol/L   Chloride 103 98 - 111 mmol/L   CO2 28 22 - 32 mmol/L   Glucose, Bld 99 70 - 99 mg/dL    Comment: Glucose reference range applies only to samples taken after fasting for at least 8 hours.   BUN 17 8 - 23 mg/dL   Creatinine, Ser 7.56 0.44 - 1.00 mg/dL   Calcium  9.0 8.9 - 10.3 mg/dL   GFR, Estimated >43 >32 mL/min    Comment: (NOTE) Calculated using the CKD-EPI Creatinine Equation (2021)    Anion gap 7 5 - 15    Comment: Performed at [facility], [address], [place], [place] [address]  Protime-INR     Status: Abnormal   Collection Time: 09/02/23  5:04 AM  Result Value Ref Range   Prothrombin  Time 28.0 (H) 11.4 - 15.2 seconds   INR 2.6 (H) 0.8 - 1.2    Comment: (NOTE) INR goal varies based on device and disease states. Performed at [facility], [address] [place], [place] [address]   CBC with Differential/Platelet     Status: Abnormal   Collection Time: 09/02/23  5:04 AM  Result Value Ref Range   WBC 6.3 4.0 - 10.5 K/uL   RBC 3.41 (L) 3.87 - 5.11 MIL/uL   Hemoglobin 10.1 (L) 12.0 - 15.0 g/dL   HCT 02.7 (L) 25.3 - 66.4 %   MCV 92.1 80.0 - 100.0 fL   MCH 29.6 26.0 - 34.0 pg   MCHC 32.2 30.0 - 36.0 g/dL   RDW 40.3 47.4 - 25.9 %   Platelets 212 150 - 400 K/uL   nRBC 0.0 0.0 - 0.2 %   Neutrophils Relative % 56 %   Neutro Abs 3.5 1.7 - 7.7 K/uL   Lymphocytes Relative 29 %   Lymphs Abs 1.8 0.7 - 4.0 K/uL   Monocytes Relative 7 %   Monocytes Absolute 0.5 0.1 - 1.0 K/uL   Eosinophils Relative 7 %   Eosinophils Absolute 0.4 0.0 - 0.5 K/uL   Basophils  Relative 1 %   Basophils Absolute 0.1 0.0 - 0.1 K/uL   Immature Granulocytes 0 %   Abs Immature Granulocytes 0.02 0.00 - 0.07 K/uL    Comment: Performed at [facility], [address], [place], [place] [address]  Basic metabolic panel     Status: None   Collection Time: 09/02/23  5:04 AM  Result Value Ref Range   Sodium 138 135 - 145 mmol/L   Potassium 4.0 3.5 - 5.1 mmol/L   Chloride 104 98 - 111 mmol/L   CO2 26 22 - 32 mmol/L   Glucose, Bld 95 70 - 99 mg/dL    Comment: Glucose reference range applies only to samples taken after fasting for at least 8 hours.   BUN 20 8 - 23 mg/dL   Creatinine, Ser 5.64 0.44 - 1.00 mg/dL   Calcium  9.2 8.9 - 10.3 mg/dL   GFR, Estimated >33 >29 mL/min    Comment: (NOTE) Calculated using the CKD-EPI Creatinine Equation (2021)    Anion gap 8 5 - 15    Comment: Performed at [facility], [address] [place], [place] [address]  Hemoglobin A1c     Status: None   Collection Time: 09/23/23 10:07 AM  Result Value Ref Range   Hgb A1c MFr Bld 5.4 4.8 - 5.6 %    Comment:          Prediabetes: 5.7 - 6.4          Diabetes: >6.4          Glycemic control for adults with diabetes: <7.0    Est. average glucose Bld gHb Est-mCnc 108 mg/dL  TSH     Status: None   Collection Time: 09/23/23 10:07 AM  Result Value Ref Range   TSH 1.040 0.450 - 4.500 uIU/mL  CMP14+EGFR     Status: Abnormal   Collection Time: 09/23/23 10:07 AM  Result Value Ref Range   Glucose 85 70 - 99 mg/dL   BUN 13 8 - 27 mg/dL   Creatinine, Ser 1.66 0.57 - 1.00 mg/dL   eGFR 70 >06 TK/ZSW/1.09  BUN/Creatinine Ratio 16 12 - 28   Sodium 143 134 - 144 mmol/L   Potassium 4.4 3.5 - 5.2 mmol/L   Chloride 106 96 - 106 mmol/L   CO2 24 20 - 29 mmol/L   Calcium  9.2 8.7 - 10.3 mg/dL   Total Protein 5.6 (L) 6.0 - 8.5 g/dL   Albumin 3.8 3.7 - 4.7 g/dL   Globulin, Total 1.8 1.5 - 4.5 g/dL   Bilirubin Total 0.4 0.0 - 1.2 mg/dL   Alkaline Phosphatase 81 44 - 121 IU/L   AST 17  0 - 40 IU/L   ALT 10 0 - 32 IU/L  Lipid panel     Status: None   Collection Time: 09/23/23 10:07 AM  Result Value Ref Range   Cholesterol, Total 128 100 - 199 mg/dL   Triglycerides 161 0 - 149 mg/dL   HDL 55 >09 mg/dL   VLDL Cholesterol Cal 23 5 - 40 mg/dL   LDL Chol Calc (NIH) 50 0 - 99 mg/dL   Chol/HDL Ratio 2.3 0.0 - 4.4 ratio    Comment:                                   T. Chol/HDL Ratio                                             Men  Women                               1/2 Avg.Risk  3.4    3.3                                   Avg.Risk  5.0    4.4                                2X Avg.Risk  9.6    7.1                                3X Avg.Risk 23.4   11.0   Protime-INR     Status: Abnormal   Collection Time: 09/23/23 10:12 AM  Result Value Ref Range   INR 8.7 (HH) 0.9 - 1.2    Comment: Abnormal result has been verified by repeat testing. If inconsistent with clinical condition, please resubmit another specimen for verification and to rule out specimen handling problems for which this test is very sensitive. Reference interval is for non-anticoagulated patients. Suggested INR therapeutic range for Vitamin K antagonist therapy:    Standard Dose (moderate intensity                   therapeutic range):       2.0 - 3.0    Higher intensity therapeutic range       2.5 - 3.5    Prothrombin  Time 81.8 (H) 9.1 - 12.0 sec  CBC with Differential     Status: Abnormal   Collection Time: 09/26/23 10:07 AM  Result Value Ref Range   WBC 5.7 4.0 - 10.5 K/uL  RBC 3.51 (L) 3.87 - 5.11 MIL/uL   Hemoglobin 10.5 (L) 12.0 - 15.0 g/dL   HCT 40.9 (L) 81.1 - 91.4 %   MCV 95.7 80.0 - 100.0 fL   MCH 29.9 26.0 - 34.0 pg   MCHC 31.3 30.0 - 36.0 g/dL   RDW 78.2 95.6 - 21.3 %   Platelets 198 150 - 400 K/uL   nRBC 0.0 0.0 - 0.2 %   Neutrophils Relative % 60 %   Neutro Abs 3.5 1.7 - 7.7 K/uL   Lymphocytes Relative 27 %   Lymphs Abs 1.5 0.7 - 4.0 K/uL   Monocytes Relative 7 %   Monocytes  Absolute 0.4 0.1 - 1.0 K/uL   Eosinophils Relative 5 %   Eosinophils Absolute 0.3 0.0 - 0.5 K/uL   Basophils Relative 1 %   Basophils Absolute 0.1 0.0 - 0.1 K/uL   Immature Granulocytes 0 %   Abs Immature Granulocytes 0.02 0.00 - 0.07 K/uL    Comment: Performed at [facility], [address] [place], [place] [address]  Protime-INR     Status: Abnormal   Collection Time: 09/26/23 10:07 AM  Result Value Ref Range   Prothrombin  Time 54.7 (H) 11.4 - 15.2 seconds   INR 6.1 (HH) 0.8 - 1.2    Comment: REPEATED TO VERIFY CRITICAL RESULT CALLED TO, READ BACK BY AND VERIFIED WITH: HEATHER GREEN 09/26/23 1126 KLW (NOTE) INR goal varies based on device and disease states. Performed at [facility], [address] [place], [place] [address]   APTT     Status: Abnormal   Collection Time: 09/26/23 10:07 AM  Result Value Ref Range   aPTT 101 (H) 24 - 36 seconds    Comment:        IF BASELINE aPTT IS ELEVATED, SUGGEST PATIENT RISK ASSESSMENT BE USED TO DETERMINE APPROPRIATE ANTICOAGULANT THERAPY. Performed at [facility], [address] [place], [place] [address]   Comprehensive metabolic panel     Status: Abnormal   Collection Time: 09/26/23 10:07 AM  Result Value Ref Range   Sodium 142 135 - 145 mmol/L   Potassium 3.7 3.5 - 5.1 mmol/L   Chloride 108 98 - 111 mmol/L   CO2 29 22 - 32 mmol/L   Glucose, Bld 107 (H) 70 - 99 mg/dL    Comment: Glucose reference range applies only to samples taken after fasting for at least 8 hours.   BUN 15 8 - 23 mg/dL   Creatinine, Ser 4.13 0.44 - 1.00 mg/dL   Calcium  9.2 8.9 - 10.3 mg/dL   Total Protein 6.5 6.5 - 8.1 g/dL   Albumin 3.8 3.5 - 5.0 g/dL   AST 21 15 - 41 U/L   ALT 14 0 - 44 U/L   Alkaline Phosphatase 64 38 - 126 U/L   Total Bilirubin 0.6 <1.2 mg/dL   GFR, Estimated >24 >40 mL/min    Comment: (NOTE) Calculated using the CKD-EPI Creatinine Equation (2021)    Anion gap 5 5 - 15    Comment: Performed at  [facility], [address] [place], [place] [address]  Protime-INR     Status: Abnormal   Collection Time: 09/30/23  8:49 AM  Result Value Ref Range   INR 3.0 (H) 0.9 - 1.2    Comment: Reference interval is for non-anticoagulated patients. Suggested INR therapeutic range for Vitamin K antagonist therapy:    Standard Dose (moderate intensity  therapeutic range):       2.0 - 3.0    Higher intensity therapeutic range       2.5 - 3.5    Prothrombin  Time 30.8 (H) 9.1 - 12.0 sec  Lactate dehydrogenase     Status: None   Collection Time: 11/08/23 10:05 AM  Result Value Ref Range   LDH 134 98 - 192 U/L    Comment: Performed at [facility], [address] [place], [place] [address]  Vitamin B12     Status: None   Collection Time: 11/08/23 10:05 AM  Result Value Ref Range   Vitamin B-12 318 180 - 914 pg/mL    Comment: (NOTE) This assay is not validated for testing neonatal or myeloproliferative syndrome specimens for Vitamin B12 levels. Performed at [facility], [address], [place], [place] [address]   Ferritin     Status: None   Collection Time: 11/08/23 10:05 AM  Result Value Ref Range   Ferritin 18 11 - 307 ng/mL    Comment: Performed at [facility], [address] [place], [place] [address]  Iron and TIBC     Status: None   Collection Time: 11/08/23 10:05 AM  Result Value Ref Range   Iron 60 28 - 170 ug/dL   TIBC 914 782 - 956 ug/dL   Saturation Ratios 18 10.4 - 31.8 %   UIBC 268 ug/dL    Comment: Performed at [facility], [address] [place], [place] [address]  Kappa/lambda light chains     Status: Abnormal   Collection Time: 11/08/23 10:05 AM  Result Value Ref Range   Kappa free light chain 32.9 (H) 3.3 - 19.4 mg/L   Lambda free light chains 19.0 5.7 - 26.3 mg/L   Kappa, lambda light chain ratio 1.73 (H) 0.26 - 1.65    Comment: (NOTE) Performed At: [facility] [address]  [place], [place] [id number] Pearlean Botts MD [id number]   CBC with Differential/Platelet     Status: Abnormal   Collection Time: 11/08/23 10:05 AM  Result Value Ref Range   WBC 4.4 4.0 - 10.5 K/uL   RBC 3.88 3.87 - 5.11 MIL/uL   Hemoglobin 11.2 (L) 12.0 - 15.0 g/dL   HCT 02.7 (L) 25.3 - 66.4 %   MCV 92.3 80.0 - 100.0 fL   MCH 28.9 26.0 - 34.0 pg   MCHC 31.3 30.0 - 36.0 g/dL   RDW 40.3 47.4 - 25.9 %   Platelets 186 150 - 400 K/uL   nRBC 0.0 0.0 - 0.2 %   Neutrophils Relative % 55 %   Neutro Abs 2.4 1.7 - 7.7 K/uL   Lymphocytes Relative 34 %   Lymphs Abs 1.5 0.7 - 4.0 K/uL   Monocytes Relative 6 %   Monocytes Absolute 0.3 0.1 - 1.0 K/uL   Eosinophils Relative 4 %   Eosinophils Absolute 0.2 0.0 - 0.5 K/uL   Basophils Relative 1 %   Basophils Absolute 0.0 0.0 - 0.1 K/uL   Immature Granulocytes 0 %   Abs Immature Granulocytes 0.01 0.00 - 0.07 K/uL    Comment: Performed at [facility], [address] [place], [place] [address]  Retic Panel     Status: Abnormal   Collection Time: 11/08/23 10:06 AM  Result Value Ref Range   Retic Ct Pct 0.8 0.4 - 3.1 %   RBC. 3.80 (L) 3.87 - 5.11 MIL/uL   Retic Count, Absolute 31.5 19.0 - 186.0  K/uL   Immature Retic Fract 4.9 2.3 - 15.9 %   Reticulocyte Hemoglobin 30.4 >27.9 pg    Comment: Performed at [facility], [address] [place], [place] [address]  Multiple Myeloma Panel (SPEP&IFE w/QIG)     Status: Abnormal   Collection Time: 11/08/23 10:06 AM  Result Value Ref Range   IgG (Immunoglobin G), Serum 751 586 - 1,602 mg/dL   IgA 604 64 - 540 mg/dL   IgM (Immunoglobulin M), Srm 86 26 - 217 mg/dL   Total Protein ELP 5.9 (L) 6.0 - 8.5 g/dL   Albumin SerPl Elph-Mcnc 3.5 2.9 - 4.4 g/dL   Alpha 1 0.2 0.0 - 0.4 g/dL   Alpha2 Glob SerPl Elph-Mcnc 0.6 0.4 - 1.0 g/dL   B-Globulin SerPl Elph-Mcnc 0.8 0.7 - 1.3 g/dL   Gamma Glob SerPl Elph-Mcnc 0.7 0.4 - 1.8 g/dL   M Protein SerPl Elph-Mcnc Not Observed Not Observed g/dL    Globulin, Total 2.4 2.2 - 3.9 g/dL   Albumin/Glob SerPl 1.5 0.7 - 1.7   IFE 1 Comment     Comment: (NOTE) The immunofixation pattern appears unremarkable. Evidence of monoclonal protein is not apparent.    Please Note Comment     Comment: (NOTE) Protein electrophoresis scan will follow via computer, mail, or courier delivery. Performed At: [facility] [address] [place], [place] [id number] Pearlean Botts MD [id number]       Assessment & Plan:  Patient to continue all her medications.  Keep her legs elevated when possible.  Continue compression stockings and all her medications. Problem List Items Addressed This Visit     GERD (gastroesophageal reflux disease)   Essential hypertension, benign - Primary   HLD (hyperlipidemia)   Other Visit Diagnoses       Chronic deep vein thrombosis (DVT) of popliteal vein of right lower extremity (HCC)           Return in about 6 weeks (around 12/26/2023).   Total time spent: 30 minutes  Aisha Hove, MD  11/14/2023   This document may have been prepared by [facility] Voice Recognition software and as such may include unintentional dictation errors.

## 2023-11-16 ENCOUNTER — Inpatient Hospital Stay: Payer: Medicare PPO | Admitting: Oncology

## 2023-11-16 ENCOUNTER — Encounter: Payer: Self-pay | Admitting: Oncology

## 2023-11-16 DIAGNOSIS — K760 Fatty (change of) liver, not elsewhere classified: Secondary | ICD-10-CM | POA: Diagnosis not present

## 2023-11-16 DIAGNOSIS — R011 Cardiac murmur, unspecified: Secondary | ICD-10-CM | POA: Diagnosis not present

## 2023-11-16 DIAGNOSIS — D649 Anemia, unspecified: Secondary | ICD-10-CM | POA: Diagnosis not present

## 2023-11-16 DIAGNOSIS — K219 Gastro-esophageal reflux disease without esophagitis: Secondary | ICD-10-CM | POA: Diagnosis not present

## 2023-11-16 DIAGNOSIS — M810 Age-related osteoporosis without current pathological fracture: Secondary | ICD-10-CM | POA: Diagnosis not present

## 2023-11-16 DIAGNOSIS — I4891 Unspecified atrial fibrillation: Secondary | ICD-10-CM | POA: Diagnosis not present

## 2023-11-16 DIAGNOSIS — K580 Irritable bowel syndrome with diarrhea: Secondary | ICD-10-CM | POA: Diagnosis not present

## 2023-11-16 DIAGNOSIS — I129 Hypertensive chronic kidney disease with stage 1 through stage 4 chronic kidney disease, or unspecified chronic kidney disease: Secondary | ICD-10-CM | POA: Diagnosis not present

## 2023-11-16 NOTE — Progress Notes (Signed)
Hematology/Oncology Progress note [phone] [phone]       Patient Care Team: Margaretann Loveless, MD as PCP - General (Internal Medicine) Rickard Patience, MD as Consulting Physician (Oncology) Wenda Overland, [place] as Social Worker  ASSESSMENT & PLAN:   Hereditary hemochromatosis [name]) #Hereditary hemochromatosis, homozygous HD63 mutation.  Labs reviewed and discussed with patient. Lab Results  Component Value Date   HGB 11.2 (L) 11/08/2023   TIBC 328 11/08/2023   IRONPCTSAT 18 11/08/2023   FERRITIN 18 11/08/2023     No signs of iron over load, no need for phlebotomy   Fatty liver disease, nonalcoholic Follow up with GI  Normocytic anemia Iron level is not consistent with iron deficiency. Normal folate.  Continue  empiric B12 daily. Hb has improved.  No M protein, mildly elevated kappa/lamda light chain ratio, non specific.  Close monitor  Orders Placed This Encounter  Procedures   CMP (Cancer Center only)    Standing Status:   Future    Expected Date:   05/15/2024    Expiration Date:   11/15/2024   CBC with Differential (Cancer Center Only)    Standing Status:   Future    Expected Date:   05/15/2024    Expiration Date:   11/15/2024   Iron and TIBC    Standing Status:   Future    Expected Date:   05/15/2024    Expiration Date:   11/15/2024   Ferritin    Standing Status:   Future    Expected Date:   05/15/2024    Expiration Date:   11/15/2024   Vitamin B12    Standing Status:   Future    Expected Date:   05/15/2024    Expiration Date:   11/15/2024   Follow up in 6 months.  All questions were answered. The patient knows to call the clinic with any problems, questions or concerns.  Rickard Patience, MD, PhD [facility] Hematology Oncology 11/16/2023   CHIEF COMPLAINTS/REASON FOR VISIT:  Follow up for homozygous hemochromatosis gene mutation, abnormal ultrasound liver  HISTORY OF PRESENTING ILLNESS:   Sarah Riddle is a  83 y.o.  female  with PMH listed below was seen in consultation at the request of  Margaretann Loveless, MD  for evaluation of anemia Reviewed records from primary care provider 04/09/2021, patient had a CBC done which showed a hemoglobin of 10.9, MCV 89.  Normal total WBC, normal platelet count.  Creatinine is slightly elevated and 1.13, EGFR 49, normal bilirubin. TSH 1.48 6 05/12/2021, repeat hemoglobin 10.4, MCV 91, normal platelet and total WBC. Patient reports some fatigue.  Denies any abdominal pain, black or bloody stool. Patient is on chronic anticoagulation with Coumadin for history of A. fib. Patient reports that she has previously taken oral iron supplementation which caused diarrhea.  She has a personal history of melanoma x2 status post resection by dermatologist.  She has a family history significant for father and 2 brothers with prostate cancer, a sister with acute leukemia.  Mother with colon cancer.  Patient reports to have colonoscopy done recently.  No colonoscopy records and currently picks  10/20/2020, [place] right upper quadrant showed coarse increased echogenicity of the parenchyma with no focal mass in the liver.  patient has establish care with gastroenterology Dr. Timothy Lasso 01/04/2022, ultrasound elastography of the liver showed diffusely increased hepatic parenchymal echogenicity with decreased acoustic through transmission most commonly reflects hepatic steatosis.  No overt hepatic lesion discovered. 01/18/2022, ultrasound right upper  quadrant showed increased echogenicity of the liver parenchyma.  Family history of cancer, genetic testing was negative for pathogenic mutations.  INTERVAL HISTORY Sarah Riddle is a 83 y.o. female who has above history reviewed by me today presents for follow up visit for homozygous hemochromatosis gene mutation, abnormal liver ultrasound. Patient has no new complaints She is on Eliquis for anticoagulation. Used to take  warfarin due to expense.     Review of  Systems  Constitutional:  Negative for appetite change, chills, fatigue and fever.  HENT:   Negative for hearing loss and voice change.   Eyes:  Negative for eye problems.  Respiratory:  Negative for chest tightness and cough.   Cardiovascular:  Negative for chest pain.  Gastrointestinal:  Negative for abdominal distention, abdominal pain and blood in stool.  Endocrine: Negative for hot flashes.  Genitourinary:  Negative for difficulty urinating and frequency.   Musculoskeletal:  Negative for arthralgias.  Skin:  Negative for itching and rash.  Neurological:  Negative for extremity weakness.  Hematological:  Negative for adenopathy.  Psychiatric/Behavioral:  Negative for confusion.     MEDICAL HISTORY:  Past Medical History:  Diagnosis Date   A-fib [facility])    Accelerated hypertension 10/31/2022   Age related osteoporosis    Cancer (HCC)    Melanoma x 2 -- Lt Leg -1976 Rt Arm - 2014   Chronic bilateral low back pain    Chronic kidney disease    Complication of anesthesia    DVT (deep venous thrombosis) (HCC) 1960   right leg  was on BC pills   Dysrhythmia    Family history of breast cancer    Family history of colon cancer    Family history of melanoma    Family history of prostate cancer    Fibrocystic breast disease    GERD (gastroesophageal reflux disease)    Headache    migraine   Heart murmur    not heard now.   History of kidney stones    Hyperlipidemia    Hypertension    IBS (irritable bowel syndrome)    Lumbago    Mitral valve prolapse    Neuropathy    Obstructive sleep apnea    Osteoarthritis of shoulder    PONV (postoperative nausea and vomiting)     SURGICAL HISTORY: Past Surgical History:  Procedure Laterality Date   ABDOMINAL HYSTERECTOMY     BACK SURGERY     BREAST BIOPSY Left 04/20/2023   [place] bx/ heart clip/ path pending   BREAST BIOPSY Left 04/20/2023   [place] LT BREAST BX W LOC DEV 1ST LESION IMG BX SPEC US GUIDE 04/20/2023 [facility]   BREAST  CYST ASPIRATION Left 2001   Negative   CARDIAC ELECTROPHYSIOLOGY MAPPING AND ABLATION     EYE SURGERY     HUMERUS IM NAIL Right 05/05/2022   Procedure: INTRAMEDULLARY (IM) NAIL HUMERAL;  Surgeon: Roby Lofts, MD;  Location: [facility] OR;  Service: Orthopedics;  Laterality: Right;   HYSTEROTOMY     L4-L5  2020   titaum rebok cage   LEFT HEART CATH AND CORONARY ANGIOGRAPHY N/A 11/01/2022   Procedure: LEFT HEART CATH AND CORONARY ANGIOGRAPHY and possible PCI and stent;  Surgeon: Laurier Nancy, MD;  Location: [facility] INVASIVE CV LAB;  Service: Cardiovascular;  Laterality: N/A;   TOTAL HIP ARTHROPLASTY Left     SOCIAL HISTORY: Social History   Socioeconomic History   Marital status: Married    Spouse name: Not on file  Number of children: Not on file   Years of education: Not on file   Highest education level: Not on file  Occupational History   Not on file  Tobacco Use   Smoking status: Never   Smokeless tobacco: Never  Vaping Use   Vaping status: Never Used  Substance and Sexual Activity   Alcohol use: No   Drug use: No   Sexual activity: Not Currently  Other Topics Concern   Not on file  Social History Narrative   Not on file   Social Drivers of Health   Financial Resource Strain: Low Risk  (09/23/2023)   Overall Financial Resource Strain (CARDIA)    Difficulty of Paying Living Expenses: Not hard at all  Food Insecurity: No Food Insecurity (09/12/2023)   Hunger Vital Sign    Worried About Running Out of Food in the Last Year: Never true    Ran Out of Food in the Last Year: Never true  Transportation Needs: No Transportation Needs (09/12/2023)   PRAPARE - Administrator, Civil Service (Medical): No    Lack of Transportation (Non-Medical): No  Physical Activity: Inactive (09/23/2023)   Exercise Vital Sign    Days of Exercise per Week: 0 days    Minutes of Exercise per Session: 0 min  Stress: No Stress Concern Present (09/23/2023)   [employer] of  Occupational Health - Occupational Stress Questionnaire    Feeling of Stress : Only a little  Social Connections: Not on file  Intimate Partner Violence: Not At Risk (09/12/2023)   Humiliation, Afraid, Rape, and Kick questionnaire    Fear of Current or Ex-Partner: No    Emotionally Abused: No    Physically Abused: No    Sexually Abused: No    FAMILY HISTORY: Family History  Problem Relation Age of Onset   Colon cancer Mother 73   Prostate cancer Father        dx 59s   Cancer Sister    Acute myelogenous leukemia Sister        dx 63s   Prostate cancer Brother    Melanoma Brother    Prostate cancer Brother    Melanoma Brother    HIV/AIDS Brother 82   Tuberculosis Paternal Aunt    Melanoma Other    Breast cancer Other    Melanoma Niece     ALLERGIES:  is allergic to iodinated contrast media, ibuprofen, sulfa antibiotics, doxycycline, and tetracyclines & related.  MEDICATIONS:  Current Outpatient Medications  Medication Sig Dispense Refill   acetaminophen (TYLENOL) 500 MG tablet Take 500 mg by mouth every 6 (six) hours as needed. Once a day     alendronate (FOSAMAX) 70 MG tablet Take 1 tablet (70 mg total) by mouth every Thursday. Take with a full glass of water on an empty stomach. (Patient taking differently: Take 70 mg by mouth every Tuesday. Take with a full glass of water on an empty stomach.) 12 tablet 2   allopurinol (ZYLOPRIM) 100 MG tablet Take 1 tablet (100 mg total) by mouth daily. 90 tablet 1   apixaban (ELIQUIS) 5 MG TABS tablet Take 1 tablet (5 mg total) by mouth 2 (two) times daily. 60 tablet 0   azelastine (ASTELIN) 0.1 % nasal spray Place 2 sprays into both nostrils at bedtime. Use in each nostril as directed (Patient taking differently: Place 2 sprays into both nostrils daily as needed for allergies or rhinitis. Use in each nostril as directed) 30 mL 12   Cranberry  125 MG TABS Take 125 mg by mouth daily.     cyanocobalamin (VITAMIN B12) 1000 MCG tablet Take 1,000  mcg by mouth daily. Monday to Friday     fluticasone (FLONASE) 50 MCG/ACT nasal spray Place 2 sprays into both nostrils daily as needed for allergies or rhinitis.     gabapentin (NEURONTIN) 800 MG tablet TAKE 1 TABLET FOUR TIMES DAILY 360 tablet 3   hydrALAZINE (APRESOLINE) 25 MG tablet TAKE 1 TABLET TWICE DAILY 180 tablet 3   isosorbide mononitrate (IMDUR) 60 MG 24 hr tablet TAKE 1 TABLET EVERY DAY (Patient taking differently: Take 60 mg by mouth at bedtime.) 90 tablet 3   levocetirizine (XYZAL) 5 MG tablet Take 1 tablet (5 mg total) by mouth every evening. 90 tablet 1   losartan (COZAAR) 25 MG tablet TAKE 1 TABLET EVERY DAY 90 tablet 3   meclizine (ANTIVERT) 12.5 MG tablet Take 1 tablet (12.5 mg total) by mouth 3 (three) times daily as needed for dizziness. 30 tablet 0   metoprolol succinate (TOPROL-XL) 25 MG 24 hr tablet TAKE 1 TABLET EVERY DAY (Patient taking differently: Take 25 mg by mouth at bedtime.) 90 tablet 3   pantoprazole (PROTONIX) 40 MG tablet TAKE 1 TABLET EVERY DAY 90 tablet 3   Polyethyl Glyc-Propyl Glyc PF (SYSTANE HYDRATION PF) 0.4-0.3 % SOLN Apply 1 drop to eye daily as needed (dry eyes).     rosuvastatin (CRESTOR) 40 MG tablet TAKE 1 TABLET EVERY DAY (Patient taking differently: Take 40 mg by mouth at bedtime.) 90 tablet 3   senna-docusate (SENOKOT-S) 8.6-50 MG tablet Take 1 tablet by mouth at bedtime as needed for mild constipation. 90 tablet 0   sertraline (ZOLOFT) 25 MG tablet Take 1 tablet (25 mg total) by mouth 3 (three) times daily. 90 tablet 1   Vitamin D, Ergocalciferol, 50000 units CAPS Take 1 capsule by mouth once a week. 12 capsule 3   EPINEPHrine 0.3 mg/0.3 mL IJ SOAJ injection Inject 0.3 mg into the muscle as needed. (Patient not taking: Reported on 11/16/2023)     nitroGLYCERIN (NITROSTAT) 0.4 MG SL tablet Place 0.4 mg under the tongue every 5 (five) minutes as needed for chest pain. (Patient not taking: Reported on 11/14/2023)     No current facility-administered  medications for this visit.     PHYSICAL EXAMINATION: ECOG PERFORMANCE STATUS: 1 - Symptomatic but completely ambulatory Vitals:   11/16/23 1043  BP: (!) 142/68  Pulse: (!) 57  Resp: 18  Temp: 98.2 F (36.8 C)   Filed Weights   11/16/23 1043  Weight: 190 lb 1.6 oz (86.2 kg)    Physical Exam Constitutional:      General: She is not in acute distress.    Comments: Patient ambulates independently  HENT:     Head: Normocephalic and atraumatic.  Eyes:     General: No scleral icterus. Cardiovascular:     Rate and Rhythm: Normal rate and regular rhythm.     Heart sounds: Normal heart sounds.  Pulmonary:     Effort: Pulmonary effort is normal. No respiratory distress.     Breath sounds: No wheezing.  Abdominal:     General: Bowel sounds are normal. There is no distension.     Palpations: Abdomen is soft.  Musculoskeletal:        General: No deformity. Normal range of motion.     Cervical back: Normal range of motion and neck supple.  Skin:    General: Skin is warm and dry.  Findings: No erythema or rash.  Neurological:     Mental Status: She is alert and oriented to person, place, and time. Mental status is at baseline.     Cranial Nerves: No cranial nerve deficit.     Coordination: Coordination normal.  Psychiatric:        Mood and Affect: Mood normal.     LABORATORY DATA:  I have reviewed the data as listed    Latest Ref Rng & Units 11/08/2023   10:05 AM 09/26/2023   10:07 AM 09/02/2023    5:04 AM  CBC  WBC 4.0 - 10.5 K/uL 4.4  5.7  6.3   Hemoglobin 12.0 - 15.0 g/dL 16.1  09.6  04.5   Hematocrit 36.0 - 46.0 % 35.8  33.6  31.4   Platelets 150 - 400 K/uL 186  198  212       Latest Ref Rng & Units 09/26/2023   10:07 AM 09/23/2023   10:07 AM 09/02/2023    5:04 AM  CMP  Glucose 70 - 99 mg/dL 409  85  95   BUN 8 - 23 mg/dL 15  13  20    Creatinine 0.44 - 1.00 mg/dL 8.11  9.14  7.82   Sodium 135 - 145 mmol/L 142  143  138   Potassium 3.5 - 5.1 mmol/L 3.7   4.4  4.0   Chloride 98 - 111 mmol/L 108  106  104   CO2 22 - 32 mmol/L 29  24  26    Calcium 8.9 - 10.3 mg/dL 9.2  9.2  9.2   Total Protein 6.5 - 8.1 g/dL 6.5  5.6    Total Bilirubin <1.2 mg/dL 0.6  0.4    Alkaline Phos 38 - 126 U/L 64  81    AST 15 - 41 U/L 21  17    ALT 0 - 44 U/L 14  10       Iron/TIBC/Ferritin/ %Sat    Component Value Date/Time   IRON 60 11/08/2023 1005   TIBC 328 11/08/2023 1005   FERRITIN 18 11/08/2023 1005   IRONPCTSAT 18 11/08/2023 1005      RADIOGRAPHIC STUDIES: I have personally reviewed the radiological images as listed and agreed with the findings in the report. US ARTERIAL ABI (SCREENING LOWER EXTREMITY) Result Date: 08/31/2023 CLINICAL DATA:  Left leg wound and pain status post recent fall and injury. Hypertension. EXAM: NONINVASIVE PHYSIOLOGIC VASCULAR STUDY OF BILATERAL LOWER EXTREMITIES TECHNIQUE: Evaluation of both lower extremities were performed at rest, including calculation of ankle-brachial indices with single level pressure measurements and doppler recording. COMPARISON:  None available. FINDINGS: Right ABI:  1.36 Left ABI:  1.22 Right Lower Extremity:  Normal arterial waveforms at the ankle. Left Lower Extremity:  Normal arterial waveforms at the ankle. 1.0-1.4 Normal IMPRESSION: Normal ankle-brachial indices. Electronically Signed   By: Acquanetta Belling M.D.   On: 08/31/2023 15:25   CT Cervical Spine Wo Contrast Result Date: 08/24/2023 CLINICAL DATA:  Fall with trauma to the head and neck EXAM: CT CERVICAL SPINE WITHOUT CONTRAST TECHNIQUE: Multidetector CT imaging of the cervical spine was performed without intravenous contrast. Multiplanar CT image reconstructions were also generated. RADIATION DOSE REDUCTION: This exam was performed according to the departmental dose-optimization program which includes automated exposure control, adjustment of the mA and/or kV according to patient size and/or use of iterative reconstruction technique. COMPARISON:   04/28/2022 FINDINGS: Alignment: No traumatic malalignment. Chronic degenerative straightening and kyphosis in the cervical region. Skull base and vertebrae:  No evidence of regional fracture or destructive bone lesion. Soft tissues and spinal canal: No traumatic soft tissue finding. Atherosclerotic calcification at the carotid bifurcations. Disc levels: The foramen magnum is widely patent. There is ordinary mild osteoarthritis of the C1-2 articulation but no encroachment upon the neural structures. C2-3: Negative C3-4 through C6-7: Disc degeneration with endplate osteophytes. No compressive central canal stenosis. Bony foraminal narrowing at C4-5 and C5-6 as seen previously. C7-T1: Minimal noncompressive degenerative changes. Upper chest: Apical pleural and parenchymal scarring. Other: None IMPRESSION: No acute or traumatic finding. Chronic degenerative changes as outlined above. Electronically Signed   By: Paulina Fusi M.D.   On: 08/24/2023 12:03   CT HEAD WO CONTRAST ( ) Result Date: 08/24/2023 CLINICAL DATA:  Fall with trauma to the head and neck EXAM: CT HEAD WITHOUT CONTRAST TECHNIQUE: Contiguous axial images were obtained from the base of the skull through the vertex without intravenous contrast. RADIATION DOSE REDUCTION: This exam was performed according to the departmental dose-optimization program which includes automated exposure control, adjustment of the mA and/or kV according to patient size and/or use of iterative reconstruction technique. COMPARISON:  04/28/2022 FINDINGS: Brain: Mild age related volume loss. No evidence of acute infarction. Old small vessel infarction of the right basal ganglia/internal capsule region. No mass, hemorrhage, hydrocephalus or extra-axial collection. Vascular: There is atherosclerotic calcification of the major vessels at the base of the brain. Skull: Negative Sinuses/Orbits: Clear/normal Other: None IMPRESSION: No acute or traumatic finding. Old small vessel  infarction of the right basal ganglia/internal capsule region. Electronically Signed   By: Paulina Fusi M.D.   On: 08/24/2023 12:00   DG Foot Complete Right Result Date: 08/24/2023 CLINICAL DATA:  Fall.  Pain. EXAM: RIGHT FOOT COMPLETE - 3+ VIEW COMPARISON:  Right foot radiographs dated January 25, 2019. FINDINGS: There is no evidence of acute fracture or dislocation. Joint space narrowing of the first MTP and second through fifth interphalangeal joints. Joint space narrowing, marginal osteophytosis, and subchondral sclerosis of the second through fifth TMT joints. The TMT joints are anatomically aligned. Calcaneal enthesopathy at the insertion of the Achilles tendon and origin of the central cord of the plantar fascia. Mild dorsal soft tissue swelling. IMPRESSION: 1. No acute osseous abnormality. 2. Moderate midfoot arthritic changes. Electronically Signed   By: Hart Robinsons M.D.   On: 08/24/2023 10:40   DG Tibia/Fibula Right Result Date: 08/24/2023 CLINICAL DATA:  Fall.  Pain. EXAM: RIGHT TIBIA AND FIBULA - 2 VIEW COMPARISON:  None Available. FINDINGS: No acute osseous abnormality. The visualized knee and ankle joints appear anatomically aligned. No significant focal soft tissue swelling. Vascular calcifications are present. IMPRESSION: No acute osseous abnormality. Electronically Signed   By: Hart Robinsons M.D.   On: 08/24/2023 10:36   DG Foot Complete Left Result Date: 08/24/2023 CLINICAL DATA:  Fall.  Pain. EXAM: LEFT FOOT - COMPLETE 3+ VIEW COMPARISON:  Left foot radiographs dated January 25, 2019. FINDINGS: No acute fracture or dislocation. Chronic fracture deformity of the second metatarsal. Chronic appearing flattening and sclerosis of the third metatarsal head, which may represent avascular necrosis. Hallux valgus deformity. Advanced degenerative changes the first MTP joint with joint space narrowing and marginal osteophytosis. Diffuse interphalangeal joint space narrowing. The TMT joints  appear anatomically aligned progression of advanced degenerative changes. No significant focal soft tissue swelling. IMPRESSION: 1. No acute osseous abnormality. 2. Chronic appearing flattening and sclerosis of third metatarsal head may reflect avascular necrosis. 3. Chronic fracture deformity of the second metatarsal. 4. Progression  of advanced arthritic changes throughout the midfoot, which can be seen in the setting neuropathic arthropathy. 5. Osteoarthritis, most pronounced the first MTP joint. Electronically Signed   By: Hart Robinsons M.D.   On: 08/24/2023 10:35   DG Tibia/Fibula Left Result Date: 08/24/2023 CLINICAL DATA:  Fall.  Pain. EXAM: LEFT TIBIA AND FIBULA - 2 VIEW COMPARISON:  None Available. FINDINGS: No acute fracture. The visualized knee and ankle joints appear anatomically aligned. Soft tissue swelling of the mid to distal lower extremity. Vascular calcifications are present. IMPRESSION: No acute osseous abnormality. Electronically Signed   By: Hart Robinsons M.D.   On: 08/24/2023 10:29   DG Hip Unilat W or Wo Pelvis 2-3 Views Left Result Date: 08/24/2023 CLINICAL DATA:  Fall. EXAM: DG HIP (WITH OR WITHOUT PELVIS) 2-3V LEFT COMPARISON:  Left hip radiographs dated Feb 16, 2022 FINDINGS: Status post left total hip arthroplasty. The acetabular and femoral components appear well seated. No acute fracture. The right femoral head is seated within the acetabulum. The sacroiliac joints and pubic symphysis are anatomically aligned with degenerative changes. IMPRESSION: No acute osseous abnormality.  Intact left total hip arthroplasty. Electronically Signed   By: Hart Robinsons M.D.   On: 08/24/2023 10:25   DG Elbow Complete Right Result Date: 08/24/2023 CLINICAL DATA:  Status post fall with right arm pain EXAM: RIGHT ELBOW - COMPLETE 4 VIEW COMPARISON:  None Available. FINDINGS: Partially imaged postsurgical changes of the right humerus. Intramedullary rod appears intact. No abnormal  surrounding lucency. There is no evidence of fracture, dislocation, or joint effusion. There is no evidence of arthropathy or other focal bone abnormality. Soft tissues are unremarkable. IMPRESSION: 1. No acute fracture or dislocation. 2. Partially imaged postsurgical changes of the right humerus. Electronically Signed   By: Agustin Cree M.D.   On: 08/24/2023 10:24   DG Shoulder Right Result Date: 08/24/2023 CLINICAL DATA:  Fall.  Pain. EXAM: RIGHT SHOULDER - 2+ VIEW COMPARISON:  Right shoulder radiographs dated June 30, 2022. FINDINGS: Prior ORIF of the right humerus with evidence of interval marginal callus formation. Alignment is relatively unchanged with similar angulation and displaced fracture fragments. Hardware appears intact. No acute osseous abnormality. The glenohumeral joint is anatomically aligned. The acromioclavicular joint is anatomically aligned with mild degenerative changes. IMPRESSION: 1. No acute fracture or dislocation of the right shoulder. 2. Prior ORIF of the right proximal humerus with interval healing and relatively unchanged alignment. Hardware appears intact. Electronically Signed   By: Hart Robinsons M.D.   On: 08/24/2023 10:14

## 2023-11-16 NOTE — Assessment & Plan Note (Addendum)
Iron level is not consistent with iron deficiency. Normal folate.  Continue  empiric B12 daily. Hb has improved.  No M protein, mildly elevated kappa/lamda light chain ratio, non specific.  Close monitor

## 2023-11-16 NOTE — Assessment & Plan Note (Signed)
Follow-up with GI

## 2023-11-16 NOTE — Assessment & Plan Note (Signed)
#  Hereditary hemochromatosis, homozygous HD63 mutation.  Labs reviewed and discussed with patient. Lab Results  Component Value Date   HGB 11.2 (L) 11/08/2023   TIBC 328 11/08/2023   IRONPCTSAT 18 11/08/2023   FERRITIN 18 11/08/2023     No signs of iron over load, no need for phlebotomy

## 2023-11-23 ENCOUNTER — Other Ambulatory Visit: Payer: Self-pay | Admitting: Internal Medicine

## 2023-11-29 ENCOUNTER — Telehealth: Payer: Self-pay

## 2023-11-29 NOTE — Telephone Encounter (Signed)
 Patient called asking for lasix to be called back In for her to [facility]

## 2023-11-30 ENCOUNTER — Other Ambulatory Visit: Payer: Self-pay | Admitting: Cardiology

## 2023-11-30 NOTE — Progress Notes (Signed)
 Opened in error

## 2023-12-03 ENCOUNTER — Encounter: Payer: Self-pay | Admitting: Cardiovascular Disease

## 2023-12-08 ENCOUNTER — Other Ambulatory Visit: Payer: Self-pay | Admitting: Cardiology

## 2023-12-08 DIAGNOSIS — J301 Allergic rhinitis due to pollen: Secondary | ICD-10-CM | POA: Diagnosis not present

## 2023-12-08 DIAGNOSIS — E669 Obesity, unspecified: Secondary | ICD-10-CM | POA: Diagnosis not present

## 2023-12-08 MED ORDER — FUROSEMIDE 20 MG PO TABS: 20.0000 mg | ORAL_TABLET | Freq: Two times a day (BID) | ORAL | 0 refills | Status: DC

## 2023-12-12 ENCOUNTER — Other Ambulatory Visit: Payer: Self-pay | Admitting: Internal Medicine

## 2023-12-12 ENCOUNTER — Encounter: Payer: Self-pay | Admitting: Internal Medicine

## 2023-12-12 DIAGNOSIS — Z792 Long term (current) use of antibiotics: Secondary | ICD-10-CM

## 2023-12-12 MED ORDER — AMOXICILLIN 500 MG PO CAPS: ORAL_CAPSULE | ORAL | 0 refills | Status: DC

## 2023-12-22 ENCOUNTER — Other Ambulatory Visit: Payer: Self-pay | Admitting: Internal Medicine

## 2023-12-22 ENCOUNTER — Other Ambulatory Visit

## 2023-12-22 DIAGNOSIS — R7301 Impaired fasting glucose: Secondary | ICD-10-CM | POA: Diagnosis not present

## 2023-12-22 DIAGNOSIS — E782 Mixed hyperlipidemia: Secondary | ICD-10-CM | POA: Diagnosis not present

## 2023-12-22 DIAGNOSIS — I10 Essential (primary) hypertension: Secondary | ICD-10-CM

## 2023-12-22 DIAGNOSIS — I82531 Chronic embolism and thrombosis of right popliteal vein: Secondary | ICD-10-CM | POA: Diagnosis not present

## 2023-12-23 LAB — LIPID PANEL
Chol/HDL Ratio: 2.3 ratio (ref 0.0–4.4)
Cholesterol, Total: 131 mg/dL (ref 100–199)
HDL: 58 mg/dL (ref >39)
LDL Chol Calc (NIH): 53 mg/dL (ref 0–99)
Triglycerides: 112 mg/dL (ref 0–149)
VLDL Cholesterol Cal: 20 mg/dL (ref 5–40)

## 2023-12-23 LAB — PROTIME-INR
INR: 1.1 (ref 0.9–1.2)
Prothrombin Time: 12.2 s — ABNORMAL HIGH (ref 9.1–12.0)

## 2023-12-23 LAB — HEMOGLOBIN A1C
Est. average glucose Bld gHb Est-mCnc: 114 mg/dL
Hgb A1c MFr Bld: 5.6 % (ref 4.8–5.6)

## 2023-12-23 LAB — CMP14+EGFR
ALT: 8 IU/L (ref 0–32)
AST: 15 IU/L (ref 0–40)
Albumin: 4 g/dL (ref 3.7–4.7)
Alkaline Phosphatase: 66 IU/L (ref 44–121)
BUN/Creatinine Ratio: 18 (ref 12–28)
BUN: 15 mg/dL (ref 8–27)
Bilirubin Total: 0.5 mg/dL (ref 0.0–1.2)
CO2: 28 mmol/L (ref 20–29)
Calcium: 9.5 mg/dL (ref 8.7–10.3)
Chloride: 106 mmol/L (ref 96–106)
Creatinine, Ser: 0.85 mg/dL (ref 0.57–1.00)
Globulin, Total: 1.8 g/dL (ref 1.5–4.5)
Glucose: 92 mg/dL (ref 70–99)
Potassium: 4 mmol/L (ref 3.5–5.2)
Sodium: 144 mmol/L (ref 134–144)
Total Protein: 5.8 g/dL — ABNORMAL LOW (ref 6.0–8.5)
eGFR: 68 mL/min/{1.73_m2} (ref >59)

## 2023-12-23 LAB — TSH: TSH: 0.827 u[IU]/mL (ref 0.450–4.500)

## 2023-12-26 ENCOUNTER — Ambulatory Visit: Payer: Medicare PPO | Admitting: Internal Medicine

## 2023-12-26 ENCOUNTER — Other Ambulatory Visit: Payer: Self-pay | Admitting: Internal Medicine

## 2023-12-26 ENCOUNTER — Ambulatory Visit (INDEPENDENT_AMBULATORY_CARE_PROVIDER_SITE_OTHER): Admitting: Internal Medicine

## 2023-12-26 ENCOUNTER — Encounter: Payer: Self-pay | Admitting: Internal Medicine

## 2023-12-26 VITALS — BP 148/76 | HR 61 | Ht 71.0 in | Wt 193.2 lb

## 2023-12-26 DIAGNOSIS — E790 Hyperuricemia without signs of inflammatory arthritis and tophaceous disease: Secondary | ICD-10-CM | POA: Diagnosis not present

## 2023-12-26 DIAGNOSIS — I82531 Chronic embolism and thrombosis of right popliteal vein: Secondary | ICD-10-CM | POA: Diagnosis not present

## 2023-12-26 DIAGNOSIS — K219 Gastro-esophageal reflux disease without esophagitis: Secondary | ICD-10-CM

## 2023-12-26 DIAGNOSIS — I10 Essential (primary) hypertension: Secondary | ICD-10-CM

## 2023-12-26 DIAGNOSIS — E782 Mixed hyperlipidemia: Secondary | ICD-10-CM | POA: Diagnosis not present

## 2023-12-26 NOTE — Progress Notes (Signed)
 Established Patient Office Visit  Subjective:  Patient ID: Sarah Riddle, female    DOB: Aug 11, 1941  Age: 83 y.o. MRN: [id number]  Chief Complaint  Patient presents with   Follow-up    6 week follow up    Patient comes in for her follow-up today, she is accompanied by her daughter.  Recently had a dental extraction, and still feels some discomfort.  Otherwise no complaints of chest pain or shortness of breath.  Her labs were done earlier and the results discussed today.  Her mammogram is not due until July.  She is tolerating all her medications.    No other concerns at this time.   Past Medical History:  Diagnosis Date   A-fib [facility])    Accelerated hypertension 10/31/2022   Age related osteoporosis    Cancer (HCC)    Melanoma x 2 -- Lt Leg -1976 Rt Arm - 2014   Chronic bilateral low back pain    Chronic kidney disease    Complication of anesthesia    DVT (deep venous thrombosis) (HCC) 1960   right leg  was on BC pills   Dysrhythmia    Family history of breast cancer    Family history of colon cancer    Family history of melanoma    Family history of prostate cancer    Fibrocystic breast disease    GERD (gastroesophageal reflux disease)    Headache    migraine   Heart murmur    not heard now.   History of kidney stones    Hyperlipidemia    Hypertension    IBS (irritable bowel syndrome)    Lumbago    Mitral valve prolapse    Neuropathy    Obstructive sleep apnea    Osteoarthritis of shoulder    PONV (postoperative nausea and vomiting)     Past Surgical History:  Procedure Laterality Date   ABDOMINAL HYSTERECTOMY     BACK SURGERY     BREAST BIOPSY Left 04/20/2023   [place] bx/ heart clip/ path pending   BREAST BIOPSY Left 04/20/2023   [place] LT BREAST BX W LOC DEV 1ST LESION IMG BX SPEC US GUIDE 04/20/2023 [facility]   BREAST CYST ASPIRATION Left 2001   Negative   CARDIAC ELECTROPHYSIOLOGY MAPPING AND ABLATION     EYE SURGERY     HUMERUS IM NAIL Right  05/05/2022   Procedure: INTRAMEDULLARY (IM) NAIL HUMERAL;  Surgeon: Roby Lofts, MD;  Location: [facility] OR;  Service: Orthopedics;  Laterality: Right;   HYSTEROTOMY     L4-L5  2020   titaum rebok cage   LEFT HEART CATH AND CORONARY ANGIOGRAPHY N/A 11/01/2022   Procedure: LEFT HEART CATH AND CORONARY ANGIOGRAPHY and possible PCI and stent;  Surgeon: Laurier Nancy, MD;  Location: [facility] INVASIVE CV LAB;  Service: Cardiovascular;  Laterality: N/A;   TOTAL HIP ARTHROPLASTY Left     Social History   Socioeconomic History   Marital status: Married    Spouse name: Not on file   Number of children: Not on file   Years of education: Not on file   Highest education level: Not on file  Occupational History   Not on file  Tobacco Use   Smoking status: Never   Smokeless tobacco: Never  Vaping Use   Vaping status: Never Used  Substance and Sexual Activity   Alcohol use: No   Drug use: No   Sexual activity: Not Currently  Other Topics Concern   Not on  file  Social History Narrative   Not on file   Social Drivers of Health   Financial Resource Strain: Low Risk  (09/23/2023)   Overall Financial Resource Strain (CARDIA)    Difficulty of Paying Living Expenses: Not hard at all  Food Insecurity: No Food Insecurity (09/12/2023)   Hunger Vital Sign    Worried About Running Out of Food in the Last Year: Never true    Ran Out of Food in the Last Year: Never true  Transportation Needs: No Transportation Needs (09/12/2023)   PRAPARE - Administrator, Civil Service (Medical): No    Lack of Transportation (Non-Medical): No  Physical Activity: Inactive (09/23/2023)   Exercise Vital Sign    Days of Exercise per Week: 0 days    Minutes of Exercise per Session: 0 min  Stress: No Stress Concern Present (09/23/2023)   [employer] of Occupational Health - Occupational Stress Questionnaire    Feeling of Stress : Only a little  Social Connections: Not on file  Intimate Partner  Violence: Not At Risk (09/12/2023)   Humiliation, Afraid, Rape, and Kick questionnaire    Fear of Current or Ex-Partner: No    Emotionally Abused: No    Physically Abused: No    Sexually Abused: No    Family History  Problem Relation Age of Onset   Colon cancer Mother 28   Prostate cancer Father        dx 37s   Cancer Sister    Acute myelogenous leukemia Sister        dx 66s   Prostate cancer Brother    Melanoma Brother    Prostate cancer Brother    Melanoma Brother    HIV/AIDS Brother 82   Tuberculosis Paternal Aunt    Melanoma Other    Breast cancer Other    Melanoma Niece     Allergies  Allergen Reactions   Iodinated Contrast Media Shortness Of Breath and Other (See Comments)    Can be premedicated  Have been able to tolerate if given premeds (benadryl, prednisone)   Ibuprofen Other (See Comments)    "stomach problems"    Sulfa Antibiotics Other (See Comments)    "Stomach problems"  "Stomach problems"  "Stomach problems"  "Stomach problems"   Doxycycline Diarrhea and Nausea And Vomiting   Tetracyclines & Related Nausea Only and Nausea And Vomiting    Outpatient Medications Prior to Visit  Medication Sig   acetaminophen (TYLENOL) 500 MG tablet Take 500 mg by mouth every 6 (six) hours as needed. Once a day   alendronate (FOSAMAX) 70 MG tablet Take 1 tablet (70 mg total) by mouth every Thursday. Take with a full glass of water on an empty stomach. (Patient taking differently: Take 70 mg by mouth every Tuesday. Take with a full glass of water on an empty stomach.)   allopurinol (ZYLOPRIM) 100 MG tablet Take 1 tablet (100 mg total) by mouth daily.   apixaban (ELIQUIS) 5 MG TABS tablet Take 1 tablet (5 mg total) by mouth 2 (two) times daily.   azelastine (ASTELIN) 0.1 % nasal spray Place 2 sprays into both nostrils at bedtime. Use in each nostril as directed (Patient taking differently: Place 2 sprays into both nostrils daily as needed for allergies or rhinitis. Use in  each nostril as directed)   cyanocobalamin (VITAMIN B12) 1000 MCG tablet Take 1,000 mcg by mouth daily. Monday to Friday   fluticasone (FLONASE) 50 MCG/ACT nasal spray Place 2 sprays into both  nostrils daily as needed for allergies or rhinitis.   furosemide (LASIX) 20 MG tablet Take 1 tablet (20 mg total) by mouth 2 (two) times daily.   gabapentin (NEURONTIN) 800 MG tablet TAKE 1 TABLET FOUR TIMES DAILY   hydrALAZINE (APRESOLINE) 25 MG tablet TAKE 1 TABLET TWICE DAILY   isosorbide mononitrate (IMDUR) 60 MG 24 hr tablet TAKE 1 TABLET EVERY DAY (Patient taking differently: Take 60 mg by mouth at bedtime.)   levocetirizine (XYZAL) 5 MG tablet Take 1 tablet (5 mg total) by mouth every evening.   losartan (COZAAR) 25 MG tablet TAKE 1 TABLET EVERY DAY   meclizine (ANTIVERT) 12.5 MG tablet Take 1 tablet (12.5 mg total) by mouth 3 (three) times daily as needed for dizziness.   metoprolol succinate (TOPROL-XL) 25 MG 24 hr tablet TAKE 1 TABLET EVERY DAY (Patient taking differently: Take 25 mg by mouth at bedtime.)   pantoprazole (PROTONIX) 40 MG tablet TAKE 1 TABLET EVERY DAY   Polyethyl Glyc-Propyl Glyc PF (SYSTANE HYDRATION PF) 0.4-0.3 % SOLN Apply 1 drop to eye daily as needed (dry eyes).   rosuvastatin (CRESTOR) 40 MG tablet TAKE 1 TABLET EVERY DAY (Patient taking differently: Take 40 mg by mouth at bedtime.)   senna-docusate (SENOKOT-S) 8.6-50 MG tablet Take 1 tablet by mouth at bedtime as needed for mild constipation.   sertraline (ZOLOFT) 25 MG tablet TAKE 1 TABLET THREE TIMES DAILY   Vitamin D, Ergocalciferol, 50000 units CAPS Take 1 capsule by mouth once a week.   EPINEPHrine 0.3 mg/0.3 mL IJ SOAJ injection Inject 0.3 mg into the muscle as needed. (Patient not taking: Reported on 12/26/2023)   nitroGLYCERIN (NITROSTAT) 0.4 MG SL tablet Place 0.4 mg under the tongue every 5 (five) minutes as needed for chest pain. (Patient not taking: Reported on 12/26/2023)   [DISCONTINUED] amoxicillin (AMOXIL) 500  MG capsule Take 4 caps 1 hour prior to procedure. (Patient not taking: Reported on 12/26/2023)   [DISCONTINUED] Cranberry 125 MG TABS Take 125 mg by mouth daily.   No facility-administered medications prior to visit.    Review of Systems  Constitutional: Negative.  Negative for chills, fever, malaise/fatigue and weight loss.  HENT: Negative.  Negative for sore throat.   Eyes: Negative.   Respiratory: Negative.  Negative for cough and shortness of breath.   Cardiovascular: Negative.  Negative for chest pain, palpitations and leg swelling.  Gastrointestinal: Negative.  Negative for abdominal pain, blood in stool, constipation, diarrhea, heartburn, melena, nausea and vomiting.  Genitourinary: Negative.  Negative for dysuria and flank pain.  Musculoskeletal: Negative.  Negative for joint pain and myalgias.  Neurological: Negative.  Negative for dizziness and headaches.  Endo/Heme/Allergies: Negative.   Psychiatric/Behavioral: Negative.  Negative for depression and suicidal ideas. The patient is not nervous/anxious.        Objective:   BP (!) 148/76   Pulse 61   Ht 5\' 11"  (1.803 m)   Wt 193 lb 3.2 oz (87.6 kg)   SpO2 95%   BMI 26.95 kg/m   Vitals:   12/26/23 1522  BP: (!) 148/76  Pulse: 61  Height: 5\' 11"  (1.803 m)  Weight: 193 lb 3.2 oz (87.6 kg)  SpO2: 95%  BMI (Calculated): 26.96    Physical Exam Vitals and nursing note reviewed.  Constitutional:      Appearance: Normal appearance.  HENT:     Head: Normocephalic and atraumatic.     Nose: Nose normal.     Mouth/Throat:     Mouth: Mucous  membranes are moist.     Pharynx: Oropharynx is clear.  Eyes:     Conjunctiva/sclera: Conjunctivae normal.     Pupils: Pupils are equal, round, and reactive to light.  Cardiovascular:     Rate and Rhythm: Normal rate and regular rhythm.     Pulses: Normal pulses.     Heart sounds: Normal heart sounds. No murmur heard. Pulmonary:     Effort: Pulmonary effort is normal.      Breath sounds: Normal breath sounds. No wheezing.  Abdominal:     General: Bowel sounds are normal.     Palpations: Abdomen is soft.     Tenderness: There is no abdominal tenderness. There is no right CVA tenderness or left CVA tenderness.  Musculoskeletal:        General: Normal range of motion.     Cervical back: Normal range of motion.     Right lower leg: No edema.     Left lower leg: No edema.  Skin:    General: Skin is warm and dry.  Neurological:     General: No focal deficit present.     Mental Status: She is alert and oriented to person, place, and time.  Psychiatric:        Mood and Affect: Mood normal.        Behavior: Behavior normal.      No results found for any visits on 12/26/23.  Recent Results (from the past 2160 hours)  Protime-INR     Status: Abnormal   Collection Time: 09/30/23  8:49 AM  Result Value Ref Range   INR 3.0 (H) 0.9 - 1.2    Comment: Reference interval is for non-anticoagulated patients. Suggested INR therapeutic range for Vitamin K antagonist therapy:    Standard Dose (moderate intensity                   therapeutic range):       2.0 - 3.0    Higher intensity therapeutic range       2.5 - 3.5    Prothrombin Time 30.8 (H) 9.1 - 12.0 sec  Lactate dehydrogenase     Status: None   Collection Time: 11/08/23 10:05 AM  Result Value Ref Range   LDH 134 98 - 192 U/L    Comment: Performed at [facility], [address] [place], [place] [address]  Vitamin B12     Status: None   Collection Time: 11/08/23 10:05 AM  Result Value Ref Range   Vitamin B-12 318 180 - 914 pg/mL    Comment: (NOTE) This assay is not validated for testing neonatal or myeloproliferative syndrome specimens for Vitamin B12 levels. Performed at [facility], [address], [place], [place] [address]   Ferritin     Status: None   Collection Time: 11/08/23 10:05 AM  Result Value Ref Range   Ferritin 18 11 - 307 ng/mL    Comment: Performed at [facility], [address] [place], [place] [address]  Iron and TIBC     Status: None   Collection Time: 11/08/23 10:05 AM  Result Value Ref Range   Iron 60 28 - 170 ug/dL   TIBC 696 295 - 284 ug/dL   Saturation Ratios 18 10.4 - 31.8 %   UIBC 268 ug/dL    Comment: Performed at [facility], [address] [place], [place] [address]  Kappa/lambda light chains     Status: Abnormal   Collection Time:  11/08/23 10:05 AM  Result Value Ref Range   Kappa free light chain 32.9 (H) 3.3 - 19.4 mg/L   Lambda free light chains 19.0 5.7 - 26.3 mg/L   Kappa, lambda light chain ratio 1.73 (H) 0.26 - 1.65    Comment: (NOTE) Performed At: [facility] [address] [place], [place] [id number] Jolene Schimke MD WU:[id number]   CBC with Differential/Platelet     Status: Abnormal   Collection Time: 11/08/23 10:05 AM  Result Value Ref Range   WBC 4.4 4.0 - 10.5 K/uL   RBC 3.88 3.87 - 5.11 MIL/uL   Hemoglobin 11.2 (L) 12.0 - 15.0 g/dL   HCT 95.6 (L) 21.3 - 08.6 %   MCV 92.3 80.0 - 100.0 fL   MCH 28.9 26.0 - 34.0 pg   MCHC 31.3 30.0 - 36.0 g/dL   RDW 57.8 46.9 - 62.9 %   Platelets 186 150 - 400 K/uL   nRBC 0.0 0.0 - 0.2 %   Neutrophils Relative % 55 %   Neutro Abs 2.4 1.7 - 7.7 K/uL   Lymphocytes Relative 34 %   Lymphs Abs 1.5 0.7 - 4.0 K/uL   Monocytes Relative 6 %   Monocytes Absolute 0.3 0.1 - 1.0 K/uL   Eosinophils Relative 4 %   Eosinophils Absolute 0.2 0.0 - 0.5 K/uL   Basophils Relative 1 %   Basophils Absolute 0.0 0.0 - 0.1 K/uL   Immature Granulocytes 0 %   Abs Immature Granulocytes 0.01 0.00 - 0.07 K/uL    Comment: Performed at [facility], [address] [place], [place] [address]  Retic Panel     Status: Abnormal   Collection Time: 11/08/23 10:06 AM  Result Value Ref Range   Retic Ct Pct 0.8 0.4 - 3.1 %   RBC. 3.80 (L) 3.87 - 5.11 MIL/uL   Retic Count, Absolute 31.5 19.0 - 186.0 K/uL   Immature Retic Fract 4.9 2.3 - 15.9 %   Reticulocyte  Hemoglobin 30.4 >27.9 pg    Comment: Performed at [facility], [address] [place], [place] [address]  Multiple Myeloma Panel (SPEP&IFE w/QIG)     Status: Abnormal   Collection Time: 11/08/23 10:06 AM  Result Value Ref Range   IgG (Immunoglobin G), Serum 751 586 - 1,602 mg/dL   IgA 102 64 - 725 mg/dL   IgM (Immunoglobulin M), Srm 86 26 - 217 mg/dL   Total Protein ELP 5.9 (L) 6.0 - 8.5 g/dL   Albumin SerPl Elph-Mcnc 3.5 2.9 - 4.4 g/dL   Alpha 1 0.2 0.0 - 0.4 g/dL   Alpha2 Glob SerPl Elph-Mcnc 0.6 0.4 - 1.0 g/dL   B-Globulin SerPl Elph-Mcnc 0.8 0.7 - 1.3 g/dL   Gamma Glob SerPl Elph-Mcnc 0.7 0.4 - 1.8 g/dL   M Protein SerPl Elph-Mcnc Not Observed Not Observed g/dL   Globulin, Total 2.4 2.2 - 3.9 g/dL   Albumin/Glob SerPl 1.5 0.7 - 1.7   IFE 1 Comment     Comment: (NOTE) The immunofixation pattern appears unremarkable. Evidence of monoclonal protein is not apparent.    Please Note Comment     Comment: (NOTE) Protein electrophoresis scan will follow via computer, mail, or courier delivery. Performed At: [facility] [address] [place], [place] [id number] Jolene Schimke MD QQ:[id number]   Protime-INR     Status: Abnormal   Collection Time: 12/22/23 10:48 AM  Result Value Ref Range   INR 1.1 0.9 - 1.2    Comment: Reference interval is  for non-anticoagulated patients. Suggested INR therapeutic range for Vitamin K antagonist therapy:    Standard Dose (moderate intensity                   therapeutic range):       2.0 - 3.0    Higher intensity therapeutic range       2.5 - 3.5    Prothrombin Time 12.2 (H) 9.1 - 12.0 sec  Hemoglobin A1c     Status: None   Collection Time: 12/22/23 10:49 AM  Result Value Ref Range   Hgb A1c MFr Bld 5.6 4.8 - 5.6 %    Comment:          Prediabetes: 5.7 - 6.4          Diabetes: >6.4          Glycemic control for adults with diabetes: <7.0    Est. average glucose Bld gHb Est-mCnc 114 mg/dL  TSH     Status: None    Collection Time: 12/22/23 10:49 AM  Result Value Ref Range   TSH 0.827 0.450 - 4.500 uIU/mL  CMP14+EGFR     Status: Abnormal   Collection Time: 12/22/23 10:49 AM  Result Value Ref Range   Glucose 92 70 - 99 mg/dL   BUN 15 8 - 27 mg/dL   Creatinine, Ser 8.29 0.57 - 1.00 mg/dL   eGFR 68 >56 OZ/HYQ/6.57   BUN/Creatinine Ratio 18 12 - 28   Sodium 144 134 - 144 mmol/L   Potassium 4.0 3.5 - 5.2 mmol/L   Chloride 106 96 - 106 mmol/L   CO2 28 20 - 29 mmol/L   Calcium 9.5 8.7 - 10.3 mg/dL   Total Protein 5.8 (L) 6.0 - 8.5 g/dL   Albumin 4.0 3.7 - 4.7 g/dL   Globulin, Total 1.8 1.5 - 4.5 g/dL   Bilirubin Total 0.5 0.0 - 1.2 mg/dL   Alkaline Phosphatase 66 44 - 121 IU/L   AST 15 0 - 40 IU/L   ALT 8 0 - 32 IU/L  Lipid panel     Status: None   Collection Time: 12/22/23 10:49 AM  Result Value Ref Range   Cholesterol, Total 131 100 - 199 mg/dL   Triglycerides 846 0 - 149 mg/dL   HDL 58 >96 mg/dL   VLDL Cholesterol Cal 20 5 - 40 mg/dL   LDL Chol Calc (NIH) 53 0 - 99 mg/dL   Chol/HDL Ratio 2.3 0.0 - 4.4 ratio    Comment:                                   T. Chol/HDL Ratio                                             Men  Women                               1/2 Avg.Risk  3.4    3.3                                   Avg.Risk  5.0    4.4  2X Avg.Risk  9.6    7.1                                3X Avg.Risk 23.4   11.0       Assessment & Plan:  Patient advised to continue taking all her medications.  She also needs to continue wearing her compression stockings. Problem List Items Addressed This Visit     GERD (gastroesophageal reflux disease)   Essential hypertension, benign - Primary   HLD (hyperlipidemia)   Other Visit Diagnoses       Chronic deep vein thrombosis (DVT) of popliteal vein of right lower extremity (HCC)         Hyperuricemia           Return in about 4 months (around 04/26/2024).   Total time spent: 30 minutes  Margaretann Loveless,  MD  12/26/2023   This document may have been prepared by [facility] Voice Recognition software and as such may include unintentional dictation errors.

## 2023-12-27 DIAGNOSIS — Z85820 Personal history of malignant melanoma of skin: Secondary | ICD-10-CM | POA: Diagnosis not present

## 2023-12-27 DIAGNOSIS — C44729 Squamous cell carcinoma of skin of left lower limb, including hip: Secondary | ICD-10-CM | POA: Diagnosis not present

## 2023-12-27 DIAGNOSIS — D2272 Melanocytic nevi of left lower limb, including hip: Secondary | ICD-10-CM | POA: Diagnosis not present

## 2023-12-27 DIAGNOSIS — D2271 Melanocytic nevi of right lower limb, including hip: Secondary | ICD-10-CM | POA: Diagnosis not present

## 2023-12-27 DIAGNOSIS — D2262 Melanocytic nevi of left upper limb, including shoulder: Secondary | ICD-10-CM | POA: Diagnosis not present

## 2023-12-27 DIAGNOSIS — D2261 Melanocytic nevi of right upper limb, including shoulder: Secondary | ICD-10-CM | POA: Diagnosis not present

## 2023-12-27 DIAGNOSIS — L570 Actinic keratosis: Secondary | ICD-10-CM | POA: Diagnosis not present

## 2023-12-27 DIAGNOSIS — C44519 Basal cell carcinoma of skin of other part of trunk: Secondary | ICD-10-CM | POA: Diagnosis not present

## 2023-12-27 DIAGNOSIS — C44629 Squamous cell carcinoma of skin of left upper limb, including shoulder: Secondary | ICD-10-CM | POA: Diagnosis not present

## 2023-12-27 DIAGNOSIS — D485 Neoplasm of uncertain behavior of skin: Secondary | ICD-10-CM | POA: Diagnosis not present

## 2023-12-27 DIAGNOSIS — R208 Other disturbances of skin sensation: Secondary | ICD-10-CM | POA: Diagnosis not present

## 2023-12-27 DIAGNOSIS — Z85828 Personal history of other malignant neoplasm of skin: Secondary | ICD-10-CM | POA: Diagnosis not present

## 2023-12-27 DIAGNOSIS — L820 Inflamed seborrheic keratosis: Secondary | ICD-10-CM | POA: Diagnosis not present

## 2024-01-04 ENCOUNTER — Other Ambulatory Visit: Payer: Self-pay | Admitting: Cardiovascular Disease

## 2024-01-18 ENCOUNTER — Other Ambulatory Visit: Payer: Self-pay | Admitting: Cardiology

## 2024-01-25 ENCOUNTER — Emergency Department

## 2024-01-25 ENCOUNTER — Inpatient Hospital Stay
Admission: EM | Admit: 2024-01-25 | Discharge: 2024-01-27 | DRG: 871 | Disposition: A | Discharge status: Home / Self Care | Attending: Student in an Organized Health Care Education/Training Program | Admitting: Student in an Organized Health Care Education/Training Program

## 2024-01-25 ENCOUNTER — Other Ambulatory Visit: Payer: Self-pay

## 2024-01-25 ENCOUNTER — Inpatient Hospital Stay
Admit: 2024-01-25 | Discharge: 2024-01-25 | Disposition: A | Discharge status: Home / Self Care | Attending: Student | Admitting: Student

## 2024-01-25 DIAGNOSIS — I4892 Unspecified atrial flutter: Secondary | ICD-10-CM | POA: Diagnosis not present

## 2024-01-25 DIAGNOSIS — R918 Other nonspecific abnormal finding of lung field: Secondary | ICD-10-CM | POA: Diagnosis not present

## 2024-01-25 DIAGNOSIS — Z8042 Family history of malignant neoplasm of prostate: Secondary | ICD-10-CM

## 2024-01-25 DIAGNOSIS — J189 Pneumonia, unspecified organism: Secondary | ICD-10-CM | POA: Diagnosis not present

## 2024-01-25 DIAGNOSIS — Z888 Allergy status to other drugs, medicaments and biological substances status: Secondary | ICD-10-CM | POA: Diagnosis not present

## 2024-01-25 DIAGNOSIS — M19019 Primary osteoarthritis, unspecified shoulder: Secondary | ICD-10-CM | POA: Diagnosis present

## 2024-01-25 DIAGNOSIS — J811 Chronic pulmonary edema: Secondary | ICD-10-CM | POA: Diagnosis not present

## 2024-01-25 DIAGNOSIS — Z1152 Encounter for screening for COVID-19: Secondary | ICD-10-CM | POA: Diagnosis not present

## 2024-01-25 DIAGNOSIS — A419 Sepsis, unspecified organism: Principal | ICD-10-CM | POA: Diagnosis present

## 2024-01-25 DIAGNOSIS — R7989 Other specified abnormal findings of blood chemistry: Secondary | ICD-10-CM

## 2024-01-25 DIAGNOSIS — Z90710 Acquired absence of both cervix and uterus: Secondary | ICD-10-CM

## 2024-01-25 DIAGNOSIS — I10 Essential (primary) hypertension: Secondary | ICD-10-CM | POA: Diagnosis not present

## 2024-01-25 DIAGNOSIS — Z806 Family history of leukemia: Secondary | ICD-10-CM

## 2024-01-25 DIAGNOSIS — G4733 Obstructive sleep apnea (adult) (pediatric): Secondary | ICD-10-CM | POA: Diagnosis present

## 2024-01-25 DIAGNOSIS — R509 Fever, unspecified: Secondary | ICD-10-CM

## 2024-01-25 DIAGNOSIS — Z79899 Other long term (current) drug therapy: Secondary | ICD-10-CM

## 2024-01-25 DIAGNOSIS — M810 Age-related osteoporosis without current pathological fracture: Secondary | ICD-10-CM | POA: Diagnosis present

## 2024-01-25 DIAGNOSIS — Z882 Allergy status to sulfonamides status: Secondary | ICD-10-CM

## 2024-01-25 DIAGNOSIS — Z7901 Long term (current) use of anticoagulants: Secondary | ICD-10-CM | POA: Diagnosis not present

## 2024-01-25 DIAGNOSIS — Z7982 Long term (current) use of aspirin: Secondary | ICD-10-CM

## 2024-01-25 DIAGNOSIS — A403 Sepsis due to Streptococcus pneumoniae: Principal | ICD-10-CM | POA: Diagnosis present

## 2024-01-25 DIAGNOSIS — I214 Non-ST elevation (NSTEMI) myocardial infarction: Secondary | ICD-10-CM | POA: Diagnosis not present

## 2024-01-25 DIAGNOSIS — E785 Hyperlipidemia, unspecified: Secondary | ICD-10-CM | POA: Diagnosis present

## 2024-01-25 DIAGNOSIS — Z91041 Radiographic dye allergy status: Secondary | ICD-10-CM | POA: Diagnosis not present

## 2024-01-25 DIAGNOSIS — K219 Gastro-esophageal reflux disease without esophagitis: Secondary | ICD-10-CM | POA: Diagnosis present

## 2024-01-25 DIAGNOSIS — I2510 Atherosclerotic heart disease of native coronary artery without angina pectoris: Secondary | ICD-10-CM | POA: Diagnosis present

## 2024-01-25 DIAGNOSIS — R609 Edema, unspecified: Secondary | ICD-10-CM | POA: Diagnosis present

## 2024-01-25 DIAGNOSIS — R0602 Shortness of breath: Secondary | ICD-10-CM | POA: Diagnosis not present

## 2024-01-25 DIAGNOSIS — I2119 ST elevation (STEMI) myocardial infarction involving other coronary artery of inferior wall: Secondary | ICD-10-CM | POA: Diagnosis not present

## 2024-01-25 DIAGNOSIS — Z7983 Long term (current) use of bisphosphonates: Secondary | ICD-10-CM

## 2024-01-25 DIAGNOSIS — Z830 Family history of human immunodeficiency virus [HIV] disease: Secondary | ICD-10-CM

## 2024-01-25 DIAGNOSIS — Z800 Family history of malignant neoplasm of digestive organs: Secondary | ICD-10-CM

## 2024-01-25 DIAGNOSIS — I341 Nonrheumatic mitral (valve) prolapse: Secondary | ICD-10-CM | POA: Diagnosis present

## 2024-01-25 DIAGNOSIS — R0902 Hypoxemia: Secondary | ICD-10-CM

## 2024-01-25 DIAGNOSIS — R000 Tachycardia, unspecified: Secondary | ICD-10-CM | POA: Diagnosis not present

## 2024-01-25 DIAGNOSIS — G8929 Other chronic pain: Secondary | ICD-10-CM | POA: Diagnosis present

## 2024-01-25 DIAGNOSIS — Z86718 Personal history of other venous thrombosis and embolism: Secondary | ICD-10-CM

## 2024-01-25 DIAGNOSIS — R0689 Other abnormalities of breathing: Secondary | ICD-10-CM | POA: Diagnosis not present

## 2024-01-25 DIAGNOSIS — Z803 Family history of malignant neoplasm of breast: Secondary | ICD-10-CM

## 2024-01-25 DIAGNOSIS — J9600 Acute respiratory failure, unspecified whether with hypoxia or hypercapnia: Secondary | ICD-10-CM | POA: Diagnosis not present

## 2024-01-25 DIAGNOSIS — J9601 Acute respiratory failure with hypoxia: Secondary | ICD-10-CM | POA: Diagnosis present

## 2024-01-25 DIAGNOSIS — Z85820 Personal history of malignant melanoma of skin: Secondary | ICD-10-CM

## 2024-01-25 DIAGNOSIS — I480 Paroxysmal atrial fibrillation: Secondary | ICD-10-CM | POA: Diagnosis not present

## 2024-01-25 DIAGNOSIS — Z886 Allergy status to analgesic agent status: Secondary | ICD-10-CM

## 2024-01-25 DIAGNOSIS — R0989 Other specified symptoms and signs involving the circulatory and respiratory systems: Secondary | ICD-10-CM | POA: Diagnosis not present

## 2024-01-25 DIAGNOSIS — Z87442 Personal history of urinary calculi: Secondary | ICD-10-CM

## 2024-01-25 DIAGNOSIS — R6520 Severe sepsis without septic shock: Secondary | ICD-10-CM | POA: Diagnosis not present

## 2024-01-25 DIAGNOSIS — Z808 Family history of malignant neoplasm of other organs or systems: Secondary | ICD-10-CM

## 2024-01-25 LAB — BLOOD CULTURE ID PANEL (REFLEXED) - BCID2

## 2024-01-25 LAB — CBC WITH DIFFERENTIAL/PLATELET
Abs Immature Granulocytes: 0.04 10*3/uL (ref 0.00–0.07)
Basophils Absolute: 0 10*3/uL (ref 0.0–0.1)
Basophils Relative: 0 %
Eosinophils Absolute: 0.1 10*3/uL (ref 0.0–0.5)
Eosinophils Relative: 1 %
HCT: 35.5 % — ABNORMAL LOW (ref 36.0–46.0)
Hemoglobin: 11.3 g/dL — ABNORMAL LOW (ref 12.0–15.0)
Immature Granulocytes: 0 %
Lymphocytes Relative: 18 %
Lymphs Abs: 1.9 10*3/uL (ref 0.7–4.0)
MCH: 29 pg (ref 26.0–34.0)
MCHC: 31.8 g/dL (ref 30.0–36.0)
MCV: 91 fL (ref 80.0–100.0)
Monocytes Absolute: 0.4 10*3/uL (ref 0.1–1.0)
Monocytes Relative: 4 %
Neutro Abs: 8.5 10*3/uL — ABNORMAL HIGH (ref 1.7–7.7)
Neutrophils Relative %: 77 %
Platelets: 166 10*3/uL (ref 150–400)
RBC: 3.9 MIL/uL (ref 3.87–5.11)
RDW: 13.4 % (ref 11.5–15.5)
WBC: 10.9 10*3/uL — ABNORMAL HIGH (ref 4.0–10.5)
nRBC: 0 % (ref 0.0–0.2)

## 2024-01-25 LAB — URINALYSIS, W/ REFLEX TO CULTURE (INFECTION SUSPECTED)
Bacteria, UA: NONE SEEN
Bilirubin Urine: NEGATIVE
Glucose, UA: NEGATIVE mg/dL
Hgb urine dipstick: NEGATIVE
Ketones, ur: NEGATIVE mg/dL
Leukocytes,Ua: NEGATIVE
Nitrite: NEGATIVE
Protein, ur: 30 mg/dL — AB
Specific Gravity, Urine: 1.017 (ref 1.005–1.030)
pH: 6 (ref 5.0–8.0)

## 2024-01-25 LAB — TROPONIN I (HIGH SENSITIVITY)
Troponin I (High Sensitivity): 27 ng/L — ABNORMAL HIGH (ref <18)
Troponin I (High Sensitivity): 433 ng/L (ref <18)

## 2024-01-25 LAB — COMPREHENSIVE METABOLIC PANEL WITH GFR
ALT: 11 U/L (ref 0–44)
AST: 21 U/L (ref 15–41)
Albumin: 3.7 g/dL (ref 3.5–5.0)
Alkaline Phosphatase: 60 U/L (ref 38–126)
Anion gap: 8 (ref 5–15)
BUN: 19 mg/dL (ref 8–23)
CO2: 27 mmol/L (ref 22–32)
Calcium: 9.2 mg/dL (ref 8.9–10.3)
Chloride: 101 mmol/L (ref 98–111)
Creatinine, Ser: 0.9 mg/dL (ref 0.44–1.00)
GFR, Estimated: >60 mL/min (ref >60)
Glucose, Bld: 169 mg/dL — ABNORMAL HIGH (ref 70–99)
Potassium: 3.6 mmol/L (ref 3.5–5.1)
Sodium: 136 mmol/L (ref 135–145)
Total Bilirubin: 0.6 mg/dL (ref 0.0–1.2)
Total Protein: 6.3 g/dL — ABNORMAL LOW (ref 6.5–8.1)

## 2024-01-25 LAB — RESP PANEL BY RT-PCR (RSV, FLU A&B, COVID)  RVPGX2
Influenza A by PCR: NEGATIVE
Influenza B by PCR: NEGATIVE
Resp Syncytial Virus by PCR: NEGATIVE
SARS Coronavirus 2 by RT PCR: NEGATIVE

## 2024-01-25 LAB — CBG MONITORING, ED
Glucose-Capillary: 102 mg/dL — ABNORMAL HIGH (ref 70–99)
Glucose-Capillary: 108 mg/dL — ABNORMAL HIGH (ref 70–99)
Glucose-Capillary: 96 mg/dL (ref 70–99)
Glucose-Capillary: 77 mg/dL (ref 70–99)

## 2024-01-25 LAB — HEPARIN LEVEL (UNFRACTIONATED): Heparin Unfractionated: >1.1 [IU]/mL — ABNORMAL HIGH (ref 0.30–0.70)

## 2024-01-25 LAB — PROTIME-INR
INR: 1.3 — ABNORMAL HIGH (ref 0.8–1.2)
Prothrombin Time: 16.2 s — ABNORMAL HIGH (ref 11.4–15.2)

## 2024-01-25 LAB — APTT
aPTT: 35 s (ref 24–36)
aPTT: >200 s (ref 24–36)

## 2024-01-25 LAB — LACTIC ACID, PLASMA
Lactic Acid, Venous: 1.6 mmol/L (ref 0.5–1.9)
Lactic Acid, Venous: 1.3 mmol/L (ref 0.5–1.9)

## 2024-01-25 LAB — BRAIN NATRIURETIC PEPTIDE: B Natriuretic Peptide: 522.6 pg/mL — ABNORMAL HIGH (ref 0.0–100.0)

## 2024-01-25 MED ORDER — LOSARTAN POTASSIUM 50 MG PO TABS
25.0000 mg | ORAL_TABLET | Freq: Every day | ORAL | Status: DC
  Filled 2024-01-25: qty 1

## 2024-01-25 MED ORDER — SODIUM CHLORIDE 0.9 % IV SOLN
2.0000 g | INTRAVENOUS | Status: DC
  Administered 2024-01-26 – 2024-01-27 (×2): 2 g via INTRAVENOUS
  Filled 2024-01-25 (×2): qty 20

## 2024-01-25 MED ORDER — ISOSORBIDE MONONITRATE ER 60 MG PO TB24: 60.0000 mg | ORAL_TABLET | Freq: Every day | ORAL | Status: DC

## 2024-01-25 MED ORDER — INSULIN ASPART 100 UNIT/ML IJ SOLN: 0.0000 [IU] | Freq: Three times a day (TID) | INTRAMUSCULAR | Status: DC

## 2024-01-25 MED ORDER — HEPARIN SODIUM (PORCINE) 5000 UNIT/ML IJ SOLN: 4000.0000 [IU] | Freq: Once | INTRAMUSCULAR | Status: DC

## 2024-01-25 MED ORDER — SODIUM CHLORIDE 0.9 % IV SOLN
500.0000 mg | Freq: Once | INTRAVENOUS | Status: AC
  Administered 2024-01-25: 500 mg via INTRAVENOUS
  Filled 2024-01-25: qty 5

## 2024-01-25 MED ORDER — HEPARIN (PORCINE) 25000 UT/250ML-% IV SOLN
1250.0000 [IU]/h | INTRAVENOUS | Status: DC
  Administered 2024-01-25: 1250 [IU]/h via INTRAVENOUS
  Filled 2024-01-25: qty 250

## 2024-01-25 MED ORDER — HEPARIN (PORCINE) 25000 UT/250ML-% IV SOLN: 14.0000 [IU]/kg/h | INTRAVENOUS | Status: DC

## 2024-01-25 MED ORDER — SODIUM CHLORIDE 0.9 % IV SOLN: 500.0000 mg | INTRAVENOUS | Status: DC

## 2024-01-25 MED ORDER — PANTOPRAZOLE SODIUM 40 MG PO TBEC
40.0000 mg | DELAYED_RELEASE_TABLET | Freq: Every day | ORAL | Status: DC
Start: 2024-01-25 — End: 2024-01-27
  Administered 2024-01-25 – 2024-01-27 (×3): 40 mg via ORAL
  Filled 2024-01-25 (×3): qty 1

## 2024-01-25 MED ORDER — SERTRALINE HCL 50 MG PO TABS
25.0000 mg | ORAL_TABLET | Freq: Three times a day (TID) | ORAL | Status: DC
Start: 2024-01-25 — End: 2024-01-27
  Administered 2024-01-25 – 2024-01-27 (×7): 25 mg via ORAL
  Filled 2024-01-25 (×7): qty 1

## 2024-01-25 MED ORDER — METOPROLOL SUCCINATE ER 25 MG PO TB24
25.0000 mg | ORAL_TABLET | Freq: Every day | ORAL | Status: DC
  Administered 2024-01-25 – 2024-01-26 (×2): 25 mg via ORAL
  Filled 2024-01-25 (×2): qty 1

## 2024-01-25 MED ORDER — ACETAMINOPHEN 325 MG PO TABS
650.0000 mg | ORAL_TABLET | ORAL | Status: DC | PRN
  Administered 2024-01-25 – 2024-01-26 (×2): 650 mg via ORAL
  Filled 2024-01-25 (×3): qty 2

## 2024-01-25 MED ORDER — NITROGLYCERIN 0.4 MG SL SUBL: 0.4000 mg | SUBLINGUAL_TABLET | SUBLINGUAL | Status: DC | PRN

## 2024-01-25 MED ORDER — LACTATED RINGERS IV SOLN
150.0000 mL/h | INTRAVENOUS | Status: DC
  Administered 2024-01-25: 150 mL/h via INTRAVENOUS

## 2024-01-25 MED ORDER — SODIUM CHLORIDE 0.9 % IV BOLUS (SEPSIS): 1000.0000 mL | Freq: Once | INTRAVENOUS | Status: DC

## 2024-01-25 MED ORDER — SODIUM CHLORIDE 0.9 % IV BOLUS (SEPSIS)
1000.0000 mL | Freq: Once | INTRAVENOUS | Status: AC
  Administered 2024-01-25: 1000 mL via INTRAVENOUS

## 2024-01-25 MED ORDER — ASPIRIN 81 MG PO TBEC
81.0000 mg | DELAYED_RELEASE_TABLET | Freq: Every day | ORAL | Status: DC
  Administered 2024-01-26 – 2024-01-27 (×2): 81 mg via ORAL
  Filled 2024-01-25 (×2): qty 1

## 2024-01-25 MED ORDER — SODIUM CHLORIDE 0.9 % IV SOLN
2.0000 g | Freq: Once | INTRAVENOUS | Status: AC
  Administered 2024-01-25: 2 g via INTRAVENOUS
  Filled 2024-01-25: qty 20

## 2024-01-25 MED ORDER — HEPARIN (PORCINE) 25000 UT/250ML-% IV SOLN
1050.0000 [IU]/h | INTRAVENOUS | Status: DC
  Administered 2024-01-25: 1050 [IU]/h via INTRAVENOUS

## 2024-01-25 MED ORDER — ONDANSETRON HCL 4 MG/2ML IJ SOLN: 4.0000 mg | Freq: Four times a day (QID) | INTRAMUSCULAR | Status: DC | PRN

## 2024-01-25 MED ORDER — ROSUVASTATIN CALCIUM 20 MG PO TABS
40.0000 mg | ORAL_TABLET | Freq: Every day | ORAL | Status: DC
  Administered 2024-01-25 – 2024-01-26 (×2): 40 mg via ORAL
  Filled 2024-01-25 (×3): qty 2

## 2024-01-25 MED ORDER — SODIUM CHLORIDE 0.9 % IV BOLUS
1000.0000 mL | Freq: Once | INTRAVENOUS | Status: AC
  Administered 2024-01-25: 1000 mL via INTRAVENOUS

## 2024-01-25 MED ORDER — CETIRIZINE HCL 10 MG PO TABS
10.0000 mg | ORAL_TABLET | Freq: Every evening | ORAL | Status: DC
  Administered 2024-01-25 – 2024-01-26 (×2): 10 mg via ORAL
  Filled 2024-01-25 (×4): qty 1

## 2024-01-25 NOTE — ED Notes (Signed)
 Pt had not urinated today, MD was notified earlier. Repeat bladder scan showed , straight cath performed. removed from bladder at this time

## 2024-01-25 NOTE — H&P (Signed)
 History and Physical    Patient: Sarah Riddle FAO:[id number] DOB: [birth year] DOA: 01/25/2024 DOS: the patient was seen and examined on 01/25/2024 PCP: Aisha Hove, MD  Patient coming from: Home  Chief Complaint:  Chief Complaint  Patient presents with   Shortness of Breath   Cough    HPI: Sarah Riddle is a 83 y.o. female with medical history significant for OSA on CPAP, CAD, PAF s/p ablation 2019 on apixaban , HTN, chronic  pain, history of melanoma being admitted with sepsis secondary to pneumonia and elevated troponin 27-->433. Patient presented to the ED via EMS with a 1 day history of cough and congestion, fever and shortness of breath.  She has left-sided chest pain on coughing.  Denies lower extremity pain or swelling.  On arrival of EMS O2 sats 82% improving to 90% on 6 L.  She was subsequently placed on 15 L NRB ED course and data review: On arrival tachycardic to 107, tachypneic to 23 and febrile at 102.5.  BP 182/74.  O2 sat 92% on 6 L Labs notable for the following: WBC 11,000 with lactic acid 1.3, respiratory viral panel pending.  CMP unremarkable neck Urinalysis unremarkable Troponin 27-> 433 and BNP 522  EKG, personally viewed and interpreted showing a flutter at 113 Chest x-ray with pulmonary vascular congestion with mild interstitial edema and suspected superimposed bilateral upper lobe and bibasilar infiltrates left greater than right-recommendation for CT  Treatment in the ED: NS bolus 1 L x 2 Rocephin  and azithromycin  and heparin  bolus and infusion started for troponin 433  Hospitalist consulted for admission.   Review of Systems: As mentioned in the history of present illness. All other systems reviewed and are negative.  Past Medical History:  Diagnosis Date   A-fib [facility])    Accelerated hypertension 10/31/2022   Age related osteoporosis    Cancer (HCC)    Melanoma x 2 -- Lt Leg -1976 Rt Arm - 2014   Chronic bilateral low back pain    Chronic  kidney disease    Complication of anesthesia    DVT (deep venous thrombosis) (HCC) 1960   right leg  was on BC pills   Dysrhythmia    Family history of breast cancer    Family history of colon cancer    Family history of melanoma    Family history of prostate cancer    Fibrocystic breast disease    GERD (gastroesophageal reflux disease)    Headache    migraine   Heart murmur    not heard now.   History of kidney stones    Hyperlipidemia    Hypertension    IBS (irritable bowel syndrome)    Lumbago    Mitral valve prolapse    Neuropathy    Obstructive sleep apnea    Osteoarthritis of shoulder    PONV (postoperative nausea and vomiting)    Past Surgical History:  Procedure Laterality Date   ABDOMINAL HYSTERECTOMY     BACK SURGERY     BREAST BIOPSY Left 04/20/2023   us  bx/ heart clip/ path pending   BREAST BIOPSY Left 04/20/2023   US  LT BREAST BX W LOC DEV 1ST LESION IMG BX SPEC US  GUIDE 04/20/2023 [facility]   BREAST CYST ASPIRATION Left 2001   Negative   CARDIAC ELECTROPHYSIOLOGY MAPPING AND ABLATION     EYE SURGERY     HUMERUS IM NAIL Right 05/05/2022   Procedure: INTRAMEDULLARY (IM) NAIL HUMERAL;  Surgeon: Laneta Pintos, MD;  Location:  [facility] OR;  Service: Orthopedics;  Laterality: Right;   HYSTEROTOMY     L4-L5  2020   titaum rebok cage   LEFT HEART CATH AND CORONARY ANGIOGRAPHY N/A 11/01/2022   Procedure: LEFT HEART CATH AND CORONARY ANGIOGRAPHY and possible PCI and stent;  Surgeon: Cherrie Cornwall, MD;  Location: [facility] INVASIVE CV LAB;  Service: Cardiovascular;  Laterality: N/A;   TOTAL HIP ARTHROPLASTY Left    Social History:  reports that she has never smoked. She has never used smokeless tobacco. She reports that she does not drink alcohol and does not use drugs.  Allergies  Allergen Reactions   Iodinated Contrast Media Shortness Of Breath and Other (See Comments)    Can be premedicated  Have been able to tolerate if given premeds (benadryl , prednisone )    Ibuprofen Other (See Comments)    "stomach problems"    Sulfa Antibiotics Other (See Comments)    "Stomach problems"  "Stomach problems"  "Stomach problems"  "Stomach problems"   Doxycycline Diarrhea and Nausea And Vomiting   Tetracyclines & Related Nausea Only and Nausea And Vomiting    Family History  Problem Relation Age of Onset   Colon cancer Mother 53   Prostate cancer Father        dx 60s   Cancer Sister    Acute myelogenous leukemia Sister        dx 45s   Prostate cancer Brother    Melanoma Brother    Prostate cancer Brother    Melanoma Brother    HIV/AIDS Brother 39   Tuberculosis Paternal Aunt    Melanoma Other    Breast cancer Other    Melanoma Niece     Prior to Admission medications   Medication Sig Start Date End Date Taking? Authorizing Provider  acetaminophen  (TYLENOL ) 500 MG tablet Take 500 mg by mouth every 6 (six) hours as needed. Once a day    [provider]  alendronate  (FOSAMAX ) 70 MG tablet Take 1 tablet (70 mg total) by mouth every Thursday. Take with a full glass of water on an empty stomach. Patient taking differently: Take 70 mg by mouth every Tuesday. Take with a full glass of water on an empty stomach. 07/14/23   Aisha Hove, MD  allopurinol  (ZYLOPRIM ) 100 MG tablet Take 1 tablet (100 mg total) by mouth daily. 09/30/23   Aisha Hove, MD  apixaban  (ELIQUIS ) 5 MG TABS tablet Take 1 tablet (5 mg total) by mouth 2 (two) times daily. 10/13/23   Aisha Hove, MD  azelastine  (ASTELIN ) 0.1 % nasal spray Place 2 sprays into both nostrils at bedtime. Use in each nostril as directed Patient taking differently: Place 2 sprays into both nostrils daily as needed for allergies or rhinitis. Use in each nostril as directed 07/14/23   Aisha Hove, MD  cyanocobalamin  (VITAMIN B12) 1000 MCG tablet Take 1,000 mcg by mouth daily. Monday to Friday    [provider]  EPINEPHrine  0.3 mg/0.3 mL IJ SOAJ injection Inject 0.3 mg into the muscle  as needed. Patient not taking: Reported on 12/26/2023 06/02/21   [provider]  fluticasone  (FLONASE ) 50 MCG/ACT nasal spray Place 2 sprays into both nostrils daily as needed for allergies or rhinitis. 07/31/17   [provider]  furosemide  (LASIX ) 20 MG tablet Take 1 tablet (20 mg total) by mouth 2 (two) times daily. 12/08/23   Scoggins, Amber, NP  gabapentin  (NEURONTIN ) 800 MG tablet TAKE 1 TABLET FOUR TIMES DAILY 03/17/23  Trenda Frisk, FNP  hydrALAZINE  (APRESOLINE ) 25 MG tablet TAKE 1 TABLET TWICE DAILY 07/25/23   Cherrie Cornwall, MD  isosorbide  mononitrate (IMDUR ) 60 MG 24 hr tablet TAKE 1 TABLET EVERY DAY Patient taking differently: Take 60 mg by mouth at bedtime. 03/08/23   Cherrie Cornwall, MD  levocetirizine (XYZAL ) 5 MG tablet TAKE 1 TABLET EVERY EVENING 12/27/23   Aisha Hove, MD  losartan  (COZAAR ) 25 MG tablet TAKE 1 TABLET EVERY DAY 09/19/23   Cherrie Cornwall, MD  meclizine  (ANTIVERT ) 12.5 MG tablet Take 1 tablet (12.5 mg total) by mouth 3 (three) times daily as needed for dizziness. 01/31/23   Boswell, Chelsa, NP  metoprolol  succinate (TOPROL -XL) 25 MG 24 hr tablet TAKE 1 TABLET EVERY DAY Patient taking differently: Take 25 mg by mouth at bedtime. 03/08/23   Cherrie Cornwall, MD  nitroGLYCERIN  (NITROSTAT ) 0.4 MG SL tablet Place 0.4 mg under the tongue every 5 (five) minutes as needed for chest pain. Patient not taking: Reported on 12/26/2023 01/05/22   [provider]  pantoprazole  (PROTONIX ) 40 MG tablet TAKE 1 TABLET EVERY DAY 01/05/24   Cherrie Cornwall, MD  Polyethyl Glyc-Propyl Glyc PF (SYSTANE HYDRATION PF) 0.4-0.3 % SOLN Apply 1 drop to eye daily as needed (dry eyes).    [provider]  rosuvastatin  (CRESTOR ) 40 MG tablet TAKE 1 TABLET EVERY DAY Patient taking differently: Take 40 mg by mouth at bedtime. 08/16/23   Cherrie Cornwall, MD  senna-docusate (SENOKOT-S) 8.6-50 MG tablet Take 1 tablet by mouth at bedtime as needed for mild constipation.  09/02/23   Ezzard Holms, MD  sertraline  (ZOLOFT ) 25 MG tablet TAKE 1 TABLET THREE TIMES DAILY 01/19/24   Aisha Hove, MD  Vitamin D , Ergocalciferol , 50000 units CAPS Take 1 capsule by mouth once a week. 10/10/23   Aisha Hove, MD    Physical Exam: Vitals:   01/25/24 0200 01/25/24 0230 01/25/24 0300 01/25/24 0320  BP: (!) 119/59 121/60 (!) 117/58   Pulse: 90 88 88   Resp: (!) 26 (!) 21 (!) 25   Temp:    (!) 101 F (38.3 C)  TempSrc:    Oral  SpO2: 100% 100% 100%   Weight:       Physical Exam Vitals and nursing note reviewed.  Constitutional:      General: She is not in acute distress. HENT:     Head: Normocephalic and atraumatic.  Cardiovascular:     Rate and Rhythm: Normal rate and regular rhythm.     Heart sounds: Normal heart sounds.  Pulmonary:     Effort: Tachypnea present.     Breath sounds: Wheezing present.  Abdominal:     Palpations: Abdomen is soft.     Tenderness: There is no abdominal tenderness.  Neurological:     Mental Status: Mental status is at baseline.     Labs on Admission: I have personally reviewed following labs and imaging studies  CBC: Recent Labs  Lab 01/25/24 0112  WBC 10.9*  NEUTROABS 8.5*  HGB 11.3*  HCT 35.5*  MCV 91.0  PLT 166   Basic Metabolic Panel: Recent Labs  Lab 01/25/24 0112  NA 136  K 3.6  CL 101  CO2 27  GLUCOSE 169*  BUN 19  CREATININE 0.90  CALCIUM  9.2   GFR: Estimated Creatinine Clearance: 60 mL/min (by C-G formula based on SCr of 0.9 mg/dL). Liver Function Tests: Recent Labs  Lab 01/25/24 0112  AST 21  ALT 11  ALKPHOS 60  BILITOT 0.6  PROT 6.3*  ALBUMIN 3.7   No results for input(s): "LIPASE", "AMYLASE" in the last 168 hours. No results for input(s): "AMMONIA" in the last 168 hours. Coagulation Profile: No results for input(s): "INR", "PROTIME" in the last 168 hours. Cardiac Enzymes: No results for input(s): "CKTOTAL", "CKMB", "CKMBINDEX", "TROPONINI" in the last 168 hours. BNP (last 3  results) No results for input(s): "PROBNP" in the last 8760 hours. HbA1C: No results for input(s): "HGBA1C" in the last 72 hours. CBG: No results for input(s): "GLUCAP" in the last 168 hours. Lipid Profile: No results for input(s): "CHOL", "HDL", "LDLCALC", "TRIG", "CHOLHDL", "LDLDIRECT" in the last 72 hours. Thyroid  Function Tests: No results for input(s): "TSH", "T4TOTAL", "FREET4", "T3FREE", "THYROIDAB" in the last 72 hours. Anemia Panel: No results for input(s): "VITAMINB12", "FOLATE", "FERRITIN", "TIBC", "IRON", "RETICCTPCT" in the last 72 hours. Urine analysis:    Component Value Date/Time   COLORURINE YELLOW (A) 01/25/2024 0124   APPEARANCEUR CLEAR (A) 01/25/2024 0124   LABSPEC 1.017 01/25/2024 0124   PHURINE 6.0 01/25/2024 0124   GLUCOSEU NEGATIVE 01/25/2024 0124   HGBUR NEGATIVE 01/25/2024 0124   BILIRUBINUR NEGATIVE 01/25/2024 0124   KETONESUR NEGATIVE 01/25/2024 0124   PROTEINUR 30 (A) 01/25/2024 0124   NITRITE NEGATIVE 01/25/2024 0124   LEUKOCYTESUR NEGATIVE 01/25/2024 0124    Radiological Exams on Admission: DG Chest Port 1 View Result Date: 01/25/2024 CLINICAL DATA:  Shortness of breath and cough. EXAM: PORTABLE CHEST 1 VIEW COMPARISON:  January 09, 2023 FINDINGS: The heart size and mediastinal contours are within normal limits. Mild prominence of the central pulmonary vasculature is noted. There is marked severity calcification of the aortic arch. Mild diffusely increased interstitial lung markings are seen. Moderate severity superimposed left upper lobe infiltrate is noted. Mild superimposed right upper lobe and bibasilar infiltrates are also suspected. No pleural effusion or pneumothorax is identified. Postoperative changes are seen involving the right humerus. Multilevel degenerative changes seen throughout the thoracic spine. IMPRESSION: 1. Mild pulmonary vascular congestion with mild interstitial edema. 2. Suspected superimposed bilateral upper lobe and bibasilar  infiltrates, left greater than right. Further evaluation with chest CT is recommended. Electronically Signed   By: Virgle Grime M.D.   On: 01/25/2024 01:38     Data Reviewed: Relevant notes from primary care and specialist visits, past discharge summaries as available in EHR, including Care Everywhere. Prior diagnostic testing as pertinent to current admission diagnoses Updated medications and problem lists for reconciliation ED course, including vitals, labs, imaging, treatment and response to treatment Triage notes, nursing and pharmacy notes and ED provider's notes Notable results as noted in HPI   Assessment and Plan: * Sepsis due to pneumonia (HCC) Acute respiratory failure with hypoxia Sepsis criteria include fever, tachycardia and tachypnea with leukocytosis Patient has shortness of breath and requiring 6 L to maintain sats in the low 90s Rocephin  and azithromycin  Sepsis fluids Antitussives, incentive spirometer continue supplemental oxygen  and wean as tolerated  NSTEMI (non-ST elevated myocardial infarction) (HCC) Coronary artery disease Suspect type II NSTEMI with troponin bump from 27-4 33 Denies chest pain and EKG is nonacute Will continue heparin  infusion given significant delta Continue home losartan , metoprolol , Imdur , nitroglycerin , rosuvastatin , apixaban  Cardiology consult Will get echo to evaluate for segmental wall motion abnormality  Essential hypertension, benign Continue home meds with hold parameters  Chronic pain Continue gabapentin  and Tylenol   PAF with history of ablation (paroxysmal atrial fibrillation) (HCC) Chronic coagulation Continue metoprolol  Currently on heparin  infusion  OSA on CPAP CPAP nightly     DVT prophylaxis: apixaban   Consults: cardiology, Dr Khan/Dr [facility]  Advance Care Planning:   Code Status: Prior   Family Communication: none  Disposition Plan: Back to previous home environment  Severity of Illness: The  appropriate patient status for this patient is INPATIENT. Inpatient status is judged to be reasonable and necessary in order to provide the required intensity of service to ensure the patient's safety. The patient's presenting symptoms, physical exam findings, and initial radiographic and laboratory data in the context of their chronic comorbidities is felt to place them at high risk for further clinical deterioration. Furthermore, it is not anticipated that the patient will be medically stable for discharge from the hospital within 2 midnights of admission.   * I certify that at the point of admission it is my clinical judgment that the patient will require inpatient hospital care spanning beyond 2 midnights from the point of admission due to high intensity of service, high risk for further deterioration and high frequency of surveillance required.*  Author: Lanetta Pion, MD 01/25/2024 4:25 AM  For on call review [email or web address].

## 2024-01-25 NOTE — Progress Notes (Signed)
  INTERVAL PROGRESS NOTE    Sarah Riddle- 83 y.o. female  LOS: 0 __________________________________________________________________  SUBJECTIVE: Admitted 01/25/2024 with cc of  Chief Complaint  Patient presents with   Shortness of Breath   Cough   Since admission, remains stable.   OBJECTIVE: Blood pressure (!) 97/48, pulse 83, temperature 98.3 F (36.8 C), temperature source Oral, resp. rate (!) 21, weight 94.3 kg, SpO2 (!) 5%  ASSESSMENT/PLAN:  I have reviewed the full H&P by Dr. Vallarie Gauze, and I reviewed pertinent findings.   In addition:   Sepsis due to pneumonia (HCC) Acute respiratory failure with hypoxia- weaned down to 3L   NSTEMI (non-ST elevated myocardial infarction) (HCC) Coronary artery disease 48 hr heparin  gtt  Principal Problem:   Sepsis due to pneumonia [facility]) Active Problems:   Acute respiratory failure with hypoxia (HCC)   NSTEMI (non-ST elevated myocardial infarction) (HCC)   OSA on CPAP   PAF with history of ablation (paroxysmal atrial fibrillation) (HCC)   Chronic pain   Chronic anticoagulation (Coumadin )   Essential hypertension, benign   CAD (coronary artery disease)   Ree Candy, DO [facility] 01/25/2024, 8:21 AM    [email or web address] Available by Epic secure chat 7AM-7PM. If 7PM-7AM, please contact night-coverage   No Charge

## 2024-01-25 NOTE — Assessment & Plan Note (Signed)
 Acute respiratory failure with hypoxia Sepsis criteria include fever, tachycardia and tachypnea with leukocytosis Patient has shortness of breath and requiring 6 L to maintain sats in the low 90s Rocephin  and azithromycin  Sepsis fluids Antitussives, incentive spirometer continue supplemental oxygen  and wean as tolerated

## 2024-01-25 NOTE — Progress Notes (Signed)
 PHARMACY - PHYSICIAN COMMUNICATION CRITICAL VALUE ALERT - BLOOD CULTURE IDENTIFICATION (BCID)  Sarah Riddle is an 83 y.o. female who presented to [facility] on 01/25/2024 with a chief complaint of pneumonia.  Assessment:  4/4 bcx growing GPC, BCID = Strep Pneumoniae  Name of physician (or Provider) Contacted: Dr. Alva Jewels  Current antibiotics: Azithromycin  and ceftriaxone   Changes to prescribed antibiotics recommended:  Continue ceftriaxone  2 grams IV every 24 hours and stop azithromycin .  Results for orders placed or performed during the hospital encounter of 01/25/24  Blood Culture ID Panel (Reflexed) (Collected: 01/25/2024  1:15 AM)  Result Value Ref Range   Enterococcus faecalis NOT DETECTED NOT DETECTED   Enterococcus Faecium NOT DETECTED NOT DETECTED   Listeria monocytogenes NOT DETECTED NOT DETECTED   Staphylococcus species NOT DETECTED NOT DETECTED   Staphylococcus aureus (BCID) NOT DETECTED NOT DETECTED   Staphylococcus epidermidis NOT DETECTED NOT DETECTED   Staphylococcus lugdunensis NOT DETECTED NOT DETECTED   Streptococcus species DETECTED (A) NOT DETECTED   Streptococcus agalactiae NOT DETECTED NOT DETECTED   Streptococcus pneumoniae DETECTED (A) NOT DETECTED   Streptococcus pyogenes NOT DETECTED NOT DETECTED   A.calcoaceticus-baumannii NOT DETECTED NOT DETECTED   Bacteroides fragilis NOT DETECTED NOT DETECTED   Enterobacterales NOT DETECTED NOT DETECTED   Enterobacter cloacae complex NOT DETECTED NOT DETECTED   Escherichia coli NOT DETECTED NOT DETECTED   Klebsiella aerogenes NOT DETECTED NOT DETECTED   Klebsiella oxytoca NOT DETECTED NOT DETECTED   Klebsiella pneumoniae NOT DETECTED NOT DETECTED   Proteus species NOT DETECTED NOT DETECTED   Salmonella species NOT DETECTED NOT DETECTED   Serratia marcescens NOT DETECTED NOT DETECTED   Haemophilus influenzae NOT DETECTED NOT DETECTED   Neisseria meningitidis NOT DETECTED NOT DETECTED   Pseudomonas aeruginosa  NOT DETECTED NOT DETECTED   Stenotrophomonas maltophilia NOT DETECTED NOT DETECTED   Candida albicans NOT DETECTED NOT DETECTED   Candida auris NOT DETECTED NOT DETECTED   Candida glabrata NOT DETECTED NOT DETECTED   Candida krusei NOT DETECTED NOT DETECTED   Candida parapsilosis NOT DETECTED NOT DETECTED   Candida tropicalis NOT DETECTED NOT DETECTED   Cryptococcus neoformans/gattii NOT DETECTED NOT DETECTED    Ramonita Burow 01/25/2024  4:35 PM

## 2024-01-25 NOTE — Progress Notes (Signed)
 PHARMACY - ANTICOAGULATION CONSULT NOTE  Pharmacy Consult for Heparin   Indication: chest pain/ACS  Allergies  Allergen Reactions   Iodinated Contrast Media Shortness Of Breath and Other (See Comments)    Can be premedicated  Have been able to tolerate if given premeds (benadryl , prednisone )   Ibuprofen Other (See Comments)    "stomach problems"    Sulfa Antibiotics Other (See Comments)    "Stomach problems"  "Stomach problems"  "Stomach problems"  "Stomach problems"   Doxycycline Diarrhea and Nausea And Vomiting   Tetracyclines & Related Nausea Only and Nausea And Vomiting    Patient Measurements: Weight: 94.3 kg (208 lb)  Vital Signs: Temp: 101 F (38.3 C) (04/23 0320) Temp Source: Oral (04/23 0320) BP: 117/58 (04/23 0300) Pulse Rate: 88 (04/23 0300)  Labs: Recent Labs    01/25/24 0112 01/25/24 0317  HGB 11.3*  --   HCT 35.5*  --   PLT 166  --   CREATININE 0.90  --   TROPONINIHS 27* 433*    Estimated Creatinine Clearance: 60 mL/min (by C-G formula based on SCr of 0.9 mg/dL).   Medical History: Past Medical History:  Diagnosis Date   A-fib [facility])    Accelerated hypertension 10/31/2022   Age related osteoporosis    Cancer (HCC)    Melanoma x 2 -- Lt Leg -1976 Rt Arm - 2014   Chronic bilateral low back pain    Chronic kidney disease    Complication of anesthesia    DVT (deep venous thrombosis) (HCC) 1960   right leg  was on BC pills   Dysrhythmia    Family history of breast cancer    Family history of colon cancer    Family history of melanoma    Family history of prostate cancer    Fibrocystic breast disease    GERD (gastroesophageal reflux disease)    Headache    migraine   Heart murmur    not heard now.   History of kidney stones    Hyperlipidemia    Hypertension    IBS (irritable bowel syndrome)    Lumbago    Mitral valve prolapse    Neuropathy    Obstructive sleep apnea    Osteoarthritis of shoulder    PONV (postoperative nausea and  vomiting)     Medications:  (Not in a hospital admission)   Assessment: Pharmacy consulted to dose heparin  in this 83 year old female admitted with NSTEMI.  Pt was on Eliquis  5 mg PO BID PTA, last dose on 4/22 PM. CrCl = 60 ml/min   Goal of Therapy:  Heparin  level 0.3-0.7 units/ml aPTT 66 - 102 seconds Monitor platelets by anticoagulation protocol: Yes   Plan:  - draw baseline anticoag labs - order heparin  drip to start at 1250 units/hr.  No bolus due to recent Eliquis  - will use aPTT to guide dosing until correlating with HL - will check aPTT 8 hrs after start of drip - will check HL on 4/24 with AM labs - CBC daily   [place] Ganoe D 01/25/2024,4:48 AM

## 2024-01-25 NOTE — ED Notes (Signed)
 This RN updated pts daughter Clarisa Crooked with plan of care per patients permission. All questions answered.

## 2024-01-25 NOTE — Consult Note (Signed)
 [facility] CLINIC CARDIOLOGY CONSULT NOTE       Patient ID: Sarah Riddle MRN: [id number] DOB/AGE: July 12, 1941 83 y.o.  Admit date: 01/25/2024 Referring Physician Dr. Vallarie Gauze Primary Physician Aisha Hove, MD  Primary Cardiologist Dr. Phyllis Breeze Reason for Consultation NSTEMI  HPI: Sarah Riddle is a 83 y.o. female  with a past medical history of nonobstructive coronary artery disease, paroxysmal atrial fibrillation s/p ablation 2019 (on Eliquis ), OSA on CPAP, hypertension, hx of melonoma, chronic pain who presented to the ED on 01/25/2024 for cough, congestion, fever, and SOB. Admitted due to sepsis secondary to pneumonia. During ED work up found to have elevated troponins. Cardiology was consulted for further evaluation.   Patient presented to the ED via EMS from home due to 1 day of cough, congestion, SOB and fever. Patient denies any active chest pain. Per EMS patients O2 desaturated and placed on 15L NRB. Workup in the ED notable for Na 136, K 3.6, Cr 0.90, Hgb 11.3, WBC 10.9. Lactic acid negative x2. Blood cultures drawn and pending. CXR revealed pulmonary vascular congestion with bilateral infiltrates. BNP 522, Trops trending 27 > 433. EKG in ED with Sinus rhythm, rate 113 bpm. Patient started in IV antibiotics and heparin  gtt.   At the time of my evaluation this morning, patient is resting comfortably in ED bed in no acute distress. Discussed patients symptoms in more detail. Patient reports her SOB is improving and denies any chest pain. Patient able to wean off NRB to 6L [place] with stable SpO2. Patients BP has been borderline with stable HR this AM. Per tele patient has remained in sinus rhythm.   Review of systems complete and found to be negative unless listed above    Past Medical History:  Diagnosis Date   A-fib [facility])    Accelerated hypertension 10/31/2022   Age related osteoporosis    Cancer (HCC)    Melanoma x 2 -- Lt Leg -1976 Rt Arm - 2014   Chronic bilateral low back pain     Chronic kidney disease    Complication of anesthesia    DVT (deep venous thrombosis) (HCC) 1960   right leg  was on BC pills   Dysrhythmia    Family history of breast cancer    Family history of colon cancer    Family history of melanoma    Family history of prostate cancer    Fibrocystic breast disease    GERD (gastroesophageal reflux disease)    Headache    migraine   Heart murmur    not heard now.   History of kidney stones    Hyperlipidemia    Hypertension    IBS (irritable bowel syndrome)    Lumbago    Mitral valve prolapse    Neuropathy    Obstructive sleep apnea    Osteoarthritis of shoulder    PONV (postoperative nausea and vomiting)     Past Surgical History:  Procedure Laterality Date   ABDOMINAL HYSTERECTOMY     BACK SURGERY     BREAST BIOPSY Left 04/20/2023   us  bx/ heart clip/ path pending   BREAST BIOPSY Left 04/20/2023   US  LT BREAST BX W LOC DEV 1ST LESION IMG BX SPEC US  GUIDE 04/20/2023 [facility]   BREAST CYST ASPIRATION Left 2001   Negative   CARDIAC ELECTROPHYSIOLOGY MAPPING AND ABLATION     EYE SURGERY     HUMERUS IM NAIL Right 05/05/2022   Procedure: INTRAMEDULLARY (IM) NAIL HUMERAL;  Surgeon: Laneta Pintos,  MD;  Location: [facility] OR;  Service: Orthopedics;  Laterality: Right;   HYSTEROTOMY     L4-L5  2020   titaum rebok cage   LEFT HEART CATH AND CORONARY ANGIOGRAPHY N/A 11/01/2022   Procedure: LEFT HEART CATH AND CORONARY ANGIOGRAPHY and possible PCI and stent;  Surgeon: Cherrie Cornwall, MD;  Location: [facility] INVASIVE CV LAB;  Service: Cardiovascular;  Laterality: N/A;   TOTAL HIP ARTHROPLASTY Left     (Not in a hospital admission)  Social History   Socioeconomic History   Marital status: Married    Spouse name: Not on file   Number of children: Not on file   Years of education: Not on file   Highest education level: Not on file  Occupational History   Not on file  Tobacco Use   Smoking status: Never   Smokeless tobacco: Never   Vaping Use   Vaping status: Never Used  Substance and Sexual Activity   Alcohol use: No   Drug use: No   Sexual activity: Not Currently  Other Topics Concern   Not on file  Social History Narrative   Not on file   Social Drivers of Health   Financial Resource Strain: Low Risk  (09/23/2023)   Overall Financial Resource Strain (CARDIA)    Difficulty of Paying Living Expenses: Not hard at all  Food Insecurity: No Food Insecurity (09/12/2023)   Hunger Vital Sign    Worried About Running Out of Food in the Last Year: Never true    Ran Out of Food in the Last Year: Never true  Transportation Needs: No Transportation Needs (09/12/2023)   PRAPARE - Administrator, Civil Service (Medical): No    Lack of Transportation (Non-Medical): No  Physical Activity: Inactive (09/23/2023)   Exercise Vital Sign    Days of Exercise per Week: 0 days    Minutes of Exercise per Session: 0 min  Stress: No Stress Concern Present (09/23/2023)   [employer] of Occupational Health - Occupational Stress Questionnaire    Feeling of Stress : Only a little  Social Connections: Not on file  Intimate Partner Violence: Not At Risk (09/12/2023)   Humiliation, Afraid, Rape, and Kick questionnaire    Fear of Current or Ex-Partner: No    Emotionally Abused: No    Physically Abused: No    Sexually Abused: No    Family History  Problem Relation Age of Onset   Colon cancer Mother 67   Prostate cancer Father        dx 11s   Cancer Sister    Acute myelogenous leukemia Sister        dx 53s   Prostate cancer Brother    Melanoma Brother    Prostate cancer Brother    Melanoma Brother    HIV/AIDS Brother 41   Tuberculosis Paternal Aunt    Melanoma Other    Breast cancer Other    Melanoma Niece      Vitals:   01/25/24 0500 01/25/24 0600 01/25/24 0632 01/25/24 0726  BP: (!) 99/51 (!) 97/48    Pulse: 89 83    Resp: 20 (!) 21    Temp:    98.3 F (36.8 C)  TempSrc:    Oral  SpO2: 96% 99% (!)  5%   Weight:        PHYSICAL EXAM General: well appearing elderly female, well nourished, in no acute distress. HEENT: Normocephalic and atraumatic. Neck: No JVD.  Lungs: Normal respiratory effort on  5L. Clear bilaterally to auscultation. No wheezes, crackles, rhonchi.  Heart: HRRR. Normal S1 and S2 without gallops or murmurs.  Abdomen: Non-distended appearing.  Msk: Normal strength and tone for age. Extremities: Warm and well perfused. No clubbing, cyanosis. No edema.  Neuro: Alert and oriented X 3. Psych: Answers questions appropriately.   Labs: Basic Metabolic Panel: Recent Labs    01/25/24 0112  NA 136  K 3.6  CL 101  CO2 27  GLUCOSE 169*  BUN 19  CREATININE 0.90  CALCIUM  9.2   Liver Function Tests: Recent Labs    01/25/24 0112  AST 21  ALT 11  ALKPHOS 60  BILITOT 0.6  PROT 6.3*  ALBUMIN 3.7   No results for input(s): "LIPASE", "AMYLASE" in the last 72 hours. CBC: Recent Labs    01/25/24 0112  WBC 10.9*  NEUTROABS 8.5*  HGB 11.3*  HCT 35.5*  MCV 91.0  PLT 166   Cardiac Enzymes: Recent Labs    01/25/24 0112 01/25/24 0317  TROPONINIHS 27* 433*   BNP: Recent Labs    01/25/24 0112  BNP 522.6*   D-Dimer: No results for input(s): "DDIMER" in the last 72 hours. Hemoglobin A1C: No results for input(s): "HGBA1C" in the last 72 hours. Fasting Lipid Panel: No results for input(s): "CHOL", "HDL", "LDLCALC", "TRIG", "CHOLHDL", "LDLDIRECT" in the last 72 hours. Thyroid  Function Tests: No results for input(s): "TSH", "T4TOTAL", "T3FREE", "THYROIDAB" in the last 72 hours.  Invalid input(s): "FREET3" Anemia Panel: No results for input(s): "VITAMINB12", "FOLATE", "FERRITIN", "TIBC", "IRON", "RETICCTPCT" in the last 72 hours.   Radiology: [facility] Chest Port 1 View Result Date: 01/25/2024 CLINICAL DATA:  Shortness of breath and cough. EXAM: PORTABLE CHEST 1 VIEW COMPARISON:  January 09, 2023 FINDINGS: The heart size and mediastinal contours are within normal  limits. Mild prominence of the central pulmonary vasculature is noted. There is marked severity calcification of the aortic arch. Mild diffusely increased interstitial lung markings are seen. Moderate severity superimposed left upper lobe infiltrate is noted. Mild superimposed right upper lobe and bibasilar infiltrates are also suspected. No pleural effusion or pneumothorax is identified. Postoperative changes are seen involving the right humerus. Multilevel degenerative changes seen throughout the thoracic spine. IMPRESSION: 1. Mild pulmonary vascular congestion with mild interstitial edema. 2. Suspected superimposed bilateral upper lobe and bibasilar infiltrates, left greater than right. Further evaluation with chest CT is recommended. Electronically Signed   By: Virgle Grime M.D.   On: 01/25/2024 01:38   LHC:  11/01/2022 (Dr. Meredeth Stallion) 1st Mrg lesion is 30% stenosed. LV end diastolic pressure is normal. The left ventricular ejection fraction is 50-55% by visual estimate.  ECHO ordered  TELEMETRY reviewed by me 01/25/2024: sinus rhythm, rate 70s  EKG reviewed by me: Sinus rhythm, rate 113 bpm   Data reviewed by me 01/25/2024: last 24h vitals tele labs imaging I/O ED provider note, admission H&P  Principal Problem:   Sepsis due to pneumonia [facility]) Active Problems:   OSA on CPAP   PAF with history of ablation (paroxysmal atrial fibrillation) (HCC)   Chronic pain   Chronic anticoagulation (Coumadin )   Acute respiratory failure with hypoxia (HCC)   Essential hypertension, benign   NSTEMI (non-ST elevated myocardial infarction) (HCC)   CAD (coronary artery disease)    ASSESSMENT AND PLAN:  Sarah Riddle is a 83 y.o. female  with a past medical history of coronary artery disease, paroxysmal atrial fibrillation s/p ablation 2019 (on Eliquis ), OSA on CPAP, hypertension, hx of melonoma, chronic pain  who presented to the ED on 01/25/2024 for cough, congestion, fever, and SOB. Admitted due to  sepsis secondary to pneumonia. During ED work up found to have elevated troponins. Cardiology was consulted for further evaluation.   # Sepsis, secondary to pneumonia # Acute hypoxic respiratory failure # NSTEMI type 1 vs type 2 # Paroxysmal atrial fibrillation s/p ablation (2019) # Hypertension  Patient presented to the ED due to 1 day of cough, congestion, fever and shortness of breath. Code sepsis called in ED in setting of pneumonia. CXR revealed pulmonary vascular congestion with bilateral infiltrates. BNP elevated 522. Trops trending 27 > 433. Patient had recent LHC in 10/2022 with no significant coronary artery disease. EKG in ED with Sinus rhythm, rate 113 bpm. This AM BP is borderline with stable HR. Per tele patient had remained in sinus rhythm. Patient denies any chest pain.  -Echo ordered -Elevated troponins in the setting of sepsis secondary to pneumonia and acute hypoxic respiratory failure most consistent with demand/supply mismatch and not ACS. -Continue heparin  gtt for 48 hours. When off heparin  will resume home Eliquis . -Continue ASA 81 mg, rosuvastatin  40 mg daily. -Resume home lasix  20 mg twice daily. Closely monitor renal function. -Continue metoprolol  succinate 25 mg daily. -Defer resuming home imdur  as patient BP is borderline and no significant CAD per LHC done in 10/2022. -Continue losartan  25 mg daily.  -Defer resuming home hydralazine  due to borderline BP.  -Sepsis, PNA management per primary.  This patient's plan of care was discussed and created with Dr. Beau Bound and he is in agreement.  Signed: Hamp Levine, PA-C  01/25/2024, 9:00 AM [facility] Cardiology

## 2024-01-25 NOTE — Assessment & Plan Note (Addendum)
 Chronic coagulation Continue metoprolol  Currently on heparin  infusion

## 2024-01-25 NOTE — Assessment & Plan Note (Signed)
 CPAP nightly

## 2024-01-25 NOTE — Assessment & Plan Note (Signed)
 Continue gabapentin  and Tylenol 

## 2024-01-25 NOTE — ED Provider Notes (Signed)
 [facility] Provider Note    Event Date/Time   First MD Initiated Contact with Patient 01/25/24 0100     (approximate)   History   Shortness of Breath and Cough   HPI  Sarah Riddle is a 83 y.o. female brought to the ED via EMS from home with a chief complaint of cough, congestion, shortness of breath symptoms x 1 day.  EMS reports room air saturation 82%, arrives to the ED on nonrebreather oxygen .  Patient vomited and route and was given 4 mg IV Zofran .  EMS administered 1 g IV Tylenol  and route.  Patient endorses productive cough and chest tightness.  Denies abdominal pain, dysuria or diarrhea.     Past Medical History   Past Medical History:  Diagnosis Date   A-fib [facility])    Accelerated hypertension 10/31/2022   Age related osteoporosis    Cancer (HCC)    Melanoma x 2 -- Lt Leg -1976 Rt Arm - 2014   Chronic bilateral low back pain    Chronic kidney disease    Complication of anesthesia    DVT (deep venous thrombosis) (HCC) 1960   right leg  was on BC pills   Dysrhythmia    Family history of breast cancer    Family history of colon cancer    Family history of melanoma    Family history of prostate cancer    Fibrocystic breast disease    GERD (gastroesophageal reflux disease)    Headache    migraine   Heart murmur    not heard now.   History of kidney stones    Hyperlipidemia    Hypertension    IBS (irritable bowel syndrome)    Lumbago    Mitral valve prolapse    Neuropathy    Obstructive sleep apnea    Osteoarthritis of shoulder    PONV (postoperative nausea and vomiting)      Active Problem List   Patient Active Problem List   Diagnosis Date Noted   Sepsis due to pneumonia (HCC) 01/25/2024   Cellulitis of left leg 08/30/2023   Wound cellulitis 08/30/2023   CAD (coronary artery disease) 08/30/2023   Depression 08/30/2023   Dizziness 11/15/2022   NSTEMI (non-ST elevated myocardial infarction) (HCC) 11/15/2022   Elevated  troponin 11/01/2022   Hypertensive urgency 10/31/2022   Hereditary hemochromatosis (HCC) 08/04/2022   Fatty liver disease, nonalcoholic 08/04/2022   Closed fracture of right proximal humerus 05/05/2022   Orthostatic hypotension 02/18/2022   Fall 02/17/2022   Pelvic fracture (HCC) 02/17/2022   Hematoma_left gluteal region 02/17/2022   Acute blood loss anemia 02/17/2022   AKI (acute kidney injury) (HCC) 02/17/2022   Essential hypertension, benign 02/17/2022   HLD (hyperlipidemia) 02/17/2022   Hypokalemia 02/17/2022   Encounter for annual health examination 11/10/2021   Family history of melanoma 10/21/2021   Family history of prostate cancer 10/21/2021   Family history of breast cancer 10/21/2021   Family history of colon cancer 10/21/2021   Bronchospasm    Acute respiratory failure with hypoxia (HCC)    History of melanoma 05/20/2021   Family history of cancer 05/20/2021   Normocytic anemia 05/19/2021   History of allergy to radiographic contrast media 03/20/2020   Abnormal MRI, cervical spine (09/24/2019) 03/10/2020   Radicular pain of shoulder (Left) 03/10/2020   Cervical foraminal stenosis (Bilateral) (C3-4, C4-5, C5-6) 09/10/2019   Cervical grade 1 Anterolisthesis of C7/T1 09/10/2019   Cervical grade 1 Retrolisthesis of C4/C5 09/10/2019  Cervical facet hypertrophy 09/10/2019   Sensory peripheral neuropathy 02/15/2019   Neurogenic pain 02/15/2019   Chronic kidney disease 01/24/2019   Fibrocystic breast disease 01/24/2019   GERD (gastroesophageal reflux disease) 01/24/2019   Gout 01/24/2019   Irritable bowel syndrome 01/24/2019   Migraine headache 01/24/2019   Osteoporosis 01/24/2019   Chronic pain syndrome 01/24/2019   Chronic feet pain (Primary area of Pain) (Bilateral) (L>R) 01/24/2019   Chronic pain 01/24/2019   Cervicalgia (Right) 01/24/2019   Chronic shoulder pain (Fifth Area of Pain) (Left) 01/24/2019   Chronic knee pain (Sixth Area of Pain) (Left) 01/24/2019    Chronic anticoagulation (Coumadin ) 01/24/2019   Disorder of skeletal system 01/24/2019   Pharmacologic therapy 01/24/2019   Problems influencing health status 01/24/2019   DDD (degenerative disc disease), cervical 01/24/2019   DDD (degenerative disc disease), lumbar 01/24/2019   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   Numbness and tingling of feet (Bilateral) 11/28/2018   PAF with history of ablation (paroxysmal atrial fibrillation) (HCC) 03/05/2018   Nontraumatic complete tear of rotator cuff (Left) 01/13/2018   Osteoarthritis of shoulder (Left) 01/13/2018   Rotator cuff tendinitis, left 01/13/2018   Osteoarthritis of knee (Left) 02/21/2017   Age-related osteoporosis without current pathological fracture 01/15/2016   Allergic rhinitis 01/15/2016   Hypertension 01/15/2016   OSA on CPAP 01/15/2016   Pain in both lower extremities 10/29/2015   Chronic tension-type headache, not intractable 07/18/2014   Difficulty walking 07/18/2014   Obesity (BMI 30-39.9) 07/18/2014   Neuropathy 07/18/2014   Chronic pain of lower extremity (Bilateral) 03/13/2014   Cervicogenic headache (Tertiary Area of Pain) (Right) 03/13/2014   Numbness and tingling 03/13/2014   Chronic low back pain (Secondary Area of Pain) (Bilateral) (R>L) without sciatica 01/08/2014   Osteoarthrosis, unspecified whether generalized or localized, pelvic region and thigh 01/08/2014   Melanoma of upper arm (HCC) 02/21/2013     Past Surgical History   Past Surgical History:  Procedure Laterality Date   ABDOMINAL HYSTERECTOMY     BACK SURGERY     BREAST BIOPSY Left 04/20/2023   us  bx/ heart clip/ path pending   BREAST BIOPSY Left 04/20/2023   US  LT BREAST BX W LOC DEV 1ST LESION IMG BX SPEC US  GUIDE 04/20/2023 [facility]   BREAST CYST ASPIRATION Left 2001   Negative   CARDIAC ELECTROPHYSIOLOGY MAPPING AND ABLATION     EYE SURGERY     HUMERUS IM NAIL Right 05/05/2022   Procedure: INTRAMEDULLARY (IM) NAIL HUMERAL;   Surgeon: Laneta Pintos, MD;  Location: [facility] OR;  Service: Orthopedics;  Laterality: Right;   HYSTEROTOMY     L4-L5  2020   titaum rebok cage   LEFT HEART CATH AND CORONARY ANGIOGRAPHY N/A 11/01/2022   Procedure: LEFT HEART CATH AND CORONARY ANGIOGRAPHY and possible PCI and stent;  Surgeon: Cherrie Cornwall, MD;  Location: [facility] INVASIVE CV LAB;  Service: Cardiovascular;  Laterality: N/A;   TOTAL HIP ARTHROPLASTY Left      Home Medications   Prior to Admission medications   Medication Sig Start Date End Date Taking? Authorizing Provider  acetaminophen  (TYLENOL ) 500 MG tablet Take 500 mg by mouth every 6 (six) hours as needed. Once a day    [provider]  alendronate  (FOSAMAX ) 70 MG tablet Take 1 tablet (70 mg total) by mouth every Thursday. Take with a full glass of water on an empty stomach. Patient taking differently: Take 70 mg by mouth every Tuesday. Take with a full glass of water on  an empty stomach. 07/14/23   Aisha Hove, MD  allopurinol  (ZYLOPRIM ) 100 MG tablet Take 1 tablet (100 mg total) by mouth daily. 09/30/23   Aisha Hove, MD  apixaban  (ELIQUIS ) 5 MG TABS tablet Take 1 tablet (5 mg total) by mouth 2 (two) times daily. 10/13/23   Aisha Hove, MD  azelastine  (ASTELIN ) 0.1 % nasal spray Place 2 sprays into both nostrils at bedtime. Use in each nostril as directed Patient taking differently: Place 2 sprays into both nostrils daily as needed for allergies or rhinitis. Use in each nostril as directed 07/14/23   Aisha Hove, MD  cyanocobalamin  (VITAMIN B12) 1000 MCG tablet Take 1,000 mcg by mouth daily. Monday to Friday    [provider]  EPINEPHrine  0.3 mg/0.3 mL IJ SOAJ injection Inject 0.3 mg into the muscle as needed. Patient not taking: Reported on 12/26/2023 06/02/21   [provider]  fluticasone  (FLONASE ) 50 MCG/ACT nasal spray Place 2 sprays into both nostrils daily as needed for allergies or rhinitis. 07/31/17   [provider]   furosemide  (LASIX ) 20 MG tablet Take 1 tablet (20 mg total) by mouth 2 (two) times daily. 12/08/23   Scoggins, Hospital doctor, NP  gabapentin  (NEURONTIN ) 800 MG tablet TAKE 1 TABLET FOUR TIMES DAILY 03/17/23   Trenda Frisk, FNP  hydrALAZINE  (APRESOLINE ) 25 MG tablet TAKE 1 TABLET TWICE DAILY 07/25/23   Cherrie Cornwall, MD  isosorbide  mononitrate (IMDUR ) 60 MG 24 hr tablet TAKE 1 TABLET EVERY DAY Patient taking differently: Take 60 mg by mouth at bedtime. 03/08/23   Cherrie Cornwall, MD  levocetirizine (XYZAL ) 5 MG tablet TAKE 1 TABLET EVERY EVENING 12/27/23   Aisha Hove, MD  losartan  (COZAAR ) 25 MG tablet TAKE 1 TABLET EVERY DAY 09/19/23   Cherrie Cornwall, MD  meclizine  (ANTIVERT ) 12.5 MG tablet Take 1 tablet (12.5 mg total) by mouth 3 (three) times daily as needed for dizziness. 01/31/23   Boswell, Chelsa, NP  metoprolol  succinate (TOPROL -XL) 25 MG 24 hr tablet TAKE 1 TABLET EVERY DAY Patient taking differently: Take 25 mg by mouth at bedtime. 03/08/23   Cherrie Cornwall, MD  nitroGLYCERIN  (NITROSTAT ) 0.4 MG SL tablet Place 0.4 mg under the tongue every 5 (five) minutes as needed for chest pain. Patient not taking: Reported on 12/26/2023 01/05/22   [provider]  pantoprazole  (PROTONIX ) 40 MG tablet TAKE 1 TABLET EVERY DAY 01/05/24   Cherrie Cornwall, MD  Polyethyl Glyc-Propyl Glyc PF (SYSTANE HYDRATION PF) 0.4-0.3 % SOLN Apply 1 drop to eye daily as needed (dry eyes).    [provider]  rosuvastatin  (CRESTOR ) 40 MG tablet TAKE 1 TABLET EVERY DAY Patient taking differently: Take 40 mg by mouth at bedtime. 08/16/23   Cherrie Cornwall, MD  senna-docusate (SENOKOT-S) 8.6-50 MG tablet Take 1 tablet by mouth at bedtime as needed for mild constipation. 09/02/23   Ezzard Holms, MD  sertraline  (ZOLOFT ) 25 MG tablet TAKE 1 TABLET THREE TIMES DAILY 01/19/24   Aisha Hove, MD  Vitamin D , Ergocalciferol , 50000 units CAPS Take 1 capsule by mouth once a week. 10/10/23   Aisha Hove, MD      Allergies  Iodinated contrast media, Ibuprofen, Sulfa antibiotics, Doxycycline, and Tetracyclines & related   Family History   Family History  Problem Relation Age of Onset   Colon cancer Mother 59   Prostate cancer Father        dx 8s   Cancer  Sister    Acute myelogenous leukemia Sister        dx 72s   Prostate cancer Brother    Melanoma Brother    Prostate cancer Brother    Melanoma Brother    HIV/AIDS Brother 27   Tuberculosis Paternal Aunt    Melanoma Other    Breast cancer Other    Melanoma Niece      Physical Exam  Triage Vital Signs: ED Triage Vitals  Encounter Vitals Group     BP      Systolic BP Percentile      Diastolic BP Percentile      Pulse      Resp      Temp      Temp src      SpO2      Weight      Height      Head Circumference      Peak Flow      Pain Score      Pain Loc      Pain Education      Exclude from Growth Chart     Updated Vital Signs: BP (!) 97/48   Pulse 83   Temp (!) 101 F (38.3 C) (Oral)   Resp (!) 21   Wt 94.3 kg   SpO2 (!) 5%   BMI 29.01 kg/m    General: Awake, moderate distress.  CV:  Tachycardic.  Good peripheral perfusion.  Resp:  Increased effort.  Diminished aeration with scattered rhonchi. Abd:  Nontender to light or deep palpation.  No distention.  Other:  Supple neck without meningismus.  No petechiae.   ED Results / Procedures / Treatments  Labs (all labs ordered are listed, but only abnormal results are displayed) Labs Reviewed  CBC WITH DIFFERENTIAL/PLATELET - Abnormal; Notable for the following components:      Result Value   WBC 10.9 (*)    Hemoglobin 11.3 (*)    HCT 35.5 (*)    Neutro Abs 8.5 (*)    All other components within normal limits  COMPREHENSIVE METABOLIC PANEL WITH GFR - Abnormal; Notable for the following components:   Glucose, Bld 169 (*)    Total Protein 6.3 (*)    All other components within normal limits  BRAIN NATRIURETIC PEPTIDE - Abnormal; Notable for the  following components:   B Natriuretic Peptide 522.6 (*)    All other components within normal limits  URINALYSIS, W/ REFLEX TO CULTURE (INFECTION SUSPECTED) - Abnormal; Notable for the following components:   Color, Urine YELLOW (*)    APPearance CLEAR (*)    Protein, ur 30 (*)    All other components within normal limits  PROTIME-INR - Abnormal; Notable for the following components:   Prothrombin  Time 16.2 (*)    INR 1.3 (*)    All other components within normal limits  HEPARIN  LEVEL (UNFRACTIONATED) - Abnormal; Notable for the following components:   Heparin  Unfractionated >1.10 (*)    All other components within normal limits  TROPONIN I (HIGH SENSITIVITY) - Abnormal; Notable for the following components:   Troponin I (High Sensitivity) 27 (*)    All other components within normal limits  TROPONIN I (HIGH SENSITIVITY) - Abnormal; Notable for the following components:   Troponin I (High Sensitivity) 433 (*)    All other components within normal limits  RESP PANEL BY RT-PCR (RSV, FLU A&B, COVID)  [id number]  CULTURE, BLOOD (ROUTINE X 2)  CULTURE, BLOOD (ROUTINE X 2)  LACTIC  ACID, PLASMA  LACTIC ACID, PLASMA  APTT  APTT     EKG  ED ECG REPORT I, Jhene Westmoreland J, the attending physician, personally viewed and interpreted this ECG.   Date: 01/25/2024  EKG Time: 0104  Rate: 113  Rhythm: sinus tachycardia  Axis: Normal  Intervals:none  ST&T Change: Nonspecific    RADIOLOGY I have independently visualized and interpreted patient's imaging study as well as noted the radiology interpretation:  Chest x-ray: Multifocal pneumonia  Official radiology report(s): DG Chest Port 1 View Result Date: 01/25/2024 CLINICAL DATA:  Shortness of breath and cough. EXAM: PORTABLE CHEST 1 VIEW COMPARISON:  January 09, 2023 FINDINGS: The heart size and mediastinal contours are within normal limits. Mild prominence of the central pulmonary vasculature is noted. There is marked severity calcification of  the aortic arch. Mild diffusely increased interstitial lung markings are seen. Moderate severity superimposed left upper lobe infiltrate is noted. Mild superimposed right upper lobe and bibasilar infiltrates are also suspected. No pleural effusion or pneumothorax is identified. Postoperative changes are seen involving the right humerus. Multilevel degenerative changes seen throughout the thoracic spine. IMPRESSION: 1. Mild pulmonary vascular congestion with mild interstitial edema. 2. Suspected superimposed bilateral upper lobe and bibasilar infiltrates, left greater than right. Further evaluation with chest CT is recommended. Electronically Signed   By: Virgle Grime M.D.   On: 01/25/2024 01:38     PROCEDURES:  Critical Care performed: Yes, see critical care procedure note(s)  CRITICAL CARE Performed by: Norlene Beavers   Total critical care time: 45 minutes  Critical care time was exclusive of separately billable procedures and treating other patients.  Critical care was necessary to treat or prevent imminent or life-threatening deterioration.  Critical care was time spent personally by me on the following activities: development of treatment plan with patient and/or surrogate as well as nursing, discussions with consultants, evaluation of patient's response to treatment, examination of patient, obtaining history from patient or surrogate, ordering and performing treatments and interventions, ordering and review of laboratory studies, ordering and review of radiographic studies, pulse oximetry and re-evaluation of patient's condition.   Aaron Aas1-3 Lead EKG Interpretation  Performed by: Norlene Beavers, MD Authorized by: Norlene Beavers, MD     Interpretation: abnormal     ECG rate:  113   ECG rate assessment: tachycardic     Rhythm: sinus tachycardia     Ectopy: none     Conduction: normal   Comments:     Patient placed on cardiac monitor to evaluate for arrhythmias    MEDICATIONS ORDERED  IN ED: Medications  isosorbide  mononitrate (IMDUR ) 24 hr tablet 60 mg (has no administration in time range)  losartan  (COZAAR ) tablet 25 mg (has no administration in time range)  metoprolol  succinate (TOPROL -XL) 24 hr tablet 25 mg (has no administration in time range)  rosuvastatin  (CRESTOR ) tablet 40 mg (has no administration in time range)  sertraline  (ZOLOFT ) tablet 25 mg (has no administration in time range)  pantoprazole  (PROTONIX ) EC tablet 40 mg (has no administration in time range)  cetirizine  (ZYRTEC ) tablet 10 mg (has no administration in time range)  aspirin  EC tablet 81 mg (has no administration in time range)  nitroGLYCERIN  (NITROSTAT ) SL tablet 0.4 mg (has no administration in time range)  acetaminophen  (TYLENOL ) tablet 650 mg (has no administration in time range)  ondansetron  (ZOFRAN ) injection 4 mg (has no administration in time range)  lactated ringers  infusion (150 mL/hr Intravenous New Bag/Given 01/25/24 0528)  insulin  aspart (novoLOG ) injection 0-15  Units (has no administration in time range)  cefTRIAXone  (ROCEPHIN ) 2 g in sodium chloride  0.9 % 100 mL IVPB (has no administration in time range)  azithromycin  (ZITHROMAX ) 500 mg in sodium chloride  0.9 % 250 mL IVPB (has no administration in time range)  heparin  ADULT infusion 100 units/mL (25000 units/250mL) (1,250 Units/hr Intravenous New Bag/Given 01/25/24 0508)  sodium chloride  0.9 % bolus 1,000 mL (0 mLs Intravenous Stopped 01/25/24 0225)  sodium chloride  0.9 % bolus 1,000 mL (0 mLs Intravenous Stopped 01/25/24 0327)    And  sodium chloride  0.9 % bolus 1,000 mL (0 mLs Intravenous Stopped 01/25/24 0436)  cefTRIAXone  (ROCEPHIN ) 2 g in sodium chloride  0.9 % 100 mL IVPB (0 g Intravenous Stopped 01/25/24 0315)  azithromycin  (ZITHROMAX ) 500 mg in sodium chloride  0.9 % 250 mL IVPB (0 mg Intravenous Stopped 01/25/24 0436)     IMPRESSION / MDM / ASSESSMENT AND PLAN / ED COURSE  I reviewed the triage vital signs and the nursing  notes.                             83 year old female who presents with cold-like symptoms, fever, hypoxia and respiratory distress. Differential includes, but is not limited to, viral syndrome, bronchitis including COPD exacerbation, pneumonia, reactive airway disease including asthma, CHF including exacerbation with or without pulmonary/interstitial edema, pneumothorax, ACS, thoracic trauma, and pulmonary embolism.  I personally reviewed patient's records and note a PCP office visit on 12/26/2023 for hypertension.  Patient's presentation is most consistent with acute complicated illness / injury requiring diagnostic workup.  The patient is on the cardiac monitor to evaluate for evidence of arrhythmia and/or significant heart rate changes.  Will obtain sepsis workup, UA, chest x-ray.  Administer IV fluids.  Anticipate hospitalization.  Clinical Course as of 01/25/24 0636  Wed Jan 25, 2024  0209 Chest x-ray demonstrates multifocal pneumonia.  Will initiate ED sepsis order set with 30 cc/kilo IV fluids and broad-spectrum IV antibiotics. [JS]  [phone] Will add Motrin for continued fever although improving.  Lactic acid less than 2.  Laboratory results demonstrate minimal leukocytosis with WBC 10.9.  Minimal elevation in troponin which is likely secondary to demand ischemia.  Will consult hospitalist services for evaluation and admission. [JS]  0357 Repeat troponin elevated.  Will initiate IV heparin  with drip. [JS]    Clinical Course User Index [JS] Norlene Beavers, MD     FINAL CLINICAL IMPRESSION(S) / ED DIAGNOSES   Final diagnoses:  Sepsis, due to unspecified organism, unspecified whether acute organ dysfunction present [facility])  Multifocal pneumonia  Fever, unspecified fever cause  Hypoxia  Elevated troponin  NSTEMI (non-ST elevated myocardial infarction) (HCC)     Rx / DC Orders   ED Discharge Orders     None        Note:  This document was prepared using Dragon voice recognition  software and may include unintentional dictation errors.   Vineeth Fell J, MD 01/25/24 [phone]

## 2024-01-25 NOTE — ED Triage Notes (Signed)
 Pt to ED via [facility] from home c/o SHOB, cough, congestion. Per EMS, symptoms started last night. Pt O2 sat for EMS was 82%, pt was placed on 6L [place] o2 sat went up to 90%. EMS placed pt on 15L [facility], O2 sats 98%. Pt was given 4mg  zofran  and 1000mg  IV tylenol .

## 2024-01-25 NOTE — Progress Notes (Signed)
 PHARMACY - ANTICOAGULATION CONSULT NOTE  Pharmacy Consult for Heparin   Indication: chest pain/ACS  Allergies  Allergen Reactions   Iodinated Contrast Media Shortness Of Breath and Other (See Comments)    Can be premedicated  Have been able to tolerate if given premeds (benadryl , prednisone )   Ibuprofen Other (See Comments)    "stomach problems"    Sulfa Antibiotics Other (See Comments)    "Stomach problems"  "Stomach problems"  "Stomach problems"  "Stomach problems"   Doxycycline Diarrhea and Nausea And Vomiting   Tetracyclines & Related Nausea Only and Nausea And Vomiting    Patient Measurements: Weight: 94.3 kg (208 lb)  Vital Signs: Temp: 98.3 F (36.8 C) (04/23 0726) Temp Source: Oral (04/23 0726) BP: 103/54 (04/23 1100) Pulse Rate: 75 (04/23 1100)  Labs: Recent Labs    01/25/24 0112 01/25/24 0317 01/25/24 0440  HGB 11.3*  --   --   HCT 35.5*  --   --   PLT 166  --   --   APTT  --   --  35  LABPROT  --   --  16.2*  INR  --   --  1.3*  HEPARINUNFRC  --   --  >1.10*  CREATININE 0.90  --   --   TROPONINIHS 27* 433*  --     Estimated Creatinine Clearance: 60 mL/min (by C-G formula based on SCr of 0.9 mg/dL).   Medical History: Past Medical History:  Diagnosis Date   A-fib [facility])    Accelerated hypertension 10/31/2022   Age related osteoporosis    Cancer (HCC)    Melanoma x 2 -- Lt Leg -1976 Rt Arm - 2014   Chronic bilateral low back pain    Chronic kidney disease    Complication of anesthesia    DVT (deep venous thrombosis) (HCC) 1960   right leg  was on BC pills   Dysrhythmia    Family history of breast cancer    Family history of colon cancer    Family history of melanoma    Family history of prostate cancer    Fibrocystic breast disease    GERD (gastroesophageal reflux disease)    Headache    migraine   Heart murmur    not heard now.   History of kidney stones    Hyperlipidemia    Hypertension    IBS (irritable bowel syndrome)     Lumbago    Mitral valve prolapse    Neuropathy    Obstructive sleep apnea    Osteoarthritis of shoulder    PONV (postoperative nausea and vomiting)     Medications:  (Not in a hospital admission)   Assessment: Pharmacy consulted to dose heparin  in this 83 year old female admitted with NSTEMI.  Pt was on Eliquis  5 mg PO BID PTA, last dose on 4/22 PM. CrCl = 60 ml/min   Goal of Therapy:  Heparin  level 0.3-0.7 units/ml aPTT 66 - 102 seconds Monitor platelets by anticoagulation protocol: Yes    4/23 1249 aPTT > 200 sec, supratherapeutic @ 1250 u/hr  Plan:  - aPTT supratherapeutic at >200 sec - Hold heparin  infusion x 1 hour, then resume heparin  at 1050 units/hr - will use aPTT to guide dosing until correlating with HL - will check aPTT 8 hrs after rate change - will check HL on 4/24 with AM labs - CBC daily   Ramonita Burow, PharmD 01/25/2024,2:12 PM

## 2024-01-25 NOTE — ED Notes (Signed)
 CCMD called to initiate cardiac monitoring

## 2024-01-25 NOTE — Progress Notes (Signed)
 Elink monitoring for the code sepsis protocol.

## 2024-01-25 NOTE — Assessment & Plan Note (Signed)
 Continue home meds with hold parameters

## 2024-01-25 NOTE — ED Notes (Signed)
 Pt reports no urine output since being in hospital. Bladder scan obtained and showed at this time, Pts BP is relatively low, will inform MD via secure chat of these findings

## 2024-01-25 NOTE — Progress Notes (Signed)
 CODE SEPSIS - PHARMACY COMMUNICATION  **Broad Spectrum Antibiotics should be administered within 1 hour of Sepsis diagnosis**  Time Code Sepsis Called/Page Received: 4/23 @ 0211   Antibiotics Ordered: Ceftriaxone  , Azithromycin    Time of 1st antibiotic administration: Ceftriaxone  2 gm IV X 1 on 4/23 @ 0225   Additional action taken by pharmacy:   If necessary, Name of Provider/Nurse Contacted:     Marx Doig D ,PharmD Clinical Pharmacist  01/25/2024  2:38 AM

## 2024-01-25 NOTE — Assessment & Plan Note (Addendum)
 Coronary artery disease Suspect type II NSTEMI with troponin bump from 27-4 33 Denies chest pain and EKG is nonacute Will continue heparin  infusion given significant delta Continue home losartan , metoprolol , Imdur , nitroglycerin , rosuvastatin , apixaban  Cardiology consult Will get echo to evaluate for segmental wall motion abnormality

## 2024-01-26 ENCOUNTER — Encounter: Payer: Self-pay | Admitting: Student in an Organized Health Care Education/Training Program

## 2024-01-26 DIAGNOSIS — J189 Pneumonia, unspecified organism: Secondary | ICD-10-CM | POA: Diagnosis not present

## 2024-01-26 DIAGNOSIS — A419 Sepsis, unspecified organism: Secondary | ICD-10-CM | POA: Diagnosis not present

## 2024-01-26 DIAGNOSIS — I214 Non-ST elevation (NSTEMI) myocardial infarction: Secondary | ICD-10-CM | POA: Diagnosis not present

## 2024-01-26 LAB — CBC
HCT: 32.7 % — ABNORMAL LOW (ref 36.0–46.0)
Hemoglobin: 10 g/dL — ABNORMAL LOW (ref 12.0–15.0)
MCH: 29.2 pg (ref 26.0–34.0)
MCHC: 30.6 g/dL (ref 30.0–36.0)
MCV: 95.3 fL (ref 80.0–100.0)
Platelets: 154 10*3/uL (ref 150–400)
RBC: 3.43 MIL/uL — ABNORMAL LOW (ref 3.87–5.11)
RDW: 14 % (ref 11.5–15.5)
WBC: 18.4 10*3/uL — ABNORMAL HIGH (ref 4.0–10.5)
nRBC: 0 % (ref 0.0–0.2)

## 2024-01-26 LAB — BASIC METABOLIC PANEL WITH GFR
Anion gap: 11 (ref 5–15)
BUN: 28 mg/dL — ABNORMAL HIGH (ref 8–23)
CO2: 25 mmol/L (ref 22–32)
Calcium: 8.3 mg/dL — ABNORMAL LOW (ref 8.9–10.3)
Chloride: 104 mmol/L (ref 98–111)
Creatinine, Ser: 1.05 mg/dL — ABNORMAL HIGH (ref 0.44–1.00)
GFR, Estimated: 53 mL/min — ABNORMAL LOW (ref >60)
Glucose, Bld: 89 mg/dL (ref 70–99)
Potassium: 4 mmol/L (ref 3.5–5.1)
Sodium: 140 mmol/L (ref 135–145)

## 2024-01-26 LAB — ECHOCARDIOGRAM COMPLETE
Area-P 1/2: 5.46 cm2
Height: 71 in
S' Lateral: 3.5 cm
Weight: 3326.3 [oz_av]

## 2024-01-26 LAB — APTT
aPTT: 180 s (ref 24–36)
aPTT: 44 s — ABNORMAL HIGH (ref 24–36)
aPTT: 83 s — ABNORMAL HIGH (ref 24–36)

## 2024-01-26 LAB — HEPARIN LEVEL (UNFRACTIONATED): Heparin Unfractionated: 1.01 [IU]/mL — ABNORMAL HIGH (ref 0.30–0.70)

## 2024-01-26 LAB — CBG MONITORING, ED
Glucose-Capillary: 79 mg/dL (ref 70–99)
Glucose-Capillary: 92 mg/dL (ref 70–99)

## 2024-01-26 LAB — GLUCOSE, CAPILLARY
Glucose-Capillary: 74 mg/dL (ref 70–99)
Glucose-Capillary: 73 mg/dL (ref 70–99)

## 2024-01-26 MED ORDER — HEPARIN BOLUS VIA INFUSION
2000.0000 [IU] | Freq: Once | INTRAVENOUS | Status: AC
  Administered 2024-01-26: 2000 [IU] via INTRAVENOUS
  Filled 2024-01-26: qty 2000

## 2024-01-26 MED ORDER — GUAIFENESIN ER 600 MG PO TB12
600.0000 mg | ORAL_TABLET | Freq: Two times a day (BID) | ORAL | Status: DC
  Administered 2024-01-26 – 2024-01-27 (×3): 600 mg via ORAL
  Filled 2024-01-26 (×3): qty 1

## 2024-01-26 MED ORDER — LOSARTAN POTASSIUM 50 MG PO TABS
50.0000 mg | ORAL_TABLET | Freq: Every day | ORAL | Status: DC
  Administered 2024-01-26: 50 mg via ORAL
  Filled 2024-01-26: qty 1

## 2024-01-26 MED ORDER — HEPARIN (PORCINE) 25000 UT/250ML-% IV SOLN
1000.0000 [IU]/h | INTRAVENOUS | Status: DC
  Administered 2024-01-26: 750 [IU]/h via INTRAVENOUS
  Filled 2024-01-26: qty 250

## 2024-01-26 NOTE — ED Notes (Signed)
 Heparin  paused per order of pharmacy and provider

## 2024-01-26 NOTE — ED Notes (Signed)
Pt resting comfortably in bed at this time. Pt is alert and oriented with even and regular respirations. No acute distress noted. Pt denies any needs at this time. Call light within reach.  °

## 2024-01-26 NOTE — ED Notes (Signed)
 Secretary called to page provider about critical lab value

## 2024-01-26 NOTE — ED Notes (Signed)
 Pt helped up to bedside commode.

## 2024-01-26 NOTE — Progress Notes (Signed)
 PROGRESS NOTE  Sarah Riddle    DOB: 10-Sep-1941, 83 y.o.  WUJ:[id number]    Code Status: Full Code   DOA: 01/25/2024   LOS: 1   Brief hospital course  Sarah Riddle is a 83 y.o. female with medical history significant for OSA on CPAP, CAD, PAF s/p ablation 2019 on apixaban , HTN, chronic  pain, history of melanoma being admitted with sepsis secondary to pneumonia and elevated troponin 27-->433. Patient presented to the ED via EMS with a 1 day history of cough and congestion, fever and shortness of breath.  She has left-sided chest pain on coughing.  Denies lower extremity pain or swelling.  On arrival of EMS O2 sats 82% improving to 90% on 6 L.  She was subsequently placed on 15 L NRB ED course and data review: On arrival tachycardic to 107, tachypneic to 23 and febrile at 102.5.  BP 182/74.  O2 sat 92% on 6 L Labs notable for the following: WBC 11,000 with lactic acid 1.3, respiratory viral panel pending.  CMP unremarkable neck Urinalysis unremarkable Troponin 27-> 433 and BNP 522   EKG: a flutter at HR 113 Chest x-ray with pulmonary vascular congestion with mild interstitial edema and suspected superimposed bilateral upper lobe and bibasilar infiltrates left greater than right-recommendation for CT   Treatment in the ED: NS bolus 1 L x 2 Rocephin  and azithromycin  and heparin  bolus and infusion started for troponin 433  Patient was admitted to medicine service for further workup and management of hypoxia, NSTEMI as outlined in detail below.  01/26/24 -feeling well  Assessment & Plan  Principal Problem:   Sepsis due to pneumonia [facility]) Active Problems:   Acute respiratory failure with hypoxia (HCC)   NSTEMI (non-ST elevated myocardial infarction) (HCC)   OSA on CPAP   PAF with history of ablation (paroxysmal atrial fibrillation) (HCC)   Chronic pain   Chronic anticoagulation (Coumadin )   Essential hypertension, benign   CAD (coronary artery disease)  Sepsis due to pneumonia  (HCC) Acute respiratory failure with hypoxia Sepsis criteria include fever, tachycardia and tachypnea with leukocytosis Blood cultures positive for strep pneumoniae Originally requiring 6 L to maintain sats in the low 90s. Has now weaned to 3L and denies dyspnea. Endorses congestion.  She states that she dose use 2L O2 nightly with her CPAP.  - continue Rocephin  and azithromycin  - Antitussives, incentive spirometer continue supplemental oxygen  and wean as tolerated - CPAP nightly   NSTEMI  Coronary artery disease Suspect type II NSTEMI with troponin bump from 27-433 Denies chest pain and EKG is nonacute Will continue heparin  infusion x48 hours per cards recs Continue home losartan , metoprolol , Imdur , nitroglycerin , rosuvastatin , apixaban  Cardiology consult   Essential hypertension, benign Continue home meds with hold parameters   Chronic pain Continue gabapentin  and Tylenol    PAF with history of ablation (paroxysmal atrial fibrillation) (HCC) Chronic coagulation Continue metoprolol  Currently on heparin  infusion Eliquis  restart at dc   OSA on CPAP CPAP nightly  Body mass index is 29 kg/m.  VTE ppx: heparin   Diet:     Diet   Diet heart healthy/carb modified Room service appropriate? Yes; Fluid consistency: Thin   Consultants: Cardiology   Subjective 01/26/24    Pt reports feeling better today. Denies chest pain. With deep breath has left lower rib tenderness. Endorses congestion but not productive when coughing   Objective   Vitals:   01/26/24 0130 01/26/24 0330 01/26/24 0416 01/26/24 0642  BP: (!) 108/47 (!) 108/52  Aaron Aas)  143/59  Pulse: 70 67  71  Resp: 17 17  (!) 21  Temp:   98.9 F (37.2 C)   TempSrc:   Oral   SpO2: 100% 100%  99%  Weight:      Height:        Intake/Output Summary (Last 24 hours) at 01/26/2024 0848 Last data filed at 01/26/2024 0206 Gross per 24 hour  Intake 194.7 ml  Output 600 ml  Net -405.3 ml   Filed Weights   01/25/24 0102  01/25/24 1400  Weight: 94.3 kg 94.3 kg    Physical Exam:  General: awake, alert, NAD HEENT: atraumatic, clear conjunctiva, anicteric sclera, MMM, hearing grossly normal Respiratory: normal respiratory effort. Cardiovascular: quick capillary refill, normal S1/S2, RRR, no JVD, murmurs Gastrointestinal: soft, NT, ND Nervous: A&O x3. no gross focal neurologic deficits, normal speech Extremities: moves all equally, no edema, normal tone Skin: dry, intact, normal temperature, normal color. No rashes, lesions or ulcers on exposed skin Psychiatry: normal mood, congruent affect  Labs   I have personally reviewed the following labs and imaging studies CBC    Component Value Date/Time   WBC 18.4 (H) 01/26/2024 0414   RBC 3.43 (L) 01/26/2024 0414   HGB 10.0 (L) 01/26/2024 0414   HGB 10.4 (L) 03/23/2023 1018   HCT 32.7 (L) 01/26/2024 0414   HCT 32.6 (L) 03/23/2023 1018   PLT 154 01/26/2024 0414   MCV 95.3 01/26/2024 0414   MCV 92 03/23/2023 1018   MCH 29.2 01/26/2024 0414   MCHC 30.6 01/26/2024 0414   RDW 14.0 01/26/2024 0414   RDW 14.0 03/23/2023 1018   LYMPHSABS 1.9 01/25/2024 0112   LYMPHSABS 1.4 03/23/2023 1018   MONOABS 0.4 01/25/2024 0112   EOSABS 0.1 01/25/2024 0112   EOSABS 0.1 03/23/2023 1018   BASOSABS 0.0 01/25/2024 0112   BASOSABS 0.0 03/23/2023 1018      Latest Ref Rng & Units 01/26/2024    4:14 AM 01/25/2024    1:12 AM 12/22/2023   10:49 AM  BMP  Glucose 70 - 99 mg/dL 89  161  92   BUN 8 - 23 mg/dL 28  19  15    Creatinine 0.44 - 1.00 mg/dL 0.96  0.45  4.09   BUN/Creat Ratio 12 - 28   18   Sodium 135 - 145 mmol/L 140  136  144   Potassium 3.5 - 5.1 mmol/L 4.0  3.6  4.0   Chloride 98 - 111 mmol/L 104  101  106   CO2 22 - 32 mmol/L 25  27  28    Calcium  8.9 - 10.3 mg/dL 8.3  9.2  9.5     DG Chest Port 1 View Result Date: 01/25/2024 CLINICAL DATA:  Shortness of breath and cough. EXAM: PORTABLE CHEST 1 VIEW COMPARISON:  January 09, 2023 FINDINGS: The heart size and  mediastinal contours are within normal limits. Mild prominence of the central pulmonary vasculature is noted. There is marked severity calcification of the aortic arch. Mild diffusely increased interstitial lung markings are seen. Moderate severity superimposed left upper lobe infiltrate is noted. Mild superimposed right upper lobe and bibasilar infiltrates are also suspected. No pleural effusion or pneumothorax is identified. Postoperative changes are seen involving the right humerus. Multilevel degenerative changes seen throughout the thoracic spine. IMPRESSION: 1. Mild pulmonary vascular congestion with mild interstitial edema. 2. Suspected superimposed bilateral upper lobe and bibasilar infiltrates, left greater than right. Further evaluation with chest CT is recommended. Electronically Signed   By:  Virgle Grime M.D.   On: 01/25/2024 01:38    Disposition Plan & Communication  Patient status: Inpatient  Admitted From: Home Planned disposition location: Home Anticipated discharge date: 4/25 pending clinical improvement   Family Communication: none at bedside    Author: Ree Candy, DO [facility] 01/26/2024, 8:48 AM   Available by Epic secure chat 7AM-7PM. If 7PM-7AM, please contact night-coverage.  [facility] contact information found on [email or web address].

## 2024-01-26 NOTE — Progress Notes (Signed)
 [facility] CLINIC CARDIOLOGY PROGRESS NOTE       Patient ID: Sarah Riddle MRN: [id number] DOB/AGE: [birth year] 84 y.o.  Admit date: 01/25/2024 Referring Physician Dr. Vallarie Gauze Primary Physician Aisha Hove, MD  Primary Cardiologist Dr. Phyllis Breeze Reason for Consultation NSTEMI  HPI: Sarah Riddle is a 82 y.o. female  with a past medical history of nonobstructive coronary artery disease, paroxysmal atrial fibrillation s/p ablation 2019 (on Eliquis ), OSA on CPAP, hypertension, hx of melonoma, chronic pain who presented to the ED on 01/25/2024 for cough, congestion, fever, and SOB. Admitted due to sepsis secondary to pneumonia. During ED work up found to have elevated troponins. Cardiology was consulted for further evaluation.   Interval history: -Patient seen and examined this AM, reports feeling better overall.  -SOB feels the same today, cough has improved.  -Continues to deny any chest pain.   Review of systems complete and found to be negative unless listed above    Past Medical History:  Diagnosis Date   A-fib [facility])    Accelerated hypertension 10/31/2022   Age related osteoporosis    Cancer (HCC)    Melanoma x 2 -- Lt Leg -1976 Rt Arm - 2014   Chronic bilateral low back pain    Chronic kidney disease    Complication of anesthesia    DVT (deep venous thrombosis) (HCC) 1960   right leg  was on BC pills   Dysrhythmia    Family history of breast cancer    Family history of colon cancer    Family history of melanoma    Family history of prostate cancer    Fibrocystic breast disease    GERD (gastroesophageal reflux disease)    Headache    migraine   Heart murmur    not heard now.   History of kidney stones    Hyperlipidemia    Hypertension    IBS (irritable bowel syndrome)    Lumbago    Mitral valve prolapse    Neuropathy    Obstructive sleep apnea    Osteoarthritis of shoulder    PONV (postoperative nausea and vomiting)     Past Surgical History:  Procedure  Laterality Date   ABDOMINAL HYSTERECTOMY     BACK SURGERY     BREAST BIOPSY Left 04/20/2023   us  bx/ heart clip/ path pending   BREAST BIOPSY Left 04/20/2023   US  LT BREAST BX W LOC DEV 1ST LESION IMG BX SPEC US  GUIDE 04/20/2023 [facility]MO[facility]   BREAST CYST ASPIRATION Left 2001   Negative   CARDIAC ELECTROPHYSIOLOGY MAPPING AND ABLATION     EYE SURGERY     HUMERUS IM NAIL Right 05/05/2022   Procedure: INTRAMEDULLARY (IM) NAIL HUMERAL;  Surgeon: Laneta Pintos, MD;  Location: [facility] OR;  Service: Orthopedics;  Laterality: Right;   HYSTEROTOMY     L4-L5  2020   titaum rebok cage   LEFT HEART CATH AND CORONARY ANGIOGRAPHY N/A 11/01/2022   Procedure: LEFT HEART CATH AND CORONARY ANGIOGRAPHY and possible PCI and stent;  Surgeon: Cherrie Cornwall, MD;  Location: [facility] INVASIVE CV LAB;  Service: Cardiovascular;  Laterality: N/A;   TOTAL HIP ARTHROPLASTY Left     (Not in a hospital admission)  Social History   Socioeconomic History   Marital status: Married    Spouse name: Not on file   Number of children: Not on file   Years of education: Not on file   Highest education level: Not on file  Occupational History  Not on file  Tobacco Use   Smoking status: Never   Smokeless tobacco: Never  Vaping Use   Vaping status: Never Used  Substance and Sexual Activity   Alcohol  use: No   Drug use: No   Sexual activity: Not Currently  Other Topics Concern   Not on file  Social History Narrative   Not on file   Social Drivers of Health   Financial Resource Strain: Low Risk  (09/23/2023)   Overall Financial Resource Strain (CARDIA)    Difficulty of Paying Living Expenses: Not hard at all  Food Insecurity: No Food Insecurity (09/12/2023)   Hunger Vital Sign    Worried About Running Out of Food in the Last Year: Never true    Ran Out of Food in the Last Year: Never true  Transportation Needs: No Transportation Needs (09/12/2023)   PRAPARE - Administrator, Civil Service  (Medical): No    Lack of Transportation (Non-Medical): No  Physical Activity: Inactive (09/23/2023)   Exercise Vital Sign    Days of Exercise per Week: 0 days    Minutes of Exercise per Session: 0 min  Stress: No Stress Concern Present (09/23/2023)   [employer] of Occupational Health - Occupational Stress Questionnaire    Feeling of Stress : Only a little  Social Connections: Not on file  Intimate Partner Violence: Not At Risk (09/12/2023)   Humiliation, Afraid, Rape, and Kick questionnaire    Fear of Current or Ex-Partner: No    Emotionally Abused: No    Physically Abused: No    Sexually Abused: No    Family History  Problem Relation Age of Onset   Colon cancer Mother 67   Prostate cancer Father        dx 50s   Cancer Sister    Acute myelogenous leukemia Sister        dx 58s   Prostate cancer Brother    Melanoma Brother    Prostate cancer Brother    Melanoma Brother    HIV/AIDS Brother 52   Tuberculosis Paternal Aunt    Melanoma Other    Breast cancer Other    Melanoma Niece      Vitals:   01/26/24 0130 01/26/24 0330 01/26/24 0416 01/26/24 0642  BP: (!) 108/47 (!) 108/52  (!) 143/59  Pulse: 70 67  71  Resp: 17 17  (!) 21  Temp:   98.9 F (37.2 C)   TempSrc:   Oral   SpO2: 100% 100%  99%  Weight:      Height:        PHYSICAL EXAM General: well appearing elderly female, well nourished, in no acute distress. HEENT: Normocephalic and atraumatic. Neck: No JVD.  Lungs: Normal respiratory effort on 3L. Clear bilaterally to auscultation. No wheezes, crackles, rhonchi.  Heart: HRRR. Normal S1 and S2 without gallops or murmurs.  Abdomen: Non-distended appearing.  Msk: Normal strength and tone for age. Extremities: Warm and well perfused. No clubbing, cyanosis. No edema.  Neuro: Alert and oriented X 3. Psych: Answers questions appropriately.   Labs: Basic Metabolic Panel: Recent Labs    01/25/24 0112 01/26/24 0414  NA 136 140  K 3.6 4.0  CL 101 104   CO2 27 25  GLUCOSE 169* 89  BUN 19 28*  CREATININE 0.90 1.05*  CALCIUM  9.2 8.3*   Liver Function Tests: Recent Labs    01/25/24 0112  AST 21  ALT 11  ALKPHOS 60  BILITOT 0.6  PROT  6.3*  ALBUMIN 3.7   No results for input(s): "LIPASE", "AMYLASE" in the last 72 hours. CBC: Recent Labs    01/25/24 0112 01/26/24 0414  WBC 10.9* 18.4*  NEUTROABS 8.5*  --   HGB 11.3* 10.0*  HCT 35.5* 32.7*  MCV 91.0 95.3  PLT 166 154   Cardiac Enzymes: Recent Labs    01/25/24 0112 01/25/24 0317  TROPONINIHS 27* 433*   BNP: Recent Labs    01/25/24 0112  BNP 522.6*   D-Dimer: No results for input(s): "DDIMER" in the last 72 hours. Hemoglobin A1C: No results for input(s): "HGBA1C" in the last 72 hours. Fasting Lipid Panel: No results for input(s): "CHOL", "HDL", "LDLCALC", "TRIG", "CHOLHDL", "LDLDIRECT" in the last 72 hours. Thyroid  Function Tests: No results for input(s): "TSH", "T4TOTAL", "T3FREE", "THYROIDAB" in the last 72 hours.  Invalid input(s): "FREET3" Anemia Panel: No results for input(s): "VITAMINB12", "FOLATE", "FERRITIN", "TIBC", "IRON", "RETICCTPCT" in the last 72 hours.   Radiology: [facility] Chest Port 1 View Result Date: 01/25/2024 CLINICAL DATA:  Shortness of breath and cough. EXAM: PORTABLE CHEST 1 VIEW COMPARISON:  January 09, 2023 FINDINGS: The heart size and mediastinal contours are within normal limits. Mild prominence of the central pulmonary vasculature is noted. There is marked severity calcification of the aortic arch. Mild diffusely increased interstitial lung markings are seen. Moderate severity superimposed left upper lobe infiltrate is noted. Mild superimposed right upper lobe and bibasilar infiltrates are also suspected. No pleural effusion or pneumothorax is identified. Postoperative changes are seen involving the right humerus. Multilevel degenerative changes seen throughout the thoracic spine. IMPRESSION: 1. Mild pulmonary vascular congestion with mild  interstitial edema. 2. Suspected superimposed bilateral upper lobe and bibasilar infiltrates, left greater than right. Further evaluation with chest CT is recommended. Electronically Signed   By: Virgle Grime M.D.   On: 01/25/2024 01:38   LHC:  11/01/2022 (Dr. Meredeth Stallion) 1st Mrg lesion is 30% stenosed. LV end diastolic pressure is normal. The left ventricular ejection fraction is 50-55% by visual estimate.  ECHO pending  TELEMETRY reviewed by me 01/26/2024: sinus rhythm, rate 70s  EKG reviewed by me: Sinus rhythm, rate 113 bpm   Data reviewed by me 01/26/2024: last 24h vitals tele labs imaging I/O hospitalist progress note  Principal Problem:   Sepsis due to pneumonia [facility]) Active Problems:   OSA on CPAP   PAF with history of ablation (paroxysmal atrial fibrillation) (HCC)   Chronic pain   Chronic anticoagulation (Coumadin )   Acute respiratory failure with hypoxia (HCC)   Essential hypertension, benign   NSTEMI (non-ST elevated myocardial infarction) (HCC)   CAD (coronary artery disease)    ASSESSMENT AND PLAN:  Sarah Riddle is a 83 y.o. female  with a past medical history of coronary artery disease, paroxysmal atrial fibrillation s/p ablation 2019 (on Eliquis ), OSA on CPAP, hypertension, hx of melonoma, chronic pain who presented to the ED on 01/25/2024 for cough, congestion, fever, and SOB. Admitted due to sepsis secondary to pneumonia. During ED work up found to have elevated troponins. Cardiology was consulted for further evaluation.   # Sepsis, secondary to pneumonia # Acute hypoxic respiratory failure # NSTEMI type 1 vs type 2 # Paroxysmal atrial fibrillation s/p ablation (2019) # Hypertension  Patient presented to the ED due to 1 day of cough, congestion, fever and shortness of breath. Code sepsis called in ED in setting of pneumonia. CXR revealed pulmonary vascular congestion with bilateral infiltrates. BNP elevated 522. Trops trending 27 > 433. Patient had  recent LHC in  10/2022 with no significant coronary artery disease. EKG in ED with Sinus rhythm, rate 113 bpm. This AM BP is borderline with stable HR. Per tele patient had remained in sinus rhythm. Patient denies any chest pain.  -Echo pending -Elevated troponins in the setting of sepsis secondary to pneumonia and acute hypoxic respiratory failure most consistent with demand/supply mismatch and not ACS. -Continue heparin  gtt for 48 hours - will complete tomorrow AM. When off heparin  will resume home Eliquis . -Continue ASA 81 mg, rosuvastatin  40 mg daily. -Continue home lasix  20 mg twice daily. Closely monitor renal function. -Continue metoprolol  succinate 25 mg daily. -Defer resuming home imdur  as patient BP is borderline and no significant CAD per LHC done in 10/2022. -Increase losartan  to 50 mg daily.  -Defer resuming home hydralazine  due to borderline BP.  -Sepsis, PNA management per primary.  This patient's plan of care was discussed and created with Dr. Beau Bound and he is in agreement.  Signed: Hamp Levine, PA-C  01/26/2024, 8:17 AM [facility] Cardiology

## 2024-01-26 NOTE — Progress Notes (Signed)
 PHARMACY - ANTICOAGULATION CONSULT NOTE  Pharmacy Consult for Heparin   Indication: chest pain/ACS  Allergies  Allergen Reactions   Iodinated Contrast Media Shortness Of Breath and Other (See Comments)    Can be premedicated  Have been able to tolerate if given premeds (benadryl , prednisone )   Ibuprofen Other (See Comments)    "stomach problems"    Sulfa Antibiotics Other (See Comments)    "Stomach problems"  "Stomach problems"  "Stomach problems"  "Stomach problems"   Doxycycline Diarrhea and Nausea And Vomiting   Tetracyclines & Related Nausea Only and Nausea And Vomiting    Patient Measurements: Height: 5\' 11"  (180.3 cm) Weight: 94.3 kg (207 lb 14.3 oz) IBW/kg (Calculated) : 70.8 HEPARIN  DW (KG): 90.2  Vital Signs: Temp: 100.7 F (38.2 C) (04/23 2150) Temp Source: Oral (04/23 2150) BP: 107/49 (04/23 2330) Pulse Rate: 74 (04/23 2330)  Labs: Recent Labs    01/25/24 0112 01/25/24 0317 01/25/24 0440 01/25/24 1249 01/25/24 2344  HGB 11.3*  --   --   --   --   HCT 35.5*  --   --   --   --   PLT 166  --   --   --   --   APTT  --   --  35 >200* 180*  LABPROT  --   --  16.2*  --   --   INR  --   --  1.3*  --   --   HEPARINUNFRC  --   --  >1.10*  --   --   CREATININE 0.90  --   --   --   --   TROPONINIHS 27* 433*  --   --   --     Estimated Creatinine Clearance: 60 mL/min (by C-G formula based on SCr of 0.9 mg/dL).   Medical History: Past Medical History:  Diagnosis Date   A-fib [facility])    Accelerated hypertension 10/31/2022   Age related osteoporosis    Cancer (HCC)    Melanoma x 2 -- Lt Leg -1976 Rt Arm - 2014   Chronic bilateral low back pain    Chronic kidney disease    Complication of anesthesia    DVT (deep venous thrombosis) (HCC) 1960   right leg  was on BC pills   Dysrhythmia    Family history of breast cancer    Family history of colon cancer    Family history of melanoma    Family history of prostate cancer    Fibrocystic breast disease     GERD (gastroesophageal reflux disease)    Headache    migraine   Heart murmur    not heard now.   History of kidney stones    Hyperlipidemia    Hypertension    IBS (irritable bowel syndrome)    Lumbago    Mitral valve prolapse    Neuropathy    Obstructive sleep apnea    Osteoarthritis of shoulder    PONV (postoperative nausea and vomiting)     Medications:  (Not in a hospital admission)   Assessment: Pharmacy consulted to dose heparin  in this 83 year old female admitted with NSTEMI.  Pt was on Eliquis  5 mg PO BID PTA, last dose on 4/22 PM. CrCl = 60 ml/min   Goal of Therapy:  Heparin  level 0.3-0.7 units/ml aPTT 66 - 102 seconds Monitor platelets by anticoagulation protocol: Yes    4/23 1249 aPTT > 200 sec, supratherapeutic @ 1250 u/hr 4/24 2344 aPTT  180 sec, supratherapteuic @ 1050 u/hr  Plan:  4/23:  aPTT @ 2344 = 180 sec, SUPRAtherapeutic - will hold heparin  infusion for 1 hr and restart at 750 units/hr  - will recheck aPTT and HL 8 hrs after restart - CBC daily   Roben Tatsch D, PharmD 01/26/2024,1:33 AM

## 2024-01-26 NOTE — Progress Notes (Signed)
 PHARMACY - ANTICOAGULATION CONSULT NOTE  Pharmacy Consult for Heparin   Indication: chest pain/ACS  Allergies  Allergen Reactions   Iodinated Contrast Media Shortness Of Breath and Other (See Comments)    Can be premedicated  Have been able to tolerate if given premeds (benadryl , prednisone )   Ibuprofen Other (See Comments)    "stomach problems"    Sulfa Antibiotics Other (See Comments)    "Stomach problems"  "Stomach problems"  "Stomach problems"  "Stomach problems"   Doxycycline Diarrhea and Nausea And Vomiting   Tetracyclines & Related Nausea Only and Nausea And Vomiting    Patient Measurements: Height: 5\' 11"  (180.3 cm) Weight: 94.3 kg (207 lb 14.3 oz) IBW/kg (Calculated) : 70.8 HEPARIN  DW (KG): 90.2  Vital Signs: Temp: 98.5 F (36.9 C) (04/24 1303) Temp Source: Oral (04/24 1303) BP: 134/60 (04/24 1300) Pulse Rate: 67 (04/24 1300)  Labs: Recent Labs    01/25/24 0112 01/25/24 0317 01/25/24 0440 01/25/24 0440 01/25/24 1249 01/25/24 2344 01/26/24 0414 01/26/24 1329  HGB 11.3*  --   --   --   --   --  10.0*  --   HCT 35.5*  --   --   --   --   --  32.7*  --   PLT 166  --   --   --   --   --  154  --   APTT  --   --  35   < > >200* 180*  --  44*  LABPROT  --   --  16.2*  --   --   --   --   --   INR  --   --  1.3*  --   --   --   --   --   HEPARINUNFRC  --   --  >1.10*  --   --   --   --  1.01*  CREATININE 0.90  --   --   --   --   --  1.05*  --   TROPONINIHS 27* 433*  --   --   --   --   --   --    < > = values in this interval not displayed.    Estimated Creatinine Clearance: 51.4 mL/min (A) (by C-G formula based on SCr of 1.05 mg/dL (H)).   Medical History: Past Medical History:  Diagnosis Date   A-fib [facility])    Accelerated hypertension 10/31/2022   Age related osteoporosis    Cancer (HCC)    Melanoma x 2 -- Lt Leg -1976 Rt Arm - 2014   Chronic bilateral low back pain    Chronic kidney disease    Complication of anesthesia    DVT (deep venous  thrombosis) (HCC) 1960   right leg  was on BC pills   Dysrhythmia    Family history of breast cancer    Family history of colon cancer    Family history of melanoma    Family history of prostate cancer    Fibrocystic breast disease    GERD (gastroesophageal reflux disease)    Headache    migraine   Heart murmur    not heard now.   History of kidney stones    Hyperlipidemia    Hypertension    IBS (irritable bowel syndrome)    Lumbago    Mitral valve prolapse    Neuropathy    Obstructive sleep apnea    Osteoarthritis of shoulder    PONV (postoperative  nausea and vomiting)     Medications:  (Not in a hospital admission)   Assessment: Pharmacy consulted to dose heparin  in this 83 year old female admitted with NSTEMI.  Pt was on Eliquis  5 mg PO BID PTA, last dose on 4/22 PM. CrCl = 60 ml/min   Goal of Therapy:  Heparin  level 0.3-0.7 units/ml aPTT 66 - 102 seconds Monitor platelets by anticoagulation protocol: Yes    4/23 1249 aPTT > 200 sec, supratherapeutic @ 1250 u/hr 4/24 2344 aPTT    180 sec, supratherapteuic @ 1050 u/hr 4/24 1329 aPTT 44 sec/HL 1.01, aPTT subtherapeutic @ 750 u/hr  Plan:  aPTT subtherapeutic x 1 Will give heparin  2500 units IV x 1 Increase heparin  infusion rate to 1000 units/hr Recheck aPTT 8 hours after rate change Adjust based on aPTT until correlation with HL Daily HL and CBC while on heparin   Ramonita Burow, PharmD 01/26/2024,2:00 PM

## 2024-01-26 NOTE — Progress Notes (Signed)
 PHARMACY - ANTICOAGULATION CONSULT NOTE  Pharmacy Consult for Heparin   Indication: chest pain/ACS  Allergies  Allergen Reactions   Iodinated Contrast Media Shortness Of Breath and Other (See Comments)    Can be premedicated  Have been able to tolerate if given premeds (benadryl , prednisone )   Ibuprofen Other (See Comments)    "stomach problems"    Sulfa Antibiotics Other (See Comments)    "Stomach problems"  "Stomach problems"  "Stomach problems"  "Stomach problems"   Doxycycline Diarrhea and Nausea And Vomiting   Tetracyclines & Related Nausea Only and Nausea And Vomiting    Patient Measurements: Height: 5\' 11"  (180.3 cm) Weight: 94.3 kg (207 lb 14.3 oz) IBW/kg (Calculated) : 70.8 HEPARIN  DW (KG): 90.2  Vital Signs: Temp: 100.7 F (38.2 C) (04/23 2150) Temp Source: Oral (04/23 2150) BP: 108/47 (04/24 0130) Pulse Rate: 70 (04/24 0130)  Labs: Recent Labs    01/25/24 0112 01/25/24 0317 01/25/24 0440 01/25/24 1249 01/25/24 2344  HGB 11.3*  --   --   --   --   HCT 35.5*  --   --   --   --   PLT 166  --   --   --   --   APTT  --   --  35 >200* 180*  LABPROT  --   --  16.2*  --   --   INR  --   --  1.3*  --   --   HEPARINUNFRC  --   --  >1.10*  --   --   CREATININE 0.90  --   --   --   --   TROPONINIHS 27* 433*  --   --   --     Estimated Creatinine Clearance: 60 mL/min (by C-G formula based on SCr of 0.9 mg/dL).   Medical History: Past Medical History:  Diagnosis Date   A-fib [facility])    Accelerated hypertension 10/31/2022   Age related osteoporosis    Cancer (HCC)    Melanoma x 2 -- Lt Leg -1976 Rt Arm - 2014   Chronic bilateral low back pain    Chronic kidney disease    Complication of anesthesia    DVT (deep venous thrombosis) (HCC) 1960   right leg  was on BC pills   Dysrhythmia    Family history of breast cancer    Family history of colon cancer    Family history of melanoma    Family history of prostate cancer    Fibrocystic breast disease     GERD (gastroesophageal reflux disease)    Headache    migraine   Heart murmur    not heard now.   History of kidney stones    Hyperlipidemia    Hypertension    IBS (irritable bowel syndrome)    Lumbago    Mitral valve prolapse    Neuropathy    Obstructive sleep apnea    Osteoarthritis of shoulder    PONV (postoperative nausea and vomiting)     Medications:  (Not in a hospital admission)   Assessment: Pharmacy consulted to dose heparin  in this 83 year old female admitted with NSTEMI.  Pt was on Eliquis  5 mg PO BID PTA, last dose on 4/22 PM. CrCl = 60 ml/min   Goal of Therapy:  Heparin  level 0.3-0.7 units/ml aPTT 66 - 102 seconds Monitor platelets by anticoagulation protocol: Yes    4/23 1249 aPTT > 200 sec, supratherapeutic @ 1250 u/hr 4/24 2344 aPTT  180 sec, supratherapteuic @ 1050 u/hr  Plan:  4/23:  aPTT @ 2344 = 180 sec, SUPRAtherapeutic - will hold heparin  infusion for 1 hr and restart at 750 units/hr  - will recheck aPTT and HL 8 hrs after restart - CBC daily   Tymeka Privette D, PharmD 01/26/2024,3:13 AM

## 2024-01-27 DIAGNOSIS — R0902 Hypoxemia: Secondary | ICD-10-CM

## 2024-01-27 DIAGNOSIS — R7989 Other specified abnormal findings of blood chemistry: Secondary | ICD-10-CM

## 2024-01-27 DIAGNOSIS — J189 Pneumonia, unspecified organism: Secondary | ICD-10-CM | POA: Diagnosis not present

## 2024-01-27 DIAGNOSIS — A419 Sepsis, unspecified organism: Secondary | ICD-10-CM | POA: Diagnosis not present

## 2024-01-27 LAB — BASIC METABOLIC PANEL WITH GFR
Anion gap: 6 (ref 5–15)
BUN: 23 mg/dL (ref 8–23)
CO2: 25 mmol/L (ref 22–32)
Calcium: 8.4 mg/dL — ABNORMAL LOW (ref 8.9–10.3)
Chloride: 108 mmol/L (ref 98–111)
Creatinine, Ser: 0.93 mg/dL (ref 0.44–1.00)
GFR, Estimated: >60 mL/min (ref >60)
Glucose, Bld: 89 mg/dL (ref 70–99)
Potassium: 3.7 mmol/L (ref 3.5–5.1)
Sodium: 139 mmol/L (ref 135–145)

## 2024-01-27 LAB — APTT: aPTT: 73 s — ABNORMAL HIGH (ref 24–36)

## 2024-01-27 LAB — CBC
HCT: 30 % — ABNORMAL LOW (ref 36.0–46.0)
Hemoglobin: 9.5 g/dL — ABNORMAL LOW (ref 12.0–15.0)
MCH: 29.1 pg (ref 26.0–34.0)
MCHC: 31.7 g/dL (ref 30.0–36.0)
MCV: 92 fL (ref 80.0–100.0)
Platelets: 126 10*3/uL — ABNORMAL LOW (ref 150–400)
RBC: 3.26 MIL/uL — ABNORMAL LOW (ref 3.87–5.11)
RDW: 13.8 % (ref 11.5–15.5)
WBC: 14.4 10*3/uL — ABNORMAL HIGH (ref 4.0–10.5)
nRBC: 0 % (ref 0.0–0.2)

## 2024-01-27 LAB — GLUCOSE, CAPILLARY: Glucose-Capillary: 82 mg/dL (ref 70–99)

## 2024-01-27 LAB — HEPARIN LEVEL (UNFRACTIONATED): Heparin Unfractionated: 0.48 [IU]/mL (ref 0.30–0.70)

## 2024-01-27 MED ORDER — LOSARTAN POTASSIUM 100 MG PO TABS: 100.0000 mg | ORAL_TABLET | Freq: Every day | ORAL | 0 refills | Status: DC

## 2024-01-27 MED ORDER — CEPHALEXIN 500 MG PO CAPS: 500.0000 mg | ORAL_CAPSULE | Freq: Two times a day (BID) | ORAL | 0 refills | Status: AC

## 2024-01-27 MED ORDER — ASPIRIN 81 MG PO TBEC: 81.0000 mg | DELAYED_RELEASE_TABLET | Freq: Every day | ORAL | 0 refills | Status: DC

## 2024-01-27 MED ORDER — FUROSEMIDE 20 MG PO TABS: 20.0000 mg | ORAL_TABLET | Freq: Two times a day (BID) | ORAL | Status: DC | PRN

## 2024-01-27 MED ORDER — LOSARTAN POTASSIUM 50 MG PO TABS
100.0000 mg | ORAL_TABLET | Freq: Every day | ORAL | Status: DC
  Administered 2024-01-27: 100 mg via ORAL
  Filled 2024-01-27: qty 2

## 2024-01-27 MED ORDER — APIXABAN 5 MG PO TABS
5.0000 mg | ORAL_TABLET | Freq: Two times a day (BID) | ORAL | Status: DC
  Administered 2024-01-27: 5 mg via ORAL
  Filled 2024-01-27: qty 1

## 2024-01-27 NOTE — Discharge Summary (Signed)
 Physician Discharge Summary  Patient: Sarah Riddle JYN:[id number] DOB: [birth year]   Code Status: Full Code Admit date: 01/25/2024 Discharge date: 01/27/2024 Disposition: Home, No home health services recommended PCP: Aisha Hove, MD  Recommendations for Outpatient Follow-up:  Follow up with PCP within 1-2 weeks Regarding general hospital follow up and preventative care Follow up with cardiology   Discharge Diagnoses:  Principal Problem:   Sepsis due to pneumonia [facility]) Active Problems:   Acute respiratory failure with hypoxia (HCC)   NSTEMI (non-ST elevated myocardial infarction) (HCC)   OSA on CPAP   PAF with history of ablation (paroxysmal atrial fibrillation) (HCC)   Chronic pain   Chronic anticoagulation (Coumadin )   Essential hypertension, benign   CAD (coronary artery disease)   Multifocal pneumonia   Hypoxia  Brief Hospital Course Summary: Sarah Riddle is a 83 y.o. female with medical history significant for OSA on CPAP and 2L O2 QHS, CAD, PAF s/p ablation 2019 on apixaban , HTN, chronic  pain, history of melanoma being admitted with sepsis secondary to pneumonia.  ED course: O2 82% improving to 90% on 6 L.  She was subsequently placed on 15 L NRB for brief time. tachycardic to 107, tachypneic to 23 and febrile at 102.5.  BP 182/74.  O2 sat 92% on 6 L Labs notable for the following: WBC 11,000 with lactic acid 1.3, respiratory viral panel pending.  CMP unremarkable neck Urinalysis unremarkable Troponin 27-> 433 and BNP 522   EKG: a flutter at HR 113 Chest x-ray with pulmonary vascular congestion with mild interstitial edema and suspected superimposed bilateral upper lobe and bibasilar infiltrates left greater than right-recommendation for CT Treatment in the ED: NS bolus 1 L x 2 Rocephin  and azithromycin  and heparin  bolus and infusion started for troponin 433. Cardiology was consulted to evaluate for cardiac ischemia and recommended 48 hours of heparin  gtt.  She completed this without any acute issues and no chest pain. No further ischemic workup was completed inpatient.   Her pneumonia was treated with IV Abx and transitioned to PO to complete course after dc. She was able to wean to room air even with ambulation prior to dc so will need to continue only with her at bedtime oxygen  as previously using.   BP medications were modified by cardiology as listed below.   All other chronic conditions were treated with home medications.    Discharge Condition: Good, improved Recommended discharge diet: Regular healthy diet  Consultations: Cardiology   Procedures/Studies: None  Allergies as of 01/27/2024       Reactions   Iodinated Contrast Media Shortness Of Breath, Other (See Comments)   Can be premedicated Have been able to tolerate if given premeds (benadryl , prednisone )   Ibuprofen Other (See Comments)   "stomach problems"   Sulfa Antibiotics Other (See Comments)   "Stomach problems" "Stomach problems"  "Stomach problems"  "Stomach problems"   Doxycycline Diarrhea, Nausea And Vomiting   Tetracyclines & Related Nausea Only, Nausea And Vomiting        Medication List     STOP taking these medications    hydrALAZINE  25 MG tablet Commonly known as: APRESOLINE    isosorbide  mononitrate 60 MG 24 hr tablet Commonly known as: IMDUR    senna-docusate 8.6-50 MG tablet Commonly known as: Senokot-S       TAKE these medications    acetaminophen  500 MG tablet Commonly known as: TYLENOL  Take 500 mg by mouth every 6 (six) hours as needed for moderate pain (pain  score 4-6) or mild pain (pain score 1-3). Once a day   alendronate  70 MG tablet Commonly known as: FOSAMAX  Take 1 tablet (70 mg total) by mouth every Thursday. Take with a full glass of water on an empty stomach. What changed: when to take this   allopurinol  100 MG tablet Commonly known as: ZYLOPRIM  Take 1 tablet (100 mg total) by mouth daily.   apixaban  5 MG Tabs  tablet Commonly known as: Eliquis  Take 1 tablet (5 mg total) by mouth 2 (two) times daily.   aspirin  EC 81 MG tablet Take 1 tablet (81 mg total) by mouth daily. Swallow whole. Start taking on: January 28, 2024   azelastine  0.1 % nasal spray Commonly known as: ASTELIN  Place 2 sprays into both nostrils at bedtime. Use in each nostril as directed What changed:  when to take this reasons to take this   cephALEXin  500 MG capsule Commonly known as: KEFLEX  Take 1 capsule (500 mg total) by mouth 2 (two) times daily for 7 days.   cyanocobalamin  1000 MCG tablet Commonly known as: VITAMIN B12 Take 1,000 mcg by mouth daily. Monday to Friday   EPINEPHrine  0.3 mg/0.3 mL Soaj injection Commonly known as: EPI-PEN Inject 0.3 mg into the muscle as needed for anaphylaxis.   fluticasone  50 MCG/ACT nasal spray Commonly known as: FLONASE  Place 2 sprays into both nostrils daily as needed for allergies or rhinitis.   furosemide  20 MG tablet Commonly known as: Lasix  Take 1 tablet (20 mg total) by mouth 2 (two) times daily. What changed:  when to take this reasons to take this   gabapentin  800 MG tablet Commonly known as: NEURONTIN  TAKE 1 TABLET FOUR TIMES DAILY   levocetirizine 5 MG tablet Commonly known as: XYZAL  TAKE 1 TABLET EVERY EVENING   losartan  100 MG tablet Commonly known as: COZAAR  Take 1 tablet (100 mg total) by mouth daily. Start taking on: January 28, 2024 What changed:  medication strength how much to take   meclizine  12.5 MG tablet Commonly known as: ANTIVERT  Take 1 tablet (12.5 mg total) by mouth 3 (three) times daily as needed for dizziness.   metoprolol  succinate 25 MG 24 hr tablet Commonly known as: TOPROL -XL TAKE 1 TABLET EVERY DAY What changed: when to take this   nitroGLYCERIN  0.4 MG SL tablet Commonly known as: NITROSTAT  Place 0.4 mg under the tongue every 5 (five) minutes as needed for chest pain.   pantoprazole  40 MG tablet Commonly known as:  PROTONIX  TAKE 1 TABLET EVERY DAY   rosuvastatin  40 MG tablet Commonly known as: CRESTOR  TAKE 1 TABLET EVERY DAY What changed: when to take this   sertraline  25 MG tablet Commonly known as: ZOLOFT  TAKE 1 TABLET THREE TIMES DAILY   Systane Hydration PF 0.4-0.3 % Soln Generic drug: Polyethyl Glyc-Propyl Glyc PF Apply 1 drop to eye daily as needed (dry eyes).   Vitamin D  (Ergocalciferol ) 50000 units Caps Take 1 capsule by mouth once a week.        Follow-up Information     Cherrie Cornwall, MD. Go in 1 week(s).   Specialty: Cardiology Contact information: [address] [place] [address] [phone]         Aisha Hove, MD Follow up.   Specialty: Internal Medicine Why: hospital follow up Contact information: [address] [place] [address] [phone]                Subjective   Pt reports feeling well. Tolerating diet and  ambulation without issue. Continues to have mild residual cough. No CP or SOB.   All questions and concerns were addressed at time of discharge.  Objective  Blood pressure (!) 170/80, pulse 81, temperature 98.3 F (36.8 C), resp. rate 17, height 5\' 11"  (1.803 m), weight 94.3 kg, SpO2 93%.   General: Pt is alert, awake, not in acute distress Cardiovascular: RRR, S1/S2 +, no rubs, no gallops Respiratory: CTA bilaterally, no wheezing, no rhonchi Abdominal: Soft, NT, ND, bowel sounds + Extremities: no edema, no cyanosis  The results of significant diagnostics from this hospitalization (including imaging, microbiology, ancillary and laboratory) are listed below for reference.   Imaging studies: ECHOCARDIOGRAM COMPLETE Result Date: 01/26/2024    ECHOCARDIOGRAM REPORT   Patient Name:   BHAVANA MCMANAMY Date of Exam: 01/25/2024 Medical Rec #:  [id number]        Height:       71.0 in Accession #:    [id number]       Weight:       207.9 lb Date of Birth:  Sep 13, 1941       BSA:          2.144 m Patient Age:    82 years         BP:            120/56 mmHg Patient Gender: F                HR:           81 bpm. Exam Location:  [facility] Procedure: 2D Echo, Cardiac Doppler and Color Doppler (Both Spectral and Color            Flow Doppler were utilized during procedure). Indications:     I21.4 NSTEMI  History:         Patient has prior history of Echocardiogram examinations, most                  recent 02/04/2023. Mitral Valve Prolapse, Arrythmias:Atrial                  Fibrillation, Signs/Symptoms:Murmur; Risk Factors:Dyslipidemia                  and Hypertension. Obstructive sleep apnea.  Sonographer:     Brigid Canada RDCS Referring Phys:  [id number] CARALYN HUDSON Diagnosing Phys: Dwayne D Callwood MD IMPRESSIONS  1. Left ventricular ejection fraction, by estimation, is 60 to 65%. The left ventricle has moderate to severely decreased function. The left ventricle has no regional wall motion abnormalities. Left ventricular diastolic parameters are consistent with Grade III diastolic dysfunction (restrictive).  2. Right ventricular systolic function is normal. The right ventricular size is normal.  3. The mitral valve is normal in structure. Mild mitral valve regurgitation.  4. The aortic valve is normal in structure. Aortic valve regurgitation is not visualized. FINDINGS  Left Ventricle: Left ventricular ejection fraction, by estimation, is 60 to 65%. The left ventricle has moderate to severely decreased function. The left ventricle has no regional wall motion abnormalities. Strain was performed and the global longitudinal strain is indeterminate. The left ventricular internal cavity size was normal in size. There is no left ventricular hypertrophy. Left ventricular diastolic parameters are consistent with Grade III diastolic dysfunction (restrictive). Right Ventricle: The right ventricular size is normal. No increase in right ventricular wall thickness. Right ventricular systolic function is normal. Left Atrium: Left atrial size was normal in  size. Right Atrium: Right atrial size was normal  in size. Pericardium: There is no evidence of pericardial effusion. Mitral Valve: The mitral valve is normal in structure. Mild mitral valve regurgitation. Tricuspid Valve: The tricuspid valve is normal in structure. Tricuspid valve regurgitation is trivial. Aortic Valve: The aortic valve is normal in structure. Aortic valve regurgitation is not visualized. Pulmonic Valve: The pulmonic valve was normal in structure. Pulmonic valve regurgitation is not visualized. Aorta: The ascending aorta was not well visualized. IAS/Shunts: No atrial level shunt detected by color flow Doppler. Additional Comments: 3D was performed not requiring image post processing on an independent workstation and was indeterminate.  LEFT VENTRICLE PLAX 2D LVIDd:         5.50 cm   Diastology LVIDs:         3.50 cm   LV e' medial:    8.09 cm/s LV PW:         0.90 cm   LV E/e' medial:  17.1 LV IVS:        0.70 cm   LV e' lateral:   9.97 cm/s LVOT diam:     2.30 cm   LV E/e' lateral: 13.9 LV SV:         92 LV SV Index:   43 LVOT Area:     4.15 cm  RIGHT VENTRICLE             IVC RV Basal diam:  5.10 cm     IVC diam: 2.40 cm RV S prime:     17.03 cm/s TAPSE (M-mode): 1.9 cm LEFT ATRIUM           Index        RIGHT ATRIUM           Index LA diam:      5.00 cm 2.33 cm/m   RA Area:     23.60 cm LA Vol (A2C): 61.2 ml 28.55 ml/m  RA Volume:   86.60 ml  40.40 ml/m LA Vol (A4C): 52.3 ml 24.40 ml/m  AORTIC VALVE LVOT Vmax:   114.67 cm/s LVOT Vmean:  78.333 cm/s LVOT VTI:    0.221 m  AORTA Ao Root diam: 3.40 cm Ao Asc diam:  3.70 cm MITRAL VALVE                TRICUSPID VALVE MV Area (PHT): 5.46 cm     TR Peak grad:   35.3 mmHg MV Decel Time: 139 msec     TR Vmax:        297.00 cm/s MV E velocity: 138.50 cm/s MV A velocity: 55.10 cm/s   SHUNTS MV E/A ratio:  2.51         Systemic VTI:  0.22 m                             Systemic Diam: 2.30 cm Antonette Batters MD Electronically signed by Antonette Batters MD Signature Date/Time: 01/26/2024/2:12:35 PM    Final    DG Chest Port 1 View Result Date: 01/25/2024 CLINICAL DATA:  Shortness of breath and cough. EXAM: PORTABLE CHEST 1 VIEW COMPARISON:  January 09, 2023 FINDINGS: The heart size and mediastinal contours are within normal limits. Mild prominence of the central pulmonary vasculature is noted. There is marked severity calcification of the aortic arch. Mild diffusely increased interstitial lung markings are seen. Moderate severity superimposed left upper lobe infiltrate is noted. Mild superimposed right upper lobe and bibasilar infiltrates are also suspected. No  pleural effusion or pneumothorax is identified. Postoperative changes are seen involving the right humerus. Multilevel degenerative changes seen throughout the thoracic spine. IMPRESSION: 1. Mild pulmonary vascular congestion with mild interstitial edema. 2. Suspected superimposed bilateral upper lobe and bibasilar infiltrates, left greater than right. Further evaluation with chest CT is recommended. Electronically Signed   By: Virgle Grime M.D.   On: 01/25/2024 01:38    Labs: Basic Metabolic Panel: Recent Labs  Lab 01/25/24 0112 01/26/24 0414 01/27/24 0618  NA 136 140 139  K 3.6 4.0 3.7  CL 101 104 108  CO2 27 25 25   GLUCOSE 169* 89 89  BUN 19 28* 23  CREATININE 0.90 1.05* 0.93  CALCIUM  9.2 8.3* 8.4*   CBC: Recent Labs  Lab 01/25/24 0112 01/26/24 0414 01/27/24 0618  WBC 10.9* 18.4* 14.4*  NEUTROABS 8.5*  --   --   HGB 11.3* 10.0* 9.5*  HCT 35.5* 32.7* 30.0*  MCV 91.0 95.3 92.0  PLT 166 154 126*   Microbiology: Results for orders placed or performed during the hospital encounter of 01/25/24  Culture, blood (routine x 2)     Status: Abnormal (Preliminary result)   Collection Time: 01/25/24  1:10 AM   Specimen: BLOOD  Result Value Ref Range Status   Specimen Description   Final    BLOOD LEFT ANTECUBITAL Performed at [facility], [address],  [place], [place] [address]    Special Requests   Final    BOTTLES DRAWN AEROBIC AND ANAEROBIC Blood Culture results may not be optimal due to an inadequate volume of blood received in culture bottles Performed at [facility], [address] [place], [place] [address]    Culture  Setup Time   Final    GRAM POSITIVE COCCI IN BOTH AEROBIC AND ANAEROBIC BOTTLES CRITICAL VALUE NOTED.  VALUE IS CONSISTENT WITH PREVIOUSLY REPORTED AND CALLED VALUE. Performed at [facility] [address], [place], [place] [address]    Culture STREPTOCOCCUS PNEUMONIAE (A)  Final   Report Status PENDING  Incomplete  Culture, blood (routine x 2)     Status: Abnormal (Preliminary result)   Collection Time: 01/25/24  1:15 AM   Specimen: BLOOD  Result Value Ref Range Status   Specimen Description   Final    BLOOD RIGHT ANTECUBITAL Performed at [facility], [address] [place], [place] [address]    Special Requests   Final    BOTTLES DRAWN AEROBIC AND ANAEROBIC Blood Culture results may not be optimal due to an inadequate volume of blood received in culture bottles Performed at [facility], [address] [place], [place] [address]    Culture  Setup Time   Final    GRAM POSITIVE COCCI IN BOTH AEROBIC AND ANAEROBIC BOTTLES Organism ID to follow CRITICAL RESULT CALLED TO, READ BACK BY AND VERIFIED WITH: Marguerite Shiley 1616 01/25/24 MU Performed at [facility], [address] [place], [place] [address]    Culture (A)  Final    STREPTOCOCCUS PNEUMONIAE SUSCEPTIBILITIES TO FOLLOW Performed at [facility], [address], [place], [place] [address]    Report Status PENDING  Incomplete  Blood Culture ID Panel (Reflexed)     Status: Abnormal   Collection Time: 01/25/24  1:15 AM  Result Value Ref Range Status   Enterococcus faecalis NOT DETECTED NOT DETECTED Final   Enterococcus Faecium NOT DETECTED NOT DETECTED Final   Listeria monocytogenes NOT  DETECTED NOT DETECTED Final  Staphylococcus species NOT DETECTED NOT DETECTED Final   Staphylococcus aureus (BCID) NOT DETECTED NOT DETECTED Final   Staphylococcus epidermidis NOT DETECTED NOT DETECTED Final   Staphylococcus lugdunensis NOT DETECTED NOT DETECTED Final   Streptococcus species DETECTED (A) NOT DETECTED Final    Comment: CRITICAL RESULT CALLED TO, READ BACK BY AND VERIFIED WITH: TREY GREENWOOD 1616 01/25/24 MU    Streptococcus agalactiae NOT DETECTED NOT DETECTED Final   Streptococcus pneumoniae DETECTED (A) NOT DETECTED Final    Comment: CRITICAL RESULT CALLED TO, READ BACK BY AND VERIFIED WITH: TREY GREENWOOD 1616 01/25/24 MU    Streptococcus pyogenes NOT DETECTED NOT DETECTED Final   A.calcoaceticus-baumannii NOT DETECTED NOT DETECTED Final   Bacteroides fragilis NOT DETECTED NOT DETECTED Final   Enterobacterales NOT DETECTED NOT DETECTED Final   Enterobacter cloacae complex NOT DETECTED NOT DETECTED Final   Escherichia coli NOT DETECTED NOT DETECTED Final   Klebsiella aerogenes NOT DETECTED NOT DETECTED Final   Klebsiella oxytoca NOT DETECTED NOT DETECTED Final   Klebsiella pneumoniae NOT DETECTED NOT DETECTED Final   Proteus species NOT DETECTED NOT DETECTED Final   Salmonella species NOT DETECTED NOT DETECTED Final   Serratia marcescens NOT DETECTED NOT DETECTED Final   Haemophilus influenzae NOT DETECTED NOT DETECTED Final   Neisseria meningitidis NOT DETECTED NOT DETECTED Final   Pseudomonas aeruginosa NOT DETECTED NOT DETECTED Final   Stenotrophomonas maltophilia NOT DETECTED NOT DETECTED Final   Candida albicans NOT DETECTED NOT DETECTED Final   Candida auris NOT DETECTED NOT DETECTED Final   Candida glabrata NOT DETECTED NOT DETECTED Final   Candida krusei NOT DETECTED NOT DETECTED Final   Candida parapsilosis NOT DETECTED NOT DETECTED Final   Candida tropicalis NOT DETECTED NOT DETECTED Final   Cryptococcus neoformans/gattii NOT DETECTED NOT DETECTED Final     Comment: Performed at [facility], [address] [place], [place] [address]  Resp panel by RT-PCR (RSV, Flu A&B, Covid) Anterior Nasal Swab     Status: None   Collection Time: 01/25/24  3:48 AM   Specimen: Anterior Nasal Swab  Result Value Ref Range Status   SARS Coronavirus 2 by RT PCR NEGATIVE NEGATIVE Final    Comment: (NOTE) SARS-CoV-2 target nucleic acids are NOT DETECTED.  The SARS-CoV-2 RNA is generally detectable in upper respiratory specimens during the acute phase of infection. The lowest concentration of SARS-CoV-2 viral copies this assay can detect is 138 copies/mL. A negative result does not preclude SARS-Cov-2 infection and should not be used as the sole basis for treatment or other patient management decisions. A negative result may occur with  improper specimen collection/handling, submission of specimen other than nasopharyngeal swab, presence of viral mutation(s) within the areas targeted by this assay, and inadequate number of viral copies(<138 copies/mL). A negative result must be combined with clinical observations, patient history, and epidemiological information. The expected result is Negative.  Fact Sheet for Patients:  BloggerCourse.com  Fact Sheet for Healthcare Providers:  SeriousBroker.it  This test is no t yet approved or cleared by the [place]  FDA and  has been authorized for detection and/or diagnosis of SARS-CoV-2 by FDA under an Emergency Use Authorization (EUA). This EUA will remain  in effect (meaning this test can be used) for the duration of the COVID-19 declaration under Section 564(b)(1) of the Act, 21 U.S.C.section 360bbb-3(b)(1), unless the authorization is terminated  or revoked sooner.       Influenza A by PCR NEGATIVE NEGATIVE Final   Influenza B by  PCR NEGATIVE NEGATIVE Final    Comment: (NOTE) The Xpert Xpress SARS-CoV-2/FLU/RSV plus assay is intended as an  aid in the diagnosis of influenza from Nasopharyngeal swab specimens and should not be used as a sole basis for treatment. Nasal washings and aspirates are unacceptable for Xpert Xpress SARS-CoV-2/FLU/RSV testing.  Fact Sheet for Patients: BloggerCourse.com  Fact Sheet for Healthcare Providers: SeriousBroker.it  This test is not yet approved or cleared by the [place]  FDA and has been authorized for detection and/or diagnosis of SARS-CoV-2 by FDA under an Emergency Use Authorization (EUA). This EUA will remain in effect (meaning this test can be used) for the duration of the COVID-19 declaration under Section 564(b)(1) of the Act, 21 U.S.C. section 360bbb-3(b)(1), unless the authorization is terminated or revoked.     Resp Syncytial Virus by PCR NEGATIVE NEGATIVE Final    Comment: (NOTE) Fact Sheet for Patients: BloggerCourse.com  Fact Sheet for Healthcare Providers: SeriousBroker.it  This test is not yet approved or cleared by the [place]  FDA and has been authorized for detection and/or diagnosis of SARS-CoV-2 by FDA under an Emergency Use Authorization (EUA). This EUA will remain in effect (meaning this test can be used) for the duration of the COVID-19 declaration under Section 564(b)(1) of the Act, 21 U.S.C. section 360bbb-3(b)(1), unless the authorization is terminated or revoked.  Performed at [facility], [address] [place], [place] [address]     Time coordinating discharge: Over 30 minutes  Ree Candy, MD  [facility] 01/27/2024, 11:30 AM

## 2024-01-27 NOTE — TOC Transition Note (Signed)
 Transition of Care [facility]) - Discharge Note   Patient Details  Name: Sarah Riddle MRN: [id number] Date of Birth: 1941/08/08  Transition of Care [facility]) CM/SW Contact:  Crayton Docker, RN 01/27/2024, 11:46 AM   Clinical Narrative:     Discharge orders noted. Patient discharged to home/self care.  Final next level of care: Home/Self Care Barriers to Discharge: No Barriers Identified   Patient Goals and CMS Choice    Home/self care   Discharge Placement     Home/self care             Discharge Plan and Services Additional resources added to the After Visit Summary for      Home/self care              Social Drivers of Health (SDOH) Interventions SDOH Screenings   Food Insecurity: No Food Insecurity (01/26/2024)  Housing: Low Risk  (01/26/2024)  Transportation Needs: No Transportation Needs (01/26/2024)  Utilities: Not At Risk (01/26/2024)  Alcohol Screen: Low Risk  (09/23/2023)  Depression (PHQ2-9): Medium Risk (09/23/2023)  Financial Resource Strain: Low Risk  (09/23/2023)  Physical Activity: Inactive (09/23/2023)  Social Connections: Patient Declined (01/26/2024)  Stress: No Stress Concern Present (09/23/2023)  Tobacco Use: Low Risk  (01/26/2024)  Recent Concern: Tobacco Use - Medium Risk (12/08/2023)   Received from [facility]  Health Literacy: Adequate Health Literacy (09/23/2023)     Readmission Risk Interventions    09/02/2023   11:38 AM 08/31/2023   12:10 PM 11/01/2022    3:44 PM  Readmission Risk Prevention Plan  Transportation Screening Complete Complete Complete  PCP or Specialist Appt within 3-5 Days Complete  Complete  HRI or Home Care Consult   Not Complete  HRI or Home Care Consult comments   NA  Social Work Consult for Recovery Care Planning/Counseling Complete Complete Not Complete  SW consult not completed comments   NA  Palliative Care Screening Not Applicable Not Applicable Not Applicable  Medication Review (RN Care  Manager) Complete Complete Referral to Pharmacy

## 2024-01-27 NOTE — Discharge Instructions (Addendum)
 Please review your medication list carefully, your blood pressure medications have been changed by cardiology and you have been started on a daily baby aspirin . Please follow up with cardiology in 3-4 weeks. I have prescribed a further 7 days of antibiotics for your pneumonia. You did not require oxygen  while walking but I would recommend you continue to use it at night with your CPAP as you have been

## 2024-01-27 NOTE — Progress Notes (Signed)
 SATURATION QUALIFICATIONS: (This note is used to comply with regulatory documentation for home oxygen )  Patient Saturations on Room Air at Rest = 94%  Patient Saturations on Room Air while Ambulating = 92%  Patient Saturations on 1 Liters of oxygen  while Ambulating = 96%  Please briefly explain why patient needs home oxygen :

## 2024-01-27 NOTE — Progress Notes (Signed)
 PHARMACY - ANTICOAGULATION CONSULT NOTE  Pharmacy Consult for Heparin   Indication: chest pain/ACS  Allergies  Allergen Reactions   Iodinated Contrast Media Shortness Of Breath and Other (See Comments)    Can be premedicated  Have been able to tolerate if given premeds (benadryl , prednisone )   Ibuprofen Other (See Comments)    "stomach problems"    Sulfa Antibiotics Other (See Comments)    "Stomach problems"  "Stomach problems"  "Stomach problems"  "Stomach problems"   Doxycycline Diarrhea and Nausea And Vomiting   Tetracyclines & Related Nausea Only and Nausea And Vomiting    Patient Measurements: Height: 5\' 11"  (180.3 cm) Weight: 94.3 kg (207 lb 14.3 oz) IBW/kg (Calculated) : 70.8 HEPARIN  DW (KG): 90.2  Vital Signs: Temp: 98.1 F (36.7 C) (04/25 0014) Temp Source: Oral (04/24 2042) BP: 146/72 (04/25 0014) Pulse Rate: 76 (04/25 0014)  Labs: Recent Labs    01/25/24 0112 01/25/24 0317 01/25/24 0440 01/25/24 1249 01/25/24 2344 01/26/24 0414 01/26/24 1329 01/26/24 2321  HGB 11.3*  --   --   --   --  10.0*  --   --   HCT 35.5*  --   --   --   --  32.7*  --   --   PLT 166  --   --   --   --  154  --   --   APTT  --   --  35   < > 180*  --  44* 83*  LABPROT  --   --  16.2*  --   --   --   --   --   INR  --   --  1.3*  --   --   --   --   --   HEPARINUNFRC  --   --  >1.10*  --   --   --  1.01*  --   CREATININE 0.90  --   --   --   --  1.05*  --   --   TROPONINIHS 27* 433*  --   --   --   --   --   --    < > = values in this interval not displayed.    Estimated Creatinine Clearance: 51.4 mL/min (A) (by C-G formula based on SCr of 1.05 mg/dL (H)).   Medical History: Past Medical History:  Diagnosis Date   A-fib [facility])    Accelerated hypertension 10/31/2022   Age related osteoporosis    Cancer (HCC)    Melanoma x 2 -- Lt Leg -1976 Rt Arm - 2014   Chronic bilateral low back pain    Chronic kidney disease    Complication of anesthesia    DVT (deep venous  thrombosis) (HCC) 1960   right leg  was on BC pills   Dysrhythmia    Family history of breast cancer    Family history of colon cancer    Family history of melanoma    Family history of prostate cancer    Fibrocystic breast disease    GERD (gastroesophageal reflux disease)    Headache    migraine   Heart murmur    not heard now.   History of kidney stones    Hyperlipidemia    Hypertension    IBS (irritable bowel syndrome)    Lumbago    Mitral valve prolapse    Neuropathy    Obstructive sleep apnea    Osteoarthritis of shoulder    PONV (postoperative  nausea and vomiting)     Medications:  Medications Prior to Admission  Medication Sig Dispense Refill Last Dose/Taking   acetaminophen  (TYLENOL ) 500 MG tablet Take 500 mg by mouth every 6 (six) hours as needed for moderate pain (pain score 4-6) or mild pain (pain score 1-3). Once a day   Past Week   alendronate  (FOSAMAX ) 70 MG tablet Take 1 tablet (70 mg total) by mouth every Thursday. Take with a full glass of water on an empty stomach. (Patient taking differently: Take 70 mg by mouth every Tuesday. Take with a full glass of water on an empty stomach.) 12 tablet 2 01/24/2024 at  8:00 AM   allopurinol  (ZYLOPRIM ) 100 MG tablet Take 1 tablet (100 mg total) by mouth daily. 90 tablet 1 01/24/2024 at  8:00 AM   apixaban  (ELIQUIS ) 5 MG TABS tablet Take 1 tablet (5 mg total) by mouth 2 (two) times daily. 60 tablet 0 01/24/2024 at  9:00 PM   azelastine  (ASTELIN ) 0.1 % nasal spray Place 2 sprays into both nostrils at bedtime. Use in each nostril as directed (Patient taking differently: Place 2 sprays into both nostrils daily as needed for allergies or rhinitis. Use in each nostril as directed) 30 mL 12 Unknown   cyanocobalamin  (VITAMIN B12) 1000 MCG tablet Take 1,000 mcg by mouth daily. Monday to Friday   01/24/2024 at  8:00 AM   EPINEPHrine  0.3 mg/0.3 mL IJ SOAJ injection Inject 0.3 mg into the muscle as needed for anaphylaxis.   Unknown    fluticasone  (FLONASE ) 50 MCG/ACT nasal spray Place 2 sprays into both nostrils daily as needed for allergies or rhinitis.   Unknown   furosemide  (LASIX ) 20 MG tablet Take 1 tablet (20 mg total) by mouth 2 (two) times daily. (Patient taking differently: Take 20 mg by mouth 2 (two) times daily as needed for fluid or edema.) 60 tablet 0 Past Week   gabapentin  (NEURONTIN ) 800 MG tablet TAKE 1 TABLET FOUR TIMES DAILY 360 tablet 3 01/24/2024 at  9:00 PM   hydrALAZINE  (APRESOLINE ) 25 MG tablet TAKE 1 TABLET TWICE DAILY 180 tablet 3 01/24/2024 at  9:00 PM   isosorbide  mononitrate (IMDUR ) 60 MG 24 hr tablet TAKE 1 TABLET EVERY DAY (Patient taking differently: Take 60 mg by mouth at bedtime.) 90 tablet 3 01/24/2024 at  9:00 PM   levocetirizine (XYZAL ) 5 MG tablet TAKE 1 TABLET EVERY EVENING 90 tablet 3 01/24/2024 at  9:00 PM   losartan  (COZAAR ) 25 MG tablet TAKE 1 TABLET EVERY DAY 90 tablet 3 01/24/2024 at  9:00 PM   meclizine  (ANTIVERT ) 12.5 MG tablet Take 1 tablet (12.5 mg total) by mouth 3 (three) times daily as needed for dizziness. 30 tablet 0 Past Week   metoprolol  succinate (TOPROL -XL) 25 MG 24 hr tablet TAKE 1 TABLET EVERY DAY (Patient taking differently: Take 25 mg by mouth at bedtime.) 90 tablet 3 01/24/2024 at  9:00 PM   nitroGLYCERIN  (NITROSTAT ) 0.4 MG SL tablet Place 0.4 mg under the tongue every 5 (five) minutes as needed for chest pain.   Unknown   pantoprazole  (PROTONIX ) 40 MG tablet TAKE 1 TABLET EVERY DAY 90 tablet 3 01/24/2024 at  8:00 AM   Polyethyl Glyc-Propyl Glyc PF (SYSTANE HYDRATION PF) 0.4-0.3 % SOLN Apply 1 drop to eye daily as needed (dry eyes).   Unknown   rosuvastatin  (CRESTOR ) 40 MG tablet TAKE 1 TABLET EVERY DAY (Patient taking differently: Take 40 mg by mouth at bedtime.) 90 tablet 3 01/24/2024 at  9:00 PM   senna-docusate (SENOKOT-S) 8.6-50 MG tablet Take 1 tablet by mouth at bedtime as needed for mild constipation. 90 tablet 0 Unknown   sertraline  (ZOLOFT ) 25 MG tablet TAKE 1 TABLET  THREE TIMES DAILY 180 tablet 5 01/24/2024 at  9:00 PM   Vitamin D , Ergocalciferol , 50000 units CAPS Take 1 capsule by mouth once a week. 12 capsule 3 01/20/2024    Assessment: Pharmacy consulted to dose heparin  in this 83 year old female admitted with NSTEMI.  Pt was on Eliquis  5 mg PO BID PTA, last dose on 4/22 PM. CrCl = 60 ml/min   Goal of Therapy:  Heparin  level 0.3-0.7 units/ml aPTT 66 - 102 seconds Monitor platelets by anticoagulation protocol: Yes    4/23 1249 aPTT > 200 sec, supratherapeutic @ 1250 u/hr 4/24 2344 aPTT    180 sec, supratherapteuic @ 1050 u/hr 4/24 1329 aPTT 44 sec/HL 1.01, aPTT subtherapeutic @ 750 u/hr 4/24 2321 aPTT 83 sec, therapeutic X 1   Plan:  4/24:  aPTT @ 2321 = 83, therapeutic X 1 - Will continue pt on current rate and recheck aPTT and HL on 4/25 @ 0700.  Adjust based on aPTT until correlation with HL Daily HL and CBC while on heparin   Iyanla Eilers D, PharmD 01/27/2024,1:01 AM

## 2024-01-27 NOTE — Progress Notes (Signed)
 [facility] CLINIC CARDIOLOGY PROGRESS NOTE       Patient ID: Sarah Riddle MRN: [id number] DOB/AGE: 84-Apr-1942 84 y.o.  Admit date: 01/25/2024 Referring Physician Dr. Vallarie Gauze Primary Physician Aisha Hove, MD  Primary Cardiologist Dr. Phyllis Breeze Reason for Consultation NSTEMI  HPI: Sarah Riddle is a 83 y.o. female  with a past medical history of nonobstructive coronary artery disease, paroxysmal atrial fibrillation s/p ablation 2019 (on Eliquis ), OSA on CPAP, hypertension, hx of melonoma, chronic pain who presented to the ED on 01/25/2024 for cough, congestion, fever, and SOB. Admitted due to sepsis secondary to pneumonia. During ED work up found to have elevated troponins. Cardiology was consulted for further evaluation.   Interval history: -Patient seen and examined this AM, reports feeling better overall. Reports poor appetite. -States SOB is improved today, still on supplemental O2.  -Continues to deny any chest pain.   Review of systems complete and found to be negative unless listed above    Past Medical History:  Diagnosis Date   A-fib [facility])    Accelerated hypertension 10/31/2022   Age related osteoporosis    Cancer (HCC)    Melanoma x 2 -- Lt Leg -1976 Rt Arm - 2014   Chronic bilateral low back pain    Chronic kidney disease    Complication of anesthesia    DVT (deep venous thrombosis) (HCC) 1960   right leg  was on BC pills   Dysrhythmia    Family history of breast cancer    Family history of colon cancer    Family history of melanoma    Family history of prostate cancer    Fibrocystic breast disease    GERD (gastroesophageal reflux disease)    Headache    migraine   Heart murmur    not heard now.   History of kidney stones    Hyperlipidemia    Hypertension    IBS (irritable bowel syndrome)    Lumbago    Mitral valve prolapse    Neuropathy    Obstructive sleep apnea    Osteoarthritis of shoulder    PONV (postoperative nausea and vomiting)     Past  Surgical History:  Procedure Laterality Date   ABDOMINAL HYSTERECTOMY     BACK SURGERY     BREAST BIOPSY Left 04/20/2023   us  bx/ heart clip/ path pending   BREAST BIOPSY Left 04/20/2023   US  LT BREAST BX W LOC DEV 1ST LESION IMG BX SPEC US  GUIDE 04/20/2023 [facility]   BREAST CYST ASPIRATION Left 2001   Negative   CARDIAC ELECTROPHYSIOLOGY MAPPING AND ABLATION     EYE SURGERY     HUMERUS IM NAIL Right 05/05/2022   Procedure: INTRAMEDULLARY (IM) NAIL HUMERAL;  Surgeon: Laneta Pintos, MD;  Location: [facility] OR;  Service: Orthopedics;  Laterality: Right;   HYSTEROTOMY     L4-L5  2020   titaum rebok cage   LEFT HEART CATH AND CORONARY ANGIOGRAPHY N/A 11/01/2022   Procedure: LEFT HEART CATH AND CORONARY ANGIOGRAPHY and possible PCI and stent;  Surgeon: Cherrie Cornwall, MD;  Location: [facility] INVASIVE CV LAB;  Service: Cardiovascular;  Laterality: N/A;   TOTAL HIP ARTHROPLASTY Left     Medications Prior to Admission  Medication Sig Dispense Refill Last Dose/Taking   acetaminophen  (TYLENOL ) 500 MG tablet Take 500 mg by mouth every 6 (six) hours as needed for moderate pain (pain score 4-6) or mild pain (pain score 1-3). Once a day   Past Week   alendronate  (  FOSAMAX ) 70 MG tablet Take 1 tablet (70 mg total) by mouth every Thursday. Take with a full glass of water on an empty stomach. (Patient taking differently: Take 70 mg by mouth every Tuesday. Take with a full glass of water on an empty stomach.) 12 tablet 2 01/24/2024 at  8:00 AM   allopurinol  (ZYLOPRIM ) 100 MG tablet Take 1 tablet (100 mg total) by mouth daily. 90 tablet 1 01/24/2024 at  8:00 AM   apixaban  (ELIQUIS ) 5 MG TABS tablet Take 1 tablet (5 mg total) by mouth 2 (two) times daily. 60 tablet 0 01/24/2024 at  9:00 PM   azelastine  (ASTELIN ) 0.1 % nasal spray Place 2 sprays into both nostrils at bedtime. Use in each nostril as directed (Patient taking differently: Place 2 sprays into both nostrils daily as needed for allergies or rhinitis. Use  in each nostril as directed) 30 mL 12 Unknown   cyanocobalamin  (VITAMIN B12) 1000 MCG tablet Take 1,000 mcg by mouth daily. Monday to Friday   01/24/2024 at  8:00 AM   EPINEPHrine  0.3 mg/0.3 mL IJ SOAJ injection Inject 0.3 mg into the muscle as needed for anaphylaxis.   Unknown   fluticasone  (FLONASE ) 50 MCG/ACT nasal spray Place 2 sprays into both nostrils daily as needed for allergies or rhinitis.   Unknown   furosemide  (LASIX ) 20 MG tablet Take 1 tablet (20 mg total) by mouth 2 (two) times daily. (Patient taking differently: Take 20 mg by mouth 2 (two) times daily as needed for fluid or edema.) 60 tablet 0 Past Week   gabapentin  (NEURONTIN ) 800 MG tablet TAKE 1 TABLET FOUR TIMES DAILY 360 tablet 3 01/24/2024 at  9:00 PM   hydrALAZINE  (APRESOLINE ) 25 MG tablet TAKE 1 TABLET TWICE DAILY 180 tablet 3 01/24/2024 at  9:00 PM   isosorbide  mononitrate (IMDUR ) 60 MG 24 hr tablet TAKE 1 TABLET EVERY DAY (Patient taking differently: Take 60 mg by mouth at bedtime.) 90 tablet 3 01/24/2024 at  9:00 PM   levocetirizine (XYZAL ) 5 MG tablet TAKE 1 TABLET EVERY EVENING 90 tablet 3 01/24/2024 at  9:00 PM   losartan  (COZAAR ) 25 MG tablet TAKE 1 TABLET EVERY DAY 90 tablet 3 01/24/2024 at  9:00 PM   meclizine  (ANTIVERT ) 12.5 MG tablet Take 1 tablet (12.5 mg total) by mouth 3 (three) times daily as needed for dizziness. 30 tablet 0 Past Week   metoprolol  succinate (TOPROL -XL) 25 MG 24 hr tablet TAKE 1 TABLET EVERY DAY (Patient taking differently: Take 25 mg by mouth at bedtime.) 90 tablet 3 01/24/2024 at  9:00 PM   nitroGLYCERIN  (NITROSTAT ) 0.4 MG SL tablet Place 0.4 mg under the tongue every 5 (five) minutes as needed for chest pain.   Unknown   pantoprazole  (PROTONIX ) 40 MG tablet TAKE 1 TABLET EVERY DAY 90 tablet 3 01/24/2024 at  8:00 AM   Polyethyl Glyc-Propyl Glyc PF (SYSTANE HYDRATION PF) 0.4-0.3 % SOLN Apply 1 drop to eye daily as needed (dry eyes).   Unknown   rosuvastatin  (CRESTOR ) 40 MG tablet TAKE 1 TABLET EVERY DAY  (Patient taking differently: Take 40 mg by mouth at bedtime.) 90 tablet 3 01/24/2024 at  9:00 PM   senna-docusate (SENOKOT-S) 8.6-50 MG tablet Take 1 tablet by mouth at bedtime as needed for mild constipation. 90 tablet 0 Unknown   sertraline  (ZOLOFT ) 25 MG tablet TAKE 1 TABLET THREE TIMES DAILY 180 tablet 5 01/24/2024 at  9:00 PM   Vitamin D , Ergocalciferol , 50000 units CAPS Take 1 capsule by  mouth once a week. 12 capsule 3 01/20/2024   Social History   Socioeconomic History   Marital status: Married    Spouse name: Not on file   Number of children: Not on file   Years of education: Not on file   Highest education level: Not on file  Occupational History   Not on file  Tobacco Use   Smoking status: Never   Smokeless tobacco: Never  Vaping Use   Vaping status: Never Used  Substance and Sexual Activity   Alcohol use: No   Drug use: No   Sexual activity: Not Currently  Other Topics Concern   Not on file  Social History Narrative   Not on file   Social Drivers of Health   Financial Resource Strain: Low Risk  (09/23/2023)   Overall Financial Resource Strain (CARDIA)    Difficulty of Paying Living Expenses: Not hard at all  Food Insecurity: No Food Insecurity (01/26/2024)   Hunger Vital Sign    Worried About Running Out of Food in the Last Year: Never true    Ran Out of Food in the Last Year: Never true  Transportation Needs: No Transportation Needs (01/26/2024)   PRAPARE - Administrator, Civil Service (Medical): No    Lack of Transportation (Non-Medical): No  Physical Activity: Inactive (09/23/2023)   Exercise Vital Sign    Days of Exercise per Week: 0 days    Minutes of Exercise per Session: 0 min  Stress: No Stress Concern Present (09/23/2023)   [employer] of Occupational Health - Occupational Stress Questionnaire    Feeling of Stress : Only a little  Social Connections: Patient Declined (01/26/2024)   Social Connection and Isolation Panel [NHANES]     Frequency of Communication with Friends and Family: Patient declined    Frequency of Social Gatherings with Friends and Family: Patient declined    Attends Religious Services: Patient declined    Active Member of Clubs or Organizations: Patient declined    Attends Banker Meetings: Patient declined    Marital Status: Patient declined  Intimate Partner Violence: Not At Risk (01/26/2024)   Humiliation, Afraid, Rape, and Kick questionnaire    Fear of Current or Ex-Partner: No    Emotionally Abused: No    Physically Abused: No    Sexually Abused: No    Family History  Problem Relation Age of Onset   Colon cancer Mother 35   Prostate cancer Father        dx 65s   Cancer Sister    Acute myelogenous leukemia Sister        dx 35s   Prostate cancer Brother    Melanoma Brother    Prostate cancer Brother    Melanoma Brother    HIV/AIDS Brother 2   Tuberculosis Paternal Aunt    Melanoma Other    Breast cancer Other    Melanoma Niece      Vitals:   01/26/24 2042 01/27/24 0014 01/27/24 0422 01/27/24 0829  BP: (!) 175/73 (!) 146/72 (!) 163/84 (!) 170/80  Pulse: 92 76 83 81  Resp: 16 17 18 17   Temp: 99.2 F (37.3 C) 98.1 F (36.7 C) 98.5 F (36.9 C) 98.3 F (36.8 C)  TempSrc: Oral  Oral   SpO2: 98% 99% 100% 93%  Weight:      Height:        PHYSICAL EXAM General: well appearing elderly female, well nourished, in no acute distress. HEENT: Normocephalic and atraumatic.  Neck: No JVD.  Lungs: Normal respiratory effort on room air. Clear bilaterally to auscultation. No wheezes, crackles, rhonchi.  Heart: HRRR. Normal S1 and S2 without gallops or murmurs.  Abdomen: Non-distended appearing.  Msk: Normal strength and tone for age. Extremities: Warm and well perfused. No clubbing, cyanosis. No edema.  Neuro: Alert and oriented X 3. Psych: Answers questions appropriately.   Labs: Basic Metabolic Panel: Recent Labs    01/26/24 0414 01/27/24 0618  NA 140 139  K 4.0  3.7  CL 104 108  CO2 25 25  GLUCOSE 89 89  BUN 28* 23  CREATININE 1.05* 0.93  CALCIUM  8.3* 8.4*   Liver Function Tests: Recent Labs    01/25/24 0112  AST 21  ALT 11  ALKPHOS 60  BILITOT 0.6  PROT 6.3*  ALBUMIN 3.7   No results for input(s): "LIPASE", "AMYLASE" in the last 72 hours. CBC: Recent Labs    01/25/24 0112 01/26/24 0414 01/27/24 0618  WBC 10.9* 18.4* 14.4*  NEUTROABS 8.5*  --   --   HGB 11.3* 10.0* 9.5*  HCT 35.5* 32.7* 30.0*  MCV 91.0 95.3 92.0  PLT 166 154 126*   Cardiac Enzymes: Recent Labs    01/25/24 0112 01/25/24 0317  TROPONINIHS 27* 433*   BNP: Recent Labs    01/25/24 0112  BNP 522.6*   D-Dimer: No results for input(s): "DDIMER" in the last 72 hours. Hemoglobin A1C: No results for input(s): "HGBA1C" in the last 72 hours. Fasting Lipid Panel: No results for input(s): "CHOL", "HDL", "LDLCALC", "TRIG", "CHOLHDL", "LDLDIRECT" in the last 72 hours. Thyroid  Function Tests: No results for input(s): "TSH", "T4TOTAL", "T3FREE", "THYROIDAB" in the last 72 hours.  Invalid input(s): "FREET3" Anemia Panel: No results for input(s): "VITAMINB12", "FOLATE", "FERRITIN", "TIBC", "IRON", "RETICCTPCT" in the last 72 hours.   Radiology: ECHOCARDIOGRAM COMPLETE Result Date: 01/26/2024    ECHOCARDIOGRAM REPORT   Patient Name:   Sarah Riddle Date of Exam: 01/25/2024 Medical Rec #:  [id number]        Height:       71.0 in Accession #:    [id number]       Weight:       207.9 lb Date of Birth:  1941/03/19       BSA:          2.144 m Patient Age:    82 years         BP:           120/56 mmHg Patient Gender: F                HR:           81 bpm. Exam Location:  [facility] Procedure: 2D Echo, Cardiac Doppler and Color Doppler (Both Spectral and Color            Flow Doppler were utilized during procedure). Indications:     I21.4 NSTEMI  History:         Patient has prior history of Echocardiogram examinations, most                  recent 02/04/2023. Mitral Valve Prolapse,  Arrythmias:Atrial                  Fibrillation, Signs/Symptoms:Murmur; Risk Factors:Dyslipidemia                  and Hypertension. Obstructive sleep apnea.  Sonographer:     Brigid Canada RDCS Referring Phys:  [id number] Jaeleen Inzunza  Diagnosing Phys: Dwayne D Callwood MD IMPRESSIONS  1. Left ventricular ejection fraction, by estimation, is 60 to 65%. The left ventricle has moderate to severely decreased function. The left ventricle has no regional wall motion abnormalities. Left ventricular diastolic parameters are consistent with Grade III diastolic dysfunction (restrictive).  2. Right ventricular systolic function is normal. The right ventricular size is normal.  3. The mitral valve is normal in structure. Mild mitral valve regurgitation.  4. The aortic valve is normal in structure. Aortic valve regurgitation is not visualized. FINDINGS  Left Ventricle: Left ventricular ejection fraction, by estimation, is 60 to 65%. The left ventricle has moderate to severely decreased function. The left ventricle has no regional wall motion abnormalities. Strain was performed and the global longitudinal strain is indeterminate. The left ventricular internal cavity size was normal in size. There is no left ventricular hypertrophy. Left ventricular diastolic parameters are consistent with Grade III diastolic dysfunction (restrictive). Right Ventricle: The right ventricular size is normal. No increase in right ventricular wall thickness. Right ventricular systolic function is normal. Left Atrium: Left atrial size was normal in size. Right Atrium: Right atrial size was normal in size. Pericardium: There is no evidence of pericardial effusion. Mitral Valve: The mitral valve is normal in structure. Mild mitral valve regurgitation. Tricuspid Valve: The tricuspid valve is normal in structure. Tricuspid valve regurgitation is trivial. Aortic Valve: The aortic valve is normal in structure. Aortic valve regurgitation is not  visualized. Pulmonic Valve: The pulmonic valve was normal in structure. Pulmonic valve regurgitation is not visualized. Aorta: The ascending aorta was not well visualized. IAS/Shunts: No atrial level shunt detected by color flow Doppler. Additional Comments: 3D was performed not requiring image post processing on an independent workstation and was indeterminate.  LEFT VENTRICLE PLAX 2D LVIDd:         5.50 cm   Diastology LVIDs:         3.50 cm   LV e' medial:    8.09 cm/s LV PW:         0.90 cm   LV E/e' medial:  17.1 LV IVS:        0.70 cm   LV e' lateral:   9.97 cm/s LVOT diam:     2.30 cm   LV E/e' lateral: 13.9 LV SV:         92 LV SV Index:   43 LVOT Area:     4.15 cm  RIGHT VENTRICLE             IVC RV Basal diam:  5.10 cm     IVC diam: 2.40 cm RV S prime:     17.03 cm/s TAPSE (M-mode): 1.9 cm LEFT ATRIUM           Index        RIGHT ATRIUM           Index LA diam:      5.00 cm 2.33 cm/m   RA Area:     23.60 cm LA Vol (A2C): 61.2 ml 28.55 ml/m  RA Volume:   86.60 ml  40.40 ml/m LA Vol (A4C): 52.3 ml 24.40 ml/m  AORTIC VALVE LVOT Vmax:   114.67 cm/s LVOT Vmean:  78.333 cm/s LVOT VTI:    0.221 m  AORTA Ao Root diam: 3.40 cm Ao Asc diam:  3.70 cm MITRAL VALVE                TRICUSPID VALVE MV Area (PHT): 5.46 cm  TR Peak grad:   35.3 mmHg MV Decel Time: 139 msec     TR Vmax:        297.00 cm/s MV E velocity: 138.50 cm/s MV A velocity: 55.10 cm/s   SHUNTS MV E/A ratio:  2.51         Systemic VTI:  0.22 m                             Systemic Diam: 2.30 cm Antonette Batters MD Electronically signed by Antonette Batters MD Signature Date/Time: 01/26/2024/2:12:35 PM    Final    DG Chest Port 1 View Result Date: 01/25/2024 CLINICAL DATA:  Shortness of breath and cough. EXAM: PORTABLE CHEST 1 VIEW COMPARISON:  January 09, 2023 FINDINGS: The heart size and mediastinal contours are within normal limits. Mild prominence of the central pulmonary vasculature is noted. There is marked severity calcification of the  aortic arch. Mild diffusely increased interstitial lung markings are seen. Moderate severity superimposed left upper lobe infiltrate is noted. Mild superimposed right upper lobe and bibasilar infiltrates are also suspected. No pleural effusion or pneumothorax is identified. Postoperative changes are seen involving the right humerus. Multilevel degenerative changes seen throughout the thoracic spine. IMPRESSION: 1. Mild pulmonary vascular congestion with mild interstitial edema. 2. Suspected superimposed bilateral upper lobe and bibasilar infiltrates, left greater than right. Further evaluation with chest CT is recommended. Electronically Signed   By: Virgle Grime M.D.   On: 01/25/2024 01:38   LHC:  11/01/2022 (Dr. Meredeth Stallion) 1st Mrg lesion is 30% stenosed. LV end diastolic pressure is normal. The left ventricular ejection fraction is 50-55% by visual estimate.  ECHO as above  TELEMETRY reviewed by me 01/27/2024: sinus rhythm, rate 80s  EKG reviewed by me: Sinus rhythm, rate 113 bpm   Data reviewed by me 01/27/2024: last 24h vitals tele labs imaging I/O hospitalist progress note  Principal Problem:   Sepsis due to pneumonia [facility]) Active Problems:   OSA on CPAP   PAF with history of ablation (paroxysmal atrial fibrillation) (HCC)   Chronic pain   Chronic anticoagulation (Coumadin )   Acute respiratory failure with hypoxia (HCC)   Essential hypertension, benign   NSTEMI (non-ST elevated myocardial infarction) (HCC)   CAD (coronary artery disease)    ASSESSMENT AND PLAN:  CLOTIEL TROOP is a 83 y.o. female  with a past medical history of coronary artery disease, paroxysmal atrial fibrillation s/p ablation 2019 (on Eliquis ), OSA on CPAP, hypertension, hx of melonoma, chronic pain who presented to the ED on 01/25/2024 for cough, congestion, fever, and SOB. Admitted due to sepsis secondary to pneumonia. During ED work up found to have elevated troponins. Cardiology was consulted for further  evaluation.   # Sepsis, secondary to pneumonia # Acute hypoxic respiratory failure # NSTEMI type 1 vs type 2 # Paroxysmal atrial fibrillation s/p ablation (2019) # Hypertension  Patient presented to the ED due to 1 day of cough, congestion, fever and shortness of breath. Code sepsis called in ED in setting of pneumonia. CXR revealed pulmonary vascular congestion with bilateral infiltrates. BNP elevated 522. Trops trending 27 > 433. Patient had recent LHC in 10/2022 with no significant coronary artery disease. EKG in ED with Sinus rhythm, rate 113 bpm. This AM BP is borderline with stable HR. Per tele patient had remained in sinus rhythm. Patient denies any chest pain. Echo this admission with preserved EF, grade III diastolic dysfunction.  -Elevated  troponins in the setting of sepsis secondary to pneumonia and acute hypoxic respiratory failure most consistent with demand/supply mismatch and not ACS. -DC heparin  this AM. Resume eliquis  5 mg twice daily. -Continue ASA 81 mg, rosuvastatin  40 mg daily. -Continue home lasix  20 mg prn. Closely monitor renal function. -Continue metoprolol  succinate 25 mg daily. -Defer resuming home imdur  as no significant CAD per LHC done in 10/2022. -Increase losartan  to 100 mg daily.  -Consider resuming home hydralazine  outpatient. -Sepsis, PNA management per primary.  Ok for discharge today from a cardiac perspective. Will arrange for follow up in clinic with Dr. Meredeth Stallion in 1-2 weeks.    This patient's plan of care was discussed and created with Dr. Beau Bound and he is in agreement.  Signed: Hamp Levine, PA-C  01/27/2024, 9:32 AM [facility] Cardiology

## 2024-01-27 NOTE — Progress Notes (Signed)
 PHARMACY - ANTICOAGULATION CONSULT NOTE  Pharmacy Consult for Heparin   Indication: chest pain/ACS  Allergies  Allergen Reactions   Iodinated Contrast Media Shortness Of Breath and Other (See Comments)    Can be premedicated  Have been able to tolerate if given premeds (benadryl , prednisone )   Ibuprofen Other (See Comments)    "stomach problems"    Sulfa Antibiotics Other (See Comments)    "Stomach problems"  "Stomach problems"  "Stomach problems"  "Stomach problems"   Doxycycline Diarrhea and Nausea And Vomiting   Tetracyclines & Related Nausea Only and Nausea And Vomiting    Patient Measurements: Height: 5\' 11"  (180.3 cm) Weight: 94.3 kg (207 lb 14.3 oz) IBW/kg (Calculated) : 70.8 HEPARIN  DW (KG): 90.2  Vital Signs: Temp: 98.5 F (36.9 C) (04/25 0422) Temp Source: Oral (04/25 0422) BP: 163/84 (04/25 0422) Pulse Rate: 83 (04/25 0422)  Labs: Recent Labs    01/25/24 0112 01/25/24 0317 01/25/24 0440 01/25/24 1249 01/26/24 0414 01/26/24 1329 01/26/24 2321 01/27/24 0618  HGB 11.3*  --   --   --  10.0*  --   --  9.5*  HCT 35.5*  --   --   --  32.7*  --   --  30.0*  PLT 166  --   --   --  154  --   --  126*  APTT  --   --  35   < >  --  44* 83* 73*  LABPROT  --   --  16.2*  --   --   --   --   --   INR  --   --  1.3*  --   --   --   --   --   HEPARINUNFRC  --   --  >1.10*  --   --  1.01*  --  0.48  CREATININE 0.90  --   --   --  1.05*  --   --  0.93  TROPONINIHS 27* 433*  --   --   --   --   --   --    < > = values in this interval not displayed.    Estimated Creatinine Clearance: 58 mL/min (by C-G formula based on SCr of 0.93 mg/dL).   Medical History: Past Medical History:  Diagnosis Date   A-fib [facility])    Accelerated hypertension 10/31/2022   Age related osteoporosis    Cancer (HCC)    Melanoma x 2 -- Lt Leg -1976 Rt Arm - 2014   Chronic bilateral low back pain    Chronic kidney disease    Complication of anesthesia    DVT (deep venous thrombosis)  (HCC) 1960   right leg  was on BC pills   Dysrhythmia    Family history of breast cancer    Family history of colon cancer    Family history of melanoma    Family history of prostate cancer    Fibrocystic breast disease    GERD (gastroesophageal reflux disease)    Headache    migraine   Heart murmur    not heard now.   History of kidney stones    Hyperlipidemia    Hypertension    IBS (irritable bowel syndrome)    Lumbago    Mitral valve prolapse    Neuropathy    Obstructive sleep apnea    Osteoarthritis of shoulder    PONV (postoperative nausea and vomiting)     Medications:  Medications Prior to  Admission  Medication Sig Dispense Refill Last Dose/Taking   acetaminophen  (TYLENOL ) 500 MG tablet Take 500 mg by mouth every 6 (six) hours as needed for moderate pain (pain score 4-6) or mild pain (pain score 1-3). Once a day   Past Week   alendronate  (FOSAMAX ) 70 MG tablet Take 1 tablet (70 mg total) by mouth every Thursday. Take with a full glass of water on an empty stomach. (Patient taking differently: Take 70 mg by mouth every Tuesday. Take with a full glass of water on an empty stomach.) 12 tablet 2 01/24/2024 at  8:00 AM   allopurinol  (ZYLOPRIM ) 100 MG tablet Take 1 tablet (100 mg total) by mouth daily. 90 tablet 1 01/24/2024 at  8:00 AM   apixaban  (ELIQUIS ) 5 MG TABS tablet Take 1 tablet (5 mg total) by mouth 2 (two) times daily. 60 tablet 0 01/24/2024 at  9:00 PM   azelastine  (ASTELIN ) 0.1 % nasal spray Place 2 sprays into both nostrils at bedtime. Use in each nostril as directed (Patient taking differently: Place 2 sprays into both nostrils daily as needed for allergies or rhinitis. Use in each nostril as directed) 30 mL 12 Unknown   cyanocobalamin  (VITAMIN B12) 1000 MCG tablet Take 1,000 mcg by mouth daily. Monday to Friday   01/24/2024 at  8:00 AM   EPINEPHrine  0.3 mg/0.3 mL IJ SOAJ injection Inject 0.3 mg into the muscle as needed for anaphylaxis.   Unknown   fluticasone   (FLONASE ) 50 MCG/ACT nasal spray Place 2 sprays into both nostrils daily as needed for allergies or rhinitis.   Unknown   furosemide  (LASIX ) 20 MG tablet Take 1 tablet (20 mg total) by mouth 2 (two) times daily. (Patient taking differently: Take 20 mg by mouth 2 (two) times daily as needed for fluid or edema.) 60 tablet 0 Past Week   gabapentin  (NEURONTIN ) 800 MG tablet TAKE 1 TABLET FOUR TIMES DAILY 360 tablet 3 01/24/2024 at  9:00 PM   hydrALAZINE  (APRESOLINE ) 25 MG tablet TAKE 1 TABLET TWICE DAILY 180 tablet 3 01/24/2024 at  9:00 PM   isosorbide  mononitrate (IMDUR ) 60 MG 24 hr tablet TAKE 1 TABLET EVERY DAY (Patient taking differently: Take 60 mg by mouth at bedtime.) 90 tablet 3 01/24/2024 at  9:00 PM   levocetirizine (XYZAL ) 5 MG tablet TAKE 1 TABLET EVERY EVENING 90 tablet 3 01/24/2024 at  9:00 PM   losartan  (COZAAR ) 25 MG tablet TAKE 1 TABLET EVERY DAY 90 tablet 3 01/24/2024 at  9:00 PM   meclizine  (ANTIVERT ) 12.5 MG tablet Take 1 tablet (12.5 mg total) by mouth 3 (three) times daily as needed for dizziness. 30 tablet 0 Past Week   metoprolol  succinate (TOPROL -XL) 25 MG 24 hr tablet TAKE 1 TABLET EVERY DAY (Patient taking differently: Take 25 mg by mouth at bedtime.) 90 tablet 3 01/24/2024 at  9:00 PM   nitroGLYCERIN  (NITROSTAT ) 0.4 MG SL tablet Place 0.4 mg under the tongue every 5 (five) minutes as needed for chest pain.   Unknown   pantoprazole  (PROTONIX ) 40 MG tablet TAKE 1 TABLET EVERY DAY 90 tablet 3 01/24/2024 at  8:00 AM   Polyethyl Glyc-Propyl Glyc PF (SYSTANE HYDRATION PF) 0.4-0.3 % SOLN Apply 1 drop to eye daily as needed (dry eyes).   Unknown   rosuvastatin  (CRESTOR ) 40 MG tablet TAKE 1 TABLET EVERY DAY (Patient taking differently: Take 40 mg by mouth at bedtime.) 90 tablet 3 01/24/2024 at  9:00 PM   senna-docusate (SENOKOT-S) 8.6-50 MG tablet Take 1  tablet by mouth at bedtime as needed for mild constipation. 90 tablet 0 Unknown   sertraline  (ZOLOFT ) 25 MG tablet TAKE 1 TABLET THREE TIMES  DAILY 180 tablet 5 01/24/2024 at  9:00 PM   Vitamin D , Ergocalciferol , 50000 units CAPS Take 1 capsule by mouth once a week. 12 capsule 3 01/20/2024    Assessment: Pharmacy consulted to dose heparin  in this 83 year old female admitted with NSTEMI.  Pt was on Eliquis  5 mg PO BID PTA, last dose on 4/22 PM. CrCl = 60 ml/min   Goal of Therapy:  Heparin  level 0.3-0.7 units/ml aPTT 66 - 102 seconds Monitor platelets by anticoagulation protocol: Yes    4/23 1249 aPTT > 200 sec, supratherapeutic @ 1250 u/hr 4/24 2344 aPTT 180 sec, supratherapteuic @ 1050 u/hr 4/24 1329 aPTT 44 sec/HL 1.01, aPTT subtherapeutic @ 750 u/hr 4/24 2321 aPTT 83 sec, therapeutic X 1 4/25 0618 aPTT 72, HL 0.48, therapeutic X 2, correlation  Plan:  Continue heparin  infusion at 1000 units/hr Titrate by heparin  level moving forward now that there is correlation with aPTT Next heparin  level check with tomorrow AM labs Daily HL and CBC while on heparin   Will M. Alva Jewels, PharmD Clinical Pharmacist 01/27/2024 7:21 AM

## 2024-01-28 LAB — CULTURE, BLOOD (ROUTINE X 2)

## 2024-01-30 ENCOUNTER — Telehealth: Payer: Self-pay

## 2024-01-30 DIAGNOSIS — G4733 Obstructive sleep apnea (adult) (pediatric): Secondary | ICD-10-CM | POA: Diagnosis not present

## 2024-01-30 NOTE — Transitions of Care (Post Inpatient/ED Visit) (Signed)
 01/30/2024  Name: Sarah Riddle MRN: [id number] DOB: Dec 31, 1940  Today's TOC FU Call Status: Today's TOC FU Call Status:: Successful TOC FU Call Completed TOC FU Call Complete Date: 01/30/24 Patient's Name and Date of Birth confirmed.  Transition Care Management Follow-up Telephone Call Date of Discharge: 01/27/24 Discharge Facility: [facility]) Type of Discharge: Inpatient Admission Primary Inpatient Discharge Diagnosis:: Pneumonia How have you been since you were released from the hospital?: Better Any questions or concerns?: No  Items Reviewed: Did you receive and understand the discharge instructions provided?: Yes Medications obtained,verified, and reconciled?: Yes (Medications Reviewed) Any new allergies since your discharge?: No Dietary orders reviewed?: Yes Type of Diet Ordered:: Low Sodium Heart Healthy Do you have support at home?: Yes People in Home [RPT]: child(ren), adult, spouse Name of Support/Comfort Primary Source: [name]  Medications Reviewed Today: Medications Reviewed Today     Reviewed by Claudene Crystal, RN (Case Manager) on 01/30/24 at 1040  Med List Status: <None>   Medication Order Taking? Sig Documenting Provider Last Dose Status Informant  acetaminophen  (TYLENOL ) 500 MG tablet [id number]  Take 500 mg by mouth every 6 (six) hours as needed for moderate pain (pain score 4-6) or mild pain (pain score 1-3). Once a day [provider]  Active Self, Pharmacy Records  alendronate  (FOSAMAX ) 70 MG tablet [id number]  Take 1 tablet (70 mg total) by mouth every Thursday. Take with a full glass of water on an empty stomach.  Patient taking differently: Take 70 mg by mouth every Tuesday. Take with a full glass of water on an empty stomach.   Aisha Hove, MD  Active Self, Pharmacy Records  allopurinol  (ZYLOPRIM ) 100 MG tablet [id number]  Take 1 tablet (100 mg total) by mouth daily. Aisha Hove, MD  Active Self, Pharmacy  Records  apixaban  (ELIQUIS ) 5 MG TABS tablet [id number]  Take 1 tablet (5 mg total) by mouth 2 (two) times daily. Aisha Hove, MD  Active Self, Pharmacy Records  aspirin  EC 81 MG tablet [id number]  Take 1 tablet (81 mg total) by mouth daily. Swallow whole. Ree Candy, MD  Active   azelastine  (ASTELIN ) 0.1 % nasal spray [id number]  Place 2 sprays into both nostrils at bedtime. Use in each nostril as directed  Patient taking differently: Place 2 sprays into both nostrils daily as needed for allergies or rhinitis. Use in each nostril as directed   Aisha Hove, MD  Active Self, Pharmacy Records  cephALEXin  (KEFLEX ) 500 MG capsule [id number]  Take 1 capsule (500 mg total) by mouth 2 (two) times daily for 7 days. Ree Candy, MD  Active   cyanocobalamin  (VITAMIN B12) 1000 MCG tablet [id number]  Take 1,000 mcg by mouth daily. Monday to Friday [provider]  Active Self, Pharmacy Records  EPINEPHrine  0.3 mg/0.3 mL IJ SOAJ injection [id number]  Inject 0.3 mg into the muscle as needed for anaphylaxis. [provider]  Active Self, Pharmacy Records  fluticasone  (FLONASE ) 50 MCG/ACT nasal spray [id number]  Place 2 sprays into both nostrils daily as needed for allergies or rhinitis. [provider]  Active Pharmacy Records, Self  furosemide  (LASIX ) 20 MG tablet [id number]  Take 1 tablet (20 mg total) by mouth 2 (two) times daily.  Patient taking differently: Take 20 mg by mouth 2 (two) times daily as needed for fluid or edema.   Alica Antu, NP  Active Self, Pharmacy Records  gabapentin  (NEURONTIN ) 800 MG tablet [id number]  TAKE 1  TABLET FOUR TIMES DAILY Trenda Frisk, FNP  Active Self, Pharmacy Records  levocetirizine (XYZAL ) 5 MG tablet [id number]  TAKE 1 TABLET EVERY EVENING Aisha Hove, MD  Active Self, Pharmacy Records  losartan  (COZAAR ) 100 MG tablet [id number]  Take 1 tablet (100 mg total) by mouth daily. Ree Candy, MD  Active   meclizine   (ANTIVERT ) 12.5 MG tablet [id number]  Take 1 tablet (12.5 mg total) by mouth 3 (three) times daily as needed for dizziness. Glendale Landmark, NP  Active Self, Pharmacy Records  metoprolol  succinate (TOPROL -XL) 25 MG 24 hr tablet [id number]  TAKE 1 TABLET EVERY DAY  Patient taking differently: Take 25 mg by mouth at bedtime.   Cherrie Cornwall, MD  Active Self, Pharmacy Records  nitroGLYCERIN  (NITROSTAT ) 0.4 MG SL tablet [id number]  Place 0.4 mg under the tongue every 5 (five) minutes as needed for chest pain. [provider]  Active Pharmacy Records, Self  pantoprazole  (PROTONIX ) 40 MG tablet [id number]  TAKE 1 TABLET EVERY DAY Cherrie Cornwall, MD  Active Self, Pharmacy Records  Polyethyl Glyc-Propyl Glyc PF (SYSTANE HYDRATION PF) 0.4-0.3 % SOLN [id number]  Apply 1 drop to eye daily as needed (dry eyes). [provider]  Active Self, Pharmacy Records  rosuvastatin  (CRESTOR ) 40 MG tablet [id number]  TAKE 1 TABLET EVERY DAY  Patient taking differently: Take 40 mg by mouth at bedtime.   Cherrie Cornwall, MD  Active Self, Pharmacy Records  sertraline  (ZOLOFT ) 25 MG tablet [id number]  TAKE 1 TABLET THREE TIMES DAILY Aisha Hove, MD  Active Self, Pharmacy Records  Vitamin D , Ergocalciferol , 50000 units CAPS [id number]  Take 1 capsule by mouth once a week. Aisha Hove, MD  Active Self, Pharmacy Records  Med List Note Isidore Mares, CPhT 05/03/22 1307): Pt has CPAP Machine that she uses with 2L of oxygen              Home Care and Equipment/Supplies: Were Home Health Services Ordered?: NA Any new equipment or medical supplies ordered?: NA  Functional Questionnaire: Do you need assistance with bathing/showering or dressing?: No Do you need assistance with meal preparation?: No Do you need assistance with eating?: No Do you have difficulty maintaining continence: No Do you need assistance with getting out of bed/getting out of a chair/moving?: No Do you have difficulty managing  or taking your medications?: No  Follow up appointments reviewed: PCP Follow-up appointment confirmed?: Yes Date of PCP follow-up appointment?: 02/03/24 Follow-up Provider: [facility] Follow-up appointment confirmed?: Yes Date of Specialist follow-up appointment?: 02/02/24 Follow-Up Specialty Provider:: [place] [facility] Do you need transportation to your follow-up appointment?: No Do you understand care options if your condition(s) worsen?: Yes-patient verbalized understanding  SDOH Interventions Today    Flowsheet Row Most Recent Value  SDOH Interventions   Food Insecurity Interventions Intervention Not Indicated  Housing Interventions Intervention Not Indicated  Transportation Interventions Intervention Not Indicated  Utilities Interventions Intervention Not Indicated       Gareld June, BSN, RN [place]  VBCI - Population Health RN Care Manager [phone]

## 2024-01-30 NOTE — Transitions of Care (Post Inpatient/ED Visit) (Signed)
   01/30/2024  Name: SREENIKA DICOLA MRN: [id number] DOB: 1941-06-18  Today's TOC FU Call Status: Today's TOC FU Call Status:: Unsuccessful Call (1st Attempt) Unsuccessful Call (1st Attempt) Date: 01/30/24  Attempted to reach the patient regarding the most recent Inpatient/ED visit.  Follow Up Plan: Additional outreach attempts will be made to reach the patient to complete the Transitions of Care (Post Inpatient/ED visit) call.   Gareld June, BSN, RN [place]  VBCI - [employer] [facility] RN Care Manager [phone]

## 2024-02-02 ENCOUNTER — Ambulatory Visit: Admitting: Cardiovascular Disease

## 2024-02-02 ENCOUNTER — Encounter: Payer: Self-pay | Admitting: Cardiovascular Disease

## 2024-02-02 VITALS — BP 152/72 | HR 74 | Ht 71.0 in | Wt 196.0 lb

## 2024-02-02 DIAGNOSIS — R0602 Shortness of breath: Secondary | ICD-10-CM | POA: Diagnosis not present

## 2024-02-02 DIAGNOSIS — I25112 Atherosclerotic heart disease of native coronary artery with refractory angina pectoris: Secondary | ICD-10-CM

## 2024-02-02 DIAGNOSIS — E782 Mixed hyperlipidemia: Secondary | ICD-10-CM

## 2024-02-02 DIAGNOSIS — R0789 Other chest pain: Secondary | ICD-10-CM | POA: Diagnosis not present

## 2024-02-02 DIAGNOSIS — I10 Essential (primary) hypertension: Secondary | ICD-10-CM

## 2024-02-02 NOTE — Progress Notes (Signed)
 Cardiology Office Note   Date:  02/02/2024   ID:  [name], [name] [birth year], MRN [id number]  PCP:  Aisha Hove, MD  Cardiologist:  Debborah Fairly, MD      History of Present Illness: Sarah Riddle is a 83 y.o. female who presents for  Chief Complaint  Patient presents with   Hospitalization Follow-up    Hospital Follow Up    Admitted to [facility] and pneumonia/spsis with elevated troponins. Has occasional chest pain left precordial, intermittantly and SOB.  Chest Pain  This is a new problem. The current episode started 1 to 4 weeks ago. The onset quality is sudden. The problem has been waxing and waning. The pain is present in the lateral region. The pain is at a severity of 6/10.      Past Medical History:  Diagnosis Date   A-fib [facility])    Accelerated hypertension 10/31/2022   Age related osteoporosis    Cancer (HCC)    Melanoma x 2 -- Lt Leg -1976 Rt Arm - 2014   Chronic bilateral low back pain    Chronic kidney disease    Complication of anesthesia    DVT (deep venous thrombosis) (HCC) 1960   right leg  was on BC pills   Dysrhythmia    Family history of breast cancer    Family history of colon cancer    Family history of melanoma    Family history of prostate cancer    Fibrocystic breast disease    GERD (gastroesophageal reflux disease)    Headache    migraine   Heart murmur    not heard now.   History of kidney stones    Hyperlipidemia    Hypertension    IBS (irritable bowel syndrome)    Lumbago    Mitral valve prolapse    Neuropathy    Obstructive sleep apnea    Osteoarthritis of shoulder    PONV (postoperative nausea and vomiting)      Past Surgical History:  Procedure Laterality Date   ABDOMINAL HYSTERECTOMY     BACK SURGERY     BREAST BIOPSY Left 04/20/2023   us  bx/ heart clip/ path pending   BREAST BIOPSY Left 04/20/2023   US  LT BREAST BX W LOC DEV 1ST LESION IMG BX SPEC US  GUIDE 04/20/2023 [facility]   BREAST CYST ASPIRATION  Left 2001   Negative   CARDIAC ELECTROPHYSIOLOGY MAPPING AND ABLATION     EYE SURGERY     HUMERUS IM NAIL Right 05/05/2022   Procedure: INTRAMEDULLARY (IM) NAIL HUMERAL;  Surgeon: Laneta Pintos, MD;  Location: [facility] OR;  Service: Orthopedics;  Laterality: Right;   HYSTEROTOMY     L4-L5  2020   titaum rebok cage   LEFT HEART CATH AND CORONARY ANGIOGRAPHY N/A 11/01/2022   Procedure: LEFT HEART CATH AND CORONARY ANGIOGRAPHY and possible PCI and stent;  Surgeon: Cherrie Cornwall, MD;  Location: [facility] INVASIVE CV LAB;  Service: Cardiovascular;  Laterality: N/A;   TOTAL HIP ARTHROPLASTY Left      Current Outpatient Medications  Medication Sig Dispense Refill   acetaminophen  (TYLENOL ) 500 MG tablet Take 500 mg by mouth every 6 (six) hours as needed for moderate pain (pain score 4-6) or mild pain (pain score 1-3). Once a day     alendronate  (FOSAMAX ) 70 MG tablet Take 1 tablet (70 mg total) by mouth every Thursday. Take with a full glass of water on an empty stomach. (Patient taking differently: Take  70 mg by mouth every Tuesday. Take with a full glass of water on an empty stomach.) 12 tablet 2   allopurinol  (ZYLOPRIM ) 100 MG tablet Take 1 tablet (100 mg total) by mouth daily. 90 tablet 1   apixaban  (ELIQUIS ) 5 MG TABS tablet Take 1 tablet (5 mg total) by mouth 2 (two) times daily. 60 tablet 0   aspirin  EC 81 MG tablet Take 1 tablet (81 mg total) by mouth daily. Swallow whole. 30 tablet 0   azelastine  (ASTELIN ) 0.1 % nasal spray Place 2 sprays into both nostrils at bedtime. Use in each nostril as directed (Patient taking differently: Place 2 sprays into both nostrils daily as needed for allergies or rhinitis. Use in each nostril as directed) 30 mL 12   cephALEXin  (KEFLEX ) 500 MG capsule Take 1 capsule (500 mg total) by mouth 2 (two) times daily for 7 days. 14 capsule 0   cyanocobalamin  (VITAMIN B12) 1000 MCG tablet Take 1,000 mcg by mouth daily. Monday to Friday     EPINEPHrine  0.3 mg/0.3 mL IJ SOAJ  injection Inject 0.3 mg into the muscle as needed for anaphylaxis.     fluticasone  (FLONASE ) 50 MCG/ACT nasal spray Place 2 sprays into both nostrils daily as needed for allergies or rhinitis.     furosemide  (LASIX ) 20 MG tablet Take 1 tablet (20 mg total) by mouth 2 (two) times daily. (Patient taking differently: Take 20 mg by mouth 2 (two) times daily as needed for fluid or edema.) 60 tablet 0   gabapentin  (NEURONTIN ) 800 MG tablet TAKE 1 TABLET FOUR TIMES DAILY 360 tablet 3   levocetirizine (XYZAL ) 5 MG tablet TAKE 1 TABLET EVERY EVENING 90 tablet 3   losartan  (COZAAR ) 100 MG tablet Take 1 tablet (100 mg total) by mouth daily. 60 tablet 0   meclizine  (ANTIVERT ) 12.5 MG tablet Take 1 tablet (12.5 mg total) by mouth 3 (three) times daily as needed for dizziness. 30 tablet 0   metoprolol  succinate (TOPROL -XL) 25 MG 24 hr tablet TAKE 1 TABLET EVERY DAY (Patient taking differently: Take 25 mg by mouth at bedtime.) 90 tablet 3   nitroGLYCERIN  (NITROSTAT ) 0.4 MG SL tablet Place 0.4 mg under the tongue every 5 (five) minutes as needed for chest pain.     OXYGEN  Inhale 2 L/min into the lungs at bedtime. And as needed for SOB     pantoprazole  (PROTONIX ) 40 MG tablet TAKE 1 TABLET EVERY DAY 90 tablet 3   Polyethyl Glyc-Propyl Glyc PF (SYSTANE HYDRATION PF) 0.4-0.3 % SOLN Apply 1 drop to eye daily as needed (dry eyes).     rosuvastatin  (CRESTOR ) 40 MG tablet TAKE 1 TABLET EVERY DAY (Patient taking differently: Take 40 mg by mouth at bedtime.) 90 tablet 3   sertraline  (ZOLOFT ) 25 MG tablet TAKE 1 TABLET THREE TIMES DAILY 180 tablet 5   Vitamin D , Ergocalciferol , 50000 units CAPS Take 1 capsule by mouth once a week. 12 capsule 3   No current facility-administered medications for this visit.    Allergies:   Iodinated contrast media, Ibuprofen, Sulfa antibiotics, Doxycycline, and Tetracyclines & related    Social History:   reports that she has never smoked. She has never used smokeless tobacco. She reports  that she does not drink alcohol and does not use drugs.   Family History:  family history includes Acute myelogenous leukemia in her sister; Breast cancer in an other family member; Cancer in her sister; Colon cancer (age of onset: 19) in her mother; HIV/AIDS (age  of onset: 36) in her brother; Melanoma in her brother, brother, niece, and another family member; Prostate cancer in her brother, brother, and father; Tuberculosis in her paternal aunt.    ROS:     Review of Systems  Constitutional: Negative.   HENT: Negative.    Eyes: Negative.   Respiratory: Negative.    Cardiovascular:  Positive for chest pain.  Gastrointestinal: Negative.   Genitourinary: Negative.   Musculoskeletal: Negative.   Skin: Negative.   Neurological: Negative.   Endo/Heme/Allergies: Negative.   Psychiatric/Behavioral: Negative.    All other systems reviewed and are negative.     All other systems are reviewed and negative.    PHYSICAL EXAM: VS:  BP (!) 152/72   Pulse 74   Ht 5\' 11"  (1.803 m)   Wt 196 lb (88.9 kg)   SpO2 97%   BMI 27.34 kg/m  , BMI Body mass index is 27.34 kg/m. Last weight:  Wt Readings from Last 3 Encounters:  02/02/24 196 lb (88.9 kg)  01/30/24 207 lb (93.9 kg)  01/25/24 207 lb 14.3 oz (94.3 kg)     Physical Exam Constitutional:      Appearance: Normal appearance.  Cardiovascular:     Rate and Rhythm: Normal rate and regular rhythm.     Heart sounds: Normal heart sounds.  Pulmonary:     Effort: Pulmonary effort is normal.     Breath sounds: Normal breath sounds.  Musculoskeletal:     Right lower leg: No edema.     Left lower leg: No edema.  Neurological:     Mental Status: She is alert.       EKG:  Sinus tachycardia 113/min non specific st changes Recent Labs: 12/22/2023: TSH 0.827 01/25/2024: ALT 11; B Natriuretic Peptide 522.6 01/27/2024: BUN 23; Creatinine, Ser 0.93; Hemoglobin 9.5; Platelets 126; Potassium 3.7; Sodium 139    Lipid Panel    Component  Value Date/Time   CHOL 131 12/22/2023 1049   TRIG 112 12/22/2023 1049   HDL 58 12/22/2023 1049   CHOLHDL 2.3 12/22/2023 1049   LDLCALC 53 12/22/2023 1049      Other studies Reviewed: Additional studies/ records that were reviewed today include:  Review of the above records demonstrates:       No data to display            ASSESSMENT AND PLAN:    ICD-10-CM   1. Essential hypertension, benign  I10 MYOCARDIAL PERFUSION IMAGING    2. Mixed hyperlipidemia  E78.2 MYOCARDIAL PERFUSION IMAGING    3. Other chest pain  R07.89 MYOCARDIAL PERFUSION IMAGING   will set up stress tes, cath 1/24 negative. Troponins 433, but had pneumonia/spsis. Demand ischaemia. ECHO had LVEF 65%. Mild MR, trace tR.    4. SOB (shortness of breath)  R06.02 MYOCARDIAL PERFUSION IMAGING    5. Coronary artery disease involving native coronary artery of native heart with refractory angina pectoris (HCC)  I25.112 MYOCARDIAL PERFUSION IMAGING       Problem List Items Addressed This Visit       Cardiovascular and Mediastinum   Essential hypertension, benign - Primary   Relevant Orders   MYOCARDIAL PERFUSION IMAGING   CAD (coronary artery disease)   Relevant Orders   MYOCARDIAL PERFUSION IMAGING     Other   HLD (hyperlipidemia)   Relevant Orders   MYOCARDIAL PERFUSION IMAGING   Other Visit Diagnoses       Other chest pain       will set up  stress tes, cath 1/24 negative. Troponins 433, but had pneumonia/spsis. Demand ischaemia. ECHO had LVEF 65%. Mild MR, trace tR.   Relevant Orders   MYOCARDIAL PERFUSION IMAGING     SOB (shortness of breath)       Relevant Orders   MYOCARDIAL PERFUSION IMAGING          Disposition:   Return in about 2 weeks (around 02/16/2024) for stress test and f/u.    Total time spent: 30 minutes  Signed,  Debborah Fairly, MD  02/02/2024 9:52 AM    [facility]

## 2024-02-03 ENCOUNTER — Encounter: Payer: Self-pay | Admitting: Internal Medicine

## 2024-02-03 ENCOUNTER — Ambulatory Visit (INDEPENDENT_AMBULATORY_CARE_PROVIDER_SITE_OTHER): Admitting: Internal Medicine

## 2024-02-03 VITALS — BP 158/66 | HR 70 | Ht 71.0 in | Wt 199.0 lb

## 2024-02-03 DIAGNOSIS — E782 Mixed hyperlipidemia: Secondary | ICD-10-CM | POA: Diagnosis not present

## 2024-02-03 DIAGNOSIS — I10 Essential (primary) hypertension: Secondary | ICD-10-CM

## 2024-02-03 DIAGNOSIS — J189 Pneumonia, unspecified organism: Secondary | ICD-10-CM

## 2024-02-03 DIAGNOSIS — I25112 Atherosclerotic heart disease of native coronary artery with refractory angina pectoris: Secondary | ICD-10-CM

## 2024-02-03 DIAGNOSIS — D508 Other iron deficiency anemias: Secondary | ICD-10-CM | POA: Diagnosis not present

## 2024-02-03 MED ORDER — NITROGLYCERIN 0.4 MG SL SUBL: 0.4000 mg | SUBLINGUAL_TABLET | SUBLINGUAL | 0 refills | Status: AC | PRN

## 2024-02-03 NOTE — Progress Notes (Signed)
 Established Patient Office Visit  Subjective:  Patient ID: Sarah Riddle, female    DOB: 1941/03/22  Age: 83 y.o. MRN: [id number]  Chief Complaint  Patient presents with   Hospitalization Follow-up    Patient comes in for hospital follow-up accompanied by her daughter.  She was admitted to [facility] from 01/25/2024 until 01/27/2024 with diagnosis of sepsis, pneumonia, coronary artery disease.  She was treated with IV fluids and IV antibiotics.  She was discharged on oral antibiotics which she completed yesterday.  She continues to have a mildly diarrhea. She still has a mild dry cough but generally she is feeling better although still tired. Will check her labs today as her H&H was low. Will also need a follow-up chest x-ray for pneumonia in 1 week. Denies chest pain, no shortness of breath, no palpitations.  Her blood pressure is high today but her medications have been adjusted yesterday by the cardiologist.    No other concerns at this time.   Past Medical History:  Diagnosis Date   A-fib [facility])    Accelerated hypertension 10/31/2022   Age related osteoporosis    Cancer (HCC)    Melanoma x 2 -- Lt Leg -1976 Rt Arm - 2014   Chronic bilateral low back pain    Chronic kidney disease    Complication of anesthesia    DVT (deep venous thrombosis) (HCC) 1960   right leg  was on BC pills   Dysrhythmia    Family history of breast cancer    Family history of colon cancer    Family history of melanoma    Family history of prostate cancer    Fibrocystic breast disease    GERD (gastroesophageal reflux disease)    Headache    migraine   Heart murmur    not heard now.   History of kidney stones    Hyperlipidemia    Hypertension    IBS (irritable bowel syndrome)    Lumbago    Mitral valve prolapse    Neuropathy    Obstructive sleep apnea    Osteoarthritis of shoulder    PONV (postoperative nausea and vomiting)     Past Surgical History:  Procedure Laterality Date   ABDOMINAL  HYSTERECTOMY     BACK SURGERY     BREAST BIOPSY Left 04/20/2023   us  bx/ heart clip/ path pending   BREAST BIOPSY Left 04/20/2023   US  LT BREAST BX W LOC DEV 1ST LESION IMG BX SPEC US  GUIDE 04/20/2023 [facility]   BREAST CYST ASPIRATION Left 2001   Negative   CARDIAC ELECTROPHYSIOLOGY MAPPING AND ABLATION     EYE SURGERY     HUMERUS IM NAIL Right 05/05/2022   Procedure: INTRAMEDULLARY (IM) NAIL HUMERAL;  Surgeon: Laneta Pintos, MD;  Location: [facility] OR;  Service: Orthopedics;  Laterality: Right;   HYSTEROTOMY     L4-L5  2020   titaum rebok cage   LEFT HEART CATH AND CORONARY ANGIOGRAPHY N/A 11/01/2022   Procedure: LEFT HEART CATH AND CORONARY ANGIOGRAPHY and possible PCI and stent;  Surgeon: Cherrie Cornwall, MD;  Location: [facility] INVASIVE CV LAB;  Service: Cardiovascular;  Laterality: N/A;   TOTAL HIP ARTHROPLASTY Left     Social History   Socioeconomic History   Marital status: Married    Spouse name: Not on file   Number of children: Not on file   Years of education: Not on file   Highest education level: Not on file  Occupational History  Not on file  Tobacco Use   Smoking status: Never   Smokeless tobacco: Never  Vaping Use   Vaping status: Never Used  Substance and Sexual Activity   Alcohol use: No   Drug use: No   Sexual activity: Not Currently  Other Topics Concern   Not on file  Social History Narrative   Not on file   Social Drivers of Health   Financial Resource Strain: Low Risk  (09/23/2023)   Overall Financial Resource Strain (CARDIA)    Difficulty of Paying Living Expenses: Not hard at all  Food Insecurity: No Food Insecurity (01/30/2024)   Hunger Vital Sign    Worried About Running Out of Food in the Last Year: Never true    Ran Out of Food in the Last Year: Never true  Transportation Needs: No Transportation Needs (01/30/2024)   PRAPARE - Administrator, Civil Service (Medical): No    Lack of Transportation (Non-Medical): No  Physical  Activity: Inactive (09/23/2023)   Exercise Vital Sign    Days of Exercise per Week: 0 days    Minutes of Exercise per Session: 0 min  Stress: No Stress Concern Present (09/23/2023)   [employer] of Occupational Health - Occupational Stress Questionnaire    Feeling of Stress : Only a little  Social Connections: Patient Declined (01/26/2024)   Social Connection and Isolation Panel [NHANES]    Frequency of Communication with Friends and Family: Patient declined    Frequency of Social Gatherings with Friends and Family: Patient declined    Attends Religious Services: Patient declined    Database administrator or Organizations: Patient declined    Attends Banker Meetings: Patient declined    Marital Status: Patient declined  Intimate Partner Violence: Not At Risk (01/30/2024)   Humiliation, Afraid, Rape, and Kick questionnaire    Fear of Current or Ex-Partner: No    Emotionally Abused: No    Physically Abused: No    Sexually Abused: No    Family History  Problem Relation Age of Onset   Colon cancer Mother 59   Prostate cancer Father        dx 31s   Cancer Sister    Acute myelogenous leukemia Sister        dx 38s   Prostate cancer Brother    Melanoma Brother    Prostate cancer Brother    Melanoma Brother    HIV/AIDS Brother 17   Tuberculosis Paternal Aunt    Melanoma Other    Breast cancer Other    Melanoma Niece     Allergies  Allergen Reactions   Iodinated Contrast Media Shortness Of Breath and Other (See Comments)    Can be premedicated  Have been able to tolerate if given premeds (benadryl , prednisone )   Ibuprofen Other (See Comments)    "stomach problems"    Sulfa Antibiotics Other (See Comments)    "Stomach problems"  "Stomach problems"  "Stomach problems"  "Stomach problems"   Doxycycline Diarrhea and Nausea And Vomiting   Tetracyclines & Related Nausea Only and Nausea And Vomiting    Outpatient Medications Prior to Visit  Medication  Sig   acetaminophen  (TYLENOL ) 500 MG tablet Take 500 mg by mouth every 6 (six) hours as needed for moderate pain (pain score 4-6) or mild pain (pain score 1-3). Once a day   alendronate  (FOSAMAX ) 70 MG tablet Take 1 tablet (70 mg total) by mouth every Thursday. Take with a full glass of water  on an empty stomach. (Patient taking differently: Take 70 mg by mouth every Tuesday. Take with a full glass of water on an empty stomach.)   allopurinol  (ZYLOPRIM ) 100 MG tablet Take 1 tablet (100 mg total) by mouth daily.   apixaban  (ELIQUIS ) 5 MG TABS tablet Take 1 tablet (5 mg total) by mouth 2 (two) times daily.   aspirin  EC 81 MG tablet Take 1 tablet (81 mg total) by mouth daily. Swallow whole.   azelastine  (ASTELIN ) 0.1 % nasal spray Place 2 sprays into both nostrils at bedtime. Use in each nostril as directed (Patient taking differently: Place 2 sprays into both nostrils daily as needed for allergies or rhinitis. Use in each nostril as directed)   cyanocobalamin  (VITAMIN B12) 1000 MCG tablet Take 1,000 mcg by mouth daily. Monday to Friday   EPINEPHrine  0.3 mg/0.3 mL IJ SOAJ injection Inject 0.3 mg into the muscle as needed for anaphylaxis.   fluticasone  (FLONASE ) 50 MCG/ACT nasal spray Place 2 sprays into both nostrils daily as needed for allergies or rhinitis.   furosemide  (LASIX ) 20 MG tablet Take 1 tablet (20 mg total) by mouth 2 (two) times daily. (Patient taking differently: Take 20 mg by mouth 2 (two) times daily as needed for fluid or edema.)   gabapentin  (NEURONTIN ) 800 MG tablet TAKE 1 TABLET FOUR TIMES DAILY   levocetirizine (XYZAL ) 5 MG tablet TAKE 1 TABLET EVERY EVENING   losartan  (COZAAR ) 100 MG tablet Take 1 tablet (100 mg total) by mouth daily.   meclizine  (ANTIVERT ) 12.5 MG tablet Take 1 tablet (12.5 mg total) by mouth 3 (three) times daily as needed for dizziness.   metoprolol  succinate (TOPROL -XL) 25 MG 24 hr tablet TAKE 1 TABLET EVERY DAY (Patient taking differently: Take 25 mg by mouth  at bedtime.)   OXYGEN  Inhale 2 L/min into the lungs at bedtime. And as needed for SOB   pantoprazole  (PROTONIX ) 40 MG tablet TAKE 1 TABLET EVERY DAY   Polyethyl Glyc-Propyl Glyc PF (SYSTANE HYDRATION PF) 0.4-0.3 % SOLN Apply 1 drop to eye daily as needed (dry eyes).   rosuvastatin  (CRESTOR ) 40 MG tablet TAKE 1 TABLET EVERY DAY (Patient taking differently: Take 40 mg by mouth at bedtime.)   sertraline  (ZOLOFT ) 25 MG tablet TAKE 1 TABLET THREE TIMES DAILY   Vitamin D , Ergocalciferol , 50000 units CAPS Take 1 capsule by mouth once a week.   [DISCONTINUED] nitroGLYCERIN  (NITROSTAT ) 0.4 MG SL tablet Place 0.4 mg under the tongue every 5 (five) minutes as needed for chest pain.   cephALEXin  (KEFLEX ) 500 MG capsule Take 1 capsule (500 mg total) by mouth 2 (two) times daily for 7 days. (Patient not taking: Reported on 02/03/2024)   No facility-administered medications prior to visit.    Review of Systems  Constitutional:  Positive for malaise/fatigue.  HENT: Negative.    Eyes: Negative.   Respiratory: Negative.  Negative for cough and shortness of breath.   Cardiovascular: Negative.  Negative for chest pain, palpitations and leg swelling.  Gastrointestinal: Negative.  Negative for abdominal pain, constipation, diarrhea, heartburn, nausea and vomiting.  Genitourinary: Negative.  Negative for dysuria and flank pain.  Musculoskeletal: Negative.  Negative for joint pain and myalgias.  Skin: Negative.   Neurological: Negative.  Negative for dizziness and headaches.  Endo/Heme/Allergies: Negative.   Psychiatric/Behavioral: Negative.  Negative for depression and suicidal ideas. The patient is not nervous/anxious.        Objective:   BP (!) 158/66   Pulse 70   Ht 5'  11" (1.803 m)   Wt 199 lb (90.3 kg)   SpO2 99%   BMI 27.75 kg/m   Vitals:   02/03/24 1059  BP: (!) 158/66  Pulse: 70  Height: 5\' 11"  (1.803 m)  Weight: 199 lb (90.3 kg)  SpO2: 99%  BMI (Calculated): 27.77    Physical  Exam Vitals and nursing note reviewed.  Constitutional:      Appearance: Normal appearance.  HENT:     Head: Normocephalic and atraumatic.     Nose: Nose normal.     Mouth/Throat:     Mouth: Mucous membranes are moist.     Pharynx: Oropharynx is clear.  Eyes:     Conjunctiva/sclera: Conjunctivae normal.     Pupils: Pupils are equal, round, and reactive to light.  Cardiovascular:     Rate and Rhythm: Normal rate and regular rhythm.     Pulses: Normal pulses.     Heart sounds: Normal heart sounds. No murmur heard. Pulmonary:     Effort: Pulmonary effort is normal.     Breath sounds: Normal breath sounds. No wheezing.  Abdominal:     General: Bowel sounds are normal.     Palpations: Abdomen is soft.     Tenderness: There is no abdominal tenderness. There is no right CVA tenderness or left CVA tenderness.  Musculoskeletal:        General: Normal range of motion.     Cervical back: Normal range of motion.     Right lower leg: No edema.     Left lower leg: No edema.  Skin:    General: Skin is warm and dry.  Neurological:     General: No focal deficit present.     Mental Status: She is alert and oriented to person, place, and time.  Psychiatric:        Mood and Affect: Mood normal.        Behavior: Behavior normal.      No results found for any visits on 02/03/24.  Recent Results (from the past 2160 hours)  Lactate dehydrogenase     Status: None   Collection Time: 11/08/23 10:05 AM  Result Value Ref Range   LDH 134 98 - 192 U/L    Comment: Performed at [facility], [address] [place], [place] [address]  Vitamin B12     Status: None   Collection Time: 11/08/23 10:05 AM  Result Value Ref Range   Vitamin B-12 318 180 - 914 pg/mL    Comment: (NOTE) This assay is not validated for testing neonatal or myeloproliferative syndrome specimens for Vitamin B12 levels. Performed at [facility], [address], [place], [place] [address]   Ferritin      Status: None   Collection Time: 11/08/23 10:05 AM  Result Value Ref Range   Ferritin 18 11 - 307 ng/mL    Comment: Performed at [facility], [address] [place], [place] [address]  Iron and TIBC     Status: None   Collection Time: 11/08/23 10:05 AM  Result Value Ref Range   Iron 60 28 - 170 ug/dL   TIBC 130 865 - 784 ug/dL   Saturation Ratios 18 10.4 - 31.8 %   UIBC 268 ug/dL    Comment: Performed at [facility], [address] [place], [place] [address]  Kappa/lambda light chains     Status: Abnormal   Collection Time: 11/08/23 10:05 AM  Result Value Ref Range   Kappa  free light chain 32.9 (H) 3.3 - 19.4 mg/L   Lambda free light chains 19.0 5.7 - 26.3 mg/L   Kappa, lambda light chain ratio 1.73 (H) 0.26 - 1.65    Comment: (NOTE) Performed At: [facility] [address] [place], [place] [id number] Pearlean Botts MD WU:[id number]   CBC with Differential/Platelet     Status: Abnormal   Collection Time: 11/08/23 10:05 AM  Result Value Ref Range   WBC 4.4 4.0 - 10.5 K/uL   RBC 3.88 3.87 - 5.11 MIL/uL   Hemoglobin 11.2 (L) 12.0 - 15.0 g/dL   HCT 95.6 (L) 21.3 - 08.6 %   MCV 92.3 80.0 - 100.0 fL   MCH 28.9 26.0 - 34.0 pg   MCHC 31.3 30.0 - 36.0 g/dL   RDW 57.8 46.9 - 62.9 %   Platelets 186 150 - 400 K/uL   nRBC 0.0 0.0 - 0.2 %   Neutrophils Relative % 55 %   Neutro Abs 2.4 1.7 - 7.7 K/uL   Lymphocytes Relative 34 %   Lymphs Abs 1.5 0.7 - 4.0 K/uL   Monocytes Relative 6 %   Monocytes Absolute 0.3 0.1 - 1.0 K/uL   Eosinophils Relative 4 %   Eosinophils Absolute 0.2 0.0 - 0.5 K/uL   Basophils Relative 1 %   Basophils Absolute 0.0 0.0 - 0.1 K/uL   Immature Granulocytes 0 %   Abs Immature Granulocytes 0.01 0.00 - 0.07 K/uL    Comment: Performed at [facility], [address] [place], [place] [address]  Retic Panel     Status: Abnormal   Collection Time: 11/08/23 10:06 AM  Result Value Ref Range   Retic Ct Pct 0.8 0.4 - 3.1 %    RBC. 3.80 (L) 3.87 - 5.11 MIL/uL   Retic Count, Absolute 31.5 19.0 - 186.0 K/uL   Immature Retic Fract 4.9 2.3 - 15.9 %   Reticulocyte Hemoglobin 30.4 >27.9 pg    Comment: Performed at [facility], [address] [place], [place] [address]  Multiple Myeloma Panel (SPEP&IFE w/QIG)     Status: Abnormal   Collection Time: 11/08/23 10:06 AM  Result Value Ref Range   IgG (Immunoglobin G), Serum 751 586 - 1,602 mg/dL   IgA 102 64 - 725 mg/dL   IgM (Immunoglobulin M), Srm 86 26 - 217 mg/dL   Total Protein ELP 5.9 (L) 6.0 - 8.5 g/dL   Albumin SerPl Elph-Mcnc 3.5 2.9 - 4.4 g/dL   Alpha 1 0.2 0.0 - 0.4 g/dL   Alpha2 Glob SerPl Elph-Mcnc 0.6 0.4 - 1.0 g/dL   B-Globulin SerPl Elph-Mcnc 0.8 0.7 - 1.3 g/dL   Gamma Glob SerPl Elph-Mcnc 0.7 0.4 - 1.8 g/dL   M Protein SerPl Elph-Mcnc Not Observed Not Observed g/dL   Globulin, Total 2.4 2.2 - 3.9 g/dL   Albumin/Glob SerPl 1.5 0.7 - 1.7   IFE 1 Comment     Comment: (NOTE) The immunofixation pattern appears unremarkable. Evidence of monoclonal protein is not apparent.    Please Note Comment     Comment: (NOTE) Protein electrophoresis scan will follow via computer, mail, or courier delivery. Performed At: [facility] [address] [place], [place] [id number] Pearlean Botts MD QQ:[id number]   Protime-INR     Status: Abnormal   Collection Time: 12/22/23 10:48 AM  Result Value Ref Range   INR 1.1 0.9 - 1.2    Comment: Reference interval is for non-anticoagulated patients. Suggested INR therapeutic range for Vitamin K antagonist  therapy:    Standard Dose (moderate intensity                   therapeutic range):       2.0 - 3.0    Higher intensity therapeutic range       2.5 - 3.5    Prothrombin  Time 12.2 (H) 9.1 - 12.0 sec  Hemoglobin A1c     Status: None   Collection Time: 12/22/23 10:49 AM  Result Value Ref Range   Hgb A1c MFr Bld 5.6 4.8 - 5.6 %    Comment:          Prediabetes: 5.7 - 6.4          Diabetes: >6.4           Glycemic control for adults with diabetes: <7.0    Est. average glucose Bld gHb Est-mCnc 114 mg/dL  TSH     Status: None   Collection Time: 12/22/23 10:49 AM  Result Value Ref Range   TSH 0.827 0.450 - 4.500 uIU/mL  CMP14+EGFR     Status: Abnormal   Collection Time: 12/22/23 10:49 AM  Result Value Ref Range   Glucose 92 70 - 99 mg/dL   BUN 15 8 - 27 mg/dL   Creatinine, Ser 1.61 0.57 - 1.00 mg/dL   eGFR 68 >09 UE/AVW/0.98   BUN/Creatinine Ratio 18 12 - 28   Sodium 144 134 - 144 mmol/L   Potassium 4.0 3.5 - 5.2 mmol/L   Chloride 106 96 - 106 mmol/L   CO2 28 20 - 29 mmol/L   Calcium  9.5 8.7 - 10.3 mg/dL   Total Protein 5.8 (L) 6.0 - 8.5 g/dL   Albumin 4.0 3.7 - 4.7 g/dL   Globulin, Total 1.8 1.5 - 4.5 g/dL   Bilirubin Total 0.5 0.0 - 1.2 mg/dL   Alkaline Phosphatase 66 44 - 121 IU/L   AST 15 0 - 40 IU/L   ALT 8 0 - 32 IU/L  Lipid panel     Status: None   Collection Time: 12/22/23 10:49 AM  Result Value Ref Range   Cholesterol, Total 131 100 - 199 mg/dL   Triglycerides 119 0 - 149 mg/dL   HDL 58 >14 mg/dL   VLDL Cholesterol Cal 20 5 - 40 mg/dL   LDL Chol Calc (NIH) 53 0 - 99 mg/dL   Chol/HDL Ratio 2.3 0.0 - 4.4 ratio    Comment:                                   T. Chol/HDL Ratio                                             Men  Women                               1/2 Avg.Risk  3.4    3.3                                   Avg.Risk  5.0    4.4  2X Avg.Risk  9.6    7.1                                3X Avg.Risk 23.4   11.0   Culture, blood (routine x 2)     Status: Abnormal   Collection Time: 01/25/24  1:10 AM   Specimen: BLOOD  Result Value Ref Range   Specimen Description      BLOOD LEFT ANTECUBITAL Performed at [facility], [address], [place], [place] [address]    Special Requests      BOTTLES DRAWN AEROBIC AND ANAEROBIC Blood Culture results may not be optimal due to an inadequate volume of blood received in culture  bottles Performed at [facility], [address] [place], [place] [address]    Culture  Setup Time      GRAM POSITIVE COCCI IN BOTH AEROBIC AND ANAEROBIC BOTTLES CRITICAL VALUE NOTED.  VALUE IS CONSISTENT WITH PREVIOUSLY REPORTED AND CALLED VALUE. Performed at [facility], [address] [place], [place] [address]    Culture (A)     STREPTOCOCCUS PNEUMONIAE SUSCEPTIBILITIES PERFORMED ON PREVIOUS CULTURE WITHIN THE LAST 5 DAYS. Performed at [facility], [address], [place], [place] [address]    Report Status 01/28/2024 FINAL   CBC with Differential     Status: Abnormal   Collection Time: 01/25/24  1:12 AM  Result Value Ref Range   WBC 10.9 (H) 4.0 - 10.5 K/uL   RBC 3.90 3.87 - 5.11 MIL/uL   Hemoglobin 11.3 (L) 12.0 - 15.0 g/dL   HCT 53.6 (L) 64.4 - 03.4 %   MCV 91.0 80.0 - 100.0 fL   MCH 29.0 26.0 - 34.0 pg   MCHC 31.8 30.0 - 36.0 g/dL   RDW 74.2 59.5 - 63.8 %   Platelets 166 150 - 400 K/uL   nRBC 0.0 0.0 - 0.2 %   Neutrophils Relative % 77 %   Neutro Abs 8.5 (H) 1.7 - 7.7 K/uL   Lymphocytes Relative 18 %   Lymphs Abs 1.9 0.7 - 4.0 K/uL   Monocytes Relative 4 %   Monocytes Absolute 0.4 0.1 - 1.0 K/uL   Eosinophils Relative 1 %   Eosinophils Absolute 0.1 0.0 - 0.5 K/uL   Basophils Relative 0 %   Basophils Absolute 0.0 0.0 - 0.1 K/uL   Immature Granulocytes 0 %   Abs Immature Granulocytes 0.04 0.00 - 0.07 K/uL    Comment: Performed at [facility], [address] [place], [place] [address]  Comprehensive metabolic panel     Status: Abnormal   Collection Time: 01/25/24  1:12 AM  Result Value Ref Range   Sodium 136 135 - 145 mmol/L   Potassium 3.6 3.5 - 5.1 mmol/L   Chloride 101 98 - 111 mmol/L   CO2 27 22 - 32 mmol/L   Glucose, Bld 169 (H) 70 - 99 mg/dL    Comment: Glucose reference range applies only to samples taken after fasting for at least 8 hours.   BUN 19 8 - 23 mg/dL   Creatinine, Ser 3.29 0.44 - 1.00 mg/dL    Calcium  9.2 8.9 - 10.3 mg/dL   Total Protein 6.3 (L) 6.5 - 8.1 g/dL   Albumin 3.7 3.5 - 5.0 g/dL   AST 21 15 - 41 U/L   ALT 11 0 - 44 U/L   Alkaline Phosphatase 60 38 -  126 U/L   Total Bilirubin 0.6 0.0 - 1.2 mg/dL   GFR, Estimated >54 >09 mL/min    Comment: (NOTE) Calculated using the CKD-EPI Creatinine Equation (2021)    Anion gap 8 5 - 15    Comment: Performed at [facility], [address] [place], [place] [address]  Brain natriuretic peptide     Status: Abnormal   Collection Time: 01/25/24  1:12 AM  Result Value Ref Range   B Natriuretic Peptide 522.6 (H) 0.0 - 100.0 pg/mL    Comment: Performed at [facility], [address] [place], [place] [address]  Troponin I (High Sensitivity)     Status: Abnormal   Collection Time: 01/25/24  1:12 AM  Result Value Ref Range   Troponin I (High Sensitivity) 27 (H) <18 ng/L    Comment: (NOTE) Elevated high sensitivity troponin I (hsTnI) values and significant  changes across serial measurements may suggest ACS but many other  chronic and acute conditions are known to elevate hsTnI results.  Refer to the "Links" section for chest pain algorithms and additional  guidance. Performed at [facility], [address] [place], [place] [address]   Lactic acid, plasma     Status: None   Collection Time: 01/25/24  1:13 AM  Result Value Ref Range   Lactic Acid, Venous 1.6 0.5 - 1.9 mmol/L    Comment: Performed at [facility], [address] [place], [place] [address]  Culture, blood (routine x 2)     Status: Abnormal   Collection Time: 01/25/24  1:15 AM   Specimen: BLOOD  Result Value Ref Range   Specimen Description      BLOOD RIGHT ANTECUBITAL Performed at [facility], [address] [place], [place] [address]    Special Requests      BOTTLES DRAWN AEROBIC AND ANAEROBIC Blood Culture results may not be optimal due to an inadequate volume of blood received in culture bottles Performed  at [facility], [address] [place], [place] [address]    Culture  Setup Time      GRAM POSITIVE COCCI IN BOTH AEROBIC AND ANAEROBIC BOTTLES Organism ID to follow CRITICAL RESULT CALLED TO, READ BACK BY AND VERIFIED WITH: TREY GREENWOOD 1616 01/25/24 MU Performed at [facility], [address] [place], [place] [address]    Culture STREPTOCOCCUS PNEUMONIAE (A)    Report Status 01/28/2024 FINAL    Organism ID, Bacteria STREPTOCOCCUS PNEUMONIAE       Susceptibility   Streptococcus pneumoniae - MIC*    ERYTHROMYCIN <=0.12 SENSITIVE Sensitive     LEVOFLOXACIN 1 SENSITIVE Sensitive     VANCOMYCIN  0.5 SENSITIVE Sensitive     PENICILLIN (meningitis) <=0.06 SENSITIVE Sensitive     PENO - penicillin <=0.06      PENICILLIN (non-meningitis) <=0.06 SENSITIVE Sensitive     PENICILLIN (oral) <=0.06 SENSITIVE Sensitive     CEFTRIAXONE  (non-meningitis) <=0.12 SENSITIVE Sensitive     CEFTRIAXONE  (meningitis) <=0.12 SENSITIVE Sensitive     * STREPTOCOCCUS PNEUMONIAE  Blood Culture ID Panel (Reflexed)     Status: Abnormal   Collection Time: 01/25/24  1:15 AM  Result Value Ref Range   Enterococcus faecalis NOT DETECTED NOT DETECTED   Enterococcus Faecium NOT DETECTED NOT DETECTED   Listeria monocytogenes NOT DETECTED NOT DETECTED   Staphylococcus species NOT DETECTED NOT DETECTED   Staphylococcus aureus (BCID) NOT DETECTED NOT DETECTED   Staphylococcus epidermidis NOT DETECTED NOT DETECTED   Staphylococcus lugdunensis  NOT DETECTED NOT DETECTED   Streptococcus species DETECTED (A) NOT DETECTED    Comment: CRITICAL RESULT CALLED TO, READ BACK BY AND VERIFIED WITH: TREY GREENWOOD 1616 01/25/24 MU    Streptococcus agalactiae NOT DETECTED NOT DETECTED   Streptococcus pneumoniae DETECTED (A) NOT DETECTED    Comment: CRITICAL RESULT CALLED TO, READ BACK BY AND VERIFIED WITH: TREY GREENWOOD 1616 01/25/24 MU    Streptococcus pyogenes NOT DETECTED NOT DETECTED    A.calcoaceticus-baumannii NOT DETECTED NOT DETECTED   Bacteroides fragilis NOT DETECTED NOT DETECTED   Enterobacterales NOT DETECTED NOT DETECTED   Enterobacter cloacae complex NOT DETECTED NOT DETECTED   Escherichia coli NOT DETECTED NOT DETECTED   Klebsiella aerogenes NOT DETECTED NOT DETECTED   Klebsiella oxytoca NOT DETECTED NOT DETECTED   Klebsiella pneumoniae NOT DETECTED NOT DETECTED   Proteus species NOT DETECTED NOT DETECTED   Salmonella species NOT DETECTED NOT DETECTED   Serratia marcescens NOT DETECTED NOT DETECTED   Haemophilus influenzae NOT DETECTED NOT DETECTED   Neisseria meningitidis NOT DETECTED NOT DETECTED   Pseudomonas aeruginosa NOT DETECTED NOT DETECTED   Stenotrophomonas maltophilia NOT DETECTED NOT DETECTED   Candida albicans NOT DETECTED NOT DETECTED   Candida auris NOT DETECTED NOT DETECTED   Candida glabrata NOT DETECTED NOT DETECTED   Candida krusei NOT DETECTED NOT DETECTED   Candida parapsilosis NOT DETECTED NOT DETECTED   Candida tropicalis NOT DETECTED NOT DETECTED   Cryptococcus neoformans/gattii NOT DETECTED NOT DETECTED    Comment: Performed at [facility], [address] [place], [place] [address]  Urinalysis, w/ Reflex to Culture (Infection Suspected) -Urine, Catheterized     Status: Abnormal   Collection Time: 01/25/24  1:24 AM  Result Value Ref Range   Specimen Source URINE, CATHETERIZED    Color, Urine YELLOW (A) YELLOW   APPearance CLEAR (A) CLEAR   Specific Gravity, Urine 1.017 1.005 - 1.030   pH 6.0 5.0 - 8.0   Glucose, UA NEGATIVE NEGATIVE mg/dL   Hgb urine dipstick NEGATIVE NEGATIVE   Bilirubin Urine NEGATIVE NEGATIVE   Ketones, ur NEGATIVE NEGATIVE mg/dL   Protein, ur 30 (A) NEGATIVE mg/dL   Nitrite NEGATIVE NEGATIVE   Leukocytes,Ua NEGATIVE NEGATIVE   RBC / HPF 0-5 0 - 5 RBC/hpf   WBC, UA 0-5 0 - 5 WBC/hpf    Comment:        Reflex urine culture not performed if WBC <=10, OR if Squamous epithelial cells >5. If  Squamous epithelial cells >5 suggest recollection.    Bacteria, UA NONE SEEN NONE SEEN   Squamous Epithelial / HPF 0-5 0 - 5 /HPF   Mucus PRESENT     Comment: Performed at [facility], [address] [place], [place] [address]  Lactic acid, plasma     Status: None   Collection Time: 01/25/24  3:17 AM  Result Value Ref Range   Lactic Acid, Venous 1.3 0.5 - 1.9 mmol/L    Comment: Performed at [facility], [address] [place], [place] [address]  Troponin I (High Sensitivity)     Status: Abnormal   Collection Time: 01/25/24  3:17 AM  Result Value Ref Range   Troponin I (High Sensitivity) 433 (HH) <18 ng/L    Comment: CRITICAL RESULT CALLED TO, READ BACK BY AND VERIFIED WITH  KIMBERLEY EVANS AT 9147 01/25/24 JG (NOTE) Elevated high sensitivity troponin I (hsTnI) values and significant  changes across serial measurements may suggest ACS but many other  chronic and acute  conditions are known to elevate hsTnI results.  Refer to the "Links" section for chest pain algorithms and additional  guidance. Performed at [facility], [address] [place], [place] [address]   Resp panel by RT-PCR (RSV, Flu A&B, Covid) Anterior Nasal Swab     Status: None   Collection Time: 01/25/24  3:48 AM   Specimen: Anterior Nasal Swab  Result Value Ref Range   SARS Coronavirus 2 by RT PCR NEGATIVE NEGATIVE    Comment: (NOTE) SARS-CoV-2 target nucleic acids are NOT DETECTED.  The SARS-CoV-2 RNA is generally detectable in upper respiratory specimens during the acute phase of infection. The lowest concentration of SARS-CoV-2 viral copies this assay can detect is 138 copies/mL. A negative result does not preclude SARS-Cov-2 infection and should not be used as the sole basis for treatment or other patient management decisions. A negative result may occur with  improper specimen collection/handling, submission of specimen other than nasopharyngeal swab, presence of viral  mutation(s) within the areas targeted by this assay, and inadequate number of viral copies(<138 copies/mL). A negative result must be combined with clinical observations, patient history, and epidemiological information. The expected result is Negative.  Fact Sheet for Patients:  BloggerCourse.com  Fact Sheet for Healthcare Providers:  SeriousBroker.it  This test is no t yet approved or cleared by the [place]  FDA and  has been authorized for detection and/or diagnosis of SARS-CoV-2 by FDA under an Emergency Use Authorization (EUA). This EUA will remain  in effect (meaning this test can be used) for the duration of the COVID-19 declaration under Section 564(b)(1) of the Act, 21 U.S.C.section 360bbb-3(b)(1), unless the authorization is terminated  or revoked sooner.       Influenza A by PCR NEGATIVE NEGATIVE   Influenza B by PCR NEGATIVE NEGATIVE    Comment: (NOTE) The Xpert Xpress SARS-CoV-2/FLU/RSV plus assay is intended as an aid in the diagnosis of influenza from Nasopharyngeal swab specimens and should not be used as a sole basis for treatment. Nasal washings and aspirates are unacceptable for Xpert Xpress SARS-CoV-2/FLU/RSV testing.  Fact Sheet for Patients: BloggerCourse.com  Fact Sheet for Healthcare Providers: SeriousBroker.it  This test is not yet approved or cleared by the [place]  FDA and has been authorized for detection and/or diagnosis of SARS-CoV-2 by FDA under an Emergency Use Authorization (EUA). This EUA will remain in effect (meaning this test can be used) for the duration of the COVID-19 declaration under Section 564(b)(1) of the Act, 21 U.S.C. section 360bbb-3(b)(1), unless the authorization is terminated or revoked.     Resp Syncytial Virus by PCR NEGATIVE NEGATIVE    Comment: (NOTE) Fact Sheet for  Patients: BloggerCourse.com  Fact Sheet for Healthcare Providers: SeriousBroker.it  This test is not yet approved or cleared by the [place]  FDA and has been authorized for detection and/or diagnosis of SARS-CoV-2 by FDA under an Emergency Use Authorization (EUA). This EUA will remain in effect (meaning this test can be used) for the duration of the COVID-19 declaration under Section 564(b)(1) of the Act, 21 U.S.C. section 360bbb-3(b)(1), unless the authorization is terminated or revoked.  Performed at [facility], [address] [place], [place] [address]   APTT     Status: None   Collection Time: 01/25/24  4:40 AM  Result Value Ref Range   aPTT 35 24 - 36 seconds    Comment: Performed at [facility], [address] [place], [place] [address]  Protime-INR  Status: Abnormal   Collection Time: 01/25/24  4:40 AM  Result Value Ref Range   Prothrombin  Time 16.2 (H) 11.4 - 15.2 seconds   INR 1.3 (H) 0.8 - 1.2    Comment: (NOTE) INR goal varies based on device and disease states. Performed at [facility], [address] [place], [place] [address]   Heparin  level (unfractionated)     Status: Abnormal   Collection Time: 01/25/24  4:40 AM  Result Value Ref Range   Heparin  Unfractionated >1.10 (H) 0.30 - 0.70 IU/mL    Comment: (NOTE) The clinical reportable range upper limit is being lowered to >1.10 to align with the FDA approved guidance for the current laboratory assay.  If heparin  results are below expected values, and patient dosage has  been confirmed, suggest follow up testing of antithrombin III levels. Performed at [facility], [address] [place], [place] [address]   CBG monitoring, ED     Status: Abnormal   Collection Time: 01/25/24  7:27 AM  Result Value Ref Range   Glucose-Capillary 102 (H) 70 - 99 mg/dL    Comment: Glucose reference range applies only to  samples taken after fasting for at least 8 hours.  CBG monitoring, ED     Status: Abnormal   Collection Time: 01/25/24 11:56 AM  Result Value Ref Range   Glucose-Capillary 108 (H) 70 - 99 mg/dL    Comment: Glucose reference range applies only to samples taken after fasting for at least 8 hours.  APTT     Status: Abnormal   Collection Time: 01/25/24 12:49 PM  Result Value Ref Range   aPTT >200 (HH) 24 - 36 seconds    Comment:        IF BASELINE aPTT IS ELEVATED, SUGGEST PATIENT RISK ASSESSMENT BE USED TO DETERMINE APPROPRIATE ANTICOAGULANT THERAPY. REPEATED TO VERIFY CRITICAL RESULT CALLED TO, READ BACK BY AND VERIFIED WITH: Tiburcio Fly, RN [phone] 01/25/24 GM Performed at [facility], [address] [place], [place] [address]   CBG monitoring, ED     Status: None   Collection Time: 01/25/24  4:24 PM  Result Value Ref Range   Glucose-Capillary 96 70 - 99 mg/dL    Comment: Glucose reference range applies only to samples taken after fasting for at least 8 hours.  CBG monitoring, ED     Status: None   Collection Time: 01/25/24  9:43 PM  Result Value Ref Range   Glucose-Capillary 77 70 - 99 mg/dL    Comment: Glucose reference range applies only to samples taken after fasting for at least 8 hours.  ECHOCARDIOGRAM COMPLETE     Status: None   Collection Time: 01/25/24 10:26 PM  Result Value Ref Range   Weight 3,326.3 oz   Height 71 in   BP 115/50 mmHg   S' Lateral 3.50 cm   Area-P 1/2 5.46 cm2   Est EF 60 - 65%   APTT     Status: Abnormal   Collection Time: 01/25/24 11:44 PM  Result Value Ref Range   aPTT 180 (HH) 24 - 36 seconds    Comment:        IF BASELINE aPTT IS ELEVATED, SUGGEST PATIENT RISK ASSESSMENT BE USED TO DETERMINE APPROPRIATE ANTICOAGULANT THERAPY. REPEATED TO VERIFY CRITICAL RESULT CALLED TO, READ BACK BY AND VERIFIED WITH:  KAITLYN OBERSKI AT 8295 01/26/24 JG Performed at [facility], [address], [place], [place] [address]   Basic  metabolic panel with GFR  Status: Abnormal   Collection Time: 01/26/24  4:14 AM  Result Value Ref Range   Sodium 140 135 - 145 mmol/L   Potassium 4.0 3.5 - 5.1 mmol/L   Chloride 104 98 - 111 mmol/L   CO2 25 22 - 32 mmol/L   Glucose, Bld 89 70 - 99 mg/dL    Comment: Glucose reference range applies only to samples taken after fasting for at least 8 hours.   BUN 28 (H) 8 - 23 mg/dL   Creatinine, Ser 1.91 (H) 0.44 - 1.00 mg/dL   Calcium  8.3 (L) 8.9 - 10.3 mg/dL   GFR, Estimated 53 (L) >60 mL/min    Comment: (NOTE) Calculated using the CKD-EPI Creatinine Equation (2021)    Anion gap 11 5 - 15    Comment: Performed at [facility], [address] [place], [place] [address]  CBC     Status: Abnormal   Collection Time: 01/26/24  4:14 AM  Result Value Ref Range   WBC 18.4 (H) 4.0 - 10.5 K/uL   RBC 3.43 (L) 3.87 - 5.11 MIL/uL   Hemoglobin 10.0 (L) 12.0 - 15.0 g/dL   HCT 56.2 (L) 13.0 - 86.5 %   MCV 95.3 80.0 - 100.0 fL   MCH 29.2 26.0 - 34.0 pg   MCHC 30.6 30.0 - 36.0 g/dL   RDW 78.4 69.6 - 29.5 %   Platelets 154 150 - 400 K/uL   nRBC 0.0 0.0 - 0.2 %    Comment: Performed at [facility], [address] [place], [place] [address]  CBG monitoring, ED     Status: None   Collection Time: 01/26/24  7:35 AM  Result Value Ref Range   Glucose-Capillary 79 70 - 99 mg/dL    Comment: Glucose reference range applies only to samples taken after fasting for at least 8 hours.  CBG monitoring, ED     Status: None   Collection Time: 01/26/24 11:38 AM  Result Value Ref Range   Glucose-Capillary 92 70 - 99 mg/dL    Comment: Glucose reference range applies only to samples taken after fasting for at least 8 hours.  Heparin  level (unfractionated)     Status: Abnormal   Collection Time: 01/26/24  1:29 PM  Result Value Ref Range   Heparin  Unfractionated 1.01 (H) 0.30 - 0.70 IU/mL    Comment: (NOTE) The clinical reportable range upper limit is being lowered to >1.10 to align  with the FDA approved guidance for the current laboratory assay.  If heparin  results are below expected values, and patient dosage has  been confirmed, suggest follow up testing of antithrombin III levels. Performed at [facility], [address] [place], [place] [address]   APTT     Status: Abnormal   Collection Time: 01/26/24  1:29 PM  Result Value Ref Range   aPTT 44 (H) 24 - 36 seconds    Comment:        IF BASELINE aPTT IS ELEVATED, SUGGEST PATIENT RISK ASSESSMENT BE USED TO DETERMINE APPROPRIATE ANTICOAGULANT THERAPY. Performed at [facility], [address] [place], [place] [address]   Glucose, capillary     Status: None   Collection Time: 01/26/24  5:27 PM  Result Value Ref Range   Glucose-Capillary 74 70 - 99 mg/dL    Comment: Glucose reference range applies only to samples taken after fasting for at least 8 hours.  Glucose, capillary     Status: None   Collection Time: 01/26/24  9:05 PM  Result Value Ref Range   Glucose-Capillary 73 70 - 99 mg/dL    Comment: Glucose reference range applies only to samples taken after fasting for at least 8 hours.  APTT     Status: Abnormal   Collection Time: 01/26/24 11:21 PM  Result Value Ref Range   aPTT 83 (H) 24 - 36 seconds    Comment:        IF BASELINE aPTT IS ELEVATED, SUGGEST PATIENT RISK ASSESSMENT BE USED TO DETERMINE APPROPRIATE ANTICOAGULANT THERAPY. Performed at [facility], [address] [place], [place] [address]   Basic metabolic panel with GFR     Status: Abnormal   Collection Time: 01/27/24  6:18 AM  Result Value Ref Range   Sodium 139 135 - 145 mmol/L   Potassium 3.7 3.5 - 5.1 mmol/L   Chloride 108 98 - 111 mmol/L   CO2 25 22 - 32 mmol/L   Glucose, Bld 89 70 - 99 mg/dL    Comment: Glucose reference range applies only to samples taken after fasting for at least 8 hours.   BUN 23 8 - 23 mg/dL   Creatinine, Ser 9.81 0.44 - 1.00 mg/dL   Calcium  8.4 (L) 8.9 - 10.3 mg/dL    GFR, Estimated >19 >14 mL/min    Comment: (NOTE) Calculated using the CKD-EPI Creatinine Equation (2021)    Anion gap 6 5 - 15    Comment: Performed at [facility], [address] [place], [place] [address]  CBC     Status: Abnormal   Collection Time: 01/27/24  6:18 AM  Result Value Ref Range   WBC 14.4 (H) 4.0 - 10.5 K/uL   RBC 3.26 (L) 3.87 - 5.11 MIL/uL   Hemoglobin 9.5 (L) 12.0 - 15.0 g/dL   HCT 62.1 (L) 30.8 - 65.7 %   MCV 92.0 80.0 - 100.0 fL   MCH 29.1 26.0 - 34.0 pg   MCHC 31.7 30.0 - 36.0 g/dL   RDW 84.6 96.2 - 95.2 %   Platelets 126 (L) 150 - 400 K/uL   nRBC 0.0 0.0 - 0.2 %    Comment: Performed at [facility], [address] [place], [place] [address]  APTT     Status: Abnormal   Collection Time: 01/27/24  6:18 AM  Result Value Ref Range   aPTT 73 (H) 24 - 36 seconds    Comment:        IF BASELINE aPTT IS ELEVATED, SUGGEST PATIENT RISK ASSESSMENT BE USED TO DETERMINE APPROPRIATE ANTICOAGULANT THERAPY. Performed at [facility], [address] [place], [place] [address]   Heparin  level (unfractionated)     Status: None   Collection Time: 01/27/24  6:18 AM  Result Value Ref Range   Heparin  Unfractionated 0.48 0.30 - 0.70 IU/mL    Comment: (NOTE) The clinical reportable range upper limit is being lowered to >1.10 to align with the FDA approved guidance for the current laboratory assay.  If heparin  results are below expected values, and patient dosage has  been confirmed, suggest follow up testing of antithrombin III levels. Performed at [facility], [address] [place], [place] [address]   Glucose, capillary     Status: None   Collection Time: 01/27/24  8:29 AM  Result Value Ref Range   Glucose-Capillary 82 70 - 99 mg/dL    Comment: Glucose reference range applies only to samples taken after fasting for at least 8 hours.  Assessment & Plan:  Continue current medications.  Keep up with p.o.  hydration.  Monitor blood pressure at home.  Check labs today.  Chest x-ray for follow-up pneumonia will be done in 1 week. Problem List Items Addressed This Visit     Essential hypertension, benign   Relevant Medications   nitroGLYCERIN  (NITROSTAT ) 0.4 MG SL tablet   Other Relevant Orders   CMP14+EGFR   HLD (hyperlipidemia)   Relevant Medications   nitroGLYCERIN  (NITROSTAT ) 0.4 MG SL tablet   CAD (coronary artery disease)   Relevant Medications   nitroGLYCERIN  (NITROSTAT ) 0.4 MG SL tablet   Other Visit Diagnoses       Bilateral upper lobe community acquired pneumonia    -  Primary   Relevant Orders   DG Chest 2 View     Other iron deficiency anemia       Relevant Orders   CBC with Diff       Follow up 1 month.   Total time spent: 30 minutes  Aisha Hove, MD  02/03/2024   This document may have been prepared by [facility] Voice Recognition software and as such may include unintentional dictation errors.

## 2024-02-04 LAB — CMP14+EGFR
ALT: 11 IU/L (ref 0–32)
AST: 14 IU/L (ref 0–40)
Albumin: 3.4 g/dL — ABNORMAL LOW (ref 3.7–4.7)
Alkaline Phosphatase: 75 IU/L (ref 44–121)
BUN/Creatinine Ratio: 12 (ref 12–28)
BUN: 8 mg/dL (ref 8–27)
Bilirubin Total: 0.3 mg/dL (ref 0.0–1.2)
CO2: 23 mmol/L (ref 20–29)
Calcium: 8.9 mg/dL (ref 8.7–10.3)
Chloride: 108 mmol/L — ABNORMAL HIGH (ref 96–106)
Creatinine, Ser: 0.68 mg/dL (ref 0.57–1.00)
Globulin, Total: 2 g/dL (ref 1.5–4.5)
Glucose: 83 mg/dL (ref 70–99)
Potassium: 4.1 mmol/L (ref 3.5–5.2)
Sodium: 145 mmol/L — ABNORMAL HIGH (ref 134–144)
Total Protein: 5.4 g/dL — ABNORMAL LOW (ref 6.0–8.5)
eGFR: 86 mL/min/{1.73_m2} (ref >59)

## 2024-02-04 LAB — CBC WITH DIFFERENTIAL/PLATELET
Basophils Absolute: 0.1 10*3/uL (ref 0.0–0.2)
Basos: 1 %
EOS (ABSOLUTE): 0.5 10*3/uL — ABNORMAL HIGH (ref 0.0–0.4)
Eos: 6 %
Hematocrit: 31.2 % — ABNORMAL LOW (ref 34.0–46.6)
Hemoglobin: 9.5 g/dL — ABNORMAL LOW (ref 11.1–15.9)
Immature Grans (Abs): 0 10*3/uL (ref 0.0–0.1)
Immature Granulocytes: 1 %
Lymphocytes Absolute: 1.8 10*3/uL (ref 0.7–3.1)
Lymphs: 21 %
MCH: 27.9 pg (ref 26.6–33.0)
MCHC: 30.4 g/dL — ABNORMAL LOW (ref 31.5–35.7)
MCV: 92 fL (ref 79–97)
Monocytes Absolute: 0.4 10*3/uL (ref 0.1–0.9)
Monocytes: 4 %
Neutrophils Absolute: 5.8 10*3/uL (ref 1.4–7.0)
Neutrophils: 67 %
Platelets: 279 10*3/uL (ref 150–450)
RBC: 3.4 x10E6/uL — ABNORMAL LOW (ref 3.77–5.28)
RDW: 13.1 % (ref 11.7–15.4)
WBC: 8.6 10*3/uL (ref 3.4–10.8)

## 2024-02-09 ENCOUNTER — Ambulatory Visit
Admission: RE | Admit: 2024-02-09 | Discharge: 2024-02-09 | Disposition: A | Discharge status: Home / Self Care | Attending: Internal Medicine | Admitting: Internal Medicine

## 2024-02-09 ENCOUNTER — Ambulatory Visit
Admission: RE | Admit: 2024-02-09 | Discharge: 2024-02-09 | Disposition: A | Discharge status: Home / Self Care | Source: Ambulatory Visit | Attending: Internal Medicine | Admitting: Internal Medicine

## 2024-02-09 ENCOUNTER — Other Ambulatory Visit: Payer: Self-pay

## 2024-02-09 DIAGNOSIS — J189 Pneumonia, unspecified organism: Secondary | ICD-10-CM | POA: Insufficient documentation

## 2024-02-09 MED ORDER — FUROSEMIDE 20 MG PO TABS: 20.0000 mg | ORAL_TABLET | Freq: Two times a day (BID) | ORAL | 3 refills | Status: DC | PRN

## 2024-02-10 ENCOUNTER — Encounter: Payer: Self-pay | Admitting: Internal Medicine

## 2024-02-13 ENCOUNTER — Emergency Department

## 2024-02-13 ENCOUNTER — Inpatient Hospital Stay
Admission: EM | Admit: 2024-02-13 | Discharge: 2024-02-15 | DRG: 291 | Disposition: A | Discharge status: Home / Self Care | Attending: Student | Admitting: Student

## 2024-02-13 ENCOUNTER — Other Ambulatory Visit: Payer: Self-pay

## 2024-02-13 DIAGNOSIS — Z79899 Other long term (current) drug therapy: Secondary | ICD-10-CM

## 2024-02-13 DIAGNOSIS — R918 Other nonspecific abnormal finding of lung field: Secondary | ICD-10-CM | POA: Diagnosis not present

## 2024-02-13 DIAGNOSIS — N189 Chronic kidney disease, unspecified: Secondary | ICD-10-CM | POA: Diagnosis present

## 2024-02-13 DIAGNOSIS — M109 Gout, unspecified: Secondary | ICD-10-CM | POA: Diagnosis present

## 2024-02-13 DIAGNOSIS — Z7901 Long term (current) use of anticoagulants: Secondary | ICD-10-CM

## 2024-02-13 DIAGNOSIS — Z96642 Presence of left artificial hip joint: Secondary | ICD-10-CM | POA: Diagnosis present

## 2024-02-13 DIAGNOSIS — K219 Gastro-esophageal reflux disease without esophagitis: Secondary | ICD-10-CM | POA: Diagnosis present

## 2024-02-13 DIAGNOSIS — T361X5A Adverse effect of cephalosporins and other beta-lactam antibiotics, initial encounter: Secondary | ICD-10-CM | POA: Diagnosis present

## 2024-02-13 DIAGNOSIS — G629 Polyneuropathy, unspecified: Secondary | ICD-10-CM | POA: Diagnosis present

## 2024-02-13 DIAGNOSIS — K521 Toxic gastroenteritis and colitis: Secondary | ICD-10-CM | POA: Diagnosis present

## 2024-02-13 DIAGNOSIS — I2489 Other forms of acute ischemic heart disease: Secondary | ICD-10-CM | POA: Diagnosis present

## 2024-02-13 DIAGNOSIS — I10 Essential (primary) hypertension: Secondary | ICD-10-CM | POA: Diagnosis not present

## 2024-02-13 DIAGNOSIS — G4733 Obstructive sleep apnea (adult) (pediatric): Secondary | ICD-10-CM | POA: Diagnosis present

## 2024-02-13 DIAGNOSIS — Z90710 Acquired absence of both cervix and uterus: Secondary | ICD-10-CM

## 2024-02-13 DIAGNOSIS — Z882 Allergy status to sulfonamides status: Secondary | ICD-10-CM | POA: Diagnosis not present

## 2024-02-13 DIAGNOSIS — Z86718 Personal history of other venous thrombosis and embolism: Secondary | ICD-10-CM | POA: Diagnosis not present

## 2024-02-13 DIAGNOSIS — Z85820 Personal history of malignant melanoma of skin: Secondary | ICD-10-CM | POA: Diagnosis not present

## 2024-02-13 DIAGNOSIS — Z7982 Long term (current) use of aspirin: Secondary | ICD-10-CM | POA: Diagnosis not present

## 2024-02-13 DIAGNOSIS — I480 Paroxysmal atrial fibrillation: Secondary | ICD-10-CM | POA: Diagnosis present

## 2024-02-13 DIAGNOSIS — J90 Pleural effusion, not elsewhere classified: Secondary | ICD-10-CM

## 2024-02-13 DIAGNOSIS — Z7983 Long term (current) use of bisphosphonates: Secondary | ICD-10-CM | POA: Diagnosis not present

## 2024-02-13 DIAGNOSIS — Y95 Nosocomial condition: Secondary | ICD-10-CM | POA: Diagnosis present

## 2024-02-13 DIAGNOSIS — I5033 Acute on chronic diastolic (congestive) heart failure: Secondary | ICD-10-CM

## 2024-02-13 DIAGNOSIS — I2510 Atherosclerotic heart disease of native coronary artery without angina pectoris: Secondary | ICD-10-CM | POA: Diagnosis present

## 2024-02-13 DIAGNOSIS — R079 Chest pain, unspecified: Principal | ICD-10-CM

## 2024-02-13 DIAGNOSIS — M810 Age-related osteoporosis without current pathological fracture: Secondary | ICD-10-CM | POA: Diagnosis present

## 2024-02-13 DIAGNOSIS — J189 Pneumonia, unspecified organism: Secondary | ICD-10-CM | POA: Diagnosis not present

## 2024-02-13 DIAGNOSIS — Z91041 Radiographic dye allergy status: Secondary | ICD-10-CM

## 2024-02-13 DIAGNOSIS — E785 Hyperlipidemia, unspecified: Secondary | ICD-10-CM | POA: Diagnosis not present

## 2024-02-13 DIAGNOSIS — R0789 Other chest pain: Secondary | ICD-10-CM | POA: Diagnosis not present

## 2024-02-13 DIAGNOSIS — I130 Hypertensive heart and chronic kidney disease with heart failure and stage 1 through stage 4 chronic kidney disease, or unspecified chronic kidney disease: Principal | ICD-10-CM | POA: Diagnosis present

## 2024-02-13 DIAGNOSIS — R0989 Other specified symptoms and signs involving the circulatory and respiratory systems: Secondary | ICD-10-CM | POA: Diagnosis not present

## 2024-02-13 DIAGNOSIS — Z881 Allergy status to other antibiotic agents status: Secondary | ICD-10-CM

## 2024-02-13 DIAGNOSIS — R58 Hemorrhage, not elsewhere classified: Secondary | ICD-10-CM | POA: Diagnosis not present

## 2024-02-13 DIAGNOSIS — R1084 Generalized abdominal pain: Secondary | ICD-10-CM | POA: Diagnosis not present

## 2024-02-13 LAB — CBC WITH DIFFERENTIAL/PLATELET
Abs Immature Granulocytes: 0.01 10*3/uL (ref 0.00–0.07)
Basophils Absolute: 0.1 10*3/uL (ref 0.0–0.1)
Basophils Relative: 1 %
Eosinophils Absolute: 0.6 10*3/uL — ABNORMAL HIGH (ref 0.0–0.5)
Eosinophils Relative: 11 %
HCT: 32.4 % — ABNORMAL LOW (ref 36.0–46.0)
Hemoglobin: 9.8 g/dL — ABNORMAL LOW (ref 12.0–15.0)
Immature Granulocytes: 0 %
Lymphocytes Relative: 44 %
Lymphs Abs: 2.6 10*3/uL (ref 0.7–4.0)
MCH: 28.2 pg (ref 26.0–34.0)
MCHC: 30.2 g/dL (ref 30.0–36.0)
MCV: 93.1 fL (ref 80.0–100.0)
Monocytes Absolute: 0.4 10*3/uL (ref 0.1–1.0)
Monocytes Relative: 6 %
Neutro Abs: 2.3 10*3/uL (ref 1.7–7.7)
Neutrophils Relative %: 38 %
Platelets: 293 10*3/uL (ref 150–400)
RBC: 3.48 MIL/uL — ABNORMAL LOW (ref 3.87–5.11)
RDW: 13.6 % (ref 11.5–15.5)
WBC: 5.9 10*3/uL (ref 4.0–10.5)
nRBC: 0 % (ref 0.0–0.2)

## 2024-02-13 LAB — LIPASE, BLOOD: Lipase: 28 U/L (ref 11–51)

## 2024-02-13 LAB — RESPIRATORY PANEL BY PCR

## 2024-02-13 LAB — COMPREHENSIVE METABOLIC PANEL WITH GFR
ALT: 21 U/L (ref 0–44)
AST: 37 U/L (ref 15–41)
Albumin: 3.1 g/dL — ABNORMAL LOW (ref 3.5–5.0)
Alkaline Phosphatase: 63 U/L (ref 38–126)
Anion gap: 6 (ref 5–15)
BUN: 12 mg/dL (ref 8–23)
CO2: 29 mmol/L (ref 22–32)
Calcium: 9.2 mg/dL (ref 8.9–10.3)
Chloride: 106 mmol/L (ref 98–111)
Creatinine, Ser: 0.92 mg/dL (ref 0.44–1.00)
GFR, Estimated: >60 mL/min (ref >60)
Glucose, Bld: 98 mg/dL (ref 70–99)
Potassium: 4.4 mmol/L (ref 3.5–5.1)
Sodium: 141 mmol/L (ref 135–145)
Total Bilirubin: 0.7 mg/dL (ref 0.0–1.2)
Total Protein: 6 g/dL — ABNORMAL LOW (ref 6.5–8.1)

## 2024-02-13 LAB — URINALYSIS, ROUTINE W REFLEX MICROSCOPIC
Bacteria, UA: NONE SEEN
Bilirubin Urine: NEGATIVE
Glucose, UA: NEGATIVE mg/dL
Hgb urine dipstick: NEGATIVE
Ketones, ur: NEGATIVE mg/dL
Nitrite: NEGATIVE
Protein, ur: NEGATIVE mg/dL
Specific Gravity, Urine: 1.008 (ref 1.005–1.030)
pH: 7 (ref 5.0–8.0)

## 2024-02-13 LAB — RESP PANEL BY RT-PCR (RSV, FLU A&B, COVID)  RVPGX2
Influenza A by PCR: NEGATIVE
Influenza B by PCR: NEGATIVE
Resp Syncytial Virus by PCR: NEGATIVE
SARS Coronavirus 2 by RT PCR: NEGATIVE

## 2024-02-13 LAB — STREP PNEUMONIAE URINARY ANTIGEN: Strep Pneumo Urinary Antigen: NEGATIVE

## 2024-02-13 LAB — TROPONIN I (HIGH SENSITIVITY)
Troponin I (High Sensitivity): 18 ng/L — ABNORMAL HIGH (ref <18)
Troponin I (High Sensitivity): 17 ng/L (ref <18)

## 2024-02-13 LAB — MAGNESIUM: Magnesium: 2 mg/dL (ref 1.7–2.4)

## 2024-02-13 LAB — PROCALCITONIN: Procalcitonin: <0.1 ng/mL

## 2024-02-13 LAB — MRSA NEXT GEN BY PCR, NASAL: MRSA by PCR Next Gen: NOT DETECTED

## 2024-02-13 LAB — BRAIN NATRIURETIC PEPTIDE: B Natriuretic Peptide: 730.3 pg/mL — ABNORMAL HIGH (ref 0.0–100.0)

## 2024-02-13 MED ORDER — FLUTICASONE PROPIONATE 50 MCG/ACT NA SUSP: 2.0000 | Freq: Every day | NASAL | Status: DC | PRN

## 2024-02-13 MED ORDER — ONDANSETRON HCL 4 MG PO TABS: 4.0000 mg | ORAL_TABLET | Freq: Four times a day (QID) | ORAL | Status: DC | PRN

## 2024-02-13 MED ORDER — FUROSEMIDE 10 MG/ML IJ SOLN
40.0000 mg | Freq: Once | INTRAMUSCULAR | Status: AC
  Administered 2024-02-13: 40 mg via INTRAVENOUS
  Filled 2024-02-13: qty 4

## 2024-02-13 MED ORDER — ALENDRONATE SODIUM 70 MG PO TABS: 70.0000 mg | ORAL_TABLET | ORAL | Status: DC

## 2024-02-13 MED ORDER — PANTOPRAZOLE SODIUM 40 MG PO TBEC
40.0000 mg | DELAYED_RELEASE_TABLET | Freq: Every day | ORAL | Status: DC
  Administered 2024-02-13 – 2024-02-15 (×3): 40 mg via ORAL
  Filled 2024-02-13 (×3): qty 1

## 2024-02-13 MED ORDER — GABAPENTIN 400 MG PO CAPS
800.0000 mg | ORAL_CAPSULE | Freq: Four times a day (QID) | ORAL | Status: DC
  Administered 2024-02-13 – 2024-02-15 (×9): 800 mg via ORAL
  Filled 2024-02-13 (×9): qty 2

## 2024-02-13 MED ORDER — ROSUVASTATIN CALCIUM 20 MG PO TABS
40.0000 mg | ORAL_TABLET | Freq: Every day | ORAL | Status: DC
  Administered 2024-02-13 – 2024-02-14 (×2): 40 mg via ORAL
  Filled 2024-02-13 (×2): qty 2

## 2024-02-13 MED ORDER — ASPIRIN 81 MG PO CHEW
324.0000 mg | CHEWABLE_TABLET | Freq: Once | ORAL | Status: AC
  Administered 2024-02-13: 324 mg via ORAL
  Filled 2024-02-13: qty 4

## 2024-02-13 MED ORDER — ALLOPURINOL 100 MG PO TABS
100.0000 mg | ORAL_TABLET | Freq: Every day | ORAL | Status: DC
  Administered 2024-02-13 – 2024-02-15 (×3): 100 mg via ORAL
  Filled 2024-02-13 (×3): qty 1

## 2024-02-13 MED ORDER — FUROSEMIDE 10 MG/ML IJ SOLN
40.0000 mg | Freq: Every day | INTRAMUSCULAR | Status: DC
  Administered 2024-02-14 – 2024-02-15 (×2): 40 mg via INTRAVENOUS
  Filled 2024-02-13 (×2): qty 4

## 2024-02-13 MED ORDER — VANCOMYCIN HCL 2000 MG/400ML IV SOLN
2000.0000 mg | Freq: Once | INTRAVENOUS | Status: AC
Start: 2024-02-13 — End: 2024-02-13
  Administered 2024-02-13: 2000 mg via INTRAVENOUS
  Filled 2024-02-13: qty 400

## 2024-02-13 MED ORDER — ONDANSETRON HCL 4 MG/2ML IJ SOLN: 4.0000 mg | Freq: Four times a day (QID) | INTRAMUSCULAR | Status: DC | PRN

## 2024-02-13 MED ORDER — SODIUM CHLORIDE 0.9 % IV SOLN
2.0000 g | Freq: Once | INTRAVENOUS | Status: AC
  Administered 2024-02-13: 2 g via INTRAVENOUS
  Filled 2024-02-13: qty 12.5

## 2024-02-13 MED ORDER — LORATADINE 10 MG PO TABS
10.0000 mg | ORAL_TABLET | Freq: Every evening | ORAL | Status: DC
  Administered 2024-02-13 – 2024-02-14 (×2): 10 mg via ORAL
  Filled 2024-02-13 (×2): qty 1

## 2024-02-13 MED ORDER — APIXABAN 5 MG PO TABS
5.0000 mg | ORAL_TABLET | Freq: Two times a day (BID) | ORAL | Status: DC
  Administered 2024-02-13 – 2024-02-15 (×5): 5 mg via ORAL
  Filled 2024-02-13 (×5): qty 1

## 2024-02-13 MED ORDER — LIDOCAINE 5 % EX PTCH
1.0000 | MEDICATED_PATCH | CUTANEOUS | Status: DC
  Administered 2024-02-13 – 2024-02-14 (×2): 1 via TRANSDERMAL
  Filled 2024-02-13 (×2): qty 1

## 2024-02-13 MED ORDER — VANCOMYCIN HCL 1500 MG/300ML IV SOLN
1500.0000 mg | INTRAVENOUS | Status: DC
  Administered 2024-02-14: 1500 mg via INTRAVENOUS
  Filled 2024-02-13: qty 300

## 2024-02-13 MED ORDER — ASPIRIN 81 MG PO TBEC
81.0000 mg | DELAYED_RELEASE_TABLET | Freq: Every day | ORAL | Status: DC
  Administered 2024-02-14 – 2024-02-15 (×2): 81 mg via ORAL
  Filled 2024-02-13 (×2): qty 1

## 2024-02-13 MED ORDER — ACETAMINOPHEN 650 MG RE SUPP: 650.0000 mg | Freq: Four times a day (QID) | RECTAL | Status: DC | PRN

## 2024-02-13 MED ORDER — HYDRALAZINE HCL 25 MG PO TABS: 25.0000 mg | ORAL_TABLET | Freq: Three times a day (TID) | ORAL | Status: DC

## 2024-02-13 MED ORDER — SERTRALINE HCL 50 MG PO TABS
25.0000 mg | ORAL_TABLET | Freq: Three times a day (TID) | ORAL | Status: DC
  Administered 2024-02-13 – 2024-02-15 (×7): 25 mg via ORAL
  Filled 2024-02-13 (×7): qty 1

## 2024-02-13 MED ORDER — ACETAMINOPHEN 325 MG PO TABS
650.0000 mg | ORAL_TABLET | Freq: Four times a day (QID) | ORAL | Status: DC | PRN
  Administered 2024-02-13 – 2024-02-15 (×2): 650 mg via ORAL
  Filled 2024-02-13 (×2): qty 2

## 2024-02-13 MED ORDER — ISOSORBIDE MONONITRATE ER 30 MG PO TB24: 60.0000 mg | ORAL_TABLET | Freq: Every day | ORAL | Status: DC

## 2024-02-13 MED ORDER — POLYVINYL ALCOHOL 1.4 % OP SOLN: 1.0000 [drp] | Freq: Every day | OPHTHALMIC | Status: DC | PRN

## 2024-02-13 MED ORDER — SENNOSIDES-DOCUSATE SODIUM 8.6-50 MG PO TABS: 1.0000 | ORAL_TABLET | Freq: Every evening | ORAL | Status: DC | PRN

## 2024-02-13 MED ORDER — METOPROLOL SUCCINATE ER 25 MG PO TB24
25.0000 mg | ORAL_TABLET | Freq: Every day | ORAL | Status: DC
  Administered 2024-02-13: 25 mg via ORAL
  Filled 2024-02-13: qty 1

## 2024-02-13 MED ORDER — SODIUM CHLORIDE 0.9 % IV SOLN
2.0000 g | Freq: Two times a day (BID) | INTRAVENOUS | Status: DC
  Administered 2024-02-13 – 2024-02-14 (×2): 2 g via INTRAVENOUS
  Filled 2024-02-13 (×3): qty 12.5

## 2024-02-13 MED ORDER — LOSARTAN POTASSIUM 50 MG PO TABS
100.0000 mg | ORAL_TABLET | Freq: Every day | ORAL | Status: DC
  Administered 2024-02-13 – 2024-02-15 (×3): 100 mg via ORAL
  Filled 2024-02-13 (×3): qty 2

## 2024-02-13 NOTE — ED Notes (Signed)
 Pt ambulatory to toilet in rm with this tech by side. Pt able to urinate. Hand hygiene preformed. Pt ambulatory back to bed and placed back on cardiac monitor. Call light within reach.

## 2024-02-13 NOTE — ED Triage Notes (Signed)
 Pt presenting via EMS from home with intermittent L sided chest and epigastric pain that started on 5/10 around 1800. States pain got very severe tonight so she called EMS. AAOx4 on arrival. Denies pain at this time

## 2024-02-13 NOTE — Progress Notes (Signed)
 Pharmacy Antibiotic Note  Sarah Riddle is a 83 y.o. female admitted on 02/13/2024 with pneumonia.  Pharmacy has been consulted for vancomycin  and cefepime dosing.  -Hx: recently had a 2-day hospitalization for sepsis secondary to pneumonia about 3 weeks ago. discharged home with 7 days of Keflex   --WBC 5.9   afeb    PCT <0.10 -CXR 5/12:  Patchy opacities involving the left upper lobe, suspicious for infection/pneumonia. Additional patchy bibasilar opacities could reflect additional infiltrates or atelectasis.  Plan: -continue with Cefepime 2 gm IV q12h  for Crcl 57 ml/min  - Received 2000mg  Vancomycin  x 1 in ED Will continue with Vancomycin  1500 mg IV Q 24 hrs. Goal AUC 400-550. Expected AUC: 496 Cmin 11.9 SCr used: 0.92  Continue to assess renal fxn, cultures, length of therapy, etc    Height: 5\' 11"  (180.3 cm) Weight: 88.5 kg (195 lb) IBW/kg (Calculated) : 70.8  Temp (24hrs), Avg:97.7 F (36.5 C), Min:97.7 F (36.5 C), Max:97.7 F (36.5 C)  Recent Labs  Lab 02/13/24 0203  WBC 5.9  CREATININE 0.92    Estimated Creatinine Clearance: 57 mL/min (by C-G formula based on SCr of 0.92 mg/dL).    Allergies  Allergen Reactions   Iodinated Contrast Media Shortness Of Breath and Other (See Comments)    Can be premedicated  Have been able to tolerate if given premeds (benadryl , prednisone )   Ibuprofen Other (See Comments)    "stomach problems"    Sulfa Antibiotics Other (See Comments)    "Stomach problems"  "Stomach problems"  "Stomach problems"  "Stomach problems"   Doxycycline Diarrhea and Nausea And Vomiting   Tetracyclines & Related Nausea Only and Nausea And Vomiting    Antimicrobials this admission: Cefepime 5/12 >>   Vancomycin   5/12 >>    Dose adjustments this admission:    Microbiology results: 5/12 BCx: NG<12 hrs   UCx:      Sputum:    5/12 MRSA PCR: pending  Thank you for allowing pharmacy to be a part of this patient's care.  Thomasine Flick  PharmD Clinical Pharmacist 02/13/2024

## 2024-02-13 NOTE — H&P (Signed)
 History and Physical  Sarah Riddle ZOX:[id number] DOB: [birth year] DOA: 02/13/2024  PCP: Sarah Hove, MD   Chief Complaint: Shortness of breath, left-sided chest pain  HPI: Sarah Riddle is a 83 y.o. female with medical history significan for OSA on CPAP, chronic diastolic HF, CAD, PAF s/p ablation 2019 on apixaban , HTN, chronic  pain, and melanoma who presented to the ED for evaluation of shortness of breath and left-sided chest pain. Patient recently had a 2-day hospitalization for sepsis secondary to pneumonia about 3 weeks ago.  Reports she was discharged home with 7 days of Keflex . She followed up with her PCP and a repeat CXR showed improvement in her pneumonia. Two days ago, she started having left-sided chest pain described as crushing that resolved with nitroglycerin . The chest pain returned last night with associated shortness of breath so she presented to the ED for further evaluation.  She also reports some loose stools that were initially coffee-ground but now normal in color.  She endorsed mild cough and leg swelling but denies any fever, chills, headaches, palpitations, vision changes or syncope.  ED Course: Initial vitals show temp 97.7, RR 18, HR 75, BP 181/73, SpO2 94% on room air.  Labs significant for normal white count, stable Hgb at 9.5, normal CMP, troponin 18-17, BNP 730, procalcitonin <0.10, normal COVID, RSV and flu test.  UA with no signs of infection.  EKG shows sinus rhythm with prolonged QTc of 495. CXR shows patchy opacities in the left upper lungs concerning for pneumonia and mild pulmonary interstitial edema with cardiomegaly.  Patient received aspirin  325 mg x 1, IV Lasix  40 mg x 1, IV vancomycin  and cefepime. TRH was consulted for admission.  Review of Systems: Please see HPI for pertinent positives and negatives. A complete 10 system review of systems are otherwise negative.  Past Medical History:  Diagnosis Date   A-fib [facility])    Accelerated hypertension  10/31/2022   Age related osteoporosis    Cancer (HCC)    Melanoma x 2 -- Lt Leg -1976 Rt Arm - 2014   Chronic bilateral low back pain    Chronic kidney disease    Complication of anesthesia    DVT (deep venous thrombosis) (HCC) 1960   right leg  was on BC pills   Dysrhythmia    Family history of breast cancer    Family history of colon cancer    Family history of melanoma    Family history of prostate cancer    Fibrocystic breast disease    GERD (gastroesophageal reflux disease)    Headache    migraine   Heart murmur    not heard now.   History of kidney stones    Hyperlipidemia    Hypertension    IBS (irritable bowel syndrome)    Lumbago    Mitral valve prolapse    Neuropathy    Obstructive sleep apnea    Osteoarthritis of shoulder    PONV (postoperative nausea and vomiting)    Past Surgical History:  Procedure Laterality Date   ABDOMINAL HYSTERECTOMY     BACK SURGERY     BREAST BIOPSY Left 04/20/2023   us  bx/ heart clip/ path pending   BREAST BIOPSY Left 04/20/2023   US  LT BREAST BX W LOC DEV 1ST LESION IMG BX SPEC US  GUIDE 04/20/2023 [facility]   BREAST CYST ASPIRATION Left 2001   Negative   CARDIAC ELECTROPHYSIOLOGY MAPPING AND ABLATION     EYE SURGERY  HUMERUS IM NAIL Right 05/05/2022   Procedure: INTRAMEDULLARY (IM) NAIL HUMERAL;  Surgeon: Laneta Pintos, MD;  Location: [facility] OR;  Service: Orthopedics;  Laterality: Right;   HYSTEROTOMY     L4-L5  2020   titaum rebok cage   LEFT HEART CATH AND CORONARY ANGIOGRAPHY N/A 11/01/2022   Procedure: LEFT HEART CATH AND CORONARY ANGIOGRAPHY and possible PCI and stent;  Surgeon: Cherrie Cornwall, MD;  Location: [facility] INVASIVE CV LAB;  Service: Cardiovascular;  Laterality: N/A;   TOTAL HIP ARTHROPLASTY Left    Social History:  reports that she has never smoked. She has never used smokeless tobacco. She reports that she does not drink alcohol and does not use drugs.  Allergies  Allergen Reactions   Iodinated  Contrast Media Shortness Of Breath and Other (See Comments)    Can be premedicated  Have been able to tolerate if given premeds (benadryl , prednisone )   Ibuprofen Other (See Comments)    "stomach problems"    Sulfa Antibiotics Other (See Comments)    "Stomach problems"  "Stomach problems"  "Stomach problems"  "Stomach problems"   Doxycycline Diarrhea and Nausea And Vomiting   Tetracyclines & Related Nausea Only and Nausea And Vomiting    Family History  Problem Relation Age of Onset   Colon cancer Mother 28   Prostate cancer Father        dx 46s   Cancer Sister    Acute myelogenous leukemia Sister        dx 59s   Prostate cancer Brother    Melanoma Brother    Prostate cancer Brother    Melanoma Brother    HIV/AIDS Brother 59   Tuberculosis Paternal Aunt    Melanoma Other    Breast cancer Other    Melanoma Niece      Prior to Admission medications   Medication Sig Start Date End Date Taking? Authorizing Provider  acetaminophen  (TYLENOL ) 500 MG tablet Take 500 mg by mouth every 6 (six) hours as needed for moderate pain (pain score 4-6) or mild pain (pain score 1-3). Once a day   Yes [provider]  alendronate  (FOSAMAX ) 70 MG tablet Take 1 tablet (70 mg total) by mouth every Thursday. Take with a full glass of water on an empty stomach. Patient taking differently: Take 70 mg by mouth every Tuesday. Take with a full glass of water on an empty stomach. 07/14/23  Yes Sarah Hove, MD  allopurinol  (ZYLOPRIM ) 100 MG tablet Take 1 tablet (100 mg total) by mouth daily. 09/30/23  Yes Sarah Hove, MD  apixaban  (ELIQUIS ) 5 MG TABS tablet Take 1 tablet (5 mg total) by mouth 2 (two) times daily. 10/13/23  Yes Sarah Hove, MD  aspirin  EC 81 MG tablet Take 1 tablet (81 mg total) by mouth daily. Swallow whole. 01/28/24 02/27/24 Yes Ree Candy, MD  azelastine  (ASTELIN ) 0.1 % nasal spray Place 2 sprays into both nostrils at bedtime. Use in each nostril as  directed Patient taking differently: Place 2 sprays into both nostrils daily as needed for allergies or rhinitis. Use in each nostril as directed 07/14/23  Yes Sarah Hove, MD  cyanocobalamin  (VITAMIN B12) 1000 MCG tablet Take 1,000 mcg by mouth daily. Monday to Friday   Yes [provider]  EPINEPHrine  0.3 mg/0.3 mL IJ SOAJ injection Inject 0.3 mg into the muscle as needed for anaphylaxis. 06/02/21  Yes [provider]  fluticasone  (FLONASE ) 50 MCG/ACT nasal spray Place 2 sprays  into both nostrils daily as needed for allergies or rhinitis. 07/31/17  Yes [provider]  furosemide  (LASIX ) 20 MG tablet Take 1 tablet (20 mg total) by mouth 2 (two) times daily as needed for fluid or edema. 02/09/24  Yes Scoggins, Hospital doctor, NP  gabapentin  (NEURONTIN ) 800 MG tablet TAKE 1 TABLET FOUR TIMES DAILY 03/17/23  Yes Trenda Frisk, FNP  hydrALAZINE  (APRESOLINE ) 25 MG tablet Take 25 mg by mouth 3 (three) times daily.   Yes [provider]  isosorbide  mononitrate (IMDUR ) 60 MG 24 hr tablet Take 60 mg by mouth daily.   Yes [provider]  levocetirizine (XYZAL ) 5 MG tablet TAKE 1 TABLET EVERY EVENING 12/27/23  Yes Sarah Hove, MD  losartan  (COZAAR ) 100 MG tablet Take 1 tablet (100 mg total) by mouth daily. 01/28/24 03/28/24 Yes Ree Candy, MD  meclizine  (ANTIVERT ) 12.5 MG tablet Take 1 tablet (12.5 mg total) by mouth 3 (three) times daily as needed for dizziness. 01/31/23  Yes Boswell, Chelsa, NP  metoprolol  succinate (TOPROL -XL) 25 MG 24 hr tablet TAKE 1 TABLET EVERY DAY Patient taking differently: Take 25 mg by mouth at bedtime. 03/08/23  Yes Cherrie Cornwall, MD  nitroGLYCERIN  (NITROSTAT ) 0.4 MG SL tablet Place 1 tablet (0.4 mg total) under the tongue every 5 (five) minutes as needed for chest pain. 02/03/24  Yes Sarah Hove, MD  pantoprazole  (PROTONIX ) 40 MG tablet TAKE 1 TABLET EVERY DAY 01/05/24  Yes Cherrie Cornwall, MD  Polyethyl Glyc-Propyl Glyc PF (SYSTANE  HYDRATION PF) 0.4-0.3 % SOLN Apply 1 drop to eye daily as needed (dry eyes).   Yes [provider]  rosuvastatin  (CRESTOR ) 40 MG tablet TAKE 1 TABLET EVERY DAY Patient taking differently: Take 40 mg by mouth at bedtime. 08/16/23  Yes Cherrie Cornwall, MD  sertraline  (ZOLOFT ) 25 MG tablet TAKE 1 TABLET THREE TIMES DAILY 01/19/24  Yes Sarah Hove, MD  Vitamin D , Ergocalciferol , 50000 units CAPS Take 1 capsule by mouth once a week. 10/10/23  Yes Sarah Hove, MD  OXYGEN  Inhale 2 L/min into the lungs at bedtime. And as needed for SOB    [provider]    Physical Exam: BP (!) 178/78 (BP Location: Left Arm)   Pulse 63   Temp 97.7 F (36.5 C) (Oral)   Resp 20   Ht 5\' 11"  (1.803 m)   Wt 88.5 kg   SpO2 98%   BMI 27.20 kg/m  General: Pleasant, well-appearing elderly woman laying in bed. No acute distress. HEENT: [place]/AT. Anicteric sclera CV: RRR. No murmurs, rubs, or gallops. Trace BLE edema up to lower legs. Pulmonary: Lungs CTAB. Normal effort. Distant bibasilar rales. Abdominal: Soft, nontender, nondistended. Normal bowel sounds. Extremities: Palpable radial and DP pulses. Normal ROM. Skin: Warm and dry. No obvious rash or lesions. Neuro: A&Ox3. Moves all extremities. Normal sensation to light touch. No focal deficit. Psych: Normal mood and affect          Labs on Admission:  Basic Metabolic Panel: Recent Labs  Lab 02/13/24 0203  NA 141  K 4.4  CL 106  CO2 29  GLUCOSE 98  BUN 12  CREATININE 0.92  CALCIUM  9.2  MG 2.0   Liver Function Tests: Recent Labs  Lab 02/13/24 0203  AST 37  ALT 21  ALKPHOS 63  BILITOT 0.7  PROT 6.0*  ALBUMIN 3.1*   Recent Labs  Lab 02/13/24 0203  LIPASE 28   No results for input(s): "AMMONIA" in  the last 168 hours. CBC: Recent Labs  Lab 02/13/24 0203  WBC 5.9  NEUTROABS 2.3  HGB 9.8*  HCT 32.4*  MCV 93.1  PLT 293   Cardiac Enzymes: No results for input(s): "CKTOTAL", "CKMB", "CKMBINDEX", "TROPONINI" in the  last 168 hours. BNP (last 3 results) Recent Labs    01/25/24 0112 02/13/24 0203  BNP 522.6* 730.3*    ProBNP (last 3 results) No results for input(s): "PROBNP" in the last 8760 hours.  CBG: No results for input(s): "GLUCAP" in the last 168 hours.  Radiological Exams on Admission: DG Chest Portable 1 View Result Date: 02/13/2024 CLINICAL DATA:  Initial evaluation for acute chest pain. EXAM: PORTABLE CHEST 1 VIEW COMPARISON:  Prior radiograph from 02/09/2024. FINDINGS: Cardiomegaly. Mediastinal silhouette within normal limits. Aortic atherosclerosis. Lungs mildly hypoinflated. Small layering bilateral pleural effusions. Perihilar vascular congestion with mild pulmonary interstitial congestion. Patchy opacities involving the left upper lobe, slightly more prominent from prior. Finding is suspicious for infection/pneumonia. Few additional patchy opacities involving the left greater than right lung base could reflect additional infiltrates or atelectasis. No pneumothorax. Advanced osteoarthritic changes noted about the left shoulder. Sequelae of prior ORIF partially visualized at the right humerus. IMPRESSION: 1. Patchy opacities involving the left upper lobe, suspicious for infection/pneumonia. Additional patchy bibasilar opacities could reflect additional infiltrates or atelectasis. 2. Cardiomegaly with mild pulmonary interstitial congestion and small layering bilateral pleural effusions. 3.  Aortic Atherosclerosis (ICD10-I70.0). Electronically Signed   By: Virgia Griffins M.D.   On: 02/13/2024 03:38   Assessment/Plan Sarah Riddle is a 83 y.o. female with medical history significant for OSA on CPAP, chronic diastolic HF, CAD, PAF s/p ablation 2019 on apixaban , HTN, chronic  pain, and melanoma who presented to the ED for evaluation of shortness of breath and left-sided chest pain admitted for hospital-acquired pneumonia and acute on chronic heart failure.  # Hospital-acquired pneumonia -  Recent admission for sepsis secondary to pneumonia about 3 weeks ago. - Presented today with left-sided chest pain, shortness of breath and cough - CXR showing patchy opacities in the left upper lung lobe - Patient afebrile with no leukocytosis, stable on room air. - Continue IV vancomycin  and cefepime - Follow-up blood and sputum culture - Check full respiratory panel, urinary Legionella, urinary strep and MRSA screen - Incentive spirometer, flutter valve - Trend CBC and fever curve  # Acute on chronic diastolic heart failure - Presented with chest pain that has not resolved, with associated shortness of breath. - Has clinical, radiological and laboratory evidence of CHF exacerbation - TTE on 01/25/2024 showed EF 60-65%, grade 3 diastolic dysfunction but no valvular pathology. - Only taking Lasix  20 mg BID as needed for edema at home - IV Lasix  40 mg daily - Strict I&O's, daily weight - Trend and replete electrolytes  # Diarrhea - Reports having multiple loose stools after discharge from the hospital on April 25. - Loose stools have improved after she took Imodium few days ago, no bloody stools or melena. - Follow-up C. difficile screen and GI panel - Enteric precautions  # Paroxysmal A-fib - S/p ablation in 2019 - EKG on admission shows sinus rhythm - HR stable 55-75 - Continue Eliquis  and Toprol -XL  # HTN - BP elevated with SBP in the 150s to 170s - Continue losartan  and Toprol  XL  # HLD # CAD - Continue aspirin  and rosuvastatin   # OSA with CPAP - Continue CPAP at bedtime  # Neuropathy - Continue gabapentin   # GERD -  Continue Protonix   # Gout - Continue allopurinol   DVT prophylaxis: Eliquis     Code Status: Full Code  Consults called: None  Family Communication: No family at bedside  Severity of Illness: The appropriate patient status for this patient is INPATIENT. Inpatient status is judged to be reasonable and necessary in order to provide the required  intensity of service to ensure the patient's safety. The patient's presenting symptoms, physical exam findings, and initial radiographic and laboratory data in the context of their chronic comorbidities is felt to place them at high risk for further clinical deterioration. Furthermore, it is not anticipated that the patient will be medically stable for discharge from the hospital within 2 midnights of admission.   * I certify that at the point of admission it is my clinical judgment that the patient will require inpatient hospital care spanning beyond 2 midnights from the point of admission due to high intensity of service, high risk for further deterioration and high frequency of surveillance required.*  Level of care: Telemetry Medical   This record has been created using Conservation officer, historic buildings. Errors have been sought and corrected, but may not always be located. Such creation errors do not reflect on the standard of care.   Vita Grip, MD 02/13/2024, 9:25 AM [facility] Pager: [phone] Isaiah 41:10   If 7PM-7AM, please contact night-coverage [email or web address] Password [email or web address]

## 2024-02-13 NOTE — Plan of Care (Signed)
  Problem: Education: Goal: Knowledge of General Education information will improve Description: Including pain rating scale, medication(s)/side effects and non-pharmacologic comfort measures Outcome: Progressing   Problem: Health Behavior/Discharge Planning: Goal: Ability to manage health-related needs will improve Outcome: Progressing   Problem: Clinical Measurements: Goal: Ability to maintain clinical measurements within normal limits will improve Outcome: Progressing Goal: Cardiovascular complication will be avoided Outcome: Progressing   Problem: Activity: Goal: Risk for activity intolerance will decrease Outcome: Progressing   Problem: Nutrition: Goal: Adequate nutrition will be maintained Outcome: Progressing   Problem: Coping: Goal: Level of anxiety will decrease Outcome: Progressing   Problem: Elimination: Goal: Will not experience complications related to bowel motility Outcome: Progressing Goal: Will not experience complications related to urinary retention Outcome: Progressing   Problem: Pain Managment: Goal: General experience of comfort will improve and/or be controlled Outcome: Progressing   Problem: Safety: Goal: Ability to remain free from injury will improve Outcome: Progressing   Problem: Skin Integrity: Goal: Risk for impaired skin integrity will decrease Outcome: Progressing

## 2024-02-13 NOTE — ED Notes (Signed)
 Assisted pt to bathroom and back to bed. Denies further needs at this time

## 2024-02-13 NOTE — ED Provider Notes (Addendum)
 [facility] Provider Note    Event Date/Time   First MD Initiated Contact with Patient 02/13/24 0147     (approximate)   History   Chest Pain   HPI  Sarah Riddle is a 83 y.o. female with history of atrial fibrillation on Eliquis , hypertension, hyperlipidemia, DVT who presents to the emergency department complaints of left-sided chest pressure, shortness of breath that resolved after taking nitroglycerin  at home.  She also reports 2 days ago having epigastric abdominal pain that felt like a "crushing" that has resolved.  No vomiting.  Recently admitted to the hospital for pneumonia and bacteremia.  States since discharge on April 25 she has had diarrhea that has been unchanged.  No bloody stools or melena.  She denies any pain currently.  No calf tenderness or calf swelling.  Still having some mild residual cough but no fevers.  Reports she had a chest x-ray 2 to 3 days ago but does not yet have the results back.   History provided by patient, family, EMS.    Past Medical History:  Diagnosis Date   A-fib [facility])    Accelerated hypertension 10/31/2022   Age related osteoporosis    Cancer (HCC)    Melanoma x 2 -- Lt Leg -1976 Rt Arm - 2014   Chronic bilateral low back pain    Chronic kidney disease    Complication of anesthesia    DVT (deep venous thrombosis) (HCC) 1960   right leg  was on BC pills   Dysrhythmia    Family history of breast cancer    Family history of colon cancer    Family history of melanoma    Family history of prostate cancer    Fibrocystic breast disease    GERD (gastroesophageal reflux disease)    Headache    migraine   Heart murmur    not heard now.   History of kidney stones    Hyperlipidemia    Hypertension    IBS (irritable bowel syndrome)    Lumbago    Mitral valve prolapse    Neuropathy    Obstructive sleep apnea    Osteoarthritis of shoulder    PONV (postoperative nausea and vomiting)     Past Surgical  History:  Procedure Laterality Date   ABDOMINAL HYSTERECTOMY     BACK SURGERY     BREAST BIOPSY Left 04/20/2023   us  bx/ heart clip/ path pending   BREAST BIOPSY Left 04/20/2023   US  LT BREAST BX W LOC DEV 1ST LESION IMG BX SPEC US  GUIDE 04/20/2023 [facility]   BREAST CYST ASPIRATION Left 2001   Negative   CARDIAC ELECTROPHYSIOLOGY MAPPING AND ABLATION     EYE SURGERY     HUMERUS IM NAIL Right 05/05/2022   Procedure: INTRAMEDULLARY (IM) NAIL HUMERAL;  Surgeon: Laneta Pintos, MD;  Location: [facility] OR;  Service: Orthopedics;  Laterality: Right;   HYSTEROTOMY     L4-L5  2020   titaum rebok cage   LEFT HEART CATH AND CORONARY ANGIOGRAPHY N/A 11/01/2022   Procedure: LEFT HEART CATH AND CORONARY ANGIOGRAPHY and possible PCI and stent;  Surgeon: Cherrie Cornwall, MD;  Location: [facility] INVASIVE CV LAB;  Service: Cardiovascular;  Laterality: N/A;   TOTAL HIP ARTHROPLASTY Left     MEDICATIONS:  Prior to Admission medications   Medication Sig Start Date End Date Taking? Authorizing Provider  acetaminophen  (TYLENOL ) 500 MG tablet Take 500 mg by mouth every 6 (six) hours as needed for  moderate pain (pain score 4-6) or mild pain (pain score 1-3). Once a day    [provider]  alendronate  (FOSAMAX ) 70 MG tablet Take 1 tablet (70 mg total) by mouth every Thursday. Take with a full glass of water on an empty stomach. Patient taking differently: Take 70 mg by mouth every Tuesday. Take with a full glass of water on an empty stomach. 07/14/23   Aisha Hove, MD  allopurinol  (ZYLOPRIM ) 100 MG tablet Take 1 tablet (100 mg total) by mouth daily. 09/30/23   Aisha Hove, MD  apixaban  (ELIQUIS ) 5 MG TABS tablet Take 1 tablet (5 mg total) by mouth 2 (two) times daily. 10/13/23   Aisha Hove, MD  aspirin  EC 81 MG tablet Take 1 tablet (81 mg total) by mouth daily. Swallow whole. 01/28/24 02/27/24  Ree Candy, MD  azelastine  (ASTELIN ) 0.1 % nasal spray Place 2 sprays into both nostrils at  bedtime. Use in each nostril as directed Patient taking differently: Place 2 sprays into both nostrils daily as needed for allergies or rhinitis. Use in each nostril as directed 07/14/23   Aisha Hove, MD  cyanocobalamin  (VITAMIN B12) 1000 MCG tablet Take 1,000 mcg by mouth daily. Monday to Friday    [provider]  EPINEPHrine  0.3 mg/0.3 mL IJ SOAJ injection Inject 0.3 mg into the muscle as needed for anaphylaxis. 06/02/21   [provider]  fluticasone  (FLONASE ) 50 MCG/ACT nasal spray Place 2 sprays into both nostrils daily as needed for allergies or rhinitis. 07/31/17   [provider]  furosemide  (LASIX ) 20 MG tablet Take 1 tablet (20 mg total) by mouth 2 (two) times daily as needed for fluid or edema. 02/09/24   Scoggins, Amber, NP  gabapentin  (NEURONTIN ) 800 MG tablet TAKE 1 TABLET FOUR TIMES DAILY 03/17/23   Trenda Frisk, FNP  levocetirizine (XYZAL ) 5 MG tablet TAKE 1 TABLET EVERY EVENING 12/27/23   Aisha Hove, MD  losartan  (COZAAR ) 100 MG tablet Take 1 tablet (100 mg total) by mouth daily. 01/28/24 03/28/24  Ree Candy, MD  meclizine  (ANTIVERT ) 12.5 MG tablet Take 1 tablet (12.5 mg total) by mouth 3 (three) times daily as needed for dizziness. 01/31/23   Boswell, Chelsa, NP  metoprolol  succinate (TOPROL -XL) 25 MG 24 hr tablet TAKE 1 TABLET EVERY DAY Patient taking differently: Take 25 mg by mouth at bedtime. 03/08/23   Cherrie Cornwall, MD  nitroGLYCERIN  (NITROSTAT ) 0.4 MG SL tablet Place 1 tablet (0.4 mg total) under the tongue every 5 (five) minutes as needed for chest pain. 02/03/24   Aisha Hove, MD  OXYGEN  Inhale 2 L/min into the lungs at bedtime. And as needed for SOB    [provider]  pantoprazole  (PROTONIX ) 40 MG tablet TAKE 1 TABLET EVERY DAY 01/05/24   Cherrie Cornwall, MD  Polyethyl Glyc-Propyl Glyc PF (SYSTANE HYDRATION PF) 0.4-0.3 % SOLN Apply 1 drop to eye daily as needed (dry eyes).    [provider]  rosuvastatin   (CRESTOR ) 40 MG tablet TAKE 1 TABLET EVERY DAY Patient taking differently: Take 40 mg by mouth at bedtime. 08/16/23   Cherrie Cornwall, MD  sertraline  (ZOLOFT ) 25 MG tablet TAKE 1 TABLET THREE TIMES DAILY 01/19/24   Aisha Hove, MD  Vitamin D , Ergocalciferol , 50000 units CAPS Take 1 capsule by mouth once a week. 10/10/23   Aisha Hove, MD    Physical Exam   Triage Vital Signs: ED Triage Vitals  Encounter Vitals Group     BP 02/13/24 0201 (!) 181/73     Systolic BP Percentile --      Diastolic BP Percentile --      Pulse Rate 02/13/24 0154 75     Resp 02/13/24 0154 18     Temp 02/13/24 0154 97.7 F (36.5 C)     Temp Source 02/13/24 0154 Oral     SpO2 02/13/24 0154 96 %     Weight 02/13/24 0157 195 lb (88.5 kg)     Height 02/13/24 0157 5\' 11"  (1.803 m)     Head Circumference --      Peak Flow --      Pain Score 02/13/24 0157 0     Pain Loc --      Pain Education --      Exclude from Growth Chart --     Most recent vital signs: Vitals:   02/13/24 0300 02/13/24 0330  BP: (!) 157/68 (!) 159/63  Pulse: (!) 57 (!) 55  Resp: 10 11  Temp:    SpO2: 97% 96%    CONSTITUTIONAL: Alert, responds appropriately to questions. Well-appearing; well-nourished, elderly HEAD: Normocephalic, atraumatic EYES: Conjunctivae clear, pupils appear equal, sclera nonicteric ENT: normal nose; moist mucous membranes NECK: Supple, normal ROM CARD: RRR; S1 and S2 appreciated RESP: Normal chest excursion without splinting or tachypnea; breath sounds clear and equal bilaterally; no wheezes, no rhonchi, no rales, no hypoxia or respiratory distress, speaking full sentences ABD/GI: Non-distended; soft, non-tender, no rebound, no guarding, no peritoneal signs BACK: The back appears normal EXT: Normal ROM in all joints; no deformity noted, no edema, no calf tenderness or calf swelling SKIN: Normal color for age and race; warm; no rash on exposed skin NEURO: Moves all extremities equally, normal  speech PSYCH: The patient's mood and manner are appropriate.   ED Results / Procedures / Treatments   LABS: (all labs ordered are listed, but only abnormal results are displayed) Labs Reviewed  CBC WITH DIFFERENTIAL/PLATELET - Abnormal; Notable for the following components:      Result Value   RBC 3.48 (*)    Hemoglobin 9.8 (*)    HCT 32.4 (*)    Eosinophils Absolute 0.6 (*)    All other components within normal limits  COMPREHENSIVE METABOLIC PANEL WITH GFR - Abnormal; Notable for the following components:   Total Protein 6.0 (*)    Albumin 3.1 (*)    All other components within normal limits  URINALYSIS, ROUTINE W REFLEX MICROSCOPIC - Abnormal; Notable for the following components:   Color, Urine YELLOW (*)    APPearance CLEAR (*)    Leukocytes,Ua TRACE (*)    All other components within normal limits  BRAIN NATRIURETIC PEPTIDE - Abnormal; Notable for the following components:   B Natriuretic Peptide 730.3 (*)    All other components within normal limits  TROPONIN I (HIGH SENSITIVITY) - Abnormal; Notable for the following components:   Troponin I (High Sensitivity) 18 (*)    All other components within normal limits  RESP PANEL BY RT-PCR (RSV, FLU A&B, COVID)  [id number]  GASTROINTESTINAL PANEL BY PCR, STOOL (REPLACES STOOL CULTURE)  C DIFFICILE QUICK SCREEN W PCR REFLEX    CULTURE, BLOOD (ROUTINE X 2)  CULTURE, BLOOD (ROUTINE X 2)  LIPASE, BLOOD  MAGNESIUM  PROCALCITONIN  OCCULT BLOOD X 1 CARD TO LAB, STOOL  TROPONIN I (HIGH SENSITIVITY)     EKG:  EKG Interpretation Date/Time:  Monday Feb 13 2024 01:55:54 EDT  Ventricular Rate:  71 PR Interval:  156 QRS Duration:  90 QT Interval:  455 QTC Calculation: 495 R Axis:   48  Text Interpretation: Sinus rhythm RSR' in V1 or V2, probably normal variant Borderline prolonged QT interval Confirmed by Verneda Golder [phone]) on 02/13/2024 2:34:52 AM         RADIOLOGY: My personal review and interpretation of imaging: Chest  x-ray shows left-sided pneumonia.  I have personally reviewed all radiology reports.   DG Chest Portable 1 View Result Date: 02/13/2024 CLINICAL DATA:  Initial evaluation for acute chest pain. EXAM: PORTABLE CHEST 1 VIEW COMPARISON:  Prior radiograph from 02/09/2024. FINDINGS: Cardiomegaly. Mediastinal silhouette within normal limits. Aortic atherosclerosis. Lungs mildly hypoinflated. Small layering bilateral pleural effusions. Perihilar vascular congestion with mild pulmonary interstitial congestion. Patchy opacities involving the left upper lobe, slightly more prominent from prior. Finding is suspicious for infection/pneumonia. Few additional patchy opacities involving the left greater than right lung base could reflect additional infiltrates or atelectasis. No pneumothorax. Advanced osteoarthritic changes noted about the left shoulder. Sequelae of prior ORIF partially visualized at the right humerus. IMPRESSION: 1. Patchy opacities involving the left upper lobe, suspicious for infection/pneumonia. Additional patchy bibasilar opacities could reflect additional infiltrates or atelectasis. 2. Cardiomegaly with mild pulmonary interstitial congestion and small layering bilateral pleural effusions. 3.  Aortic Atherosclerosis (ICD10-I70.0). Electronically Signed   By: Virgia Griffins M.D.   On: 02/13/2024 03:38     PROCEDURES:  Critical Care performed: No      .1-3 Lead EKG Interpretation  Performed by: Shailene Demonbreun, Clover Dao, DO Authorized by: Jalaila Caradonna, Clover Dao, DO     Interpretation: normal     ECG rate:  75   ECG rate assessment: normal     Rhythm: sinus rhythm     Ectopy: none     Conduction: normal       IMPRESSION / MDM / ASSESSMENT AND PLAN / ED COURSE  I reviewed the triage vital signs and the nursing notes.    Patient here with chest pain, shortness of breath that has resolved.  Also had upper abdominal pain 2 days ago.  He is having consistent diarrhea since recent discharge from  the hospital 2 weeks ago without bloody stool, melena.  The patient is on the cardiac monitor to evaluate for evidence of arrhythmia and/or significant heart rate changes.   DIFFERENTIAL DIAGNOSIS (includes but not limited to):   ACS, PE, pneumonia, CHF, pneumothorax, GERD, cholecystitis, cholangitis, choledocholithiasis, cholelithiasis, pancreatitis, gastritis, peptic ulcer   Patient's presentation is most consistent with acute presentation with potential threat to life or bodily function.   PLAN: Will obtain labs, chest x-ray.  EKG nonischemic.  Patient currently asymptomatic.  Will place on cardiac monitoring.  PE is on the differential given recent hospitalization and prior history of DVT but patient is on Eliquis  for A-fib and is asymptomatic without tachycardia, tachypnea or hypoxia which makes me feel like this is less likely.  Will also send stool samples given patient reports diarrhea since recent discharge from the hospital and recent antibiotic use.  Abdominal exam today is benign.   MEDICATIONS GIVEN IN ED: Medications  vancomycin  (VANCOREADY) IVPB 2000 mg/400 mL (has no administration in time range)  furosemide  (LASIX ) injection 40 mg (has no administration in time range)  aspirin  chewable tablet 324 mg (has no administration in time range)  ceFEPIme (MAXIPIME) 2 g in sodium chloride  0.9 % 100 mL IVPB (2 g Intravenous New Bag/Given 02/13/24 0413)  ED COURSE: Hemoglobin is 9.8 which is stable compared to recent.  Normal electrolytes.  First troponin 18.  Second pending.  Normal LFTs, lipase.  Urine shows no sign of infection.  Chest x-ray reviewed and interpreted by myself and the radiologist and shows patchy opacities in the left upper lobe concerning for pneumonia as well as bibasilar opacities that could reflect additional infiltrates or atelectasis.  She does report having a nonproductive cough.  Will give cefepime and vancomycin  for broad coverage given recent sepsis and  will obtain blood cultures.  Chest x-ray also appears to show interstitial congestion and bilateral pleural effusions.  BNP is greater than 700.  Will give IV Lasix .  5:05 AM  COVID, flu and RSV negative.  Second troponin 17.  Have recommended admission for further monitoring.  Patient and family comfortable with this plan.   CONSULTS:  Consulted and discussed patient's case with hospitalist, Dr. Vallarie Gauze.  I have recommended admission and consulting physician agrees and will place admission orders.  Patient (and family if present) agree with this plan.   I reviewed all nursing notes, vitals, pertinent previous records.  All labs, EKGs, imaging ordered have been independently reviewed and interpreted by myself.    OUTSIDE RECORDS REVIEWED: Reviewed recent hospitalization.       FINAL CLINICAL IMPRESSION(S) / ED DIAGNOSES   Final diagnoses:  Nonspecific chest pain  HCAP (healthcare-associated pneumonia)  Pleural effusion, bilateral     Rx / DC Orders   ED Discharge Orders     None        Note:  This document was prepared using Dragon voice recognition software and may include unintentional dictation errors.   Ameen Mostafa, Clover Dao, DO 02/13/24 0449    Brodie Correll, Clover Dao, DO 02/13/24 4098

## 2024-02-14 DIAGNOSIS — J189 Pneumonia, unspecified organism: Secondary | ICD-10-CM | POA: Diagnosis not present

## 2024-02-14 LAB — CBC
HCT: 30.7 % — ABNORMAL LOW (ref 36.0–46.0)
Hemoglobin: 9.6 g/dL — ABNORMAL LOW (ref 12.0–15.0)
MCH: 28.5 pg (ref 26.0–34.0)
MCHC: 31.3 g/dL (ref 30.0–36.0)
MCV: 91.1 fL (ref 80.0–100.0)
Platelets: 262 10*3/uL (ref 150–400)
RBC: 3.37 MIL/uL — ABNORMAL LOW (ref 3.87–5.11)
RDW: 13.5 % (ref 11.5–15.5)
WBC: 5.8 10*3/uL (ref 4.0–10.5)
nRBC: 0 % (ref 0.0–0.2)

## 2024-02-14 LAB — BASIC METABOLIC PANEL WITH GFR
Anion gap: 8 (ref 5–15)
BUN: 15 mg/dL (ref 8–23)
CO2: 28 mmol/L (ref 22–32)
Calcium: 9 mg/dL (ref 8.9–10.3)
Chloride: 105 mmol/L (ref 98–111)
Creatinine, Ser: 0.84 mg/dL (ref 0.44–1.00)
GFR, Estimated: >60 mL/min (ref >60)
Glucose, Bld: 88 mg/dL (ref 70–99)
Potassium: 4.1 mmol/L (ref 3.5–5.1)
Sodium: 141 mmol/L (ref 135–145)

## 2024-02-14 LAB — LEGIONELLA PNEUMOPHILA SEROGP 1 UR AG: L. pneumophila Serogp 1 Ur Ag: NEGATIVE

## 2024-02-14 MED ORDER — METOPROLOL SUCCINATE ER 25 MG PO TB24: 25.0000 mg | ORAL_TABLET | Freq: Every day | ORAL | Status: DC

## 2024-02-14 MED ORDER — VITAMIN B-12 1000 MCG PO TABS
500.0000 ug | ORAL_TABLET | Freq: Every day | ORAL | Status: DC
  Administered 2024-02-14 – 2024-02-15 (×2): 500 ug via ORAL
  Filled 2024-02-14 (×2): qty 1

## 2024-02-14 MED ORDER — OXYCODONE HCL 5 MG PO TABS
5.0000 mg | ORAL_TABLET | Freq: Four times a day (QID) | ORAL | Status: DC | PRN
  Administered 2024-02-14: 5 mg via ORAL
  Filled 2024-02-14: qty 1

## 2024-02-14 MED ORDER — GUAIFENESIN ER 600 MG PO TB12
600.0000 mg | ORAL_TABLET | Freq: Two times a day (BID) | ORAL | Status: DC
  Administered 2024-02-14 – 2024-02-15 (×3): 600 mg via ORAL
  Filled 2024-02-14 (×3): qty 1

## 2024-02-14 MED ORDER — SODIUM CHLORIDE 0.9 % IV SOLN
2.0000 g | Freq: Three times a day (TID) | INTRAVENOUS | Status: DC
  Administered 2024-02-14 – 2024-02-15 (×3): 2 g via INTRAVENOUS
  Filled 2024-02-14 (×4): qty 12.5

## 2024-02-14 MED ORDER — MORPHINE SULFATE (PF) 2 MG/ML IV SOLN: 2.0000 mg | INTRAVENOUS | Status: DC | PRN

## 2024-02-14 MED ORDER — DICLOFENAC SODIUM 1 % EX GEL
2.0000 g | Freq: Three times a day (TID) | CUTANEOUS | Status: DC
Start: 2024-02-14 — End: 2024-02-15
  Administered 2024-02-14 – 2024-02-15 (×2): 2 g via TOPICAL
  Filled 2024-02-14: qty 100

## 2024-02-14 NOTE — Progress Notes (Signed)
 [facility] Progress Note  Patient: Sarah Riddle    ONG:[id number]  DOA: 02/13/2024     Date of Service: the patient was seen and examined on 02/14/2024  Chief Complaint  Patient presents with   Chest Pain   Brief hospital course:  SHERELLE FARNEY is a 83 y.o. female with medical history significan for OSA on CPAP, chronic diastolic HF, CAD, PAF s/p ablation 2019 on apixaban , HTN, chronic  pain, and melanoma who presented to the ED for evaluation of shortness of breath and left-sided chest pain. Patient recently had a 2-day hospitalization for sepsis secondary to pneumonia about 3 weeks ago.  Reports she was discharged home with 7 days of Keflex . She followed up with her PCP and a repeat CXR showed improvement in her pneumonia. Two days ago, she started having left-sided chest pain described as crushing that resolved with nitroglycerin . The chest pain returned last night with associated shortness of breath so she presented to the ED for further evaluation.  She also reports some loose stools that were initially coffee-ground but now normal in color.  She endorsed mild cough and leg swelling but denies any fever, chills, headaches, palpitations, vision changes or syncope.   ED Course: Initial vitals show temp 97.7, RR 18, HR 75, BP 181/73, SpO2 94% on room air.  Labs significant for normal white count, stable Hgb at 9.5, normal CMP, troponin 18-17, BNP 730, procalcitonin <0.10, normal COVID, RSV and flu test.  UA with no signs of infection.  EKG shows sinus rhythm with prolonged QTc of 495. CXR shows patchy opacities in the left upper lungs concerning for pneumonia and mild pulmonary interstitial edema with cardiomegaly.  Patient received aspirin  325 mg x 1, IV Lasix  40 mg x 1, IV vancomycin  and cefepime. [facility] was consulted for admission.   Assessment and Plan:  # Hospital-acquired pneumonia - Recent admission for sepsis secondary to pneumonia about 3 weeks ago. - Presented with left-sided  chest pain, shortness of breath and cough - CXR showing patchy opacities in the left upper lung lobe - Patient afebrile with no leukocytosis, stable on room air. - s/p vancomycin , discontinued as MRSA PCR is negative. Continue IV vancomycin  and cefepime - Blood Cx NGTD, f/u sputum culture - RVP negative, MRSA screen negative Follow-up urinary Legionella, urinary strep and - Incentive spirometer, flutter valve - Trend CBC and fever curve   # Chest pain Troponin remained flat, no significant EKG changes Patient is following Dr. Meredeth Stallion, cardiac cath was done in January 2024 Patient was supposed to get cardiac stress test done as an outpatient on Monday, 02/20/2024 Discussed with cardiology, scheduled to get it done tomorrow morning as she is here   # Acute on chronic diastolic heart failure - Presented with chest pain that has not resolved, with associated shortness of breath. - Has clinical, radiological and laboratory evidence of CHF exacerbation - TTE on 01/25/2024 showed EF 60-65%, grade 3 diastolic dysfunction but no valvular pathology. - Only taking Lasix  20 mg BID as needed for edema at home - IV Lasix  40 mg daily - Strict I&O's, daily weight - Trend and replete electrolytes   # Diarrhea: Resolved  - Reports having multiple loose stools after discharge from the hospital on April 25. - Loose stools have improved after she took Imodium few days ago, no bloody stools or melena. 5/13 as per patient no diarrhea since in the evening.  Discontinued contact precautions and no need to test for C. difficile and GI  pathogen as there is no diarrhea.   # Paroxysmal A-fib - S/p ablation in 2019 - EKG on admission shows sinus rhythm - HR stable 55-75 - Continue Eliquis  and Toprol -XL   # HTN - BP elevated with SBP in the 150s to 170s - Continue losartan  and Toprol  XL   # HLD # CAD - Continue aspirin  and rosuvastatin    # OSA with CPAP - Continue CPAP at bedtime   # Neuropathy -  Continue gabapentin    # GERD - Continue Protonix    # Gout - Continue allopurinol   Body mass index is 27.2 kg/m.  Interventions:  Diet: Regular diet DVT Prophylaxis: Therapeutic Anticoagulation with Eliquis    Advance goals of care discussion: Full code  Family Communication: family was present at bedside, at the time of interview.  The pt provided permission to discuss medical plan with the family. Opportunity was given to ask question and all questions were answered satisfactorily.   Disposition:  Pt is from home, admitted with pneumonia and chest pain, still on IV antibiotics, needs stress test, which precludes a safe discharge. Discharge to home, when stable, most likely in 1 to 2 days.  Subjective: No significant events overnight, in the morning time patient was complaining of pain in the left side of chest, with reproducible tenderness.  Which resolved after oxycodone .  Denies of any worsening of shortness of breath, very little cough with phlegm production.  No any other complaints. Denied any palpitations  Physical Exam: General: NAD, lying comfortably Appear in no distress, affect appropriate Eyes: PERRLA ENT: Oral Mucosa Clear, moist  Neck: no JVD,  Cardiovascular: S1 and S2 Present, no Murmur,  Respiratory: good respiratory effort, Bilateral Air entry equal and Decreased, no Crackles, no wheezes Abdomen: Bowel Sound present, Soft and no tenderness,  Skin: no rashes Extremities: no Pedal edema, no calf tenderness Neurologic: without any new focal findings Gait not checked due to patient safety concerns  Vitals:   02/13/24 2105 02/14/24 0401 02/14/24 0753 02/14/24 1622  BP: (!) 158/71 128/65 (!) 129/54 118/68  Pulse: 71 (!) 57 (!) 58 63  Resp:  18 17 17   Temp:  98.1 F (36.7 C) 98.8 F (37.1 C) 99 F (37.2 C)  TempSrc:  Oral    SpO2:  99% 94% 95%  Weight:      Height:        Intake/Output Summary (Last 24 hours) at 02/14/2024 1635 Last data filed at  02/14/2024 1521 Gross per 24 hour  Intake 580.22 ml  Output --  Net 580.22 ml   Filed Weights   02/13/24 0157  Weight: 88.5 kg    Data Reviewed: I have personally reviewed and interpreted daily labs, tele strips, imagings as discussed above. I reviewed all nursing notes, pharmacy notes, vitals, pertinent old records I have discussed plan of care as described above with RN and patient/family.  CBC: Recent Labs  Lab 02/13/24 0203 02/14/24 0501  WBC 5.9 5.8  NEUTROABS 2.3  --   HGB 9.8* 9.6*  HCT 32.4* 30.7*  MCV 93.1 91.1  PLT 293 262   Basic Metabolic Panel: Recent Labs  Lab 02/13/24 0203 02/14/24 0501  NA 141 141  K 4.4 4.1  CL 106 105  CO2 29 28  GLUCOSE 98 88  BUN 12 15  CREATININE 0.92 0.84  CALCIUM  9.2 9.0  MG 2.0  --     Studies: No results found.  Scheduled Meds:  allopurinol   100 mg Oral Daily   apixaban   5 mg Oral BID   aspirin  EC  81 mg Oral Daily   vitamin B-12  500 mcg Oral Daily   diclofenac Sodium  2 g Topical TID   furosemide   40 mg Intravenous Daily   gabapentin   800 mg Oral QID   guaiFENesin   600 mg Oral BID   lidocaine   1 patch Transdermal Q24H   loratadine   10 mg Oral QPM   losartan   100 mg Oral Daily   metoprolol  succinate  25 mg Oral QHS   pantoprazole   40 mg Oral Daily   rosuvastatin   40 mg Oral QHS   sertraline   25 mg Oral TID   Continuous Infusions:  ceFEPime (MAXIPIME) IV 2 g (02/14/24 1310)   PRN Meds: acetaminophen  **OR** acetaminophen , fluticasone , morphine  injection, ondansetron  **OR** ondansetron  (ZOFRAN ) IV, oxyCODONE , polyvinyl alcohol, senna-docusate  Time spent: 55 minutes  Author: Althia Atlas. MD [facility] 02/14/2024 4:35 PM  To reach On-call, see care teams to locate the attending and reach out to them via [email or web address]. If 7PM-7AM, please contact night-coverage If you still have difficulty reaching the attending provider, please page the [facility] (Director on Call) for [facility] on amion for  assistance.

## 2024-02-14 NOTE — Plan of Care (Signed)
  Problem: Education: Goal: Knowledge of General Education information will improve Description: Including pain rating scale, medication(s)/side effects and non-pharmacologic comfort measures Outcome: Progressing   Problem: Health Behavior/Discharge Planning: Goal: Ability to manage health-related needs will improve Outcome: Progressing   Problem: Clinical Measurements: Goal: Ability to maintain clinical measurements within normal limits will improve Outcome: Progressing Goal: Cardiovascular complication will be avoided Outcome: Progressing   Problem: Activity: Goal: Risk for activity intolerance will decrease Outcome: Progressing   Problem: Nutrition: Goal: Adequate nutrition will be maintained Outcome: Progressing   Problem: Coping: Goal: Level of anxiety will decrease Outcome: Progressing   Problem: Elimination: Goal: Will not experience complications related to bowel motility Outcome: Progressing Goal: Will not experience complications related to urinary retention Outcome: Progressing   Problem: Pain Managment: Goal: General experience of comfort will improve and/or be controlled Outcome: Progressing   Problem: Safety: Goal: Ability to remain free from injury will improve Outcome: Progressing   Problem: Skin Integrity: Goal: Risk for impaired skin integrity will decrease Outcome: Progressing

## 2024-02-14 NOTE — Progress Notes (Signed)
 1134 Pt states she has 6/10 chest pain. Dr Hubert Madden made aware, Per MD PRN pain meds placed, no need for EKG at this time.   1144 Oxycodone  5mg  given at this time. Nurse will continue to monitor. Battery replaced on tele box

## 2024-02-14 NOTE — Consult Note (Signed)
 [facility] CLINIC CARDIOLOGY CONSULT NOTE       Patient ID: Sarah Riddle MRN: [id number] DOB/AGE: 01/08/1941 83 y.o.  Admit date: 02/13/2024 Referring Physician Dr. Althia Atlas Primary Physician Aisha Hove, MD Primary Cardiologist Dr. Phyllis Breeze Reason for Consultation chest pain   HPI: Sarah Riddle is a 83 y.o. female  with a past medical history of nonobstructive coronary artery disease, HFpEF, paroxysmal atrial fibrillation s/p ablation 2019 (on Eliquis ), OSA on CPAP, hypertension, hx of melonoma, chronic pain  who presented to the ED on 02/13/2024 for left sided chest pressure, SOB that resolved with nitroglycerin  at home. Patient recently hospitalized for pneumonia and bacteremia and discharged on 04/25. Cardiology was consulted for further evaluation of chest pain.   Patient presented to the ED with multiple episodes of chest pain at home that resolved with nitroglycerin . Patient states she would have chest pain for about 3 minutes that resolved within seconds of taking nitroglycerin . Patient reports the chest pain came on at rest and with ambulation. Patient states her pain started in her abdomen then would radiate to her chest. Patient denies SOB or palpitations. Work up in the ED notable for Na 141, K 4.4, Mg 2.0, Cr 0.92, Hgb 9.8, plts 293. CXR with patchy opacities in LUL and additional patchy bibasilar opacities, cardiomegaly with mild interstitial congestion, bilateral pleural effusions.  BNP elevated at 730. Troponins negative x 2 (18, 17). EKG in ED with sinus rhythm, rate 71 bpm, without acute ischemic changes. Patient received 1x IV lasix  40 mg. Given lidocaine  patch and tylenol  for pain. Started on IV Abx.   At the time of my evaluation this afternoon, patient was resting in hospital bed in no acute distress. Discussed symptoms in further detail. Patient reports having multiple episodes of chest and abdominal pain that resolved with nitroglycerin . Patient states she had  another episode of chest pain that resolved with lidocaine  patch and 1x dose of oxycodone . Patient states her chest pain is exacerbated with palpation as well. Patient denies chest pain at time of my evaluation. Patient also denies SOB and endorses mild LE swelling. Patient reports good UOP s/p IV lasix .   Pertinent Cardiac History LHC 11/01/2022 (Dr. Meredeth Stallion)   1st Mrg lesion is 30% stenosed.   LV end diastolic pressure is normal.   The left ventricular ejection fraction is 50-55% by visual estimate. Mild disease in first diagonal about 35 to 40% rest of the coronaries are normal.  Patient does not have any significant coronary artery disease.  Elevated troponin probably due to demand ischemia.    Review of systems complete and found to be negative unless listed above    Past Medical History:  Diagnosis Date   A-fib [facility])    Accelerated hypertension 10/31/2022   Age related osteoporosis    Cancer (HCC)    Melanoma x 2 -- Lt Leg -1976 Rt Arm - 2014   Chronic bilateral low back pain    Chronic kidney disease    Complication of anesthesia    DVT (deep venous thrombosis) (HCC) 1960   right leg  was on BC pills   Dysrhythmia    Family history of breast cancer    Family history of colon cancer    Family history of melanoma    Family history of prostate cancer    Fibrocystic breast disease    GERD (gastroesophageal reflux disease)    Headache    migraine   Heart murmur    not heard now.  History of kidney stones    Hyperlipidemia    Hypertension    IBS (irritable bowel syndrome)    Lumbago    Mitral valve prolapse    Neuropathy    Obstructive sleep apnea    Osteoarthritis of shoulder    PONV (postoperative nausea and vomiting)     Past Surgical History:  Procedure Laterality Date   ABDOMINAL HYSTERECTOMY     BACK SURGERY     BREAST BIOPSY Left 04/20/2023   us  bx/ heart clip/ path pending   BREAST BIOPSY Left 04/20/2023   US  LT BREAST BX W LOC DEV 1ST LESION IMG BX SPEC US   GUIDE 04/20/2023 [facility]   BREAST CYST ASPIRATION Left 2001   Negative   CARDIAC ELECTROPHYSIOLOGY MAPPING AND ABLATION     EYE SURGERY     HUMERUS IM NAIL Right 05/05/2022   Procedure: INTRAMEDULLARY (IM) NAIL HUMERAL;  Surgeon: Laneta Pintos, MD;  Location: [facility] OR;  Service: Orthopedics;  Laterality: Right;   HYSTEROTOMY     L4-L5  2020   titaum rebok cage   LEFT HEART CATH AND CORONARY ANGIOGRAPHY N/A 11/01/2022   Procedure: LEFT HEART CATH AND CORONARY ANGIOGRAPHY and possible PCI and stent;  Surgeon: Cherrie Cornwall, MD;  Location: [facility] INVASIVE CV LAB;  Service: Cardiovascular;  Laterality: N/A;   TOTAL HIP ARTHROPLASTY Left     Medications Prior to Admission  Medication Sig Dispense Refill Last Dose/Taking   acetaminophen  (TYLENOL ) 500 MG tablet Take 500 mg by mouth every 6 (six) hours as needed for moderate pain (pain score 4-6) or mild pain (pain score 1-3). Once a day   Taking As Needed   alendronate  (FOSAMAX ) 70 MG tablet Take 1 tablet (70 mg total) by mouth every Thursday. Take with a full glass of water on an empty stomach. (Patient taking differently: Take 70 mg by mouth every Tuesday. Take with a full glass of water on an empty stomach.) 12 tablet 2 Past Week   allopurinol  (ZYLOPRIM ) 100 MG tablet Take 1 tablet (100 mg total) by mouth daily. 90 tablet 1 02/12/2024   apixaban  (ELIQUIS ) 5 MG TABS tablet Take 1 tablet (5 mg total) by mouth 2 (two) times daily. 60 tablet 0 02/12/2024   aspirin  EC 81 MG tablet Take 1 tablet (81 mg total) by mouth daily. Swallow whole. 30 tablet 0 02/12/2024   azelastine  (ASTELIN ) 0.1 % nasal spray Place 2 sprays into both nostrils at bedtime. Use in each nostril as directed (Patient taking differently: Place 2 sprays into both nostrils daily as needed for allergies or rhinitis. Use in each nostril as directed) 30 mL 12 Taking Differently   cyanocobalamin  (VITAMIN B12) 1000 MCG tablet Take 1,000 mcg by mouth daily. Monday to Friday   Past Week    EPINEPHrine  0.3 mg/0.3 mL IJ SOAJ injection Inject 0.3 mg into the muscle as needed for anaphylaxis.   Taking As Needed   fluticasone  (FLONASE ) 50 MCG/ACT nasal spray Place 2 sprays into both nostrils daily as needed for allergies or rhinitis.   Taking As Needed   furosemide  (LASIX ) 20 MG tablet Take 1 tablet (20 mg total) by mouth 2 (two) times daily as needed for fluid or edema. 60 tablet 3 02/12/2024   gabapentin  (NEURONTIN ) 800 MG tablet TAKE 1 TABLET FOUR TIMES DAILY 360 tablet 3 02/12/2024   hydrALAZINE  (APRESOLINE ) 25 MG tablet Take 25 mg by mouth 3 (three) times daily.   02/12/2024   isosorbide  mononitrate (IMDUR ) 60 MG 24  hr tablet Take 60 mg by mouth daily.   02/12/2024   levocetirizine (XYZAL ) 5 MG tablet TAKE 1 TABLET EVERY EVENING 90 tablet 3 02/12/2024   losartan  (COZAAR ) 100 MG tablet Take 1 tablet (100 mg total) by mouth daily. 60 tablet 0 02/12/2024   meclizine  (ANTIVERT ) 12.5 MG tablet Take 1 tablet (12.5 mg total) by mouth 3 (three) times daily as needed for dizziness. 30 tablet 0 Taking As Needed   metoprolol  succinate (TOPROL -XL) 25 MG 24 hr tablet TAKE 1 TABLET EVERY DAY (Patient taking differently: Take 25 mg by mouth at bedtime.) 90 tablet 3 02/12/2024   nitroGLYCERIN  (NITROSTAT ) 0.4 MG SL tablet Place 1 tablet (0.4 mg total) under the tongue every 5 (five) minutes as needed for chest pain. 25 tablet 0 Taking As Needed   pantoprazole  (PROTONIX ) 40 MG tablet TAKE 1 TABLET EVERY DAY 90 tablet 3 02/12/2024   Polyethyl Glyc-Propyl Glyc PF (SYSTANE HYDRATION PF) 0.4-0.3 % SOLN Apply 1 drop to eye daily as needed (dry eyes).   Taking As Needed   rosuvastatin  (CRESTOR ) 40 MG tablet TAKE 1 TABLET EVERY DAY (Patient taking differently: Take 40 mg by mouth at bedtime.) 90 tablet 3 02/12/2024   sertraline  (ZOLOFT ) 25 MG tablet TAKE 1 TABLET THREE TIMES DAILY 180 tablet 5 02/12/2024   Vitamin D , Ergocalciferol , 50000 units CAPS Take 1 capsule by mouth once a week. 12 capsule 3 Past Week   OXYGEN   Inhale 2 L/min into the lungs at bedtime. And as needed for SOB      Social History   Socioeconomic History   Marital status: Married    Spouse name: Not on file   Number of children: Not on file   Years of education: Not on file   Highest education level: Not on file  Occupational History   Not on file  Tobacco Use   Smoking status: Never   Smokeless tobacco: Never  Vaping Use   Vaping status: Never Used  Substance and Sexual Activity   Alcohol use: No   Drug use: No   Sexual activity: Not Currently  Other Topics Concern   Not on file  Social History Narrative   Not on file   Social Drivers of Health   Financial Resource Strain: Low Risk  (09/23/2023)   Overall Financial Resource Strain (CARDIA)    Difficulty of Paying Living Expenses: Not hard at all  Food Insecurity: No Food Insecurity (02/13/2024)   Hunger Vital Sign    Worried About Running Out of Food in the Last Year: Never true    Ran Out of Food in the Last Year: Never true  Transportation Needs: No Transportation Needs (02/13/2024)   PRAPARE - Administrator, Civil Service (Medical): No    Lack of Transportation (Non-Medical): No  Physical Activity: Inactive (09/23/2023)   Exercise Vital Sign    Days of Exercise per Week: 0 days    Minutes of Exercise per Session: 0 min  Stress: No Stress Concern Present (09/23/2023)   [employer] of Occupational Health - Occupational Stress Questionnaire    Feeling of Stress : Only a little  Social Connections: Moderately Isolated (02/13/2024)   Social Connection and Isolation Panel [NHANES]    Frequency of Communication with Friends and Family: More than three times a week    Frequency of Social Gatherings with Friends and Family: Twice a week    Attends Religious Services: Never    Database administrator or Organizations: No  Attends Banker Meetings: Never    Marital Status: Married  Catering manager Violence: Not At Risk (02/13/2024)    Humiliation, Afraid, Rape, and Kick questionnaire    Fear of Current or Ex-Partner: No    Emotionally Abused: No    Physically Abused: No    Sexually Abused: No    Family History  Problem Relation Age of Onset   Colon cancer Mother 29   Prostate cancer Father        dx 16s   Cancer Sister    Acute myelogenous leukemia Sister        dx 50s   Prostate cancer Brother    Melanoma Brother    Prostate cancer Brother    Melanoma Brother    HIV/AIDS Brother 74   Tuberculosis Paternal Aunt    Melanoma Other    Breast cancer Other    Melanoma Niece      Vitals:   02/13/24 2037 02/13/24 2105 02/14/24 0401 02/14/24 0753  BP: (!) 158/71 (!) 158/71 128/65 (!) 129/54  Pulse: 71 71 (!) 57 (!) 58  Resp: 19  18 17   Temp: 98.4 F (36.9 C)  98.1 F (36.7 C) 98.8 F (37.1 C)  TempSrc: Oral  Oral   SpO2: 93%  99% 94%  Weight:      Height:        PHYSICAL EXAM General: well appearing elderly female, well nourished, in no acute distress. HEENT: Normocephalic and atraumatic. Neck: No JVD.   Lungs: Normal respiratory effort on room air. Clear bilaterally to auscultation. No wheezes, crackles, rhonchi.  Heart: HRRR. Normal S1 and S2 without gallops or murmurs.  Abdomen: Non-distended appearing.  Msk: Normal strength and tone for age. Extremities: Warm and well perfused. No clubbing, cyanosis. No edema.  Neuro: Alert and oriented X 3. Psych: Answers questions appropriately.   Labs: Basic Metabolic Panel: Recent Labs    02/13/24 0203 02/14/24 0501  NA 141 141  K 4.4 4.1  CL 106 105  CO2 29 28  GLUCOSE 98 88  BUN 12 15  CREATININE 0.92 0.84  CALCIUM  9.2 9.0  MG 2.0  --    Liver Function Tests: Recent Labs    02/13/24 0203  AST 37  ALT 21  ALKPHOS 63  BILITOT 0.7  PROT 6.0*  ALBUMIN 3.1*   Recent Labs    02/13/24 0203  LIPASE 28   CBC: Recent Labs    02/13/24 0203 02/14/24 0501  WBC 5.9 5.8  NEUTROABS 2.3  --   HGB 9.8* 9.6*  HCT 32.4* 30.7*  MCV 93.1  91.1  PLT 293 262   Cardiac Enzymes: Recent Labs    02/13/24 0203 02/13/24 0357  TROPONINIHS 18* 17   BNP: Recent Labs    02/13/24 0203  BNP 730.3*   D-Dimer: No results for input(s): "DDIMER" in the last 72 hours. Hemoglobin A1C: No results for input(s): "HGBA1C" in the last 72 hours. Fasting Lipid Panel: No results for input(s): "CHOL", "HDL", "LDLCALC", "TRIG", "CHOLHDL", "LDLDIRECT" in the last 72 hours. Thyroid  Function Tests: No results for input(s): "TSH", "T4TOTAL", "T3FREE", "THYROIDAB" in the last 72 hours.  Invalid input(s): "FREET3" Anemia Panel: No results for input(s): "VITAMINB12", "FOLATE", "FERRITIN", "TIBC", "IRON", "RETICCTPCT" in the last 72 hours.   Radiology: DG Chest Portable 1 View Result Date: 02/13/2024 CLINICAL DATA:  Initial evaluation for acute chest pain. EXAM: PORTABLE CHEST 1 VIEW COMPARISON:  Prior radiograph from 02/09/2024. FINDINGS: Cardiomegaly. Mediastinal silhouette within normal limits. Aortic  atherosclerosis. Lungs mildly hypoinflated. Small layering bilateral pleural effusions. Perihilar vascular congestion with mild pulmonary interstitial congestion. Patchy opacities involving the left upper lobe, slightly more prominent from prior. Finding is suspicious for infection/pneumonia. Few additional patchy opacities involving the left greater than right lung base could reflect additional infiltrates or atelectasis. No pneumothorax. Advanced osteoarthritic changes noted about the left shoulder. Sequelae of prior ORIF partially visualized at the right humerus. IMPRESSION: 1. Patchy opacities involving the left upper lobe, suspicious for infection/pneumonia. Additional patchy bibasilar opacities could reflect additional infiltrates or atelectasis. 2. Cardiomegaly with mild pulmonary interstitial congestion and small layering bilateral pleural effusions. 3.  Aortic Atherosclerosis (ICD10-I70.0). Electronically Signed   By: Virgia Griffins M.D.   On:  02/13/2024 03:38   DG Chest 2 View Result Date: 02/10/2024 CLINICAL DATA:  83 year old female with pneumonia EXAM: CHEST - 2 VIEW COMPARISON:  01/25/2024 FINDINGS: Cardiomediastinal silhouette unchanged in size and contour. No evidence of central vascular congestion. No interlobular septal thickening. Improving multifocal interstitial and airspace opacities compared to the prior plain film. There is persisting peripheral reticulonodular opacities at the upper lungs left greater than right and the right lower lung. No new airspace disease. No pneumothorax or pleural effusion. Background coarsened interstitial markings. No acute displaced fracture. Degenerative changes of the spine. IMPRESSION: Resolving multifocal pneumonia Electronically Signed   By: Myrlene Asper D.O.   On: 02/10/2024 13:05   ECHOCARDIOGRAM COMPLETE Result Date: 01/26/2024    ECHOCARDIOGRAM REPORT   Patient Name:   SHANEIL TAPIA Date of Exam: 01/25/2024 Medical Rec #:  [id number]        Height:       71.0 in Accession #:    [id number]       Weight:       207.9 lb Date of Birth:  1941/02/10       BSA:          2.144 m Patient Age:    82 years         BP:           120/56 mmHg Patient Gender: F                HR:           81 bpm. Exam Location:  [facility] Procedure: 2D Echo, Cardiac Doppler and Color Doppler (Both Spectral and Color            Flow Doppler were utilized during procedure). Indications:     I21.4 NSTEMI  History:         Patient has prior history of Echocardiogram examinations, most                  recent 02/04/2023. Mitral Valve Prolapse, Arrythmias:Atrial                  Fibrillation, Signs/Symptoms:Murmur; Risk Factors:Dyslipidemia                  and Hypertension. Obstructive sleep apnea.  Sonographer:     Brigid Canada RDCS Referring Phys:  [id number] CARALYN HUDSON Diagnosing Phys: Dwayne D Callwood MD IMPRESSIONS  1. Left ventricular ejection fraction, by estimation, is 60 to 65%. The left ventricle has moderate to  severely decreased function. The left ventricle has no regional wall motion abnormalities. Left ventricular diastolic parameters are consistent with Grade III diastolic dysfunction (restrictive).  2. Right ventricular systolic function is normal. The right ventricular size is normal.  3. The mitral valve is normal in  structure. Mild mitral valve regurgitation.  4. The aortic valve is normal in structure. Aortic valve regurgitation is not visualized. FINDINGS  Left Ventricle: Left ventricular ejection fraction, by estimation, is 60 to 65%. The left ventricle has moderate to severely decreased function. The left ventricle has no regional wall motion abnormalities. Strain was performed and the global longitudinal strain is indeterminate. The left ventricular internal cavity size was normal in size. There is no left ventricular hypertrophy. Left ventricular diastolic parameters are consistent with Grade III diastolic dysfunction (restrictive). Right Ventricle: The right ventricular size is normal. No increase in right ventricular wall thickness. Right ventricular systolic function is normal. Left Atrium: Left atrial size was normal in size. Right Atrium: Right atrial size was normal in size. Pericardium: There is no evidence of pericardial effusion. Mitral Valve: The mitral valve is normal in structure. Mild mitral valve regurgitation. Tricuspid Valve: The tricuspid valve is normal in structure. Tricuspid valve regurgitation is trivial. Aortic Valve: The aortic valve is normal in structure. Aortic valve regurgitation is not visualized. Pulmonic Valve: The pulmonic valve was normal in structure. Pulmonic valve regurgitation is not visualized. Aorta: The ascending aorta was not well visualized. IAS/Shunts: No atrial level shunt detected by color flow Doppler. Additional Comments: 3D was performed not requiring image post processing on an independent workstation and was indeterminate.  LEFT VENTRICLE PLAX 2D LVIDd:          5.50 cm   Diastology LVIDs:         3.50 cm   LV e' medial:    8.09 cm/s LV PW:         0.90 cm   LV E/e' medial:  17.1 LV IVS:        0.70 cm   LV e' lateral:   9.97 cm/s LVOT diam:     2.30 cm   LV E/e' lateral: 13.9 LV SV:         92 LV SV Index:   43 LVOT Area:     4.15 cm  RIGHT VENTRICLE             IVC RV Basal diam:  5.10 cm     IVC diam: 2.40 cm RV S prime:     17.03 cm/s TAPSE (M-mode): 1.9 cm LEFT ATRIUM           Index        RIGHT ATRIUM           Index LA diam:      5.00 cm 2.33 cm/m   RA Area:     23.60 cm LA Vol (A2C): 61.2 ml 28.55 ml/m  RA Volume:   86.60 ml  40.40 ml/m LA Vol (A4C): 52.3 ml 24.40 ml/m  AORTIC VALVE LVOT Vmax:   114.67 cm/s LVOT Vmean:  78.333 cm/s LVOT VTI:    0.221 m  AORTA Ao Root diam: 3.40 cm Ao Asc diam:  3.70 cm MITRAL VALVE                TRICUSPID VALVE MV Area (PHT): 5.46 cm     TR Peak grad:   35.3 mmHg MV Decel Time: 139 msec     TR Vmax:        297.00 cm/s MV E velocity: 138.50 cm/s MV A velocity: 55.10 cm/s   SHUNTS MV E/A ratio:  2.51         Systemic VTI:  0.22 m  Systemic Diam: 2.30 cm Antonette Batters MD Electronically signed by Antonette Batters MD Signature Date/Time: 01/26/2024/2:12:35 PM    Final    DG Chest Port 1 View Result Date: 01/25/2024 CLINICAL DATA:  Shortness of breath and cough. EXAM: PORTABLE CHEST 1 VIEW COMPARISON:  January 09, 2023 FINDINGS: The heart size and mediastinal contours are within normal limits. Mild prominence of the central pulmonary vasculature is noted. There is marked severity calcification of the aortic arch. Mild diffusely increased interstitial lung markings are seen. Moderate severity superimposed left upper lobe infiltrate is noted. Mild superimposed right upper lobe and bibasilar infiltrates are also suspected. No pleural effusion or pneumothorax is identified. Postoperative changes are seen involving the right humerus. Multilevel degenerative changes seen throughout the thoracic spine.  IMPRESSION: 1. Mild pulmonary vascular congestion with mild interstitial edema. 2. Suspected superimposed bilateral upper lobe and bibasilar infiltrates, left greater than right. Further evaluation with chest CT is recommended. Electronically Signed   By: Virgle Grime M.D.   On: 01/25/2024 01:38    ECHO 01/25/2024  1. Left ventricular ejection fraction, by estimation, is 60 to 65%. The left ventricle has moderate to severely decreased function. The left ventricle has no regional wall motion abnormalities. Left ventricular diastolic parameters are consistent with Grade III diastolic dysfunction (restrictive).   2. Right ventricular systolic function is normal. The right ventricular size is normal.   3. The mitral valve is normal in structure. Mild mitral valve regurgitation.   4. The aortic valve is normal in structure. Aortic valve regurgitation is not visualized.   TELEMETRY reviewed by me 02/14/2024: sinus rhythm, rate 60s  EKG reviewed by me: sinus rhythm, rate 71 bpm (without acute ischemic changes)  Data reviewed by me 02/14/2024: last 24h vitals tele labs imaging I/O ED provider note, admission H&P.  Principal Problem:   HCAP (healthcare-associated pneumonia) Active Problems:   Nonspecific chest pain   Pleural effusion, bilateral   Acute on chronic diastolic CHF (congestive heart failure) (HCC)    ASSESSMENT AND PLAN:  Sarah Riddle is a 83 y.o. female  with a past medical history of nonobstructive coronary artery disease, HFpEF, paroxysmal atrial fibrillation s/p ablation 2019 (on Eliquis ), OSA on CPAP, hypertension, hx of melonoma, chronic pain  who presented to the ED on 02/13/2024 for left sided chest pressure, SOB that resolved with nitroglycerin  at home. Patient recently hospitalized for pneumonia and bacteremia and discharged on 04/25. Cardiology was consulted for further evaluation of chest pain.   # Chest pain # Paroxysmal atrial fibrillation s/p ablation (2019) #  Hypertension  # Acute on chronic HFpEF Patient presents to ED after having 3-4 episodes of abdominal/chest pain that quickly resolved with nitroglycerin  at home. Denies SOB. Patient states chest pain exacerbated by palpation. LHC in 10/2022 with mild disease in first diagonal about 35 to 40% and rest of the coronaries are normal per Dr. Phyllis Breeze.  Patient does not have any significant coronary artery disease.Echo from prior admission with EF 60-65%, no RWMA. BNP elevated at 730. Troponins negative x 2 (18, 17). EKG in ED with sinus rhythm, rate 71 bpm, without acute ischemic changes. S/p IV lasix  40 mg x1 pt reports good UOP. BP and HR stable this afternoon. -Cardiac stress test ordered and to be done tomorrow. NPO starting at midnight. -Continue Eliquis  5 mg BID for stroke risk reduction. -Continue ASA 81 mg, rosuvastatin  40 mg daily. -Continue losartan  100 mg, metoprolol  succinate 25 mg daily -Continue IV lasix  40 mg daily.  Closely monitor renal function and UOP.  This patient's plan of care was discussed and created with Dr. Custovic and she is in agreement.  Signed: Creighton Doffing, PA-C  02/14/2024, 2:28 PM [facility]

## 2024-02-14 NOTE — Progress Notes (Signed)
 Pharmacy Antibiotic Note  Sarah Riddle is a 83 y.o. female admitted on 02/13/2024 with pneumonia.  Pharmacy has been consulted for cefepime dosing.  -Hx: recently had a 2-day hospitalization for sepsis secondary to pneumonia about 3 weeks ago. discharged home with 7 days of Keflex   --WBC 5.8   afeb     5/12: PCT <0.10 --MRSA PCR negative- vancomycin  discontinued  Plan: Scr improved 0.92 >> 0.84 -adjust to Cefepime 2 gm IV q8h for Crcl 62 ml/min   Continue to assess renal fxn, cultures, length of therapy, etc    Height: 5\' 11"  (180.3 cm) Weight: 88.5 kg (195 lb) IBW/kg (Calculated) : 70.8  Temp (24hrs), Avg:98.4 F (36.9 C), Min:98.1 F (36.7 C), Max:98.8 F (37.1 C)  Recent Labs  Lab 02/13/24 0203 02/14/24 0501  WBC 5.9 5.8  CREATININE 0.92 0.84    Estimated Creatinine Clearance: 62.4 mL/min (by C-G formula based on SCr of 0.84 mg/dL).    Allergies  Allergen Reactions   Iodinated Contrast Media Shortness Of Breath and Other (See Comments)    Can be premedicated  Have been able to tolerate if given premeds (benadryl , prednisone )   Ibuprofen Other (See Comments)    "stomach problems"    Sulfa Antibiotics Other (See Comments)    "Stomach problems"  "Stomach problems"  "Stomach problems"  "Stomach problems"   Doxycycline Diarrhea and Nausea And Vomiting   Tetracyclines & Related Nausea Only and Nausea And Vomiting    Antimicrobials this admission: Cefepime 5/12 >>   Vancomycin   5/12 >>  5/13  Dose adjustments this admission:    Microbiology results: 5/12 BCx: [id number]   UCx:      Sputum:   pending 5/12 MRSA PCR: negative -CXR 5/12:  Patchy opacities involving the left upper lobe, suspicious for infection/pneumonia. Additional patchy bibasilar opacities could reflect additional infiltrates or atelectasis.  Thank you for allowing pharmacy to be a part of this patient's care.  Thomasine Flick PharmD Clinical Pharmacist 02/14/2024

## 2024-02-15 ENCOUNTER — Other Ambulatory Visit: Payer: Self-pay

## 2024-02-15 ENCOUNTER — Inpatient Hospital Stay

## 2024-02-15 DIAGNOSIS — J189 Pneumonia, unspecified organism: Secondary | ICD-10-CM | POA: Diagnosis not present

## 2024-02-15 LAB — BASIC METABOLIC PANEL WITH GFR
Anion gap: 6 (ref 5–15)
BUN: 23 mg/dL (ref 8–23)
CO2: 28 mmol/L (ref 22–32)
Calcium: 9.1 mg/dL (ref 8.9–10.3)
Chloride: 105 mmol/L (ref 98–111)
Creatinine, Ser: 0.95 mg/dL (ref 0.44–1.00)
GFR, Estimated: 59 mL/min — ABNORMAL LOW (ref >60)
Glucose, Bld: 107 mg/dL — ABNORMAL HIGH (ref 70–99)
Potassium: 3.8 mmol/L (ref 3.5–5.1)
Sodium: 139 mmol/L (ref 135–145)

## 2024-02-15 LAB — NM MYOCAR MULTI W/SPECT W/WALL MOTION / EF
Estimated workload: 1
Exercise duration (min): 1 min
Exercise duration (sec): 0 s
LV dias vol: 89 mL (ref 46–106)
LV sys vol: 35 mL
MPHR: 137 {beats}/min
Nuc Stress EF: 61 %
Peak HR: 88 {beats}/min
Percent HR: 64 %
Rest HR: 68 {beats}/min
Rest Nuclear Isotope Dose: 11.1 mCi
SDS: 0
SRS: 1
SSS: 0
ST Depression (mm): 0 mm
Stress Nuclear Isotope Dose: 33 mCi
TID: 0.91

## 2024-02-15 LAB — CBC
HCT: 30.6 % — ABNORMAL LOW (ref 36.0–46.0)
Hemoglobin: 9.5 g/dL — ABNORMAL LOW (ref 12.0–15.0)
MCH: 27.9 pg (ref 26.0–34.0)
MCHC: 31 g/dL (ref 30.0–36.0)
MCV: 90 fL (ref 80.0–100.0)
Platelets: 245 10*3/uL (ref 150–400)
RBC: 3.4 MIL/uL — ABNORMAL LOW (ref 3.87–5.11)
RDW: 13.4 % (ref 11.5–15.5)
WBC: 5.9 10*3/uL (ref 4.0–10.5)
nRBC: 0 % (ref 0.0–0.2)

## 2024-02-15 LAB — PHOSPHORUS: Phosphorus: 5.1 mg/dL — ABNORMAL HIGH (ref 2.5–4.6)

## 2024-02-15 LAB — MAGNESIUM: Magnesium: 2 mg/dL (ref 1.7–2.4)

## 2024-02-15 MED ORDER — SACCHAROMYCES BOULARDII 250 MG PO CAPS
250.0000 mg | ORAL_CAPSULE | Freq: Two times a day (BID) | ORAL | 0 refills | Status: AC
  Filled 2024-02-15: qty 50, 25d supply, fill #0

## 2024-02-15 MED ORDER — TECHNETIUM TC 99M TETROFOSMIN IV KIT
11.0900 | PACK | Freq: Once | INTRAVENOUS | Status: AC | PRN
  Administered 2024-02-15: 11.09 via INTRAVENOUS

## 2024-02-15 MED ORDER — FUROSEMIDE 20 MG PO TABS: 20.0000 mg | ORAL_TABLET | Freq: Every day | ORAL | Status: DC

## 2024-02-15 MED ORDER — METOPROLOL SUCCINATE ER 25 MG PO TB24: 25.0000 mg | ORAL_TABLET | Freq: Every day | ORAL | 3 refills | Status: DC

## 2024-02-15 MED ORDER — AMOXICILLIN-POT CLAVULANATE 875-125 MG PO TABS: 1.0000 | ORAL_TABLET | Freq: Two times a day (BID) | ORAL | 0 refills | Status: DC

## 2024-02-15 MED ORDER — TECHNETIUM TC 99M TETROFOSMIN IV KIT
32.9800 | PACK | Freq: Once | INTRAVENOUS | Status: AC | PRN
  Administered 2024-02-15: 32.98 via INTRAVENOUS

## 2024-02-15 MED ORDER — SODIUM CHLORIDE 0.9 % IV SOLN
2.0000 g | Freq: Two times a day (BID) | INTRAVENOUS | Status: DC
  Filled 2024-02-15: qty 12.5

## 2024-02-15 MED ORDER — METOPROLOL SUCCINATE ER 25 MG PO TB24: 25.0000 mg | ORAL_TABLET | Freq: Every day | ORAL | Status: DC

## 2024-02-15 MED ORDER — AMOXICILLIN-POT CLAVULANATE 875-125 MG PO TABS
1.0000 | ORAL_TABLET | Freq: Two times a day (BID) | ORAL | 0 refills | Status: AC
  Filled 2024-02-15: qty 10, 5d supply, fill #0

## 2024-02-15 MED ORDER — DICLOFENAC SODIUM 1 % EX GEL: 2.0000 g | Freq: Three times a day (TID) | CUTANEOUS | Status: DC

## 2024-02-15 MED ORDER — SACCHAROMYCES BOULARDII 250 MG PO CAPS: 250.0000 mg | ORAL_CAPSULE | Freq: Two times a day (BID) | ORAL | 0 refills | Status: DC

## 2024-02-15 MED ORDER — REGADENOSON 0.4 MG/5ML IV SOLN
0.4000 mg | Freq: Once | INTRAVENOUS | Status: AC
  Administered 2024-02-15: 0.4 mg via INTRAVENOUS

## 2024-02-15 NOTE — Plan of Care (Signed)
   Problem: Education: Goal: Knowledge of General Education information will improve Description: Including pain rating scale, medication(s)/side effects and non-pharmacologic comfort measures Outcome: Progressing   Problem: Health Behavior/Discharge Planning: Goal: Ability to manage health-related needs will improve Outcome: Progressing   Problem: Clinical Measurements: Goal: Will remain free from infection Outcome: Progressing

## 2024-02-15 NOTE — Progress Notes (Signed)
 Pharmacy Antibiotic Note  Sarah Riddle is a 83 y.o. female admitted on 02/13/2024 with pneumonia.  Pharmacy has been consulted for cefepime dosing.  -Hx: recently had a 2-day hospitalization for sepsis secondary to pneumonia about 3 weeks ago. discharged home with 7 days of Keflex   --WBC 5.9   afeb     5/12: PCT <0.10 --MRSA PCR negative- vancomycin  discontinued  Plan: Scr 0.92 >> 0.84 >>0.95 -adjust Cefepime to 2 gm IV q12h for Crcl 55 ml/min   Continue to assess renal fxn, cultures, length of therapy, etc    Height: 5\' 11"  (180.3 cm) Weight: 88.5 kg (195 lb) IBW/kg (Calculated) : 70.8  Temp (24hrs), Avg:98.7 F (37.1 C), Min:98.5 F (36.9 C), Max:99 F (37.2 C)  Recent Labs  Lab 02/13/24 0203 02/14/24 0501 02/15/24 0329  WBC 5.9 5.8 5.9  CREATININE 0.92 0.84 0.95    Estimated Creatinine Clearance: 55.2 mL/min (by C-G formula based on SCr of 0.95 mg/dL).    Allergies  Allergen Reactions   Iodinated Contrast Media Shortness Of Breath and Other (See Comments)    Can be premedicated  Have been able to tolerate if given premeds (benadryl , prednisone )   Ibuprofen Other (See Comments)    "stomach problems"    Sulfa Antibiotics Other (See Comments)    "Stomach problems"  "Stomach problems"  "Stomach problems"  "Stomach problems"   Doxycycline Diarrhea and Nausea And Vomiting   Tetracyclines & Related Nausea Only and Nausea And Vomiting    Antimicrobials this admission: Cefepime 5/12 >>   Vancomycin   5/12 >>  5/13  Dose adjustments this admission:  5/14 Cefepiem q8h to q12h  Microbiology results: 5/12 BCx: NGx1d   UCx:      Sputum:   pending 5/12 MRSA PCR: negative -CXR 5/12:  Patchy opacities involving the left upper lobe, suspicious for infection/pneumonia. Additional patchy bibasilar opacities could reflect additional infiltrates or atelectasis.  Thank you for allowing pharmacy to be a part of this patient's care.  Thomasine Flick PharmD Clinical  Pharmacist 02/15/2024

## 2024-02-15 NOTE — Plan of Care (Signed)
  Problem: Education: Goal: Knowledge of General Education information will improve Description: Including pain rating scale, medication(s)/side effects and non-pharmacologic comfort measures 02/15/2024 1529 by Robinson Chough, LPN Outcome: Adequate for Discharge 02/15/2024 1047 by Robinson Chough, LPN Outcome: Progressing   Problem: Health Behavior/Discharge Planning: Goal: Ability to manage health-related needs will improve 02/15/2024 1529 by Robinson Chough, LPN Outcome: Adequate for Discharge 02/15/2024 1047 by Robinson Chough, LPN Outcome: Progressing   Problem: Clinical Measurements: Goal: Ability to maintain clinical measurements within normal limits will improve Outcome: Adequate for Discharge Goal: Will remain free from infection 02/15/2024 1529 by Robinson Chough, LPN Outcome: Adequate for Discharge 02/15/2024 1047 by Robinson Chough, LPN Outcome: Progressing Goal: Diagnostic test results will improve Outcome: Adequate for Discharge Goal: Respiratory complications will improve Outcome: Adequate for Discharge Goal: Cardiovascular complication will be avoided Outcome: Adequate for Discharge   Problem: Activity: Goal: Risk for activity intolerance will decrease Outcome: Adequate for Discharge   Problem: Nutrition: Goal: Adequate nutrition will be maintained Outcome: Adequate for Discharge   Problem: Coping: Goal: Level of anxiety will decrease Outcome: Adequate for Discharge   Problem: Elimination: Goal: Will not experience complications related to bowel motility Outcome: Adequate for Discharge Goal: Will not experience complications related to urinary retention Outcome: Adequate for Discharge   Problem: Pain Managment: Goal: General experience of comfort will improve and/or be controlled Outcome: Adequate for Discharge   Problem: Safety: Goal: Ability to remain free from injury will improve Outcome: Adequate for Discharge   Problem: Skin Integrity: Goal: Risk for  impaired skin integrity will decrease Outcome: Adequate for Discharge

## 2024-02-15 NOTE — Progress Notes (Signed)
 [facility] Liaison Note  02/15/2024  Sarah Riddle Jul 24, 1941 [id number]  Location: RN Hospital Liaison met patient at bedside at [facility].  Insurance: [facility]   Sarah Riddle is a 83 y.o. female who is a Primary Care Patient of Aisha Hove, MD  [facility]. The patient was screened for 7 day readmission hospitalization with noted extreme risk score for unplanned readmission risk with 3 IP in 6 months.  Patient is currently active with Care Management for chronic disease management services.  Patient has been engaged by a  Tourist information centre manager.  Our community based plan of care has focused on disease management and community resource support.   Patient will receive a post hospital call and will be evaluated for assessments and disease process education.   Chart was screened and reviewed on patient's electronic medical record revealing patient was admitted for HCAP. Liaison attempted bedside visit and pt was in nuclear medicine (off the floor).  Liaison will collaborate with the involved team with [facility] concerning pt's current disposition.  No TOC team member listed to notify on [facility] involvement.    [facility] Care Management/Population Health does not replace or interfere with any arrangements made by the Inpatient Transition of Care team.   For questions contact:   Lilla Reichert, RN, BSN Hospital Liaison [place]   [facility] [place], Population Health Office Hours MTWF  8:00 am-6:00 pm Direct Dial: [phone] mobile [email or web address]

## 2024-02-15 NOTE — Progress Notes (Addendum)
 [facility] CARDIOLOGY PROGRESS NOTE       Patient ID: Sarah Riddle MRN: [id number] DOB/AGE: [birth year] 83 y.o.  Admit date: 02/13/2024 Referring Physician Dr. Althia Atlas Primary Physician Aisha Hove, MD Primary Cardiologist Dr. Phyllis Breeze Reason for Consultation chest pain   HPI: Sarah Riddle is a 83 y.o. female  with a past medical history of nonobstructive coronary artery disease, HFpEF, paroxysmal atrial fibrillation s/p ablation 2019 (on Eliquis ), OSA on CPAP, hypertension, hx of melonoma, chronic pain  who presented to the ED on 02/13/2024 for left sided chest pressure, SOB that resolved with nitroglycerin  at home. Patient recently hospitalized for pneumonia and bacteremia and discharged on 04/25. Cardiology was consulted for further evaluation of chest pain.   Interval History: -Patient seen and examined this AM. Patient states she's feel well without chest pain or SOB this AM. -Patient underwent cardiac stress test this AM.  -Patients BP borderline and HR stable.  -patient reports good UOP yesterday. Renal function is stable.  -Electrolytes are stable.  -Patient remains on room air  with stable SpO2.    Pertinent Cardiac History LHC 11/01/2022 (Dr. Meredeth Stallion)   1st Mrg lesion is 30% stenosed.   LV end diastolic pressure is normal.   The left ventricular ejection fraction is 50-55% by visual estimate. Mild disease in first diagonal about 35 to 40% rest of the coronaries are normal.  Patient does not have any significant coronary artery disease.  Elevated troponin probably due to demand ischemia.    Review of systems complete and found to be negative unless listed above    Past Medical History:  Diagnosis Date   A-fib [facility])    Accelerated hypertension 10/31/2022   Age related osteoporosis    Cancer (HCC)    Melanoma x 2 -- Lt Leg -1976 Rt Arm - 2014   Chronic bilateral low back pain    Chronic kidney disease    Complication of anesthesia    DVT (deep venous  thrombosis) (HCC) 1960   right leg  was on BC pills   Dysrhythmia    Family history of breast cancer    Family history of colon cancer    Family history of melanoma    Family history of prostate cancer    Fibrocystic breast disease    GERD (gastroesophageal reflux disease)    Headache    migraine   Heart murmur    not heard now.   History of kidney stones    Hyperlipidemia    Hypertension    IBS (irritable bowel syndrome)    Lumbago    Mitral valve prolapse    Neuropathy    Obstructive sleep apnea    Osteoarthritis of shoulder    PONV (postoperative nausea and vomiting)     Past Surgical History:  Procedure Laterality Date   ABDOMINAL HYSTERECTOMY     BACK SURGERY     BREAST BIOPSY Left 04/20/2023   us  bx/ heart clip/ path pending   BREAST BIOPSY Left 04/20/2023   US  LT BREAST BX W LOC DEV 1ST LESION IMG BX SPEC US  GUIDE 04/20/2023 [facility]   BREAST CYST ASPIRATION Left 2001   Negative   CARDIAC ELECTROPHYSIOLOGY MAPPING AND ABLATION     EYE SURGERY     HUMERUS IM NAIL Right 05/05/2022   Procedure: INTRAMEDULLARY (IM) NAIL HUMERAL;  Surgeon: Laneta Pintos, MD;  Location: [facility] OR;  Service: Orthopedics;  Laterality: Right;   HYSTEROTOMY     L4-L5  2020  titaum rebok cage   LEFT HEART CATH AND CORONARY ANGIOGRAPHY N/A 11/01/2022   Procedure: LEFT HEART CATH AND CORONARY ANGIOGRAPHY and possible PCI and stent;  Surgeon: Cherrie Cornwall, MD;  Location: [facility] INVASIVE CV LAB;  Service: Cardiovascular;  Laterality: N/A;   TOTAL HIP ARTHROPLASTY Left     Medications Prior to Admission  Medication Sig Dispense Refill Last Dose/Taking   acetaminophen  (TYLENOL ) 500 MG tablet Take 500 mg by mouth every 6 (six) hours as needed for moderate pain (pain score 4-6) or mild pain (pain score 1-3). Once a day   Taking As Needed   alendronate  (FOSAMAX ) 70 MG tablet Take 1 tablet (70 mg total) by mouth every Thursday. Take with a full glass of water on an empty stomach. (Patient  taking differently: Take 70 mg by mouth every Tuesday. Take with a full glass of water on an empty stomach.) 12 tablet 2 Past Week   allopurinol  (ZYLOPRIM ) 100 MG tablet Take 1 tablet (100 mg total) by mouth daily. 90 tablet 1 02/12/2024   apixaban  (ELIQUIS ) 5 MG TABS tablet Take 1 tablet (5 mg total) by mouth 2 (two) times daily. 60 tablet 0 02/12/2024   aspirin  EC 81 MG tablet Take 1 tablet (81 mg total) by mouth daily. Swallow whole. 30 tablet 0 02/12/2024   azelastine  (ASTELIN ) 0.1 % nasal spray Place 2 sprays into both nostrils at bedtime. Use in each nostril as directed (Patient taking differently: Place 2 sprays into both nostrils daily as needed for allergies or rhinitis. Use in each nostril as directed) 30 mL 12 Taking Differently   cyanocobalamin  (VITAMIN B12) 1000 MCG tablet Take 1,000 mcg by mouth daily. Monday to Friday   Past Week   EPINEPHrine  0.3 mg/0.3 mL IJ SOAJ injection Inject 0.3 mg into the muscle as needed for anaphylaxis.   Taking As Needed   fluticasone  (FLONASE ) 50 MCG/ACT nasal spray Place 2 sprays into both nostrils daily as needed for allergies or rhinitis.   Taking As Needed   furosemide  (LASIX ) 20 MG tablet Take 1 tablet (20 mg total) by mouth 2 (two) times daily as needed for fluid or edema. 60 tablet 3 02/12/2024   gabapentin  (NEURONTIN ) 800 MG tablet TAKE 1 TABLET FOUR TIMES DAILY 360 tablet 3 02/12/2024   hydrALAZINE  (APRESOLINE ) 25 MG tablet Take 25 mg by mouth 3 (three) times daily.   02/12/2024   isosorbide  mononitrate (IMDUR ) 60 MG 24 hr tablet Take 60 mg by mouth daily.   02/12/2024   levocetirizine (XYZAL ) 5 MG tablet TAKE 1 TABLET EVERY EVENING 90 tablet 3 02/12/2024   losartan  (COZAAR ) 100 MG tablet Take 1 tablet (100 mg total) by mouth daily. 60 tablet 0 02/12/2024   meclizine  (ANTIVERT ) 12.5 MG tablet Take 1 tablet (12.5 mg total) by mouth 3 (three) times daily as needed for dizziness. 30 tablet 0 Taking As Needed   metoprolol  succinate (TOPROL -XL) 25 MG 24 hr  tablet TAKE 1 TABLET EVERY DAY (Patient taking differently: Take 25 mg by mouth at bedtime.) 90 tablet 3 02/12/2024   nitroGLYCERIN  (NITROSTAT ) 0.4 MG SL tablet Place 1 tablet (0.4 mg total) under the tongue every 5 (five) minutes as needed for chest pain. 25 tablet 0 Taking As Needed   pantoprazole  (PROTONIX ) 40 MG tablet TAKE 1 TABLET EVERY DAY 90 tablet 3 02/12/2024   Polyethyl Glyc-Propyl Glyc PF (SYSTANE HYDRATION PF) 0.4-0.3 % SOLN Apply 1 drop to eye daily as needed (dry eyes).   Taking As Needed  rosuvastatin  (CRESTOR ) 40 MG tablet TAKE 1 TABLET EVERY DAY (Patient taking differently: Take 40 mg by mouth at bedtime.) 90 tablet 3 02/12/2024   sertraline  (ZOLOFT ) 25 MG tablet TAKE 1 TABLET THREE TIMES DAILY 180 tablet 5 02/12/2024   Vitamin D , Ergocalciferol , 50000 units CAPS Take 1 capsule by mouth once a week. 12 capsule 3 Past Week   OXYGEN  Inhale 2 L/min into the lungs at bedtime. And as needed for SOB      Social History   Socioeconomic History   Marital status: Married    Spouse name: Not on file   Number of children: Not on file   Years of education: Not on file   Highest education level: Not on file  Occupational History   Not on file  Tobacco Use   Smoking status: Never   Smokeless tobacco: Never  Vaping Use   Vaping status: Never Used  Substance and Sexual Activity   Alcohol use: No   Drug use: No   Sexual activity: Not Currently  Other Topics Concern   Not on file  Social History Narrative   Not on file   Social Drivers of Health   Financial Resource Strain: Low Risk  (09/23/2023)   Overall Financial Resource Strain (CARDIA)    Difficulty of Paying Living Expenses: Not hard at all  Food Insecurity: No Food Insecurity (02/13/2024)   Hunger Vital Sign    Worried About Running Out of Food in the Last Year: Never true    Ran Out of Food in the Last Year: Never true  Transportation Needs: No Transportation Needs (02/13/2024)   PRAPARE - Scientist, research (physical sciences) (Medical): No    Lack of Transportation (Non-Medical): No  Physical Activity: Inactive (09/23/2023)   Exercise Vital Sign    Days of Exercise per Week: 0 days    Minutes of Exercise per Session: 0 min  Stress: No Stress Concern Present (09/23/2023)   [employer] of Occupational Health - Occupational Stress Questionnaire    Feeling of Stress : Only a little  Social Connections: Moderately Isolated (02/13/2024)   Social Connection and Isolation Panel [NHANES]    Frequency of Communication with Friends and Family: More than three times a week    Frequency of Social Gatherings with Friends and Family: Twice a week    Attends Religious Services: Never    Database administrator or Organizations: No    Attends Banker Meetings: Never    Marital Status: Married  Catering manager Violence: Not At Risk (02/13/2024)   Humiliation, Afraid, Rape, and Kick questionnaire    Fear of Current or Ex-Partner: No    Emotionally Abused: No    Physically Abused: No    Sexually Abused: No    Family History  Problem Relation Age of Onset   Colon cancer Mother 19   Prostate cancer Father        dx 58s   Cancer Sister    Acute myelogenous leukemia Sister        dx 36s   Prostate cancer Brother    Melanoma Brother    Prostate cancer Brother    Melanoma Brother    HIV/AIDS Brother 80   Tuberculosis Paternal Aunt    Melanoma Other    Breast cancer Other    Melanoma Niece      Vitals:   02/14/24 2104 02/15/24 0255 02/15/24 0500 02/15/24 0723  BP: (!) 129/56 (!) 95/49  (!) 117/49  Pulse: 69 63  65  Resp: 16 17  17   Temp: 98.6 F (37 C)  98.7 F (37.1 C) 98.5 F (36.9 C)  TempSrc:   Oral   SpO2: 95% 98%  95%  Weight:      Height:        PHYSICAL EXAM General: well appearing elderly female, well nourished, in no acute distress. HEENT: Normocephalic and atraumatic. Neck: No JVD.   Lungs: Normal respiratory effort on room air. Clear bilaterally to  auscultation. No wheezes, crackles, rhonchi.  Heart: HRRR. Normal S1 and S2 without gallops or murmurs.  Abdomen: Non-distended appearing.  Msk: Normal strength and tone for age. Extremities: Warm and well perfused. No clubbing, cyanosis. No edema.  Neuro: Alert and oriented X 3. Psych: Answers questions appropriately.   Labs: Basic Metabolic Panel: Recent Labs    02/13/24 0203 02/14/24 0501 02/15/24 0329  NA 141 141 139  K 4.4 4.1 3.8  CL 106 105 105  CO2 29 28 28   GLUCOSE 98 88 107*  BUN 12 15 23   CREATININE 0.92 0.84 0.95  CALCIUM  9.2 9.0 9.1  MG 2.0  --  2.0  PHOS  --   --  5.1*   Liver Function Tests: Recent Labs    02/13/24 0203  AST 37  ALT 21  ALKPHOS 63  BILITOT 0.7  PROT 6.0*  ALBUMIN 3.1*   Recent Labs    02/13/24 0203  LIPASE 28   CBC: Recent Labs    02/13/24 0203 02/14/24 0501 02/15/24 0329  WBC 5.9 5.8 5.9  NEUTROABS 2.3  --   --   HGB 9.8* 9.6* 9.5*  HCT 32.4* 30.7* 30.6*  MCV 93.1 91.1 90.0  PLT 293 262 245   Cardiac Enzymes: Recent Labs    02/13/24 0203 02/13/24 0357  TROPONINIHS 18* 17   BNP: Recent Labs    02/13/24 0203  BNP 730.3*   D-Dimer: No results for input(s): "DDIMER" in the last 72 hours. Hemoglobin A1C: No results for input(s): "HGBA1C" in the last 72 hours. Fasting Lipid Panel: No results for input(s): "CHOL", "HDL", "LDLCALC", "TRIG", "CHOLHDL", "LDLDIRECT" in the last 72 hours. Thyroid  Function Tests: No results for input(s): "TSH", "T4TOTAL", "T3FREE", "THYROIDAB" in the last 72 hours.  Invalid input(s): "FREET3" Anemia Panel: No results for input(s): "VITAMINB12", "FOLATE", "FERRITIN", "TIBC", "IRON", "RETICCTPCT" in the last 72 hours.   Radiology: DG Chest Portable 1 View Result Date: 02/13/2024 CLINICAL DATA:  Initial evaluation for acute chest pain. EXAM: PORTABLE CHEST 1 VIEW COMPARISON:  Prior radiograph from 02/09/2024. FINDINGS: Cardiomegaly. Mediastinal silhouette within normal limits. Aortic  atherosclerosis. Lungs mildly hypoinflated. Small layering bilateral pleural effusions. Perihilar vascular congestion with mild pulmonary interstitial congestion. Patchy opacities involving the left upper lobe, slightly more prominent from prior. Finding is suspicious for infection/pneumonia. Few additional patchy opacities involving the left greater than right lung base could reflect additional infiltrates or atelectasis. No pneumothorax. Advanced osteoarthritic changes noted about the left shoulder. Sequelae of prior ORIF partially visualized at the right humerus. IMPRESSION: 1. Patchy opacities involving the left upper lobe, suspicious for infection/pneumonia. Additional patchy bibasilar opacities could reflect additional infiltrates or atelectasis. 2. Cardiomegaly with mild pulmonary interstitial congestion and small layering bilateral pleural effusions. 3.  Aortic Atherosclerosis (ICD10-I70.0). Electronically Signed   By: Virgia Griffins M.D.   On: 02/13/2024 03:38   DG Chest 2 View Result Date: 02/10/2024 CLINICAL DATA:  83 year old female with pneumonia EXAM: CHEST - 2 VIEW COMPARISON:  01/25/2024 FINDINGS: Cardiomediastinal  silhouette unchanged in size and contour. No evidence of central vascular congestion. No interlobular septal thickening. Improving multifocal interstitial and airspace opacities compared to the prior plain film. There is persisting peripheral reticulonodular opacities at the upper lungs left greater than right and the right lower lung. No new airspace disease. No pneumothorax or pleural effusion. Background coarsened interstitial markings. No acute displaced fracture. Degenerative changes of the spine. IMPRESSION: Resolving multifocal pneumonia Electronically Signed   By: Myrlene Asper D.O.   On: 02/10/2024 13:05   ECHOCARDIOGRAM COMPLETE Result Date: 01/26/2024    ECHOCARDIOGRAM REPORT   Patient Name:   Sarah Riddle Date of Exam: 01/25/2024 Medical Rec #:  [id number]         Height:       71.0 in Accession #:    [id number]       Weight:       207.9 lb Date of Birth:  02-19-1941       BSA:          2.144 m Patient Age:    82 years         BP:           120/56 mmHg Patient Gender: F                HR:           81 bpm. Exam Location:  [facility] Procedure: 2D Echo, Cardiac Doppler and Color Doppler (Both Spectral and Color            Flow Doppler were utilized during procedure). Indications:     I21.4 NSTEMI  History:         Patient has prior history of Echocardiogram examinations, most                  recent 02/04/2023. Mitral Valve Prolapse, Arrythmias:Atrial                  Fibrillation, Signs/Symptoms:Murmur; Risk Factors:Dyslipidemia                  and Hypertension. Obstructive sleep apnea.  Sonographer:     Brigid Canada RDCS Referring Phys:  [id number] CARALYN HUDSON Diagnosing Phys: Dwayne D Callwood MD IMPRESSIONS  1. Left ventricular ejection fraction, by estimation, is 60 to 65%. The left ventricle has moderate to severely decreased function. The left ventricle has no regional wall motion abnormalities. Left ventricular diastolic parameters are consistent with Grade III diastolic dysfunction (restrictive).  2. Right ventricular systolic function is normal. The right ventricular size is normal.  3. The mitral valve is normal in structure. Mild mitral valve regurgitation.  4. The aortic valve is normal in structure. Aortic valve regurgitation is not visualized. FINDINGS  Left Ventricle: Left ventricular ejection fraction, by estimation, is 60 to 65%. The left ventricle has moderate to severely decreased function. The left ventricle has no regional wall motion abnormalities. Strain was performed and the global longitudinal strain is indeterminate. The left ventricular internal cavity size was normal in size. There is no left ventricular hypertrophy. Left ventricular diastolic parameters are consistent with Grade III diastolic dysfunction (restrictive). Right Ventricle: The  right ventricular size is normal. No increase in right ventricular wall thickness. Right ventricular systolic function is normal. Left Atrium: Left atrial size was normal in size. Right Atrium: Right atrial size was normal in size. Pericardium: There is no evidence of pericardial effusion. Mitral Valve: The mitral valve is normal in structure. Mild mitral valve regurgitation. Tricuspid Valve:  The tricuspid valve is normal in structure. Tricuspid valve regurgitation is trivial. Aortic Valve: The aortic valve is normal in structure. Aortic valve regurgitation is not visualized. Pulmonic Valve: The pulmonic valve was normal in structure. Pulmonic valve regurgitation is not visualized. Aorta: The ascending aorta was not well visualized. IAS/Shunts: No atrial level shunt detected by color flow Doppler. Additional Comments: 3D was performed not requiring image post processing on an independent workstation and was indeterminate.  LEFT VENTRICLE PLAX 2D LVIDd:         5.50 cm   Diastology LVIDs:         3.50 cm   LV e' medial:    8.09 cm/s LV PW:         0.90 cm   LV E/e' medial:  17.1 LV IVS:        0.70 cm   LV e' lateral:   9.97 cm/s LVOT diam:     2.30 cm   LV E/e' lateral: 13.9 LV SV:         92 LV SV Index:   43 LVOT Area:     4.15 cm  RIGHT VENTRICLE             IVC RV Basal diam:  5.10 cm     IVC diam: 2.40 cm RV S prime:     17.03 cm/s TAPSE (M-mode): 1.9 cm LEFT ATRIUM           Index        RIGHT ATRIUM           Index LA diam:      5.00 cm 2.33 cm/m   RA Area:     23.60 cm LA Vol (A2C): 61.2 ml 28.55 ml/m  RA Volume:   86.60 ml  40.40 ml/m LA Vol (A4C): 52.3 ml 24.40 ml/m  AORTIC VALVE LVOT Vmax:   114.67 cm/s LVOT Vmean:  78.333 cm/s LVOT VTI:    0.221 m  AORTA Ao Root diam: 3.40 cm Ao Asc diam:  3.70 cm MITRAL VALVE                TRICUSPID VALVE MV Area (PHT): 5.46 cm     TR Peak grad:   35.3 mmHg MV Decel Time: 139 msec     TR Vmax:        297.00 cm/s MV E velocity: 138.50 cm/s MV A velocity: 55.10  cm/s   SHUNTS MV E/A ratio:  2.51         Systemic VTI:  0.22 m                             Systemic Diam: 2.30 cm Antonette Batters MD Electronically signed by Antonette Batters MD Signature Date/Time: 01/26/2024/2:12:35 PM    Final    DG Chest Port 1 View Result Date: 01/25/2024 CLINICAL DATA:  Shortness of breath and cough. EXAM: PORTABLE CHEST 1 VIEW COMPARISON:  January 09, 2023 FINDINGS: The heart size and mediastinal contours are within normal limits. Mild prominence of the central pulmonary vasculature is noted. There is marked severity calcification of the aortic arch. Mild diffusely increased interstitial lung markings are seen. Moderate severity superimposed left upper lobe infiltrate is noted. Mild superimposed right upper lobe and bibasilar infiltrates are also suspected. No pleural effusion or pneumothorax is identified. Postoperative changes are seen involving the right humerus. Multilevel degenerative changes seen throughout the thoracic spine. IMPRESSION: 1. Mild  pulmonary vascular congestion with mild interstitial edema. 2. Suspected superimposed bilateral upper lobe and bibasilar infiltrates, left greater than right. Further evaluation with chest CT is recommended. Electronically Signed   By: Virgle Grime M.D.   On: 01/25/2024 01:38    ECHO 01/25/2024  1. Left ventricular ejection fraction, by estimation, is 60 to 65%. The left ventricle has moderate to severely decreased function. The left ventricle has no regional wall motion abnormalities. Left ventricular diastolic parameters are consistent with Grade III diastolic dysfunction (restrictive).   2. Right ventricular systolic function is normal. The right ventricular size is normal.   3. The mitral valve is normal in structure. Mild mitral valve regurgitation.   4. The aortic valve is normal in structure. Aortic valve regurgitation is not visualized.   TELEMETRY reviewed by me 02/15/2024: sinus rhythm, rate 60s  EKG reviewed by me:  sinus rhythm, rate 71 bpm (without acute ischemic changes)  Data reviewed by me 02/15/2024: last 24h vitals tele labs imaging I/O hospitalist progress notes.  Principal Problem:   HCAP (healthcare-associated pneumonia) Active Problems:   Nonspecific chest pain   Pleural effusion, bilateral   Acute on chronic diastolic CHF (congestive heart failure) (HCC)    ASSESSMENT AND PLAN:  Sarah Riddle is a 83 y.o. female  with a past medical history of nonobstructive coronary artery disease, HFpEF, paroxysmal atrial fibrillation s/p ablation 2019 (on Eliquis ), OSA on CPAP, hypertension, hx of melonoma, chronic pain  who presented to the ED on 02/13/2024 for left sided chest pressure, SOB that resolved with nitroglycerin  at home. Patient recently hospitalized for pneumonia and bacteremia and discharged on 04/25. Cardiology was consulted for further evaluation of chest pain.   # Chest pain # Paroxysmal atrial fibrillation s/p ablation (2019) # Hypertension  # Acute on chronic HFpEF Patient presents to ED after having 3-4 episodes of abdominal/chest pain that quickly resolved with nitroglycerin  at home. Denies SOB. Patient states chest pain exacerbated by palpation. LHC in 10/2022 with mild disease in first diagonal about 35 to 40% and rest of the coronaries are normal per Dr. Phyllis Breeze.  Patient does not have any significant coronary artery disease.Echo from prior admission with EF 60-65%, no RWMA. BNP elevated at 730. Troponins negative x 2 (18, 17). EKG in ED with sinus rhythm, rate 71 bpm, without acute ischemic changes. S/p IV lasix  pt reports good UOP. BP borderline and HR stable this AM. Patient denies SOB or CP this AM -Cardiac stress test done this AM.  -Continue Eliquis  5 mg BID for stroke risk reduction. -Continue ASA 81 mg, rosuvastatin  40 mg daily. -Continue losartan  100 mg daily.  -Held metoprolol  succinate 25 mg today for cardiac stress test. Resume tomorrow.  -Transition from IV to PO lasix   tomorrow. Closely monitor renal function and UOP.  ADDENDUM: Patients cardiac stress test performed this morning without perfusion abnormality, low risk. Patient should follow up with outpatient Cardiology (Dr. Phyllis Breeze). Cardiology will sign off. Please haiku with questions or re-engage if needed.    This patient's plan of care was discussed and created with Dr. Custovic and she is in agreement.  Signed: Creighton Doffing, PA-C  02/15/2024, 10:44 AM [facility] Cardiology

## 2024-02-15 NOTE — Discharge Summary (Signed)
 [facility] Discharge Summary   Patient: Sarah Riddle GNF:[id number]  PCP: Aisha Hove, MD  Date of admission: 02/13/2024   Date of discharge:  02/15/2024     Discharge Diagnoses:  Principal Problem:   HCAP (healthcare-associated pneumonia) Active Problems:   Nonspecific chest pain   Pleural effusion, bilateral   Acute on chronic diastolic CHF (congestive heart failure) (HCC)   Admitted From: Home Disposition:  Home   Recommendations for Outpatient Follow-up:  Follow-up with PCP in 1 week, continue to monitor BP and follow-up with PCP to titrate medication accordingly.  Antibiotics Augmentin  twice daily for 5 days, probiotics for 7 days.  Repeat chest x-ray after 4 weeks. Follow-up with cardiology in 1 week.  Cleared by cardiology, stress test report looks normal as per cardiology, has not signed the report yet. Due to low blood pressure decrease dose of losartan  50 mg p.o. nightly, skip the dose if systolic BP less than 120 mmHg.  Changed hydralazine  to as needed if systolic BP greater than 150 mmHg. Continue taking Toprol -XL in the morning and continued Imdur  home dose. Take Lasix  20 mg p.o. daily Follow up LABS/TEST:  as above   Follow-up Information     Cherrie Cornwall, MD. Go in 1 week(s).   Specialty: Cardiology Contact information: [address] [place] [address] [phone]         Aisha Hove, MD Follow up in 1 week(s).   Specialty: Internal Medicine Contact information: [address] [place] [address] [phone]                Diet recommendation: Cardiac diet  Activity: The patient is advised to gradually reintroduce usual activities, as tolerated  Discharge Condition: stable  Code Status: Full code   History of present illness: As per the H and P dictated on admission Hospital Course:  Sarah Riddle is a 83 y.o. female with medical history significan for OSA on CPAP, chronic diastolic HF, CAD, PAF s/p ablation  2019 on apixaban , HTN, chronic  pain, and melanoma who presented to the ED for evaluation of shortness of breath and left-sided chest pain. Patient recently had a 2-day hospitalization for sepsis secondary to pneumonia about 3 weeks ago.  Reports she was discharged home with 7 days of Keflex . She followed up with her PCP and a repeat CXR showed improvement in her pneumonia. Two days ago, she started having left-sided chest pain described as crushing that resolved with nitroglycerin . The chest pain returned last night with associated shortness of breath so she presented to the ED for further evaluation.  She also reports some loose stools that were initially coffee-ground but now normal in color.  She endorsed mild cough and leg swelling but denies any fever, chills, headaches, palpitations, vision changes or syncope.   ED Course: Initial vitals show temp 97.7, RR 18, HR 75, BP 181/73, SpO2 94% on room air.  Labs significant for normal white count, stable Hgb at 9.5, normal CMP, troponin 18-17, BNP 730, procalcitonin <0.10, normal COVID, RSV and flu test.  UA with no signs of infection.  EKG shows sinus rhythm with prolonged QTc of 495. CXR shows patchy opacities in the left upper lungs concerning for pneumonia and mild pulmonary interstitial edema with cardiomegaly.  Patient received aspirin  325 mg x 1, IV Lasix  40 mg x 1, IV vancomycin  and cefepime. TRH was consulted for admission.   Assessment and Plan:   # Hospital-acquired pneumonia Recent admission for sepsis secondary to  pneumonia about 3 weeks ago. Presented with left-sided chest pain, shortness of breath and cough CXR showing patchy opacities in the left upper lung lobe Patient afebrile with no leukocytosis, stable on room air. s/p vancomycin , discontinued as MRSA PCR is negative. S/p IV vancomycin  and cefepime, Blood Cx NGTD, f/u sputum culture RVP negative, MRSA screen negative Negative urinary Legionella, and urinary strep Clinically  patient was back to her baseline, no respiratory distress, saturating well on room air.  Transition to Augmentin  twice daily for 5 days.  Repeat chest x-ray after 4 weeks and follow with PCP.   # Chest pain Troponin remained flat, no significant EKG changes Patient is following Dr. Meredeth Stallion, cardiac cath was done in January 2024 Patient was supposed to get cardiac stress test done as an outpatient on Monday, 02/20/2024. Discussed with cardiology, s/p stress test done today on 5/14, looks normal, official report is pending.  Cleared by cardiology to discharge and follow-up as an outpatient.   Started probiotics due to history of diarrhea after taking Keflex  last week.   # Acute on chronic diastolic heart failure Presented with chest pain that has not resolved, with associated shortness of breath. Has clinical, radiological and laboratory evidence of CHF exacerbation TTE on 01/25/2024 showed EF 60-65%, grade 3 diastolic dysfunction but no valvular pathology. Patient was taking Lasix  20 mg BID as needed for edema at home S/p IV Lasix  40 mg daily, discharged on Lasix  20 mg p.o. daily.   # Diarrhea: Resolved  Reports having multiple loose stools after discharge from the hospital on April 25. Loose stools have improved after she took Imodium few days ago, no bloody stools or melena. 5/13 as per patient no diarrhea since in the evening.  Discontinued contact precautions and no need to test for C. difficile and GI pathogen as there is no diarrhea.   # Paroxysmal A-fib, S/p ablation in 2019 EKG on admission shows sinus rhythm,  Continue Eliquis  and Toprol -XL in a.m.   # HTN: BP soft, decrease losartan  from 100 to 50 mg p.o. nightly with holding parameters.  Continued Imdur  home dose, continue Toprol -XL in the morning.  Changed hydralazine  as needed.  Monitor BP at home and follow with PCP and cardiology to titrate medications accordingly.  # CAD and HLD: Continue aspirin  and rosuvastatin  # OSA with CPAP:  Continue CPAP at bedtime # Neuropathy: Continue gabapentin  # GERD: Continue Protonix  # Gout: Continue allopurinol    Body mass index is 27.2 kg/m.  Nutrition Interventions:  - Patient was instructed, not to drive, operate heavy machinery, perform activities at heights, swimming or participation in water activities or provide baby sitting services while on Pain, Sleep and Anxiety Medications; until her outpatient Physician has advised to do so again.  - Also recommended to not to take more than prescribed Pain, Sleep and Anxiety Medications.  Patient was ambulatory without any assistance. On the day of the discharge the patient's vitals were stable, and no other acute medical condition were reported by patient. the patient was felt safe to be discharge at Home.  Consultants: Cardiology  Procedures: NM cardiac stress test  Discharge Exam: General: Appear in no distress, no Rash; Oral Mucosa Clear, moist. Cardiovascular: S1 and S2 Present, no Murmur, Respiratory: normal respiratory effort, Bilateral Air entry present and no Crackles, no wheezes Abdomen: Bowel Sound present, Soft and no tenderness, no hernia Extremities: no Pedal edema, no calf tenderness Neurology: alert and oriented to time, place, and person affect appropriate.  Filed Weights  02/13/24 0157  Weight: 88.5 kg   Vitals:   02/15/24 0723 02/15/24 1444  BP: (!) 117/49 (!) 101/52  Pulse: 65 69  Resp: 17 16  Temp: 98.5 F (36.9 C) 98.7 F (37.1 C)  SpO2: 95% 98%    DISCHARGE MEDICATION: Allergies as of 02/15/2024       Reactions   Iodinated Contrast Media Shortness Of Breath, Other (See Comments)   Can be premedicated Have been able to tolerate if given premeds (benadryl , prednisone )   Ibuprofen Other (See Comments)   "stomach problems"   Sulfa Antibiotics Other (See Comments)   "Stomach problems" "Stomach problems"  "Stomach problems"  "Stomach problems"   Doxycycline Diarrhea, Nausea And Vomiting    Tetracyclines & Related Nausea Only, Nausea And Vomiting        Medication List     TAKE these medications    acetaminophen  500 MG tablet Commonly known as: TYLENOL  Take 500 mg by mouth every 6 (six) hours as needed for moderate pain (pain score 4-6) or mild pain (pain score 1-3). Once a day   alendronate  70 MG tablet Commonly known as: FOSAMAX  Take 1 tablet (70 mg total) by mouth every Thursday. Take with a full glass of water on an empty stomach. What changed: when to take this   allopurinol  100 MG tablet Commonly known as: ZYLOPRIM  Take 1 tablet (100 mg total) by mouth daily.   amoxicillin -clavulanate 875-125 MG tablet Commonly known as: AUGMENTIN  Take 1 tablet by mouth 2 (two) times daily for 5 days.   apixaban  5 MG Tabs tablet Commonly known as: Eliquis  Take 1 tablet (5 mg total) by mouth 2 (two) times daily.   aspirin  EC 81 MG tablet Take 1 tablet (81 mg total) by mouth daily. Swallow whole.   azelastine  0.1 % nasal spray Commonly known as: ASTELIN  Place 2 sprays into both nostrils at bedtime. Use in each nostril as directed What changed:  when to take this reasons to take this   cyanocobalamin  1000 MCG tablet Commonly known as: VITAMIN B12 Take 1,000 mcg by mouth daily. Monday to Friday   diclofenac Sodium 1 % Gel Commonly known as: VOLTAREN Apply 2 g topically 3 (three) times daily.   EPINEPHrine  0.3 mg/0.3 mL Soaj injection Commonly known as: EPI-PEN Inject 0.3 mg into the muscle as needed for anaphylaxis.   fluticasone  50 MCG/ACT nasal spray Commonly known as: FLONASE  Place 2 sprays into both nostrils daily as needed for allergies or rhinitis.   gabapentin  800 MG tablet Commonly known as: NEURONTIN  TAKE 1 TABLET FOUR TIMES DAILY   hydrALAZINE  25 MG tablet Commonly known as: APRESOLINE  Take 1 tablet (25 mg total) by mouth 3 (three) times daily as needed (Systolic BP greater than 150 mmHg). What changed:  when to take this reasons to take  this   isosorbide  mononitrate 60 MG 24 hr tablet Commonly known as: IMDUR  Take 60 mg by mouth daily.   Lasix  20 MG tablet Generic drug: furosemide  Take 1 tablet (20 mg total) by mouth daily. What changed:  when to take this reasons to take this   levocetirizine 5 MG tablet Commonly known as: XYZAL  TAKE 1 TABLET EVERY EVENING   losartan  100 MG tablet Commonly known as: COZAAR  Take 0.5 tablets (50 mg total) by mouth at bedtime. Skip the dose if systolic BP less than 120 mmHg What changed:  how much to take when to take this additional instructions   meclizine  12.5 MG tablet Commonly known as: ANTIVERT  Take  1 tablet (12.5 mg total) by mouth 3 (three) times daily as needed for dizziness.   metoprolol  succinate 25 MG 24 hr tablet Commonly known as: TOPROL -XL Take 1 tablet (25 mg total) by mouth daily. What changed: when to take this   nitroGLYCERIN  0.4 MG SL tablet Commonly known as: NITROSTAT  Place 1 tablet (0.4 mg total) under the tongue every 5 (five) minutes as needed for chest pain.   OXYGEN  Inhale 2 L/min into the lungs at bedtime. And as needed for SOB   pantoprazole  40 MG tablet Commonly known as: PROTONIX  TAKE 1 TABLET EVERY DAY   rosuvastatin  40 MG tablet Commonly known as: CRESTOR  TAKE 1 TABLET EVERY DAY What changed: when to take this   saccharomyces boulardii 250 MG capsule Commonly known as: Florastor Take 1 capsule (250 mg total) by mouth 2 (two) times daily for 7 days.   sertraline  25 MG tablet Commonly known as: ZOLOFT  TAKE 1 TABLET THREE TIMES DAILY   Systane Hydration PF 0.4-0.3 % Soln Generic drug: Polyethyl Glyc-Propyl Glyc PF Apply 1 drop to eye daily as needed (dry eyes).   Vitamin D  (Ergocalciferol ) 50000 units Caps Take 1 capsule by mouth once a week.       Allergies  Allergen Reactions   Iodinated Contrast Media Shortness Of Breath and Other (See Comments)    Can be premedicated  Have been able to tolerate if given premeds  (benadryl , prednisone )   Ibuprofen Other (See Comments)    "stomach problems"    Sulfa Antibiotics Other (See Comments)    "Stomach problems"  "Stomach problems"  "Stomach problems"  "Stomach problems"   Doxycycline Diarrhea and Nausea And Vomiting   Tetracyclines & Related Nausea Only and Nausea And Vomiting   Discharge Instructions     Call MD for:  difficulty breathing, headache or visual disturbances   Complete by: As directed    Call MD for:  extreme fatigue   Complete by: As directed    Call MD for:  persistant dizziness or light-headedness   Complete by: As directed    Call MD for:  persistant nausea and vomiting   Complete by: As directed    Call MD for:  severe uncontrolled pain   Complete by: As directed    Call MD for:  temperature >100.4   Complete by: As directed    Diet - low sodium heart healthy   Complete by: As directed    Discharge instructions   Complete by: As directed    Follow-up with PCP in 1 week, continue to monitor BP and follow-up with PCP to titrate medication accordingly.  Antibiotics Augmentin  twice daily for 5 days, probiotics for 7 days.  Repeat chest x-ray after 4 weeks. Follow-up with cardiology in 1 week.  Cleared by cardiology, stress test report looks normal as per cardiology, has not signed the report yet. Due to low blood pressure decrease dose of losartan  50 mg p.o. nightly, skip the dose if systolic BP less than 120 mmHg.  Changed hydralazine  to as needed if systolic BP greater than 150 mmHg. Continue taking Toprol -XL in the morning and continued Imdur  home dose. Take Lasix  20 mg p.o. daily   Increase activity slowly   Complete by: As directed        The results of significant diagnostics from this hospitalization (including imaging, microbiology, ancillary and laboratory) are listed below for reference.    Significant Diagnostic Studies: DG Chest Portable 1 View Result Date: 02/13/2024 CLINICAL DATA:  Initial  evaluation for  acute chest pain. EXAM: PORTABLE CHEST 1 VIEW COMPARISON:  Prior radiograph from 02/09/2024. FINDINGS: Cardiomegaly. Mediastinal silhouette within normal limits. Aortic atherosclerosis. Lungs mildly hypoinflated. Small layering bilateral pleural effusions. Perihilar vascular congestion with mild pulmonary interstitial congestion. Patchy opacities involving the left upper lobe, slightly more prominent from prior. Finding is suspicious for infection/pneumonia. Few additional patchy opacities involving the left greater than right lung base could reflect additional infiltrates or atelectasis. No pneumothorax. Advanced osteoarthritic changes noted about the left shoulder. Sequelae of prior ORIF partially visualized at the right humerus. IMPRESSION: 1. Patchy opacities involving the left upper lobe, suspicious for infection/pneumonia. Additional patchy bibasilar opacities could reflect additional infiltrates or atelectasis. 2. Cardiomegaly with mild pulmonary interstitial congestion and small layering bilateral pleural effusions. 3.  Aortic Atherosclerosis (ICD10-I70.0). Electronically Signed   By: Virgia Griffins M.D.   On: 02/13/2024 03:38   DG Chest 2 View Result Date: 02/10/2024 CLINICAL DATA:  83 year old female with pneumonia EXAM: CHEST - 2 VIEW COMPARISON:  01/25/2024 FINDINGS: Cardiomediastinal silhouette unchanged in size and contour. No evidence of central vascular congestion. No interlobular septal thickening. Improving multifocal interstitial and airspace opacities compared to the prior plain film. There is persisting peripheral reticulonodular opacities at the upper lungs left greater than right and the right lower lung. No new airspace disease. No pneumothorax or pleural effusion. Background coarsened interstitial markings. No acute displaced fracture. Degenerative changes of the spine. IMPRESSION: Resolving multifocal pneumonia Electronically Signed   By: Myrlene Asper D.O.   On: 02/10/2024 13:05    ECHOCARDIOGRAM COMPLETE Result Date: 01/26/2024    ECHOCARDIOGRAM REPORT   Patient Name:   SAVANHA RIX Date of Exam: 01/25/2024 Medical Rec #:  [id number]        Height:       71.0 in Accession #:    [id number]       Weight:       207.9 lb Date of Birth:  Jan 02, 1941       BSA:          2.144 m Patient Age:    82 years         BP:           120/56 mmHg Patient Gender: F                HR:           81 bpm. Exam Location:  [facility] Procedure: 2D Echo, Cardiac Doppler and Color Doppler (Both Spectral and Color            Flow Doppler were utilized during procedure). Indications:     I21.4 NSTEMI  History:         Patient has prior history of Echocardiogram examinations, most                  recent 02/04/2023. Mitral Valve Prolapse, Arrythmias:Atrial                  Fibrillation, Signs/Symptoms:Murmur; Risk Factors:Dyslipidemia                  and Hypertension. Obstructive sleep apnea.  Sonographer:     Brigid Canada RDCS Referring Phys:  [id number] CARALYN HUDSON Diagnosing Phys: Dwayne D Callwood MD IMPRESSIONS  1. Left ventricular ejection fraction, by estimation, is 60 to 65%. The left ventricle has moderate to severely decreased function. The left ventricle has no regional wall motion abnormalities. Left ventricular diastolic parameters are consistent with Grade III diastolic  dysfunction (restrictive).  2. Right ventricular systolic function is normal. The right ventricular size is normal.  3. The mitral valve is normal in structure. Mild mitral valve regurgitation.  4. The aortic valve is normal in structure. Aortic valve regurgitation is not visualized. FINDINGS  Left Ventricle: Left ventricular ejection fraction, by estimation, is 60 to 65%. The left ventricle has moderate to severely decreased function. The left ventricle has no regional wall motion abnormalities. Strain was performed and the global longitudinal strain is indeterminate. The left ventricular internal cavity size was normal in size.  There is no left ventricular hypertrophy. Left ventricular diastolic parameters are consistent with Grade III diastolic dysfunction (restrictive). Right Ventricle: The right ventricular size is normal. No increase in right ventricular wall thickness. Right ventricular systolic function is normal. Left Atrium: Left atrial size was normal in size. Right Atrium: Right atrial size was normal in size. Pericardium: There is no evidence of pericardial effusion. Mitral Valve: The mitral valve is normal in structure. Mild mitral valve regurgitation. Tricuspid Valve: The tricuspid valve is normal in structure. Tricuspid valve regurgitation is trivial. Aortic Valve: The aortic valve is normal in structure. Aortic valve regurgitation is not visualized. Pulmonic Valve: The pulmonic valve was normal in structure. Pulmonic valve regurgitation is not visualized. Aorta: The ascending aorta was not well visualized. IAS/Shunts: No atrial level shunt detected by color flow Doppler. Additional Comments: 3D was performed not requiring image post processing on an independent workstation and was indeterminate.  LEFT VENTRICLE PLAX 2D LVIDd:         5.50 cm   Diastology LVIDs:         3.50 cm   LV e' medial:    8.09 cm/s LV PW:         0.90 cm   LV E/e' medial:  17.1 LV IVS:        0.70 cm   LV e' lateral:   9.97 cm/s LVOT diam:     2.30 cm   LV E/e' lateral: 13.9 LV SV:         92 LV SV Index:   43 LVOT Area:     4.15 cm  RIGHT VENTRICLE             IVC RV Basal diam:  5.10 cm     IVC diam: 2.40 cm RV S prime:     17.03 cm/s TAPSE (M-mode): 1.9 cm LEFT ATRIUM           Index        RIGHT ATRIUM           Index LA diam:      5.00 cm 2.33 cm/m   RA Area:     23.60 cm LA Vol (A2C): 61.2 ml 28.55 ml/m  RA Volume:   86.60 ml  40.40 ml/m LA Vol (A4C): 52.3 ml 24.40 ml/m  AORTIC VALVE LVOT Vmax:   114.67 cm/s LVOT Vmean:  78.333 cm/s LVOT VTI:    0.221 m  AORTA Ao Root diam: 3.40 cm Ao Asc diam:  3.70 cm MITRAL VALVE                 TRICUSPID VALVE MV Area (PHT): 5.46 cm     TR Peak grad:   35.3 mmHg MV Decel Time: 139 msec     TR Vmax:        297.00 cm/s MV E velocity: 138.50 cm/s MV A velocity: 55.10 cm/s   SHUNTS MV E/A ratio:  2.51         Systemic VTI:  0.22 m                             Systemic Diam: 2.30 cm Antonette Batters MD Electronically signed by Antonette Batters MD Signature Date/Time: 01/26/2024/2:12:35 PM    Final    DG Chest Port 1 View Result Date: 01/25/2024 CLINICAL DATA:  Shortness of breath and cough. EXAM: PORTABLE CHEST 1 VIEW COMPARISON:  January 09, 2023 FINDINGS: The heart size and mediastinal contours are within normal limits. Mild prominence of the central pulmonary vasculature is noted. There is marked severity calcification of the aortic arch. Mild diffusely increased interstitial lung markings are seen. Moderate severity superimposed left upper lobe infiltrate is noted. Mild superimposed right upper lobe and bibasilar infiltrates are also suspected. No pleural effusion or pneumothorax is identified. Postoperative changes are seen involving the right humerus. Multilevel degenerative changes seen throughout the thoracic spine. IMPRESSION: 1. Mild pulmonary vascular congestion with mild interstitial edema. 2. Suspected superimposed bilateral upper lobe and bibasilar infiltrates, left greater than right. Further evaluation with chest CT is recommended. Electronically Signed   By: Virgle Grime M.D.   On: 01/25/2024 01:38    Microbiology: Recent Results (from the past 240 hours)  Blood culture (routine x 2)     Status: None (Preliminary result)   Collection Time: 02/13/24  3:56 AM   Specimen: BLOOD LEFT ARM  Result Value Ref Range Status   Specimen Description BLOOD LEFT ARM  Final   Special Requests   Final    BOTTLES DRAWN AEROBIC AND ANAEROBIC Blood Culture adequate volume   Culture   Final    NO GROWTH 2 DAYS Performed at [facility], [address] [place], [place] [address]     Report Status PENDING  Incomplete  Blood culture (routine x 2)     Status: None (Preliminary result)   Collection Time: 02/13/24  3:56 AM   Specimen: BLOOD RIGHT ARM  Result Value Ref Range Status   Specimen Description BLOOD RIGHT ARM  Final   Special Requests   Final    BOTTLES DRAWN AEROBIC AND ANAEROBIC Blood Culture results may not be optimal due to an inadequate volume of blood received in culture bottles   Culture   Final    NO GROWTH 2 DAYS Performed at [facility], [address], [place], [place] [address]    Report Status PENDING  Incomplete  Resp panel by RT-PCR (RSV, Flu A&B, Covid) Anterior Nasal Swab     Status: None   Collection Time: 02/13/24  3:58 AM   Specimen: Anterior Nasal Swab  Result Value Ref Range Status   SARS Coronavirus 2 by RT PCR NEGATIVE NEGATIVE Final    Comment: (NOTE) SARS-CoV-2 target nucleic acids are NOT DETECTED.  The SARS-CoV-2 RNA is generally detectable in upper respiratory specimens during the acute phase of infection. The lowest concentration of SARS-CoV-2 viral copies this assay can detect is 138 copies/mL. A negative result does not preclude SARS-Cov-2 infection and should not be used as the sole basis for treatment or other patient management decisions. A negative result may occur with  improper specimen collection/handling, submission of specimen other than nasopharyngeal swab, presence of viral mutation(s) within the areas targeted by this assay, and inadequate number of viral copies(<138 copies/mL). A negative result must be combined with clinical observations, patient history, and epidemiological information. The  expected result is Negative.  Fact Sheet for Patients:  BloggerCourse.com  Fact Sheet for Healthcare Providers:  SeriousBroker.it  This test is no t yet approved or cleared by the [place]  FDA and  has been authorized for detection and/or diagnosis of  SARS-CoV-2 by FDA under an Emergency Use Authorization (EUA). This EUA will remain  in effect (meaning this test can be used) for the duration of the COVID-19 declaration under Section 564(b)(1) of the Act, 21 U.S.C.section 360bbb-3(b)(1), unless the authorization is terminated  or revoked sooner.       Influenza A by PCR NEGATIVE NEGATIVE Final   Influenza B by PCR NEGATIVE NEGATIVE Final    Comment: (NOTE) The Xpert Xpress SARS-CoV-2/FLU/RSV plus assay is intended as an aid in the diagnosis of influenza from Nasopharyngeal swab specimens and should not be used as a sole basis for treatment. Nasal washings and aspirates are unacceptable for Xpert Xpress SARS-CoV-2/FLU/RSV testing.  Fact Sheet for Patients: BloggerCourse.com  Fact Sheet for Healthcare Providers: SeriousBroker.it  This test is not yet approved or cleared by the [place]  FDA and has been authorized for detection and/or diagnosis of SARS-CoV-2 by FDA under an Emergency Use Authorization (EUA). This EUA will remain in effect (meaning this test can be used) for the duration of the COVID-19 declaration under Section 564(b)(1) of the Act, 21 U.S.C. section 360bbb-3(b)(1), unless the authorization is terminated or revoked.     Resp Syncytial Virus by PCR NEGATIVE NEGATIVE Final    Comment: (NOTE) Fact Sheet for Patients: BloggerCourse.com  Fact Sheet for Healthcare Providers: SeriousBroker.it  This test is not yet approved or cleared by the [place]  FDA and has been authorized for detection and/or diagnosis of SARS-CoV-2 by FDA under an Emergency Use Authorization (EUA). This EUA will remain in effect (meaning this test can be used) for the duration of the COVID-19 declaration under Section 564(b)(1) of the Act, 21 U.S.C. section 360bbb-3(b)(1), unless the authorization is terminated  or revoked.  Performed at [facility], [address] [place], [place] [address]   MRSA Next Gen by PCR, Nasal     Status: None   Collection Time: 02/13/24 11:33 AM   Specimen: Nasal Mucosa; Nasal Swab  Result Value Ref Range Status   MRSA by PCR Next Gen NOT DETECTED NOT DETECTED Final    Comment: (NOTE) The GeneXpert MRSA Assay (FDA approved for NASAL specimens only), is one component of a comprehensive MRSA colonization surveillance program. It is not intended to diagnose MRSA infection nor to guide or monitor treatment for MRSA infections. Test performance is not FDA approved in patients less than 62 years old. Performed at [facility], [address] [place], [place] [address]   Respiratory (~20 pathogens) panel by PCR     Status: None   Collection Time: 02/13/24  4:08 PM   Specimen: Nasopharyngeal Swab; Respiratory  Result Value Ref Range Status   Adenovirus NOT DETECTED NOT DETECTED Final   Coronavirus 229E NOT DETECTED NOT DETECTED Final    Comment: (NOTE) The Coronavirus on the Respiratory Panel, DOES NOT test for the novel  Coronavirus (2019 nCoV)    Coronavirus HKU1 NOT DETECTED NOT DETECTED Final   Coronavirus NL63 NOT DETECTED NOT DETECTED Final   Coronavirus OC43 NOT DETECTED NOT DETECTED Final   Metapneumovirus NOT DETECTED NOT DETECTED Final   Rhinovirus / Enterovirus NOT DETECTED NOT DETECTED Final   Influenza A NOT DETECTED NOT DETECTED Final   Influenza B NOT DETECTED NOT  DETECTED Final   Parainfluenza Virus 1 NOT DETECTED NOT DETECTED Final   Parainfluenza Virus 2 NOT DETECTED NOT DETECTED Final   Parainfluenza Virus 3 NOT DETECTED NOT DETECTED Final   Parainfluenza Virus 4 NOT DETECTED NOT DETECTED Final   Respiratory Syncytial Virus NOT DETECTED NOT DETECTED Final   Bordetella pertussis NOT DETECTED NOT DETECTED Final   Bordetella Parapertussis NOT DETECTED NOT DETECTED Final   Chlamydophila pneumoniae NOT DETECTED NOT DETECTED  Final   Mycoplasma pneumoniae NOT DETECTED NOT DETECTED Final    Comment: Performed at [facility], [address], [place], [place] [address]     Labs: CBC: Recent Labs  Lab 02/13/24 0203 02/14/24 0501 02/15/24 0329  WBC 5.9 5.8 5.9  NEUTROABS 2.3  --   --   HGB 9.8* 9.6* 9.5*  HCT 32.4* 30.7* 30.6*  MCV 93.1 91.1 90.0  PLT 293 262 245   Basic Metabolic Panel: Recent Labs  Lab 02/13/24 0203 02/14/24 0501 02/15/24 0329  NA 141 141 139  K 4.4 4.1 3.8  CL 106 105 105  CO2 29 28 28   GLUCOSE 98 88 107*  BUN 12 15 23   CREATININE 0.92 0.84 0.95  CALCIUM  9.2 9.0 9.1  MG 2.0  --  2.0  PHOS  --   --  5.1*   Liver Function Tests: Recent Labs  Lab 02/13/24 0203  AST 37  ALT 21  ALKPHOS 63  BILITOT 0.7  PROT 6.0*  ALBUMIN 3.1*   Recent Labs  Lab 02/13/24 0203  LIPASE 28   No results for input(s): "AMMONIA" in the last 168 hours. Cardiac Enzymes: No results for input(s): "CKTOTAL", "CKMB", "CKMBINDEX", "TROPONINI" in the last 168 hours. BNP (last 3 results) Recent Labs    01/25/24 0112 02/13/24 0203  BNP 522.6* 730.3*   CBG: No results for input(s): "GLUCAP" in the last 168 hours.  Time spent: 35 minutes  Signed:  Althia Atlas  [facility] 02/15/2024 2:48 PM

## 2024-02-16 ENCOUNTER — Telehealth: Payer: Self-pay

## 2024-02-16 ENCOUNTER — Encounter: Payer: Self-pay | Admitting: Cardiovascular Disease

## 2024-02-16 ENCOUNTER — Ambulatory Visit: Admitting: Cardiovascular Disease

## 2024-02-16 VITALS — BP 100/60 | HR 69 | Ht 71.0 in | Wt 184.8 lb

## 2024-02-16 DIAGNOSIS — R0789 Other chest pain: Secondary | ICD-10-CM | POA: Diagnosis not present

## 2024-02-16 DIAGNOSIS — I25112 Atherosclerotic heart disease of native coronary artery with refractory angina pectoris: Secondary | ICD-10-CM | POA: Diagnosis not present

## 2024-02-16 DIAGNOSIS — I951 Orthostatic hypotension: Secondary | ICD-10-CM

## 2024-02-16 DIAGNOSIS — R0602 Shortness of breath: Secondary | ICD-10-CM | POA: Diagnosis not present

## 2024-02-16 DIAGNOSIS — I10 Essential (primary) hypertension: Secondary | ICD-10-CM

## 2024-02-16 DIAGNOSIS — E782 Mixed hyperlipidemia: Secondary | ICD-10-CM | POA: Diagnosis not present

## 2024-02-16 DIAGNOSIS — J189 Pneumonia, unspecified organism: Secondary | ICD-10-CM

## 2024-02-16 MED ORDER — LOSARTAN POTASSIUM 25 MG PO TABS: 25.0000 mg | ORAL_TABLET | Freq: Every day | ORAL | 1 refills | Status: DC

## 2024-02-16 NOTE — Progress Notes (Signed)
 Cardiology Office Note   Date:  02/16/2024   ID:  [name], [name] [birth year], MRN [id number]  PCP:  Sarah Hove, MD  Cardiologist:  Sarah Fairly, MD      History of Present Illness: Sarah Riddle is a 83 y.o. female who presents for  Chief Complaint  Patient presents with   Follow-up    Hospital follow up    Admitted to [facility] ass had chest pain. Apparently pneumonia did not improve. Bp was low due to diarroea.      Past Medical History:  Diagnosis Date   A-fib [facility])    Accelerated hypertension 10/31/2022   Age related osteoporosis    Cancer (HCC)    Melanoma x 2 -- Lt Leg -1976 Rt Arm - 2014   Chronic bilateral low back pain    Chronic kidney disease    Complication of anesthesia    DVT (deep venous thrombosis) (HCC) 1960   right leg  was on BC pills   Dysrhythmia    Family history of breast cancer    Family history of colon cancer    Family history of melanoma    Family history of prostate cancer    Fibrocystic breast disease    GERD (gastroesophageal reflux disease)    Headache    migraine   Heart murmur    not heard now.   History of kidney stones    Hyperlipidemia    Hypertension    IBS (irritable bowel syndrome)    Lumbago    Mitral valve prolapse    Neuropathy    Obstructive sleep apnea    Osteoarthritis of shoulder    PONV (postoperative nausea and vomiting)      Past Surgical History:  Procedure Laterality Date   ABDOMINAL HYSTERECTOMY     BACK SURGERY     BREAST BIOPSY Left 04/20/2023   us  bx/ heart clip/ path pending   BREAST BIOPSY Left 04/20/2023   US  LT BREAST BX W LOC DEV 1ST LESION IMG BX SPEC US  GUIDE 04/20/2023 [facility]   BREAST CYST ASPIRATION Left 2001   Negative   CARDIAC ELECTROPHYSIOLOGY MAPPING AND ABLATION     EYE SURGERY     HUMERUS IM NAIL Right 05/05/2022   Procedure: INTRAMEDULLARY (IM) NAIL HUMERAL;  Surgeon: Laneta Pintos, MD;  Location: [facility] OR;  Service: Orthopedics;  Laterality: Right;    HYSTEROTOMY     L4-L5  2020   titaum rebok cage   LEFT HEART CATH AND CORONARY ANGIOGRAPHY N/A 11/01/2022   Procedure: LEFT HEART CATH AND CORONARY ANGIOGRAPHY and possible PCI and stent;  Surgeon: Cherrie Cornwall, MD;  Location: [facility] INVASIVE CV LAB;  Service: Cardiovascular;  Laterality: N/A;   TOTAL HIP ARTHROPLASTY Left      Current Outpatient Medications  Medication Sig Dispense Refill   acetaminophen  (TYLENOL ) 500 MG tablet Take 500 mg by mouth every 6 (six) hours as needed for moderate pain (pain score 4-6) or mild pain (pain score 1-3). Once a day     alendronate  (FOSAMAX ) 70 MG tablet Take 1 tablet (70 mg total) by mouth every Thursday. Take with a full glass of water on an empty stomach. (Patient taking differently: Take 70 mg by mouth every Tuesday. Take with a full glass of water on an empty stomach.) 12 tablet 2   allopurinol  (ZYLOPRIM ) 100 MG tablet Take 1 tablet (100 mg total) by mouth daily. 90 tablet 1   amoxicillin -clavulanate (AUGMENTIN ) 875-125 MG tablet  Take 1 tablet by mouth 2 (two) times daily for 5 days. 10 tablet 0   apixaban  (ELIQUIS ) 5 MG TABS tablet Take 1 tablet (5 mg total) by mouth 2 (two) times daily. 60 tablet 0   azelastine  (ASTELIN ) 0.1 % nasal spray Place 2 sprays into both nostrils at bedtime. Use in each nostril as directed (Patient taking differently: Place 2 sprays into both nostrils daily as needed for allergies or rhinitis. Use in each nostril as directed) 30 mL 12   cyanocobalamin  (VITAMIN B12) 1000 MCG tablet Take 1,000 mcg by mouth daily. Monday to Friday     diclofenac Sodium (VOLTAREN) 1 % GEL Apply 2 g topically 3 (three) times daily.     EPINEPHrine  0.3 mg/0.3 mL IJ SOAJ injection Inject 0.3 mg into the muscle as needed for anaphylaxis.     fluticasone  (FLONASE ) 50 MCG/ACT nasal spray Place 2 sprays into both nostrils daily as needed for allergies or rhinitis.     furosemide  (LASIX ) 20 MG tablet Take 1 tablet (20 mg total) by mouth daily.      gabapentin  (NEURONTIN ) 800 MG tablet TAKE 1 TABLET FOUR TIMES DAILY 360 tablet 3   levocetirizine (XYZAL ) 5 MG tablet TAKE 1 TABLET EVERY EVENING 90 tablet 3   losartan  (COZAAR ) 25 MG tablet Take 1 tablet (25 mg total) by mouth daily. 90 tablet 1   meclizine  (ANTIVERT ) 12.5 MG tablet Take 1 tablet (12.5 mg total) by mouth 3 (three) times daily as needed for dizziness. 30 tablet 0   metoprolol  succinate (TOPROL -XL) 25 MG 24 hr tablet Take 1 tablet (25 mg total) by mouth daily. 90 tablet 3   nitroGLYCERIN  (NITROSTAT ) 0.4 MG SL tablet Place 1 tablet (0.4 mg total) under the tongue every 5 (five) minutes as needed for chest pain. 25 tablet 0   OXYGEN  Inhale 2 L/min into the lungs at bedtime. And as needed for SOB     pantoprazole  (PROTONIX ) 40 MG tablet TAKE 1 TABLET EVERY DAY 90 tablet 3   Polyethyl Glyc-Propyl Glyc PF (SYSTANE HYDRATION PF) 0.4-0.3 % SOLN Apply 1 drop to eye daily as needed (dry eyes).     rosuvastatin  (CRESTOR ) 40 MG tablet TAKE 1 TABLET EVERY DAY (Patient taking differently: Take 40 mg by mouth at bedtime.) 90 tablet 3   saccharomyces boulardii (FLORASTOR) 250 MG capsule Take 1 capsule (250 mg total) by mouth 2 (two) times daily for 7 days. 50 capsule 0   sertraline  (ZOLOFT ) 25 MG tablet TAKE 1 TABLET THREE TIMES DAILY 180 tablet 5   Vitamin D , Ergocalciferol , 50000 units CAPS Take 1 capsule by mouth once a week. 12 capsule 3   No current facility-administered medications for this visit.    Allergies:   Iodinated contrast media, Ibuprofen, Sulfa antibiotics, Doxycycline, and Tetracyclines & related    Social History:   reports that she has never smoked. She has never used smokeless tobacco. She reports that she does not drink alcohol and does not use drugs.   Family History:  family history includes Acute myelogenous leukemia in her sister; Breast cancer in an other family member; Cancer in her sister; Colon cancer (age of onset: 62) in her mother; HIV/AIDS (age of onset: 49)  in her brother; Melanoma in her brother, brother, niece, and another family member; Prostate cancer in her brother, brother, and father; Tuberculosis in her paternal aunt.    ROS:     Review of Systems  Constitutional: Negative.   HENT: Negative.  Eyes: Negative.   Respiratory: Negative.    Gastrointestinal: Negative.   Genitourinary: Negative.   Musculoskeletal: Negative.   Skin: Negative.   Neurological: Negative.   Endo/Heme/Allergies: Negative.   Psychiatric/Behavioral: Negative.    All other systems reviewed and are negative.     All other systems are reviewed and negative.    PHYSICAL EXAM: VS:  BP 100/60   Pulse 69   Ht 5\' 11"  (1.803 m)   Wt 184 lb 12.8 oz (83.8 kg)   SpO2 96%   BMI 25.77 kg/m  , BMI Body mass index is 25.77 kg/m. Last weight:  Wt Readings from Last 3 Encounters:  02/16/24 184 lb 12.8 oz (83.8 kg)  02/13/24 195 lb (88.5 kg)  02/03/24 199 lb (90.3 kg)     Physical Exam Constitutional:      Appearance: Normal appearance.  Cardiovascular:     Rate and Rhythm: Normal rate and regular rhythm.     Heart sounds: Normal heart sounds.  Pulmonary:     Effort: Pulmonary effort is normal.     Breath sounds: Normal breath sounds.  Musculoskeletal:     Right lower leg: No edema.     Left lower leg: No edema.  Neurological:     Mental Status: She is alert.       EKG:   Recent Labs: 12/22/2023: TSH 0.827 02/13/2024: ALT 21; B Natriuretic Peptide 730.3 02/15/2024: BUN 23; Creatinine, Ser 0.95; Hemoglobin 9.5; Magnesium 2.0; Platelets 245; Potassium 3.8; Sodium 139    Lipid Panel    Component Value Date/Time   CHOL 131 12/22/2023 1049   TRIG 112 12/22/2023 1049   HDL 58 12/22/2023 1049   CHOLHDL 2.3 12/22/2023 1049   LDLCALC 53 12/22/2023 1049      Other studies Reviewed: Additional studies/ records that were reviewed today include:  Review of the above records demonstrates:       No data to display            ASSESSMENT  AND PLAN:    ICD-10-CM   1. Essential hypertension, benign  I10 losartan  (COZAAR ) 25 MG tablet    2. Mixed hyperlipidemia  E78.2 losartan  (COZAAR ) 25 MG tablet    3. 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 Other chest pain  R07.89 losartan  (COZAAR ) 25 MG tablet   stress test done at [facility] was normal.    5. SOB (shortness of breath)  R06.02 losartan  (COZAAR ) 25 MG tablet    6. Bilateral upper lobe community acquired pneumonia  J18.9 losartan  (COZAAR ) 25 MG tablet    7. Orthostatic hypotension  I95.1 losartan  (COZAAR ) 25 MG tablet   still dizzy, stop hydralazine , isosrbide since stress test normal. Also decrease losartan  25 daily       Problem List Items Addressed This Visit       Cardiovascular and Mediastinum   Essential hypertension, benign - Primary   Relevant Medications   losartan  (COZAAR ) 25 MG tablet   Orthostatic hypotension   Relevant Medications   losartan  (COZAAR ) 25 MG tablet   CAD (coronary artery disease)   Relevant Medications   losartan  (COZAAR ) 25 MG tablet     Other   HLD (hyperlipidemia)   Relevant Medications   losartan  (COZAAR ) 25 MG tablet   Other Visit Diagnoses       Other chest pain       stress test  done at [facility] was normal.   Relevant Medications   losartan  (COZAAR ) 25 MG tablet     SOB (shortness of breath)       Relevant Medications   losartan  (COZAAR ) 25 MG tablet     Bilateral upper lobe community acquired pneumonia       Relevant Medications   losartan  (COZAAR ) 25 MG tablet          Disposition:   Return in about 4 weeks (around 03/15/2024).    Total time spent: 30 minutes  Signed,  Sarah Fairly, MD  02/16/2024 9:27 AM    [facility]

## 2024-02-16 NOTE — Transitions of Care (Post Inpatient/ED Visit) (Signed)
 02/16/2024  Name: Sarah Riddle MRN: [id number] DOB: 07/18/1941  Today's TOC FU Call Status: Today's TOC FU Call Status:: Successful TOC FU Call Completed TOC FU Call Complete Date: 02/16/24 Patient's Name and Date of Birth confirmed.  Transition Care Management Follow-up Telephone Call Date of Discharge: 02/15/24 Discharge Facility: [facility]) Type of Discharge: Inpatient Admission Primary Inpatient Discharge Diagnosis:: Pneumonia and Heart Failure How have you been since you were released from the hospital?: Better Any questions or concerns?: No  Items Reviewed: Did you receive and understand the discharge instructions provided?: Yes Medications obtained,verified, and reconciled?: Yes (Medications Reviewed) Any new allergies since your discharge?: No Dietary orders reviewed?: Yes Type of Diet Ordered:: Low Sodium Heart Healthy Do you have support at home?: Yes People in Home [RPT]: child(ren), adult, spouse Name of Support/Comfort Primary Source: Clarisa Crooked, daughter  Medications Reviewed Today: Medications Reviewed Today     Reviewed by Claudene Crystal, RN (Case Manager) on 02/16/24 at 1409  Med List Status: <None>   Medication Order Taking? Sig Documenting Provider Last Dose Status Informant  acetaminophen  (TYLENOL ) 500 MG tablet [id number]  Take 500 mg by mouth every 6 (six) hours as needed for moderate pain (pain score 4-6) or mild pain (pain score 1-3). Once a day [provider]  Active Child           Med Note Ruby Corporal Feb 13, 2024  7:45 AM) prn  alendronate  (FOSAMAX ) 70 MG tablet [id number]  Take 1 tablet (70 mg total) by mouth every Thursday. Take with a full glass of water on an empty stomach.  Patient taking differently: Take 70 mg by mouth every Tuesday. Take with a full glass of water on an empty stomach.   Aisha Hove, MD  Active Child  allopurinol  (ZYLOPRIM ) 100 MG tablet [id number]  Take 1 tablet (100  mg total) by mouth daily. Aisha Hove, MD  Active Child  amoxicillin -clavulanate (AUGMENTIN ) 875-125 MG tablet [id number]  Take 1 tablet by mouth 2 (two) times daily for 5 days. Althia Atlas, MD  Active   apixaban  (ELIQUIS ) 5 MG TABS tablet [id number]  Take 1 tablet (5 mg total) by mouth 2 (two) times daily. Aisha Hove, MD  Active Child  azelastine  (ASTELIN ) 0.1 % nasal spray [id number]  Place 2 sprays into both nostrils at bedtime. Use in each nostril as directed  Patient taking differently: Place 2 sprays into both nostrils daily as needed for allergies or rhinitis. Use in each nostril as directed   Aisha Hove, MD  Active Child           Med Note Ruby Corporal Feb 13, 2024  7:42 AM) prn  cyanocobalamin  (VITAMIN B12) 1000 MCG tablet [id number]  Take 1,000 mcg by mouth daily. Monday to Friday [provider]  Active Child  diclofenac Sodium (VOLTAREN) 1 % GEL [id number]  Apply 2 g topically 3 (three) times daily. Althia Atlas, MD  Active   EPINEPHrine  0.3 mg/0.3 mL IJ SOAJ injection [id number]  Inject 0.3 mg into the muscle as needed for anaphylaxis. [provider]  Active Child           Med Note Ruby Corporal Feb 13, 2024  7:45 AM) prn  fluticasone  (FLONASE ) 50 MCG/ACT nasal spray [id number]  Place 2 sprays into both nostrils daily as needed for allergies or rhinitis. [provider]  Active Child  Med Note Blondie Burke   Mon Feb 13, 2024  7:50 AM) prn  furosemide  (LASIX ) 20 MG tablet [id number] No Take 1 tablet (20 mg total) by mouth daily.  Patient not taking: Reported on 02/16/2024   Althia Atlas, MD Not Taking Active   gabapentin  (NEURONTIN ) 800 MG tablet [id number]  TAKE 1 TABLET FOUR TIMES DAILY Trenda Frisk, FNP  Active Child  levocetirizine (XYZAL ) 5 MG tablet [id number]  TAKE 1 TABLET EVERY EVENING Aisha Hove, MD  Active Child  losartan  (COZAAR ) 25 MG tablet [id number]  Take 1 tablet (25 mg total) by  mouth daily. Cherrie Cornwall, MD  Active   meclizine  (ANTIVERT ) 12.5 MG tablet [id number] No Take 1 tablet (12.5 mg total) by mouth 3 (three) times daily as needed for dizziness. Glendale Landmark, NP Unknown Active Child           Med Note Ruby Corporal Feb 13, 2024  7:42 AM) prn  metoprolol  succinate (TOPROL -XL) 25 MG 24 hr tablet [id number]  Take 1 tablet (25 mg total) by mouth daily. Althia Atlas, MD  Active   nitroGLYCERIN  (NITROSTAT ) 0.4 MG SL tablet [id number]  Place 1 tablet (0.4 mg total) under the tongue every 5 (five) minutes as needed for chest pain. Aisha Hove, MD  Active Child           Med Note Ruby Corporal Feb 13, 2024  7:40 AM) prn  OXYGEN  [id number]  Inhale 2 L/min into the lungs at bedtime. And as needed for SOB [provider]  Active Child  pantoprazole  (PROTONIX ) 40 MG tablet [id number]  TAKE 1 TABLET EVERY DAY Cherrie Cornwall, MD  Active Child  Polyethyl Glyc-Propyl Glyc PF (SYSTANE HYDRATION PF) 0.4-0.3 % SOLN [id number]  Apply 1 drop to eye daily as needed (dry eyes). [provider]  Active Child           Med Note Ruby Corporal Feb 13, 2024  7:45 AM) prn  rosuvastatin  (CRESTOR ) 40 MG tablet [id number]  TAKE 1 TABLET EVERY DAY  Patient taking differently: Take 40 mg by mouth at bedtime.   Cherrie Cornwall, MD  Active Child  saccharomyces boulardii (FLORASTOR) 250 MG capsule [id number]  Take 1 capsule (250 mg total) by mouth 2 (two) times daily for 7 days. Althia Atlas, MD  Active   sertraline  (ZOLOFT ) 25 MG tablet [id number]  TAKE 1 TABLET THREE TIMES DAILY Aisha Hove, MD  Active Child  Vitamin D , Ergocalciferol , 50000 units CAPS [id number]  Take 1 capsule by mouth once a week. Aisha Hove, MD  Active Child           Med Note Ruby Corporal Feb 13, 2024  7:50 AM) Friday   Med List Note Isidore Mares, [place] 05/03/22 1307): Pt has CPAP Machine that she uses with 2L of oxygen               Home Care and Equipment/Supplies: Were Home Health Services Ordered?: No Any new equipment or medical supplies ordered?: No  Functional Questionnaire: Do you need assistance with bathing/showering or dressing?: No Do you need assistance with meal preparation?: No Do you need assistance with eating?: No Do you have difficulty maintaining continence: No Do you need assistance with getting out of bed/getting out of a chair/moving?: No Do you have difficulty managing or taking your medications?: No  Follow up appointments reviewed:  PCP Follow-up appointment confirmed?: Yes Date of PCP follow-up appointment?: 02/23/24 Follow-up Provider: [facility] Follow-up appointment confirmed?: Yes Date of Specialist follow-up appointment?: 02/16/24 Follow-Up Specialty Provider:: [place] [facility] Do you understand care options if your condition(s) worsen?: Yes-patient verbalized understanding  SDOH Interventions Today    Flowsheet Row Most Recent Value  SDOH Interventions   Food Insecurity Interventions Intervention Not Indicated  Housing Interventions Intervention Not Indicated  Transportation Interventions Intervention Not Indicated  Utilities Interventions Intervention Not Indicated       Goals      Activity and Exercise Increased     Evidence-based guidance:  Review current exercise levels.  Assess patient perspective on exercise or activity level, barriers to increasing activity, motivation and readiness for change.  Recommend or set healthy exercise goal based on individual tolerance.  Encourage small steps toward making change in amount of exercise or activity.  Urge reduction of sedentary activities or screen time.  Promote group activities within the community or with family or support person.  Consider referral to rehabiliation therapist for assessment and exercise/activity plan.   Notes:      Care coordination Activities     Activities and task to complete in  order to accomplish goals.   EMOTIONAL / MENTAL HEALTH SUPPORT Continue with compliance of taking medication prescribed by Doctor Self Support options  (Continue to seek support from family and self monitor MH)      [facility] Transitions of Care (TOC) Care Plan     Problems:  Recent Hospitalization for treatment of CHF Functional/Safety concern: History of Falls  Goal:  Over the next 30 days, the patient will not experience hospital readmission  Interventions:   Heart Failure Interventions: Provided education on low sodium diet Assessed need for readable accurate scales in home Provided education about placing scale on hard, flat surface Advised patient to weigh each morning after emptying bladder Discussed importance of daily weight and advised patient to weigh and record daily Reviewed role of diuretics in prevention of fluid overload and management of heart failure; Discussed the importance of keeping all appointments with provider Screening for signs and symptoms of depression related to chronic disease state  Assessed social determinant of health barriers   Patient Self Care Activities:  Attend all scheduled provider appointments Call pharmacy for medication refills 3-7 days in advance of running out of medications Call provider office for new concerns or questions  Notify RN Care Manager of TOC call rescheduling needs Participate in Transition of Care Program/Attend TOC scheduled calls keep legs up while sitting use salt in moderation watch for swelling in feet, ankles and legs every day weigh myself daily track symptoms and what helps feel better or worse  Plan:  Telephone follow up appointment with care management team member scheduled for:  Friday May 23rd at 11:00am        Goals Addressed             This Visit's Progress    [facility] Transitions of Care (TOC) Care Plan       Problems:  Recent Hospitalization for treatment of CHF Functional/Safety concern:  History of Falls  Goal:  Over the next 30 days, the patient will not experience hospital readmission  Interventions:   Heart Failure Interventions: Provided education on low sodium diet Assessed need for readable accurate scales in home Provided education about placing scale on hard, flat surface Advised patient to weigh each morning after emptying bladder Discussed importance of daily weight and advised  patient to weigh and record daily Reviewed role of diuretics in prevention of fluid overload and management of heart failure; Discussed the importance of keeping all appointments with provider Screening for signs and symptoms of depression related to chronic disease state  Assessed social determinant of health barriers   Patient Self Care Activities:  Attend all scheduled provider appointments Call pharmacy for medication refills 3-7 days in advance of running out of medications Call provider office for new concerns or questions  Notify RN Care Manager of TOC call rescheduling needs Participate in Transition of Care Program/Attend TOC scheduled calls keep legs up while sitting use salt in moderation watch for swelling in feet, ankles and legs every day weigh myself daily track symptoms and what helps feel better or worse  Plan:  Telephone follow up appointment with care management team member scheduled for:  Friday May 23rd at 11:00am        The patient was re-admitted to the hospital due to reoccurrence of Pneumonia and for Heart Failure. She also was hypotensive and her medications have been adjusted. She does take her BP at home and she will monitor for low pressures . She saw her cardiologist today. Current weight is 182lbs. She has been feeling lightheaded and she will stand slowly and get her balance before walking. She had a significant fall in November of 2024 and she does not want to fall again. She lives with her daughter and her husband and he is legally blind. At  baseline when she is feeling well she does drive. She has chronic back pain and has decreased balance and tingling in her feet and legs.  Gareld June, BSN, RN   [facility] - [employer] [facility] RN Care Manager [phone]

## 2024-02-16 NOTE — Patient Instructions (Signed)
 Visit Information  Thank you for taking time to visit with me today. Please don't hesitate to contact me if I can be of assistance to you before our next scheduled telephone appointment.  Our next appointment is by telephone on Friday May23rd at 11:00am  Following is a copy of your care plan:   Goals Addressed             This Visit's Progress    [facility] Transitions of Care (TOC) Care Plan       Problems:  Recent Hospitalization for treatment of CHF Functional/Safety concern: History of Falls  Goal:  Over the next 30 days, the patient will not experience hospital readmission  Interventions:   Heart Failure Interventions: Provided education on low sodium diet Assessed need for readable accurate scales in home Provided education about placing scale on hard, flat surface Advised patient to weigh each morning after emptying bladder Discussed importance of daily weight and advised patient to weigh and record daily Reviewed role of diuretics in prevention of fluid overload and management of heart failure; Discussed the importance of keeping all appointments with provider Screening for signs and symptoms of depression related to chronic disease state  Assessed social determinant of health barriers   Patient Self Care Activities:  Attend all scheduled provider appointments Call pharmacy for medication refills 3-7 days in advance of running out of medications Call provider office for new concerns or questions  Notify RN Care Manager of TOC call rescheduling needs Participate in Transition of Care Program/Attend TOC scheduled calls keep legs up while sitting use salt in moderation watch for swelling in feet, ankles and legs every day weigh myself daily track symptoms and what helps feel better or worse  Plan:  Telephone follow up appointment with care management team member scheduled for:  Friday May 23rd at 11:00am        Patient verbalizes understanding of instructions and  care plan provided today and agrees to view in MyChart. Active MyChart status and patient understanding of how to access instructions and care plan via MyChart confirmed with patient.     The patient has been provided with contact information for the care management team and has been advised to call with any health related questions or concerns.   Please call the care guide team at [phone] if you need to cancel or reschedule your appointment.   Please call the Suicide and Crisis Lifeline: 988 call the [place]  National Suicide Prevention Lifeline: (905)435-2719 or TTY: (878)231-1804 TTY [phone]) to talk to a trained counselor if you are experiencing a Mental Health or Behavioral Health Crisis or need someone to talk to.  Gareld June, BSN, RN [place]  [facility] - [employer] [facility] RN Care Manager [phone]

## 2024-02-17 ENCOUNTER — Ambulatory Visit: Admitting: Cardiovascular Disease

## 2024-02-18 LAB — CULTURE, BLOOD (ROUTINE X 2)
Culture: NO GROWTH
Culture: NO GROWTH
Special Requests: ADEQUATE

## 2024-02-20 ENCOUNTER — Encounter

## 2024-02-23 ENCOUNTER — Ambulatory Visit (INDEPENDENT_AMBULATORY_CARE_PROVIDER_SITE_OTHER): Admitting: Internal Medicine

## 2024-02-23 ENCOUNTER — Encounter: Payer: Self-pay | Admitting: Internal Medicine

## 2024-02-23 VITALS — BP 130/60 | HR 55 | Ht 71.0 in | Wt 188.8 lb

## 2024-02-23 DIAGNOSIS — E782 Mixed hyperlipidemia: Secondary | ICD-10-CM | POA: Diagnosis not present

## 2024-02-23 DIAGNOSIS — I951 Orthostatic hypotension: Secondary | ICD-10-CM

## 2024-02-23 DIAGNOSIS — I10 Essential (primary) hypertension: Secondary | ICD-10-CM

## 2024-02-23 DIAGNOSIS — J189 Pneumonia, unspecified organism: Secondary | ICD-10-CM

## 2024-02-23 DIAGNOSIS — I25112 Atherosclerotic heart disease of native coronary artery with refractory angina pectoris: Secondary | ICD-10-CM | POA: Diagnosis not present

## 2024-02-23 NOTE — Progress Notes (Signed)
 Established Patient Office Visit  Subjective:  Patient ID: Sarah Riddle, female    DOB: 1941-05-03  Age: 83 y.o. MRN: [id number]  Chief Complaint  Patient presents with   Hospitalization Follow-up    Patient comes in for her hospital follow-up, she got readmitted from 02/13/2024 until 02/15/2024 with chest pain and shortness of breath.  Found to have recurring pneumonia as well as CHF.  She was treated with IV Lasix  and antibiotics and sent home.  Today she is feeling somewhat better but feels tired.  Denies chest pain or shortness of breath, no palpitations, no fevers and no chills.  Her repeat chest x-ray will be done in another 3 weeks.  She has completed her antibiotic course and advised to continue her probiotics. Her blood pressure has been fluctuating and her medications have been adjusted by the cardiologist.  However she is has started to retain fluid again, advised to take her Lasix  2-3 times per week only.    No other concerns at this time.   Past Medical History:  Diagnosis Date   A-fib [facility])    Accelerated hypertension 10/31/2022   Age related osteoporosis    Cancer (HCC)    Melanoma x 2 -- Lt Leg -1976 Rt Arm - 2014   Chronic bilateral low back pain    Chronic kidney disease    Complication of anesthesia    DVT (deep venous thrombosis) (HCC) 1960   right leg  was on BC pills   Dysrhythmia    Family history of breast cancer    Family history of colon cancer    Family history of melanoma    Family history of prostate cancer    Fibrocystic breast disease    GERD (gastroesophageal reflux disease)    Headache    migraine   Heart murmur    not heard now.   History of kidney stones    Hyperlipidemia    Hypertension    IBS (irritable bowel syndrome)    Lumbago    Mitral valve prolapse    Neuropathy    Obstructive sleep apnea    Osteoarthritis of shoulder    PONV (postoperative nausea and vomiting)     Past Surgical History:  Procedure Laterality Date    ABDOMINAL HYSTERECTOMY     BACK SURGERY     BREAST BIOPSY Left 04/20/2023   us  bx/ heart clip/ path pending   BREAST BIOPSY Left 04/20/2023   US  LT BREAST BX W LOC DEV 1ST LESION IMG BX SPEC US  GUIDE 04/20/2023 [facility]   BREAST CYST ASPIRATION Left 2001   Negative   CARDIAC ELECTROPHYSIOLOGY MAPPING AND ABLATION     EYE SURGERY     HUMERUS IM NAIL Right 05/05/2022   Procedure: INTRAMEDULLARY (IM) NAIL HUMERAL;  Surgeon: Laneta Pintos, MD;  Location: [facility];  Service: Orthopedics;  Laterality: Right;   HYSTEROTOMY     L4-L5  2020   titaum rebok cage   LEFT HEART CATH AND CORONARY ANGIOGRAPHY N/A 11/01/2022   Procedure: LEFT HEART CATH AND CORONARY ANGIOGRAPHY and possible PCI and stent;  Surgeon: Cherrie Cornwall, MD;  Location: [facility] INVASIVE CV LAB;  Service: Cardiovascular;  Laterality: N/A;   TOTAL HIP ARTHROPLASTY Left     Social History   Socioeconomic History   Marital status: Married    Spouse name: Not on file   Number of children: Not on file   Years of education: Not on file   Highest education level: Not  on file  Occupational History   Not on file  Tobacco Use   Smoking status: Never   Smokeless tobacco: Never  Vaping Use   Vaping status: Never Used  Substance and Sexual Activity   Alcohol  use: No   Drug use: No   Sexual activity: Not Currently  Other Topics Concern   Not on file  Social History Narrative   Not on file   Social Drivers of Health   Financial Resource Strain: Low Risk  (09/23/2023)   Overall Financial Resource Strain (CARDIA)    Difficulty of Paying Living Expenses: Not hard at all  Food Insecurity: No Food Insecurity (02/16/2024)   Hunger Vital Sign    Worried About Running Out of Food in the Last Year: Never true    Ran Out of Food in the Last Year: Never true  Transportation Needs: No Transportation Needs (02/16/2024)   PRAPARE - Administrator, Civil Service (Medical): No    Lack of Transportation (Non-Medical): No   Physical Activity: Inactive (09/23/2023)   Exercise Vital Sign    Days of Exercise per Week: 0 days    Minutes of Exercise per Session: 0 min  Stress: No Stress Concern Present (09/23/2023)   [employer] of Occupational Health - Occupational Stress Questionnaire    Feeling of Stress : Only a little  Social Connections: Moderately Isolated (02/13/2024)   Social Connection and Isolation Panel [NHANES]    Frequency of Communication with Friends and Family: More than three times a week    Frequency of Social Gatherings with Friends and Family: Twice a week    Attends Religious Services: Never    Database administrator or Organizations: No    Attends Banker Meetings: Never    Marital Status: Married  Catering manager Violence: Not At Risk (02/16/2024)   Humiliation, Afraid, Rape, and Kick questionnaire    Fear of Current or Ex-Partner: No    Emotionally Abused: No    Physically Abused: No    Sexually Abused: No    Family History  Problem Relation Age of Onset   Colon cancer Mother 39   Prostate cancer Father        dx 51s   Cancer Sister    Acute myelogenous leukemia Sister        dx 9s   Prostate cancer Brother    Melanoma Brother    Prostate cancer Brother    Melanoma Brother    HIV/AIDS Brother 4   Tuberculosis Paternal Aunt    Melanoma Other    Breast cancer Other    Melanoma Niece     Allergies  Allergen Reactions   Iodinated Contrast Media Shortness Of Breath and Other (See Comments)    Can be premedicated  Have been able to tolerate if given premeds (benadryl , prednisone )   Ibuprofen Other (See Comments)    "stomach problems"    Sulfa Antibiotics Other (See Comments)    "Stomach problems"  "Stomach problems"  "Stomach problems"  "Stomach problems"   Doxycycline Diarrhea and Nausea And Vomiting   Tetracyclines & Related Nausea Only and Nausea And Vomiting    Outpatient Medications Prior to Visit  Medication Sig   acetaminophen   (TYLENOL ) 500 MG tablet Take 500 mg by mouth every 6 (six) hours as needed for moderate pain (pain score 4-6) or mild pain (pain score 1-3). Once a day   alendronate  (FOSAMAX ) 70 MG tablet Take 1 tablet (70 mg total) by mouth every  Thursday. Take with a full glass of water on an empty stomach. (Patient taking differently: Take 70 mg by mouth every Tuesday. Take with a full glass of water on an empty stomach.)   allopurinol  (ZYLOPRIM ) 100 MG tablet Take 1 tablet (100 mg total) by mouth daily.   apixaban  (ELIQUIS ) 5 MG TABS tablet Take 1 tablet (5 mg total) by mouth 2 (two) times daily.   azelastine  (ASTELIN ) 0.1 % nasal spray Place 2 sprays into both nostrils at bedtime. Use in each nostril as directed (Patient taking differently: Place 2 sprays into both nostrils daily as needed for allergies or rhinitis. Use in each nostril as directed)   cyanocobalamin  (VITAMIN B12) 1000 MCG tablet Take 1,000 mcg by mouth daily. Monday to Friday   diclofenac  Sodium (VOLTAREN ) 1 % GEL Apply 2 g topically 3 (three) times daily.   EPINEPHrine  0.3 mg/0.3 mL IJ SOAJ injection Inject 0.3 mg into the muscle as needed for anaphylaxis.   fluticasone  (FLONASE ) 50 MCG/ACT nasal spray Place 2 sprays into both nostrils daily as needed for allergies or rhinitis.   gabapentin  (NEURONTIN ) 800 MG tablet TAKE 1 TABLET FOUR TIMES DAILY   levocetirizine (XYZAL ) 5 MG tablet TAKE 1 TABLET EVERY EVENING   losartan  (COZAAR ) 25 MG tablet Take 1 tablet (25 mg total) by mouth daily.   meclizine  (ANTIVERT ) 12.5 MG tablet Take 1 tablet (12.5 mg total) by mouth 3 (three) times daily as needed for dizziness.   metoprolol  succinate (TOPROL -XL) 25 MG 24 hr tablet Take 1 tablet (25 mg total) by mouth daily.   nitroGLYCERIN  (NITROSTAT ) 0.4 MG SL tablet Place 1 tablet (0.4 mg total) under the tongue every 5 (five) minutes as needed for chest pain.   OXYGEN  Inhale 2 L/min into the lungs at bedtime. And as needed for SOB   pantoprazole  (PROTONIX ) 40 MG  tablet TAKE 1 TABLET EVERY DAY   Polyethyl Glyc-Propyl Glyc PF (SYSTANE HYDRATION PF) 0.4-0.3 % SOLN Apply 1 drop to eye daily as needed (dry eyes).   rosuvastatin  (CRESTOR ) 40 MG tablet TAKE 1 TABLET EVERY DAY (Patient taking differently: Take 40 mg by mouth at bedtime.)   saccharomyces boulardii (FLORASTOR) 250 MG capsule Take 1 capsule (250 mg total) by mouth 2 (two) times daily for 7 days.   sertraline  (ZOLOFT ) 25 MG tablet TAKE 1 TABLET THREE TIMES DAILY   Vitamin D , Ergocalciferol , 50000 units CAPS Take 1 capsule by mouth once a week.   furosemide  (LASIX ) 20 MG tablet Take 1 tablet (20 mg total) by mouth daily. (Patient not taking: Reported on 02/23/2024)   No facility-administered medications prior to visit.    Review of Systems  Constitutional:  Positive for weight loss. Negative for chills and fever.  HENT: Negative.  Negative for hearing loss and sore throat.   Eyes: Negative.   Respiratory: Negative.  Negative for cough, shortness of breath and stridor.   Cardiovascular: Negative.  Negative for chest pain, palpitations and leg swelling.  Gastrointestinal: Negative.  Negative for abdominal pain, constipation, diarrhea, heartburn, nausea and vomiting.  Genitourinary: Negative.  Negative for dysuria and flank pain.  Musculoskeletal: Negative.  Negative for joint pain and myalgias.  Skin: Negative.   Neurological: Negative.  Negative for dizziness, tingling, tremors, sensory change and headaches.  Endo/Heme/Allergies: Negative.   Psychiatric/Behavioral: Negative.  Negative for depression and suicidal ideas. The patient is not nervous/anxious.        Objective:   BP 130/60   Pulse (!) 55   Ht 5'  11" (1.803 m)   Wt 188 lb 12.8 oz (85.6 kg)   SpO2 97%   BMI 26.33 kg/m   Vitals:   02/23/24 1038  BP: 130/60  Pulse: (!) 55  Height: 5\' 11"  (1.803 m)  Weight: 188 lb 12.8 oz (85.6 kg)  SpO2: 97%  BMI (Calculated): 26.34    Physical Exam Vitals and nursing note reviewed.   Constitutional:      Appearance: Normal appearance.  HENT:     Head: Normocephalic and atraumatic.     Nose: Nose normal.     Mouth/Throat:     Mouth: Mucous membranes are moist.     Pharynx: Oropharynx is clear.  Eyes:     Conjunctiva/sclera: Conjunctivae normal.     Pupils: Pupils are equal, round, and reactive to light.  Cardiovascular:     Rate and Rhythm: Normal rate and regular rhythm.     Pulses: Normal pulses.     Heart sounds: Normal heart sounds. No murmur heard. Pulmonary:     Effort: Pulmonary effort is normal.     Breath sounds: Normal breath sounds. No wheezing.  Abdominal:     General: Bowel sounds are normal.     Palpations: Abdomen is soft.     Tenderness: There is no abdominal tenderness. There is no right CVA tenderness or left CVA tenderness.  Musculoskeletal:        General: Normal range of motion.     Cervical back: Normal range of motion.     Right lower leg: No edema.     Left lower leg: No edema.  Skin:    General: Skin is warm and dry.  Neurological:     General: No focal deficit present.     Mental Status: She is alert and oriented to person, place, and time.  Psychiatric:        Mood and Affect: Mood normal.        Behavior: Behavior normal.      No results found for any visits on 02/23/24.  Recent Results (from the past 2160 hours)  Protime-INR     Status: Abnormal   Collection Time: 12/22/23 10:48 AM  Result Value Ref Range   INR 1.1 0.9 - 1.2    Comment: Reference interval is for non-anticoagulated patients. Suggested INR therapeutic range for Vitamin K antagonist therapy:    Standard Dose (moderate intensity                   therapeutic range):       2.0 - 3.0    Higher intensity therapeutic range       2.5 - 3.5    Prothrombin  Time 12.2 (H) 9.1 - 12.0 sec  Hemoglobin A1c     Status: None   Collection Time: 12/22/23 10:49 AM  Result Value Ref Range   Hgb A1c MFr Bld 5.6 4.8 - 5.6 %    Comment:          Prediabetes: 5.7 -  6.4          Diabetes: >6.4          Glycemic control for adults with diabetes: <7.0    Est. average glucose Bld gHb Est-mCnc 114 mg/dL  TSH     Status: None   Collection Time: 12/22/23 10:49 AM  Result Value Ref Range   TSH 0.827 0.450 - 4.500 uIU/mL  CMP14+EGFR     Status: Abnormal   Collection Time: 12/22/23 10:49 AM  Result Value Ref  Range   Glucose 92 70 - 99 mg/dL   BUN 15 8 - 27 mg/dL   Creatinine, Ser 1.61 0.57 - 1.00 mg/dL   eGFR 68 >09 UE/AVW/0.98   BUN/Creatinine Ratio 18 12 - 28   Sodium 144 134 - 144 mmol/L   Potassium 4.0 3.5 - 5.2 mmol/L   Chloride 106 96 - 106 mmol/L   CO2 28 20 - 29 mmol/L   Calcium  9.5 8.7 - 10.3 mg/dL   Total Protein 5.8 (L) 6.0 - 8.5 g/dL   Albumin 4.0 3.7 - 4.7 g/dL   Globulin, Total 1.8 1.5 - 4.5 g/dL   Bilirubin Total 0.5 0.0 - 1.2 mg/dL   Alkaline Phosphatase 66 44 - 121 IU/L   AST 15 0 - 40 IU/L   ALT 8 0 - 32 IU/L  Lipid panel     Status: None   Collection Time: 12/22/23 10:49 AM  Result Value Ref Range   Cholesterol, Total 131 100 - 199 mg/dL   Triglycerides 119 0 - 149 mg/dL   HDL 58 >14 mg/dL   VLDL Cholesterol Cal 20 5 - 40 mg/dL   LDL Chol Calc (NIH) 53 0 - 99 mg/dL   Chol/HDL Ratio 2.3 0.0 - 4.4 ratio    Comment:                                   T. Chol/HDL Ratio                                             Men  Women                               1/2 Avg.Risk  3.4    3.3                                   Avg.Risk  5.0    4.4                                2X Avg.Risk  9.6    7.1                                3X Avg.Risk 23.4   11.0   Culture, blood (routine x 2)     Status: Abnormal   Collection Time: 01/25/24  1:10 AM   Specimen: BLOOD  Result Value Ref Range   Specimen Description      BLOOD LEFT ANTECUBITAL Performed at [facility], [address], [place], [place] [address]    Special Requests      BOTTLES DRAWN AEROBIC AND ANAEROBIC Blood Culture results may not be optimal due to an inadequate volume  of blood received in culture bottles Performed at [facility], [address] [place], [place] [address]    Culture  Setup Time      GRAM POSITIVE COCCI IN BOTH AEROBIC AND ANAEROBIC BOTTLES CRITICAL VALUE NOTED.  VALUE IS CONSISTENT WITH PREVIOUSLY REPORTED AND CALLED VALUE. Performed at [facility], [address] [place], [place] [address]    Culture (A)     STREPTOCOCCUS PNEUMONIAE SUSCEPTIBILITIES PERFORMED ON PREVIOUS CULTURE WITHIN THE LAST 5 DAYS. Performed at [facility], [address], [place], [place] [address]    Report Status 01/28/2024 FINAL   CBC with Differential     Status: Abnormal   Collection Time: 01/25/24  1:12 AM  Result Value Ref Range   WBC 10.9 (H) 4.0 - 10.5 K/uL   RBC 3.90 3.87 - 5.11 MIL/uL   Hemoglobin 11.3 (L) 12.0 - 15.0 g/dL   HCT 62.9 (L) 52.8 - 41.3 %   MCV 91.0 80.0 - 100.0 fL   MCH 29.0 26.0 - 34.0 pg   MCHC 31.8 30.0 - 36.0 g/dL   RDW 24.4 01.0 - 27.2 %   Platelets 166 150 - 400 K/uL   nRBC 0.0 0.0 - 0.2 %   Neutrophils Relative % 77 %   Neutro Abs 8.5 (H) 1.7 - 7.7 K/uL   Lymphocytes Relative 18 %   Lymphs Abs 1.9 0.7 - 4.0 K/uL   Monocytes Relative 4 %   Monocytes Absolute 0.4 0.1 - 1.0 K/uL   Eosinophils Relative 1 %   Eosinophils Absolute 0.1 0.0 - 0.5 K/uL   Basophils Relative 0 %   Basophils Absolute 0.0 0.0 - 0.1 K/uL   Immature Granulocytes 0 %   Abs Immature Granulocytes 0.04 0.00 - 0.07 K/uL    Comment: Performed at [facility], [address] [place], [place] [address]  Comprehensive metabolic panel     Status: Abnormal   Collection Time: 01/25/24  1:12 AM  Result Value Ref Range   Sodium 136 135 - 145 mmol/L   Potassium 3.6 3.5 - 5.1 mmol/L   Chloride 101 98 - 111 mmol/L   CO2 27 22 - 32 mmol/L   Glucose, Bld 169 (H) 70 - 99 mg/dL    Comment: Glucose reference range applies only to samples taken after fasting for at least 8 hours.   BUN 19 8 - 23 mg/dL   Creatinine, Ser  4.03 0.44 - 1.00 mg/dL   Calcium  9.2 8.9 - 10.3 mg/dL   Total Protein 6.3 (L) 6.5 - 8.1 g/dL   Albumin 3.7 3.5 - 5.0 g/dL   AST 21 15 - 41 U/L   ALT 11 0 - 44 U/L   Alkaline Phosphatase 60 38 - 126 U/L   Total Bilirubin 0.6 0.0 - 1.2 mg/dL   GFR, Estimated >47 >42 mL/min    Comment: (NOTE) Calculated using the CKD-EPI Creatinine Equation (2021)    Anion gap 8 5 - 15    Comment: Performed at [facility], [address] [place], [place] [address]  Brain natriuretic peptide     Status: Abnormal   Collection Time: 01/25/24  1:12 AM  Result Value Ref Range   B Natriuretic Peptide 522.6 (H) 0.0 - 100.0 pg/mL    Comment: Performed at [facility], [address] [place], [place] [address]  Troponin I (High Sensitivity)     Status: Abnormal   Collection Time: 01/25/24  1:12 AM  Result Value Ref Range   Troponin I (High Sensitivity) 27 (H) <18 ng/L    Comment: (NOTE) Elevated high sensitivity troponin I (hsTnI) values and significant  changes across serial measurements may suggest ACS but many other  chronic and acute conditions are known to elevate hsTnI results.  Refer to the "Links" section for chest pain algorithms and additional  guidance. Performed at [facility], [address] [place], [place] [address]   Lactic acid, plasma     Status: None   Collection Time: 01/25/24  1:13 AM  Result Value Ref Range   Lactic Acid, Venous 1.6 0.5 - 1.9 mmol/L    Comment: Performed at [facility], [address] [place], [place] [address]  Culture, blood (routine x 2)     Status: Abnormal   Collection Time: 01/25/24  1:15 AM   Specimen: BLOOD  Result Value Ref Range   Specimen Description      BLOOD RIGHT ANTECUBITAL Performed at [facility], [address] [place], [place] [address]    Special Requests      BOTTLES DRAWN AEROBIC AND ANAEROBIC Blood Culture results may not be optimal due to an inadequate volume of blood received in  culture bottles Performed at [facility], [address] [place], [place] [address]    Culture  Setup Time      GRAM POSITIVE COCCI IN BOTH AEROBIC AND ANAEROBIC BOTTLES Organism ID to follow CRITICAL RESULT CALLED TO, READ BACK BY AND VERIFIED WITH: TREY GREENWOOD 1616 01/25/24 MU Performed at [facility], [address] [place], [place] [address]    Culture STREPTOCOCCUS PNEUMONIAE (A)    Report Status 01/28/2024 FINAL    Organism ID, Bacteria STREPTOCOCCUS PNEUMONIAE       Susceptibility   Streptococcus pneumoniae - MIC*    ERYTHROMYCIN <=0.12 SENSITIVE Sensitive     LEVOFLOXACIN 1 SENSITIVE Sensitive     VANCOMYCIN  0.5 SENSITIVE Sensitive     PENICILLIN (meningitis) <=0.06 SENSITIVE Sensitive     PENO - penicillin <=0.06      PENICILLIN (non-meningitis) <=0.06 SENSITIVE Sensitive     PENICILLIN (oral) <=0.06 SENSITIVE Sensitive     CEFTRIAXONE  (non-meningitis) <=0.12 SENSITIVE Sensitive     CEFTRIAXONE  (meningitis) <=0.12 SENSITIVE Sensitive     * STREPTOCOCCUS PNEUMONIAE  Blood Culture ID Panel (Reflexed)     Status: Abnormal   Collection Time: 01/25/24  1:15 AM  Result Value Ref Range   Enterococcus faecalis NOT DETECTED NOT DETECTED   Enterococcus Faecium NOT DETECTED NOT DETECTED   Listeria monocytogenes NOT DETECTED NOT DETECTED   Staphylococcus species NOT DETECTED NOT DETECTED   Staphylococcus aureus (BCID) NOT DETECTED NOT DETECTED   Staphylococcus epidermidis NOT DETECTED NOT DETECTED   Staphylococcus lugdunensis NOT DETECTED NOT DETECTED   Streptococcus species DETECTED (A) NOT DETECTED    Comment: CRITICAL RESULT CALLED TO, READ BACK BY AND VERIFIED WITH: TREY GREENWOOD 1616 01/25/24 MU    Streptococcus agalactiae NOT DETECTED NOT DETECTED   Streptococcus pneumoniae DETECTED (A) NOT DETECTED    Comment: CRITICAL RESULT CALLED TO, READ BACK BY AND VERIFIED WITH: TREY GREENWOOD 1616 01/25/24 MU    Streptococcus pyogenes NOT DETECTED NOT  DETECTED   A.calcoaceticus-baumannii NOT DETECTED NOT DETECTED   Bacteroides fragilis NOT DETECTED NOT DETECTED   Enterobacterales NOT DETECTED NOT DETECTED   Enterobacter cloacae complex NOT DETECTED NOT DETECTED   Escherichia coli NOT DETECTED NOT DETECTED   Klebsiella aerogenes NOT DETECTED NOT DETECTED   Klebsiella oxytoca NOT DETECTED NOT DETECTED   Klebsiella pneumoniae NOT DETECTED NOT DETECTED   Proteus species NOT DETECTED NOT DETECTED   Salmonella species NOT DETECTED NOT DETECTED   Serratia marcescens NOT DETECTED NOT DETECTED   Haemophilus influenzae NOT DETECTED NOT DETECTED   Neisseria meningitidis NOT DETECTED NOT DETECTED   Pseudomonas aeruginosa NOT DETECTED NOT DETECTED  Stenotrophomonas maltophilia NOT DETECTED NOT DETECTED   Candida albicans NOT DETECTED NOT DETECTED   Candida auris NOT DETECTED NOT DETECTED   Candida glabrata NOT DETECTED NOT DETECTED   Candida krusei NOT DETECTED NOT DETECTED   Candida parapsilosis NOT DETECTED NOT DETECTED   Candida tropicalis NOT DETECTED NOT DETECTED   Cryptococcus neoformans/gattii NOT DETECTED NOT DETECTED    Comment: Performed at [facility], [address] [place], [place] [address]  Urinalysis, w/ Reflex to Culture (Infection Suspected) -Urine, Catheterized     Status: Abnormal   Collection Time: 01/25/24  1:24 AM  Result Value Ref Range   Specimen Source URINE, CATHETERIZED    Color, Urine YELLOW (A) YELLOW   APPearance CLEAR (A) CLEAR   Specific Gravity, Urine 1.017 1.005 - 1.030   pH 6.0 5.0 - 8.0   Glucose, UA NEGATIVE NEGATIVE mg/dL   Hgb urine dipstick NEGATIVE NEGATIVE   Bilirubin Urine NEGATIVE NEGATIVE   Ketones, ur NEGATIVE NEGATIVE mg/dL   Protein, ur 30 (A) NEGATIVE mg/dL   Nitrite NEGATIVE NEGATIVE   Leukocytes,Ua NEGATIVE NEGATIVE   RBC / HPF 0-5 0 - 5 RBC/hpf   WBC, UA 0-5 0 - 5 WBC/hpf    Comment:        Reflex urine culture not performed if WBC <=10, OR if Squamous  epithelial cells >5. If Squamous epithelial cells >5 suggest recollection.    Bacteria, UA NONE SEEN NONE SEEN   Squamous Epithelial / HPF 0-5 0 - 5 /HPF   Mucus PRESENT     Comment: Performed at [facility], [address] [place], [place] [address]  Lactic acid, plasma     Status: None   Collection Time: 01/25/24  3:17 AM  Result Value Ref Range   Lactic Acid, Venous 1.3 0.5 - 1.9 mmol/L    Comment: Performed at [facility], [address] [place], [place] [address]  Troponin I (High Sensitivity)     Status: Abnormal   Collection Time: 01/25/24  3:17 AM  Result Value Ref Range   Troponin I (High Sensitivity) 433 (HH) <18 ng/L    Comment: CRITICAL RESULT CALLED TO, READ BACK BY AND VERIFIED WITH  KIMBERLEY EVANS AT 9147 01/25/24 JG (NOTE) Elevated high sensitivity troponin I (hsTnI) values and significant  changes across serial measurements may suggest ACS but many other  chronic and acute conditions are known to elevate hsTnI results.  Refer to the "Links" section for chest pain algorithms and additional  guidance. Performed at [facility], [address] [place], [place] [address]   Resp panel by RT-PCR (RSV, Flu A&B, Covid) Anterior Nasal Swab     Status: None   Collection Time: 01/25/24  3:48 AM   Specimen: Anterior Nasal Swab  Result Value Ref Range   SARS Coronavirus 2 by RT PCR NEGATIVE NEGATIVE    Comment: (NOTE) SARS-CoV-2 target nucleic acids are NOT DETECTED.  The SARS-CoV-2 RNA is generally detectable in upper respiratory specimens during the acute phase of infection. The lowest concentration of SARS-CoV-2 viral copies this assay can detect is 138 copies/mL. A negative result does not preclude SARS-Cov-2 infection and should not be used as the sole basis for treatment or other patient management decisions. A negative result may occur with  improper specimen collection/handling, submission of specimen other than nasopharyngeal  swab, presence of viral mutation(s) within the areas targeted by this assay, and inadequate number of viral copies(<138 copies/mL). A negative result must be combined  with clinical observations, patient history, and epidemiological information. The expected result is Negative.  Fact Sheet for Patients:  BloggerCourse.com  Fact Sheet for Healthcare Providers:  SeriousBroker.it  This test is no t yet approved or cleared by the [place]  FDA and  has been authorized for detection and/or diagnosis of SARS-CoV-2 by FDA under an Emergency Use Authorization (EUA). This EUA will remain  in effect (meaning this test can be used) for the duration of the COVID-19 declaration under Section 564(b)(1) of the Act, 21 U.S.C.section 360bbb-3(b)(1), unless the authorization is terminated  or revoked sooner.       Influenza A by PCR NEGATIVE NEGATIVE   Influenza B by PCR NEGATIVE NEGATIVE    Comment: (NOTE) The Xpert Xpress SARS-CoV-2/FLU/RSV plus assay is intended as an aid in the diagnosis of influenza from Nasopharyngeal swab specimens and should not be used as a sole basis for treatment. Nasal washings and aspirates are unacceptable for Xpert Xpress SARS-CoV-2/FLU/RSV testing.  Fact Sheet for Patients: BloggerCourse.com  Fact Sheet for Healthcare Providers: SeriousBroker.it  This test is not yet approved or cleared by the [place]  FDA and has been authorized for detection and/or diagnosis of SARS-CoV-2 by FDA under an Emergency Use Authorization (EUA). This EUA will remain in effect (meaning this test can be used) for the duration of the COVID-19 declaration under Section 564(b)(1) of the Act, 21 U.S.C. section 360bbb-3(b)(1), unless the authorization is terminated or revoked.     Resp Syncytial Virus by PCR NEGATIVE NEGATIVE    Comment: (NOTE) Fact Sheet for  Patients: BloggerCourse.com  Fact Sheet for Healthcare Providers: SeriousBroker.it  This test is not yet approved or cleared by the [place]  FDA and has been authorized for detection and/or diagnosis of SARS-CoV-2 by FDA under an Emergency Use Authorization (EUA). This EUA will remain in effect (meaning this test can be used) for the duration of the COVID-19 declaration under Section 564(b)(1) of the Act, 21 U.S.C. section 360bbb-3(b)(1), unless the authorization is terminated or revoked.  Performed at [facility], [address] [place], [place] [address]   APTT     Status: None   Collection Time: 01/25/24  4:40 AM  Result Value Ref Range   aPTT 35 24 - 36 seconds    Comment: Performed at [facility], [address] [place], [place] [address]  Protime-INR     Status: Abnormal   Collection Time: 01/25/24  4:40 AM  Result Value Ref Range   Prothrombin  Time 16.2 (H) 11.4 - 15.2 seconds   INR 1.3 (H) 0.8 - 1.2    Comment: (NOTE) INR goal varies based on device and disease states. Performed at [facility], [address] [place], [place] [address]   Heparin  level (unfractionated)     Status: Abnormal   Collection Time: 01/25/24  4:40 AM  Result Value Ref Range   Heparin  Unfractionated >1.10 (H) 0.30 - 0.70 IU/mL    Comment: (NOTE) The clinical reportable range upper limit is being lowered to >1.10 to align with the FDA approved guidance for the current laboratory assay.  If heparin  results are below expected values, and patient dosage has  been confirmed, suggest follow up testing of antithrombin III levels. Performed at [facility], [address] [place], [place] [address]   CBG monitoring, ED     Status: Abnormal   Collection Time: 01/25/24  7:27 AM  Result Value Ref Range   Glucose-Capillary 102 (H) 70 - 99 mg/dL  Comment: Glucose reference range applies only to  samples taken after fasting for at least 8 hours.  CBG monitoring, ED     Status: Abnormal   Collection Time: 01/25/24 11:56 AM  Result Value Ref Range   Glucose-Capillary 108 (H) 70 - 99 mg/dL    Comment: Glucose reference range applies only to samples taken after fasting for at least 8 hours.  APTT     Status: Abnormal   Collection Time: 01/25/24 12:49 PM  Result Value Ref Range   aPTT >200 (HH) 24 - 36 seconds    Comment:        IF BASELINE aPTT IS ELEVATED, SUGGEST PATIENT RISK ASSESSMENT BE USED TO DETERMINE APPROPRIATE ANTICOAGULANT THERAPY. REPEATED TO VERIFY CRITICAL RESULT CALLED TO, READ BACK BY AND VERIFIED WITH: Tiburcio Fly, RN [phone] 01/25/24 GM Performed at [facility], [address] [place], [place] [address]   CBG monitoring, ED     Status: None   Collection Time: 01/25/24  4:24 PM  Result Value Ref Range   Glucose-Capillary 96 70 - 99 mg/dL    Comment: Glucose reference range applies only to samples taken after fasting for at least 8 hours.  CBG monitoring, ED     Status: None   Collection Time: 01/25/24  9:43 PM  Result Value Ref Range   Glucose-Capillary 77 70 - 99 mg/dL    Comment: Glucose reference range applies only to samples taken after fasting for at least 8 hours.  ECHOCARDIOGRAM COMPLETE     Status: None   Collection Time: 01/25/24 10:26 PM  Result Value Ref Range   Weight 3,326.3 oz   Height 71 in   BP 115/50 mmHg   S' Lateral 3.50 cm   Area-P 1/2 5.46 cm2   Est EF 60 - 65%   APTT     Status: Abnormal   Collection Time: 01/25/24 11:44 PM  Result Value Ref Range   aPTT 180 (HH) 24 - 36 seconds    Comment:        IF BASELINE aPTT IS ELEVATED, SUGGEST PATIENT RISK ASSESSMENT BE USED TO DETERMINE APPROPRIATE ANTICOAGULANT THERAPY. REPEATED TO VERIFY CRITICAL RESULT CALLED TO, READ BACK BY AND VERIFIED WITH:  KAITLYN OBERSKI AT 0119 01/26/24 JG Performed at [facility], [address] [place], [place] [address]   Basic  metabolic panel with GFR     Status: Abnormal   Collection Time: 01/26/24  4:14 AM  Result Value Ref Range   Sodium 140 135 - 145 mmol/L   Potassium 4.0 3.5 - 5.1 mmol/L   Chloride 104 98 - 111 mmol/L   CO2 25 22 - 32 mmol/L   Glucose, Bld 89 70 - 99 mg/dL    Comment: Glucose reference range applies only to samples taken after fasting for at least 8 hours.   BUN 28 (H) 8 - 23 mg/dL   Creatinine, Ser 6.21 (H) 0.44 - 1.00 mg/dL   Calcium  8.3 (L) 8.9 - 10.3 mg/dL   GFR, Estimated 53 (L) >60 mL/min    Comment: (NOTE) Calculated using the CKD-EPI Creatinine Equation (2021)    Anion gap 11 5 - 15    Comment: Performed at [facility], [address] [place], [place] [address]  CBC     Status: Abnormal   Collection Time: 01/26/24  4:14 AM  Result Value Ref Range   WBC 18.4 (H) 4.0 - 10.5 K/uL   RBC 3.43 (L) 3.87 - 5.11 MIL/uL  Hemoglobin 10.0 (L) 12.0 - 15.0 g/dL   HCT 16.1 (L) 09.6 - 04.5 %   MCV 95.3 80.0 - 100.0 fL   MCH 29.2 26.0 - 34.0 pg   MCHC 30.6 30.0 - 36.0 g/dL   RDW 40.9 81.1 - 91.4 %   Platelets 154 150 - 400 K/uL   nRBC 0.0 0.0 - 0.2 %    Comment: Performed at [facility], [address] [place], [place] [address]  CBG monitoring, ED     Status: None   Collection Time: 01/26/24  7:35 AM  Result Value Ref Range   Glucose-Capillary 79 70 - 99 mg/dL    Comment: Glucose reference range applies only to samples taken after fasting for at least 8 hours.  CBG monitoring, ED     Status: None   Collection Time: 01/26/24 11:38 AM  Result Value Ref Range   Glucose-Capillary 92 70 - 99 mg/dL    Comment: Glucose reference range applies only to samples taken after fasting for at least 8 hours.  Heparin  level (unfractionated)     Status: Abnormal   Collection Time: 01/26/24  1:29 PM  Result Value Ref Range   Heparin  Unfractionated 1.01 (H) 0.30 - 0.70 IU/mL    Comment: (NOTE) The clinical reportable range upper limit is being lowered to >1.10 to align  with the FDA approved guidance for the current laboratory assay.  If heparin  results are below expected values, and patient dosage has  been confirmed, suggest follow up testing of antithrombin III levels. Performed at [facility], [address] [place], [place] [address]   APTT     Status: Abnormal   Collection Time: 01/26/24  1:29 PM  Result Value Ref Range   aPTT 44 (H) 24 - 36 seconds    Comment:        IF BASELINE aPTT IS ELEVATED, SUGGEST PATIENT RISK ASSESSMENT BE USED TO DETERMINE APPROPRIATE ANTICOAGULANT THERAPY. Performed at [facility], [address] [place], [place] [address]   Glucose, capillary     Status: None   Collection Time: 01/26/24  5:27 PM  Result Value Ref Range   Glucose-Capillary 74 70 - 99 mg/dL    Comment: Glucose reference range applies only to samples taken after fasting for at least 8 hours.  Glucose, capillary     Status: None   Collection Time: 01/26/24  9:05 PM  Result Value Ref Range   Glucose-Capillary 73 70 - 99 mg/dL    Comment: Glucose reference range applies only to samples taken after fasting for at least 8 hours.  APTT     Status: Abnormal   Collection Time: 01/26/24 11:21 PM  Result Value Ref Range   aPTT 83 (H) 24 - 36 seconds    Comment:        IF BASELINE aPTT IS ELEVATED, SUGGEST PATIENT RISK ASSESSMENT BE USED TO DETERMINE APPROPRIATE ANTICOAGULANT THERAPY. Performed at [facility], [address] [place], [place] [address]   Basic metabolic panel with GFR     Status: Abnormal   Collection Time: 01/27/24  6:18 AM  Result Value Ref Range   Sodium 139 135 - 145 mmol/L   Potassium 3.7 3.5 - 5.1 mmol/L   Chloride 108 98 - 111 mmol/L   CO2 25 22 - 32 mmol/L   Glucose, Bld 89 70 - 99 mg/dL    Comment: Glucose reference range applies only to samples taken after fasting for at least 8 hours.  BUN 23 8 - 23 mg/dL   Creatinine, Ser 4.09 0.44 - 1.00 mg/dL   Calcium  8.4 (L) 8.9 - 10.3 mg/dL    GFR, Estimated >81 >19 mL/min    Comment: (NOTE) Calculated using the CKD-EPI Creatinine Equation (2021)    Anion gap 6 5 - 15    Comment: Performed at [facility], [address] [place], [place] [address]  CBC     Status: Abnormal   Collection Time: 01/27/24  6:18 AM  Result Value Ref Range   WBC 14.4 (H) 4.0 - 10.5 K/uL   RBC 3.26 (L) 3.87 - 5.11 MIL/uL   Hemoglobin 9.5 (L) 12.0 - 15.0 g/dL   HCT 95.6 (L) 21.3 - 08.6 %   MCV 92.0 80.0 - 100.0 fL   MCH 29.1 26.0 - 34.0 pg   MCHC 31.7 30.0 - 36.0 g/dL   RDW 57.8 46.9 - 62.9 %   Platelets 126 (L) 150 - 400 K/uL   nRBC 0.0 0.0 - 0.2 %    Comment: Performed at [facility], [address] [place], [place] [address]  APTT     Status: Abnormal   Collection Time: 01/27/24  6:18 AM  Result Value Ref Range   aPTT 73 (H) 24 - 36 seconds    Comment:        IF BASELINE aPTT IS ELEVATED, SUGGEST PATIENT RISK ASSESSMENT BE USED TO DETERMINE APPROPRIATE ANTICOAGULANT THERAPY. Performed at [facility], [address] [place], [place] [address]   Heparin  level (unfractionated)     Status: None   Collection Time: 01/27/24  6:18 AM  Result Value Ref Range   Heparin  Unfractionated 0.48 0.30 - 0.70 IU/mL    Comment: (NOTE) The clinical reportable range upper limit is being lowered to >1.10 to align with the FDA approved guidance for the current laboratory assay.  If heparin  results are below expected values, and patient dosage has  been confirmed, suggest follow up testing of antithrombin III levels. Performed at [facility], [address] [place], [place] [address]   Glucose, capillary     Status: None   Collection Time: 01/27/24  8:29 AM  Result Value Ref Range   Glucose-Capillary 82 70 - 99 mg/dL    Comment: Glucose reference range applies only to samples taken after fasting for at least 8 hours.  CBC with Diff     Status: Abnormal   Collection Time: 02/03/24 11:36 AM  Result  Value Ref Range   WBC 8.6 3.4 - 10.8 x10E3/uL   RBC 3.40 (L) 3.77 - 5.28 x10E6/uL   Hemoglobin 9.5 (L) 11.1 - 15.9 g/dL   Hematocrit 53.6 (L) 64.4 - 46.6 %   MCV 92 79 - 97 fL   MCH 27.9 26.6 - 33.0 pg   MCHC 30.4 (L) 31.5 - 35.7 g/dL   RDW 03.4 74.2 - 59.5 %   Platelets 279 150 - 450 x10E3/uL   Neutrophils 67 Not Estab. %   Lymphs 21 Not Estab. %   Monocytes 4 Not Estab. %   Eos 6 Not Estab. %   Basos 1 Not Estab. %   Neutrophils Absolute 5.8 1.4 - 7.0 x10E3/uL   Lymphocytes Absolute 1.8 0.7 - 3.1 x10E3/uL   Monocytes Absolute 0.4 0.1 - 0.9 x10E3/uL   EOS (ABSOLUTE) 0.5 (H) 0.0 - 0.4 x10E3/uL   Basophils Absolute 0.1 0.0 - 0.2 x10E3/uL   Immature Granulocytes 1 Not Estab. %   Immature Grans (Abs) 0.0  0.0 - 0.1 x10E3/uL  CMP14+EGFR     Status: Abnormal   Collection Time: 02/03/24 11:36 AM  Result Value Ref Range   Glucose 83 70 - 99 mg/dL   BUN 8 8 - 27 mg/dL   Creatinine, Ser 4.09 0.57 - 1.00 mg/dL   eGFR 86 >81 XB/JYN/8.29   BUN/Creatinine Ratio 12 12 - 28   Sodium 145 (H) 134 - 144 mmol/L   Potassium 4.1 3.5 - 5.2 mmol/L   Chloride 108 (H) 96 - 106 mmol/L   CO2 23 20 - 29 mmol/L   Calcium  8.9 8.7 - 10.3 mg/dL   Total Protein 5.4 (L) 6.0 - 8.5 g/dL   Albumin 3.4 (L) 3.7 - 4.7 g/dL   Globulin, Total 2.0 1.5 - 4.5 g/dL   Bilirubin Total 0.3 0.0 - 1.2 mg/dL   Alkaline Phosphatase 75 44 - 121 IU/L   AST 14 0 - 40 IU/L   ALT 11 0 - 32 IU/L  CBC with Differential/Platelet     Status: Abnormal   Collection Time: 02/13/24  2:03 AM  Result Value Ref Range   WBC 5.9 4.0 - 10.5 K/uL   RBC 3.48 (L) 3.87 - 5.11 MIL/uL   Hemoglobin 9.8 (L) 12.0 - 15.0 g/dL   HCT 56.2 (L) 13.0 - 86.5 %   MCV 93.1 80.0 - 100.0 fL   MCH 28.2 26.0 - 34.0 pg   MCHC 30.2 30.0 - 36.0 g/dL   RDW 78.4 69.6 - 29.5 %   Platelets 293 150 - 400 K/uL   nRBC 0.0 0.0 - 0.2 %   Neutrophils Relative % 38 %   Neutro Abs 2.3 1.7 - 7.7 K/uL   Lymphocytes Relative 44 %   Lymphs Abs 2.6 0.7 - 4.0 K/uL    Monocytes Relative 6 %   Monocytes Absolute 0.4 0.1 - 1.0 K/uL   Eosinophils Relative 11 %   Eosinophils Absolute 0.6 (H) 0.0 - 0.5 K/uL   Basophils Relative 1 %   Basophils Absolute 0.1 0.0 - 0.1 K/uL   Immature Granulocytes 0 %   Abs Immature Granulocytes 0.01 0.00 - 0.07 K/uL    Comment: Performed at [facility], [address] [place], [place] [address]  Comprehensive metabolic panel     Status: Abnormal   Collection Time: 02/13/24  2:03 AM  Result Value Ref Range   Sodium 141 135 - 145 mmol/L   Potassium 4.4 3.5 - 5.1 mmol/L   Chloride 106 98 - 111 mmol/L   CO2 29 22 - 32 mmol/L   Glucose, Bld 98 70 - 99 mg/dL    Comment: Glucose reference range applies only to samples taken after fasting for at least 8 hours.   BUN 12 8 - 23 mg/dL   Creatinine, Ser 2.44 0.44 - 1.00 mg/dL   Calcium  9.2 8.9 - 10.3 mg/dL   Total Protein 6.0 (L) 6.5 - 8.1 g/dL   Albumin 3.1 (L) 3.5 - 5.0 g/dL   AST 37 15 - 41 U/L   ALT 21 0 - 44 U/L   Alkaline Phosphatase 63 38 - 126 U/L   Total Bilirubin 0.7 0.0 - 1.2 mg/dL   GFR, Estimated >01 >02 mL/min    Comment: (NOTE) Calculated using the CKD-EPI Creatinine Equation (2021)    Anion gap 6 5 - 15    Comment: Performed at [facility], [address] [place], [place] [address]  Lipase, blood     Status: None   Collection  Time: 02/13/24  2:03 AM  Result Value Ref Range   Lipase 28 11 - 51 U/L    Comment: Performed at [facility], [address] [place], [place] [address]  Troponin I (High Sensitivity)     Status: Abnormal   Collection Time: 02/13/24  2:03 AM  Result Value Ref Range   Troponin I (High Sensitivity) 18 (H) <18 ng/L    Comment: (NOTE) Elevated high sensitivity troponin I (hsTnI) values and significant  changes across serial measurements may suggest ACS but many other  chronic and acute conditions are known to elevate hsTnI results.  Refer to the "Links" section for chest pain algorithms and additional   guidance. Performed at [facility], [address] [place], [place] [address]   Magnesium     Status: None   Collection Time: 02/13/24  2:03 AM  Result Value Ref Range   Magnesium 2.0 1.7 - 2.4 mg/dL    Comment: Performed at [facility], [address] [place], [place] [address]  Procalcitonin     Status: None   Collection Time: 02/13/24  2:03 AM  Result Value Ref Range   Procalcitonin <0.10 ng/mL    Comment:        Interpretation: PCT (Procalcitonin) <= 0.5 ng/mL: Systemic infection (sepsis) is not likely. Local bacterial infection is possible. (NOTE)       Sepsis PCT Algorithm           Lower Respiratory Tract                                      Infection PCT Algorithm    ----------------------------     ----------------------------         PCT < 0.25 ng/mL                PCT < 0.10 ng/mL          Strongly encourage             Strongly discourage   discontinuation of antibiotics    initiation of antibiotics    ----------------------------     -----------------------------       PCT 0.25 - 0.50 ng/mL            PCT 0.10 - 0.25 ng/mL               OR       >80% decrease in PCT            Discourage initiation of                                            antibiotics      Encourage discontinuation           of antibiotics    ----------------------------     -----------------------------         PCT >= 0.50 ng/mL              PCT 0.26 - 0.50 ng/mL               AND        <80% decrease in PCT             Encourage initiation of  antibiotics       Encourage continuation           of antibiotics    ----------------------------     -----------------------------        PCT >= 0.50 ng/mL                  PCT > 0.50 ng/mL               AND         increase in PCT                  Strongly encourage                                      initiation of antibiotics    Strongly encourage escalation           of  antibiotics                                     -----------------------------                                           PCT <= 0.25 ng/mL                                                 OR                                        > 80% decrease in PCT                                      Discontinue / Do not initiate                                             antibiotics  Performed at [facility], [address] [place], [place] [address]   Brain natriuretic peptide     Status: Abnormal   Collection Time: 02/13/24  2:03 AM  Result Value Ref Range   B Natriuretic Peptide 730.3 (H) 0.0 - 100.0 pg/mL    Comment: Performed at [facility], [address] [place], [place] [address]  Blood culture (routine x 2)     Status: None   Collection Time: 02/13/24  3:56 AM   Specimen: BLOOD LEFT ARM  Result Value Ref Range   Specimen Description BLOOD LEFT ARM    Special Requests      BOTTLES DRAWN AEROBIC AND ANAEROBIC Blood Culture adequate volume   Culture      NO GROWTH 5 DAYS Performed at [facility], [address] [place], [place] [address]    Report Status 02/18/2024 FINAL   Blood culture (routine x 2)     Status: None   Collection Time: 02/13/24  3:56 AM   Specimen: BLOOD RIGHT ARM  Result Value Ref Range   Specimen Description BLOOD RIGHT ARM    Special Requests      BOTTLES DRAWN AEROBIC AND ANAEROBIC Blood Culture results may not be optimal due to an inadequate volume of blood received in culture bottles   Culture      NO GROWTH 5 DAYS Performed at [facility], [address] [place], [place] [address]    Report Status 02/18/2024 FINAL   Legionella Pneumophila Serogp 1 Ur Ag     Status: None   Collection Time: 02/13/24  3:56 AM  Result Value Ref Range   L. pneumophila Serogp 1 Ur Ag Negative Negative    Comment: (NOTE) Presumptive negative for L. pneumophila serogroup 1 antigen in urine, suggesting no recent or current  infection. Legionnaires' disease cannot be ruled out since other serogroups and species may also cause disease. Performed At: [facility] [address] [place], [place] [id number] Pearlean Botts MD [id number]    Source of Sample URINE, RANDOM     Comment: Performed at [facility], [address] [place], [place] [address]  Strep pneumoniae urinary antigen     Status: None   Collection Time: 02/13/24  3:56 AM  Result Value Ref Range   Strep Pneumo Urinary Antigen NEGATIVE NEGATIVE    Comment:        Infection due to S. pneumoniae cannot be absolutely ruled out since the antigen present may be below the detection limit of the test. Performed at [facility], [address] [place], [place] [address]   Urinalysis, Routine w reflex microscopic -Urine, Clean Catch     Status: Abnormal   Collection Time: 02/13/24  3:57 AM  Result Value Ref Range   Color, Urine YELLOW (A) YELLOW   APPearance CLEAR (A) CLEAR   Specific Gravity, Urine 1.008 1.005 - 1.030   pH 7.0 5.0 - 8.0   Glucose, UA NEGATIVE NEGATIVE mg/dL   Hgb urine dipstick NEGATIVE NEGATIVE   Bilirubin Urine NEGATIVE NEGATIVE   Ketones, ur NEGATIVE NEGATIVE mg/dL   Protein, ur NEGATIVE NEGATIVE mg/dL   Nitrite NEGATIVE NEGATIVE   Leukocytes,Ua TRACE (A) NEGATIVE   RBC / HPF 0-5 0 - 5 RBC/hpf   WBC, UA 0-5 0 - 5 WBC/hpf   Bacteria, UA NONE SEEN NONE SEEN   Squamous Epithelial / HPF 0-5 0 - 5 /HPF   Mucus PRESENT     Comment: Performed at [facility], [address] [place], [place] [address]  Troponin I (High Sensitivity)     Status: None   Collection Time: 02/13/24  3:57 AM  Result Value Ref Range   Troponin I (High Sensitivity) 17 <18 ng/L    Comment: (NOTE) Elevated high sensitivity troponin I (hsTnI) values and significant  changes across serial measurements may suggest ACS but many other  chronic and acute conditions are known to elevate hsTnI results.  Refer to the  "Links" section for chest pain algorithms and additional  guidance. Performed at [facility], [address] [place], [place] [address]   Resp panel by RT-PCR (RSV, Flu A&B, Covid) Anterior Nasal Swab     Status: None   Collection Time: 02/13/24  3:58 AM   Specimen: Anterior Nasal Swab  Result Value Ref Range   SARS Coronavirus 2 by RT PCR NEGATIVE NEGATIVE    Comment: (NOTE) SARS-CoV-2 target nucleic acids are NOT DETECTED.  The SARS-CoV-2 RNA is  generally detectable in upper respiratory specimens during the acute phase of infection. The lowest concentration of SARS-CoV-2 viral copies this assay can detect is 138 copies/mL. A negative result does not preclude SARS-Cov-2 infection and should not be used as the sole basis for treatment or other patient management decisions. A negative result may occur with  improper specimen collection/handling, submission of specimen other than nasopharyngeal swab, presence of viral mutation(s) within the areas targeted by this assay, and inadequate number of viral copies(<138 copies/mL). A negative result must be combined with clinical observations, patient history, and epidemiological information. The expected result is Negative.  Fact Sheet for Patients:  BloggerCourse.com  Fact Sheet for Healthcare Providers:  SeriousBroker.it  This test is no t yet approved or cleared by the [place]  FDA and  has been authorized for detection and/or diagnosis of SARS-CoV-2 by FDA under an Emergency Use Authorization (EUA). This EUA will remain  in effect (meaning this test can be used) for the duration of the COVID-19 declaration under Section 564(b)(1) of the Act, 21 U.S.C.section 360bbb-3(b)(1), unless the authorization is terminated  or revoked sooner.       Influenza A by PCR NEGATIVE NEGATIVE   Influenza B by PCR NEGATIVE NEGATIVE    Comment: (NOTE) The Xpert Xpress  SARS-CoV-2/FLU/RSV plus assay is intended as an aid in the diagnosis of influenza from Nasopharyngeal swab specimens and should not be used as a sole basis for treatment. Nasal washings and aspirates are unacceptable for Xpert Xpress SARS-CoV-2/FLU/RSV testing.  Fact Sheet for Patients: BloggerCourse.com  Fact Sheet for Healthcare Providers: SeriousBroker.it  This test is not yet approved or cleared by the [place]  FDA and has been authorized for detection and/or diagnosis of SARS-CoV-2 by FDA under an Emergency Use Authorization (EUA). This EUA will remain in effect (meaning this test can be used) for the duration of the COVID-19 declaration under Section 564(b)(1) of the Act, 21 U.S.C. section 360bbb-3(b)(1), unless the authorization is terminated or revoked.     Resp Syncytial Virus by PCR NEGATIVE NEGATIVE    Comment: (NOTE) Fact Sheet for Patients: BloggerCourse.com  Fact Sheet for Healthcare Providers: SeriousBroker.it  This test is not yet approved or cleared by the [place]  FDA and has been authorized for detection and/or diagnosis of SARS-CoV-2 by FDA under an Emergency Use Authorization (EUA). This EUA will remain in effect (meaning this test can be used) for the duration of the COVID-19 declaration under Section 564(b)(1) of the Act, 21 U.S.C. section 360bbb-3(b)(1), unless the authorization is terminated or revoked.  Performed at [facility], [address] [place], [place] [address]   MRSA Next Gen by PCR, Nasal     Status: None   Collection Time: 02/13/24 11:33 AM   Specimen: Nasal Mucosa; Nasal Swab  Result Value Ref Range   MRSA by PCR Next Gen NOT DETECTED NOT DETECTED    Comment: (NOTE) The GeneXpert MRSA Assay (FDA approved for NASAL specimens only), is one component of a comprehensive MRSA colonization surveillance program. It is  not intended to diagnose MRSA infection nor to guide or monitor treatment for MRSA infections. Test performance is not FDA approved in patients less than 69 years old. Performed at [facility], [address] [place], [place] [address]   Respiratory (~20 pathogens) panel by PCR     Status: None   Collection Time: 02/13/24  4:08 PM   Specimen: Nasopharyngeal Swab; Respiratory  Result Value Ref Range   Adenovirus NOT DETECTED NOT  DETECTED   Coronavirus 229E NOT DETECTED NOT DETECTED    Comment: (NOTE) The Coronavirus on the Respiratory Panel, DOES NOT test for the novel  Coronavirus (2019 nCoV)    Coronavirus HKU1 NOT DETECTED NOT DETECTED   Coronavirus NL63 NOT DETECTED NOT DETECTED   Coronavirus OC43 NOT DETECTED NOT DETECTED   Metapneumovirus NOT DETECTED NOT DETECTED   Rhinovirus / Enterovirus NOT DETECTED NOT DETECTED   Influenza A NOT DETECTED NOT DETECTED   Influenza B NOT DETECTED NOT DETECTED   Parainfluenza Virus 1 NOT DETECTED NOT DETECTED   Parainfluenza Virus 2 NOT DETECTED NOT DETECTED   Parainfluenza Virus 3 NOT DETECTED NOT DETECTED   Parainfluenza Virus 4 NOT DETECTED NOT DETECTED   Respiratory Syncytial Virus NOT DETECTED NOT DETECTED   Bordetella pertussis NOT DETECTED NOT DETECTED   Bordetella Parapertussis NOT DETECTED NOT DETECTED   Chlamydophila pneumoniae NOT DETECTED NOT DETECTED   Mycoplasma pneumoniae NOT DETECTED NOT DETECTED    Comment: Performed at [facility], [address], [place], [place] [address]  CBC     Status: Abnormal   Collection Time: 02/14/24  5:01 AM  Result Value Ref Range   WBC 5.8 4.0 - 10.5 K/uL   RBC 3.37 (L) 3.87 - 5.11 MIL/uL   Hemoglobin 9.6 (L) 12.0 - 15.0 g/dL   HCT 60.4 (L) 54.0 - 98.1 %   MCV 91.1 80.0 - 100.0 fL   MCH 28.5 26.0 - 34.0 pg   MCHC 31.3 30.0 - 36.0 g/dL   RDW 19.1 47.8 - 29.5 %   Platelets 262 150 - 400 K/uL   nRBC 0.0 0.0 - 0.2 %    Comment: Performed at [facility], [address] [place], [place] [address]  Basic metabolic panel     Status: None   Collection Time: 02/14/24  5:01 AM  Result Value Ref Range   Sodium 141 135 - 145 mmol/L   Potassium 4.1 3.5 - 5.1 mmol/L   Chloride 105 98 - 111 mmol/L   CO2 28 22 - 32 mmol/L   Glucose, Bld 88 70 - 99 mg/dL    Comment: Glucose reference range applies only to samples taken after fasting for at least 8 hours.   BUN 15 8 - 23 mg/dL   Creatinine, Ser 8.65 0.44 - 1.00 mg/dL   Calcium  9.0 8.9 - 10.3 mg/dL   GFR, Estimated >78 >46 mL/min    Comment: (NOTE) Calculated using the CKD-EPI Creatinine Equation (2021)    Anion gap 8 5 - 15    Comment: Performed at [facility], [address] [place], [place] [address]  Basic metabolic panel with GFR     Status: Abnormal   Collection Time: 02/15/24  3:29 AM  Result Value Ref Range   Sodium 139 135 - 145 mmol/L   Potassium 3.8 3.5 - 5.1 mmol/L   Chloride 105 98 - 111 mmol/L   CO2 28 22 - 32 mmol/L   Glucose, Bld 107 (H) 70 - 99 mg/dL    Comment: Glucose reference range applies only to samples taken after fasting for at least 8 hours.   BUN 23 8 - 23 mg/dL   Creatinine, Ser 2.84 0.44 - 1.00 mg/dL   Calcium  9.1 8.9 - 10.3 mg/dL   GFR, Estimated 59 (L) >60 mL/min    Comment: (NOTE) Calculated using the CKD-EPI Creatinine Equation (2021)    Anion gap 6 5 - 15    Comment: Performed at  [facility], [address] [place], [place] [address]  CBC     Status: Abnormal   Collection Time: 02/15/24  3:29 AM  Result Value Ref Range   WBC 5.9 4.0 - 10.5 K/uL   RBC 3.40 (L) 3.87 - 5.11 MIL/uL   Hemoglobin 9.5 (L) 12.0 - 15.0 g/dL   HCT 60.4 (L) 54.0 - 98.1 %   MCV 90.0 80.0 - 100.0 fL   MCH 27.9 26.0 - 34.0 pg   MCHC 31.0 30.0 - 36.0 g/dL   RDW 19.1 47.8 - 29.5 %   Platelets 245 150 - 400 K/uL   nRBC 0.0 0.0 - 0.2 %    Comment: Performed at [facility], [address], [place], [place] [address]  Magnesium     Status: None    Collection Time: 02/15/24  3:29 AM  Result Value Ref Range   Magnesium 2.0 1.7 - 2.4 mg/dL    Comment: Performed at [facility], [address] [place], [place] [address]  Phosphorus     Status: Abnormal   Collection Time: 02/15/24  3:29 AM  Result Value Ref Range   Phosphorus 5.1 (H) 2.5 - 4.6 mg/dL    Comment: Performed at [facility], [address] [place], [place] [address]  NM Myocar Multi W/Spect W/Wall Motion / EF     Status: None   Collection Time: 02/15/24 11:48 AM  Result Value Ref Range   Rest HR 68.0 bpm   Rest BP 165/65 mmHg   Exercise duration (min) 1 min   Exercise duration (sec) 0 sec   Estimated workload 1.0    Peak HR 88 bpm   Peak BP 165/65 mmHg   MPHR 137 bpm   Percent HR 64.0 %   ST Depression (mm) 0 mm   Rest Nuclear Isotope Dose 11.1 mCi   Stress Nuclear Isotope Dose 33.0 mCi   SSS 0.0    SRS 1.0    SDS 0.0    TID 0.91    LV sys vol 35.0 mL   LV dias vol 89.0 46 - 106 mL   Nuc Stress EF 61 %      Assessment & Plan:  Continue current medications.  Add back Lasix  tablet, 2-3 times a week.  Rest and monitor blood pressure at home. Problem List Items Addressed This Visit     Essential hypertension, benign - Primary   HLD (hyperlipidemia)   Orthostatic hypotension   CAD (coronary artery disease)   Other Visit Diagnoses       Bilateral upper lobe community acquired pneumonia           Return in about 3 weeks (around 03/15/2024).   Total time spent: 30 minutes  Aisha Hove, MD  02/23/2024   This document may have been prepared by [facility] Voice Recognition software and as such may include unintentional dictation errors.

## 2024-02-24 ENCOUNTER — Other Ambulatory Visit: Payer: Self-pay

## 2024-02-24 NOTE — Transitions of Care (Post Inpatient/ED Visit) (Signed)
 Transition of Care week 2  Visit Note  02/24/2024  Name: Sarah Riddle MRN: [id number]          DOB: [birth year]  Situation: Patient enrolled in [facility] 30-day program. Visit completed with Oma Bias by telephone.   Background:     Past Medical History:  Diagnosis Date   A-fib [facility])    Accelerated hypertension 10/31/2022   Age related osteoporosis    Cancer (HCC)    Melanoma x 2 -- Lt Leg -1976 Rt Arm - 2014   Chronic bilateral low back pain    Chronic kidney disease    Complication of anesthesia    DVT (deep venous thrombosis) (HCC) 1960   right leg  was on BC pills   Dysrhythmia    Family history of breast cancer    Family history of colon cancer    Family history of melanoma    Family history of prostate cancer    Fibrocystic breast disease    GERD (gastroesophageal reflux disease)    Headache    migraine   Heart murmur    not heard now.   History of kidney stones    Hyperlipidemia    Hypertension    IBS (irritable bowel syndrome)    Lumbago    Mitral valve prolapse    Neuropathy    Obstructive sleep apnea    Osteoarthritis of shoulder    PONV (postoperative nausea and vomiting)     Assessment: Patient Reported Symptoms: Cognitive Cognitive Status: Alert and oriented to person, place, and time      Neurological Neurological Review of Symptoms: Numbness Neurological Management Strategies: Adequate rest, Medical device, Medication therapy Neurological Self-Management Outcome: 3 (uncertain) Neurological Comment: She takes 800mg  of Gabapentin  three times a day  HEENT HEENT Symptoms Reported: No symptoms reported      Cardiovascular Cardiovascular Symptoms Reported: Dizziness, Lightheadness, Swelling in legs or feet Does patient have uncontrolled Hypertension?: No Cardiovascular Conditions: Dysrhythmia, Heart failure, High blood cholesterol, Hypertension Cardiovascular Management Strategies: Medication therapy, Weight management, Fluid modification,  Activity Do You Have a Working Readable Scale?: Yes Weight: 186 lb 9.6 oz (84.6 kg) Cardiovascular Self-Management Outcome: 3 (uncertain) Cardiovascular Comment: Flucuating weight. Low blood pressure 127/55  Respiratory Respiratory Symptoms Reported: Dry cough Additional Respiratory Details: CPAP at night with oxygen  and has PRN oxygen  at 2l/min as needed Respiratory Conditions: Sleep disordered breathing Respiratory Self-Management Outcome: 4 (good) Respiratory Comment: Cough is improving. May cough for weeks  Endocrine Patient reports the following symptoms related to hypoglycemia or hyperglycemia : No symptoms reported Is patient diabetic?: No    Gastrointestinal Gastrointestinal Symptoms Reported: No symptoms reported   Nutrition Risk Screen (CP): No indicators present  Genitourinary Genitourinary Symptoms Reported: No symptoms reported    Integumentary Integumentary Symptoms Reported: Night sweats Skin Self-Management Outcome: 4 (good)  Musculoskeletal Musculoskelatal Symptoms Reviewed: Unsteady gait, Weakness Additional Musculoskeletal Details: Tingling in legs and feet Musculoskeletal Conditions: Back pain, Osteoporosis, Osteoarthritis, Mobility limited, Unsteady gait Musculoskeletal Management Strategies: Medical device, Weight management, Routine screening, Medication therapy, Activity Musculoskeletal Self-Management Outcome: 4 (good) Falls in the past year?: Yes Number of falls in past year: 2 or more Was there an injury with Fall?: Yes Fall Risk Category Calculator: 3 Patient Fall Risk Level: High Fall Risk Patient at Risk for Falls Due to: History of fall(s), Impaired mobility Fall risk Follow up: Falls prevention discussed, Education provided  Psychosocial Psychosocial Symptoms Reported: No symptoms reported         Vitals:  02/24/24 1136  BP: (!) 127/55  Pulse: (!) 53    Medications Reviewed Today     Reviewed by Claudene Crystal, RN (Case Manager) on  02/24/24 at 1059  Med List Status: <None>   Medication Order Taking? Sig Documenting Provider Last Dose Status Informant  acetaminophen  (TYLENOL ) 500 MG tablet [id number]  Take 500 mg by mouth every 6 (six) hours as needed for moderate pain (pain score 4-6) or mild pain (pain score 1-3). Once a day [provider]  Active Child           Med Note Ruby Corporal Feb 13, 2024  7:45 AM) prn  alendronate  (FOSAMAX ) 70 MG tablet [id number]  Take 1 tablet (70 mg total) by mouth every Thursday. Take with a full glass of water on an empty stomach.  Patient taking differently: Take 70 mg by mouth every Tuesday. Take with a full glass of water on an empty stomach.   Aisha Hove, MD  Active Child  allopurinol  (ZYLOPRIM ) 100 MG tablet [id number]  Take 1 tablet (100 mg total) by mouth daily. Aisha Hove, MD  Active Child  apixaban  (ELIQUIS ) 5 MG TABS tablet [id number]  Take 1 tablet (5 mg total) by mouth 2 (two) times daily. Aisha Hove, MD  Active Child  azelastine  (ASTELIN ) 0.1 % nasal spray [id number]  Place 2 sprays into both nostrils at bedtime. Use in each nostril as directed  Patient taking differently: Place 2 sprays into both nostrils daily as needed for allergies or rhinitis. Use in each nostril as directed   Aisha Hove, MD  Active Child           Med Note Ruby Corporal Feb 13, 2024  7:42 AM) prn  cyanocobalamin  (VITAMIN B12) 1000 MCG tablet [id number]  Take 1,000 mcg by mouth daily. Monday to Friday [provider]  Active Child  diclofenac  Sodium (VOLTAREN ) 1 % GEL [id number]  Apply 2 g topically 3 (three) times daily. Althia Atlas, MD  Active   EPINEPHrine  0.3 mg/0.3 mL IJ SOAJ injection [id number]  Inject 0.3 mg into the muscle as needed for anaphylaxis. [provider]  Active Child           Med Note Ruby Corporal Feb 13, 2024  7:45 AM) prn  fluticasone  (FLONASE ) 50 MCG/ACT nasal spray [id number]  Place 2 sprays into both  nostrils daily as needed for allergies or rhinitis. [provider]  Active Child           Med Note Ruby Corporal Feb 13, 2024  7:50 AM) prn  furosemide  (LASIX ) 20 MG tablet [id number] Yes Take 20 mg by mouth 3 (three) times a week. Monday, Wednesday, Friday Althia Atlas, MD Taking Active   gabapentin  (NEURONTIN ) 800 MG tablet [id number]  TAKE 1 TABLET FOUR TIMES DAILY Trenda Frisk, FNP  Active Child  levocetirizine (XYZAL ) 5 MG tablet [id number]  TAKE 1 TABLET EVERY EVENING Aisha Hove, MD  Active Child  losartan  (COZAAR ) 25 MG tablet [id number]  Take 1 tablet (25 mg total) by mouth daily. Cherrie Cornwall, MD  Active   meclizine  (ANTIVERT ) 12.5 MG tablet [id number]  Take 1 tablet (12.5 mg total) by mouth 3 (three) times daily as needed for dizziness. Glendale Landmark, NP  Active Child           Med Note Ruby Corporal Feb 13, 2024  7:42 AM) prn  metoprolol  succinate (TOPROL -XL) 25 MG 24 hr tablet [id number]  Take 1 tablet (25 mg total) by mouth daily. Althia Atlas, MD  Active   nitroGLYCERIN  (NITROSTAT ) 0.4 MG SL tablet [id number]  Place 1 tablet (0.4 mg total) under the tongue every 5 (five) minutes as needed for chest pain. Aisha Hove, MD  Active Child           Med Note Ruby Corporal Feb 13, 2024  7:40 AM) prn  OXYGEN  [id number]  Inhale 2 L/min into the lungs at bedtime. And as needed for SOB [provider]  Active Child  pantoprazole  (PROTONIX ) 40 MG tablet [id number]  TAKE 1 TABLET EVERY DAY Cherrie Cornwall, MD  Active Child  Polyethyl Glyc-Propyl Glyc PF (SYSTANE HYDRATION PF) 0.4-0.3 % SOLN [id number]  Apply 1 drop to eye daily as needed (dry eyes). [provider]  Active Child           Med Note Ruby Corporal Feb 13, 2024  7:45 AM) prn  rosuvastatin  (CRESTOR ) 40 MG tablet [id number]  TAKE 1 TABLET EVERY DAY  Patient taking differently: Take 40 mg by mouth at bedtime.   Cherrie Cornwall, MD  Active Child   saccharomyces boulardii (FLORASTOR) 250 MG capsule [id number]  Take 1 capsule (250 mg total) by mouth 2 (two) times daily for 7 days. Althia Atlas, MD  Active   sertraline  (ZOLOFT ) 25 MG tablet [id number]  TAKE 1 TABLET THREE TIMES DAILY Aisha Hove, MD  Active Child  Vitamin D , Ergocalciferol , 50000 units CAPS [id number]  Take 1 capsule by mouth once a week. Aisha Hove, MD  Active Child           Med Note Ruby Corporal Feb 13, 2024  7:50 AM) Friday   Med List Note Isidore Mares, [place] 05/03/22 1307): Pt has CPAP Machine that she uses with 2L of oxygen              Recommendation:   Weigh daily and record. Take an extra Lasix  if there is more than 1-2 pounds of extra weight Monitor BP daily. Report consistent BP of 90/55 or lower  Use mobility device for ambulation  Fall prevention. Stand for a few seconds before ambulating Drive with caution.   Follow Up Plan:   Telephone follow-up in 1 week  Gareld June, BSN, RN [place]  VBCI - [facility] RN Care Manager [phone]

## 2024-02-24 NOTE — Patient Instructions (Signed)
 Visit Information  Thank you for taking time to visit with me today. Please don't hesitate to contact me if I can be of assistance to you before our next scheduled telephone appointment.  Our next appointment is by telephone on Friday May 30th at 11am  Following is a copy of your care plan:   Goals Addressed             This Visit's Progress    VBCI Transitions of Care (TOC) Care Plan       Problems: (reviewed 02/24/24) Recent Hospitalization for treatment of CHF Functional/Safety concern: History of Falls  Goal: (reviewed 02/24/24) Over the next 30 days, the patient will not experience hospital readmission  Interventions: (reviewed 02/24/24)  Heart Failure Interventions: Provided education on low sodium diet Assessed need for readable accurate scales in home Provided education about placing scale on hard, flat surface Advised patient to weigh each morning after emptying bladder Discussed importance of daily weight and advised patient to weigh and record daily Reviewed role of diuretics in prevention of fluid overload and management of heart failure; Discussed the importance of keeping all appointments with provider Screening for signs and symptoms of depression related to chronic disease state  Assessed social determinant of health barriers  Take Lasix  three times a week and as need for weight gain of 1-2 pounds a day Monitor BP daily for Hypotension. Contact provider for dystolic below 50  Patient Self Care Activities:  Attend all scheduled provider appointments Call pharmacy for medication refills 3-7 days in advance of running out of medications Call provider office for new concerns or questions  Notify RN Care Manager of [facility] call rescheduling needs Participate in Transition of Care Program/Attend TOC scheduled calls keep legs up while sitting use salt in moderation watch for swelling in feet, ankles and legs every day weigh myself daily track symptoms and what helps  feel better or worse  Plan:  Telephone follow up appointment with care management team member scheduled for:  Friday May 30th at 11:00am        Patient verbalizes understanding of instructions and care plan provided today and agrees to view in MyChart. Active MyChart status and patient understanding of how to access instructions and care plan via MyChart confirmed with patient.     The patient has been provided with contact information for the care management team and has been advised to call with any health related questions or concerns.   Please call the care guide team at [phone] if you need to cancel or reschedule your appointment.   Please call the Suicide and Crisis Lifeline: 988 call the [place]  National Suicide Prevention Lifeline: 318-714-9517 or TTY: (680)378-7891 TTY 618-345-6847) to talk to a trained counselor if you are experiencing a Mental Health or Behavioral Health Crisis or need someone to talk to.  Gareld June, BSN, RN [place]  VBCI - [facility] RN Care Manager [phone]

## 2024-02-27 ENCOUNTER — Ambulatory Visit: Admitting: Cardiovascular Disease

## 2024-02-28 DIAGNOSIS — I480 Paroxysmal atrial fibrillation: Secondary | ICD-10-CM | POA: Diagnosis not present

## 2024-02-28 DIAGNOSIS — I2510 Atherosclerotic heart disease of native coronary artery without angina pectoris: Secondary | ICD-10-CM | POA: Diagnosis not present

## 2024-03-01 DIAGNOSIS — C44529 Squamous cell carcinoma of skin of other part of trunk: Secondary | ICD-10-CM | POA: Diagnosis not present

## 2024-03-01 DIAGNOSIS — D0472 Carcinoma in situ of skin of left lower limb, including hip: Secondary | ICD-10-CM | POA: Diagnosis not present

## 2024-03-01 DIAGNOSIS — C44729 Squamous cell carcinoma of skin of left lower limb, including hip: Secondary | ICD-10-CM | POA: Diagnosis not present

## 2024-03-01 DIAGNOSIS — D485 Neoplasm of uncertain behavior of skin: Secondary | ICD-10-CM | POA: Diagnosis not present

## 2024-03-02 ENCOUNTER — Other Ambulatory Visit: Payer: Self-pay

## 2024-03-02 NOTE — Patient Instructions (Signed)
 Visit Information  Thank you for taking time to visit with me today. Please don't hesitate to contact me if I can be of assistance to you before our next scheduled telephone appointment.  Our next appointment is by telephone on Friday June 6th at 11:00am  Following is a copy of your care plan:   Goals Addressed             This Visit's Progress    VBCI Transitions of Care (TOC) Care Plan       Problems: (reviewed 03/02/24) Recent Hospitalization for treatment of CHF Functional/Safety concern: History of Falls  Goal: (reviewed 03/02/24) Over the next 30 days, the patient will not experience hospital readmission  Interventions: (reviewed 03/02/24)  Heart Failure Interventions: Provided education on low sodium diet Assessed need for readable accurate scales in home Provided education about placing scale on hard, flat surface Advised patient to weigh each morning after emptying bladder Discussed importance of daily weight and advised patient to weigh and record daily Reviewed role of diuretics in prevention of fluid overload and management of heart failure; Discussed the importance of keeping all appointments with provider Screening for signs and symptoms of depression related to chronic disease state  Assessed social determinant of health barriers  Take Lasix  three times a week and as need for weight gain of 1-2 pounds a day Monitor BP daily for Hypotension. Contact provider for dystolic below 50  Patient Self Care Activities:  Attend all scheduled provider appointments Call pharmacy for medication refills 3-7 days in advance of running out of medications Call provider office for new concerns or questions  Notify RN Care Manager of [facility] call rescheduling needs Participate in Transition of Care Program/Attend TOC scheduled calls keep legs up while sitting use salt in moderation watch for swelling in feet, ankles and legs every day weigh myself daily track symptoms and what  helps feel better or worse  Plan:  Telephone follow up appointment with care management team member scheduled for:  Friday May 30th at 11:00am        Patient verbalizes understanding of instructions and care plan provided today and agrees to view in MyChart. Active MyChart status and patient understanding of how to access instructions and care plan via MyChart confirmed with patient.     The patient has been provided with contact information for the care management team and has been advised to call with any health related questions or concerns.   Please call the care guide team at [phone] if you need to cancel or reschedule your appointment.   Please call the Suicide and Crisis Lifeline: 988 call the [place]  National Suicide Prevention Lifeline: (770) 737-1687 or TTY: (414) 689-0519 TTY 604-235-6467) to talk to a trained counselor if you are experiencing a Mental Health or Behavioral Health Crisis or need someone to talk to.  Gareld June, BSN, RN [place]  VBCI - [facility] RN Care Manager [phone]

## 2024-03-05 ENCOUNTER — Ambulatory Visit: Admitting: Internal Medicine

## 2024-03-07 ENCOUNTER — Other Ambulatory Visit: Payer: Self-pay | Admitting: Cardiovascular Disease

## 2024-03-09 ENCOUNTER — Other Ambulatory Visit: Payer: Self-pay

## 2024-03-09 NOTE — Patient Instructions (Signed)
 Visit Information  Thank you for taking time to visit with me today. Please don't hesitate to contact me if I can be of assistance to you before our next scheduled telephone appointment.  Our next appointment is by telephone on Friday June 13th at 10:30am  Following is a copy of your care plan:   Goals Addressed             This Visit's Progress    VBCI Transitions of Care (TOC) Care Plan       Problems: (reviewed 03/09/24) Recent Hospitalization for treatment of CHF Pneumonia Functional/Safety concern: History of Falls  Goal: (reviewed 03/09/24) Over the next 30 days, the patient will not experience hospital readmission  Interventions: (reviewed 03/09/24)  Heart Failure Interventions: Provided education on low sodium diet Assessed need for readable accurate scales in home Provided education about placing scale on hard, flat surface Advised patient to weigh each morning after emptying bladder Discussed importance of daily weight and advised patient to weigh and record daily Reviewed role of diuretics in prevention of fluid overload and management of heart failure; Discussed the importance of keeping all appointments with provider Screening for signs and symptoms of depression related to chronic disease state  Assessed social determinant of health barriers  Take Lasix  three times a week and as need for weight gain of 1-2 pounds a day Monitor BP daily for Hypotension. Contact provider for dystolic below 50  Patient Self Care Activities: (reviewed 03/09/24) Attend all scheduled provider appointments Call pharmacy for medication refills 3-7 days in advance of running out of medications Call provider office for new concerns or questions  Notify RN Care Manager of [facility] call rescheduling needs Participate in Transition of Care Program/Attend TOC scheduled calls keep legs up while sitting use salt in moderation watch for swelling in feet, ankles and legs every day weigh myself  daily track symptoms and what helps feel better or worse  Plan:  Telephone follow up appointment with care management team member scheduled for:  Friday June 13th at 11:00am        Patient verbalizes understanding of instructions and care plan provided today and agrees to view in MyChart. Active MyChart status and patient understanding of how to access instructions and care plan via MyChart confirmed with patient.     The patient has been provided with contact information for the care management team and has been advised to call with any health related questions or concerns.   Please call the care guide team at [phone] if you need to cancel or reschedule your appointment.   Please call the Suicide and Crisis Lifeline: 988 call the [place]  National Suicide Prevention Lifeline: 267 555 3003 or TTY: 856-091-3853 TTY (540)759-9868) to talk to a trained counselor if you are experiencing a Mental Health or Behavioral Health Crisis or need someone to talk to.  Gareld June, BSN, RN [place]  VBCI - [employer] [facility] RN Care Manager [phone]

## 2024-03-09 NOTE — Transitions of Care (Post Inpatient/ED Visit) (Signed)
 Transition of Care week 4  Visit Note  03/09/2024  Name: Sarah Riddle MRN: [id number]          DOB: Apr 19, 1941  Situation: Patient enrolled in [facility] 30-day program. Visit completed with Oma Bias by telephone.   Background:    Past Medical History:  Diagnosis Date   A-fib [facility])    Accelerated hypertension 10/31/2022   Age related osteoporosis    Cancer (HCC)    Melanoma x 2 -- Lt Leg -1976 Rt Arm - 2014   Chronic bilateral low back pain    Chronic kidney disease    Complication of anesthesia    DVT (deep venous thrombosis) (HCC) 1960   right leg  was on BC pills   Dysrhythmia    Family history of breast cancer    Family history of colon cancer    Family history of melanoma    Family history of prostate cancer    Fibrocystic breast disease    GERD (gastroesophageal reflux disease)    Headache    migraine   Heart murmur    not heard now.   History of kidney stones    Hyperlipidemia    Hypertension    IBS (irritable bowel syndrome)    Lumbago    Mitral valve prolapse    Neuropathy    Obstructive sleep apnea    Osteoarthritis of shoulder    PONV (postoperative nausea and vomiting)     Assessment: Patient Reported Symptoms: Cognitive Cognitive Status: Alert and oriented to person, place, and time      Neurological Neurological Review of Symptoms: Numbness Neurological Management Strategies: Medical device, Medication therapy, Activity Neurological Self-Management Outcome: 4 (good)  HEENT HEENT Symptoms Reported: No symptoms reported      Cardiovascular Cardiovascular Symptoms Reported: Dizziness, Swelling in legs or feet Does patient have uncontrolled Hypertension?: No Cardiovascular Conditions: Dysrhythmia, Heart failure, High blood cholesterol, Hypertension Cardiovascular Management Strategies: Medication therapy, Weight management, Fluid modification, Activity Do You Have a Working Readable Scale?: Yes Weight: 187 lb (84.8 kg) Cardiovascular  Self-Management Outcome: 4 (good) Cardiovascular Comment: Feels better this week  Respiratory Respiratory Symptoms Reported: Shortness of breath Other Respiratory Symptoms: When she leans over to do luandry and other activities she is SOB Additional Respiratory Details: CPAP at night with oxygen  at 2l/min Respiratory Conditions: Sleep disordered breathing Respiratory Self-Management Outcome: 4 (good) Respiratory Comment: Not coughing as much. Has a CXR on 03/15/24  Endocrine Patient reports the following symptoms related to hypoglycemia or hyperglycemia : No symptoms reported Is patient diabetic?: No    Gastrointestinal Gastrointestinal Symptoms Reported: No symptoms reported      Genitourinary Genitourinary Symptoms Reported: No symptoms reported    Integumentary Integumentary Symptoms Reported: No symptoms reported    Musculoskeletal Musculoskelatal Symptoms Reviewed: Unsteady gait, Weakness Additional Musculoskeletal Details: Tingling in legs and feet Musculoskeletal Conditions: Back pain, Osteoarthritis, Osteopenia, Mobility limited, Unsteady gait Musculoskeletal Management Strategies: Medical device, Weight management, Routine screening, Medication therapy, Activity Musculoskeletal Self-Management Outcome: 4 (good) Falls in the past year?: Yes Number of falls in past year: 2 or more Was there an injury with Fall?: Yes Fall Risk Category Calculator: 3 Patient Fall Risk Level: High Fall Risk Fall risk Follow up: Falls evaluation completed  Psychosocial Psychosocial Symptoms Reported: No symptoms reported         Vitals:   03/09/24 1042  BP: (!) 180/78    Medications Reviewed Today     Reviewed by Claudene Crystal, RN (Case Manager) on 03/09/24 at  1033  Med List Status: <None>   Medication Order Taking? Sig Documenting Provider Last Dose Status Informant  acetaminophen  (TYLENOL ) 500 MG tablet [id number] No Take 500 mg by mouth every 6 (six) hours as needed for moderate  pain (pain score 4-6) or mild pain (pain score 1-3). Once a day [provider] Taking Active Child           Med Note Ruby Corporal Feb 13, 2024  7:45 AM) prn  alendronate  (FOSAMAX ) 70 MG tablet [id number] No Take 1 tablet (70 mg total) by mouth every Thursday. Take with a full glass of water on an empty stomach.  Patient taking differently: Take 70 mg by mouth every Tuesday. Take with a full glass of water on an empty stomach.   Aisha Hove, MD Taking Active Child  allopurinol  (ZYLOPRIM ) 100 MG tablet [id number] No Take 1 tablet (100 mg total) by mouth daily. Aisha Hove, MD Taking Active Child  apixaban  (ELIQUIS ) 5 MG TABS tablet [id number] No Take 1 tablet (5 mg total) by mouth 2 (two) times daily. Aisha Hove, MD Taking Active Child  azelastine  (ASTELIN ) 0.1 % nasal spray [id number] No Place 2 sprays into both nostrils at bedtime. Use in each nostril as directed  Patient taking differently: Place 2 sprays into both nostrils daily as needed for allergies or rhinitis. Use in each nostril as directed   Aisha Hove, MD Taking Active Child           Med Note Ruby Corporal Feb 13, 2024  7:42 AM) prn  cyanocobalamin  (VITAMIN B12) 1000 MCG tablet [id number] No Take 1,000 mcg by mouth daily. Monday to Friday [provider] Taking Active Child  diclofenac  Sodium (VOLTAREN ) 1 % GEL [id number] No Apply 2 g topically 3 (three) times daily. Althia Atlas, MD Taking Active   EPINEPHrine  0.3 mg/0.3 mL IJ SOAJ injection [id number] No Inject 0.3 mg into the muscle as needed for anaphylaxis. [provider] Taking Active Child           Med Note Ruby Corporal Feb 13, 2024  7:45 AM) prn  ferrous sulfate  324 MG TBEC [id number]  Take 324 mg by mouth 2 (two) times daily. [provider]  Active   fluticasone  (FLONASE ) 50 MCG/ACT nasal spray [id number] No Place 2 sprays into both nostrils daily as needed for allergies or rhinitis.  [provider] Taking Active Child           Med Note Ruby Corporal Feb 13, 2024  7:50 AM) prn  furosemide  (LASIX ) 20 MG tablet [id number] No Take 20 mg by mouth 3 (three) times a week. Monday, Wednesday, Friday and PRN for weight gain of 1-2 pounds in one day Althia Atlas, MD Taking Active   gabapentin  (NEURONTIN ) 800 MG tablet [id number] No TAKE 1 TABLET FOUR TIMES DAILY Trenda Frisk, FNP Taking Active Child  levocetirizine (XYZAL ) 5 MG tablet [id number] No TAKE 1 TABLET EVERY EVENING Aisha Hove, MD Taking Active Child  losartan  (COZAAR ) 25 MG tablet [id number] No Take 1 tablet (25 mg total) by mouth daily. Cherrie Cornwall, MD Taking Active   meclizine  (ANTIVERT ) 12.5 MG tablet [id number] No Take 1 tablet (12.5 mg total) by mouth 3 (three) times daily as needed for dizziness. Glendale Landmark, NP Taking Active Child           Med Note (THOMAS, CONNELLAE H  Mon Feb 13, 2024  7:42 AM) prn  metoprolol  succinate (TOPROL -XL) 25 MG 24 hr tablet [id number]  TAKE 1 TABLET EVERY DAY Cherrie Cornwall, MD  Active   nitroGLYCERIN  (NITROSTAT ) 0.4 MG SL tablet [id number] No Place 1 tablet (0.4 mg total) under the tongue every 5 (five) minutes as needed for chest pain. Aisha Hove, MD Taking Active Child           Med Note Ruby Corporal Feb 13, 2024  7:40 AM) prn  OXYGEN  [id number] No Inhale 2 L/min into the lungs at bedtime. And as needed for SOB [provider] Taking Active Child  pantoprazole  (PROTONIX ) 40 MG tablet [id number] No TAKE 1 TABLET EVERY DAY Cherrie Cornwall, MD Taking Active Child  Polyethyl Glyc-Propyl Glyc PF (SYSTANE HYDRATION PF) 0.4-0.3 % SOLN [id number] No Apply 1 drop to eye daily as needed (dry eyes). [provider] Taking Active Child           Med Note Ruby Corporal Feb 13, 2024  7:45 AM) prn  rosuvastatin  (CRESTOR ) 40 MG tablet [id number] No TAKE 1 TABLET EVERY DAY  Patient taking differently: Take 40 mg by mouth  at bedtime.   Cherrie Cornwall, MD Taking Active Child  saccharomyces boulardii (FLORASTOR) 250 MG capsule [id number] No Take 1 capsule (250 mg total) by mouth 2 (two) times daily for 7 days. Althia Atlas, MD Taking Active   sertraline  (ZOLOFT ) 25 MG tablet [id number] No TAKE 1 TABLET THREE TIMES DAILY Aisha Hove, MD Taking Active Child  Vitamin D , Ergocalciferol , 50000 units CAPS [id number] No Take 1 capsule by mouth once a week. Aisha Hove, MD Taking Active Child           Med Note Ruby Corporal Feb 13, 2024  7:50 AM) Friday   Med List Note Isidore Mares, [place] 05/03/22 1307): Pt has CPAP Machine that she uses with 2L of oxygen              Recommendation:   Continue Current Plan of Care  Follow Up Plan:   Telephone follow-up in 1 week  Gareld June, BSN, RN   [facility] - [facility] RN Care Manager [phone]

## 2024-03-13 ENCOUNTER — Ambulatory Visit

## 2024-03-13 ENCOUNTER — Ambulatory Visit (INDEPENDENT_AMBULATORY_CARE_PROVIDER_SITE_OTHER): Admitting: Internal Medicine

## 2024-03-13 ENCOUNTER — Encounter: Payer: Self-pay | Admitting: Internal Medicine

## 2024-03-13 VITALS — BP 146/68 | HR 71 | Ht 71.0 in | Wt 190.4 lb

## 2024-03-13 DIAGNOSIS — I25112 Atherosclerotic heart disease of native coronary artery with refractory angina pectoris: Secondary | ICD-10-CM | POA: Diagnosis not present

## 2024-03-13 DIAGNOSIS — Z1231 Encounter for screening mammogram for malignant neoplasm of breast: Secondary | ICD-10-CM

## 2024-03-13 DIAGNOSIS — E782 Mixed hyperlipidemia: Secondary | ICD-10-CM

## 2024-03-13 DIAGNOSIS — I10 Essential (primary) hypertension: Secondary | ICD-10-CM | POA: Diagnosis not present

## 2024-03-13 DIAGNOSIS — D508 Other iron deficiency anemias: Secondary | ICD-10-CM

## 2024-03-13 DIAGNOSIS — J189 Pneumonia, unspecified organism: Secondary | ICD-10-CM

## 2024-03-13 MED ORDER — LOSARTAN POTASSIUM 25 MG PO TABS: 25.0000 mg | ORAL_TABLET | Freq: Every day | ORAL | 11 refills | Status: DC

## 2024-03-13 NOTE — Progress Notes (Signed)
 Established Patient Office Visit  Subjective:  Patient ID: Sarah Riddle, female    DOB: [birth year]  Age: 83 y.o. MRN: [id number]  Chief Complaint  Patient presents with   Follow-up    3 week follow up    Patient comes in for her follow-up today.  She says she is feeling better now and has some what more energy.  However her blood pressure is starting to get high especially in the evenings.  Among other medications she is also on losartan  25 mg once a day.  Will increase that to 25 twice a day.  Advised to continue monitoring at home.  Patient needs a chest x-ray follow-up for pneumonia. To continue rest of the medications.    No other concerns at this time.   Past Medical History:  Diagnosis Date   A-fib [facility])    Accelerated hypertension 10/31/2022   Age related osteoporosis    Cancer (HCC)    Melanoma x 2 -- Lt Leg -1976 Rt Arm - 2014   Chronic bilateral low back pain    Chronic kidney disease    Complication of anesthesia    DVT (deep venous thrombosis) (HCC) 1960   right leg  was on BC pills   Dysrhythmia    Family history of breast cancer    Family history of colon cancer    Family history of melanoma    Family history of prostate cancer    Fibrocystic breast disease    GERD (gastroesophageal reflux disease)    Headache    migraine   Heart murmur    not heard now.   History of kidney stones    Hyperlipidemia    Hypertension    IBS (irritable bowel syndrome)    Lumbago    Mitral valve prolapse    Neuropathy    Obstructive sleep apnea    Osteoarthritis of shoulder    PONV (postoperative nausea and vomiting)     Past Surgical History:  Procedure Laterality Date   ABDOMINAL HYSTERECTOMY     BACK SURGERY     BREAST BIOPSY Left 04/20/2023   us  bx/ heart clip/ path pending   BREAST BIOPSY Left 04/20/2023   US  LT BREAST BX W LOC DEV 1ST LESION IMG BX SPEC US  GUIDE 04/20/2023 [facility]   BREAST CYST ASPIRATION Left 2001   Negative   CARDIAC  ELECTROPHYSIOLOGY MAPPING AND ABLATION     EYE SURGERY     HUMERUS IM NAIL Right 05/05/2022   Procedure: INTRAMEDULLARY (IM) NAIL HUMERAL;  Surgeon: Laneta Pintos, MD;  Location: [facility];  Service: Orthopedics;  Laterality: Right;   HYSTEROTOMY     L4-L5  2020   titaum rebok cage   LEFT HEART CATH AND CORONARY ANGIOGRAPHY N/A 11/01/2022   Procedure: LEFT HEART CATH AND CORONARY ANGIOGRAPHY and possible PCI and stent;  Surgeon: Cherrie Cornwall, MD;  Location: [facility] INVASIVE CV LAB;  Service: Cardiovascular;  Laterality: N/A;   TOTAL HIP ARTHROPLASTY Left     Social History   Socioeconomic History   Marital status: Married    Spouse name: Not on file   Number of children: Not on file   Years of education: Not on file   Highest education level: Not on file  Occupational History   Not on file  Tobacco Use   Smoking status: Never   Smokeless tobacco: Never  Vaping Use   Vaping status: Never Used  Substance and Sexual Activity   Alcohol  use: No  Drug use: No   Sexual activity: Not Currently  Other Topics Concern   Not on file  Social History Narrative   Not on file   Social Drivers of Health   Financial Resource Strain: Low Risk  (09/23/2023)   Overall Financial Resource Strain (CARDIA)    Difficulty of Paying Living Expenses: Not hard at all  Food Insecurity: No Food Insecurity (02/16/2024)   Hunger Vital Sign    Worried About Running Out of Food in the Last Year: Never true    Ran Out of Food in the Last Year: Never true  Transportation Needs: No Transportation Needs (02/16/2024)   PRAPARE - Administrator, Civil Service (Medical): No    Lack of Transportation (Non-Medical): No  Physical Activity: Inactive (09/23/2023)   Exercise Vital Sign    Days of Exercise per Week: 0 days    Minutes of Exercise per Session: 0 min  Stress: No Stress Concern Present (09/23/2023)   [employer] of Occupational Health - Occupational Stress Questionnaire    Feeling of  Stress : Only a little  Social Connections: Moderately Isolated (02/13/2024)   Social Connection and Isolation Panel [NHANES]    Frequency of Communication with Friends and Family: More than three times a week    Frequency of Social Gatherings with Friends and Family: Twice a week    Attends Religious Services: Never    Database administrator or Organizations: No    Attends Banker Meetings: Never    Marital Status: Married  Catering manager Violence: Not At Risk (02/16/2024)   Humiliation, Afraid, Rape, and Kick questionnaire    Fear of Current or Ex-Partner: No    Emotionally Abused: No    Physically Abused: No    Sexually Abused: No    Family History  Problem Relation Age of Onset   Colon cancer Mother 48   Prostate cancer Father        dx 69s   Cancer Sister    Acute myelogenous leukemia Sister        dx 70s   Prostate cancer Brother    Melanoma Brother    Prostate cancer Brother    Melanoma Brother    HIV/AIDS Brother 62   Tuberculosis Paternal Aunt    Melanoma Other    Breast cancer Other    Melanoma Niece     Allergies  Allergen Reactions   Iodinated Contrast Media Shortness Of Breath and Other (See Comments)    Can be premedicated  Have been able to tolerate if given premeds (benadryl , prednisone )   Ibuprofen Other (See Comments)    "stomach problems"    Sulfa Antibiotics Other (See Comments)    "Stomach problems"  "Stomach problems"  "Stomach problems"  "Stomach problems"   Doxycycline Diarrhea and Nausea And Vomiting   Tetracyclines & Related Nausea Only and Nausea And Vomiting    Outpatient Medications Prior to Visit  Medication Sig   acetaminophen  (TYLENOL ) 500 MG tablet Take 500 mg by mouth every 6 (six) hours as needed for moderate pain (pain score 4-6) or mild pain (pain score 1-3). Once a day   alendronate  (FOSAMAX ) 70 MG tablet Take 1 tablet (70 mg total) by mouth every Thursday. Take with a full glass of water on an empty stomach.  (Patient taking differently: Take 70 mg by mouth every Tuesday. Take with a full glass of water on an empty stomach.)   allopurinol  (ZYLOPRIM ) 100 MG tablet Take 1 tablet (100  mg total) by mouth daily.   apixaban  (ELIQUIS ) 5 MG TABS tablet Take 1 tablet (5 mg total) by mouth 2 (two) times daily.   azelastine  (ASTELIN ) 0.1 % nasal spray Place 2 sprays into both nostrils at bedtime. Use in each nostril as directed (Patient taking differently: Place 2 sprays into both nostrils daily as needed for allergies or rhinitis. Use in each nostril as directed)   cyanocobalamin  (VITAMIN B12) 1000 MCG tablet Take 1,000 mcg by mouth daily. Monday to Friday   diclofenac  Sodium (VOLTAREN ) 1 % GEL Apply 2 g topically 3 (three) times daily.   EPINEPHrine  0.3 mg/0.3 mL IJ SOAJ injection Inject 0.3 mg into the muscle as needed for anaphylaxis.   ferrous sulfate  324 MG TBEC Take 324 mg by mouth 2 (two) times daily.   fluticasone  (FLONASE ) 50 MCG/ACT nasal spray Place 2 sprays into both nostrils daily as needed for allergies or rhinitis.   furosemide  (LASIX ) 20 MG tablet Take 20 mg by mouth 3 (three) times a week. Monday, Wednesday, Friday and PRN for weight gain of 1-2 pounds in one day   gabapentin  (NEURONTIN ) 800 MG tablet TAKE 1 TABLET FOUR TIMES DAILY   levocetirizine (XYZAL ) 5 MG tablet TAKE 1 TABLET EVERY EVENING   meclizine  (ANTIVERT ) 12.5 MG tablet Take 1 tablet (12.5 mg total) by mouth 3 (three) times daily as needed for dizziness.   metoprolol  succinate (TOPROL -XL) 25 MG 24 hr tablet TAKE 1 TABLET EVERY DAY   nitroGLYCERIN  (NITROSTAT ) 0.4 MG SL tablet Place 1 tablet (0.4 mg total) under the tongue every 5 (five) minutes as needed for chest pain.   OXYGEN  Inhale 2 L/min into the lungs at bedtime. And as needed for SOB   pantoprazole  (PROTONIX ) 40 MG tablet TAKE 1 TABLET EVERY DAY   Polyethyl Glyc-Propyl Glyc PF (SYSTANE HYDRATION PF) 0.4-0.3 % SOLN Apply 1 drop to eye daily as needed (dry eyes).   rosuvastatin   (CRESTOR ) 40 MG tablet TAKE 1 TABLET EVERY DAY (Patient taking differently: Take 40 mg by mouth at bedtime.)   sertraline  (ZOLOFT ) 25 MG tablet TAKE 1 TABLET THREE TIMES DAILY   Vitamin D , Ergocalciferol , 50000 units CAPS Take 1 capsule by mouth once a week.   [DISCONTINUED] losartan  (COZAAR ) 25 MG tablet Take 1 tablet (25 mg total) by mouth daily.   No facility-administered medications prior to visit.    Review of Systems  Constitutional: Negative.  Negative for chills, fever, malaise/fatigue and weight loss.  HENT: Negative.  Negative for sore throat.   Eyes: Negative.   Respiratory: Negative.  Negative for cough and shortness of breath.   Cardiovascular: Negative.  Negative for chest pain, palpitations and leg swelling.  Gastrointestinal: Negative.  Negative for abdominal pain, constipation, diarrhea, heartburn, nausea and vomiting.  Genitourinary: Negative.  Negative for dysuria and flank pain.  Musculoskeletal: Negative.  Negative for joint pain and myalgias.  Skin: Negative.   Neurological: Negative.  Negative for dizziness, tingling, tremors, sensory change and headaches.  Endo/Heme/Allergies: Negative.   Psychiatric/Behavioral: Negative.  Negative for depression and suicidal ideas. The patient is not nervous/anxious.        Objective:   BP (!) 146/68   Pulse 71   Ht 5\' 11"  (1.803 m)   Wt 190 lb 6.4 oz (86.4 kg)   SpO2 98%   BMI 26.56 kg/m   Vitals:   03/13/24 1022  BP: (!) 146/68  Pulse: 71  Height: 5\' 11"  (1.803 m)  Weight: 190 lb 6.4 oz (86.4 kg)  SpO2: 98%  BMI (Calculated): 26.57    Physical Exam Vitals and nursing note reviewed.  Constitutional:      Appearance: Normal appearance.  HENT:     Head: Normocephalic and atraumatic.     Nose: Nose normal.     Mouth/Throat:     Mouth: Mucous membranes are moist.     Pharynx: Oropharynx is clear.  Eyes:     Conjunctiva/sclera: Conjunctivae normal.     Pupils: Pupils are equal, round, and reactive to  light.  Cardiovascular:     Rate and Rhythm: Normal rate and regular rhythm.     Pulses: Normal pulses.     Heart sounds: Normal heart sounds. No murmur heard. Pulmonary:     Effort: Pulmonary effort is normal.     Breath sounds: Normal breath sounds. No wheezing.  Abdominal:     General: Bowel sounds are normal.     Palpations: Abdomen is soft.     Tenderness: There is no abdominal tenderness. There is no right CVA tenderness or left CVA tenderness.  Musculoskeletal:        General: Normal range of motion.     Cervical back: Normal range of motion.     Right lower leg: No edema.     Left lower leg: No edema.  Skin:    General: Skin is warm and dry.  Neurological:     General: No focal deficit present.     Mental Status: She is alert and oriented to person, place, and time.  Psychiatric:        Mood and Affect: Mood normal.        Behavior: Behavior normal.      No results found for any visits on 03/13/24.  Recent Results (from the past 2160 hours)  Protime-INR     Status: Abnormal   Collection Time: 12/22/23 10:48 AM  Result Value Ref Range   INR 1.1 0.9 - 1.2    Comment: Reference interval is for non-anticoagulated patients. Suggested INR therapeutic range for Vitamin K antagonist therapy:    Standard Dose (moderate intensity                   therapeutic range):       2.0 - 3.0    Higher intensity therapeutic range       2.5 - 3.5    Prothrombin  Time 12.2 (H) 9.1 - 12.0 sec  Hemoglobin A1c     Status: None   Collection Time: 12/22/23 10:49 AM  Result Value Ref Range   Hgb A1c MFr Bld 5.6 4.8 - 5.6 %    Comment:          Prediabetes: 5.7 - 6.4          Diabetes: >6.4          Glycemic control for adults with diabetes: <7.0    Est. average glucose Bld gHb Est-mCnc 114 mg/dL  TSH     Status: None   Collection Time: 12/22/23 10:49 AM  Result Value Ref Range   TSH 0.827 0.450 - 4.500 uIU/mL  CMP14+EGFR     Status: Abnormal   Collection Time: 12/22/23 10:49 AM   Result Value Ref Range   Glucose 92 70 - 99 mg/dL   BUN 15 8 - 27 mg/dL   Creatinine, Ser 4.09 0.57 - 1.00 mg/dL   eGFR 68 >81 XB/JYN/8.29   BUN/Creatinine Ratio 18 12 - 28   Sodium 144 134 - 144 mmol/L  Potassium 4.0 3.5 - 5.2 mmol/L   Chloride 106 96 - 106 mmol/L   CO2 28 20 - 29 mmol/L   Calcium  9.5 8.7 - 10.3 mg/dL   Total Protein 5.8 (L) 6.0 - 8.5 g/dL   Albumin 4.0 3.7 - 4.7 g/dL   Globulin, Total 1.8 1.5 - 4.5 g/dL   Bilirubin Total 0.5 0.0 - 1.2 mg/dL   Alkaline Phosphatase 66 44 - 121 IU/L   AST 15 0 - 40 IU/L   ALT 8 0 - 32 IU/L  Lipid panel     Status: None   Collection Time: 12/22/23 10:49 AM  Result Value Ref Range   Cholesterol, Total 131 100 - 199 mg/dL   Triglycerides 161 0 - 149 mg/dL   HDL 58 >09 mg/dL   VLDL Cholesterol Cal 20 5 - 40 mg/dL   LDL Chol Calc (NIH) 53 0 - 99 mg/dL   Chol/HDL Ratio 2.3 0.0 - 4.4 ratio    Comment:                                   T. Chol/HDL Ratio                                             Men  Women                               1/2 Avg.Risk  3.4    3.3                                   Avg.Risk  5.0    4.4                                2X Avg.Risk  9.6    7.1                                3X Avg.Risk 23.4   11.0   Culture, blood (routine x 2)     Status: Abnormal   Collection Time: 01/25/24  1:10 AM   Specimen: BLOOD  Result Value Ref Range   Specimen Description      BLOOD LEFT ANTECUBITAL Performed at [facility], [address] [place], [place] [address]    Special Requests      BOTTLES DRAWN AEROBIC AND ANAEROBIC Blood Culture results may not be optimal due to an inadequate volume of blood received in culture bottles Performed at [facility], [address] [place], [place] [address]    Culture  Setup Time      GRAM POSITIVE COCCI IN BOTH AEROBIC AND ANAEROBIC BOTTLES CRITICAL VALUE NOTED.  VALUE IS CONSISTENT WITH PREVIOUSLY REPORTED AND CALLED VALUE. Performed at [facility],  [address] [place], [place] [address]    Culture (A)     STREPTOCOCCUS PNEUMONIAE SUSCEPTIBILITIES PERFORMED ON PREVIOUS CULTURE WITHIN THE LAST 5 DAYS. Performed at [facility], [address] [place], [place] [address]    Report Status 01/28/2024 FINAL   CBC with  Differential     Status: Abnormal   Collection Time: 01/25/24  1:12 AM  Result Value Ref Range   WBC 10.9 (H) 4.0 - 10.5 K/uL   RBC 3.90 3.87 - 5.11 MIL/uL   Hemoglobin 11.3 (L) 12.0 - 15.0 g/dL   HCT 16.1 (L) 09.6 - 04.5 %   MCV 91.0 80.0 - 100.0 fL   MCH 29.0 26.0 - 34.0 pg   MCHC 31.8 30.0 - 36.0 g/dL   RDW 40.9 81.1 - 91.4 %   Platelets 166 150 - 400 K/uL   nRBC 0.0 0.0 - 0.2 %   Neutrophils Relative % 77 %   Neutro Abs 8.5 (H) 1.7 - 7.7 K/uL   Lymphocytes Relative 18 %   Lymphs Abs 1.9 0.7 - 4.0 K/uL   Monocytes Relative 4 %   Monocytes Absolute 0.4 0.1 - 1.0 K/uL   Eosinophils Relative 1 %   Eosinophils Absolute 0.1 0.0 - 0.5 K/uL   Basophils Relative 0 %   Basophils Absolute 0.0 0.0 - 0.1 K/uL   Immature Granulocytes 0 %   Abs Immature Granulocytes 0.04 0.00 - 0.07 K/uL    Comment: Performed at [facility], [address] [place], [place] [address]  Comprehensive metabolic panel     Status: Abnormal   Collection Time: 01/25/24  1:12 AM  Result Value Ref Range   Sodium 136 135 - 145 mmol/L   Potassium 3.6 3.5 - 5.1 mmol/L   Chloride 101 98 - 111 mmol/L   CO2 27 22 - 32 mmol/L   Glucose, Bld 169 (H) 70 - 99 mg/dL    Comment: Glucose reference range applies only to samples taken after fasting for at least 8 hours.   BUN 19 8 - 23 mg/dL   Creatinine, Ser 6.21 0.44 - 1.00 mg/dL   Calcium  9.2 8.9 - 10.3 mg/dL   Total Protein 6.3 (L) 6.5 - 8.1 g/dL   Albumin 3.7 3.5 - 5.0 g/dL   AST 21 15 - 41 U/L   ALT 11 0 - 44 U/L   Alkaline Phosphatase 60 38 - 126 U/L   Total Bilirubin 0.6 0.0 - 1.2 mg/dL   GFR, Estimated >30 >86 mL/min    Comment: (NOTE) Calculated using the CKD-EPI  Creatinine Equation (2021)    Anion gap 8 5 - 15    Comment: Performed at [facility], [address] [place], [place] [address]  Brain natriuretic peptide     Status: Abnormal   Collection Time: 01/25/24  1:12 AM  Result Value Ref Range   B Natriuretic Peptide 522.6 (H) 0.0 - 100.0 pg/mL    Comment: Performed at [facility], [address] [place], [place] [address]  Troponin I (High Sensitivity)     Status: Abnormal   Collection Time: 01/25/24  1:12 AM  Result Value Ref Range   Troponin I (High Sensitivity) 27 (H) <18 ng/L    Comment: (NOTE) Elevated high sensitivity troponin I (hsTnI) values and significant  changes across serial measurements may suggest ACS but many other  chronic and acute conditions are known to elevate hsTnI results.  Refer to the "Links" section for chest pain algorithms and additional  guidance. Performed at [facility], [address] [place], [place] [address]   Lactic acid, plasma     Status: None   Collection Time: 01/25/24  1:13 AM  Result Value Ref Range   Lactic Acid, Venous 1.6 0.5 - 1.9 mmol/L  Comment: Performed at [facility], [address] [place], [place] [address]  Culture, blood (routine x 2)     Status: Abnormal   Collection Time: 01/25/24  1:15 AM   Specimen: BLOOD  Result Value Ref Range   Specimen Description      BLOOD RIGHT ANTECUBITAL Performed at [facility], [address] [place], [place] [address]    Special Requests      BOTTLES DRAWN AEROBIC AND ANAEROBIC Blood Culture results may not be optimal due to an inadequate volume of blood received in culture bottles Performed at [facility], [address] [place], [place] [address]    Culture  Setup Time      GRAM POSITIVE COCCI IN BOTH AEROBIC AND ANAEROBIC BOTTLES Organism ID to follow CRITICAL RESULT CALLED TO, READ BACK BY AND VERIFIED WITH: TREY GREENWOOD 1616 01/25/24 MU Performed at [facility], [address] [place], [place] [address]    Culture STREPTOCOCCUS PNEUMONIAE (A)    Report Status 01/28/2024 FINAL    Organism ID, Bacteria STREPTOCOCCUS PNEUMONIAE       Susceptibility   Streptococcus pneumoniae - MIC*    ERYTHROMYCIN <=0.12 SENSITIVE Sensitive     LEVOFLOXACIN 1 SENSITIVE Sensitive     VANCOMYCIN  0.5 SENSITIVE Sensitive     PENICILLIN (meningitis) <=0.06 SENSITIVE Sensitive     PENO - penicillin <=0.06      PENICILLIN (non-meningitis) <=0.06 SENSITIVE Sensitive     PENICILLIN (oral) <=0.06 SENSITIVE Sensitive     CEFTRIAXONE  (non-meningitis) <=0.12 SENSITIVE Sensitive     CEFTRIAXONE  (meningitis) <=0.12 SENSITIVE Sensitive     * STREPTOCOCCUS PNEUMONIAE  Blood Culture ID Panel (Reflexed)     Status: Abnormal   Collection Time: 01/25/24  1:15 AM  Result Value Ref Range   Enterococcus faecalis NOT DETECTED NOT DETECTED   Enterococcus Faecium NOT DETECTED NOT DETECTED   Listeria monocytogenes NOT DETECTED NOT DETECTED   Staphylococcus species NOT DETECTED NOT DETECTED   Staphylococcus aureus (BCID) NOT DETECTED NOT DETECTED   Staphylococcus epidermidis NOT DETECTED NOT DETECTED   Staphylococcus lugdunensis NOT DETECTED NOT DETECTED   Streptococcus species DETECTED (A) NOT DETECTED    Comment: CRITICAL RESULT CALLED TO, READ BACK BY AND VERIFIED WITH: TREY GREENWOOD 1616 01/25/24 MU    Streptococcus agalactiae NOT DETECTED NOT DETECTED   Streptococcus pneumoniae DETECTED (A) NOT DETECTED    Comment: CRITICAL RESULT CALLED TO, READ BACK BY AND VERIFIED WITH: TREY GREENWOOD 1616 01/25/24 MU    Streptococcus pyogenes NOT DETECTED NOT DETECTED   A.calcoaceticus-baumannii NOT DETECTED NOT DETECTED   Bacteroides fragilis NOT DETECTED NOT DETECTED   Enterobacterales NOT DETECTED NOT DETECTED   Enterobacter cloacae complex NOT DETECTED NOT DETECTED   Escherichia coli NOT DETECTED NOT DETECTED   Klebsiella aerogenes NOT DETECTED NOT DETECTED   Klebsiella oxytoca  NOT DETECTED NOT DETECTED   Klebsiella pneumoniae NOT DETECTED NOT DETECTED   Proteus species NOT DETECTED NOT DETECTED   Salmonella species NOT DETECTED NOT DETECTED   Serratia marcescens NOT DETECTED NOT DETECTED   Haemophilus influenzae NOT DETECTED NOT DETECTED   Neisseria meningitidis NOT DETECTED NOT DETECTED   Pseudomonas aeruginosa NOT DETECTED NOT DETECTED   Stenotrophomonas maltophilia NOT DETECTED NOT DETECTED   Candida albicans NOT DETECTED NOT DETECTED   Candida auris NOT DETECTED NOT DETECTED   Candida glabrata NOT DETECTED NOT DETECTED   Candida krusei NOT DETECTED NOT DETECTED   Candida parapsilosis NOT DETECTED NOT DETECTED  Candida tropicalis NOT DETECTED NOT DETECTED   Cryptococcus neoformans/gattii NOT DETECTED NOT DETECTED    Comment: Performed at [facility], [address] [place], [place] [address]  Urinalysis, w/ Reflex to Culture (Infection Suspected) -Urine, Catheterized     Status: Abnormal   Collection Time: 01/25/24  1:24 AM  Result Value Ref Range   Specimen Source URINE, CATHETERIZED    Color, Urine YELLOW (A) YELLOW   APPearance CLEAR (A) CLEAR   Specific Gravity, Urine 1.017 1.005 - 1.030   pH 6.0 5.0 - 8.0   Glucose, UA NEGATIVE NEGATIVE mg/dL   Hgb urine dipstick NEGATIVE NEGATIVE   Bilirubin Urine NEGATIVE NEGATIVE   Ketones, ur NEGATIVE NEGATIVE mg/dL   Protein, ur 30 (A) NEGATIVE mg/dL   Nitrite NEGATIVE NEGATIVE   Leukocytes,Ua NEGATIVE NEGATIVE   RBC / HPF 0-5 0 - 5 RBC/hpf   WBC, UA 0-5 0 - 5 WBC/hpf    Comment:        Reflex urine culture not performed if WBC <=10, OR if Squamous epithelial cells >5. If Squamous epithelial cells >5 suggest recollection.    Bacteria, UA NONE SEEN NONE SEEN   Squamous Epithelial / HPF 0-5 0 - 5 /HPF   Mucus PRESENT     Comment: Performed at [facility], [address] [place], [place] [address]  Lactic acid, plasma     Status: None   Collection Time: 01/25/24  3:17 AM   Result Value Ref Range   Lactic Acid, Venous 1.3 0.5 - 1.9 mmol/L    Comment: Performed at [facility], [address] [place], [place] [address]  Troponin I (High Sensitivity)     Status: Abnormal   Collection Time: 01/25/24  3:17 AM  Result Value Ref Range   Troponin I (High Sensitivity) 433 (HH) <18 ng/L    Comment: CRITICAL RESULT CALLED TO, READ BACK BY AND VERIFIED WITH  KIMBERLEY EVANS AT 7425 01/25/24 JG (NOTE) Elevated high sensitivity troponin I (hsTnI) values and significant  changes across serial measurements may suggest ACS but many other  chronic and acute conditions are known to elevate hsTnI results.  Refer to the "Links" section for chest pain algorithms and additional  guidance. Performed at [facility], [address] [place], [place] [address]   Resp panel by RT-PCR (RSV, Flu A&B, Covid) Anterior Nasal Swab     Status: None   Collection Time: 01/25/24  3:48 AM   Specimen: Anterior Nasal Swab  Result Value Ref Range   SARS Coronavirus 2 by RT PCR NEGATIVE NEGATIVE    Comment: (NOTE) SARS-CoV-2 target nucleic acids are NOT DETECTED.  The SARS-CoV-2 RNA is generally detectable in upper respiratory specimens during the acute phase of infection. The lowest concentration of SARS-CoV-2 viral copies this assay can detect is 138 copies/mL. A negative result does not preclude SARS-Cov-2 infection and should not be used as the sole basis for treatment or other patient management decisions. A negative result may occur with  improper specimen collection/handling, submission of specimen other than nasopharyngeal swab, presence of viral mutation(s) within the areas targeted by this assay, and inadequate number of viral copies(<138 copies/mL). A negative result must be combined with clinical observations, patient history, and epidemiological information. The expected result is Negative.  Fact Sheet for Patients:   BloggerCourse.com  Fact Sheet for Healthcare Providers:  SeriousBroker.it  This test is no t yet approved or cleared by the [place]  FDA and  has been authorized  for detection and/or diagnosis of SARS-CoV-2 by FDA under an Emergency Use Authorization (EUA). This EUA will remain  in effect (meaning this test can be used) for the duration of the COVID-19 declaration under Section 564(b)(1) of the Act, 21 U.S.C.section 360bbb-3(b)(1), unless the authorization is terminated  or revoked sooner.       Influenza A by PCR NEGATIVE NEGATIVE   Influenza B by PCR NEGATIVE NEGATIVE    Comment: (NOTE) The Xpert Xpress SARS-CoV-2/FLU/RSV plus assay is intended as an aid in the diagnosis of influenza from Nasopharyngeal swab specimens and should not be used as a sole basis for treatment. Nasal washings and aspirates are unacceptable for Xpert Xpress SARS-CoV-2/FLU/RSV testing.  Fact Sheet for Patients: BloggerCourse.com  Fact Sheet for Healthcare Providers: SeriousBroker.it  This test is not yet approved or cleared by the [place]  FDA and has been authorized for detection and/or diagnosis of SARS-CoV-2 by FDA under an Emergency Use Authorization (EUA). This EUA will remain in effect (meaning this test can be used) for the duration of the COVID-19 declaration under Section 564(b)(1) of the Act, 21 U.S.C. section 360bbb-3(b)(1), unless the authorization is terminated or revoked.     Resp Syncytial Virus by PCR NEGATIVE NEGATIVE    Comment: (NOTE) Fact Sheet for Patients: BloggerCourse.com  Fact Sheet for Healthcare Providers: SeriousBroker.it  This test is not yet approved or cleared by the [place]  FDA and has been authorized for detection and/or diagnosis of SARS-CoV-2 by FDA under an Emergency Use Authorization (EUA).  This EUA will remain in effect (meaning this test can be used) for the duration of the COVID-19 declaration under Section 564(b)(1) of the Act, 21 U.S.C. section 360bbb-3(b)(1), unless the authorization is terminated or revoked.  Performed at [facility], [address] [place], [place] [address]   APTT     Status: None   Collection Time: 01/25/24  4:40 AM  Result Value Ref Range   aPTT 35 24 - 36 seconds    Comment: Performed at [facility], [address] [place], [place] [address]  Protime-INR     Status: Abnormal   Collection Time: 01/25/24  4:40 AM  Result Value Ref Range   Prothrombin  Time 16.2 (H) 11.4 - 15.2 seconds   INR 1.3 (H) 0.8 - 1.2    Comment: (NOTE) INR goal varies based on device and disease states. Performed at [facility], [address] [place], [place] [address]   Heparin  level (unfractionated)     Status: Abnormal   Collection Time: 01/25/24  4:40 AM  Result Value Ref Range   Heparin  Unfractionated >1.10 (H) 0.30 - 0.70 IU/mL    Comment: (NOTE) The clinical reportable range upper limit is being lowered to >1.10 to align with the FDA approved guidance for the current laboratory assay.  If heparin  results are below expected values, and patient dosage has  been confirmed, suggest follow up testing of antithrombin III levels. Performed at [facility], [address] [place], [place] [address]   CBG monitoring, ED     Status: Abnormal   Collection Time: 01/25/24  7:27 AM  Result Value Ref Range   Glucose-Capillary 102 (H) 70 - 99 mg/dL    Comment: Glucose reference range applies only to samples taken after fasting for at least 8 hours.  CBG monitoring, ED     Status: Abnormal   Collection Time: 01/25/24 11:56 AM  Result Value Ref Range   Glucose-Capillary 108 (H) 70 - 99  mg/dL    Comment: Glucose reference range applies only to samples taken after fasting for at least 8 hours.  APTT     Status: Abnormal    Collection Time: 01/25/24 12:49 PM  Result Value Ref Range   aPTT >200 (HH) 24 - 36 seconds    Comment:        IF BASELINE aPTT IS ELEVATED, SUGGEST PATIENT RISK ASSESSMENT BE USED TO DETERMINE APPROPRIATE ANTICOAGULANT THERAPY. REPEATED TO VERIFY CRITICAL RESULT CALLED TO, READ BACK BY AND VERIFIED WITH: Tiburcio Fly, RN [phone] 01/25/24 GM Performed at [facility], [address] [place], [place] [address]   CBG monitoring, ED     Status: None   Collection Time: 01/25/24  4:24 PM  Result Value Ref Range   Glucose-Capillary 96 70 - 99 mg/dL    Comment: Glucose reference range applies only to samples taken after fasting for at least 8 hours.  CBG monitoring, ED     Status: None   Collection Time: 01/25/24  9:43 PM  Result Value Ref Range   Glucose-Capillary 77 70 - 99 mg/dL    Comment: Glucose reference range applies only to samples taken after fasting for at least 8 hours.  ECHOCARDIOGRAM COMPLETE     Status: None   Collection Time: 01/25/24 10:26 PM  Result Value Ref Range   Weight 3,326.3 oz   Height 71 in   BP 115/50 mmHg   S' Lateral 3.50 cm   Area-P 1/2 5.46 cm2   Est EF 60 - 65%   APTT     Status: Abnormal   Collection Time: 01/25/24 11:44 PM  Result Value Ref Range   aPTT 180 (HH) 24 - 36 seconds    Comment:        IF BASELINE aPTT IS ELEVATED, SUGGEST PATIENT RISK ASSESSMENT BE USED TO DETERMINE APPROPRIATE ANTICOAGULANT THERAPY. REPEATED TO VERIFY CRITICAL RESULT CALLED TO, READ BACK BY AND VERIFIED WITH:  KAITLYN OBERSKI AT 0119 01/26/24 JG Performed at [facility], [address] [place], [place] [address]   Basic metabolic panel with GFR     Status: Abnormal   Collection Time: 01/26/24  4:14 AM  Result Value Ref Range   Sodium 140 135 - 145 mmol/L   Potassium 4.0 3.5 - 5.1 mmol/L   Chloride 104 98 - 111 mmol/L   CO2 25 22 - 32 mmol/L   Glucose, Bld 89 70 - 99 mg/dL    Comment: Glucose reference range applies only to samples taken  after fasting for at least 8 hours.   BUN 28 (H) 8 - 23 mg/dL   Creatinine, Ser 4.40 (H) 0.44 - 1.00 mg/dL   Calcium  8.3 (L) 8.9 - 10.3 mg/dL   GFR, Estimated 53 (L) >60 mL/min    Comment: (NOTE) Calculated using the CKD-EPI Creatinine Equation (2021)    Anion gap 11 5 - 15    Comment: Performed at [facility], [address] [place], [place] [address]  CBC     Status: Abnormal   Collection Time: 01/26/24  4:14 AM  Result Value Ref Range   WBC 18.4 (H) 4.0 - 10.5 K/uL   RBC 3.43 (L) 3.87 - 5.11 MIL/uL   Hemoglobin 10.0 (L) 12.0 - 15.0 g/dL   HCT 53.6 (L) 64.4 - 03.4 %   MCV 95.3 80.0 - 100.0 fL   MCH 29.2 26.0 - 34.0 pg   MCHC 30.6 30.0 - 36.0 g/dL   RDW 74.2 59.5 -  15.5 %   Platelets 154 150 - 400 K/uL   nRBC 0.0 0.0 - 0.2 %    Comment: Performed at [facility], [address] [place], [place] [address]  CBG monitoring, ED     Status: None   Collection Time: 01/26/24  7:35 AM  Result Value Ref Range   Glucose-Capillary 79 70 - 99 mg/dL    Comment: Glucose reference range applies only to samples taken after fasting for at least 8 hours.  CBG monitoring, ED     Status: None   Collection Time: 01/26/24 11:38 AM  Result Value Ref Range   Glucose-Capillary 92 70 - 99 mg/dL    Comment: Glucose reference range applies only to samples taken after fasting for at least 8 hours.  Heparin  level (unfractionated)     Status: Abnormal   Collection Time: 01/26/24  1:29 PM  Result Value Ref Range   Heparin  Unfractionated 1.01 (H) 0.30 - 0.70 IU/mL    Comment: (NOTE) The clinical reportable range upper limit is being lowered to >1.10 to align with the FDA approved guidance for the current laboratory assay.  If heparin  results are below expected values, and patient dosage has  been confirmed, suggest follow up testing of antithrombin III levels. Performed at [facility], [address] [place], [place] [address]   APTT     Status: Abnormal    Collection Time: 01/26/24  1:29 PM  Result Value Ref Range   aPTT 44 (H) 24 - 36 seconds    Comment:        IF BASELINE aPTT IS ELEVATED, SUGGEST PATIENT RISK ASSESSMENT BE USED TO DETERMINE APPROPRIATE ANTICOAGULANT THERAPY. Performed at [facility], [address] [place], [place] [address]   Glucose, capillary     Status: None   Collection Time: 01/26/24  5:27 PM  Result Value Ref Range   Glucose-Capillary 74 70 - 99 mg/dL    Comment: Glucose reference range applies only to samples taken after fasting for at least 8 hours.  Glucose, capillary     Status: None   Collection Time: 01/26/24  9:05 PM  Result Value Ref Range   Glucose-Capillary 73 70 - 99 mg/dL    Comment: Glucose reference range applies only to samples taken after fasting for at least 8 hours.  APTT     Status: Abnormal   Collection Time: 01/26/24 11:21 PM  Result Value Ref Range   aPTT 83 (H) 24 - 36 seconds    Comment:        IF BASELINE aPTT IS ELEVATED, SUGGEST PATIENT RISK ASSESSMENT BE USED TO DETERMINE APPROPRIATE ANTICOAGULANT THERAPY. Performed at [facility], [address] [place], [place] [address]   Basic metabolic panel with GFR     Status: Abnormal   Collection Time: 01/27/24  6:18 AM  Result Value Ref Range   Sodium 139 135 - 145 mmol/L   Potassium 3.7 3.5 - 5.1 mmol/L   Chloride 108 98 - 111 mmol/L   CO2 25 22 - 32 mmol/L   Glucose, Bld 89 70 - 99 mg/dL    Comment: Glucose reference range applies only to samples taken after fasting for at least 8 hours.   BUN 23 8 - 23 mg/dL   Creatinine, Ser 1.69 0.44 - 1.00 mg/dL   Calcium  8.4 (L) 8.9 - 10.3 mg/dL   GFR, Estimated >67 >89 mL/min    Comment: (NOTE) Calculated using the CKD-EPI Creatinine Equation (2021)  Anion gap 6 5 - 15    Comment: Performed at [facility], [address] [place], [place] [address]  CBC     Status: Abnormal   Collection Time: 01/27/24  6:18 AM  Result Value Ref Range   WBC  14.4 (H) 4.0 - 10.5 K/uL   RBC 3.26 (L) 3.87 - 5.11 MIL/uL   Hemoglobin 9.5 (L) 12.0 - 15.0 g/dL   HCT 08.6 (L) 57.8 - 46.9 %   MCV 92.0 80.0 - 100.0 fL   MCH 29.1 26.0 - 34.0 pg   MCHC 31.7 30.0 - 36.0 g/dL   RDW 62.9 52.8 - 41.3 %   Platelets 126 (L) 150 - 400 K/uL   nRBC 0.0 0.0 - 0.2 %    Comment: Performed at [facility], [address] [place], [place] [address]  APTT     Status: Abnormal   Collection Time: 01/27/24  6:18 AM  Result Value Ref Range   aPTT 73 (H) 24 - 36 seconds    Comment:        IF BASELINE aPTT IS ELEVATED, SUGGEST PATIENT RISK ASSESSMENT BE USED TO DETERMINE APPROPRIATE ANTICOAGULANT THERAPY. Performed at [facility], [address] [place], [place] [address]   Heparin  level (unfractionated)     Status: None   Collection Time: 01/27/24  6:18 AM  Result Value Ref Range   Heparin  Unfractionated 0.48 0.30 - 0.70 IU/mL    Comment: (NOTE) The clinical reportable range upper limit is being lowered to >1.10 to align with the FDA approved guidance for the current laboratory assay.  If heparin  results are below expected values, and patient dosage has  been confirmed, suggest follow up testing of antithrombin III levels. Performed at [facility], [address] [place], [place] [address]   Glucose, capillary     Status: None   Collection Time: 01/27/24  8:29 AM  Result Value Ref Range   Glucose-Capillary 82 70 - 99 mg/dL    Comment: Glucose reference range applies only to samples taken after fasting for at least 8 hours.  CBC with Diff     Status: Abnormal   Collection Time: 02/03/24 11:36 AM  Result Value Ref Range   WBC 8.6 3.4 - 10.8 x10E3/uL   RBC 3.40 (L) 3.77 - 5.28 x10E6/uL   Hemoglobin 9.5 (L) 11.1 - 15.9 g/dL   Hematocrit 03.4 (L) 74.2 - 46.6 %   MCV 92 79 - 97 fL   MCH 27.9 26.6 - 33.0 pg   MCHC 30.4 (L) 31.5 - 35.7 g/dL   RDW 59.5 63.8 - 75.6 %   Platelets 279 150 - 450 x10E3/uL   Neutrophils 67 Not  Estab. %   Lymphs 21 Not Estab. %   Monocytes 4 Not Estab. %   Eos 6 Not Estab. %   Basos 1 Not Estab. %   Neutrophils Absolute 5.8 1.4 - 7.0 x10E3/uL   Lymphocytes Absolute 1.8 0.7 - 3.1 x10E3/uL   Monocytes Absolute 0.4 0.1 - 0.9 x10E3/uL   EOS (ABSOLUTE) 0.5 (H) 0.0 - 0.4 x10E3/uL   Basophils Absolute 0.1 0.0 - 0.2 x10E3/uL   Immature Granulocytes 1 Not Estab. %   Immature Grans (Abs) 0.0 0.0 - 0.1 x10E3/uL  CMP14+EGFR     Status: Abnormal   Collection Time: 02/03/24 11:36 AM  Result Value Ref Range   Glucose 83 70 - 99 mg/dL   BUN 8 8 - 27 mg/dL   Creatinine, Ser 4.33 0.57 -  1.00 mg/dL   eGFR 86 >16 XW/RUE/4.54   BUN/Creatinine Ratio 12 12 - 28   Sodium 145 (H) 134 - 144 mmol/L   Potassium 4.1 3.5 - 5.2 mmol/L   Chloride 108 (H) 96 - 106 mmol/L   CO2 23 20 - 29 mmol/L   Calcium  8.9 8.7 - 10.3 mg/dL   Total Protein 5.4 (L) 6.0 - 8.5 g/dL   Albumin 3.4 (L) 3.7 - 4.7 g/dL   Globulin, Total 2.0 1.5 - 4.5 g/dL   Bilirubin Total 0.3 0.0 - 1.2 mg/dL   Alkaline Phosphatase 75 44 - 121 IU/L   AST 14 0 - 40 IU/L   ALT 11 0 - 32 IU/L  CBC with Differential/Platelet     Status: Abnormal   Collection Time: 02/13/24  2:03 AM  Result Value Ref Range   WBC 5.9 4.0 - 10.5 K/uL   RBC 3.48 (L) 3.87 - 5.11 MIL/uL   Hemoglobin 9.8 (L) 12.0 - 15.0 g/dL   HCT 09.8 (L) 11.9 - 14.7 %   MCV 93.1 80.0 - 100.0 fL   MCH 28.2 26.0 - 34.0 pg   MCHC 30.2 30.0 - 36.0 g/dL   RDW 82.9 56.2 - 13.0 %   Platelets 293 150 - 400 K/uL   nRBC 0.0 0.0 - 0.2 %   Neutrophils Relative % 38 %   Neutro Abs 2.3 1.7 - 7.7 K/uL   Lymphocytes Relative 44 %   Lymphs Abs 2.6 0.7 - 4.0 K/uL   Monocytes Relative 6 %   Monocytes Absolute 0.4 0.1 - 1.0 K/uL   Eosinophils Relative 11 %   Eosinophils Absolute 0.6 (H) 0.0 - 0.5 K/uL   Basophils Relative 1 %   Basophils Absolute 0.1 0.0 - 0.1 K/uL   Immature Granulocytes 0 %   Abs Immature Granulocytes 0.01 0.00 - 0.07 K/uL    Comment: Performed at [facility], [address] [place], [place] [address]  Comprehensive metabolic panel     Status: Abnormal   Collection Time: 02/13/24  2:03 AM  Result Value Ref Range   Sodium 141 135 - 145 mmol/L   Potassium 4.4 3.5 - 5.1 mmol/L   Chloride 106 98 - 111 mmol/L   CO2 29 22 - 32 mmol/L   Glucose, Bld 98 70 - 99 mg/dL    Comment: Glucose reference range applies only to samples taken after fasting for at least 8 hours.   BUN 12 8 - 23 mg/dL   Creatinine, Ser 4.69 0.44 - 1.00 mg/dL   Calcium  9.2 8.9 - 10.3 mg/dL   Total Protein 6.0 (L) 6.5 - 8.1 g/dL   Albumin 3.1 (L) 3.5 - 5.0 g/dL   AST 37 15 - 41 U/L   ALT 21 0 - 44 U/L   Alkaline Phosphatase 63 38 - 126 U/L   Total Bilirubin 0.7 0.0 - 1.2 mg/dL   GFR, Estimated >62 >95 mL/min    Comment: (NOTE) Calculated using the CKD-EPI Creatinine Equation (2021)    Anion gap 6 5 - 15    Comment: Performed at [facility], [address] [place], [place] [address]  Lipase, blood     Status: None   Collection Time: 02/13/24  2:03 AM  Result Value Ref Range   Lipase 28 11 - 51 U/L    Comment: Performed at [facility], [address], [place], [place] [address]  Troponin I (High Sensitivity)     Status: Abnormal  Collection Time: 02/13/24  2:03 AM  Result Value Ref Range   Troponin I (High Sensitivity) 18 (H) <18 ng/L    Comment: (NOTE) Elevated high sensitivity troponin I (hsTnI) values and significant  changes across serial measurements may suggest ACS but many other  chronic and acute conditions are known to elevate hsTnI results.  Refer to the "Links" section for chest pain algorithms and additional  guidance. Performed at [facility], [address] [place], [place] [address]   Magnesium     Status: None   Collection Time: 02/13/24  2:03 AM  Result Value Ref Range   Magnesium 2.0 1.7 - 2.4 mg/dL    Comment: Performed at [facility], [address] [place], [place] [address]  Procalcitonin      Status: None   Collection Time: 02/13/24  2:03 AM  Result Value Ref Range   Procalcitonin <0.10 ng/mL    Comment:        Interpretation: PCT (Procalcitonin) <= 0.5 ng/mL: Systemic infection (sepsis) is not likely. Local bacterial infection is possible. (NOTE)       Sepsis PCT Algorithm           Lower Respiratory Tract                                      Infection PCT Algorithm    ----------------------------     ----------------------------         PCT < 0.25 ng/mL                PCT < 0.10 ng/mL          Strongly encourage             Strongly discourage   discontinuation of antibiotics    initiation of antibiotics    ----------------------------     -----------------------------       PCT 0.25 - 0.50 ng/mL            PCT 0.10 - 0.25 ng/mL               OR       >80% decrease in PCT            Discourage initiation of                                            antibiotics      Encourage discontinuation           of antibiotics    ----------------------------     -----------------------------         PCT >= 0.50 ng/mL              PCT 0.26 - 0.50 ng/mL               AND        <80% decrease in PCT             Encourage initiation of                                             antibiotics       Encourage continuation  of antibiotics    ----------------------------     -----------------------------        PCT >= 0.50 ng/mL                  PCT > 0.50 ng/mL               AND         increase in PCT                  Strongly encourage                                      initiation of antibiotics    Strongly encourage escalation           of antibiotics                                     -----------------------------                                           PCT <= 0.25 ng/mL                                                 OR                                        > 80% decrease in PCT                                      Discontinue / Do not initiate                                              antibiotics  Performed at [facility], [address] [place], [place] [address]   Brain natriuretic peptide     Status: Abnormal   Collection Time: 02/13/24  2:03 AM  Result Value Ref Range   B Natriuretic Peptide 730.3 (H) 0.0 - 100.0 pg/mL    Comment: Performed at [facility], [address] [place], [place] [address]  Blood culture (routine x 2)     Status: None   Collection Time: 02/13/24  3:56 AM   Specimen: BLOOD LEFT ARM  Result Value Ref Range   Specimen Description BLOOD LEFT ARM    Special Requests      BOTTLES DRAWN AEROBIC AND ANAEROBIC Blood Culture adequate volume   Culture      NO GROWTH 5 DAYS Performed at [facility], [address] [place], [place] [address]    Report Status 02/18/2024 FINAL   Blood culture (routine x 2)     Status: None   Collection Time: 02/13/24  3:56 AM   Specimen: BLOOD RIGHT ARM  Result Value Ref Range   Specimen Description BLOOD RIGHT  ARM    Special Requests      BOTTLES DRAWN AEROBIC AND ANAEROBIC Blood Culture results may not be optimal due to an inadequate volume of blood received in culture bottles   Culture      NO GROWTH 5 DAYS Performed at [facility], [address] [place], [place] [address]    Report Status 02/18/2024 FINAL   Legionella Pneumophila Serogp 1 Ur Ag     Status: None   Collection Time: 02/13/24  3:56 AM  Result Value Ref Range   L. pneumophila Serogp 1 Ur Ag Negative Negative    Comment: (NOTE) Presumptive negative for L. pneumophila serogroup 1 antigen in urine, suggesting no recent or current infection. Legionnaires' disease cannot be ruled out since other serogroups and species may also cause disease. Performed At: [facility] [address] [place], [place] [id number] Pearlean Botts MD UX:[id number]    Source of Sample URINE, RANDOM     Comment: Performed at [facility], [address]  [place], [place] [address]  Strep pneumoniae urinary antigen     Status: None   Collection Time: 02/13/24  3:56 AM  Result Value Ref Range   Strep Pneumo Urinary Antigen NEGATIVE NEGATIVE    Comment:        Infection due to S. pneumoniae cannot be absolutely ruled out since the antigen present may be below the detection limit of the test. Performed at [facility], [address], [place], [place] [address]   Urinalysis, Routine w reflex microscopic -Urine, Clean Catch     Status: Abnormal   Collection Time: 02/13/24  3:57 AM  Result Value Ref Range   Color, Urine YELLOW (A) YELLOW   APPearance CLEAR (A) CLEAR   Specific Gravity, Urine 1.008 1.005 - 1.030   pH 7.0 5.0 - 8.0   Glucose, UA NEGATIVE NEGATIVE mg/dL   Hgb urine dipstick NEGATIVE NEGATIVE   Bilirubin Urine NEGATIVE NEGATIVE   Ketones, ur NEGATIVE NEGATIVE mg/dL   Protein, ur NEGATIVE NEGATIVE mg/dL   Nitrite NEGATIVE NEGATIVE   Leukocytes,Ua TRACE (A) NEGATIVE   RBC / HPF 0-5 0 - 5 RBC/hpf   WBC, UA 0-5 0 - 5 WBC/hpf   Bacteria, UA NONE SEEN NONE SEEN   Squamous Epithelial / HPF 0-5 0 - 5 /HPF   Mucus PRESENT     Comment: Performed at [facility], [address] [place], [place] [address]  Troponin I (High Sensitivity)     Status: None   Collection Time: 02/13/24  3:57 AM  Result Value Ref Range   Troponin I (High Sensitivity) 17 <18 ng/L    Comment: (NOTE) Elevated high sensitivity troponin I (hsTnI) values and significant  changes across serial measurements may suggest ACS but many other  chronic and acute conditions are known to elevate hsTnI results.  Refer to the "Links" section for chest pain algorithms and additional  guidance. Performed at [facility], [address] [place], [place] [address]   Resp panel by RT-PCR (RSV, Flu A&B, Covid) Anterior Nasal Swab     Status: None   Collection Time: 02/13/24  3:58 AM   Specimen: Anterior Nasal Swab  Result Value Ref Range   SARS  Coronavirus 2 by RT PCR NEGATIVE NEGATIVE    Comment: (NOTE) SARS-CoV-2 target nucleic acids are NOT DETECTED.  The SARS-CoV-2 RNA is generally detectable in upper respiratory specimens during the acute phase of infection. The lowest concentration of SARS-CoV-2 viral copies  this assay can detect is 138 copies/mL. A negative result does not preclude SARS-Cov-2 infection and should not be used as the sole basis for treatment or other patient management decisions. A negative result may occur with  improper specimen collection/handling, submission of specimen other than nasopharyngeal swab, presence of viral mutation(s) within the areas targeted by this assay, and inadequate number of viral copies(<138 copies/mL). A negative result must be combined with clinical observations, patient history, and epidemiological information. The expected result is Negative.  Fact Sheet for Patients:  BloggerCourse.com  Fact Sheet for Healthcare Providers:  SeriousBroker.it  This test is no t yet approved or cleared by the [place]  FDA and  has been authorized for detection and/or diagnosis of SARS-CoV-2 by FDA under an Emergency Use Authorization (EUA). This EUA will remain  in effect (meaning this test can be used) for the duration of the COVID-19 declaration under Section 564(b)(1) of the Act, 21 U.S.C.section 360bbb-3(b)(1), unless the authorization is terminated  or revoked sooner.       Influenza A by PCR NEGATIVE NEGATIVE   Influenza B by PCR NEGATIVE NEGATIVE    Comment: (NOTE) The Xpert Xpress SARS-CoV-2/FLU/RSV plus assay is intended as an aid in the diagnosis of influenza from Nasopharyngeal swab specimens and should not be used as a sole basis for treatment. Nasal washings and aspirates are unacceptable for Xpert Xpress SARS-CoV-2/FLU/RSV testing.  Fact Sheet for Patients: BloggerCourse.com  Fact Sheet  for Healthcare Providers: SeriousBroker.it  This test is not yet approved or cleared by the [place]  FDA and has been authorized for detection and/or diagnosis of SARS-CoV-2 by FDA under an Emergency Use Authorization (EUA). This EUA will remain in effect (meaning this test can be used) for the duration of the COVID-19 declaration under Section 564(b)(1) of the Act, 21 U.S.C. section 360bbb-3(b)(1), unless the authorization is terminated or revoked.     Resp Syncytial Virus by PCR NEGATIVE NEGATIVE    Comment: (NOTE) Fact Sheet for Patients: BloggerCourse.com  Fact Sheet for Healthcare Providers: SeriousBroker.it  This test is not yet approved or cleared by the [place]  FDA and has been authorized for detection and/or diagnosis of SARS-CoV-2 by FDA under an Emergency Use Authorization (EUA). This EUA will remain in effect (meaning this test can be used) for the duration of the COVID-19 declaration under Section 564(b)(1) of the Act, 21 U.S.C. section 360bbb-3(b)(1), unless the authorization is terminated or revoked.  Performed at [facility], [address] [place], [place] [address]   MRSA Next Gen by PCR, Nasal     Status: None   Collection Time: 02/13/24 11:33 AM   Specimen: Nasal Mucosa; Nasal Swab  Result Value Ref Range   MRSA by PCR Next Gen NOT DETECTED NOT DETECTED    Comment: (NOTE) The GeneXpert MRSA Assay (FDA approved for NASAL specimens only), is one component of a comprehensive MRSA colonization surveillance program. It is not intended to diagnose MRSA infection nor to guide or monitor treatment for MRSA infections. Test performance is not FDA approved in patients less than 35 years old. Performed at [facility], [address] [place], [place] [address]   Respiratory (~20 pathogens) panel by PCR     Status: None   Collection Time: 02/13/24  4:08 PM    Specimen: Nasopharyngeal Swab; Respiratory  Result Value Ref Range   Adenovirus NOT DETECTED NOT DETECTED   Coronavirus 229E NOT DETECTED NOT DETECTED    Comment: (NOTE) The Coronavirus on the Respiratory  Panel, DOES NOT test for the novel  Coronavirus (2019 nCoV)    Coronavirus HKU1 NOT DETECTED NOT DETECTED   Coronavirus NL63 NOT DETECTED NOT DETECTED   Coronavirus OC43 NOT DETECTED NOT DETECTED   Metapneumovirus NOT DETECTED NOT DETECTED   Rhinovirus / Enterovirus NOT DETECTED NOT DETECTED   Influenza A NOT DETECTED NOT DETECTED   Influenza B NOT DETECTED NOT DETECTED   Parainfluenza Virus 1 NOT DETECTED NOT DETECTED   Parainfluenza Virus 2 NOT DETECTED NOT DETECTED   Parainfluenza Virus 3 NOT DETECTED NOT DETECTED   Parainfluenza Virus 4 NOT DETECTED NOT DETECTED   Respiratory Syncytial Virus NOT DETECTED NOT DETECTED   Bordetella pertussis NOT DETECTED NOT DETECTED   Bordetella Parapertussis NOT DETECTED NOT DETECTED   Chlamydophila pneumoniae NOT DETECTED NOT DETECTED   Mycoplasma pneumoniae NOT DETECTED NOT DETECTED    Comment: Performed at [facility], [address] [place], [place] [address]  CBC     Status: Abnormal   Collection Time: 02/14/24  5:01 AM  Result Value Ref Range   WBC 5.8 4.0 - 10.5 K/uL   RBC 3.37 (L) 3.87 - 5.11 MIL/uL   Hemoglobin 9.6 (L) 12.0 - 15.0 g/dL   HCT 57.8 (L) 46.9 - 62.9 %   MCV 91.1 80.0 - 100.0 fL   MCH 28.5 26.0 - 34.0 pg   MCHC 31.3 30.0 - 36.0 g/dL   RDW 52.8 41.3 - 24.4 %   Platelets 262 150 - 400 K/uL   nRBC 0.0 0.0 - 0.2 %    Comment: Performed at [facility], [address] [place], [place] [address]  Basic metabolic panel     Status: None   Collection Time: 02/14/24  5:01 AM  Result Value Ref Range   Sodium 141 135 - 145 mmol/L   Potassium 4.1 3.5 - 5.1 mmol/L   Chloride 105 98 - 111 mmol/L   CO2 28 22 - 32 mmol/L   Glucose, Bld 88 70 - 99 mg/dL    Comment: Glucose reference range applies only to  samples taken after fasting for at least 8 hours.   BUN 15 8 - 23 mg/dL   Creatinine, Ser 2.53 0.44 - 1.00 mg/dL   Calcium  9.0 8.9 - 10.3 mg/dL   GFR, Estimated >66 >44 mL/min    Comment: (NOTE) Calculated using the CKD-EPI Creatinine Equation (2021)    Anion gap 8 5 - 15    Comment: Performed at [facility], [address] [place], [place] [address]  Basic metabolic panel with GFR     Status: Abnormal   Collection Time: 02/15/24  3:29 AM  Result Value Ref Range   Sodium 139 135 - 145 mmol/L   Potassium 3.8 3.5 - 5.1 mmol/L   Chloride 105 98 - 111 mmol/L   CO2 28 22 - 32 mmol/L   Glucose, Bld 107 (H) 70 - 99 mg/dL    Comment: Glucose reference range applies only to samples taken after fasting for at least 8 hours.   BUN 23 8 - 23 mg/dL   Creatinine, Ser 2.59 0.44 - 1.00 mg/dL   Calcium  9.1 8.9 - 10.3 mg/dL   GFR, Estimated 59 (L) >60 mL/min    Comment: (NOTE) Calculated using the CKD-EPI Creatinine Equation (2021)    Anion gap 6 5 - 15    Comment: Performed at [facility], [address] [place], [place] [address]  CBC     Status: Abnormal  Collection Time: 02/15/24  3:29 AM  Result Value Ref Range   WBC 5.9 4.0 - 10.5 K/uL   RBC 3.40 (L) 3.87 - 5.11 MIL/uL   Hemoglobin 9.5 (L) 12.0 - 15.0 g/dL   HCT 16.1 (L) 09.6 - 04.5 %   MCV 90.0 80.0 - 100.0 fL   MCH 27.9 26.0 - 34.0 pg   MCHC 31.0 30.0 - 36.0 g/dL   RDW 40.9 81.1 - 91.4 %   Platelets 245 150 - 400 K/uL   nRBC 0.0 0.0 - 0.2 %    Comment: Performed at [facility], [address] [place], [place] [address]  Magnesium     Status: None   Collection Time: 02/15/24  3:29 AM  Result Value Ref Range   Magnesium 2.0 1.7 - 2.4 mg/dL    Comment: Performed at [facility], [address], [place], [place] [address]  Phosphorus     Status: Abnormal   Collection Time: 02/15/24  3:29 AM  Result Value Ref Range   Phosphorus 5.1 (H) 2.5 - 4.6 mg/dL    Comment: Performed at [facility], [address] [place], [place] [address]  NM Myocar Multi W/Spect W/Wall Motion / EF     Status: None   Collection Time: 02/15/24 11:48 AM  Result Value Ref Range   Rest HR 68.0 bpm   Rest BP 165/65 mmHg   Exercise duration (min) 1 min   Exercise duration (sec) 0 sec   Estimated workload 1.0    Peak HR 88 bpm   Peak BP 165/65 mmHg   MPHR 137 bpm   Percent HR 64.0 %   ST Depression (mm) 0 mm   Rest Nuclear Isotope Dose 11.1 mCi   Stress Nuclear Isotope Dose 33.0 mCi   SSS 0.0    SRS 1.0    SDS 0.0    TID 0.91    LV sys vol 35.0 mL   LV dias vol 89.0 46 - 106 mL   Nuc Stress EF 61 %      Assessment & Plan:  Continue current medications.  Increase losartan  to 25 mg twice a day.  Monitor blood pressure at home.  Chest x-ray today.  Schedule mammogram. Problem List Items Addressed This Visit     Essential hypertension, benign   Relevant Medications   losartan  (COZAAR ) 25 MG tablet   HLD (hyperlipidemia)   Relevant Medications   losartan  (COZAAR ) 25 MG tablet   CAD (coronary artery disease)   Relevant Medications   losartan  (COZAAR ) 25 MG tablet   Other Visit Diagnoses       Breast cancer screening by mammogram    -  Primary   Relevant Orders   MM 3D SCREENING MAMMOGRAM BILATERAL BREAST     Bilateral upper lobe community acquired pneumonia       Relevant Orders   DG Chest 2 View     Other iron deficiency anemia           Return in about 3 months (around 06/13/2024).   Total time spent: 30 minutes  Aisha Hove, MD  03/13/2024   This document may have been prepared by [facility] Voice Recognition software and as such may include unintentional dictation errors.

## 2024-03-15 ENCOUNTER — Encounter: Payer: Self-pay | Admitting: Cardiovascular Disease

## 2024-03-15 ENCOUNTER — Ambulatory Visit: Admitting: Cardiovascular Disease

## 2024-03-15 VITALS — BP 146/64 | HR 95 | Ht 71.0 in | Wt 188.0 lb

## 2024-03-15 DIAGNOSIS — I10 Essential (primary) hypertension: Secondary | ICD-10-CM

## 2024-03-15 DIAGNOSIS — R0602 Shortness of breath: Secondary | ICD-10-CM | POA: Diagnosis not present

## 2024-03-15 DIAGNOSIS — C44519 Basal cell carcinoma of skin of other part of trunk: Secondary | ICD-10-CM | POA: Diagnosis not present

## 2024-03-15 DIAGNOSIS — C44629 Squamous cell carcinoma of skin of left upper limb, including shoulder: Secondary | ICD-10-CM | POA: Diagnosis not present

## 2024-03-15 DIAGNOSIS — E782 Mixed hyperlipidemia: Secondary | ICD-10-CM

## 2024-03-15 DIAGNOSIS — R0789 Other chest pain: Secondary | ICD-10-CM

## 2024-03-15 DIAGNOSIS — I25112 Atherosclerotic heart disease of native coronary artery with refractory angina pectoris: Secondary | ICD-10-CM | POA: Diagnosis not present

## 2024-03-15 NOTE — Progress Notes (Signed)
 Cardiology Office Note   Date:  03/15/2024   ID:  Sarah Riddle, DOB 1941-08-20, MRN [id number]  PCP:  Aisha Hove, MD  Cardiologist:  Debborah Fairly, MD      History of Present Illness: LAURELLE SKIVER is a 83 y.o. female who presents for  Chief Complaint  Patient presents with   Follow-up    4 Weeks Follow Up    Feeling better after pneumonia      Past Medical History:  Diagnosis Date   A-fib [facility])    Accelerated hypertension 10/31/2022   Age related osteoporosis    Cancer (HCC)    Melanoma x 2 -- Lt Leg -1976 Rt Arm - 2014   Chronic bilateral low back pain    Chronic kidney disease    Complication of anesthesia    DVT (deep venous thrombosis) (HCC) 1960   right leg  was on BC pills   Dysrhythmia    Family history of breast cancer    Family history of colon cancer    Family history of melanoma    Family history of prostate cancer    Fibrocystic breast disease    GERD (gastroesophageal reflux disease)    Headache    migraine   Heart murmur    not heard now.   History of kidney stones    Hyperlipidemia    Hypertension    IBS (irritable bowel syndrome)    Lumbago    Mitral valve prolapse    Neuropathy    Obstructive sleep apnea    Osteoarthritis of shoulder    PONV (postoperative nausea and vomiting)      Past Surgical History:  Procedure Laterality Date   ABDOMINAL HYSTERECTOMY     BACK SURGERY     BREAST BIOPSY Left 04/20/2023   us  bx/ heart clip/ path pending   BREAST BIOPSY Left 04/20/2023   US  LT BREAST BX W LOC DEV 1ST LESION IMG BX SPEC US  GUIDE 04/20/2023 [facility]   BREAST CYST ASPIRATION Left 2001   Negative   CARDIAC ELECTROPHYSIOLOGY MAPPING AND ABLATION     EYE SURGERY     HUMERUS IM NAIL Right 05/05/2022   Procedure: INTRAMEDULLARY (IM) NAIL HUMERAL;  Surgeon: Laneta Pintos, MD;  Location: [facility] OR;  Service: Orthopedics;  Laterality: Right;   HYSTEROTOMY     L4-L5  2020   titaum rebok cage   LEFT HEART CATH AND  CORONARY ANGIOGRAPHY N/A 11/01/2022   Procedure: LEFT HEART CATH AND CORONARY ANGIOGRAPHY and possible PCI and stent;  Surgeon: Cherrie Cornwall, MD;  Location: [facility] INVASIVE CV LAB;  Service: Cardiovascular;  Laterality: N/A;   TOTAL HIP ARTHROPLASTY Left      Current Outpatient Medications  Medication Sig Dispense Refill   acetaminophen  (TYLENOL ) 500 MG tablet Take 500 mg by mouth every 6 (six) hours as needed for moderate pain (pain score 4-6) or mild pain (pain score 1-3). Once a day     alendronate  (FOSAMAX ) 70 MG tablet Take 1 tablet (70 mg total) by mouth every Thursday. Take with a full glass of water on an empty stomach. (Patient taking differently: Take 70 mg by mouth every Tuesday. Take with a full glass of water on an empty stomach.) 12 tablet 2   allopurinol  (ZYLOPRIM ) 100 MG tablet Take 1 tablet (100 mg total) by mouth daily. 90 tablet 1   apixaban  (ELIQUIS ) 5 MG TABS tablet Take 1 tablet (5 mg total) by mouth 2 (two) times daily.  60 tablet 0   azelastine  (ASTELIN ) 0.1 % nasal spray Place 2 sprays into both nostrils at bedtime. Use in each nostril as directed (Patient taking differently: Place 2 sprays into both nostrils daily as needed for allergies or rhinitis. Use in each nostril as directed) 30 mL 12   cyanocobalamin  (VITAMIN B12) 1000 MCG tablet Take 1,000 mcg by mouth daily. Monday to Friday     diclofenac  Sodium (VOLTAREN ) 1 % GEL Apply 2 g topically 3 (three) times daily.     EPINEPHrine  0.3 mg/0.3 mL IJ SOAJ injection Inject 0.3 mg into the muscle as needed for anaphylaxis.     ferrous sulfate  324 MG TBEC Take 324 mg by mouth 2 (two) times daily.     fluticasone  (FLONASE ) 50 MCG/ACT nasal spray Place 2 sprays into both nostrils daily as needed for allergies or rhinitis.     furosemide  (LASIX ) 20 MG tablet Take 20 mg by mouth 3 (three) times a week. Monday, Wednesday, Friday and PRN for weight gain of 1-2 pounds in one day     gabapentin  (NEURONTIN ) 800 MG tablet TAKE 1 TABLET  FOUR TIMES DAILY 360 tablet 3   levocetirizine (XYZAL ) 5 MG tablet TAKE 1 TABLET EVERY EVENING 90 tablet 3   losartan  (COZAAR ) 25 MG tablet Take 1 tablet (25 mg total) by mouth daily. 30 tablet 11   meclizine  (ANTIVERT ) 12.5 MG tablet Take 1 tablet (12.5 mg total) by mouth 3 (three) times daily as needed for dizziness. 30 tablet 0   metoprolol  succinate (TOPROL -XL) 25 MG 24 hr tablet TAKE 1 TABLET EVERY DAY 90 tablet 3   nitroGLYCERIN  (NITROSTAT ) 0.4 MG SL tablet Place 1 tablet (0.4 mg total) under the tongue every 5 (five) minutes as needed for chest pain. 25 tablet 0   OXYGEN  Inhale 2 L/min into the lungs at bedtime. And as needed for SOB     pantoprazole  (PROTONIX ) 40 MG tablet TAKE 1 TABLET EVERY DAY 90 tablet 3   Polyethyl Glyc-Propyl Glyc PF (SYSTANE HYDRATION PF) 0.4-0.3 % SOLN Apply 1 drop to eye daily as needed (dry eyes).     rosuvastatin  (CRESTOR ) 40 MG tablet TAKE 1 TABLET EVERY DAY (Patient taking differently: Take 40 mg by mouth at bedtime.) 90 tablet 3   sertraline  (ZOLOFT ) 25 MG tablet TAKE 1 TABLET THREE TIMES DAILY 180 tablet 5   Vitamin D , Ergocalciferol , 50000 units CAPS Take 1 capsule by mouth once a week. 12 capsule 3   No current facility-administered medications for this visit.    Allergies:   Iodinated contrast media, Ibuprofen, Sulfa antibiotics, Doxycycline, and Tetracyclines & related    Social History:   reports that she has never smoked. She has never used smokeless tobacco. She reports that she does not drink alcohol  and does not use drugs.   Family History:  family history includes Acute myelogenous leukemia in her sister; Breast cancer in an other family member; Cancer in her sister; Colon cancer (age of onset: 32) in her mother; HIV/AIDS (age of onset: 10) in her brother; Melanoma in her brother, brother, niece, and another family member; Prostate cancer in her brother, brother, and father; Tuberculosis in her paternal aunt.    ROS:     Review of Systems   Constitutional: Negative.   HENT: Negative.    Eyes: Negative.   Respiratory: Negative.    Gastrointestinal: Negative.   Genitourinary: Negative.   Musculoskeletal: Negative.   Skin: Negative.   Neurological: Negative.   Endo/Heme/Allergies: Negative.  Psychiatric/Behavioral: Negative.    All other systems reviewed and are negative.     All other systems are reviewed and negative.    PHYSICAL EXAM: VS:  BP (!) 146/64   Pulse 95   Ht 5' 11 (1.803 m)   Wt 188 lb (85.3 kg)   SpO2 96%   BMI 26.22 kg/m  , BMI Body mass index is 26.22 kg/m. Last weight:  Wt Readings from Last 3 Encounters:  03/15/24 188 lb (85.3 kg)  03/13/24 190 lb 6.4 oz (86.4 kg)  03/09/24 187 lb (84.8 kg)     Physical Exam Constitutional:      Appearance: Normal appearance.   Cardiovascular:     Rate and Rhythm: Normal rate and regular rhythm.     Heart sounds: Normal heart sounds.  Pulmonary:     Effort: Pulmonary effort is normal.     Breath sounds: Normal breath sounds.   Musculoskeletal:     Right lower leg: No edema.     Left lower leg: No edema.   Neurological:     Mental Status: She is alert.       EKG:   Recent Labs: 12/22/2023: TSH 0.827 02/13/2024: ALT 21; B Natriuretic Peptide 730.3 02/15/2024: BUN 23; Creatinine, Ser 0.95; Hemoglobin 9.5; Magnesium 2.0; Platelets 245; Potassium 3.8; Sodium 139    Lipid Panel    Component Value Date/Time   CHOL 131 12/22/2023 1049   TRIG 112 12/22/2023 1049   HDL 58 12/22/2023 1049   CHOLHDL 2.3 12/22/2023 1049   LDLCALC 53 12/22/2023 1049      Other studies Reviewed: Additional studies/ records that were reviewed today include:  Review of the above records demonstrates:       No data to display            ASSESSMENT AND PLAN:    ICD-10-CM   1. SOB (shortness of breath)  R06.02    CXR still pending, feeling better, no chest pains, has more energy.    2. Essential hypertension, benign  I10    systolic high, but  diastolic low, so will leave losartan  25 bid    3. Coronary artery disease involving native coronary artery of native heart with refractory angina pectoris (HCC)  I25.112     4. Mixed hyperlipidemia  E78.2     5. Other chest pain  R07.89        Problem List Items Addressed This Visit       Cardiovascular and Mediastinum   Essential hypertension, benign   CAD (coronary artery disease)     Other   HLD (hyperlipidemia)   Other Visit Diagnoses       SOB (shortness of breath)    -  Primary   CXR still pending, feeling better, no chest pains, has more energy.     Other chest pain              Disposition:   Return in about 2 months (around 05/15/2024).    Total time spent: 30 minutes  Signed,  Debborah Fairly, MD  03/15/2024 9:31 AM    [facility]

## 2024-03-16 ENCOUNTER — Other Ambulatory Visit: Payer: Self-pay | Admitting: Cardiovascular Disease

## 2024-03-16 ENCOUNTER — Other Ambulatory Visit: Payer: Self-pay

## 2024-03-16 DIAGNOSIS — I10 Essential (primary) hypertension: Secondary | ICD-10-CM

## 2024-03-16 NOTE — Transitions of Care (Post Inpatient/ED Visit) (Signed)
 Transition of Care week 5  Visit Note  03/16/2024  Name: Sarah Riddle MRN: [id number]          DOB: 07/01/1941  Situation: Patient enrolled in [facility] 30-day program. Visit completed with Oma Bias by telephone.   Background:     Past Medical History:  Diagnosis Date   A-fib [facility])    Accelerated hypertension 10/31/2022   Age related osteoporosis    Cancer (HCC)    Melanoma x 2 -- Lt Leg -1976 Rt Arm - 2014   Chronic bilateral low back pain    Chronic kidney disease    Complication of anesthesia    DVT (deep venous thrombosis) (HCC) 1960   right leg  was on BC pills   Dysrhythmia    Family history of breast cancer    Family history of colon cancer    Family history of melanoma    Family history of prostate cancer    Fibrocystic breast disease    GERD (gastroesophageal reflux disease)    Headache    migraine   Heart murmur    not heard now.   History of kidney stones    Hyperlipidemia    Hypertension    IBS (irritable bowel syndrome)    Lumbago    Mitral valve prolapse    Neuropathy    Obstructive sleep apnea    Osteoarthritis of shoulder    PONV (postoperative nausea and vomiting)     Assessment: Patient Reported Symptoms: Cognitive Cognitive Status: Alert and oriented to person, place, and time      Neurological Neurological Review of Symptoms: Numbness Neurological Management Strategies: Medical device, Medication therapy, Activity Neurological Self-Management Outcome: 4 (good)  HEENT HEENT Symptoms Reported: No symptoms reported      Cardiovascular Cardiovascular Symptoms Reported: No symptoms reported Does patient have uncontrolled Hypertension?: Yes Is patient checking Blood Pressure at home?: Yes Patient's Recent BP reading at home: 146/64 Cardiovascular Conditions: Dysrhythmia, Heart failure, High blood cholesterol, Hypertension Cardiovascular Management Strategies: Medication therapy, Weight management, Routine screening, Activity, Fluid  modification Do You Have a Working Readable Scale?: Yes Weight: 188 lb (85.3 kg) Cardiovascular Self-Management Outcome: 4 (good)  Respiratory Respiratory Symptoms Reported: No symptoms reported Additional Respiratory Details: CPAP at night with oxygen  2l/min Respiratory Conditions: Sleep disordered breathing Respiratory Self-Management Outcome: 4 (good) Respiratory Comment: CXR completed. No resurlts yet  Endocrine Patient reports the following symptoms related to hypoglycemia or hyperglycemia : No symptoms reported Is patient diabetic?: No    Gastrointestinal Gastrointestinal Symptoms Reported: No symptoms reported      Genitourinary Genitourinary Symptoms Reported: No symptoms reported    Integumentary Integumentary Symptoms Reported: No symptoms reported    Musculoskeletal Musculoskelatal Symptoms Reviewed: Weakness Additional Musculoskeletal Details: Tingling in legs and feet Musculoskeletal Conditions: Back pain, Osteoarthritis, Osteopenia, Mobility limited Musculoskeletal Management Strategies: Activity, Medical device, Medication therapy, Weight management Musculoskeletal Comment: Chronic Back Pain Falls in the past year?: Yes Number of falls in past year: 2 or more Was there an injury with Fall?: Yes Fall Risk Category Calculator: 3 Patient Fall Risk Level: High Fall Risk Patient at Risk for Falls Due to: History of fall(s)  Psychosocial Psychosocial Symptoms Reported: No symptoms reported         There were no vitals filed for this visit.  Medications Reviewed Today     Reviewed by Claudene Crystal, RN (Case Manager) on 03/16/24 at 1030  Med List Status: <None>   Medication Order Taking? Sig Documenting Provider Last Dose Status Informant  acetaminophen  (TYLENOL ) 500 MG tablet [id number]  Take 500 mg by mouth every 6 (six) hours as needed for moderate pain (pain score 4-6) or mild pain (pain score 1-3). Once a day [provider]  Active Child            Med Note Ruby Corporal Feb 13, 2024  7:45 AM) prn  alendronate  (FOSAMAX ) 70 MG tablet [id number]  Take 1 tablet (70 mg total) by mouth every Thursday. Take with a full glass of water on an empty stomach.  Patient taking differently: Take 70 mg by mouth every Tuesday. Take with a full glass of water on an empty stomach.   Aisha Hove, MD  Active Child  allopurinol  (ZYLOPRIM ) 100 MG tablet [id number]  Take 1 tablet (100 mg total) by mouth daily. Aisha Hove, MD  Active Child  apixaban  (ELIQUIS ) 5 MG TABS tablet [id number]  Take 1 tablet (5 mg total) by mouth 2 (two) times daily. Aisha Hove, MD  Active Child  azelastine  (ASTELIN ) 0.1 % nasal spray [id number]  Place 2 sprays into both nostrils at bedtime. Use in each nostril as directed  Patient taking differently: Place 2 sprays into both nostrils daily as needed for allergies or rhinitis. Use in each nostril as directed   Aisha Hove, MD  Active Child           Med Note Ruby Corporal Feb 13, 2024  7:42 AM) prn  cyanocobalamin  (VITAMIN B12) 1000 MCG tablet [id number]  Take 1,000 mcg by mouth daily. Monday to Friday [provider]  Active Child  diclofenac  Sodium (VOLTAREN ) 1 % GEL [id number]  Apply 2 g topically 3 (three) times daily. Althia Atlas, MD  Active   EPINEPHrine  0.3 mg/0.3 mL IJ SOAJ injection [id number]  Inject 0.3 mg into the muscle as needed for anaphylaxis. [provider]  Active Child           Med Note Ruby Corporal Feb 13, 2024  7:45 AM) prn  ferrous sulfate  324 MG TBEC [id number]  Take 324 mg by mouth 2 (two) times daily. [provider]  Active   fluticasone  (FLONASE ) 50 MCG/ACT nasal spray [id number]  Place 2 sprays into both nostrils daily as needed for allergies or rhinitis. [provider]  Active Child           Med Note Ruby Corporal Feb 13, 2024  7:50 AM) prn  furosemide  (LASIX ) 20 MG tablet [id number]  Take 20 mg by mouth 3  (three) times a week. Monday, Wednesday, Friday and PRN for weight gain of 1-2 pounds in one day Althia Atlas, MD  Active   gabapentin  (NEURONTIN ) 800 MG tablet [id number]  TAKE 1 TABLET FOUR TIMES DAILY Trenda Frisk, FNP  Active Child  levocetirizine (XYZAL ) 5 MG tablet [id number]  TAKE 1 TABLET EVERY EVENING Aisha Hove, MD  Active Child  losartan  (COZAAR ) 25 MG tablet [id number] Yes Take 1 tablet (25 mg total) by mouth daily.  Patient taking differently: Take 25 mg by mouth 2 (two) times daily.   Aisha Hove, MD  Active   meclizine  (ANTIVERT ) 12.5 MG tablet [id number]  Take 1 tablet (12.5 mg total) by mouth 3 (three) times daily as needed for dizziness. [place]ale Landmark, NP  Active Child           Med Note Ruby Corporal Feb 13, 2024  7:42 AM) prn  metoprolol  succinate (TOPROL -XL) 25 MG 24 hr tablet [id number]  TAKE 1 TABLET EVERY DAY Cherrie Cornwall, MD  Active   nitroGLYCERIN  (NITROSTAT ) 0.4 MG SL tablet [id number]  Place 1 tablet (0.4 mg total) under the tongue every 5 (five) minutes as needed for chest pain. Aisha Hove, MD  Active Child           Med Note Ruby Corporal Feb 13, 2024  7:40 AM) prn  OXYGEN  [id number]  Inhale 2 L/min into the lungs at bedtime. And as needed for SOB [provider]  Active Child  pantoprazole  (PROTONIX ) 40 MG tablet [id number]  TAKE 1 TABLET EVERY DAY Cherrie Cornwall, MD  Active Child  Polyethyl Glyc-Propyl Glyc PF (SYSTANE HYDRATION PF) 0.4-0.3 % SOLN [id number]  Apply 1 drop to eye daily as needed (dry eyes). [provider]  Active Child           Med Note Ruby Corporal Feb 13, 2024  7:45 AM) prn  rosuvastatin  (CRESTOR ) 40 MG tablet [id number]  TAKE 1 TABLET EVERY DAY  Patient taking differently: Take 40 mg by mouth at bedtime.   Cherrie Cornwall, MD  Active Child  sertraline  (ZOLOFT ) 25 MG tablet [id number]  TAKE 1 TABLET THREE TIMES DAILY Aisha Hove, MD  Active Child  Vitamin D ,  Ergocalciferol , 50000 units CAPS [id number]  Take 1 capsule by mouth once a week. Aisha Hove, MD  Active Child           Med Note Ruby Corporal Feb 13, 2024  7:50 AM) Friday   Med List Note Isidore Mares, [place] 05/03/22 1307): Pt has CPAP Machine that she uses with 2L of oxygen              Recommendation:   Complete the TOC 30 Day Program  Follow Up Plan:   Closing From:  Transitions of Care Program  [facility], BSN, RN [place]  VBCI - [facility] RN Care Manager [phone]

## 2024-03-16 NOTE — Patient Instructions (Signed)
 Visit Information  Thank you for taking time to visit with me today. Please don't hesitate to contact me if I can be of assistance to you before our next scheduled telephone appointment.  Following is a copy of your care plan:   Goals Addressed             This Visit's Progress    [facility] Transitions of Care (TOC) Care Plan       Problems: (reviewed 03/16/24) Recent Hospitalization for treatment of CHF Pneumonia Functional/Safety concern: History of Falls  Goal: (reviewed 03/16/24) Over the next 30 days, the patient will not experience hospital readmission  Interventions: (reviewed 03/16/24)  Heart Failure Interventions: Provided education on low sodium diet Assessed need for readable accurate scales in home Provided education about placing scale on hard, flat surface Advised patient to weigh each morning after emptying bladder Discussed importance of daily weight and advised patient to weigh and record daily Reviewed role of diuretics in prevention of fluid overload and management of heart failure; Discussed the importance of keeping all appointments with provider Screening for signs and symptoms of depression related to chronic disease state  Assessed social determinant of health barriers  Take Lasix  three times a week and as need for weight gain of 1-2 pounds a day Monitor BP daily for Hypotension. Contact provider for dystolic below 50  Patient Self Care Activities: (reviewed 03/15/24) Attend all scheduled provider appointments Call pharmacy for medication refills 3-7 days in advance of running out of medications Call provider office for new concerns or questions  Notify RN Care Manager of TOC call rescheduling needs Participate in Transition of Care Program/Attend TOC scheduled calls keep legs up while sitting use salt in moderation watch for swelling in feet, ankles and legs every day weigh myself daily track symptoms and what helps feel better or worse  Plan:   Telephone follow up appointment with care management team member scheduled for:  Goals have been met. The patient is unenrolled from the [facility] program        Patient verbalizes understanding of instructions and care plan provided today and agrees to view in MyChart. Active MyChart status and patient understanding of how to access instructions and care plan via MyChart confirmed with patient.     The patient has been provided with contact information for the care management team and has been advised to call with any health related questions or concerns.   Please call the care guide team at [phone] if you need to cancel or reschedule your appointment.   Please call the Suicide and Crisis Lifeline: 988 call the [place]  National Suicide Prevention Lifeline: 419-829-8822 or TTY: 423-154-6233 TTY 631 313 5210) to talk to a trained counselor if you are experiencing a Mental Health or Behavioral Health Crisis or need someone to talk to.  Gareld June, BSN, RN [place]  [facility] - [employer] [facility] RN Care Manager [phone]

## 2024-03-19 ENCOUNTER — Other Ambulatory Visit: Payer: Self-pay | Admitting: Internal Medicine

## 2024-03-21 DIAGNOSIS — L239 Allergic contact dermatitis, unspecified cause: Secondary | ICD-10-CM | POA: Diagnosis not present

## 2024-03-30 DIAGNOSIS — I2510 Atherosclerotic heart disease of native coronary artery without angina pectoris: Secondary | ICD-10-CM | POA: Diagnosis not present

## 2024-03-30 DIAGNOSIS — I480 Paroxysmal atrial fibrillation: Secondary | ICD-10-CM | POA: Diagnosis not present

## 2024-04-02 ENCOUNTER — Ambulatory Visit: Payer: Self-pay | Admitting: Internal Medicine

## 2024-04-02 NOTE — Progress Notes (Signed)
 Patient notified

## 2024-04-17 ENCOUNTER — Ambulatory Visit
Admission: RE | Admit: 2024-04-17 | Discharge: 2024-04-17 | Disposition: A | Discharge status: Home / Self Care | Source: Ambulatory Visit | Attending: Internal Medicine | Admitting: Internal Medicine

## 2024-04-17 DIAGNOSIS — Z1231 Encounter for screening mammogram for malignant neoplasm of breast: Secondary | ICD-10-CM | POA: Diagnosis not present

## 2024-04-23 DIAGNOSIS — C44529 Squamous cell carcinoma of skin of other part of trunk: Secondary | ICD-10-CM | POA: Diagnosis not present

## 2024-04-23 DIAGNOSIS — L905 Scar conditions and fibrosis of skin: Secondary | ICD-10-CM | POA: Diagnosis not present

## 2024-04-26 ENCOUNTER — Ambulatory Visit: Admitting: Internal Medicine

## 2024-04-29 DIAGNOSIS — I480 Paroxysmal atrial fibrillation: Secondary | ICD-10-CM | POA: Diagnosis not present

## 2024-04-29 DIAGNOSIS — I2510 Atherosclerotic heart disease of native coronary artery without angina pectoris: Secondary | ICD-10-CM | POA: Diagnosis not present

## 2024-05-07 ENCOUNTER — Other Ambulatory Visit: Payer: Self-pay | Admitting: Internal Medicine

## 2024-05-07 DIAGNOSIS — E790 Hyperuricemia without signs of inflammatory arthritis and tophaceous disease: Secondary | ICD-10-CM

## 2024-05-10 ENCOUNTER — Inpatient Hospital Stay: Payer: Medicare PPO | Attending: Oncology

## 2024-05-10 DIAGNOSIS — Z79899 Other long term (current) drug therapy: Secondary | ICD-10-CM | POA: Insufficient documentation

## 2024-05-10 DIAGNOSIS — Z800 Family history of malignant neoplasm of digestive organs: Secondary | ICD-10-CM | POA: Insufficient documentation

## 2024-05-10 DIAGNOSIS — Z87442 Personal history of urinary calculi: Secondary | ICD-10-CM | POA: Diagnosis not present

## 2024-05-10 DIAGNOSIS — K580 Irritable bowel syndrome with diarrhea: Secondary | ICD-10-CM | POA: Diagnosis not present

## 2024-05-10 DIAGNOSIS — M810 Age-related osteoporosis without current pathological fracture: Secondary | ICD-10-CM | POA: Insufficient documentation

## 2024-05-10 DIAGNOSIS — G4730 Sleep apnea, unspecified: Secondary | ICD-10-CM | POA: Diagnosis not present

## 2024-05-10 DIAGNOSIS — K760 Fatty (change of) liver, not elsewhere classified: Secondary | ICD-10-CM | POA: Diagnosis not present

## 2024-05-10 DIAGNOSIS — Z791 Long term (current) use of non-steroidal anti-inflammatories (NSAID): Secondary | ICD-10-CM | POA: Diagnosis not present

## 2024-05-10 DIAGNOSIS — Z7901 Long term (current) use of anticoagulants: Secondary | ICD-10-CM | POA: Insufficient documentation

## 2024-05-10 DIAGNOSIS — Z8042 Family history of malignant neoplasm of prostate: Secondary | ICD-10-CM | POA: Diagnosis not present

## 2024-05-10 DIAGNOSIS — Z85820 Personal history of malignant melanoma of skin: Secondary | ICD-10-CM | POA: Diagnosis not present

## 2024-05-10 DIAGNOSIS — I341 Nonrheumatic mitral (valve) prolapse: Secondary | ICD-10-CM | POA: Diagnosis not present

## 2024-05-10 DIAGNOSIS — I4891 Unspecified atrial fibrillation: Secondary | ICD-10-CM | POA: Insufficient documentation

## 2024-05-10 DIAGNOSIS — K219 Gastro-esophageal reflux disease without esophagitis: Secondary | ICD-10-CM | POA: Diagnosis not present

## 2024-05-10 DIAGNOSIS — Z803 Family history of malignant neoplasm of breast: Secondary | ICD-10-CM | POA: Insufficient documentation

## 2024-05-10 DIAGNOSIS — E785 Hyperlipidemia, unspecified: Secondary | ICD-10-CM | POA: Diagnosis not present

## 2024-05-10 DIAGNOSIS — I10 Essential (primary) hypertension: Secondary | ICD-10-CM | POA: Diagnosis not present

## 2024-05-10 DIAGNOSIS — R011 Cardiac murmur, unspecified: Secondary | ICD-10-CM | POA: Diagnosis not present

## 2024-05-10 LAB — CBC WITH DIFFERENTIAL (CANCER CENTER ONLY)
Abs Immature Granulocytes: 0.01 K/uL (ref 0.00–0.07)
Basophils Absolute: 0.1 K/uL (ref 0.0–0.1)
Basophils Relative: 1 %
Eosinophils Absolute: 0.2 K/uL (ref 0.0–0.5)
Eosinophils Relative: 5 %
HCT: 33.7 % — ABNORMAL LOW (ref 36.0–46.0)
Hemoglobin: 10.7 g/dL — ABNORMAL LOW (ref 12.0–15.0)
Immature Granulocytes: 0 %
Lymphocytes Relative: 40 %
Lymphs Abs: 1.9 K/uL (ref 0.7–4.0)
MCH: 29.1 pg (ref 26.0–34.0)
MCHC: 31.8 g/dL (ref 30.0–36.0)
MCV: 91.6 fL (ref 80.0–100.0)
Monocytes Absolute: 0.3 K/uL (ref 0.1–1.0)
Monocytes Relative: 7 %
Neutro Abs: 2.3 K/uL (ref 1.7–7.7)
Neutrophils Relative %: 47 %
Platelet Count: 172 K/uL (ref 150–400)
RBC: 3.68 MIL/uL — ABNORMAL LOW (ref 3.87–5.11)
RDW: 13.6 % (ref 11.5–15.5)
WBC Count: 4.8 K/uL (ref 4.0–10.5)
nRBC: 0 % (ref 0.0–0.2)

## 2024-05-10 LAB — IRON AND TIBC
Iron: 89 ug/dL (ref 28–170)
Saturation Ratios: 31 % (ref 10.4–31.8)
TIBC: 290 ug/dL (ref 250–450)
UIBC: 201 ug/dL

## 2024-05-10 LAB — CMP (CANCER CENTER ONLY)
ALT: 10 U/L (ref 0–44)
AST: 19 U/L (ref 15–41)
Albumin: 3.5 g/dL (ref 3.5–5.0)
Alkaline Phosphatase: 59 U/L (ref 38–126)
Anion gap: 6 (ref 5–15)
BUN: 16 mg/dL (ref 8–23)
CO2: 26 mmol/L (ref 22–32)
Calcium: 9.4 mg/dL (ref 8.9–10.3)
Chloride: 109 mmol/L (ref 98–111)
Creatinine: 0.8 mg/dL (ref 0.44–1.00)
GFR, Estimated: >60 mL/min (ref >60)
Glucose, Bld: 94 mg/dL (ref 70–99)
Potassium: 3.2 mmol/L — ABNORMAL LOW (ref 3.5–5.1)
Sodium: 141 mmol/L (ref 135–145)
Total Bilirubin: 0.6 mg/dL (ref 0.0–1.2)
Total Protein: 6.1 g/dL — ABNORMAL LOW (ref 6.5–8.1)

## 2024-05-10 LAB — FERRITIN: Ferritin: 17 ng/mL (ref 11–307)

## 2024-05-10 LAB — VITAMIN B12: Vitamin B-12: 379 pg/mL (ref 180–914)

## 2024-05-14 ENCOUNTER — Encounter: Payer: Self-pay | Admitting: Oncology

## 2024-05-14 ENCOUNTER — Inpatient Hospital Stay: Payer: Medicare PPO | Admitting: Oncology

## 2024-05-14 DIAGNOSIS — K760 Fatty (change of) liver, not elsewhere classified: Secondary | ICD-10-CM | POA: Diagnosis not present

## 2024-05-14 DIAGNOSIS — D649 Anemia, unspecified: Secondary | ICD-10-CM | POA: Diagnosis not present

## 2024-05-14 DIAGNOSIS — Z791 Long term (current) use of non-steroidal anti-inflammatories (NSAID): Secondary | ICD-10-CM | POA: Diagnosis not present

## 2024-05-14 DIAGNOSIS — R011 Cardiac murmur, unspecified: Secondary | ICD-10-CM | POA: Diagnosis not present

## 2024-05-14 DIAGNOSIS — I341 Nonrheumatic mitral (valve) prolapse: Secondary | ICD-10-CM | POA: Diagnosis not present

## 2024-05-14 DIAGNOSIS — Z79899 Other long term (current) drug therapy: Secondary | ICD-10-CM | POA: Diagnosis not present

## 2024-05-14 DIAGNOSIS — I4891 Unspecified atrial fibrillation: Secondary | ICD-10-CM | POA: Diagnosis not present

## 2024-05-14 DIAGNOSIS — G4730 Sleep apnea, unspecified: Secondary | ICD-10-CM | POA: Diagnosis not present

## 2024-05-14 DIAGNOSIS — K580 Irritable bowel syndrome with diarrhea: Secondary | ICD-10-CM | POA: Diagnosis not present

## 2024-05-14 NOTE — Assessment & Plan Note (Signed)
 Iron level is not consistent with iron deficiency. Normal folate.  Continue  empiric B12 daily. Hb has improved.  No M protein, mildly elevated kappa/lamda light chain ratio, non specific.  Close monitor

## 2024-05-14 NOTE — Progress Notes (Signed)
 Hematology/Oncology Progress note [phone] [phone]       Patient Care Team: Fernand Fredy RAMAN, MD as PCP - General (Internal Medicine) Babara Call, MD as Consulting Physician (Oncology)  ASSESSMENT & PLAN:   Hereditary hemochromatosis [facility]) #Hereditary hemochromatosis, homozygous HD63 mutation.  Labs reviewed and discussed with patient. Lab Results  Component Value Date   HGB 10.7 (L) 05/10/2024   TIBC 290 05/10/2024   IRONPCTSAT 31 05/10/2024   FERRITIN 17 05/10/2024     No signs of iron over load, no need for phlebotomy   Fatty liver disease, nonalcoholic Stable LFT Follow up with GI  Normocytic anemia Iron level is not consistent with iron deficiency. Normal folate.  Continue  empiric B12 1000mcg daily. Hb has improved.  No M protein, mildly elevated kappa/lamda light chain ratio, non specific.  Close monitor  Orders Placed This Encounter  Procedures   CBC with Differential (Cancer Center Only)    Standing Status:   Future    Expected Date:   11/14/2024    Expiration Date:   02/12/2025   Iron and TIBC    Standing Status:   Future    Expected Date:   11/14/2024    Expiration Date:   02/12/2025   Ferritin    Standing Status:   Future    Expected Date:   11/14/2024    Expiration Date:   02/12/2025   Vitamin B12    Standing Status:   Future    Expected Date:   11/14/2024    Expiration Date:   02/12/2025   Hepatic function panel    Standing Status:   Future    Expected Date:   11/14/2024    Expiration Date:   02/12/2025   Follow up in 6 months.  All questions were answered. The patient knows to call the clinic with any problems, questions or concerns.  Call Babara, MD, PhD [facility] Hematology Oncology 05/14/2024   CHIEF COMPLAINTS/REASON FOR VISIT:  Follow up for homozygous hemochromatosis gene mutation, abnormal ultrasound liver  HISTORY OF PRESENTING ILLNESS:   Sarah Riddle is a  83 y.o.  female with PMH listed below was seen in  consultation at the request of  Fernand Fredy RAMAN, MD  for evaluation of anemia Reviewed records from primary care provider 04/09/2021, patient had a CBC done which showed a hemoglobin of 10.9, MCV 89.  Normal total WBC, normal platelet count.  Creatinine is slightly elevated and 1.13, EGFR 49, normal bilirubin. TSH 1.48 6 05/12/2021, repeat hemoglobin 10.4, MCV 91, normal platelet and total WBC. Patient reports some fatigue.  Denies any abdominal pain, black or bloody stool. Patient is on chronic anticoagulation with Coumadin  for history of A. fib. Patient reports that she has previously taken oral iron supplementation which caused diarrhea.  She has a personal history of melanoma x2 status post resection by dermatologist.  She has a family history significant for father and 2 brothers with prostate cancer, a sister with acute leukemia.  Mother with colon cancer.  Patient reports to have colonoscopy done recently.  No colonoscopy records and currently picks  10/20/2020, US  right upper quadrant showed coarse increased echogenicity of the parenchyma with no focal mass in the liver.  patient has establish care with gastroenterology Dr. Onita 01/04/2022, ultrasound elastography of the liver showed diffusely increased hepatic parenchymal echogenicity with decreased acoustic through transmission most commonly reflects hepatic steatosis.  No overt hepatic lesion discovered. 01/18/2022, ultrasound right upper quadrant showed increased echogenicity of the  liver parenchyma.  Family history of cancer, genetic testing was negative for pathogenic mutations. She is on Eliquis  for anticoagulation. Used to take  warfarin due to expense.   INTERVAL HISTORY Sarah Riddle is a 83 y.o. female who has above history reviewed by me today presents for follow up visit for homozygous hemochromatosis gene mutation, abnormal liver ultrasound. Patient has no new complaints She denies any bleeding events.    Review of  Systems  Constitutional:  Negative for appetite change, chills, fatigue and fever.  HENT:   Negative for hearing loss and voice change.   Eyes:  Negative for eye problems.  Respiratory:  Negative for chest tightness and cough.   Cardiovascular:  Negative for chest pain.  Gastrointestinal:  Negative for abdominal distention, abdominal pain and blood in stool.  Endocrine: Negative for hot flashes.  Genitourinary:  Negative for difficulty urinating and frequency.   Musculoskeletal:  Negative for arthralgias.  Skin:  Negative for itching and rash.  Neurological:  Negative for extremity weakness.  Hematological:  Negative for adenopathy.  Psychiatric/Behavioral:  Negative for confusion.     MEDICAL HISTORY:  Past Medical History:  Diagnosis Date   A-fib [facility])    Accelerated hypertension 10/31/2022   Age related osteoporosis    Cancer (HCC)    Melanoma x 2 -- Lt Leg -1976 Rt Arm - 2014   Chronic bilateral low back pain    Chronic kidney disease    Complication of anesthesia    DVT (deep venous thrombosis) (HCC) 1960   right leg  was on BC pills   Dysrhythmia    Family history of breast cancer    Family history of colon cancer    Family history of melanoma    Family history of prostate cancer    Fibrocystic breast disease    GERD (gastroesophageal reflux disease)    Headache    migraine   Heart murmur    not heard now.   History of kidney stones    Hyperlipidemia    Hypertension    IBS (irritable bowel syndrome)    Lumbago    Mitral valve prolapse    Neuropathy    Obstructive sleep apnea    Osteoarthritis of shoulder    PONV (postoperative nausea and vomiting)     SURGICAL HISTORY: Past Surgical History:  Procedure Laterality Date   ABDOMINAL HYSTERECTOMY     BACK SURGERY     BREAST BIOPSY Left 04/20/2023   us  bx/ heart clip/ path pending   BREAST BIOPSY Left 04/20/2023   US  LT BREAST BX W LOC DEV 1ST LESION IMG BX SPEC US  GUIDE 04/20/2023 [facility]   BREAST  CYST ASPIRATION Left 2001   Negative   CARDIAC ELECTROPHYSIOLOGY MAPPING AND ABLATION     EYE SURGERY     HUMERUS IM NAIL Right 05/05/2022   Procedure: INTRAMEDULLARY (IM) NAIL HUMERAL;  Surgeon: Kendal Franky SQUIBB, MD;  Location: [facility] OR;  Service: Orthopedics;  Laterality: Right;   HYSTEROTOMY     L4-L5  2020   titaum rebok cage   LEFT HEART CATH AND CORONARY ANGIOGRAPHY N/A 11/01/2022   Procedure: LEFT HEART CATH AND CORONARY ANGIOGRAPHY and possible PCI and stent;  Surgeon: Fernand Denyse LABOR, MD;  Location: [facility] INVASIVE CV LAB;  Service: Cardiovascular;  Laterality: N/A;   TOTAL HIP ARTHROPLASTY Left     SOCIAL HISTORY: Social History   Socioeconomic History   Marital status: Married    Spouse name: Not on file  Number of children: Not on file   Years of education: Not on file   Highest education level: Not on file  Occupational History   Not on file  Tobacco Use   Smoking status: Never   Smokeless tobacco: Never  Vaping Use   Vaping status: Never Used  Substance and Sexual Activity   Alcohol  use: No   Drug use: No   Sexual activity: Not Currently  Other Topics Concern   Not on file  Social History Narrative   Not on file   Social Drivers of Health   Financial Resource Strain: Low Risk  (09/23/2023)   Overall Financial Resource Strain (CARDIA)    Difficulty of Paying Living Expenses: Not hard at all  Food Insecurity: No Food Insecurity (02/16/2024)   Hunger Vital Sign    Worried About Running Out of Food in the Last Year: Never true    Ran Out of Food in the Last Year: Never true  Transportation Needs: No Transportation Needs (02/16/2024)   PRAPARE - Administrator, Civil Service (Medical): No    Lack of Transportation (Non-Medical): No  Physical Activity: Inactive (09/23/2023)   Exercise Vital Sign    Days of Exercise per Week: 0 days    Minutes of Exercise per Session: 0 min  Stress: No Stress Concern Present (09/23/2023)   [employer] of  Occupational Health - Occupational Stress Questionnaire    Feeling of Stress : Only a little  Social Connections: Moderately Isolated (02/13/2024)   Social Connection and Isolation Panel    Frequency of Communication with Friends and Family: More than three times a week    Frequency of Social Gatherings with Friends and Family: Twice a week    Attends Religious Services: Never    Database administrator or Organizations: No    Attends Banker Meetings: Never    Marital Status: Married  Catering manager Violence: Not At Risk (02/16/2024)   Humiliation, Afraid, Rape, and Kick questionnaire    Fear of Current or Ex-Partner: No    Emotionally Abused: No    Physically Abused: No    Sexually Abused: No    FAMILY HISTORY: Family History  Problem Relation Age of Onset   Colon cancer Mother 27   Prostate cancer Father        dx 32s   Cancer Sister    Acute myelogenous leukemia Sister        dx 71s   Prostate cancer Brother    Melanoma Brother    Prostate cancer Brother    Melanoma Brother    HIV/AIDS Brother 71   Tuberculosis Paternal Aunt    Melanoma Other    Breast cancer Other    Melanoma Niece     ALLERGIES:  is allergic to iodinated contrast media, ibuprofen, sulfa antibiotics, doxycycline, and tetracyclines & related.  MEDICATIONS:  Current Outpatient Medications  Medication Sig Dispense Refill   acetaminophen  (TYLENOL ) 500 MG tablet Take 500 mg by mouth every 6 (six) hours as needed for moderate pain (pain score 4-6) or mild pain (pain score 1-3). Once a day     alendronate  (FOSAMAX ) 70 MG tablet TAKE 1 TABLET WKEELY EVERY THURSDAY. TAKE WITH A FULL GLASS OF WATER ON AN EMPTY STOMACH. 12 tablet 3   allopurinol  (ZYLOPRIM ) 100 MG tablet TAKE 1 TABLET EVERY DAY 90 tablet 0   apixaban  (ELIQUIS ) 5 MG TABS tablet Take 1 tablet (5 mg total) by mouth 2 (two) times daily. 60  tablet 0   azelastine  (ASTELIN ) 0.1 % nasal spray Place 2 sprays into both nostrils at bedtime.  Use in each nostril as directed 30 mL 12   cyanocobalamin  (VITAMIN B12) 1000 MCG tablet Take 1,000 mcg by mouth daily. Monday to Friday     diclofenac  Sodium (VOLTAREN ) 1 % GEL Apply 2 g topically 3 (three) times daily.     EPINEPHrine  0.3 mg/0.3 mL IJ SOAJ injection Inject 0.3 mg into the muscle as needed for anaphylaxis.     ferrous sulfate  324 MG TBEC Take 324 mg by mouth 2 (two) times daily.     fluticasone  (FLONASE ) 50 MCG/ACT nasal spray Place 2 sprays into both nostrils daily as needed for allergies or rhinitis.     furosemide  (LASIX ) 20 MG tablet Take 20 mg by mouth 3 (three) times a week. Monday, Wednesday, Friday and PRN for weight gain of 1-2 pounds in one day     gabapentin  (NEURONTIN ) 800 MG tablet TAKE 1 TABLET FOUR TIMES DAILY 360 tablet 3   isosorbide  mononitrate (IMDUR ) 60 MG 24 hr tablet TAKE 1 TABLET EVERY DAY 90 tablet 3   levocetirizine (XYZAL ) 5 MG tablet TAKE 1 TABLET EVERY EVENING 90 tablet 3   losartan  (COZAAR ) 25 MG tablet Take 1 tablet (25 mg total) by mouth daily. (Patient taking differently: Take 25 mg by mouth 2 (two) times daily.) 30 tablet 11   meclizine  (ANTIVERT ) 12.5 MG tablet Take 1 tablet (12.5 mg total) by mouth 3 (three) times daily as needed for dizziness. 30 tablet 0   metoprolol  succinate (TOPROL -XL) 25 MG 24 hr tablet TAKE 1 TABLET EVERY DAY 90 tablet 3   nitroGLYCERIN  (NITROSTAT ) 0.4 MG SL tablet Place 1 tablet (0.4 mg total) under the tongue every 5 (five) minutes as needed for chest pain. 25 tablet 0   OXYGEN  Inhale 2 L/min into the lungs at bedtime. And as needed for SOB     pantoprazole  (PROTONIX ) 40 MG tablet TAKE 1 TABLET EVERY DAY 90 tablet 3   Polyethyl Glyc-Propyl Glyc PF (SYSTANE HYDRATION PF) 0.4-0.3 % SOLN Apply 1 drop to eye daily as needed (dry eyes).     rosuvastatin  (CRESTOR ) 40 MG tablet TAKE 1 TABLET EVERY DAY 90 tablet 3   sertraline  (ZOLOFT ) 25 MG tablet TAKE 1 TABLET THREE TIMES DAILY 180 tablet 5   Vitamin D , Ergocalciferol , 50000  units CAPS Take 1 capsule by mouth once a week. 12 capsule 3   No current facility-administered medications for this visit.     PHYSICAL EXAMINATION: ECOG PERFORMANCE STATUS: 1 - Symptomatic but completely ambulatory Vitals:   05/14/24 1034 05/14/24 1045  BP: (!) 173/66 (!) 182/75  Pulse: (!) 59   Resp: 18   Temp: (!) 96 F (35.6 C)   SpO2: 97%    Filed Weights   05/14/24 1034  Weight: 193 lb 6.4 oz (87.7 kg)    Physical Exam Constitutional:      General: She is not in acute distress.    Comments: Patient ambulates independently  HENT:     Head: Normocephalic and atraumatic.  Eyes:     General: No scleral icterus. Cardiovascular:     Rate and Rhythm: Normal rate and regular rhythm.     Heart sounds: Normal heart sounds.  Pulmonary:     Effort: Pulmonary effort is normal. No respiratory distress.     Breath sounds: No wheezing.  Abdominal:     General: Bowel sounds are normal. There is no distension.  Palpations: Abdomen is soft.  Musculoskeletal:        General: No deformity. Normal range of motion.     Cervical back: Normal range of motion and neck supple.  Skin:    General: Skin is warm and dry.     Findings: No erythema or rash.  Neurological:     Mental Status: She is alert and oriented to person, place, and time. Mental status is at baseline.     Cranial Nerves: No cranial nerve deficit.     Coordination: Coordination normal.  Psychiatric:        Mood and Affect: Mood normal.     LABORATORY DATA:  I have reviewed the data as listed    Latest Ref Rng & Units 05/10/2024    8:47 AM 02/15/2024    3:29 AM 02/14/2024    5:01 AM  CBC  WBC 4.0 - 10.5 K/uL 4.8  5.9  5.8   Hemoglobin 12.0 - 15.0 g/dL 89.2  9.5  9.6   Hematocrit 36.0 - 46.0 % 33.7  30.6  30.7   Platelets 150 - 400 K/uL 172  245  262       Latest Ref Rng & Units 05/10/2024    8:47 AM 02/15/2024    3:29 AM 02/14/2024    5:01 AM  CMP  Glucose 70 - 99 mg/dL 94  892  88   BUN 8 - 23 mg/dL 16   23  15    Creatinine 0.44 - 1.00 mg/dL 9.19  9.04  9.15   Sodium 135 - 145 mmol/L 141  139  141   Potassium 3.5 - 5.1 mmol/L 3.2  3.8  4.1   Chloride 98 - 111 mmol/L 109  105  105   CO2 22 - 32 mmol/L 26  28  28    Calcium  8.9 - 10.3 mg/dL 9.4  9.1  9.0   Total Protein 6.5 - 8.1 g/dL 6.1     Total Bilirubin 0.0 - 1.2 mg/dL 0.6     Alkaline Phos 38 - 126 U/L 59     AST 15 - 41 U/L 19     ALT 0 - 44 U/L 10        Iron/TIBC/Ferritin/ %Sat    Component Value Date/Time   IRON 89 05/10/2024 0847   TIBC 290 05/10/2024 0847   FERRITIN 17 05/10/2024 0847   IRONPCTSAT 31 05/10/2024 0847      RADIOGRAPHIC STUDIES: I have personally reviewed the radiological images as listed and agreed with the findings in the report. MM 3D SCREENING MAMMOGRAM BILATERAL BREAST Result Date: 04/20/2024 CLINICAL DATA:  Screening. EXAM: DIGITAL SCREENING BILATERAL MAMMOGRAM WITH TOMOSYNTHESIS AND CAD TECHNIQUE: Bilateral screening digital craniocaudal and mediolateral oblique mammograms were obtained. Bilateral screening digital breast tomosynthesis was performed. The images were evaluated with computer-aided detection. COMPARISON:  Previous exam(s). ACR Breast Density Category b: There are scattered areas of fibroglandular density. FINDINGS: There are no findings suspicious for malignancy. IMPRESSION: No mammographic evidence of malignancy. A result letter of this screening mammogram will be mailed directly to the patient. RECOMMENDATION: Screening mammogram in one year. (Code:SM-B-01Y) BI-RADS CATEGORY  1: Negative. Electronically Signed   By: Alm Parkins M.D.   On: 04/20/2024 13:52   NM Myocar Multi W/Spect W/Wall Motion / EF Result Date: 02/15/2024   The study is normal. The study is low risk.   No ST deviation was noted.   LV perfusion is normal. There is no evidence of ischemia. There is no  evidence of infarction.   Left ventricular function is normal. End diastolic cavity size is normal. End systolic cavity  size is normal.

## 2024-05-14 NOTE — Assessment & Plan Note (Signed)
#  Hereditary hemochromatosis, homozygous HD63 mutation.  Labs reviewed and discussed with patient. Lab Results  Component Value Date   HGB 10.7 (L) 05/10/2024   TIBC 290 05/10/2024   IRONPCTSAT 31 05/10/2024   FERRITIN 17 05/10/2024     No signs of iron over load, no need for phlebotomy

## 2024-05-14 NOTE — Assessment & Plan Note (Addendum)
 Stable LFT Follow up with GI

## 2024-05-16 ENCOUNTER — Encounter: Payer: Self-pay | Admitting: Cardiovascular Disease

## 2024-05-17 ENCOUNTER — Encounter: Payer: Self-pay | Admitting: Cardiovascular Disease

## 2024-05-17 ENCOUNTER — Ambulatory Visit: Admitting: Cardiovascular Disease

## 2024-05-17 VITALS — BP 200/80 | HR 74 | Ht 71.0 in | Wt 195.2 lb

## 2024-05-17 DIAGNOSIS — R0602 Shortness of breath: Secondary | ICD-10-CM | POA: Diagnosis not present

## 2024-05-17 DIAGNOSIS — I25112 Atherosclerotic heart disease of native coronary artery with refractory angina pectoris: Secondary | ICD-10-CM | POA: Diagnosis not present

## 2024-05-17 DIAGNOSIS — I10 Essential (primary) hypertension: Secondary | ICD-10-CM | POA: Diagnosis not present

## 2024-05-17 DIAGNOSIS — E782 Mixed hyperlipidemia: Secondary | ICD-10-CM | POA: Diagnosis not present

## 2024-05-17 DIAGNOSIS — R0789 Other chest pain: Secondary | ICD-10-CM

## 2024-05-17 MED ORDER — HYDRALAZINE HCL 25 MG PO TABS: 25.0000 mg | ORAL_TABLET | Freq: Two times a day (BID) | ORAL | 11 refills | Status: DC

## 2024-05-17 MED ORDER — LOSARTAN POTASSIUM 50 MG PO TABS: 50.0000 mg | ORAL_TABLET | Freq: Two times a day (BID) | ORAL | 11 refills | Status: DC

## 2024-05-17 NOTE — Progress Notes (Signed)
 Cardiology Office Note   Date:  05/17/2024   ID:  Sarah Riddle, Sarah Riddle 1941-03-20, [name]  PCP:  Fernand Fredy RAMAN, MD  Cardiologist:  Denyse Fernand, MD      History of Present Illness: Sarah Riddle is a 83 y.o. female who presents for  Chief Complaint  Patient presents with   Follow-up    High BP per SK    Has headaches and elevated BP for 1 week.      Past Medical History:  Diagnosis Date   A-fib [facility])    Accelerated hypertension 10/31/2022   Age related osteoporosis    Cancer (HCC)    Melanoma x 2 -- Lt Leg -1976 Rt Arm - 2014   Chronic bilateral low back pain    Chronic kidney disease    Complication of anesthesia    DVT (deep venous thrombosis) (HCC) 1960   right leg  was on BC pills   Dysrhythmia    Family history of breast cancer    Family history of colon cancer    Family history of melanoma    Family history of prostate cancer    Fibrocystic breast disease    GERD (gastroesophageal reflux disease)    Headache    migraine   Heart murmur    not heard now.   History of kidney stones    Hyperlipidemia    Hypertension    IBS (irritable bowel syndrome)    Lumbago    Mitral valve prolapse    Neuropathy    Obstructive sleep apnea    Osteoarthritis of shoulder    PONV (postoperative nausea and vomiting)      Past Surgical History:  Procedure Laterality Date   ABDOMINAL HYSTERECTOMY     BACK SURGERY     BREAST BIOPSY Left 04/20/2023   us  bx/ heart clip/ path pending   BREAST BIOPSY Left 04/20/2023   US  LT BREAST BX W LOC DEV 1ST LESION IMG BX SPEC US  GUIDE 04/20/2023 [facility]   BREAST CYST ASPIRATION Left 2001   Negative   CARDIAC ELECTROPHYSIOLOGY MAPPING AND ABLATION     EYE SURGERY     HUMERUS IM NAIL Right 05/05/2022   Procedure: INTRAMEDULLARY (IM) NAIL HUMERAL;  Surgeon: Kendal Franky SQUIBB, MD;  Location: [facility] OR;  Service: Orthopedics;  Laterality: Right;   HYSTEROTOMY     L4-L5  2020   titaum rebok cage   LEFT HEART  CATH AND CORONARY ANGIOGRAPHY N/A 11/01/2022   Procedure: LEFT HEART CATH AND CORONARY ANGIOGRAPHY and possible PCI and stent;  Surgeon: Fernand Denyse LABOR, MD;  Location: [facility] INVASIVE CV LAB;  Service: Cardiovascular;  Laterality: N/A;   TOTAL HIP ARTHROPLASTY Left      Current Outpatient Medications  Medication Sig Dispense Refill   acetaminophen  (TYLENOL ) 500 MG tablet Take 500 mg by mouth every 6 (six) hours as needed for moderate pain (pain score 4-6) or mild pain (pain score 1-3). Once a day     alendronate  (FOSAMAX ) 70 MG tablet TAKE 1 TABLET WKEELY EVERY THURSDAY. TAKE WITH A FULL GLASS OF WATER ON AN EMPTY STOMACH. 12 tablet 3   allopurinol  (ZYLOPRIM ) 100 MG tablet TAKE 1 TABLET EVERY DAY 90 tablet 0   apixaban  (ELIQUIS ) 5 MG TABS tablet Take 1 tablet (5 mg total) by mouth 2 (two) times daily. 60 tablet 0   azelastine  (ASTELIN ) 0.1 % nasal spray Place 2 sprays into both nostrils at bedtime. Use in each nostril as directed  30 mL 12   cyanocobalamin  (VITAMIN B12) 1000 MCG tablet Take 1,000 mcg by mouth daily. Monday to Friday     diclofenac  Sodium (VOLTAREN ) 1 % GEL Apply 2 g topically 3 (three) times daily.     EPINEPHrine  0.3 mg/0.3 mL IJ SOAJ injection Inject 0.3 mg into the muscle as needed for anaphylaxis.     ferrous sulfate  324 MG TBEC Take 324 mg by mouth 2 (two) times daily.     fluticasone  (FLONASE ) 50 MCG/ACT nasal spray Place 2 sprays into both nostrils daily as needed for allergies or rhinitis.     furosemide  (LASIX ) 20 MG tablet Take 20 mg by mouth 3 (three) times a week. Monday, Wednesday, Friday and PRN for weight gain of 1-2 pounds in one day     gabapentin  (NEURONTIN ) 800 MG tablet TAKE 1 TABLET FOUR TIMES DAILY 360 tablet 3   hydrALAZINE  (APRESOLINE ) 25 MG tablet Take 1 tablet (25 mg total) by mouth 2 times daily at 12 noon and 4 pm. 120 tablet 11   isosorbide  mononitrate (IMDUR ) 60 MG 24 hr tablet TAKE 1 TABLET EVERY DAY 90 tablet 3   levocetirizine (XYZAL ) 5 MG tablet  TAKE 1 TABLET EVERY EVENING 90 tablet 3   losartan  (COZAAR ) 50 MG tablet Take 1 tablet (50 mg total) by mouth 2 (two) times daily. 60 tablet 11   meclizine  (ANTIVERT ) 12.5 MG tablet Take 1 tablet (12.5 mg total) by mouth 3 (three) times daily as needed for dizziness. 30 tablet 0   metoprolol  succinate (TOPROL -XL) 25 MG 24 hr tablet TAKE 1 TABLET EVERY DAY 90 tablet 3   nitroGLYCERIN  (NITROSTAT ) 0.4 MG SL tablet Place 1 tablet (0.4 mg total) under the tongue every 5 (five) minutes as needed for chest pain. 25 tablet 0   OXYGEN  Inhale 2 L/min into the lungs at bedtime. And as needed for SOB     pantoprazole  (PROTONIX ) 40 MG tablet TAKE 1 TABLET EVERY DAY 90 tablet 3   Polyethyl Glyc-Propyl Glyc PF (SYSTANE HYDRATION PF) 0.4-0.3 % SOLN Apply 1 drop to eye daily as needed (dry eyes).     rosuvastatin  (CRESTOR ) 40 MG tablet TAKE 1 TABLET EVERY DAY 90 tablet 3   sertraline  (ZOLOFT ) 25 MG tablet TAKE 1 TABLET THREE TIMES DAILY 180 tablet 5   Vitamin D , Ergocalciferol , 50000 units CAPS Take 1 capsule by mouth once a week. 12 capsule 3   No current facility-administered medications for this visit.    Allergies:   Iodinated contrast media, Ibuprofen, Sulfa antibiotics, Doxycycline, and Tetracyclines & related    Social History:   reports that she has never smoked. She has never used smokeless tobacco. She reports that she does not drink alcohol  and does not use drugs.   Family History:  family history includes Acute myelogenous leukemia in her sister; Breast cancer in an other family member; Cancer in her sister; Colon cancer (age of onset: 33) in her mother; HIV/AIDS (age of onset: 28) in her brother; Melanoma in her brother, brother, niece, and another family member; Prostate cancer in her brother, brother, and father; Tuberculosis in her paternal aunt.    ROS:     Review of Systems  Constitutional: Negative.   HENT: Negative.    Eyes: Negative.   Respiratory: Negative.    Gastrointestinal:  Negative.   Genitourinary: Negative.   Musculoskeletal: Negative.   Skin: Negative.   Neurological: Negative.   Endo/Heme/Allergies: Negative.   Psychiatric/Behavioral: Negative.    All other systems  reviewed and are negative.     All other systems are reviewed and negative.    PHYSICAL EXAM: VS:  BP (!) 200/80 Comment: right arm  left arm 180/70  Pulse 74   Ht 5' 11 (1.803 m)   Wt 195 lb 3.2 oz (88.5 kg)   SpO2 97%   BMI 27.22 kg/m  , BMI Body mass index is 27.22 kg/m. Last weight:  Wt Readings from Last 3 Encounters:  05/17/24 195 lb 3.2 oz (88.5 kg)  05/14/24 193 lb 6.4 oz (87.7 kg)  03/16/24 188 lb (85.3 kg)     Physical Exam Constitutional:      Appearance: Normal appearance.  Cardiovascular:     Rate and Rhythm: Normal rate and regular rhythm.     Heart sounds: Normal heart sounds.  Pulmonary:     Effort: Pulmonary effort is normal.     Breath sounds: Normal breath sounds.  Musculoskeletal:     Right lower leg: No edema.     Left lower leg: No edema.  Neurological:     Mental Status: She is alert.       EKG:   Recent Labs: 12/22/2023: TSH 0.827 02/13/2024: B Natriuretic Peptide 730.3 02/15/2024: Magnesium 2.0 05/10/2024: ALT 10; BUN 16; Creatinine 0.80; Hemoglobin 10.7; Platelet Count 172; Potassium 3.2; Sodium 141    Lipid Panel    Component Value Date/Time   CHOL 131 12/22/2023 1049   TRIG 112 12/22/2023 1049   HDL 58 12/22/2023 1049   CHOLHDL 2.3 12/22/2023 1049   LDLCALC 53 12/22/2023 1049      Other studies Reviewed: Additional studies/ records that were reviewed today include:  Review of the above records demonstrates:       No data to display            ASSESSMENT AND PLAN:    ICD-10-CM   1. SOB (shortness of breath)  R06.02 losartan  (COZAAR ) 50 MG tablet    hydrALAZINE  (APRESOLINE ) 25 MG tablet    2. Other chest pain  R07.89 losartan  (COZAAR ) 50 MG tablet    hydrALAZINE  (APRESOLINE ) 25 MG tablet    3. Mixed  hyperlipidemia  E78.2 losartan  (COZAAR ) 50 MG tablet    hydrALAZINE  (APRESOLINE ) 25 MG tablet    4. Coronary artery disease involving native coronary artery of native heart with refractory angina pectoris (HCC)  I25.112 losartan  (COZAAR ) 50 MG tablet    hydrALAZINE  (APRESOLINE ) 25 MG tablet    5. HTN (hypertension), malignant  I10 losartan  (COZAAR ) 50 MG tablet    hydrALAZINE  (APRESOLINE ) 25 MG tablet   Repeat BP 160/70, increase losartan  to 50 mg daily and take hydralzine 25 bid, butt if BP high take 50 bid       Problem List Items Addressed This Visit       Cardiovascular and Mediastinum   CAD (coronary artery disease)   Relevant Medications   losartan  (COZAAR ) 50 MG tablet   hydrALAZINE  (APRESOLINE ) 25 MG tablet     Other   HLD (hyperlipidemia)   Relevant Medications   losartan  (COZAAR ) 50 MG tablet   hydrALAZINE  (APRESOLINE ) 25 MG tablet   Other Visit Diagnoses       SOB (shortness of breath)    -  Primary   Relevant Medications   losartan  (COZAAR ) 50 MG tablet   hydrALAZINE  (APRESOLINE ) 25 MG tablet     Other chest pain       Relevant Medications   losartan  (COZAAR ) 50 MG tablet   hydrALAZINE  (  APRESOLINE ) 25 MG tablet     HTN (hypertension), malignant       Repeat BP 160/70, increase losartan  to 50 mg daily and take hydralzine 25 bid, butt if BP high take 50 bid   Relevant Medications   losartan  (COZAAR ) 50 MG tablet   hydrALAZINE  (APRESOLINE ) 25 MG tablet          Disposition:   Return in about 2 weeks (around 05/31/2024).    Total time spent: 30 minutes  Signed,  Denyse Bathe, MD  05/17/2024 10:55 AM    [facility]

## 2024-05-18 ENCOUNTER — Telehealth: Payer: Self-pay

## 2024-05-18 NOTE — Telephone Encounter (Signed)
 Patient LM for pt to call back stating someone from here called her

## 2024-05-25 ENCOUNTER — Other Ambulatory Visit: Payer: Self-pay | Admitting: Family

## 2024-05-28 ENCOUNTER — Encounter: Payer: Self-pay | Admitting: Internal Medicine

## 2024-05-30 DIAGNOSIS — I2510 Atherosclerotic heart disease of native coronary artery without angina pectoris: Secondary | ICD-10-CM | POA: Diagnosis not present

## 2024-05-30 DIAGNOSIS — I480 Paroxysmal atrial fibrillation: Secondary | ICD-10-CM | POA: Diagnosis not present

## 2024-05-31 ENCOUNTER — Ambulatory Visit: Admitting: Cardiovascular Disease

## 2024-05-31 ENCOUNTER — Encounter: Payer: Self-pay | Admitting: Cardiovascular Disease

## 2024-05-31 VITALS — BP 140/68 | HR 64 | Ht 71.0 in | Wt 194.6 lb

## 2024-05-31 DIAGNOSIS — R0602 Shortness of breath: Secondary | ICD-10-CM | POA: Diagnosis not present

## 2024-05-31 DIAGNOSIS — I25112 Atherosclerotic heart disease of native coronary artery with refractory angina pectoris: Secondary | ICD-10-CM

## 2024-05-31 DIAGNOSIS — E782 Mixed hyperlipidemia: Secondary | ICD-10-CM | POA: Diagnosis not present

## 2024-05-31 DIAGNOSIS — I10 Essential (primary) hypertension: Secondary | ICD-10-CM | POA: Diagnosis not present

## 2024-05-31 MED ORDER — HYDRALAZINE HCL 50 MG PO TABS: 50.0000 mg | ORAL_TABLET | Freq: Three times a day (TID) | ORAL | 3 refills | Status: DC

## 2024-05-31 NOTE — Progress Notes (Signed)
 Cardiology Office Note   Date:  05/31/2024   ID:  Sarah Riddle, Sarah Riddle 04/06/1941, [name]  PCP:  Fernand Fredy RAMAN, MD  Cardiologist:  Denyse Fernand, MD      History of Present Illness: Sarah Riddle is a 83 y.o. female who presents for  Chief Complaint  Patient presents with  . Follow-up    2 week    Has headaches as having high BP readings at home readings as high as 184/69.      Past Medical History:  Diagnosis Date  . A-fib (HCC)   . Accelerated hypertension 10/31/2022  . Age related osteoporosis   . Cancer (HCC)    Melanoma x 2 -- Lt Leg -1976 Rt Arm - 2014  . Chronic bilateral low back pain   . Chronic kidney disease   . Complication of anesthesia   . DVT (deep venous thrombosis) (HCC) 1960   right leg  was on BC pills  . Dysrhythmia   . Family history of breast cancer   . Family history of colon cancer   . Family history of melanoma   . Family history of prostate cancer   . Fibrocystic breast disease   . GERD (gastroesophageal reflux disease)   . Headache    migraine  . Heart murmur    not heard now.  . History of kidney stones   . Hyperlipidemia   . Hypertension   . IBS (irritable bowel syndrome)   . Lumbago   . Mitral valve prolapse   . Neuropathy   . Obstructive sleep apnea   . Osteoarthritis of shoulder   . PONV (postoperative nausea and vomiting)      Past Surgical History:  Procedure Laterality Date  . ABDOMINAL HYSTERECTOMY    . BACK SURGERY    . BREAST BIOPSY Left 04/20/2023   us  bx/ heart clip/ path pending  . BREAST BIOPSY Left 04/20/2023   US  LT BREAST BX W LOC DEV 1ST LESION IMG BX SPEC US  GUIDE 04/20/2023 [facility]  . BREAST CYST ASPIRATION Left 2001   Negative  . CARDIAC ELECTROPHYSIOLOGY MAPPING AND ABLATION    . EYE SURGERY    . HUMERUS IM NAIL Right 05/05/2022   Procedure: INTRAMEDULLARY (IM) NAIL HUMERAL;  Surgeon: Kendal Franky SQUIBB, MD;  Location: [facility] OR;  Service: Orthopedics;  Laterality: Right;  .  HYSTEROTOMY    . L4-L5  2020   titaum rebok cage  . LEFT HEART CATH AND CORONARY ANGIOGRAPHY N/A 11/01/2022   Procedure: LEFT HEART CATH AND CORONARY ANGIOGRAPHY and possible PCI and stent;  Surgeon: Fernand Denyse LABOR, MD;  Location: [facility];  Service: Cardiovascular;  Laterality: N/A;  . TOTAL HIP ARTHROPLASTY Left      Current Outpatient Medications  Medication Sig Dispense Refill  . acetaminophen  (TYLENOL ) 500 MG tablet Take 500 mg by mouth every 6 (six) hours as needed for moderate pain (pain score 4-6) or mild pain (pain score 1-3). Once a day    . alendronate  (FOSAMAX ) 70 MG tablet TAKE 1 TABLET WKEELY EVERY THURSDAY. TAKE WITH A FULL GLASS OF WATER ON AN EMPTY STOMACH. 12 tablet 3  . allopurinol  (ZYLOPRIM ) 100 MG tablet TAKE 1 TABLET EVERY DAY 90 tablet 0  . apixaban  (ELIQUIS ) 5 MG TABS tablet Take 1 tablet (5 mg total) by mouth 2 (two) times daily. 60 tablet 0  . azelastine  (ASTELIN ) 0.1 % nasal spray Place 2 sprays into both nostrils at bedtime. Use in  each nostril as directed (Patient taking differently: Place 2 sprays into both nostrils as needed. Use in each nostril as directed) 30 mL 12  . cyanocobalamin  (VITAMIN B12) 1000 MCG tablet Take 1,000 mcg by mouth daily. Monday to Friday    . EPINEPHrine  0.3 mg/0.3 mL IJ SOAJ injection Inject 0.3 mg into the muscle as needed for anaphylaxis.    . fluticasone  (FLONASE ) 50 MCG/ACT nasal spray Place 2 sprays into both nostrils daily as needed for allergies or rhinitis.    . furosemide  (LASIX ) 20 MG tablet Take 20 mg by mouth 3 (three) times a week. Monday, Wednesday, Friday and PRN for weight gain of 1-2 pounds in one day    . gabapentin  (NEURONTIN ) 800 MG tablet TAKE 1 TABLET FOUR TIMES DAILY 360 tablet 3  . hydrALAZINE  (APRESOLINE ) 50 MG tablet Take 1 tablet (50 mg total) by mouth 3 (three) times daily. 90 tablet 3  . levocetirizine (XYZAL ) 5 MG tablet TAKE 1 TABLET EVERY EVENING 90 tablet 3  . losartan  (COZAAR ) 50 MG tablet  Take 1 tablet (50 mg total) by mouth 2 (two) times daily. 60 tablet 11  . meclizine  (ANTIVERT ) 12.5 MG tablet Take 1 tablet (12.5 mg total) by mouth 3 (three) times daily as needed for dizziness. 30 tablet 0  . metoprolol  succinate (TOPROL -XL) 25 MG 24 hr tablet TAKE 1 TABLET EVERY DAY 90 tablet 3  . nitroGLYCERIN  (NITROSTAT ) 0.4 MG SL tablet Place 1 tablet (0.4 mg total) under the tongue every 5 (five) minutes as needed for chest pain. 25 tablet 0  . OXYGEN  Inhale 2 L/min into the lungs at bedtime. And as needed for SOB    . pantoprazole  (PROTONIX ) 40 MG tablet TAKE 1 TABLET EVERY DAY 90 tablet 3  . Polyethyl Glyc-Propyl Glyc PF (SYSTANE HYDRATION PF) 0.4-0.3 % SOLN Apply 1 drop to eye daily as needed (dry eyes).    . rosuvastatin  (CRESTOR ) 40 MG tablet TAKE 1 TABLET EVERY DAY 90 tablet 3  . sertraline  (ZOLOFT ) 25 MG tablet TAKE 1 TABLET THREE TIMES DAILY 180 tablet 5  . Vitamin D , Ergocalciferol , 50000 units CAPS Take 1 capsule by mouth once a week. 12 capsule 3   No current facility-administered medications for this visit.    Allergies:   Iodinated contrast media, Ibuprofen, Sulfa antibiotics, Doxycycline, and Tetracyclines & related    Social History:   reports that she has never smoked. She has never used smokeless tobacco. She reports that she does not drink alcohol  and does not use drugs.   Family History:  family history includes Acute myelogenous leukemia in her sister; Breast cancer in an other family member; Cancer in her sister; Colon cancer (age of onset: 62) in her mother; HIV/AIDS (age of onset: 76) in her brother; Melanoma in her brother, brother, niece, and another family member; Prostate cancer in her brother, brother, and father; Tuberculosis in her paternal aunt.    ROS:     Review of Systems  Constitutional: Negative.   HENT: Negative.    Eyes: Negative.   Respiratory: Negative.    Gastrointestinal: Negative.   Genitourinary: Negative.   Musculoskeletal: Negative.    Skin: Negative.   Neurological: Negative.   Endo/Heme/Allergies: Negative.   Psychiatric/Behavioral: Negative.    All other systems reviewed and are negative.     All other systems are reviewed and negative.    PHYSICAL EXAM: VS:  BP (!) 140/68   Pulse 64   Ht 5' 11 (1.803 m)  Wt 194 lb 9.6 oz (88.3 kg)   SpO2 96%   BMI 27.14 kg/m  , BMI Body mass index is 27.14 kg/m. Last weight:  Wt Readings from Last 3 Encounters:  05/31/24 194 lb 9.6 oz (88.3 kg)  05/17/24 195 lb 3.2 oz (88.5 kg)  05/14/24 193 lb 6.4 oz (87.7 kg)     Physical Exam Constitutional:      Appearance: Normal appearance.  Cardiovascular:     Rate and Rhythm: Normal rate and regular rhythm.     Heart sounds: Normal heart sounds.  Pulmonary:     Effort: Pulmonary effort is normal.     Breath sounds: Normal breath sounds.  Musculoskeletal:     Right lower leg: No edema.     Left lower leg: No edema.  Neurological:     Mental Status: She is alert.       EKG:   Recent Labs: 12/22/2023: TSH 0.827 02/13/2024: B Natriuretic Peptide 730.3 02/15/2024: Magnesium 2.0 05/10/2024: ALT 10; BUN 16; Creatinine 0.80; Hemoglobin 10.7; Platelet Count 172; Potassium 3.2; Sodium 141    Lipid Panel    Component Value Date/Time   CHOL 131 12/22/2023 1049   TRIG 112 12/22/2023 1049   HDL 58 12/22/2023 1049   CHOLHDL 2.3 12/22/2023 1049   LDLCALC 53 12/22/2023 1049      Other studies Reviewed: Additional studies/ records that were reviewed today include:  Review of the above records demonstrates:       No data to display            ASSESSMENT AND PLAN:    ICD-10-CM   1. HTN (hypertension), malignant  I10 hydrALAZINE  (APRESOLINE ) 50 MG tablet   change hydralazine  from 25 to 50 tid    2. SOB (shortness of breath)  R06.02 hydrALAZINE  (APRESOLINE ) 50 MG tablet    3. Mixed hyperlipidemia  E78.2 hydrALAZINE  (APRESOLINE ) 50 MG tablet    4. Coronary artery disease involving native coronary artery  of native heart with refractory angina pectoris (HCC)  I25.112 hydrALAZINE  (APRESOLINE ) 50 MG tablet       Problem List Items Addressed This Visit       Cardiovascular and Mediastinum   CAD (coronary artery disease)   Relevant Medications   hydrALAZINE  (APRESOLINE ) 50 MG tablet     Other   HLD (hyperlipidemia)   Relevant Medications   hydrALAZINE  (APRESOLINE ) 50 MG tablet   Other Visit Diagnoses       HTN (hypertension), malignant    -  Primary   change hydralazine  from 25 to 50 tid   Relevant Medications   hydrALAZINE  (APRESOLINE ) 50 MG tablet     SOB (shortness of breath)       Relevant Medications   hydrALAZINE  (APRESOLINE ) 50 MG tablet          Disposition:   Return in about 4 weeks (around 06/28/2024).    Total time spent: 35 minutes  Signed,  Denyse Bathe, MD  05/31/2024 11:39 AM    [facility]

## 2024-06-15 ENCOUNTER — Ambulatory Visit: Admitting: Cardiovascular Disease

## 2024-06-26 ENCOUNTER — Other Ambulatory Visit

## 2024-06-26 DIAGNOSIS — R7301 Impaired fasting glucose: Secondary | ICD-10-CM

## 2024-06-26 DIAGNOSIS — I10 Essential (primary) hypertension: Secondary | ICD-10-CM | POA: Diagnosis not present

## 2024-06-26 DIAGNOSIS — E782 Mixed hyperlipidemia: Secondary | ICD-10-CM | POA: Diagnosis not present

## 2024-06-27 ENCOUNTER — Ambulatory Visit: Payer: Self-pay | Admitting: Internal Medicine

## 2024-06-27 LAB — CMP14+EGFR
ALT: 11 IU/L (ref 0–32)
AST: 15 IU/L (ref 0–40)
Albumin: 3.6 g/dL — ABNORMAL LOW (ref 3.7–4.7)
Alkaline Phosphatase: 55 IU/L (ref 48–129)
BUN/Creatinine Ratio: 24 (ref 12–28)
BUN: 22 mg/dL (ref 8–27)
Bilirubin Total: 0.5 mg/dL (ref 0.0–1.2)
CO2: 26 mmol/L (ref 20–29)
Calcium: 9 mg/dL (ref 8.7–10.3)
Chloride: 108 mmol/L — ABNORMAL HIGH (ref 96–106)
Creatinine, Ser: 0.93 mg/dL (ref 0.57–1.00)
Globulin, Total: 1.7 g/dL (ref 1.5–4.5)
Glucose: 88 mg/dL (ref 70–99)
Potassium: 4.2 mmol/L (ref 3.5–5.2)
Sodium: 144 mmol/L (ref 134–144)
Total Protein: 5.3 g/dL — ABNORMAL LOW (ref 6.0–8.5)
eGFR: 61 mL/min/1.73 (ref >59)

## 2024-06-27 LAB — LIPID PANEL
Chol/HDL Ratio: 2.3 ratio (ref 0.0–4.4)
Cholesterol, Total: 121 mg/dL (ref 100–199)
HDL: 53 mg/dL (ref >39)
LDL Chol Calc (NIH): 50 mg/dL (ref 0–99)
Triglycerides: 93 mg/dL (ref 0–149)
VLDL Cholesterol Cal: 18 mg/dL (ref 5–40)

## 2024-06-27 LAB — HEMOGLOBIN A1C
Est. average glucose Bld gHb Est-mCnc: 108 mg/dL
Hgb A1c MFr Bld: 5.4 % (ref 4.8–5.6)

## 2024-06-27 LAB — TSH: TSH: 0.762 u[IU]/mL (ref 0.450–4.500)

## 2024-06-28 ENCOUNTER — Ambulatory Visit (INDEPENDENT_AMBULATORY_CARE_PROVIDER_SITE_OTHER): Admitting: Internal Medicine

## 2024-06-28 ENCOUNTER — Encounter: Payer: Self-pay | Admitting: Internal Medicine

## 2024-06-28 VITALS — BP 130/62 | HR 62 | Ht 71.0 in | Wt 198.2 lb

## 2024-06-28 DIAGNOSIS — Z85820 Personal history of malignant melanoma of skin: Secondary | ICD-10-CM | POA: Diagnosis not present

## 2024-06-28 DIAGNOSIS — F419 Anxiety disorder, unspecified: Secondary | ICD-10-CM | POA: Insufficient documentation

## 2024-06-28 DIAGNOSIS — L570 Actinic keratosis: Secondary | ICD-10-CM | POA: Diagnosis not present

## 2024-06-28 DIAGNOSIS — D0439 Carcinoma in situ of skin of other parts of face: Secondary | ICD-10-CM | POA: Diagnosis not present

## 2024-06-28 DIAGNOSIS — D0471 Carcinoma in situ of skin of right lower limb, including hip: Secondary | ICD-10-CM | POA: Diagnosis not present

## 2024-06-28 DIAGNOSIS — D2262 Melanocytic nevi of left upper limb, including shoulder: Secondary | ICD-10-CM | POA: Diagnosis not present

## 2024-06-28 DIAGNOSIS — I2510 Atherosclerotic heart disease of native coronary artery without angina pectoris: Secondary | ICD-10-CM | POA: Diagnosis not present

## 2024-06-28 DIAGNOSIS — F321 Major depressive disorder, single episode, moderate: Secondary | ICD-10-CM | POA: Diagnosis not present

## 2024-06-28 DIAGNOSIS — G629 Polyneuropathy, unspecified: Secondary | ICD-10-CM

## 2024-06-28 DIAGNOSIS — I10 Essential (primary) hypertension: Secondary | ICD-10-CM

## 2024-06-28 DIAGNOSIS — C44311 Basal cell carcinoma of skin of nose: Secondary | ICD-10-CM | POA: Diagnosis not present

## 2024-06-28 DIAGNOSIS — Z1331 Encounter for screening for depression: Secondary | ICD-10-CM | POA: Insufficient documentation

## 2024-06-28 DIAGNOSIS — D225 Melanocytic nevi of trunk: Secondary | ICD-10-CM | POA: Diagnosis not present

## 2024-06-28 DIAGNOSIS — D485 Neoplasm of uncertain behavior of skin: Secondary | ICD-10-CM | POA: Diagnosis not present

## 2024-06-28 DIAGNOSIS — D2261 Melanocytic nevi of right upper limb, including shoulder: Secondary | ICD-10-CM | POA: Diagnosis not present

## 2024-06-28 DIAGNOSIS — D2272 Melanocytic nevi of left lower limb, including hip: Secondary | ICD-10-CM | POA: Diagnosis not present

## 2024-06-28 MED ORDER — DULOXETINE HCL 30 MG PO CPEP: 30.0000 mg | ORAL_CAPSULE | Freq: Every day | ORAL | 2 refills | Status: DC

## 2024-06-28 NOTE — Progress Notes (Signed)
 Established Patient Office Visit  Subjective:  Patient ID: Sarah Riddle, female    DOB: 04-12-1941  Age: 83 y.o. MRN: [id number]  Chief Complaint  Patient presents with   Follow-up    4 month follow up    Patient is here today to follow up. She reports she is still having BP readings at home all over the place. States she is taking her medications as prescribed but endorses BP rise in the evenings which may be due to stress. Denies headaches, chest pain, shortness of breath, palpitations at this time.  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 She also reports craving salt periodically and bilateral lower leg edema. She states she wears compression stockings regularly but does not have them on today due to screen check appointment at dermatologist after this. Patient also endorsed 1 episode of feeling light headed while helping her husband bathe that results in a fall last week. States she scratched her arm on the bathroom door frame but otherwise did not get hurt.   Her PHQ-9 score and GAD-7 score were both 9 today. She has been taking Zoloft  25 mg three times a day for years but does not feel as if it is reducing her symptoms. Will switch to Cymbalta  30 mg once daily. Encouraged patient to wean dose of Zoloft  down over the next 4 days prior to starting Cymbalta . She also endorses worsening neuropathy which Cymbalta  may help with as patient is already on Gabapentin  and endorses it makes her feel sleepy.   Other wise patient is doing okay and has no additional complaints.    No other concerns at this time.   Past Medical History:  Diagnosis Date   A-fib [facility])    Accelerated hypertension 10/31/2022   Age related osteoporosis    Cancer (HCC)    Melanoma x 2 -- Lt Leg -1976 Rt Arm - 2014   Chronic bilateral low back pain    Chronic kidney disease    Complication of anesthesia    DVT (deep venous thrombosis) (HCC) 1960    right leg  was on BC pills   Dysrhythmia    Family history of breast cancer    Family history of colon cancer    Family history of melanoma    Family history of prostate cancer    Fibrocystic breast disease    GERD (gastroesophageal reflux disease)    Headache    migraine   Heart murmur    not heard now.   History of kidney stones    Hyperlipidemia    Hypertension    IBS (irritable bowel syndrome)    Lumbago    Mitral valve prolapse    Neuropathy    Obstructive sleep apnea    Osteoarthritis of shoulder    PONV (postoperative nausea and vomiting)     Past Surgical History:  Procedure Laterality Date   ABDOMINAL HYSTERECTOMY     BACK SURGERY     BREAST BIOPSY Left 04/20/2023   us  bx/ heart clip/ path pending   BREAST BIOPSY Left 04/20/2023   US  LT BREAST BX W LOC DEV 1ST LESION IMG BX SPEC US  GUIDE 04/20/2023 [facility]   BREAST CYST ASPIRATION Left 2001   Negative   CARDIAC ELECTROPHYSIOLOGY MAPPING AND ABLATION     EYE SURGERY     HUMERUS IM NAIL Right 05/05/2022   Procedure: INTRAMEDULLARY (IM) NAIL HUMERAL;  Surgeon: Kendal Franky SQUIBB,  MD;  Location: [facility] OR;  Service: Orthopedics;  Laterality: Right;   HYSTEROTOMY     L4-L5  2020   titaum rebok cage   LEFT HEART CATH AND CORONARY ANGIOGRAPHY N/A 11/01/2022   Procedure: LEFT HEART CATH AND CORONARY ANGIOGRAPHY and possible PCI and stent;  Surgeon: Fernand Denyse LABOR, MD;  Location: [facility] INVASIVE CV LAB;  Service: Cardiovascular;  Laterality: N/A;   TOTAL HIP ARTHROPLASTY Left     Social History   Socioeconomic History   Marital status: Married    Spouse name: Not on file   Number of children: Not on file   Years of education: Not on file   Highest education level: Not on file  Occupational History   Not on file  Tobacco Use   Smoking status: Never   Smokeless tobacco: Never  Vaping Use   Vaping status: Never Used  Substance and Sexual Activity   Alcohol  use: No   Drug use: No   Sexual activity: Not  Currently  Other Topics Concern   Not on file  Social History Narrative   Not on file   Social Drivers of Health   Financial Resource Strain: Low Risk  (09/23/2023)   Overall Financial Resource Strain (CARDIA)    Difficulty of Paying Living Expenses: Not hard at all  Food Insecurity: No Food Insecurity (02/16/2024)   Hunger Vital Sign    Worried About Running Out of Food in the Last Year: Never true    Ran Out of Food in the Last Year: Never true  Transportation Needs: No Transportation Needs (02/16/2024)   PRAPARE - Administrator, Civil Service (Medical): No    Lack of Transportation (Non-Medical): No  Physical Activity: Inactive (09/23/2023)   Exercise Vital Sign    Days of Exercise per Week: 0 days    Minutes of Exercise per Session: 0 min  Stress: No Stress Concern Present (09/23/2023)   [employer] of Occupational Health - Occupational Stress Questionnaire    Feeling of Stress : Only a little  Social Connections: Moderately Isolated (02/13/2024)   Social Connection and Isolation Panel    Frequency of Communication with Friends and Family: More than three times a week    Frequency of Social Gatherings with Friends and Family: Twice a week    Attends Religious Services: Never    Database administrator or Organizations: No    Attends Banker Meetings: Never    Marital Status: Married  Catering manager Violence: Not At Risk (02/16/2024)   Humiliation, Afraid, Rape, and Kick questionnaire    Fear of Current or Ex-Partner: No    Emotionally Abused: No    Physically Abused: No    Sexually Abused: No    Family History  Problem Relation Age of Onset   Colon cancer Mother 67   Prostate cancer Father        dx 13s   Cancer Sister    Acute myelogenous leukemia Sister        dx 68s   Prostate cancer Brother    Melanoma Brother    Prostate cancer Brother    Melanoma Brother    HIV/AIDS Brother 9   Tuberculosis Paternal Aunt    Melanoma Other     Breast cancer Other    Melanoma Niece     Allergies  Allergen Reactions   Iodinated Contrast Media Shortness Of Breath and Other (See Comments)    Can be premedicated  Have been able to  tolerate if given premeds (benadryl , prednisone )   Ibuprofen Other (See Comments)    stomach problems    Sulfa Antibiotics Other (See Comments)    Stomach problems  Stomach problems  Stomach problems  Stomach problems   Doxycycline Diarrhea and Nausea And Vomiting   Tetracyclines & Related Nausea Only and Nausea And Vomiting    Outpatient Medications Prior to Visit  Medication Sig   acetaminophen  (TYLENOL ) 500 MG tablet Take 500 mg by mouth every 6 (six) hours as needed for moderate pain (pain score 4-6) or mild pain (pain score 1-3). Once a day   alendronate  (FOSAMAX ) 70 MG tablet TAKE 1 TABLET WKEELY EVERY THURSDAY. TAKE WITH A FULL GLASS OF WATER ON AN EMPTY STOMACH.   allopurinol  (ZYLOPRIM ) 100 MG tablet TAKE 1 TABLET EVERY DAY   apixaban  (ELIQUIS ) 5 MG TABS tablet Take 1 tablet (5 mg total) by mouth 2 (two) times daily.   azelastine  (ASTELIN ) 0.1 % nasal spray Place 2 sprays into both nostrils at bedtime. Use in each nostril as directed (Patient taking differently: Place 2 sprays into both nostrils as needed. Use in each nostril as directed)   cyanocobalamin  (VITAMIN B12) 1000 MCG tablet Take 1,000 mcg by mouth daily. Monday to Friday   EPINEPHrine  0.3 mg/0.3 mL IJ SOAJ injection Inject 0.3 mg into the muscle as needed for anaphylaxis.   fluticasone  (FLONASE ) 50 MCG/ACT nasal spray Place 2 sprays into both nostrils daily as needed for allergies or rhinitis.   furosemide  (LASIX ) 20 MG tablet Take 20 mg by mouth 3 (three) times a week. Monday, Wednesday, Friday and PRN for weight gain of 1-2 pounds in one day   gabapentin  (NEURONTIN ) 800 MG tablet TAKE 1 TABLET FOUR TIMES DAILY   hydrALAZINE  (APRESOLINE ) 50 MG tablet Take 1 tablet (50 mg total) by mouth 3 (three) times daily.    levocetirizine (XYZAL ) 5 MG tablet TAKE 1 TABLET EVERY EVENING   losartan  (COZAAR ) 50 MG tablet Take 1 tablet (50 mg total) by mouth 2 (two) times daily.   meclizine  (ANTIVERT ) 12.5 MG tablet Take 1 tablet (12.5 mg total) by mouth 3 (three) times daily as needed for dizziness.   metoprolol  succinate (TOPROL -XL) 25 MG 24 hr tablet TAKE 1 TABLET EVERY DAY   nitroGLYCERIN  (NITROSTAT ) 0.4 MG SL tablet Place 1 tablet (0.4 mg total) under the tongue every 5 (five) minutes as needed for chest pain.   OXYGEN  Inhale 2 L/min into the lungs at bedtime. And as needed for SOB   pantoprazole  (PROTONIX ) 40 MG tablet TAKE 1 TABLET EVERY DAY   Polyethyl Glyc-Propyl Glyc PF (SYSTANE HYDRATION PF) 0.4-0.3 % SOLN Apply 1 drop to eye daily as needed (dry eyes).   rosuvastatin  (CRESTOR ) 40 MG tablet TAKE 1 TABLET EVERY DAY   Vitamin D , Ergocalciferol , 50000 units CAPS Take 1 capsule by mouth once a week.   [DISCONTINUED] sertraline  (ZOLOFT ) 25 MG tablet TAKE 1 TABLET THREE TIMES DAILY   No facility-administered medications prior to visit.    Review of Systems  Constitutional: Negative.  Negative for chills, fever and malaise/fatigue.  HENT: Negative.  Negative for congestion and sore throat.   Eyes: Negative.  Negative for blurred vision and pain.  Respiratory: Negative.  Negative for cough and shortness of breath.   Cardiovascular: Negative.  Negative for chest pain, palpitations and leg swelling.  Gastrointestinal: Negative.  Negative for abdominal pain, blood in stool, constipation, diarrhea, heartburn, melena, nausea and vomiting.  Genitourinary: Negative.  Negative for dysuria, flank  pain, frequency and urgency.  Musculoskeletal: Negative.  Negative for joint pain and myalgias.  Skin: Negative.   Neurological:  Positive for sensory change. Negative for dizziness, tingling, weakness and headaches.  Endo/Heme/Allergies: Negative.   Psychiatric/Behavioral:  Positive for depression. Negative for suicidal  ideas. The patient is nervous/anxious.        Objective:   BP 130/62   Pulse 62   Ht 5' 11 (1.803 m)   Wt 198 lb 3.2 oz (89.9 kg)   SpO2 95%   BMI 27.64 kg/m   Vitals:   06/28/24 1019  BP: 130/62  Pulse: 62  Height: 5' 11 (1.803 m)  Weight: 198 lb 3.2 oz (89.9 kg)  SpO2: 95%  BMI (Calculated): 27.66    Physical Exam Vitals and nursing note reviewed.  Constitutional:      Appearance: Normal appearance.  HENT:     Head: Normocephalic and atraumatic.     Nose: Nose normal.     Mouth/Throat:     Mouth: Mucous membranes are moist.     Pharynx: Oropharynx is clear.  Eyes:     Conjunctiva/sclera: Conjunctivae normal.     Pupils: Pupils are equal, round, and reactive to light.  Cardiovascular:     Rate and Rhythm: Normal rate and regular rhythm.     Pulses: Normal pulses.     Heart sounds: Murmur heard.  Pulmonary:     Effort: Pulmonary effort is normal.     Breath sounds: Normal breath sounds. No wheezing.  Abdominal:     General: Bowel sounds are normal.     Palpations: Abdomen is soft.     Tenderness: There is no abdominal tenderness. There is no right CVA tenderness or left CVA tenderness.  Musculoskeletal:        General: Normal range of motion.     Cervical back: Normal range of motion.     Right lower leg: No edema.     Left lower leg: No edema.  Skin:    General: Skin is warm and dry.  Neurological:     General: No focal deficit present.     Mental Status: She is alert and oriented to person, place, and time.  Psychiatric:        Mood and Affect: Mood normal.        Behavior: Behavior normal.      No results found for any visits on 06/28/24.  Recent Results (from the past 2160 hours)  Vitamin B12     Status: None   Collection Time: 05/10/24  8:46 AM  Result Value Ref Range   Vitamin B-12 379 180 - 914 pg/mL    Comment: (NOTE) This assay is not validated for testing neonatal or myeloproliferative syndrome specimens for Vitamin B12  levels. Performed at [facility], [address], [place], [place] [address]   Ferritin     Status: None   Collection Time: 05/10/24  8:47 AM  Result Value Ref Range   Ferritin 17 11 - 307 ng/mL    Comment: Performed at [facility], [address] [place], [place] [address]  Iron and TIBC     Status: None   Collection Time: 05/10/24  8:47 AM  Result Value Ref Range   Iron 89 28 - 170 ug/dL   TIBC 709 749 - 549 ug/dL   Saturation Ratios 31 10.4 - 31.8 %   UIBC 201 ug/dL    Comment: Performed at [facility], [address]  [place], [place] [address]  CBC with Differential (Cancer Center Only)     Status: Abnormal   Collection Time: 05/10/24  8:47 AM  Result Value Ref Range   WBC Count 4.8 4.0 - 10.5 K/uL   RBC 3.68 (L) 3.87 - 5.11 MIL/uL   Hemoglobin 10.7 (L) 12.0 - 15.0 g/dL   HCT 66.2 (L) 63.9 - 53.9 %   MCV 91.6 80.0 - 100.0 fL   MCH 29.1 26.0 - 34.0 pg   MCHC 31.8 30.0 - 36.0 g/dL   RDW 86.3 88.4 - 84.4 %   Platelet Count 172 150 - 400 K/uL   nRBC 0.0 0.0 - 0.2 %   Neutrophils Relative % 47 %   Neutro Abs 2.3 1.7 - 7.7 K/uL   Lymphocytes Relative 40 %   Lymphs Abs 1.9 0.7 - 4.0 K/uL   Monocytes Relative 7 %   Monocytes Absolute 0.3 0.1 - 1.0 K/uL   Eosinophils Relative 5 %   Eosinophils Absolute 0.2 0.0 - 0.5 K/uL   Basophils Relative 1 %   Basophils Absolute 0.1 0.0 - 0.1 K/uL   Immature Granulocytes 0 %   Abs Immature Granulocytes 0.01 0.00 - 0.07 K/uL    Comment: Performed at [facility], [address] [place], [place] [address]  CMP (Cancer Center only)     Status: Abnormal   Collection Time: 05/10/24  8:47 AM  Result Value Ref Range   Sodium 141 135 - 145 mmol/L   Potassium 3.2 (L) 3.5 - 5.1 mmol/L   Chloride 109 98 - 111 mmol/L   CO2 26 22 - 32 mmol/L   Glucose, Bld 94 70 - 99 mg/dL    Comment: Glucose reference range applies only to samples taken after fasting for at least 8 hours.   BUN 16 8 - 23 mg/dL    Creatinine 9.19 9.55 - 1.00 mg/dL   Calcium  9.4 8.9 - 10.3 mg/dL   Total Protein 6.1 (L) 6.5 - 8.1 g/dL   Albumin 3.5 3.5 - 5.0 g/dL   AST 19 15 - 41 U/L   ALT 10 0 - 44 U/L   Alkaline Phosphatase 59 38 - 126 U/L   Total Bilirubin 0.6 0.0 - 1.2 mg/dL   GFR, Estimated >39 >39 mL/min    Comment: (NOTE) Calculated using the CKD-EPI Creatinine Equation (2021)    Anion gap 6 5 - 15    Comment: Performed at [facility], [address] [place], [place] [address]  Lipid panel     Status: None   Collection Time: 06/26/24  8:39 AM  Result Value Ref Range   Cholesterol, Total 121 100 - 199 mg/dL   Triglycerides 93 0 - 149 mg/dL   HDL 53 >60 mg/dL   VLDL Cholesterol Cal 18 5 - 40 mg/dL   LDL Chol Calc (NIH) 50 0 - 99 mg/dL   Chol/HDL Ratio 2.3 0.0 - 4.4 ratio    Comment:                                   T. Chol/HDL Ratio                                             Men  Women  1/2 Avg.Risk  3.4    3.3                                   Avg.Risk  5.0    4.4                                2X Avg.Risk  9.6    7.1                                3X Avg.Risk 23.4   11.0   CMP14+EGFR     Status: Abnormal   Collection Time: 06/26/24  8:39 AM  Result Value Ref Range   Glucose 88 70 - 99 mg/dL   BUN 22 8 - 27 mg/dL   Creatinine, Ser 9.06 0.57 - 1.00 mg/dL   eGFR 61 >40 fO/fpw/8.26   BUN/Creatinine Ratio 24 12 - 28   Sodium 144 134 - 144 mmol/L   Potassium 4.2 3.5 - 5.2 mmol/L   Chloride 108 (H) 96 - 106 mmol/L   CO2 26 20 - 29 mmol/L   Calcium  9.0 8.7 - 10.3 mg/dL   Total Protein 5.3 (L) 6.0 - 8.5 g/dL   Albumin 3.6 (L) 3.7 - 4.7 g/dL   Globulin, Total 1.7 1.5 - 4.5 g/dL   Bilirubin Total 0.5 0.0 - 1.2 mg/dL   Alkaline Phosphatase 55 48 - 129 IU/L    Comment:               **Please note reference interval change**   AST 15 0 - 40 IU/L   ALT 11 0 - 32 IU/L  TSH     Status: None   Collection Time: 06/26/24  8:39 AM  Result Value Ref Range   TSH 0.762  0.450 - 4.500 uIU/mL  Hemoglobin A1c     Status: None   Collection Time: 06/26/24  8:39 AM  Result Value Ref Range   Hgb A1c MFr Bld 5.4 4.8 - 5.6 %    Comment:          Prediabetes: 5.7 - 6.4          Diabetes: >6.4          Glycemic control for adults with diabetes: <7.0    Est. average glucose Bld gHb Est-mCnc 108 mg/dL      Assessment & Plan:  Stop taking Zoloft  by reducing dose over the next 4 days. Start taking Cymbalta  30 mg once daily. Continue taking other medications as prescribed. Keep upcoming specialist appointments as recommended. Problem List Items Addressed This Visit     Hypertension   Neuropathy (Chronic)   CAD (coronary artery disease)   Depression - Primary   Relevant Medications   DULoxetine  (CYMBALTA ) 30 MG capsule (Start on 07/03/2024)   Standardized adult depression screening tool completed   Anxiety   Relevant Medications   DULoxetine  (CYMBALTA ) 30 MG capsule (Start on 07/03/2024)    Return in about 3 weeks (around 07/19/2024).   Total time spent: 25 minutes  FERNAND FREDY RAMAN, MD  06/28/2024   This document may have been prepared by [facility] Voice Recognition software and as such may include unintentional dictation errors.

## 2024-06-30 DIAGNOSIS — I480 Paroxysmal atrial fibrillation: Secondary | ICD-10-CM | POA: Diagnosis not present

## 2024-06-30 DIAGNOSIS — I2510 Atherosclerotic heart disease of native coronary artery without angina pectoris: Secondary | ICD-10-CM | POA: Diagnosis not present

## 2024-07-04 ENCOUNTER — Other Ambulatory Visit: Payer: Self-pay | Admitting: Cardiovascular Disease

## 2024-07-06 ENCOUNTER — Encounter: Payer: Self-pay | Admitting: Cardiovascular Disease

## 2024-07-06 ENCOUNTER — Ambulatory Visit: Admitting: Cardiovascular Disease

## 2024-07-06 VITALS — BP 130/50 | HR 71 | Ht 71.0 in | Wt 195.8 lb

## 2024-07-06 DIAGNOSIS — I5033 Acute on chronic diastolic (congestive) heart failure: Secondary | ICD-10-CM | POA: Diagnosis not present

## 2024-07-06 DIAGNOSIS — I10 Essential (primary) hypertension: Secondary | ICD-10-CM

## 2024-07-06 DIAGNOSIS — I25118 Atherosclerotic heart disease of native coronary artery with other forms of angina pectoris: Secondary | ICD-10-CM

## 2024-07-06 DIAGNOSIS — G4733 Obstructive sleep apnea (adult) (pediatric): Secondary | ICD-10-CM | POA: Diagnosis not present

## 2024-07-06 DIAGNOSIS — I480 Paroxysmal atrial fibrillation: Secondary | ICD-10-CM | POA: Diagnosis not present

## 2024-07-06 DIAGNOSIS — Z131 Encounter for screening for diabetes mellitus: Secondary | ICD-10-CM

## 2024-07-06 MED ORDER — LOSARTAN POTASSIUM 100 MG PO TABS: 100.0000 mg | ORAL_TABLET | Freq: Every day | ORAL | 11 refills | Status: AC

## 2024-07-06 NOTE — Progress Notes (Signed)
 Cardiology Office Note   Date:  07/06/2024   ID:  Sarah, Riddle March 29, 1941, [name]  PCP:  Sarah Fredy RAMAN, MD  Cardiologist:  Sarah Fernand, MD      History of Present Illness: Sarah Riddle is a 83 y.o. female who presents for  Chief Complaint  Patient presents with   Follow-up    4 week follow up    Has elevated BP at home 181/74, but heart rate 60-80. Has headaches. But BP here is normal.      Past Medical History:  Diagnosis Date   A-fib (HCC)    Accelerated hypertension 10/31/2022   Age related osteoporosis    Cancer (HCC)    Melanoma x 2 -- Lt Leg -1976 Rt Arm - 2014   Chronic bilateral low back pain    Chronic kidney disease    Complication of anesthesia    DVT (deep venous thrombosis) (HCC) 1960   right leg  was on BC pills   Dysrhythmia    Family history of breast cancer    Family history of colon cancer    Family history of melanoma    Family history of prostate cancer    Fibrocystic breast disease    GERD (gastroesophageal reflux disease)    Headache    migraine   Heart murmur    not heard now.   History of kidney stones    Hyperlipidemia    Hypertension    IBS (irritable bowel syndrome)    Lumbago    Mitral valve prolapse    Neuropathy    Obstructive sleep apnea    Osteoarthritis of shoulder    PONV (postoperative nausea and vomiting)      Past Surgical History:  Procedure Laterality Date   ABDOMINAL HYSTERECTOMY     BACK SURGERY     BREAST BIOPSY Left 04/20/2023   us  bx/ heart clip/ path pending   BREAST BIOPSY Left 04/20/2023   US  LT BREAST BX W LOC DEV 1ST LESION IMG BX SPEC US  GUIDE 04/20/2023 [facility]   BREAST CYST ASPIRATION Left 2001   Negative   CARDIAC ELECTROPHYSIOLOGY MAPPING AND ABLATION     EYE SURGERY     HUMERUS IM NAIL Right 05/05/2022   Procedure: INTRAMEDULLARY (IM) NAIL HUMERAL;  Surgeon: Sarah Franky SQUIBB, MD;  Location: [facility] OR;  Service: Orthopedics;  Laterality: Right;   HYSTEROTOMY      L4-L5  2020   titaum rebok cage   LEFT HEART CATH AND CORONARY ANGIOGRAPHY N/A 11/01/2022   Procedure: LEFT HEART CATH AND CORONARY ANGIOGRAPHY and possible PCI and stent;  Surgeon: Sarah Sarah LABOR, MD;  Location: [facility];  Service: Cardiovascular;  Laterality: N/A;   TOTAL HIP ARTHROPLASTY Left      Current Outpatient Medications  Medication Sig Dispense Refill   acetaminophen  (TYLENOL ) 500 MG tablet Take 500 mg by mouth every 6 (six) hours as needed for moderate pain (pain score 4-6) or mild pain (pain score 1-3). Once a day     alendronate  (FOSAMAX ) 70 MG tablet TAKE 1 TABLET WKEELY EVERY THURSDAY. TAKE WITH A FULL GLASS OF WATER ON AN EMPTY STOMACH. 12 tablet 3   allopurinol  (ZYLOPRIM ) 100 MG tablet TAKE 1 TABLET EVERY DAY 90 tablet 0   apixaban  (ELIQUIS ) 5 MG TABS tablet Take 1 tablet (5 mg total) by mouth 2 (two) times daily. 60 tablet 0   azelastine  (ASTELIN ) 0.1 % nasal spray Place 2 sprays into both  nostrils at bedtime. Use in each nostril as directed (Patient taking differently: Place 2 sprays into both nostrils as needed. Use in each nostril as directed) 30 mL 12   cyanocobalamin  (VITAMIN B12) 1000 MCG tablet Take 1,000 mcg by mouth daily. Monday to Friday     DULoxetine  (CYMBALTA ) 30 MG capsule Take 1 capsule (30 mg total) by mouth daily. 30 capsule 2   EPINEPHrine  0.3 mg/0.3 mL IJ SOAJ injection Inject 0.3 mg into the muscle as needed for anaphylaxis.     fluticasone  (FLONASE ) 50 MCG/ACT nasal spray Place 2 sprays into both nostrils daily as needed for allergies or rhinitis.     furosemide  (LASIX ) 20 MG tablet Take 20 mg by mouth 3 (three) times a week. Monday, Wednesday, Friday and PRN for weight gain of 1-2 pounds in one day     gabapentin  (NEURONTIN ) 800 MG tablet TAKE 1 TABLET FOUR TIMES DAILY 360 tablet 3   hydrALAZINE  (APRESOLINE ) 50 MG tablet Take 1 tablet (50 mg total) by mouth 3 (three) times daily. 90 tablet 3   levocetirizine (XYZAL ) 5 MG tablet TAKE 1  TABLET EVERY EVENING 90 tablet 3   losartan  (COZAAR ) 100 MG tablet Take 1 tablet (100 mg total) by mouth daily. 30 tablet 11   meclizine  (ANTIVERT ) 12.5 MG tablet Take 1 tablet (12.5 mg total) by mouth 3 (three) times daily as needed for dizziness. 30 tablet 0   metoprolol  succinate (TOPROL -XL) 25 MG 24 hr tablet TAKE 1 TABLET EVERY DAY 90 tablet 3   nitroGLYCERIN  (NITROSTAT ) 0.4 MG SL tablet Place 1 tablet (0.4 mg total) under the tongue every 5 (five) minutes as needed for chest pain. 25 tablet 0   OXYGEN  Inhale 2 L/min into the lungs at bedtime. And as needed for SOB     pantoprazole  (PROTONIX ) 40 MG tablet TAKE 1 TABLET EVERY DAY 90 tablet 3   Polyethyl Glyc-Propyl Glyc PF (SYSTANE HYDRATION PF) 0.4-0.3 % SOLN Apply 1 drop to eye daily as needed (dry eyes).     rosuvastatin  (CRESTOR ) 40 MG tablet TAKE 1 TABLET EVERY DAY 90 tablet 3   Vitamin D , Ergocalciferol , 50000 units CAPS Take 1 capsule by mouth once a week. 12 capsule 3   No current facility-administered medications for this visit.    Allergies:   Iodinated contrast media, Ibuprofen, Sulfa antibiotics, Doxycycline, and Tetracyclines & related    Social History:   reports that she has never smoked. She has never used smokeless tobacco. She reports that she does not drink alcohol  and does not use drugs.   Family History:  family history includes Acute myelogenous leukemia in her sister; Breast cancer in an other family member; Cancer in her sister; Colon cancer (age of onset: 81) in her mother; HIV/AIDS (age of onset: 32) in her brother; Melanoma in her brother, brother, niece, and another family member; Prostate cancer in her brother, brother, and father; Tuberculosis in her paternal aunt.    ROS:     Review of Systems  Constitutional: Negative.   HENT: Negative.    Eyes: Negative.   Respiratory: Negative.    Gastrointestinal: Negative.   Genitourinary: Negative.   Musculoskeletal: Negative.   Skin: Negative.   Neurological:  Negative.   Endo/Heme/Allergies: Negative.   Psychiatric/Behavioral: Negative.    All other systems reviewed and are negative.     All other systems are reviewed and negative.    PHYSICAL EXAM: VS:  BP (!) 130/50   Pulse 71   Ht 5'  11 (1.803 m)   Wt 195 lb 12.8 oz (88.8 kg)   SpO2 97%   BMI 27.31 kg/m  , BMI Body mass index is 27.31 kg/m. Last weight:  Wt Readings from Last 3 Encounters:  07/06/24 195 lb 12.8 oz (88.8 kg)  06/28/24 198 lb 3.2 oz (89.9 kg)  05/31/24 194 lb 9.6 oz (88.3 kg)     Physical Exam Constitutional:      Appearance: Normal appearance.  Cardiovascular:     Rate and Rhythm: Normal rate and regular rhythm.     Heart sounds: Normal heart sounds.  Pulmonary:     Effort: Pulmonary effort is normal.     Breath sounds: Normal breath sounds.  Musculoskeletal:     Right lower leg: No edema.     Left lower leg: No edema.  Neurological:     Mental Status: She is alert.       EKG:   Recent Labs: 02/13/2024: B Natriuretic Peptide 730.3 02/15/2024: Magnesium 2.0 05/10/2024: Hemoglobin 10.7; Platelet Count 172 06/26/2024: ALT 11; BUN 22; Creatinine, Ser 0.93; Potassium 4.2; Sodium 144; TSH 0.762    Lipid Panel    Component Value Date/Time   CHOL 121 06/26/2024 0839   TRIG 93 06/26/2024 0839   HDL 53 06/26/2024 0839   CHOLHDL 2.3 06/26/2024 0839   LDLCALC 50 06/26/2024 0839      Other studies Reviewed: Additional studies/ records that were reviewed today include:  Review of the above records demonstrates:       No data to display            ASSESSMENT AND PLAN:    ICD-10-CM   1. Acute on chronic diastolic CHF (congestive heart failure) (HCC)  I50.33 losartan  (COZAAR ) 100 MG tablet    2. Coronary artery disease involving native coronary artery of native heart with other form of angina pectoris  I25.118 losartan  (COZAAR ) 100 MG tablet    3. Primary hypertension  I10 losartan  (COZAAR ) 100 MG tablet   change losartan  fro 50 to 100 mg  daily. Also take hydralzine 100 mg tid if BP over 160 syatolic, other wise 50 tid.    4. PAF with history of ablation (paroxysmal atrial fibrillation) (HCC)  I48.0 losartan  (COZAAR ) 100 MG tablet    5. OSA on CPAP  G47.33 losartan  (COZAAR ) 100 MG tablet       Problem List Items Addressed This Visit       Cardiovascular and Mediastinum   Hypertension   Relevant Medications   losartan  (COZAAR ) 100 MG tablet   PAF with history of ablation (paroxysmal atrial fibrillation) (HCC)   Relevant Medications   losartan  (COZAAR ) 100 MG tablet   CAD (coronary artery disease)   Relevant Medications   losartan  (COZAAR ) 100 MG tablet   Acute on chronic diastolic CHF (congestive heart failure) (HCC) - Primary   Relevant Medications   losartan  (COZAAR ) 100 MG tablet     Respiratory   OSA on CPAP   Relevant Medications   losartan  (COZAAR ) 100 MG tablet       Disposition:   Return in about 4 weeks (around 08/03/2024).    Total time spent: 30 minutes  Signed,  Sarah Bathe, MD  07/06/2024 10:57 AM    [facility]

## 2024-07-19 ENCOUNTER — Encounter: Payer: Self-pay | Admitting: Internal Medicine

## 2024-07-19 ENCOUNTER — Ambulatory Visit (INDEPENDENT_AMBULATORY_CARE_PROVIDER_SITE_OTHER): Admitting: Internal Medicine

## 2024-07-19 ENCOUNTER — Ambulatory Visit
Admission: RE | Admit: 2024-07-19 | Discharge: 2024-07-19 | Disposition: A | Discharge status: Home / Self Care | Source: Ambulatory Visit | Attending: Internal Medicine | Admitting: Internal Medicine

## 2024-07-19 ENCOUNTER — Ambulatory Visit
Admission: RE | Admit: 2024-07-19 | Discharge: 2024-07-19 | Disposition: A | Discharge status: Home / Self Care | Attending: Internal Medicine | Admitting: Internal Medicine

## 2024-07-19 VITALS — BP 118/52 | HR 65 | Ht 71.0 in | Wt 193.0 lb

## 2024-07-19 DIAGNOSIS — S42301D Unspecified fracture of shaft of humerus, right arm, subsequent encounter for fracture with routine healing: Secondary | ICD-10-CM | POA: Diagnosis not present

## 2024-07-19 DIAGNOSIS — Z23 Encounter for immunization: Secondary | ICD-10-CM | POA: Diagnosis not present

## 2024-07-19 DIAGNOSIS — F331 Major depressive disorder, recurrent, moderate: Secondary | ICD-10-CM | POA: Diagnosis not present

## 2024-07-19 DIAGNOSIS — I10 Essential (primary) hypertension: Secondary | ICD-10-CM | POA: Diagnosis not present

## 2024-07-19 DIAGNOSIS — M25511 Pain in right shoulder: Secondary | ICD-10-CM

## 2024-07-19 DIAGNOSIS — J301 Allergic rhinitis due to pollen: Secondary | ICD-10-CM

## 2024-07-19 DIAGNOSIS — Z13820 Encounter for screening for osteoporosis: Secondary | ICD-10-CM | POA: Diagnosis not present

## 2024-07-19 DIAGNOSIS — M19011 Primary osteoarthritis, right shoulder: Secondary | ICD-10-CM | POA: Diagnosis not present

## 2024-07-19 DIAGNOSIS — Z131 Encounter for screening for diabetes mellitus: Secondary | ICD-10-CM

## 2024-07-19 MED ORDER — FLUTICASONE PROPIONATE 50 MCG/ACT NA SUSP: 2.0000 | Freq: Every day | NASAL | 3 refills | Status: AC | PRN

## 2024-07-19 NOTE — Progress Notes (Signed)
 Established Patient Office Visit  Subjective:  Patient ID: Sarah Riddle, female    DOB: [birth year]  Age: 83 y.o. MRN: [id number]  Chief Complaint  Patient presents with   Follow-up    3 week follow up    Patient comes in for her follow-up today.  At her last visit she was advised to taper off the Zoloft  and start Cymbalta .  She is currently take Cymbalta  30 mg/day and is an improvement in her stress level.  She also admits that she is not managing as before so has been able to lose a few pounds.  Patient will continue at the current dose, will increase to 60 mg later on if needed. Her blood pressure is on the low side today, and she is only taking her losartan  so far.  Patient monitors her blood pressure closely and will take other medications if it goes up.  She is not complaining of any headache or dizziness, no nausea vomiting, no chest pain and no palpitations. However she mentions pain in her right shoulder.  The shoulder was repaired in 2023 due to traumatic fracture.  Since then it has been a little sore but her discomfort increased after she accidentally fell on her right elbow last year.  Her x-ray was negative at that time.  She has reduced range of motion in the right shoulder.  Will repeat x-ray  today, if negative, patient may benefit from physical therapy for adhesive capsulitis.    No other concerns at this time.   Past Medical History:  Diagnosis Date   A-fib [facility])    Accelerated hypertension 10/31/2022   Age related osteoporosis    Cancer (HCC)    Melanoma x 2 -- Lt Leg -1976 Rt Arm - 2014   Chronic bilateral low back pain    Chronic kidney disease    Complication of anesthesia    DVT (deep venous thrombosis) (HCC) 1960   right leg  was on BC pills   Dysrhythmia    Family history of breast cancer    Family history of colon cancer    Family history of melanoma    Family history of prostate cancer    Fibrocystic breast disease    GERD (gastroesophageal  reflux disease)    Headache    migraine   Heart murmur    not heard now.   History of kidney stones    Hyperlipidemia    Hypertension    IBS (irritable bowel syndrome)    Lumbago    Mitral valve prolapse    Neuropathy    Obstructive sleep apnea    Osteoarthritis of shoulder    PONV (postoperative nausea and vomiting)     Past Surgical History:  Procedure Laterality Date   ABDOMINAL HYSTERECTOMY     BACK SURGERY     BREAST BIOPSY Left 04/20/2023   us  bx/ heart clip/ path pending   BREAST BIOPSY Left 04/20/2023   US  LT BREAST BX W LOC DEV 1ST LESION IMG BX SPEC US  GUIDE 04/20/2023 [facility]   BREAST CYST ASPIRATION Left 2001   Negative   CARDIAC ELECTROPHYSIOLOGY MAPPING AND ABLATION     EYE SURGERY     HUMERUS IM NAIL Right 05/05/2022   Procedure: INTRAMEDULLARY (IM) NAIL HUMERAL;  Surgeon: Kendal Franky SQUIBB, MD;  Location: [facility] OR;  Service: Orthopedics;  Laterality: Right;   HYSTEROTOMY     L4-L5  2020   titaum rebok cage   LEFT HEART CATH AND CORONARY ANGIOGRAPHY  N/A 11/01/2022   Procedure: LEFT HEART CATH AND CORONARY ANGIOGRAPHY and possible PCI and stent;  Surgeon: Fernand Denyse LABOR, MD;  Location: [facility] INVASIVE CV LAB;  Service: Cardiovascular;  Laterality: N/A;   TOTAL HIP ARTHROPLASTY Left     Social History   Socioeconomic History   Marital status: Married    Spouse name: Not on file   Number of children: Not on file   Years of education: Not on file   Highest education level: Not on file  Occupational History   Not on file  Tobacco Use   Smoking status: Never   Smokeless tobacco: Never  Vaping Use   Vaping status: Never Used  Substance and Sexual Activity   Alcohol  use: No   Drug use: No   Sexual activity: Not Currently  Other Topics Concern   Not on file  Social History Narrative   Not on file   Social Drivers of Health   Financial Resource Strain: Low Risk  (09/23/2023)   Overall Financial Resource Strain (CARDIA)    Difficulty of Paying  Living Expenses: Not hard at all  Food Insecurity: No Food Insecurity (02/16/2024)   Hunger Vital Sign    Worried About Running Out of Food in the Last Year: Never true    Ran Out of Food in the Last Year: Never true  Transportation Needs: No Transportation Needs (02/16/2024)   PRAPARE - Administrator, Civil Service (Medical): No    Lack of Transportation (Non-Medical): No  Physical Activity: Inactive (09/23/2023)   Exercise Vital Sign    Days of Exercise per Week: 0 days    Minutes of Exercise per Session: 0 min  Stress: No Stress Concern Present (09/23/2023)   [employer] of Occupational Health - Occupational Stress Questionnaire    Feeling of Stress : Only a little  Social Connections: Moderately Isolated (02/13/2024)   Social Connection and Isolation Panel    Frequency of Communication with Friends and Family: More than three times a week    Frequency of Social Gatherings with Friends and Family: Twice a week    Attends Religious Services: Never    Database administrator or Organizations: No    Attends Banker Meetings: Never    Marital Status: Married  Catering manager Violence: Not At Risk (02/16/2024)   Humiliation, Afraid, Rape, and Kick questionnaire    Fear of Current or Ex-Partner: No    Emotionally Abused: No    Physically Abused: No    Sexually Abused: No    Family History  Problem Relation Age of Onset   Colon cancer Mother 71   Prostate cancer Father        dx 69s   Cancer Sister    Acute myelogenous leukemia Sister        dx 44s   Prostate cancer Brother    Melanoma Brother    Prostate cancer Brother    Melanoma Brother    HIV/AIDS Brother 62   Tuberculosis Paternal Aunt    Melanoma Other    Breast cancer Other    Melanoma Niece     Allergies  Allergen Reactions   Iodinated Contrast Media Shortness Of Breath and Other (See Comments)    Can be premedicated  Have been able to tolerate if given premeds (benadryl ,  prednisone )   Ibuprofen Other (See Comments)    stomach problems    Sulfa Antibiotics Other (See Comments)    Stomach problems  Stomach problems  Stomach problems  Stomach problems   Doxycycline Diarrhea and Nausea And Vomiting   Tetracyclines & Related Nausea Only and Nausea And Vomiting    Outpatient Medications Prior to Visit  Medication Sig   acetaminophen  (TYLENOL ) 500 MG tablet Take 500 mg by mouth every 6 (six) hours as needed for moderate pain (pain score 4-6) or mild pain (pain score 1-3). Once a day   alendronate  (FOSAMAX ) 70 MG tablet TAKE 1 TABLET WKEELY EVERY THURSDAY. TAKE WITH A FULL GLASS OF WATER ON AN EMPTY STOMACH.   allopurinol  (ZYLOPRIM ) 100 MG tablet TAKE 1 TABLET EVERY DAY   apixaban  (ELIQUIS ) 5 MG TABS tablet Take 1 tablet (5 mg total) by mouth 2 (two) times daily.   azelastine  (ASTELIN ) 0.1 % nasal spray Place 2 sprays into both nostrils at bedtime. Use in each nostril as directed (Patient taking differently: Place 2 sprays into both nostrils as needed for allergies. Use in each nostril as directed)   cyanocobalamin  (VITAMIN B12) 1000 MCG tablet Take 1,000 mcg by mouth daily. Monday to Friday   DULoxetine  (CYMBALTA ) 30 MG capsule Take 1 capsule (30 mg total) by mouth daily.   EPINEPHrine  0.3 mg/0.3 mL IJ SOAJ injection Inject 0.3 mg into the muscle as needed for anaphylaxis.   furosemide  (LASIX ) 20 MG tablet Take 20 mg by mouth 3 (three) times a week. Monday, Wednesday, Friday and PRN for weight gain of 1-2 pounds in one day   gabapentin  (NEURONTIN ) 800 MG tablet TAKE 1 TABLET FOUR TIMES DAILY   hydrALAZINE  (APRESOLINE ) 50 MG tablet Take 1 tablet (50 mg total) by mouth 3 (three) times daily.   levocetirizine (XYZAL ) 5 MG tablet TAKE 1 TABLET EVERY EVENING   losartan  (COZAAR ) 100 MG tablet Take 1 tablet (100 mg total) by mouth daily.   meclizine  (ANTIVERT ) 12.5 MG tablet Take 1 tablet (12.5 mg total) by mouth 3 (three) times daily as needed for  dizziness.   metoprolol  succinate (TOPROL -XL) 25 MG 24 hr tablet TAKE 1 TABLET EVERY DAY   nitroGLYCERIN  (NITROSTAT ) 0.4 MG SL tablet Place 1 tablet (0.4 mg total) under the tongue every 5 (five) minutes as needed for chest pain.   OXYGEN  Inhale 2 L/min into the lungs at bedtime. And as needed for SOB   pantoprazole  (PROTONIX ) 40 MG tablet TAKE 1 TABLET EVERY DAY   Polyethyl Glyc-Propyl Glyc PF (SYSTANE HYDRATION PF) 0.4-0.3 % SOLN Apply 1 drop to eye daily as needed (dry eyes).   rosuvastatin  (CRESTOR ) 40 MG tablet TAKE 1 TABLET EVERY DAY   Vitamin D , Ergocalciferol , 50000 units CAPS Take 1 capsule by mouth once a week.   [DISCONTINUED] fluticasone  (FLONASE ) 50 MCG/ACT nasal spray Place 2 sprays into both nostrils daily as needed for allergies or rhinitis.   No facility-administered medications prior to visit.    Review of Systems  Constitutional: Negative.  Negative for chills, fever and malaise/fatigue.  HENT: Negative.  Negative for congestion and sore throat.   Eyes: Negative.  Negative for blurred vision and pain.  Respiratory: Negative.  Negative for cough and shortness of breath.   Cardiovascular: Negative.  Negative for chest pain, palpitations and leg swelling.  Gastrointestinal: Negative.  Negative for abdominal pain, blood in stool, constipation, diarrhea, heartburn, melena, nausea and vomiting.  Genitourinary: Negative.  Negative for dysuria, flank pain, frequency and urgency.  Musculoskeletal:  Positive for joint pain. Negative for myalgias.  Skin: Negative.   Neurological: Negative.  Negative for dizziness, tingling, sensory change, weakness and headaches.  Endo/Heme/Allergies: Negative.   Psychiatric/Behavioral: Negative.  Negative for depression and suicidal ideas. The patient is not nervous/anxious.        Objective:   BP (!) 118/52   Pulse 65   Ht 5' 11 (1.803 m)   Wt 193 lb (87.5 kg)   SpO2 94%   BMI 26.92 kg/m   Vitals:   07/19/24 1047  BP: (!) 118/52   Pulse: 65  Height: 5' 11 (1.803 m)  Weight: 193 lb (87.5 kg)  SpO2: 94%  BMI (Calculated): 26.93    Physical Exam Vitals and nursing note reviewed.  Constitutional:      Appearance: Normal appearance.  HENT:     Head: Normocephalic and atraumatic.     Nose: Nose normal.     Mouth/Throat:     Mouth: Mucous membranes are moist.     Pharynx: Oropharynx is clear.  Eyes:     Conjunctiva/sclera: Conjunctivae normal.     Pupils: Pupils are equal, round, and reactive to light.  Cardiovascular:     Rate and Rhythm: Normal rate and regular rhythm.     Pulses: Normal pulses.     Heart sounds: Normal heart sounds. No murmur heard. Pulmonary:     Effort: Pulmonary effort is normal.     Breath sounds: Normal breath sounds. No wheezing.  Abdominal:     General: Bowel sounds are normal.     Palpations: Abdomen is soft.     Tenderness: There is no abdominal tenderness. There is no right CVA tenderness or left CVA tenderness.  Musculoskeletal:     Cervical back: Normal range of motion.     Right lower leg: No edema.     Left lower leg: No edema.  Skin:    General: Skin is warm and dry.  Neurological:     General: No focal deficit present.     Mental Status: She is alert and oriented to person, place, and time.  Psychiatric:        Mood and Affect: Mood normal.        Behavior: Behavior normal.      No results found for any visits on 07/19/24.  Recent Results (from the past 2160 hours)  Vitamin B12     Status: None   Collection Time: 05/10/24  8:46 AM  Result Value Ref Range   Vitamin B-12 379 180 - 914 pg/mL    Comment: (NOTE) This assay is not validated for testing neonatal or myeloproliferative syndrome specimens for Vitamin B12 levels. Performed at [facility], [address], [place], [place] [address]   Ferritin     Status: None   Collection Time: 05/10/24  8:47 AM  Result Value Ref Range   Ferritin 17 11 - 307 ng/mL    Comment: Performed at [facility], [address] [place], [place] [address]  Iron and TIBC     Status: None   Collection Time: 05/10/24  8:47 AM  Result Value Ref Range   Iron 89 28 - 170 ug/dL   TIBC 709 749 - 549 ug/dL   Saturation Ratios 31 10.4 - 31.8 %   UIBC 201 ug/dL    Comment: Performed at [facility] [address] [place], [place] [address]  CBC with Differential (Cancer Center Only)     Status: Abnormal   Collection Time: 05/10/24  8:47 AM  Result Value Ref Range   WBC Count 4.8 4.0 - 10.5 K/uL   RBC 3.68 (L)  3.87 - 5.11 MIL/uL   Hemoglobin 10.7 (L) 12.0 - 15.0 g/dL   HCT 66.2 (L) 63.9 - 53.9 %   MCV 91.6 80.0 - 100.0 fL   MCH 29.1 26.0 - 34.0 pg   MCHC 31.8 30.0 - 36.0 g/dL   RDW 86.3 88.4 - 84.4 %   Platelet Count 172 150 - 400 K/uL   nRBC 0.0 0.0 - 0.2 %   Neutrophils Relative % 47 %   Neutro Abs 2.3 1.7 - 7.7 K/uL   Lymphocytes Relative 40 %   Lymphs Abs 1.9 0.7 - 4.0 K/uL   Monocytes Relative 7 %   Monocytes Absolute 0.3 0.1 - 1.0 K/uL   Eosinophils Relative 5 %   Eosinophils Absolute 0.2 0.0 - 0.5 K/uL   Basophils Relative 1 %   Basophils Absolute 0.1 0.0 - 0.1 K/uL   Immature Granulocytes 0 %   Abs Immature Granulocytes 0.01 0.00 - 0.07 K/uL    Comment: Performed at [facility], [address] [place], [place] [address]  CMP (Cancer Center only)     Status: Abnormal   Collection Time: 05/10/24  8:47 AM  Result Value Ref Range   Sodium 141 135 - 145 mmol/L   Potassium 3.2 (L) 3.5 - 5.1 mmol/L   Chloride 109 98 - 111 mmol/L   CO2 26 22 - 32 mmol/L   Glucose, Bld 94 70 - 99 mg/dL    Comment: Glucose reference range applies only to samples taken after fasting for at least 8 hours.   BUN 16 8 - 23 mg/dL   Creatinine 9.19 9.55 - 1.00 mg/dL   Calcium  9.4 8.9 - 10.3 mg/dL   Total Protein 6.1 (L) 6.5 - 8.1 g/dL   Albumin 3.5 3.5 - 5.0 g/dL   AST 19 15 - 41 U/L   ALT 10 0 - 44 U/L   Alkaline Phosphatase 59 38 - 126 U/L   Total Bilirubin 0.6 0.0 - 1.2  mg/dL   GFR, Estimated >39 >39 mL/min    Comment: (NOTE) Calculated using the CKD-EPI Creatinine Equation (2021)    Anion gap 6 5 - 15    Comment: Performed at [facility], [address] [place], [place] [address]  Lipid panel     Status: None   Collection Time: 06/26/24  8:39 AM  Result Value Ref Range   Cholesterol, Total 121 100 - 199 mg/dL   Triglycerides 93 0 - 149 mg/dL   HDL 53 >60 mg/dL   VLDL Cholesterol Cal 18 5 - 40 mg/dL   LDL Chol Calc (NIH) 50 0 - 99 mg/dL   Chol/HDL Ratio 2.3 0.0 - 4.4 ratio    Comment:                                   T. Chol/HDL Ratio                                             Men  Women                               1/2 Avg.Risk  3.4    3.3  Avg.Risk  5.0    4.4                                2X Avg.Risk  9.6    7.1                                3X Avg.Risk 23.4   11.0   CMP14+EGFR     Status: Abnormal   Collection Time: 06/26/24  8:39 AM  Result Value Ref Range   Glucose 88 70 - 99 mg/dL   BUN 22 8 - 27 mg/dL   Creatinine, Ser 9.06 0.57 - 1.00 mg/dL   eGFR 61 >40 fO/fpw/8.26   BUN/Creatinine Ratio 24 12 - 28   Sodium 144 134 - 144 mmol/L   Potassium 4.2 3.5 - 5.2 mmol/L   Chloride 108 (H) 96 - 106 mmol/L   CO2 26 20 - 29 mmol/L   Calcium  9.0 8.7 - 10.3 mg/dL   Total Protein 5.3 (L) 6.0 - 8.5 g/dL   Albumin 3.6 (L) 3.7 - 4.7 g/dL   Globulin, Total 1.7 1.5 - 4.5 g/dL   Bilirubin Total 0.5 0.0 - 1.2 mg/dL   Alkaline Phosphatase 55 48 - 129 IU/L    Comment:               **Please note reference interval change**   AST 15 0 - 40 IU/L   ALT 11 0 - 32 IU/L  TSH     Status: None   Collection Time: 06/26/24  8:39 AM  Result Value Ref Range   TSH 0.762 0.450 - 4.500 uIU/mL  Hemoglobin A1c     Status: None   Collection Time: 06/26/24  8:39 AM  Result Value Ref Range   Hgb A1c MFr Bld 5.4 4.8 - 5.6 %    Comment:          Prediabetes: 5.7 - 6.4          Diabetes: >6.4          Glycemic control  for adults with diabetes: <7.0    Est. average glucose Bld gHb Est-mCnc 108 mg/dL      Assessment & Plan:  Continue Cymbalta  at the current dose.  Monitor blood pressure at home.  X-ray of the right shoulder, if negative will set up physical therapy.  Will share her x-ray results with her orthopedic surgeon in [place]. Problem List Items Addressed This Visit     Allergic rhinitis   Relevant Medications   fluticasone  (FLONASE ) 50 MCG/ACT nasal spray   Hypertension   Depression - Primary   Other Visit Diagnoses       Screening for osteoporosis       Relevant Orders   [facility] DEXA SCAN (Completed)     Acute pain of right shoulder       Relevant Orders   DG Shoulder Right     Needs flu shot       Relevant Orders   Flu Vaccine Trivalent High Dose (Fluad) (Completed)       Return in about 6 weeks (around 08/30/2024).   Total time spent: 30 minutes. This time includes review of previous notes and results and patient face to face interaction during today's visit.    FERNAND FREDY RAMAN, MD  07/19/2024   This document may have been prepared by [facility] Voice Recognition software and  as such may include unintentional dictation errors.

## 2024-07-23 ENCOUNTER — Other Ambulatory Visit: Payer: Self-pay | Admitting: Cardiology

## 2024-07-23 DIAGNOSIS — E790 Hyperuricemia without signs of inflammatory arthritis and tophaceous disease: Secondary | ICD-10-CM

## 2024-07-24 ENCOUNTER — Ambulatory Visit: Payer: Self-pay | Admitting: Internal Medicine

## 2024-07-24 DIAGNOSIS — M79601 Pain in right arm: Secondary | ICD-10-CM

## 2024-07-26 ENCOUNTER — Other Ambulatory Visit: Payer: Self-pay | Admitting: Internal Medicine

## 2024-07-26 DIAGNOSIS — M79601 Pain in right arm: Secondary | ICD-10-CM

## 2024-07-26 DIAGNOSIS — G8929 Other chronic pain: Secondary | ICD-10-CM

## 2024-07-30 DIAGNOSIS — I480 Paroxysmal atrial fibrillation: Secondary | ICD-10-CM | POA: Diagnosis not present

## 2024-07-30 DIAGNOSIS — I2510 Atherosclerotic heart disease of native coronary artery without angina pectoris: Secondary | ICD-10-CM | POA: Diagnosis not present

## 2024-07-31 DIAGNOSIS — M1990 Unspecified osteoarthritis, unspecified site: Secondary | ICD-10-CM | POA: Diagnosis not present

## 2024-07-31 DIAGNOSIS — I130 Hypertensive heart and chronic kidney disease with heart failure and stage 1 through stage 4 chronic kidney disease, or unspecified chronic kidney disease: Secondary | ICD-10-CM | POA: Diagnosis not present

## 2024-07-31 DIAGNOSIS — I4891 Unspecified atrial fibrillation: Secondary | ICD-10-CM | POA: Diagnosis not present

## 2024-07-31 DIAGNOSIS — E785 Hyperlipidemia, unspecified: Secondary | ICD-10-CM | POA: Diagnosis not present

## 2024-07-31 DIAGNOSIS — I509 Heart failure, unspecified: Secondary | ICD-10-CM | POA: Diagnosis not present

## 2024-07-31 DIAGNOSIS — K219 Gastro-esophageal reflux disease without esophagitis: Secondary | ICD-10-CM | POA: Diagnosis not present

## 2024-07-31 DIAGNOSIS — D6869 Other thrombophilia: Secondary | ICD-10-CM | POA: Diagnosis not present

## 2024-07-31 DIAGNOSIS — K5900 Constipation, unspecified: Secondary | ICD-10-CM | POA: Diagnosis not present

## 2024-07-31 DIAGNOSIS — I25119 Atherosclerotic heart disease of native coronary artery with unspecified angina pectoris: Secondary | ICD-10-CM | POA: Diagnosis not present

## 2024-08-03 ENCOUNTER — Encounter: Payer: Self-pay | Admitting: Cardiovascular Disease

## 2024-08-03 ENCOUNTER — Ambulatory Visit: Admitting: Cardiovascular Disease

## 2024-08-03 VITALS — BP 128/70 | HR 59 | Ht 71.0 in | Wt 189.6 lb

## 2024-08-03 DIAGNOSIS — Z131 Encounter for screening for diabetes mellitus: Secondary | ICD-10-CM

## 2024-08-03 DIAGNOSIS — I480 Paroxysmal atrial fibrillation: Secondary | ICD-10-CM

## 2024-08-03 DIAGNOSIS — I25118 Atherosclerotic heart disease of native coronary artery with other forms of angina pectoris: Secondary | ICD-10-CM | POA: Diagnosis not present

## 2024-08-03 DIAGNOSIS — I10 Essential (primary) hypertension: Secondary | ICD-10-CM | POA: Diagnosis not present

## 2024-08-03 DIAGNOSIS — I5033 Acute on chronic diastolic (congestive) heart failure: Secondary | ICD-10-CM

## 2024-08-03 NOTE — Progress Notes (Signed)
 Cardiology Office Note   Date:  08/03/2024   ID:  Legend, Pecore August 11, 1941, [name]  PCP:  Fernand Fredy RAMAN, MD  Cardiologist:  Denyse Fernand, MD      History of Present Illness: Sarah Riddle is a 83 y.o. female who presents for  Chief Complaint  Patient presents with   Follow-up    4 week follow up    Feeling better, as BP improved, although tired.      Past Medical History:  Diagnosis Date   A-fib [facility])    Accelerated hypertension 10/31/2022   Age related osteoporosis    Cancer (HCC)    Melanoma x 2 -- Lt Leg -1976 Rt Arm - 2014   Chronic bilateral low back pain    Chronic kidney disease    Complication of anesthesia    DVT (deep venous thrombosis) (HCC) 1960   right leg  was on BC pills   Dysrhythmia    Family history of breast cancer    Family history of colon cancer    Family history of melanoma    Family history of prostate cancer    Fibrocystic breast disease    GERD (gastroesophageal reflux disease)    Headache    migraine   Heart murmur    not heard now.   History of kidney stones    Hyperlipidemia    Hypertension    IBS (irritable bowel syndrome)    Lumbago    Mitral valve prolapse    Neuropathy    Obstructive sleep apnea    Osteoarthritis of shoulder    PONV (postoperative nausea and vomiting)      Past Surgical History:  Procedure Laterality Date   ABDOMINAL HYSTERECTOMY     BACK SURGERY     BREAST BIOPSY Left 04/20/2023   us  bx/ heart clip/ path pending   BREAST BIOPSY Left 04/20/2023   US  LT BREAST BX W LOC DEV 1ST LESION IMG BX SPEC US  GUIDE 04/20/2023 [facility]   BREAST CYST ASPIRATION Left 2001   Negative   CARDIAC ELECTROPHYSIOLOGY MAPPING AND ABLATION     EYE SURGERY     HUMERUS IM NAIL Right 05/05/2022   Procedure: INTRAMEDULLARY (IM) NAIL HUMERAL;  Surgeon: Kendal Franky SQUIBB, MD;  Location: [facility] OR;  Service: Orthopedics;  Laterality: Right;   HYSTEROTOMY     L4-L5  2020   titaum rebok cage   LEFT  HEART CATH AND CORONARY ANGIOGRAPHY N/A 11/01/2022   Procedure: LEFT HEART CATH AND CORONARY ANGIOGRAPHY and possible PCI and stent;  Surgeon: Fernand Denyse LABOR, MD;  Location: [facility] INVASIVE CV LAB;  Service: Cardiovascular;  Laterality: N/A;   TOTAL HIP ARTHROPLASTY Left      Current Outpatient Medications  Medication Sig Dispense Refill   acetaminophen  (TYLENOL ) 500 MG tablet Take 500 mg by mouth every 6 (six) hours as needed for moderate pain (pain score 4-6) or mild pain (pain score 1-3). Once a day     alendronate  (FOSAMAX ) 70 MG tablet TAKE 1 TABLET WKEELY EVERY THURSDAY. TAKE WITH A FULL GLASS OF WATER ON AN EMPTY STOMACH. 12 tablet 3   allopurinol  (ZYLOPRIM ) 100 MG tablet TAKE 1 TABLET EVERY DAY 90 tablet 3   apixaban  (ELIQUIS ) 5 MG TABS tablet Take 1 tablet (5 mg total) by mouth 2 (two) times daily. 60 tablet 0   azelastine  (ASTELIN ) 0.1 % nasal spray Place 2 sprays into both nostrils at bedtime. Use in each nostril as directed (Patient  taking differently: Place 2 sprays into both nostrils as needed for allergies. Use in each nostril as directed) 30 mL 12   carboxymethylcellulose (REFRESH PLUS) 0.5 % SOLN Place 1 drop into both eyes 3 (three) times daily as needed.     cyanocobalamin  (VITAMIN B12) 1000 MCG tablet Take 1,000 mcg by mouth daily. Monday to Friday     DULoxetine  (CYMBALTA ) 30 MG capsule Take 1 capsule (30 mg total) by mouth daily. 30 capsule 2   EPINEPHrine  0.3 mg/0.3 mL IJ SOAJ injection Inject 0.3 mg into the muscle as needed for anaphylaxis.     fluticasone  (FLONASE ) 50 MCG/ACT nasal spray Place 2 sprays into both nostrils daily as needed for allergies or rhinitis. 3 mL 3   furosemide  (LASIX ) 20 MG tablet Take 20 mg by mouth 3 (three) times a week. Monday, Wednesday, Friday and PRN for weight gain of 1-2 pounds in one day     gabapentin  (NEURONTIN ) 800 MG tablet TAKE 1 TABLET FOUR TIMES DAILY 360 tablet 3   hydrALAZINE  (APRESOLINE ) 50 MG tablet Take 1 tablet (50 mg total)  by mouth 3 (three) times daily. 90 tablet 3   levocetirizine (XYZAL ) 5 MG tablet TAKE 1 TABLET EVERY EVENING 90 tablet 3   losartan  (COZAAR ) 100 MG tablet Take 1 tablet (100 mg total) by mouth daily. 30 tablet 11   meclizine  (ANTIVERT ) 12.5 MG tablet Take 1 tablet (12.5 mg total) by mouth 3 (three) times daily as needed for dizziness. 30 tablet 0   metoprolol  succinate (TOPROL -XL) 25 MG 24 hr tablet TAKE 1 TABLET EVERY DAY 90 tablet 3   nitroGLYCERIN  (NITROSTAT ) 0.4 MG SL tablet Place 1 tablet (0.4 mg total) under the tongue every 5 (five) minutes as needed for chest pain. 25 tablet 0   OXYGEN  Inhale 2 L/min into the lungs at bedtime. And as needed for SOB     pantoprazole  (PROTONIX ) 40 MG tablet TAKE 1 TABLET EVERY DAY 90 tablet 3   rosuvastatin  (CRESTOR ) 40 MG tablet TAKE 1 TABLET EVERY DAY 90 tablet 3   Vitamin D , Ergocalciferol , 50000 units CAPS Take 1 capsule by mouth once a week. 12 capsule 3   Polyethyl Glyc-Propyl Glyc PF (SYSTANE HYDRATION PF) 0.4-0.3 % SOLN Apply 1 drop to eye daily as needed (dry eyes). (Patient not taking: Reported on 08/03/2024)     No current facility-administered medications for this visit.    Allergies:   Iodinated contrast media, Ibuprofen, Sulfa antibiotics, Doxycycline, and Tetracyclines & related    Social History:   reports that she has never smoked. She has never used smokeless tobacco. She reports that she does not drink alcohol  and does not use drugs.   Family History:  family history includes Acute myelogenous leukemia in her sister; Breast cancer in an other family member; Cancer in her sister; Colon cancer (age of onset: 38) in her mother; HIV/AIDS (age of onset: 78) in her brother; Melanoma in her brother, brother, niece, and another family member; Prostate cancer in her brother, brother, and father; Tuberculosis in her paternal aunt.    ROS:     Review of Systems  Constitutional: Negative.   HENT: Negative.    Eyes: Negative.   Respiratory:  Negative.    Gastrointestinal: Negative.   Genitourinary: Negative.   Musculoskeletal: Negative.   Skin: Negative.   Neurological: Negative.   Endo/Heme/Allergies: Negative.   Psychiatric/Behavioral: Negative.    All other systems reviewed and are negative.     All other systems are  reviewed and negative.    PHYSICAL EXAM: VS:  BP 128/70   Pulse (!) 59   Ht 5' 11 (1.803 m)   Wt 189 lb 9.6 oz (86 kg)   SpO2 97%   BMI 26.44 kg/m  , BMI Body mass index is 26.44 kg/m. Last weight:  Wt Readings from Last 3 Encounters:  08/03/24 189 lb 9.6 oz (86 kg)  07/19/24 193 lb (87.5 kg)  07/06/24 195 lb 12.8 oz (88.8 kg)     Physical Exam Constitutional:      Appearance: Normal appearance.  Cardiovascular:     Rate and Rhythm: Normal rate and regular rhythm.     Heart sounds: Normal heart sounds.  Pulmonary:     Effort: Pulmonary effort is normal.     Breath sounds: Normal breath sounds.  Musculoskeletal:     Right lower leg: No edema.     Left lower leg: No edema.  Neurological:     Mental Status: She is alert.       EKG:   Recent Labs: 02/13/2024: B Natriuretic Peptide 730.3 02/15/2024: Magnesium 2.0 05/10/2024: Hemoglobin 10.7; Platelet Count 172 06/26/2024: ALT 11; BUN 22; Creatinine, Ser 0.93; Potassium 4.2; Sodium 144; TSH 0.762    Lipid Panel    Component Value Date/Time   CHOL 121 06/26/2024 0839   TRIG 93 06/26/2024 0839   HDL 53 06/26/2024 0839   CHOLHDL 2.3 06/26/2024 0839   LDLCALC 50 06/26/2024 0839      Other studies Reviewed: Additional studies/ records that were reviewed today include:  Review of the above records demonstrates:       No data to display            ASSESSMENT AND PLAN:    ICD-10-CM   1. Primary hypertension  I10 PCV ECHOCARDIOGRAM COMPLETE   BP improved, no chest , but SOB. Continue current meds.    2. Acute on chronic diastolic CHF (congestive heart failure) (HCC)  I50.33 PCV ECHOCARDIOGRAM COMPLETE    3. Coronary  artery disease involving native coronary artery of native heart with other form of angina pectoris  I25.118 PCV ECHOCARDIOGRAM COMPLETE    4. PAF with history of ablation (paroxysmal atrial fibrillation) (HCC)  I48.0 PCV ECHOCARDIOGRAM COMPLETE       Problem List Items Addressed This Visit       Cardiovascular and Mediastinum   Hypertension - Primary   Relevant Orders   PCV ECHOCARDIOGRAM COMPLETE   PAF with history of ablation (paroxysmal atrial fibrillation) (HCC)   Relevant Orders   PCV ECHOCARDIOGRAM COMPLETE   CAD (coronary artery disease)   Relevant Orders   PCV ECHOCARDIOGRAM COMPLETE   Acute on chronic diastolic CHF (congestive heart failure) (HCC)   Relevant Orders   PCV ECHOCARDIOGRAM COMPLETE       Disposition:   Return in about 4 weeks (around 08/31/2024) for echo and f/u.    Total time spent: 35 minutes  Signed,  Denyse Bathe, MD  08/03/2024 10:35 AM    [facility]

## 2024-08-15 ENCOUNTER — Other Ambulatory Visit: Payer: Self-pay | Admitting: Cardiovascular Disease

## 2024-08-15 DIAGNOSIS — I25112 Atherosclerotic heart disease of native coronary artery with refractory angina pectoris: Secondary | ICD-10-CM

## 2024-08-15 DIAGNOSIS — I10 Essential (primary) hypertension: Secondary | ICD-10-CM

## 2024-08-15 DIAGNOSIS — R0602 Shortness of breath: Secondary | ICD-10-CM

## 2024-08-15 DIAGNOSIS — E782 Mixed hyperlipidemia: Secondary | ICD-10-CM

## 2024-08-27 DIAGNOSIS — D0471 Carcinoma in situ of skin of right lower limb, including hip: Secondary | ICD-10-CM | POA: Diagnosis not present

## 2024-08-29 ENCOUNTER — Ambulatory Visit (INDEPENDENT_AMBULATORY_CARE_PROVIDER_SITE_OTHER)

## 2024-08-29 DIAGNOSIS — I25118 Atherosclerotic heart disease of native coronary artery with other forms of angina pectoris: Secondary | ICD-10-CM

## 2024-08-29 DIAGNOSIS — I361 Nonrheumatic tricuspid (valve) insufficiency: Secondary | ICD-10-CM | POA: Diagnosis not present

## 2024-08-29 DIAGNOSIS — I480 Paroxysmal atrial fibrillation: Secondary | ICD-10-CM

## 2024-08-29 DIAGNOSIS — I340 Nonrheumatic mitral (valve) insufficiency: Secondary | ICD-10-CM

## 2024-08-29 DIAGNOSIS — I10 Essential (primary) hypertension: Secondary | ICD-10-CM

## 2024-08-29 DIAGNOSIS — I5033 Acute on chronic diastolic (congestive) heart failure: Secondary | ICD-10-CM

## 2024-08-30 DIAGNOSIS — I2510 Atherosclerotic heart disease of native coronary artery without angina pectoris: Secondary | ICD-10-CM | POA: Diagnosis not present

## 2024-08-31 ENCOUNTER — Other Ambulatory Visit: Payer: Self-pay | Admitting: Internal Medicine

## 2024-08-31 DIAGNOSIS — F419 Anxiety disorder, unspecified: Secondary | ICD-10-CM

## 2024-08-31 DIAGNOSIS — F321 Major depressive disorder, single episode, moderate: Secondary | ICD-10-CM

## 2024-09-04 ENCOUNTER — Ambulatory Visit: Admitting: Cardiovascular Disease

## 2024-09-04 ENCOUNTER — Emergency Department

## 2024-09-04 ENCOUNTER — Other Ambulatory Visit: Payer: Self-pay

## 2024-09-04 ENCOUNTER — Emergency Department
Admission: EM | Admit: 2024-09-04 | Discharge: 2024-09-04 | Disposition: A | Discharge status: Home / Self Care | Attending: Emergency Medicine | Admitting: Emergency Medicine

## 2024-09-04 DIAGNOSIS — N189 Chronic kidney disease, unspecified: Secondary | ICD-10-CM | POA: Insufficient documentation

## 2024-09-04 DIAGNOSIS — R519 Headache, unspecified: Secondary | ICD-10-CM | POA: Diagnosis present

## 2024-09-04 DIAGNOSIS — I129 Hypertensive chronic kidney disease with stage 1 through stage 4 chronic kidney disease, or unspecified chronic kidney disease: Secondary | ICD-10-CM | POA: Insufficient documentation

## 2024-09-04 DIAGNOSIS — R1110 Vomiting, unspecified: Secondary | ICD-10-CM | POA: Insufficient documentation

## 2024-09-04 DIAGNOSIS — I6523 Occlusion and stenosis of bilateral carotid arteries: Secondary | ICD-10-CM | POA: Diagnosis not present

## 2024-09-04 LAB — CBC WITH DIFFERENTIAL/PLATELET
Abs Immature Granulocytes: 0.01 K/uL (ref 0.00–0.07)
Basophils Absolute: 0 K/uL (ref 0.0–0.1)
Basophils Relative: 0 %
Eosinophils Absolute: 0 K/uL (ref 0.0–0.5)
Eosinophils Relative: 0 %
HCT: 35.1 % — ABNORMAL LOW (ref 36.0–46.0)
Hemoglobin: 11.6 g/dL — ABNORMAL LOW (ref 12.0–15.0)
Immature Granulocytes: 0 %
Lymphocytes Relative: 18 %
Lymphs Abs: 1.3 K/uL (ref 0.7–4.0)
MCH: 29.6 pg (ref 26.0–34.0)
MCHC: 33 g/dL (ref 30.0–36.0)
MCV: 89.5 fL (ref 80.0–100.0)
Monocytes Absolute: 0.2 K/uL (ref 0.1–1.0)
Monocytes Relative: 3 %
Neutro Abs: 5.8 K/uL (ref 1.7–7.7)
Neutrophils Relative %: 79 %
Platelets: 186 K/uL (ref 150–400)
RBC: 3.92 MIL/uL (ref 3.87–5.11)
RDW: 13 % (ref 11.5–15.5)
WBC: 7.4 K/uL (ref 4.0–10.5)
nRBC: 0 % (ref 0.0–0.2)

## 2024-09-04 LAB — COMPREHENSIVE METABOLIC PANEL WITH GFR
ALT: 8 U/L (ref 0–44)
AST: 19 U/L (ref 15–41)
Albumin: 4.2 g/dL (ref 3.5–5.0)
Alkaline Phosphatase: 65 U/L (ref 38–126)
Anion gap: 11 (ref 5–15)
BUN: 15 mg/dL (ref 8–23)
CO2: 28 mmol/L (ref 22–32)
Calcium: 9.7 mg/dL (ref 8.9–10.3)
Chloride: 102 mmol/L (ref 98–111)
Creatinine, Ser: 0.74 mg/dL (ref 0.44–1.00)
GFR, Estimated: >60 mL/min (ref >60)
Glucose, Bld: 152 mg/dL — ABNORMAL HIGH (ref 70–99)
Potassium: 3.4 mmol/L — ABNORMAL LOW (ref 3.5–5.1)
Sodium: 142 mmol/L (ref 135–145)
Total Bilirubin: 0.7 mg/dL (ref 0.0–1.2)
Total Protein: 6.7 g/dL (ref 6.5–8.1)

## 2024-09-04 LAB — RESP PANEL BY RT-PCR (RSV, FLU A&B, COVID)  RVPGX2
Influenza A by PCR: NEGATIVE
Influenza B by PCR: NEGATIVE
Resp Syncytial Virus by PCR: NEGATIVE
SARS Coronavirus 2 by RT PCR: NEGATIVE

## 2024-09-04 MED ORDER — METHYLPREDNISOLONE SODIUM SUCC 40 MG IJ SOLR
40.0000 mg | Freq: Once | INTRAMUSCULAR | Status: AC
  Administered 2024-09-04: 40 mg via INTRAVENOUS
  Filled 2024-09-04: qty 1

## 2024-09-04 MED ORDER — PREDNISONE 20 MG PO TABS
50.0000 mg | ORAL_TABLET | Freq: Four times a day (QID) | ORAL | Status: DC
  Filled 2024-09-04: qty 3

## 2024-09-04 MED ORDER — DIPHENHYDRAMINE HCL 50 MG/ML IJ SOLN
25.0000 mg | Freq: Once | INTRAMUSCULAR | Status: AC
  Administered 2024-09-04: 25 mg via INTRAVENOUS
  Filled 2024-09-04: qty 1

## 2024-09-04 MED ORDER — ACETAMINOPHEN 325 MG PO TABS
650.0000 mg | ORAL_TABLET | Freq: Once | ORAL | Status: AC
  Administered 2024-09-04: 650 mg via ORAL
  Filled 2024-09-04: qty 2

## 2024-09-04 MED ORDER — DIPHENHYDRAMINE HCL 50 MG/ML IJ SOLN
50.0000 mg | Freq: Once | INTRAMUSCULAR | Status: AC
  Administered 2024-09-04: 50 mg via INTRAVENOUS
  Filled 2024-09-04: qty 1

## 2024-09-04 MED ORDER — DIPHENHYDRAMINE HCL 50 MG/ML IJ SOLN
12.5000 mg | Freq: Once | INTRAMUSCULAR | Status: AC
  Administered 2024-09-04: 12.5 mg via INTRAVENOUS
  Filled 2024-09-04: qty 1

## 2024-09-04 MED ORDER — SODIUM CHLORIDE 0.9 % IV BOLUS
500.0000 mL | Freq: Once | INTRAVENOUS | Status: AC
  Administered 2024-09-04: 500 mL via INTRAVENOUS

## 2024-09-04 MED ORDER — TRAMADOL HCL 50 MG PO TABS
50.0000 mg | ORAL_TABLET | Freq: Once | ORAL | Status: AC
  Administered 2024-09-04: 50 mg via ORAL
  Filled 2024-09-04 (×2): qty 1

## 2024-09-04 MED ORDER — DIPHENHYDRAMINE HCL 25 MG PO CAPS: 25.0000 mg | ORAL_CAPSULE | Freq: Once | ORAL | Status: DC

## 2024-09-04 MED ORDER — PROCHLORPERAZINE EDISYLATE 10 MG/2ML IJ SOLN
10.0000 mg | Freq: Once | INTRAMUSCULAR | Status: AC
  Administered 2024-09-04: 10 mg via INTRAVENOUS
  Filled 2024-09-04: qty 2

## 2024-09-04 MED ORDER — IOHEXOL 350 MG/ML SOLN
75.0000 mL | Freq: Once | INTRAVENOUS | Status: AC | PRN
  Administered 2024-09-04: 75 mL via INTRAVENOUS

## 2024-09-04 MED ORDER — METHYLPREDNISOLONE SODIUM SUCC 40 MG IJ SOLR
40.0000 mg | Freq: Once | INTRAMUSCULAR | Status: DC
  Filled 2024-09-04: qty 1

## 2024-09-04 MED ORDER — METOCLOPRAMIDE HCL 5 MG/ML IJ SOLN
10.0000 mg | Freq: Once | INTRAMUSCULAR | Status: AC
  Administered 2024-09-04: 10 mg via INTRAVENOUS
  Filled 2024-09-04: qty 2

## 2024-09-04 MED ORDER — ONDANSETRON 4 MG PO TBDP: 4.0000 mg | ORAL_TABLET | Freq: Three times a day (TID) | ORAL | 0 refills | Status: AC | PRN

## 2024-09-04 NOTE — ED Notes (Signed)
 This EDT assisted this pt to the BR. Pt was ambulatory with minimal assist. Pt is now in bed resting, no other requests at this time, call light within reach.

## 2024-09-04 NOTE — ED Provider Notes (Signed)
 [facility] Provider Note    Event Date/Time   First MD Initiated Contact with Patient 09/04/24 1112     (approximate)   History   Headache   HPI  Sarah Riddle is a 83 y.o. female with PMH of a fib, hypertension, CKD, IBS, migraine presents for evaluation of headache.  Patient states that the headache woke her up out of her sleep this morning and she had 3 episodes of vomiting.  She denies other symptoms including cough, congestion, chest pain, shortness of breath, visual changes.  Patient states that the headache is different from previous migraines and that it has been about 3 or 4 years since she last had a migraine.  Pain is concentrated behind the left temple.      Physical Exam   Triage Vital Signs: ED Triage Vitals  Encounter Vitals Group     BP 09/04/24 1041 (!) 167/89     Girls Systolic BP Percentile --      Girls Diastolic BP Percentile --      Boys Systolic BP Percentile --      Boys Diastolic BP Percentile --      Pulse Rate 09/04/24 1041 82     Resp 09/04/24 1041 15     Temp 09/04/24 1041 98 F (36.7 C)     Temp Source 09/04/24 1041 Oral     SpO2 09/04/24 1041 96 %     Weight 09/04/24 1042 186 lb (84.4 kg)     Height 09/04/24 1042 5' 11 (1.803 m)     Head Circumference --      Peak Flow --      Pain Score 09/04/24 1041 9     Pain Loc --      Pain Education --      Exclude from Growth Chart --     Most recent vital signs: Vitals:   09/04/24 1041 09/04/24 1516  BP: (!) 167/89 (!) 186/92  Pulse: 82 73  Resp: 15 16  Temp: 98 F (36.7 C) 98.6 F (37 C)  SpO2: 96% 98%   General: Awake, no distress.  CV:  Good peripheral perfusion.  RRR. Resp:  Normal effort.  CTAB. Abd:  No distention.  Other:  PERRL, EOM intact, no pronator drift, no ataxia, symmetric facial movements, equal strength in upper and lower extremities, pulses are equal in bilateral upper and lower extremities   ED Results / Procedures / Treatments    Labs (all labs ordered are listed, but only abnormal results are displayed) Labs Reviewed  CBC WITH DIFFERENTIAL/PLATELET - Abnormal; Notable for the following components:      Result Value   Hemoglobin 11.6 (*)    HCT 35.1 (*)    All other components within normal limits  COMPREHENSIVE METABOLIC PANEL WITH GFR - Abnormal; Notable for the following components:   Potassium 3.4 (*)    Glucose, Bld 152 (*)    All other components within normal limits  RESP PANEL BY RT-PCR (RSV, FLU A&B, COVID)  [id number]    RADIOLOGY  CT head obtained, interpreted the images as well as reviewed the radiologist report, which was negative for acute abnormalities. CTA ordered.  PROCEDURES:  Critical Care performed: No  Procedures   MEDICATIONS ORDERED IN ED: Medications  traMADol (ULTRAM) tablet 50 mg (has no administration in time range)  predniSONE  (DELTASONE ) tablet 50 mg (has no administration in time range)  diphenhydrAMINE  (BENADRYL ) capsule 25 mg (has no administration in time range)  sodium chloride  0.9 % bolus 500 mL (0 mLs Intravenous Stopped 09/04/24 1328)  acetaminophen  (TYLENOL ) tablet 650 mg (650 mg Oral Given 09/04/24 1218)  metoCLOPramide  (REGLAN ) injection 10 mg (10 mg Intravenous Given 09/04/24 1220)  diphenhydrAMINE  (BENADRYL ) injection 12.5 mg (12.5 mg Intravenous Given 09/04/24 1219)  prochlorperazine (COMPAZINE) injection 10 mg (10 mg Intravenous Given 09/04/24 1346)  sodium chloride  0.9 % bolus 500 mL (0 mLs Intravenous Stopped 09/04/24 1512)  diphenhydrAMINE  (BENADRYL ) injection 50 mg (50 mg Intravenous Given 09/04/24 1512)     IMPRESSION / MDM / ASSESSMENT AND PLAN / ED COURSE  I reviewed the triage vital signs and the nursing notes.                             83 year old female presents for evaluation of headache. Patient was hypertensive, VSS otherwise. Patient does have a history of hypertension. Patient NAD on exam.   Differential diagnosis includes, but is not limited  to, viral syndrome, migraine, tension headache, intracranial bleed, aneurysm.   Patient's presentation is most consistent with acute complicated illness / injury requiring diagnostic workup.  Physical exam is overall reassuring and that there are no focal neurodeficits identified.  However given it has been several years since patient's last headache and the headache began all of a sudden will obtain CT head to rule out intracranial bleed as a cause.  Will also obtain basic labs as well as a respiratory panel as this may be due to a viral syndrome.  Will treat patient with a migraine cocktail including IV fluids, Reglan , Benadryl , Tylenol .  Patient does take Eliquis  so we will avoid NSAIDs.   Clinical Course as of 09/04/24 1600  Tue Sep 04, 2024  1257 CT Head Wo Contrast Negative. [LD]  1257 CBC with Differential(!) Mild anemia, otherwise unremarkable. [LD]  1257 Comprehensive metabolic panel(!) Unremarkable. [LD]  1258 Resp panel by RT-PCR (RSV, Flu A&B, Covid) Anterior Nasal Swab Negative. [LD]  1511 Patient reassessed and she has had a little bit of improvement since receiving the medications.  Given her age, sudden onset of her headache and no previous headaches, numbness I feel that further workup is needed.  Will obtain CTA.  Patient has a contrast allergy listed in her chart.  Will premedicate with IV Benadryl  and benadryl . Will also give patient a dose of tramadol for her pain. [LD]  1555 Patient was given the benadryl  early, spoke with the radiologist as this should be done an hour prior to her imaging study. I am concerned given patient's age that an additional 50 mg of benadryl  will be too much, so he was ok with 25 mg of benadryl  instead. Attending physician also made aware. [LD]  1556 Care of the patient will be passed off to the oncoming provider pending CTA. Patient updated on plan.  [LD]    Clinical Course User Index [LD] Cleaster Tinnie LABOR, PA-C     FINAL CLINICAL  IMPRESSION(S) / ED DIAGNOSES   Final diagnoses:  Acute nonintractable headache, unspecified headache type     Rx / DC Orders   ED Discharge Orders     None        Note:  This document was prepared using Dragon voice recognition software and may include unintentional dictation errors.   Cleaster Tinnie LABOR, PA-C 09/04/24 1600    Arlander Charleston, MD 09/04/24 [phone]

## 2024-09-04 NOTE — ED Provider Notes (Signed)
 Shared visit   Past medical history significant for atrial fibrillation on Eliquis  who presents to the emergency department with headache.  Headache associated with vomiting.  CT scan of the head was negative and CTA with no signs of aneurysm.  On reevaluation symptoms have improved and she is feeling better.  COVID and influenza testing are negative.  No leukocytosis.  Clinical picture is not consistent with meningitis, no fever, no altered mental status and no meningismus on exam.  Mild hypokalemia at 3.4.  Patient has a follow-up appointment on Thursday which is in 2 days with her primary care physician.  Discussed return to the emergency department if she had any return of symptoms.  Will do prescription for Zofran .  Discussed Tylenol  for headache.   Suzanne Kirsch, MD 09/04/24 2121

## 2024-09-04 NOTE — ED Triage Notes (Signed)
 Pt c/o headache since 3am and vomiting 3x since 6am. Pt took tylenol  without relief. Pt reports the pain is on the left side and runs down her neck. Pt reports she used to have migraines but hasn't had one in years.

## 2024-09-04 NOTE — Discharge Instructions (Addendum)
 You have been diagnosed with acute non-intractable headache.  CT scan of the head ruled out intracranial hemorrhage, mass, fractures.  CT angio head neck with and without contrast read out large vessel occlusion.  Please take acetaminophen  650 mg every 8 hours as needed for pain.  You can take Zofran  20 minutes before main meals.  Please drink plenty of fluids.  Please go to your appointment with your PCP on Thursday.  Please come back to ED or go to your PCP if you have new symptoms symptoms worsen.  It was a pleasure to help you today.  Delva Derden, PA-C.

## 2024-09-04 NOTE — ED Provider Notes (Signed)
 ----------------------------------------- 3:16 PM on 09/04/2024 -----------------------------------------  Blood pressure (!) 167/89, pulse 82, temperature 98 F (36.7 C), temperature source Oral, resp. rate 15, height 5' 11 (1.803 m), weight 84.4 kg, SpO2 96%.  Assuming care from [name], PA-C/NP-C.  In short, Sarah Riddle is a 83 y.o. female with a chief complaint of Headache .  Refer to the original H&P for additional details.  The current plan of care is to wait for results of CTA to determine disposition  ____________________________________________    ED Results / Procedures / Treatments   Labs (all labs ordered are listed, but only abnormal results are displayed) Labs Reviewed  CBC WITH DIFFERENTIAL/PLATELET - Abnormal; Notable for the following components:      Result Value   Hemoglobin 11.6 (*)    HCT 35.1 (*)    All other components within normal limits  COMPREHENSIVE METABOLIC PANEL WITH GFR - Abnormal; Notable for the following components:   Potassium 3.4 (*)    Glucose, Bld 152 (*)    All other components within normal limits  RESP PANEL BY RT-PCR (RSV, FLU A&B, COVID)  [id number]       RADIOLOGY  I personally viewed and evaluated these images as part of my medical decision making, as well as reviewing the written report by the radiologist.  ED Provider Interpretation:   CT Angio Head Neck W WO CM Result Date: 09/04/2024 EXAM: CTA HEAD AND NECK WITHOUT AND WITH 09/04/2024 08:13:58 PM TECHNIQUE: CTA of the head and neck was performed without and with the administration of 75 mL of iohexol  (OMNIPAQUE ) 350 MG/ML injection. Multiplanar 2D and/or 3D reformatted images are provided for review. Automated exposure control, iterative reconstruction, and/or weight based adjustment of the mA/kV was utilized to reduce the radiation dose to as low as reasonably achievable. Stenosis of the internal carotid arteries measured using NASCET criteria. COMPARISON: None  available CLINICAL HISTORY: new worsening headache FINDINGS: CTA NECK: AORTIC ARCH AND ARCH VESSELS: No dissection or arterial injury. No significant stenosis of the brachiocephalic or subclavian arteries. CERVICAL CAROTID ARTERIES: Atherosclerotic calcification at both carotid bifurcations without hemodynamically significant stenosis. No dissection or arterial injury. CERVICAL VERTEBRAL ARTERIES: No dissection, arterial injury, or significant stenosis. LUNGS AND MEDIASTINUM: Unremarkable. SOFT TISSUES: No acute abnormality. BONES: No acute abnormality. CTA HEAD: ANTERIOR CIRCULATION: Mild bilateral internal carotid artery atherosclerotic calcification including atherosclerosis of the carotid siphons. No hemodynamically significant stenosis. No emergent large vessel occlusion. No significant stenosis of the anterior cerebral arteries. No significant stenosis of the middle cerebral arteries. No aneurysm. POSTERIOR CIRCULATION: Fetal origin of the left PCA. No significant stenosis of the posterior cerebral arteries. No significant stenosis of the basilar artery. No significant stenosis of the vertebral arteries. No aneurysm. OTHER: No dural venous sinus thrombosis on this non-dedicated study. IMPRESSION: 1. No emergent large vessel occlusion. 2. No hemodynamically significant stenosis involving the carotid bifurcations or intracranial carotid arteries despite atherosclerotic calcification. Electronically signed by: Franky Stanford MD 09/04/2024 08:44 PM EST RP Workstation: [id number]   CT Head Wo Contrast Result Date: 09/04/2024 EXAM: CT HEAD WITHOUT CONTRAST 09/04/2024 12:42:15 PM TECHNIQUE: CT of the head was performed without the administration of intravenous contrast. Automated exposure control, iterative reconstruction, and/or weight based adjustment of the mA/kV was utilized to reduce the radiation dose to as low as reasonably achievable. COMPARISON: None available. CLINICAL HISTORY: Headache, sudden, severe  FINDINGS: BRAIN AND VENTRICLES: No acute hemorrhage. No evidence of acute infarct. No hydrocephalus. No extra-axial collection. No mass effect or  midline shift. ORBITS: No acute abnormality. SINUSES: No acute abnormality. SOFT TISSUES AND SKULL: No acute soft tissue abnormality. No skull fracture. IMPRESSION: 1. No acute intracranial abnormality. Electronically signed by: Gilmore Molt MD 09/04/2024 12:54 PM EST RP Workstation: [id number]     PROCEDURES:  Critical Care performed: No  Procedures   MEDICATIONS ORDERED IN ED: Medications  sodium chloride  0.9 % bolus 500 mL (0 mLs Intravenous Stopped 09/04/24 1328)  acetaminophen  (TYLENOL ) tablet 650 mg (650 mg Oral Given 09/04/24 1218)  metoCLOPramide  (REGLAN ) injection 10 mg (10 mg Intravenous Given 09/04/24 1220)  diphenhydrAMINE  (BENADRYL ) injection 12.5 mg (12.5 mg Intravenous Given 09/04/24 1219)  prochlorperazine (COMPAZINE) injection 10 mg (10 mg Intravenous Given 09/04/24 1346)  sodium chloride  0.9 % bolus 500 mL (0 mLs Intravenous Stopped 09/04/24 1512)  diphenhydrAMINE  (BENADRYL ) injection 50 mg (50 mg Intravenous Given 09/04/24 1512)  traMADol (ULTRAM) tablet 50 mg (50 mg Oral Given 09/04/24 1559)  methylPREDNISolone  sodium succinate (SOLU-MEDROL ) 40 mg/mL injection 40 mg (40 mg Intravenous Given 09/04/24 1609)  diphenhydrAMINE  (BENADRYL ) injection 25 mg (25 mg Intravenous Given 09/04/24 1905)  iohexol  (OMNIPAQUE ) 350 MG/ML injection 75 mL (75 mLs Intravenous Contrast Given 09/04/24 2007)     IMPRESSION / MDM / ASSESSMENT AND PLAN / ED COURSE  I reviewed the triage vital signs and the nursing notes.                              Differential diagnosis includes, but is not limited to, carotid aneurysm, migraine, intracranial hemorrhage, viral infection.  Patient's presentation is most consistent with acute complicated illness / injury requiring diagnostic workup. Did interview the patient who refers feeling dizziness when she is  getting up from bed.  That can be attributed to the anemia, hemoglobin 11.6.  Patient's diagnosis is consistent with migraine. Patient will be discharged home with prescriptions for Toy Cox if. Patient is to follow up with PCP on Thursday as needed or otherwise directed. Patient is given ED precautions to return to the ED for any worsening or new symptoms.  Clinical Course as of 09/04/24 2120  Tue Sep 04, 2024  1257 CT Head Wo Contrast Negative. [LD]  1257 CBC with Differential(!) Mild anemia, otherwise unremarkable. [LD]  1257 Comprehensive metabolic panel(!) Unremarkable. [LD]  1258 Resp panel by RT-PCR (RSV, Flu A&B, Covid) Anterior Nasal Swab Negative. [LD]  1511 Patient reassessed and she has had a little bit of improvement since receiving the medications.  Given her age, sudden onset of her headache and no previous headaches, numbness I feel that further workup is needed.  Will obtain CTA.  Patient has a contrast allergy listed in her chart.  Will premedicate with IV Benadryl  and benadryl . Will also give patient a dose of tramadol for her pain. [LD]  1555 Patient was given the benadryl  early, spoke with the radiologist as this should be done an hour prior to her imaging study. I am concerned given patient's age that an additional 50 mg of benadryl  will be too much, so he was ok with 25 mg of benadryl  instead. Attending physician also made aware. [LD]  1556 Care of the patient will be passed off to the oncoming provider pending CTA. Patient updated on plan.  [LD]  1637 I did reinterview and reassessed the patient.  Patient endorses improvement of the headache.  Patient receive Solu-Medrol  doses at 4 PM, she needs to receive Benadryl  doses at 7 PM in order to  have CTA at 8 PM.  Protocol due to allergy to contrast. [AE]  2105 CT Angio Head Neck W WO CM No emergent large vessel occlusion. 2. No hemodynamically significant stenosis involving the carotid bifurcations   [AE]  2105 Updated  patient with results of the CTA.  Patient endorses feeling better.  Patient is here with her daughter.  Patient is going to be discharged with celecoxib, acetaminophen .  Patient has a follow-up appointment with her PCP on Thursday.  Patient is agreeable with the plan. [AE]    Clinical Course User Index [AE] Janit Kast, PA-C [LD] Cleaster Tinnie LABOR, PA-C    FINAL CLINICAL IMPRESSION(S) / ED DIAGNOSES   Final diagnoses:  Acute nonintractable headache, unspecified headache type     Rx / DC Orders   ED Discharge Orders          Ordered    ondansetron  (ZOFRAN -ODT) 4 MG disintegrating tablet  Every 8 hours PRN        09/04/24 2119             Note:  This document was prepared using Dragon voice recognition software and may include unintentional dictation errors.    Janit Kast, PA-C 09/04/24 2120    Suzanne Kirsch, MD 09/04/24 2153

## 2024-09-06 ENCOUNTER — Encounter: Payer: Self-pay | Admitting: Internal Medicine

## 2024-09-06 ENCOUNTER — Ambulatory Visit (INDEPENDENT_AMBULATORY_CARE_PROVIDER_SITE_OTHER): Admitting: Internal Medicine

## 2024-09-06 VITALS — BP 104/60 | HR 67 | Ht 71.0 in | Wt 187.0 lb

## 2024-09-06 DIAGNOSIS — G43119 Migraine with aura, intractable, without status migrainosus: Secondary | ICD-10-CM

## 2024-09-06 DIAGNOSIS — I10 Essential (primary) hypertension: Secondary | ICD-10-CM | POA: Diagnosis not present

## 2024-09-06 DIAGNOSIS — G4733 Obstructive sleep apnea (adult) (pediatric): Secondary | ICD-10-CM | POA: Diagnosis not present

## 2024-09-06 DIAGNOSIS — E782 Mixed hyperlipidemia: Secondary | ICD-10-CM

## 2024-09-06 DIAGNOSIS — I25118 Atherosclerotic heart disease of native coronary artery with other forms of angina pectoris: Secondary | ICD-10-CM

## 2024-09-06 DIAGNOSIS — Z131 Encounter for screening for diabetes mellitus: Secondary | ICD-10-CM

## 2024-09-06 NOTE — Progress Notes (Signed)
 Established Patient Office Visit  Subjective:  Patient ID: Sarah Riddle, female    DOB: [birth year]  Age: 83 y.o. MRN: [id number]  Chief Complaint  Patient presents with   Follow-up    6 week follow up    Patient went to the emergency department 09/04/24 for migraine with multiple episodes of vomiting. Her head CT and CTA was unremarkable. Her symptoms improved after migraine cocktail. She reports feeling tired, dizzy yesterday but has been improving. She has hx of migraines but reports this migraine was different than her previous migraines. Her BP is on the low side today and patient reports she took her medication this morning. Recommend patient go home and rest. Recommend patient check BP prior to taking BP medication in the morning to reduce risk of hypotension.  Patient states she missed her Cardiologist appointment and PT appointment due to her migraine. Patient will get those rescheduled.  She recently had basic labs in the emergency room. Will check routine blood work at her next appointment.    No other concerns at this time.   Past Medical History:  Diagnosis Date   A-fib [facility])    Accelerated hypertension 10/31/2022   Age related osteoporosis    Cancer (HCC)    Melanoma x 2 -- Lt Leg -1976 Rt Arm - 2014   Chronic bilateral low back pain    Chronic kidney disease    Complication of anesthesia    DVT (deep venous thrombosis) (HCC) 1960   right leg  was on BC pills   Dysrhythmia    Family history of breast cancer    Family history of colon cancer    Family history of melanoma    Family history of prostate cancer    Fibrocystic breast disease    GERD (gastroesophageal reflux disease)    Headache    migraine   Heart murmur    not heard now.   History of kidney stones    Hyperlipidemia    Hypertension    IBS (irritable bowel syndrome)    Lumbago    Mitral valve prolapse    Neuropathy    Obstructive sleep apnea    Osteoarthritis of shoulder    PONV  (postoperative nausea and vomiting)     Past Surgical History:  Procedure Laterality Date   ABDOMINAL HYSTERECTOMY     BACK SURGERY     BREAST BIOPSY Left 04/20/2023   us  bx/ heart clip/ path pending   BREAST BIOPSY Left 04/20/2023   US  LT BREAST BX W LOC DEV 1ST LESION IMG BX SPEC US  GUIDE 04/20/2023 [facility]   BREAST CYST ASPIRATION Left 2001   Negative   CARDIAC ELECTROPHYSIOLOGY MAPPING AND ABLATION     EYE SURGERY     HUMERUS IM NAIL Right 05/05/2022   Procedure: INTRAMEDULLARY (IM) NAIL HUMERAL;  Surgeon: Kendal Franky SQUIBB, MD;  Location: [facility] OR;  Service: Orthopedics;  Laterality: Right;   HYSTEROTOMY     L4-L5  2020   titaum rebok cage   LEFT HEART CATH AND CORONARY ANGIOGRAPHY N/A 11/01/2022   Procedure: LEFT HEART CATH AND CORONARY ANGIOGRAPHY and possible PCI and stent;  Surgeon: Fernand Denyse LABOR, MD;  Location: [facility] INVASIVE CV LAB;  Service: Cardiovascular;  Laterality: N/A;   TOTAL HIP ARTHROPLASTY Left     Social History   Socioeconomic History   Marital status: Married    Spouse name: Not on file   Number of children: Not on file   Years of education:  Not on file   Highest education level: Not on file  Occupational History   Not on file  Tobacco Use   Smoking status: Never   Smokeless tobacco: Never  Vaping Use   Vaping status: Never Used  Substance and Sexual Activity   Alcohol  use: No   Drug use: No   Sexual activity: Not Currently  Other Topics Concern   Not on file  Social History Narrative   Not on file   Social Drivers of Health   Financial Resource Strain: Low Risk  (09/23/2023)   Overall Financial Resource Strain (CARDIA)    Difficulty of Paying Living Expenses: Not hard at all  Food Insecurity: No Food Insecurity (02/16/2024)   Hunger Vital Sign    Worried About Running Out of Food in the Last Year: Never true    Ran Out of Food in the Last Year: Never true  Transportation Needs: No Transportation Needs (02/16/2024)   PRAPARE -  Administrator, Civil Service (Medical): No    Lack of Transportation (Non-Medical): No  Physical Activity: Inactive (09/23/2023)   Exercise Vital Sign    Days of Exercise per Week: 0 days    Minutes of Exercise per Session: 0 min  Stress: No Stress Concern Present (09/23/2023)   [employer] of Occupational Health - Occupational Stress Questionnaire    Feeling of Stress : Only a little  Social Connections: Moderately Isolated (02/13/2024)   Social Connection and Isolation Panel    Frequency of Communication with Friends and Family: More than three times a week    Frequency of Social Gatherings with Friends and Family: Twice a week    Attends Religious Services: Never    Database Administrator or Organizations: No    Attends Banker Meetings: Never    Marital Status: Married  Catering Manager Violence: Not At Risk (02/16/2024)   Humiliation, Afraid, Rape, and Kick questionnaire    Fear of Current or Ex-Partner: No    Emotionally Abused: No    Physically Abused: No    Sexually Abused: No    Family History  Problem Relation Age of Onset   Colon cancer Mother 22   Prostate cancer Father        dx 14s   Cancer Sister    Acute myelogenous leukemia Sister        dx 45s   Prostate cancer Brother    Melanoma Brother    Prostate cancer Brother    Melanoma Brother    HIV/AIDS Brother 18   Tuberculosis Paternal Aunt    Melanoma Other    Breast cancer Other    Melanoma Niece     Allergies  Allergen Reactions   Iodinated Contrast Media Shortness Of Breath and Other (See Comments)    Can be premedicated  Have been able to tolerate if given premeds (benadryl , prednisone )   Ibuprofen Other (See Comments)    stomach problems    Sulfa Antibiotics Other (See Comments)    Stomach problems  Stomach problems  Stomach problems  Stomach problems   Doxycycline Diarrhea and Nausea And Vomiting   Tetracyclines & Related Nausea Only and Nausea And  Vomiting    Outpatient Medications Prior to Visit  Medication Sig   acetaminophen  (TYLENOL ) 500 MG tablet Take 500 mg by mouth every 6 (six) hours as needed for moderate pain (pain score 4-6) or mild pain (pain score 1-3). Once a day   alendronate  (FOSAMAX ) 70 MG tablet TAKE  1 TABLET WKEELY EVERY THURSDAY. TAKE WITH A FULL GLASS OF WATER ON AN EMPTY STOMACH.   allopurinol  (ZYLOPRIM ) 100 MG tablet TAKE 1 TABLET EVERY DAY   apixaban  (ELIQUIS ) 5 MG TABS tablet Take 1 tablet (5 mg total) by mouth 2 (two) times daily.   azelastine  (ASTELIN ) 0.1 % nasal spray Place 2 sprays into both nostrils at bedtime. Use in each nostril as directed (Patient taking differently: Place 2 sprays into both nostrils as needed for allergies. Use in each nostril as directed)   carboxymethylcellulose (REFRESH PLUS) 0.5 % SOLN Place 1 drop into both eyes 3 (three) times daily as needed.   cyanocobalamin  (VITAMIN B12) 1000 MCG tablet Take 1,000 mcg by mouth daily. Monday to Friday   DULoxetine  (CYMBALTA ) 30 MG capsule TAKE 1 CAPSULE BY MOUTH ONCE DAILY   EPINEPHrine  0.3 mg/0.3 mL IJ SOAJ injection Inject 0.3 mg into the muscle as needed for anaphylaxis.   fluticasone  (FLONASE ) 50 MCG/ACT nasal spray Place 2 sprays into both nostrils daily as needed for allergies or rhinitis.   furosemide  (LASIX ) 20 MG tablet Take 20 mg by mouth 3 (three) times a week. Monday, Wednesday, Friday and PRN for weight gain of 1-2 pounds in one day   gabapentin  (NEURONTIN ) 800 MG tablet TAKE 1 TABLET FOUR TIMES DAILY   hydrALAZINE  (APRESOLINE ) 50 MG tablet TAKE 1 TABLET THREE TIMES DAILY   levocetirizine (XYZAL ) 5 MG tablet TAKE 1 TABLET EVERY EVENING   losartan  (COZAAR ) 100 MG tablet Take 1 tablet (100 mg total) by mouth daily.   meclizine  (ANTIVERT ) 12.5 MG tablet Take 1 tablet (12.5 mg total) by mouth 3 (three) times daily as needed for dizziness.   metoprolol  succinate (TOPROL -XL) 25 MG 24 hr tablet TAKE 1 TABLET EVERY DAY   nitroGLYCERIN   (NITROSTAT ) 0.4 MG SL tablet Place 1 tablet (0.4 mg total) under the tongue every 5 (five) minutes as needed for chest pain.   ondansetron  (ZOFRAN -ODT) 4 MG disintegrating tablet Take 1 tablet (4 mg total) by mouth every 8 (eight) hours as needed for nausea or vomiting.   OXYGEN  Inhale 2 L/min into the lungs at bedtime. And as needed for SOB   pantoprazole  (PROTONIX ) 40 MG tablet TAKE 1 TABLET EVERY DAY   rosuvastatin  (CRESTOR ) 40 MG tablet TAKE 1 TABLET EVERY DAY   Vitamin D , Ergocalciferol , 50000 units CAPS Take 1 capsule by mouth once a week.   Polyethyl Glyc-Propyl Glyc PF (SYSTANE HYDRATION PF) 0.4-0.3 % SOLN Apply 1 drop to eye daily as needed (dry eyes). (Patient not taking: Reported on 09/06/2024)   No facility-administered medications prior to visit.    Review of Systems  Constitutional:  Positive for malaise/fatigue. Negative for chills and fever.  HENT: Negative.  Negative for congestion and sore throat.   Eyes: Negative.  Negative for blurred vision and pain.  Respiratory: Negative.  Negative for cough and shortness of breath.   Cardiovascular: Negative.  Negative for chest pain, palpitations and leg swelling.  Gastrointestinal: Negative.  Negative for abdominal pain, blood in stool, constipation, diarrhea, heartburn, melena, nausea and vomiting.  Genitourinary: Negative.  Negative for dysuria, flank pain, frequency and urgency.  Musculoskeletal: Negative.  Negative for joint pain and myalgias.  Skin: Negative.   Neurological:  Positive for dizziness and headaches. Negative for tingling, sensory change and weakness.  Endo/Heme/Allergies: Negative.   Psychiatric/Behavioral: Negative.  Negative for depression and suicidal ideas. The patient is not nervous/anxious.        Objective:   BP 104/60  Pulse 67   Ht 5' 11 (1.803 m)   Wt 187 lb (84.8 kg)   SpO2 98%   BMI 26.08 kg/m   Vitals:   09/06/24 0949  BP: 104/60  Pulse: 67  Height: 5' 11 (1.803 m)  Weight: 187 lb  (84.8 kg)  SpO2: 98%  BMI (Calculated): 26.09    Physical Exam Vitals and nursing note reviewed.  Constitutional:      Appearance: Normal appearance.  HENT:     Head: Normocephalic and atraumatic.     Nose: Nose normal.     Mouth/Throat:     Mouth: Mucous membranes are moist.     Pharynx: Oropharynx is clear.  Eyes:     Conjunctiva/sclera: Conjunctivae normal.     Pupils: Pupils are equal, round, and reactive to light.  Cardiovascular:     Rate and Rhythm: Normal rate and regular rhythm.     Pulses: Normal pulses.     Heart sounds: Normal heart sounds. No murmur heard. Pulmonary:     Effort: Pulmonary effort is normal.     Breath sounds: Normal breath sounds. No wheezing.  Abdominal:     General: Bowel sounds are normal.     Palpations: Abdomen is soft.     Tenderness: There is no abdominal tenderness. There is no right CVA tenderness or left CVA tenderness.  Musculoskeletal:        General: Normal range of motion.     Cervical back: Normal range of motion.     Right lower leg: No edema.     Left lower leg: No edema.  Skin:    General: Skin is warm and dry.  Neurological:     General: No focal deficit present.     Mental Status: She is alert and oriented to person, place, and time.  Psychiatric:        Mood and Affect: Mood normal.        Behavior: Behavior normal.      No results found for any visits on 09/06/24.  Recent Results (from the past 2160 hours)  Lipid panel     Status: None   Collection Time: 06/26/24  8:39 AM  Result Value Ref Range   Cholesterol, Total 121 100 - 199 mg/dL   Triglycerides 93 0 - 149 mg/dL   HDL 53 >60 mg/dL   VLDL Cholesterol Cal 18 5 - 40 mg/dL   LDL Chol Calc (NIH) 50 0 - 99 mg/dL   Chol/HDL Ratio 2.3 0.0 - 4.4 ratio    Comment:                                   T. Chol/HDL Ratio                                             Men  Women                               1/2 Avg.Risk  3.4    3.3                                    Avg.Risk  5.0    4.4  2X Avg.Risk  9.6    7.1                                3X Avg.Risk 23.4   11.0   CMP14+EGFR     Status: Abnormal   Collection Time: 06/26/24  8:39 AM  Result Value Ref Range   Glucose 88 70 - 99 mg/dL   BUN 22 8 - 27 mg/dL   Creatinine, Ser 9.06 0.57 - 1.00 mg/dL   eGFR 61 >40 fO/fpw/8.26   BUN/Creatinine Ratio 24 12 - 28   Sodium 144 134 - 144 mmol/L   Potassium 4.2 3.5 - 5.2 mmol/L   Chloride 108 (H) 96 - 106 mmol/L   CO2 26 20 - 29 mmol/L   Calcium  9.0 8.7 - 10.3 mg/dL   Total Protein 5.3 (L) 6.0 - 8.5 g/dL   Albumin 3.6 (L) 3.7 - 4.7 g/dL   Globulin, Total 1.7 1.5 - 4.5 g/dL   Bilirubin Total 0.5 0.0 - 1.2 mg/dL   Alkaline Phosphatase 55 48 - 129 IU/L    Comment:               **Please note reference interval change**   AST 15 0 - 40 IU/L   ALT 11 0 - 32 IU/L  TSH     Status: None   Collection Time: 06/26/24  8:39 AM  Result Value Ref Range   TSH 0.762 0.450 - 4.500 uIU/mL  Hemoglobin A1c     Status: None   Collection Time: 06/26/24  8:39 AM  Result Value Ref Range   Hgb A1c MFr Bld 5.4 4.8 - 5.6 %    Comment:          Prediabetes: 5.7 - 6.4          Diabetes: >6.4          Glycemic control for adults with diabetes: <7.0    Est. average glucose Bld gHb Est-mCnc 108 mg/dL  CBC with Differential     Status: Abnormal   Collection Time: 09/04/24 10:43 AM  Result Value Ref Range   WBC 7.4 4.0 - 10.5 K/uL   RBC 3.92 3.87 - 5.11 MIL/uL   Hemoglobin 11.6 (L) 12.0 - 15.0 g/dL   HCT 64.8 (L) 63.9 - 53.9 %   MCV 89.5 80.0 - 100.0 fL   MCH 29.6 26.0 - 34.0 pg   MCHC 33.0 30.0 - 36.0 g/dL   RDW 86.9 88.4 - 84.4 %   Platelets 186 150 - 400 K/uL   nRBC 0.0 0.0 - 0.2 %   Neutrophils Relative % 79 %   Neutro Abs 5.8 1.7 - 7.7 K/uL   Lymphocytes Relative 18 %   Lymphs Abs 1.3 0.7 - 4.0 K/uL   Monocytes Relative 3 %   Monocytes Absolute 0.2 0.1 - 1.0 K/uL   Eosinophils Relative 0 %   Eosinophils Absolute 0.0 0.0 - 0.5 K/uL    Basophils Relative 0 %   Basophils Absolute 0.0 0.0 - 0.1 K/uL   Immature Granulocytes 0 %   Abs Immature Granulocytes 0.01 0.00 - 0.07 K/uL    Comment: Performed at [facility], [address] [place], [place] [address]  Comprehensive metabolic panel     Status: Abnormal   Collection Time: 09/04/24 10:43 AM  Result Value Ref Range   Sodium 142 135 - 145 mmol/L   Potassium 3.4 (L) 3.5 - 5.1  mmol/L   Chloride 102 98 - 111 mmol/L   CO2 28 22 - 32 mmol/L   Glucose, Bld 152 (H) 70 - 99 mg/dL    Comment: Glucose reference range applies only to samples taken after fasting for at least 8 hours.   BUN 15 8 - 23 mg/dL   Creatinine, Ser 9.25 0.44 - 1.00 mg/dL   Calcium  9.7 8.9 - 10.3 mg/dL   Total Protein 6.7 6.5 - 8.1 g/dL   Albumin 4.2 3.5 - 5.0 g/dL   AST 19 15 - 41 U/L   ALT 8 0 - 44 U/L   Alkaline Phosphatase 65 38 - 126 U/L   Total Bilirubin 0.7 0.0 - 1.2 mg/dL   GFR, Estimated >39 >39 mL/min    Comment: (NOTE) Calculated using the CKD-EPI Creatinine Equation (2021)    Anion gap 11 5 - 15    Comment: Performed at [facility], [address], [place], [place] [address]  Resp panel by RT-PCR (RSV, Flu A&B, Covid) Anterior Nasal Swab     Status: None   Collection Time: 09/04/24 11:24 AM   Specimen: Anterior Nasal Swab  Result Value Ref Range   SARS Coronavirus 2 by RT PCR NEGATIVE NEGATIVE    Comment: (NOTE) SARS-CoV-2 target nucleic acids are NOT DETECTED.  The SARS-CoV-2 RNA is generally detectable in upper respiratory specimens during the acute phase of infection. The lowest concentration of SARS-CoV-2 viral copies this assay can detect is 138 copies/mL. A negative result does not preclude SARS-Cov-2 infection and should not be used as the sole basis for treatment or other patient management decisions. A negative result may occur with  improper specimen collection/handling, submission of specimen other than nasopharyngeal swab, presence of viral  mutation(s) within the areas targeted by this assay, and inadequate number of viral copies(<138 copies/mL). A negative result must be combined with clinical observations, patient history, and epidemiological information. The expected result is Negative.  Fact Sheet for Patients:  bloggercourse.com  Fact Sheet for Healthcare Providers:  seriousbroker.it  This test is no t yet approved or cleared by the [place]  FDA and  has been authorized for detection and/or diagnosis of SARS-CoV-2 by FDA under an Emergency Use Authorization (EUA). This EUA will remain  in effect (meaning this test can be used) for the duration of the COVID-19 declaration under Section 564(b)(1) of the Act, 21 U.S.C.section 360bbb-3(b)(1), unless the authorization is terminated  or revoked sooner.       Influenza A by PCR NEGATIVE NEGATIVE   Influenza B by PCR NEGATIVE NEGATIVE    Comment: (NOTE) The Xpert Xpress SARS-CoV-2/FLU/RSV plus assay is intended as an aid in the diagnosis of influenza from Nasopharyngeal swab specimens and should not be used as a sole basis for treatment. Nasal washings and aspirates are unacceptable for Xpert Xpress SARS-CoV-2/FLU/RSV testing.  Fact Sheet for Patients: bloggercourse.com  Fact Sheet for Healthcare Providers: seriousbroker.it  This test is not yet approved or cleared by the [place]  FDA and has been authorized for detection and/or diagnosis of SARS-CoV-2 by FDA under an Emergency Use Authorization (EUA). This EUA will remain in effect (meaning this test can be used) for the duration of the COVID-19 declaration under Section 564(b)(1) of the Act, 21 U.S.C. section 360bbb-3(b)(1), unless the authorization is terminated or revoked.     Resp Syncytial Virus by PCR NEGATIVE NEGATIVE    Comment: (NOTE) Fact Sheet for  Patients: bloggercourse.com  Fact Sheet for Healthcare Providers: seriousbroker.it  This  test is not yet approved or cleared by the [place]  FDA and has been authorized for detection and/or diagnosis of SARS-CoV-2 by FDA under an Emergency Use Authorization (EUA). This EUA will remain in effect (meaning this test can be used) for the duration of the COVID-19 declaration under Section 564(b)(1) of the Act, 21 U.S.C. section 360bbb-3(b)(1), unless the authorization is terminated or revoked.  Performed at [facility], [address], [place], [place] [address]       Assessment & Plan:  Continue medications as prescribed. Check BP prior to taking BP medication to prevent hypotension. Reinforced healthy diet and drinking plenty of water each day. Recommend rest and stress reduction techniques. Problem List Items Addressed This Visit     Hypertension - Primary   Migraine headache   OSA on CPAP   HLD (hyperlipidemia)   CAD (coronary artery disease)    Return in about 2 months (around 11/07/2024) for AWV.   Total time spent: 25 minutes. This time includes review of previous notes and results and patient face to face interaction during today's visit.    FERNAND FREDY RAMAN, MD  09/06/2024   This document may have been prepared by [facility] Voice Recognition software and as such may include unintentional dictation errors.

## 2024-09-07 ENCOUNTER — Ambulatory Visit: Admitting: Cardiovascular Disease

## 2024-09-07 ENCOUNTER — Encounter: Payer: Self-pay | Admitting: Cardiovascular Disease

## 2024-09-07 VITALS — BP 128/60 | HR 69 | Ht 71.0 in | Wt 186.8 lb

## 2024-09-07 DIAGNOSIS — Z131 Encounter for screening for diabetes mellitus: Secondary | ICD-10-CM

## 2024-09-07 DIAGNOSIS — I25118 Atherosclerotic heart disease of native coronary artery with other forms of angina pectoris: Secondary | ICD-10-CM | POA: Diagnosis not present

## 2024-09-07 DIAGNOSIS — I10 Essential (primary) hypertension: Secondary | ICD-10-CM | POA: Diagnosis not present

## 2024-09-07 DIAGNOSIS — I5033 Acute on chronic diastolic (congestive) heart failure: Secondary | ICD-10-CM | POA: Diagnosis not present

## 2024-09-07 DIAGNOSIS — I951 Orthostatic hypotension: Secondary | ICD-10-CM

## 2024-09-07 DIAGNOSIS — I480 Paroxysmal atrial fibrillation: Secondary | ICD-10-CM

## 2024-09-07 NOTE — Progress Notes (Signed)
 Cardiology Office Note   Date:  09/07/2024   ID:  Sarah, Riddle March 28, 1941, [name]  PCP:  Sarah Fredy RAMAN, MD  Cardiologist:  Sarah Fernand, MD      History of Present Illness: Sarah Riddle is a 83 y.o. female who presents for  Chief Complaint  Patient presents with   Follow-up    4 week echo results    Has migrain headaches, and recently went to ER, and workup ct, cta negative.      Past Medical History:  Diagnosis Date   A-fib [facility])    Accelerated hypertension 10/31/2022   Age related osteoporosis    Cancer (HCC)    Melanoma x 2 -- Lt Leg -1976 Rt Arm - 2014   Chronic bilateral low back pain    Chronic kidney disease    Complication of anesthesia    DVT (deep venous thrombosis) (HCC) 1960   right leg  was on BC pills   Dysrhythmia    Family history of breast cancer    Family history of colon cancer    Family history of melanoma    Family history of prostate cancer    Fibrocystic breast disease    GERD (gastroesophageal reflux disease)    Headache    migraine   Heart murmur    not heard now.   History of kidney stones    Hyperlipidemia    Hypertension    IBS (irritable bowel syndrome)    Lumbago    Mitral valve prolapse    Neuropathy    Obstructive sleep apnea    Osteoarthritis of shoulder    PONV (postoperative nausea and vomiting)      Past Surgical History:  Procedure Laterality Date   ABDOMINAL HYSTERECTOMY     BACK SURGERY     BREAST BIOPSY Left 04/20/2023   us  bx/ heart clip/ path pending   BREAST BIOPSY Left 04/20/2023   US  LT BREAST BX W LOC DEV 1ST LESION IMG BX SPEC US  GUIDE 04/20/2023 [facility]   BREAST CYST ASPIRATION Left 2001   Negative   CARDIAC ELECTROPHYSIOLOGY MAPPING AND ABLATION     EYE SURGERY     HUMERUS IM NAIL Right 05/05/2022   Procedure: INTRAMEDULLARY (IM) NAIL HUMERAL;  Surgeon: Sarah Franky SQUIBB, MD;  Location: [facility] OR;  Service: Orthopedics;  Laterality: Right;   HYSTEROTOMY     L4-L5   2020   titaum rebok cage   LEFT HEART CATH AND CORONARY ANGIOGRAPHY N/A 11/01/2022   Procedure: LEFT HEART CATH AND CORONARY ANGIOGRAPHY and possible PCI and stent;  Surgeon: Sarah Sarah LABOR, MD;  Location: [facility] INVASIVE CV LAB;  Service: Cardiovascular;  Laterality: N/A;   TOTAL HIP ARTHROPLASTY Left      Current Outpatient Medications  Medication Sig Dispense Refill   acetaminophen  (TYLENOL ) 500 MG tablet Take 500 mg by mouth every 6 (six) hours as needed for moderate pain (pain score 4-6) or mild pain (pain score 1-3). Once a day     alendronate  (FOSAMAX ) 70 MG tablet TAKE 1 TABLET WKEELY EVERY THURSDAY. TAKE WITH A FULL GLASS OF WATER ON AN EMPTY STOMACH. 12 tablet 3   allopurinol  (ZYLOPRIM ) 100 MG tablet TAKE 1 TABLET EVERY DAY 90 tablet 3   apixaban  (ELIQUIS ) 5 MG TABS tablet Take 1 tablet (5 mg total) by mouth 2 (two) times daily. 60 tablet 0   azelastine  (ASTELIN ) 0.1 % nasal spray Place 2 sprays into both nostrils at bedtime. Use  in each nostril as directed (Patient taking differently: Place 2 sprays into both nostrils as needed for allergies. Use in each nostril as directed) 30 mL 12   carboxymethylcellulose (REFRESH PLUS) 0.5 % SOLN Place 1 drop into both eyes 3 (three) times daily as needed.     cyanocobalamin  (VITAMIN B12) 1000 MCG tablet Take 1,000 mcg by mouth daily. Monday to Friday     DULoxetine  (CYMBALTA ) 30 MG capsule TAKE 1 CAPSULE BY MOUTH ONCE DAILY 90 capsule 3   EPINEPHrine  0.3 mg/0.3 mL IJ SOAJ injection Inject 0.3 mg into the muscle as needed for anaphylaxis.     fluticasone  (FLONASE ) 50 MCG/ACT nasal spray Place 2 sprays into both nostrils daily as needed for allergies or rhinitis. 3 mL 3   furosemide  (LASIX ) 20 MG tablet Take 20 mg by mouth 3 (three) times a week. Monday, Wednesday, Friday and PRN for weight gain of 1-2 pounds in one day     gabapentin  (NEURONTIN ) 800 MG tablet TAKE 1 TABLET FOUR TIMES DAILY 360 tablet 3   hydrALAZINE  (APRESOLINE ) 50 MG tablet TAKE  1 TABLET THREE TIMES DAILY 270 tablet 3   levocetirizine (XYZAL ) 5 MG tablet TAKE 1 TABLET EVERY EVENING 90 tablet 3   losartan  (COZAAR ) 100 MG tablet Take 1 tablet (100 mg total) by mouth daily. 30 tablet 11   meclizine  (ANTIVERT ) 12.5 MG tablet Take 1 tablet (12.5 mg total) by mouth 3 (three) times daily as needed for dizziness. 30 tablet 0   metoprolol  succinate (TOPROL -XL) 25 MG 24 hr tablet TAKE 1 TABLET EVERY DAY 90 tablet 3   nitroGLYCERIN  (NITROSTAT ) 0.4 MG SL tablet Place 1 tablet (0.4 mg total) under the tongue every 5 (five) minutes as needed for chest pain. 25 tablet 0   ondansetron  (ZOFRAN -ODT) 4 MG disintegrating tablet Take 1 tablet (4 mg total) by mouth every 8 (eight) hours as needed for nausea or vomiting. 20 tablet 0   OXYGEN  Inhale 2 L/min into the lungs at bedtime. And as needed for SOB     pantoprazole  (PROTONIX ) 40 MG tablet TAKE 1 TABLET EVERY DAY 90 tablet 3   rosuvastatin  (CRESTOR ) 40 MG tablet TAKE 1 TABLET EVERY DAY 90 tablet 3   Vitamin D , Ergocalciferol , 50000 units CAPS Take 1 capsule by mouth once a week. 12 capsule 3   No current facility-administered medications for this visit.    Allergies:   Iodinated contrast media, Ibuprofen, Sulfa antibiotics, Doxycycline, and Tetracyclines & related    Social History:   reports that she has never smoked. She has never used smokeless tobacco. She reports that she does not drink alcohol  and does not use drugs.   Family History:  family history includes Acute myelogenous leukemia in her sister; Breast cancer in an other family member; Cancer in her sister; Colon cancer (age of onset: 1) in her mother; HIV/AIDS (age of onset: 53) in her brother; Melanoma in her brother, brother, niece, and another family member; Prostate cancer in her brother, brother, and father; Tuberculosis in her paternal aunt.    ROS:     Review of Systems  Constitutional: Negative.   HENT: Negative.    Eyes: Negative.   Respiratory: Negative.     Gastrointestinal: Negative.   Genitourinary: Negative.   Musculoskeletal: Negative.   Skin: Negative.   Neurological: Negative.   Endo/Heme/Allergies: Negative.   Psychiatric/Behavioral: Negative.    All other systems reviewed and are negative.     All other systems are reviewed and negative.  PHYSICAL EXAM: VS:  BP 128/60   Pulse 69   Ht 5' 11 (1.803 m)   Wt 186 lb 12.8 oz (84.7 kg)   SpO2 98%   BMI 26.05 kg/m  , BMI Body mass index is 26.05 kg/m. Last weight:  Wt Readings from Last 3 Encounters:  09/07/24 186 lb 12.8 oz (84.7 kg)  09/06/24 187 lb (84.8 kg)  09/04/24 186 lb (84.4 kg)     Physical Exam Constitutional:      Appearance: Normal appearance.  Cardiovascular:     Rate and Rhythm: Normal rate and regular rhythm.     Heart sounds: Normal heart sounds.  Pulmonary:     Effort: Pulmonary effort is normal.     Breath sounds: Normal breath sounds.  Musculoskeletal:     Right lower leg: No edema.     Left lower leg: No edema.  Neurological:     Mental Status: She is alert.       EKG:   Recent Labs: 02/13/2024: B Natriuretic Peptide 730.3 02/15/2024: Magnesium 2.0 06/26/2024: TSH 0.762 09/04/2024: ALT 8; BUN 15; Creatinine, Ser 0.74; Hemoglobin 11.6; Platelets 186; Potassium 3.4; Sodium 142    Lipid Panel    Component Value Date/Time   CHOL 121 06/26/2024 0839   TRIG 93 06/26/2024 0839   HDL 53 06/26/2024 0839   CHOLHDL 2.3 06/26/2024 0839   LDLCALC 50 06/26/2024 0839      Other studies Reviewed: Additional studies/ records that were reviewed today include:  Review of the above records demonstrates:       No data to display            ASSESSMENT AND PLAN:    ICD-10-CM   1. Acute on chronic diastolic CHF (congestive heart failure) (HCC)  I50.33    LVEF 50-55< diastolic dysfunction    2. Coronary artery disease involving native coronary artery of native heart with other form of angina pectoris  I25.118     3. Essential  hypertension, benign  I10     4. PAF with history of ablation (paroxysmal atrial fibrillation) (HCC)  I48.0     5. Orthostatic hypotension  I95.1    change hydralazine  50 mg to bid and if BP low take 1/2 tab bid       Problem List Items Addressed This Visit       Cardiovascular and Mediastinum   PAF with history of ablation (paroxysmal atrial fibrillation) (HCC)   Essential hypertension, benign   Orthostatic hypotension   CAD (coronary artery disease)   Acute on chronic diastolic CHF (congestive heart failure) (HCC) - Primary       Disposition:   Return in about 4 weeks (around 10/05/2024).    Total time spent: 35 minutes  Signed,  Sarah Bathe, MD  09/07/2024 11:01 AM    [facility]

## 2024-09-13 ENCOUNTER — Other Ambulatory Visit: Payer: Self-pay | Admitting: Internal Medicine

## 2024-09-13 DIAGNOSIS — E790 Hyperuricemia without signs of inflammatory arthritis and tophaceous disease: Secondary | ICD-10-CM

## 2024-09-13 MED ORDER — ALLOPURINOL 100 MG PO TABS: 100.0000 mg | ORAL_TABLET | Freq: Every day | ORAL | 3 refills | Status: AC

## 2024-09-17 ENCOUNTER — Other Ambulatory Visit: Payer: Self-pay | Admitting: Internal Medicine

## 2024-09-17 DIAGNOSIS — I82531 Chronic embolism and thrombosis of right popliteal vein: Secondary | ICD-10-CM

## 2024-09-21 ENCOUNTER — Other Ambulatory Visit: Payer: Self-pay | Admitting: Internal Medicine

## 2024-09-21 ENCOUNTER — Other Ambulatory Visit: Payer: Self-pay | Admitting: Cardiology

## 2024-09-21 DIAGNOSIS — I25112 Atherosclerotic heart disease of native coronary artery with refractory angina pectoris: Secondary | ICD-10-CM

## 2024-10-05 ENCOUNTER — Ambulatory Visit: Admitting: Cardiovascular Disease

## 2024-10-05 ENCOUNTER — Ambulatory Visit: Admitting: Internal Medicine

## 2024-10-05 ENCOUNTER — Encounter: Payer: Self-pay | Admitting: Cardiovascular Disease

## 2024-10-05 VITALS — BP 132/72 | HR 66 | Ht 71.0 in | Wt 185.2 lb

## 2024-10-05 DIAGNOSIS — I25118 Atherosclerotic heart disease of native coronary artery with other forms of angina pectoris: Secondary | ICD-10-CM

## 2024-10-05 DIAGNOSIS — E782 Mixed hyperlipidemia: Secondary | ICD-10-CM | POA: Diagnosis not present

## 2024-10-05 DIAGNOSIS — I25112 Atherosclerotic heart disease of native coronary artery with refractory angina pectoris: Secondary | ICD-10-CM

## 2024-10-05 DIAGNOSIS — I951 Orthostatic hypotension: Secondary | ICD-10-CM | POA: Diagnosis not present

## 2024-10-05 DIAGNOSIS — I480 Paroxysmal atrial fibrillation: Secondary | ICD-10-CM

## 2024-10-05 DIAGNOSIS — I10 Essential (primary) hypertension: Secondary | ICD-10-CM | POA: Diagnosis not present

## 2024-10-05 DIAGNOSIS — I5033 Acute on chronic diastolic (congestive) heart failure: Secondary | ICD-10-CM

## 2024-10-05 DIAGNOSIS — I82531 Chronic embolism and thrombosis of right popliteal vein: Secondary | ICD-10-CM

## 2024-10-05 NOTE — Progress Notes (Signed)
 "     Cardiology Office Note   Date:  10/05/2024   ID:  Sarah Riddle 30-Nov-1940, [name]  PCP:  Sarah Fredy RAMAN, MD  Cardiologist:  Sarah Fernand, MD      History of Present Illness: Sarah Riddle is a 84 y.o. female who presents for  Chief Complaint  Patient presents with   Follow-up    4 week follow up    No chest pain or SOB.      Past Medical History:  Diagnosis Date   A-fib [facility])    Accelerated hypertension 10/31/2022   Age related osteoporosis    Cancer (HCC)    Melanoma x 2 -- Lt Leg -1976 Rt Arm - 2014   Chronic bilateral low back pain    Chronic kidney disease    Complication of anesthesia    DVT (deep venous thrombosis) (HCC) 1960   right leg  was on BC pills   Dysrhythmia    Family history of breast cancer    Family history of colon cancer    Family history of melanoma    Family history of prostate cancer    Fibrocystic breast disease    GERD (gastroesophageal reflux disease)    Headache    migraine   Heart murmur    not heard now.   History of kidney stones    Hyperlipidemia    Hypertension    IBS (irritable bowel syndrome)    Lumbago    Mitral valve prolapse    Neuropathy    Obstructive sleep apnea    Osteoarthritis of shoulder    PONV (postoperative nausea and vomiting)      Past Surgical History:  Procedure Laterality Date   ABDOMINAL HYSTERECTOMY     BACK SURGERY     BREAST BIOPSY Left 04/20/2023   us  bx/ heart clip/ path pending   BREAST BIOPSY Left 04/20/2023   US  LT BREAST BX W LOC DEV 1ST LESION IMG BX SPEC US  GUIDE 04/20/2023 [facility]   BREAST CYST ASPIRATION Left 2001   Negative   CARDIAC ELECTROPHYSIOLOGY MAPPING AND ABLATION     EYE SURGERY     HUMERUS IM NAIL Right 05/05/2022   Procedure: INTRAMEDULLARY (IM) NAIL HUMERAL;  Surgeon: Sarah Franky SQUIBB, MD;  Location: [facility] OR;  Service: Orthopedics;  Laterality: Right;   HYSTEROTOMY     L4-L5  2020   titaum rebok cage   LEFT HEART CATH AND CORONARY  ANGIOGRAPHY N/A 11/01/2022   Procedure: LEFT HEART CATH AND CORONARY ANGIOGRAPHY and possible PCI and stent;  Surgeon: Sarah Sarah LABOR, MD;  Location: [facility] INV[facility] CV LAB;  Service: Cardiovascular;  Laterality: N/A;   TOTAL HIP ARTHROPLASTY Left      Current Outpatient Medications  Medication Sig Dispense Refill   acetaminophen  (TYLENOL ) 500 MG tablet Take 500 mg by mouth every 6 (six) hours as needed for moderate pain (pain score 4-6) or mild pain (pain score 1-3). Once a day     alendronate  (FOSAMAX ) 70 MG tablet TAKE 1 TABLET WKEELY EVERY THURSDAY. TAKE WITH A FULL GLASS OF WATER ON AN EMPTY STOMACH. 12 tablet 3   allopurinol  (ZYLOPRIM ) 100 MG tablet Take 1 tablet (100 mg total) by mouth daily. 90 tablet 3   carboxymethylcellulose (REFRESH PLUS) 0.5 % SOLN Place 1 drop into both eyes 3 (three) times daily as needed.     cyanocobalamin  (VITAMIN B12) 1000 MCG tablet Take 1,000 mcg by mouth daily. Monday to Friday  DULoxetine  (CYMBALTA ) 30 MG capsule TAKE 1 CAPSULE BY MOUTH ONCE DAILY 90 capsule 3   ELIQUIS  5 MG TABS tablet TAKE 1 TABLET TWICE DAILY 180 tablet 3   EPINEPHrine  0.3 mg/0.3 mL IJ SOAJ injection Inject 0.3 mg into the muscle as needed for anaphylaxis.     fluticasone  (FLONASE ) 50 MCG/ACT nasal spray Place 2 sprays into both nostrils daily as needed for allergies or rhinitis. 3 mL 3   furosemide  (LASIX ) 20 MG tablet TAKE 1 TABLET TWICE DAILY AS NEEDED FOR FLUID OR EDEMA 60 tablet 11   gabapentin  (NEURONTIN ) 800 MG tablet TAKE 1 TABLET FOUR TIMES DAILY 360 tablet 3   hydrALAZINE  (APRESOLINE ) 50 MG tablet TAKE 1 TABLET THREE TIMES DAILY 270 tablet 3   levocetirizine (XYZAL ) 5 MG tablet TAKE 1 TABLET EVERY EVENING 90 tablet 3   losartan  (COZAAR ) 100 MG tablet Take 1 tablet (100 mg total) by mouth daily. 30 tablet 11   meclizine  (ANTIVERT ) 12.5 MG tablet Take 1 tablet (12.5 mg total) by mouth 3 (three) times daily as needed for dizziness. 30 tablet 0   metoprolol  succinate  (TOPROL -XL) 25 MG 24 hr tablet TAKE 1 TABLET EVERY DAY 90 tablet 3   nitroGLYCERIN  (NITROSTAT ) 0.4 MG SL tablet PLACE 1 TABLET (0.4 MG TOTAL) UNDER THE TONGUE EVERY 5 (FIVE) MINUTES AS NEEDED FOR CHEST PAIN. 25 tablet 11   ondansetron  (ZOFRAN -ODT) 4 MG disintegrating tablet Take 1 tablet (4 mg total) by mouth every 8 (eight) hours as needed for nausea or vomiting. 20 tablet 0   OXYGEN  Inhale 2 L/min into the lungs at bedtime. And as needed for SOB     pantoprazole  (PROTONIX ) 40 MG tablet TAKE 1 TABLET EVERY DAY 90 tablet 3   rosuvastatin  (CRESTOR ) 40 MG tablet TAKE 1 TABLET EVERY DAY 90 tablet 3   Vitamin D , Ergocalciferol , 50000 units CAPS Take 1 capsule by mouth once a week. 12 capsule 3   No current facility-administered medications for this visit.    Allergies:   Iodinated contrast media, Ibuprofen, Sulfa antibiotics, Doxycycline, and Tetracyclines & related    Social History:   reports that she has never smoked. She has never used smokeless tobacco. She reports that she does not drink alcohol  and does not use drugs.   Family History:  family history includes Acute myelogenous leukemia in her sister; Breast cancer in an other family member; Cancer in her sister; Colon cancer (age of onset: 17) in her mother; HIV/AIDS (age of onset: 12) in her brother; Melanoma in her brother, brother, niece, and another family member; Prostate cancer in her brother, brother, and father; Tuberculosis in her paternal aunt.    ROS:     Review of Systems  Constitutional: Negative.   HENT: Negative.    Eyes: Negative.   Respiratory: Negative.    Gastrointestinal: Negative.   Genitourinary: Negative.   Musculoskeletal: Negative.   Skin: Negative.   Neurological: Negative.   Endo/Heme/Allergies: Negative.   Psychiatric/Behavioral: Negative.    All other systems reviewed and are negative.     All other systems are reviewed and negative.    PHYSICAL EXAM: VS:  BP 132/72   Pulse 66   Ht 5' 11  (1.803 m)   Wt 185 lb 3.2 oz (84 kg)   SpO2 98%   BMI 25.83 kg/m  , BMI Body mass index is 25.83 kg/m. Last weight:  Wt Readings from Last 3 Encounters:  10/05/24 185 lb 3.2 oz (84 kg)  09/07/24 186  lb 12.8 oz (84.7 kg)  09/06/24 187 lb (84.8 kg)     Physical Exam Constitutional:      Appearance: Normal appearance.  Cardiovascular:     Rate and Rhythm: Normal rate and regular rhythm.     Heart sounds: Normal heart sounds.  Pulmonary:     Effort: Pulmonary effort is normal.     Breath sounds: Normal breath sounds.  Musculoskeletal:     Right lower leg: No edema.     Left lower leg: No edema.  Neurological:     Mental Status: She is alert.       EKG:   Recent Labs: 02/13/2024: B Natriuretic Peptide 730.3 02/15/2024: Magnesium 2.0 06/26/2024: TSH 0.762 09/04/2024: ALT 8; BUN 15; Creatinine, Ser 0.74; Hemoglobin 11.6; Platelets 186; Potassium 3.4; Sodium 142    Lipid Panel    Component Value Date/Time   CHOL 121 06/26/2024 0839   TRIG 93 06/26/2024 0839   HDL 53 06/26/2024 0839   CHOLHDL 2.3 06/26/2024 0839   LDLCALC 50 06/26/2024 0839      Other studies Reviewed: Additional studies/ records that were reviewed today include:  Review of the above records demonstrates:       No data to display            ASSESSMENT AND PLAN:    ICD-10-CM   1. Acute on chronic diastolic CHF (congestive heart failure) (HCC)  I50.33    compensated.    2. Coronary artery disease involving native coronary artery of native heart with other form of angina pectoris  I25.118     3. Essential hypertension, benign  I10     4. Primary hypertension  I10     5. Mixed hyperlipidemia  E78.2     6. PAF with history of ablation (paroxysmal atrial fibrillation) (HCC)  I48.0     7. Orthostatic hypotension  I95.1     8. Coronary artery disease involving native coronary artery of native heart with refractory angina pectoris  I25.112     9. Chronic deep vein thrombosis (DVT) of  popliteal vein of right lower extremity (HCC)  I82.531        Problem List Items Addressed This Visit       Cardiovascular and Mediastinum   Hypertension   PAF with history of ablation (paroxysmal atrial fibrillation) (HCC)   Essential hypertension, benign   Orthostatic hypotension   CAD (coronary artery disease)   Acute on chronic diastolic CHF (congestive heart failure) (HCC) - Primary     Other   HLD (hyperlipidemia)   Other Visit Diagnoses       Chronic deep vein thrombosis (DVT) of popliteal vein of right lower extremity (HCC)              Disposition:   Return in about 2 months (around 12/03/2024).    Total time spent: 35 minutes  Signed,  Sarah Bathe, MD  10/05/2024 10:59 AM    [facility] "

## 2024-10-10 ENCOUNTER — Other Ambulatory Visit: Payer: Self-pay | Admitting: Internal Medicine

## 2024-10-24 ENCOUNTER — Other Ambulatory Visit: Payer: Self-pay | Admitting: Cardiovascular Disease

## 2024-10-31 ENCOUNTER — Other Ambulatory Visit: Payer: Self-pay | Admitting: Internal Medicine

## 2024-11-08 ENCOUNTER — Ambulatory Visit: Admitting: Internal Medicine

## 2024-11-12 ENCOUNTER — Inpatient Hospital Stay

## 2024-11-13 ENCOUNTER — Ambulatory Visit: Admitting: Internal Medicine

## 2024-11-19 ENCOUNTER — Ambulatory Visit: Admitting: Oncology

## 2024-12-04 ENCOUNTER — Ambulatory Visit: Admitting: Cardiovascular Disease
